# Patient Record
Sex: Female | Born: 1965
Health system: Southern US, Community
[De-identification: ages and names within clinical notes are randomized; demographics above are authoritative.]

## PROBLEM LIST (undated history)

## (undated) DIAGNOSIS — R0902 Hypoxemia: Secondary | ICD-10-CM

## (undated) DIAGNOSIS — F32A Depression, unspecified: Secondary | ICD-10-CM

## (undated) DIAGNOSIS — Z8489 Family history of other specified conditions: Secondary | ICD-10-CM

## (undated) DIAGNOSIS — E785 Hyperlipidemia, unspecified: Secondary | ICD-10-CM

## (undated) DIAGNOSIS — Z789 Other specified health status: Secondary | ICD-10-CM

## (undated) DIAGNOSIS — R002 Palpitations: Secondary | ICD-10-CM

## (undated) DIAGNOSIS — K7460 Unspecified cirrhosis of liver: Secondary | ICD-10-CM

## (undated) DIAGNOSIS — I10 Essential (primary) hypertension: Secondary | ICD-10-CM

## (undated) DIAGNOSIS — F419 Anxiety disorder, unspecified: Secondary | ICD-10-CM

## (undated) DIAGNOSIS — I499 Cardiac arrhythmia, unspecified: Secondary | ICD-10-CM

## (undated) DIAGNOSIS — S92902A Unspecified fracture of left foot, initial encounter for closed fracture: Secondary | ICD-10-CM

## (undated) DIAGNOSIS — R062 Wheezing: Secondary | ICD-10-CM

## (undated) DIAGNOSIS — H269 Unspecified cataract: Secondary | ICD-10-CM

## (undated) DIAGNOSIS — F329 Major depressive disorder, single episode, unspecified: Secondary | ICD-10-CM

## (undated) DIAGNOSIS — R51 Headache: Secondary | ICD-10-CM

## (undated) DIAGNOSIS — G4730 Sleep apnea, unspecified: Secondary | ICD-10-CM

## (undated) DIAGNOSIS — E119 Type 2 diabetes mellitus without complications: Secondary | ICD-10-CM

## (undated) DIAGNOSIS — R519 Headache, unspecified: Secondary | ICD-10-CM

## (undated) DIAGNOSIS — G709 Myoneural disorder, unspecified: Secondary | ICD-10-CM

## (undated) DIAGNOSIS — R609 Edema, unspecified: Secondary | ICD-10-CM

## (undated) HISTORY — DX: Unspecified fracture of left foot, initial encounter for closed fracture: S92.902A

## (undated) HISTORY — DX: Unspecified cataract: H26.9

## (undated) HISTORY — DX: Hypoxemia: R09.02

## (undated) HISTORY — DX: Anxiety disorder, unspecified: F41.9

## (undated) HISTORY — PX: OTHER SURGICAL HISTORY: SHX169

## (undated) HISTORY — PX: ABDOMINAL HYSTERECTOMY: SHX81

## (undated) HISTORY — DX: Hyperlipidemia, unspecified: E78.5

## (undated) HISTORY — PX: COLONOSCOPY: SHX174

---

## 2001-06-16 ENCOUNTER — Encounter: Payer: Self-pay | Admitting: Internal Medicine

## 2003-06-13 ENCOUNTER — Encounter: Payer: Self-pay | Admitting: Internal Medicine

## 2004-06-30 ENCOUNTER — Emergency Department: Payer: Self-pay | Admitting: Emergency Medicine

## 2004-06-30 ENCOUNTER — Other Ambulatory Visit: Payer: Self-pay

## 2004-07-01 ENCOUNTER — Ambulatory Visit: Payer: Self-pay | Admitting: Internal Medicine

## 2004-09-25 ENCOUNTER — Ambulatory Visit: Payer: Self-pay | Admitting: Internal Medicine

## 2004-09-28 ENCOUNTER — Emergency Department: Payer: Self-pay | Admitting: Emergency Medicine

## 2004-09-29 ENCOUNTER — Ambulatory Visit: Payer: Self-pay | Admitting: Emergency Medicine

## 2004-09-29 ENCOUNTER — Ambulatory Visit: Payer: Self-pay | Admitting: Internal Medicine

## 2004-09-30 ENCOUNTER — Ambulatory Visit: Payer: Self-pay | Admitting: Internal Medicine

## 2004-10-01 ENCOUNTER — Ambulatory Visit: Payer: Self-pay | Admitting: Internal Medicine

## 2004-10-02 ENCOUNTER — Ambulatory Visit: Payer: Self-pay | Admitting: Internal Medicine

## 2004-10-03 ENCOUNTER — Ambulatory Visit: Payer: Self-pay | Admitting: Gastroenterology

## 2004-10-06 ENCOUNTER — Ambulatory Visit: Payer: Self-pay | Admitting: Gastroenterology

## 2004-10-06 ENCOUNTER — Inpatient Hospital Stay (HOSPITAL_COMMUNITY): Admission: AD | Admit: 2004-10-06 | Discharge: 2004-10-09 | Payer: Self-pay | Admitting: Internal Medicine

## 2004-10-08 ENCOUNTER — Ambulatory Visit: Payer: Self-pay | Admitting: Internal Medicine

## 2004-10-14 ENCOUNTER — Ambulatory Visit: Payer: Self-pay | Admitting: Internal Medicine

## 2004-10-21 ENCOUNTER — Ambulatory Visit: Payer: Self-pay | Admitting: Gastroenterology

## 2004-10-21 ENCOUNTER — Ambulatory Visit: Payer: Self-pay | Admitting: Internal Medicine

## 2004-10-23 ENCOUNTER — Ambulatory Visit: Payer: Self-pay | Admitting: Gastroenterology

## 2004-10-23 ENCOUNTER — Encounter (INDEPENDENT_AMBULATORY_CARE_PROVIDER_SITE_OTHER): Payer: Self-pay

## 2004-10-23 ENCOUNTER — Encounter (INDEPENDENT_AMBULATORY_CARE_PROVIDER_SITE_OTHER): Payer: Self-pay | Admitting: *Deleted

## 2004-10-24 ENCOUNTER — Ambulatory Visit (HOSPITAL_COMMUNITY): Admission: RE | Admit: 2004-10-24 | Discharge: 2004-10-24 | Payer: Self-pay | Admitting: Gastroenterology

## 2004-10-28 ENCOUNTER — Ambulatory Visit: Payer: Self-pay | Admitting: Internal Medicine

## 2004-12-02 ENCOUNTER — Ambulatory Visit: Payer: Self-pay | Admitting: Gastroenterology

## 2005-01-05 ENCOUNTER — Ambulatory Visit: Payer: Self-pay | Admitting: Internal Medicine

## 2005-03-06 ENCOUNTER — Ambulatory Visit: Payer: Self-pay | Admitting: Internal Medicine

## 2005-05-19 ENCOUNTER — Ambulatory Visit: Payer: Self-pay | Admitting: Family Medicine

## 2005-07-21 ENCOUNTER — Ambulatory Visit: Payer: Self-pay | Admitting: Internal Medicine

## 2005-11-11 ENCOUNTER — Ambulatory Visit: Payer: Self-pay | Admitting: Internal Medicine

## 2005-11-19 ENCOUNTER — Ambulatory Visit: Payer: Self-pay | Admitting: Internal Medicine

## 2005-12-04 ENCOUNTER — Ambulatory Visit: Payer: Self-pay | Admitting: Internal Medicine

## 2006-01-06 ENCOUNTER — Ambulatory Visit: Payer: Self-pay | Admitting: Internal Medicine

## 2006-03-25 ENCOUNTER — Ambulatory Visit: Payer: Self-pay | Admitting: Internal Medicine

## 2006-07-26 ENCOUNTER — Ambulatory Visit: Payer: Self-pay | Admitting: Internal Medicine

## 2007-02-03 ENCOUNTER — Encounter (INDEPENDENT_AMBULATORY_CARE_PROVIDER_SITE_OTHER): Payer: Self-pay | Admitting: *Deleted

## 2007-02-04 ENCOUNTER — Ambulatory Visit: Payer: Self-pay | Admitting: Internal Medicine

## 2007-02-04 DIAGNOSIS — J019 Acute sinusitis, unspecified: Secondary | ICD-10-CM | POA: Insufficient documentation

## 2007-02-04 DIAGNOSIS — E119 Type 2 diabetes mellitus without complications: Secondary | ICD-10-CM | POA: Insufficient documentation

## 2007-02-04 DIAGNOSIS — E1169 Type 2 diabetes mellitus with other specified complication: Secondary | ICD-10-CM | POA: Insufficient documentation

## 2007-02-04 DIAGNOSIS — R1011 Right upper quadrant pain: Secondary | ICD-10-CM | POA: Insufficient documentation

## 2007-02-04 DIAGNOSIS — E785 Hyperlipidemia, unspecified: Secondary | ICD-10-CM | POA: Insufficient documentation

## 2007-02-07 ENCOUNTER — Encounter: Payer: Self-pay | Admitting: Internal Medicine

## 2007-02-08 ENCOUNTER — Emergency Department: Payer: Self-pay | Admitting: Emergency Medicine

## 2007-02-08 ENCOUNTER — Telehealth: Payer: Self-pay | Admitting: Internal Medicine

## 2007-02-14 ENCOUNTER — Ambulatory Visit: Payer: Self-pay | Admitting: Emergency Medicine

## 2007-05-02 ENCOUNTER — Encounter: Payer: Self-pay | Admitting: Internal Medicine

## 2007-05-02 DIAGNOSIS — Z862 Personal history of diseases of the blood and blood-forming organs and certain disorders involving the immune mechanism: Secondary | ICD-10-CM | POA: Insufficient documentation

## 2007-05-02 DIAGNOSIS — K5900 Constipation, unspecified: Secondary | ICD-10-CM

## 2007-05-02 DIAGNOSIS — L408 Other psoriasis: Secondary | ICD-10-CM | POA: Insufficient documentation

## 2007-05-02 DIAGNOSIS — Z8639 Personal history of other endocrine, nutritional and metabolic disease: Secondary | ICD-10-CM

## 2007-05-02 DIAGNOSIS — D259 Leiomyoma of uterus, unspecified: Secondary | ICD-10-CM | POA: Insufficient documentation

## 2007-05-02 DIAGNOSIS — Z85858 Personal history of malignant neoplasm of other endocrine glands: Secondary | ICD-10-CM | POA: Insufficient documentation

## 2007-05-02 DIAGNOSIS — F32A Depression, unspecified: Secondary | ICD-10-CM | POA: Insufficient documentation

## 2007-05-02 DIAGNOSIS — G43909 Migraine, unspecified, not intractable, without status migrainosus: Secondary | ICD-10-CM | POA: Insufficient documentation

## 2007-05-02 DIAGNOSIS — F329 Major depressive disorder, single episode, unspecified: Secondary | ICD-10-CM | POA: Insufficient documentation

## 2007-05-02 DIAGNOSIS — K589 Irritable bowel syndrome without diarrhea: Secondary | ICD-10-CM | POA: Insufficient documentation

## 2007-05-02 DIAGNOSIS — I10 Essential (primary) hypertension: Secondary | ICD-10-CM | POA: Insufficient documentation

## 2007-05-02 DIAGNOSIS — K5909 Other constipation: Secondary | ICD-10-CM | POA: Insufficient documentation

## 2007-05-02 DIAGNOSIS — K649 Unspecified hemorrhoids: Secondary | ICD-10-CM | POA: Insufficient documentation

## 2007-07-18 ENCOUNTER — Telehealth (INDEPENDENT_AMBULATORY_CARE_PROVIDER_SITE_OTHER): Payer: Self-pay | Admitting: *Deleted

## 2007-07-19 ENCOUNTER — Emergency Department: Payer: Self-pay | Admitting: Emergency Medicine

## 2007-07-19 ENCOUNTER — Ambulatory Visit: Payer: Self-pay | Admitting: Family Medicine

## 2007-07-19 DIAGNOSIS — E860 Dehydration: Secondary | ICD-10-CM | POA: Insufficient documentation

## 2007-07-19 DIAGNOSIS — K5289 Other specified noninfective gastroenteritis and colitis: Secondary | ICD-10-CM | POA: Insufficient documentation

## 2007-08-17 ENCOUNTER — Ambulatory Visit: Payer: Self-pay | Admitting: Internal Medicine

## 2007-08-19 LAB — CONVERTED CEMR LAB
AST: 88 units/L — ABNORMAL HIGH (ref 0–37)
BUN: 8 mg/dL (ref 6–23)
Basophils Absolute: 0 10*3/uL (ref 0.0–0.1)
Basophils Relative: 0.5 % (ref 0.0–1.0)
Bilirubin, Direct: 0.1 mg/dL (ref 0.0–0.3)
CO2: 28 meq/L (ref 19–32)
Cholesterol: 202 mg/dL (ref 0–200)
Creatinine, Ser: 0.5 mg/dL (ref 0.4–1.2)
Direct LDL: 128.1 mg/dL
Eosinophils Absolute: 0.2 10*3/uL (ref 0.0–0.6)
Eosinophils Relative: 3.8 % (ref 0.0–5.0)
GFR calc Af Amer: 174 mL/min
HDL: 27.4 mg/dL — ABNORMAL LOW (ref >39.0)
Hemoglobin: 14.6 g/dL (ref 12.0–15.0)
Lymphocytes Relative: 23.3 % (ref 12.0–46.0)
MCV: 89.2 fL (ref 78.0–100.0)
Monocytes Absolute: 0.5 10*3/uL (ref 0.2–0.7)
Monocytes Relative: 7.4 % (ref 3.0–11.0)
Neutro Abs: 4.2 10*3/uL (ref 1.4–7.7)
Phosphorus: 3.4 mg/dL (ref 2.3–4.6)
Potassium: 4.2 meq/L (ref 3.5–5.1)
Sodium: 137 meq/L (ref 135–145)
TSH: 0.87 microintl units/mL (ref 0.35–5.50)
Total CHOL/HDL Ratio: 7.4

## 2008-03-05 ENCOUNTER — Telehealth: Payer: Self-pay | Admitting: Family Medicine

## 2008-12-27 ENCOUNTER — Encounter: Payer: Self-pay | Admitting: Gastroenterology

## 2009-01-04 ENCOUNTER — Encounter: Payer: Self-pay | Admitting: Gastroenterology

## 2009-01-10 ENCOUNTER — Encounter: Payer: Self-pay | Admitting: Gastroenterology

## 2009-02-18 ENCOUNTER — Ambulatory Visit: Payer: Self-pay | Admitting: Gastroenterology

## 2009-02-18 DIAGNOSIS — R112 Nausea with vomiting, unspecified: Secondary | ICD-10-CM | POA: Insufficient documentation

## 2009-02-18 DIAGNOSIS — R1115 Cyclical vomiting syndrome unrelated to migraine: Secondary | ICD-10-CM | POA: Insufficient documentation

## 2009-02-18 DIAGNOSIS — K219 Gastro-esophageal reflux disease without esophagitis: Secondary | ICD-10-CM | POA: Insufficient documentation

## 2009-02-18 DIAGNOSIS — R079 Chest pain, unspecified: Secondary | ICD-10-CM | POA: Insufficient documentation

## 2009-03-01 ENCOUNTER — Ambulatory Visit: Payer: Self-pay | Admitting: Gastroenterology

## 2009-03-01 ENCOUNTER — Encounter: Payer: Self-pay | Admitting: Gastroenterology

## 2009-03-04 ENCOUNTER — Telehealth (INDEPENDENT_AMBULATORY_CARE_PROVIDER_SITE_OTHER): Payer: Self-pay

## 2009-03-04 DIAGNOSIS — R1310 Dysphagia, unspecified: Secondary | ICD-10-CM | POA: Insufficient documentation

## 2009-03-05 ENCOUNTER — Encounter: Payer: Self-pay | Admitting: Gastroenterology

## 2009-03-15 ENCOUNTER — Ambulatory Visit (HOSPITAL_COMMUNITY): Admission: RE | Admit: 2009-03-15 | Discharge: 2009-03-15 | Payer: Self-pay | Admitting: Gastroenterology

## 2009-03-25 ENCOUNTER — Encounter (INDEPENDENT_AMBULATORY_CARE_PROVIDER_SITE_OTHER): Payer: Self-pay

## 2009-03-25 ENCOUNTER — Telehealth (INDEPENDENT_AMBULATORY_CARE_PROVIDER_SITE_OTHER): Payer: Self-pay

## 2009-04-16 ENCOUNTER — Ambulatory Visit: Payer: Self-pay | Admitting: Gastroenterology

## 2009-12-31 ENCOUNTER — Encounter (INDEPENDENT_AMBULATORY_CARE_PROVIDER_SITE_OTHER): Payer: Self-pay | Admitting: *Deleted

## 2010-06-15 ENCOUNTER — Encounter: Payer: Self-pay | Admitting: Gastroenterology

## 2010-06-26 NOTE — Letter (Signed)
Summary: Anabel Halon letter  [place] [facility]  [address] [place], [place] [address]   Phone: [phone]  Fax: [phone]       12/31/2009 MRN: [id number]  [place],   [id number]  Dear Ms. Anola Gurney Juana Di­az, and [place] announce the retirement of Modesto Charon, M.D., from full-time practice at the [facility] office effective November 21, 2009 and his plans of returning part-time.  It is important to Dr. Council Mechanic and to our practice that you understand that [facility] has seven physicians in our office for your health care needs.  We will continue to offer the same exceptional care that you have today.    Dr. Council Mechanic has spoken to many of you about his plans for retirement and returning part-time in the fall.   We will continue to work with you through the transition to schedule appointments for you in the office and meet the high standards that [facility] is committed to.   Again, it is with great pleasure that we share the news that Dr. Council Mechanic will return to [facility] at [facility] in October of 2011 with a reduced schedule.    If you have any questions, or would like to request an appointment with one of our physicians, please call us at [phone] and press the option for Scheduling an appointment.  We take pleasure in providing you with excellent patient care and look forward to seeing you at your next office visit.  [facility] are:  Viviana Simpler, M.D. Teresa Pelton, M.D. Loura Pardon, M.D. Eliezer Lofts, M.D. Owens Loffler, M.D. Arnette Norris, M.D. We proudly welcomed Renford Dills, M.D. and Ria Bush, M.D. to the practice in July/August 2011.  Sincerely,  [facility]

## 2010-08-28 LAB — GLUCOSE, CAPILLARY: Glucose-Capillary: 247 mg/dL — ABNORMAL HIGH (ref 70–99)

## 2010-10-10 NOTE — H&P (Signed)
NAME:  Kim Hicks, Kim Hicks NO.:  [id number]   MEDICAL RECORD NO.:  [id number]          PATIENT TYPE:  INP   LOCATION:  5023                         FACILITY:  [facility]   PHYSICIAN:  Drema Pry, D.O. [facility]   DATE OF BIRTH:  08-13-1965   DATE OF ADMISSION:  10/06/2004  DATE OF DISCHARGE:                                HISTORY & PHYSICAL   PRIMARY CARE PHYSICIAN:  Dr. Viviana Simpler.   CHIEF COMPLAINT:  Persistent epigastric pain, nausea, and vomiting.   HISTORY OF PRESENT ILLNESS:  The patient is a 45 year old white female with  a past medical history of diabetes times nine years who has had recent  difficulty with epigastric and diarrhea. The patient was evaluated by Dr.  Fuller Plan and states that she has had acute onset of epigastric and also right  upper quadrant pain associated with diarrhea and some nausea since Saturday  night of one week ago. The patient states that she denies any unusual food  intake, but each time she takes anything p.o. she has crampy abdominal pain  and watery nonbloody diarrhea. The patient normally is known to be  chronically constipated. The patient denies any particular GU symptoms  including vaginal discharge, but states that she did have vaginal itching  and frequent urination one week prior to her symptoms. The patient is known  to be a diabetic and within the past three weeks she states that she has  significantly changed her diet, decreasing her carbohydrate intake and  increasing her intake of protein. She has lost approximately 10-15 pounds.  There has been no recent foreign travel. She was seen at [facility] due to her acute and fairly severe symptoms. At that time the  patient had a urine pregnancy test that was negative, a urinalysis that was  essentially unremarkable, and an abdominal ultrasound that only showed mild  enlargement of her spleen. A subsequent HIDA scan was performed which was  negative due to positive  Murphy sign during ultrasound. The patient had  persistent symptoms and then underwent CAT scan of her abdomen and pelvis  without intravenous contrast which was entirely unremarkable. During this  time period the patient states that she has had uncontrolled blood sugars  approximately in the 200s and prior to this episode blood sugars have been  fairly low in the 100s. However it should be mentioned that the patient has  had persistent problem and she may not have been able to take her diabetes  medications. In addition, during this similar time period, the patient has  had a rash underneath both her breasts, which she is currently being worked  up by Dr. Silvio Pate.   PAST MEDICAL HISTORY:  1.  Diabetes mellitus times nine years.  2.  Hypertension for six years.  3.  Chronic headaches/allergy symptoms.  4.  She is status post hysterectomy in 1999 for endometriosis.   CURRENT MEDICATIONS:  1.  Glipizide ER 10 mg once a day.  2.  Metformin 1000 mg b.i.d. Of note Metformin was DC'd during her CAT scan.  3.  Lisinopril 20 mg once a day.  4.  Phenergan p.r.n.   ALLERGIES:  TALWIN.   SOCIAL HISTORY:  She is married, has one child.  Currently lives with her  husband. The patient is a Optometrist for [employer].  Denies any tobacco or alcohol usage.   REVIEW OF SYSTEMS:  No fever or chills. No HEENT symptoms. Positive back  pain. No chest pain or shortness of breath. GI symptoms as described above.  The patient denies being sexually active in the recent past and all other  systems are negative   PHYSICAL EXAMINATION:  GENERAL: The patient is an obese, 45 year old white  female in no apparent distress.  VITAL SIGNS: On admission temperature 98.3, pulse 78, respirations 18, blood  pressure 130/76. Blood sugar was 223 and patient was 96% on room air.  HEENT: The patient is anicteric. Oropharynx is within normal limits. Mucous  membranes are moist. The patient had bull  neck. No adenopathy or  thyromegaly.  LUNGS: Clear to auscultation bilaterally. No rales, rhonchi, or wheezes. The  patient has normal respiratory effort.  CARDIAC: Regular rate and rhythm without murmurs, rubs, or gallops.  ABDOMEN: Soft. The patient had diffuse abdominal tenderness, but no rebound  or guarding. Unable to appreciate organomegaly, masses, or hernias. Normal  bowel sounds.  RECTAL EXAM: Deferred.  EXTREMITIES: No clubbing, cyanosis, or edema.  NEUROLOGIC: Cranial nerves II-XII grossly intact. No focal deficits.  SKIN: Macular erythematous patchy eruption below her breasts bilaterally.   LABORATORY DATA:  As of Oct 03, 2004, CBC showed WBC of 7.0, H&H 14.5/42.5,  platelet count 201,000. A comprehensive metabolic profile was essentially  normal with the exception of AST which is slightly elevated at 65, ALT 83.  Amylase and lipase within normal limits.   ASSESSMENT/PLAN:  1.  Abdominal pain with nausea and diarrhea.  2.  Dermatitis.  3.  Type 2 diabetes.  4.  Hypertension.   RECOMMENDATIONS:  I suspect that the patient's symptoms are most consistent  with gastroenteritis, hopefully it is resolving. The patient has had  multiple studies so far that has been negative. We will consider repeat CAT  scan of the abdomen and pelvis without contrast. We will also check stool  studies for Clostridium difficile as well as cultures and O&P. With elevated  LFTs, we will also rule out acute hepatitis and also order a CMV and Epstein-  Barr viral titers. With a rash there is a remote possibility that this may  be related to celiac sprue and the rash represents dermatitis piriformis,  but I doubt this. I will discuss this with GI whether celiac panel may be  warranted.   In terms of her rash, I believe that it is an intertriginous rash from  candida and the patient will be started on Nystatin cream t.i.d.  In respect to her diabetes, the patient's poor control may be due to her   poor intake and vomiting. The patient will be temporarily started on Lantus  10 units, put on a sliding scale q.i.d.   In terms of her hypertension, she is well controlled and will be maintained  lisinopril. Dr. Fuller Plan will also follow the patient during hospitalization  and evaluate with either a colonoscopy or other study may be helpful.      RY/MEDQ  D:  10/06/2004  T:  10/06/2004  Job:  [id number]   cc:   Viviana Simpler, M.D.

## 2010-10-10 NOTE — Discharge Summary (Signed)
NAME:  Kim Hicks, Kim Hicks NO.:  [id number]   MEDICAL RECORD NO.:  [id number]          PATIENT TYPE:  INP   LOCATION:  5023                         FACILITY:  [facility]   PHYSICIAN:  Drema Pry, D.O. [facility]   DATE OF BIRTH:  04/08/1966   DATE OF ADMISSION:  10/06/2004  DATE OF DISCHARGE:  10/09/2004                                 DISCHARGE SUMMARY   PRIMARY CARE PHYSICIAN:  Viviana Simpler, M.D.   DISCHARGE DIAGNOSES:  1.  Abdominal pain with nausea and vomiting, presumed viral gastroenteritis.  2.  Diabetes mellitus type 2.  3.  Hypertension.   DISCHARGE MEDICATIONS:  1.  Glipizide ER 5 mg once a day before a.m. meal.  2.  Lisinopril 20 mg once a day.  3.  Protonix 40 mg once a day.  4.  Reglan 5 mg #90 one tab before meals.  5.  Spectazole cream apply twice daily underneath breasts.  6.  Hold Glucophage until nausea/abdominal pain completely resolved.   DIET:  She is to follow a bland diet x3-5 days.   FOLLOWUP:  Followup instructions were given for the patient to follow up  with Dr. Silvio Pate in one week.  She is to call the office for an appointment.   PROCEDURES DURING HOSPITALIZATION:  The patient underwent EGD performed by  Dr. Henrene Pastor which showed mild esophageal inflammation, classification grade 0.  The patient was started on Protonix.   HOSPITAL COURSE:  The patient is a 45 year old white female with past  medical history of diabetes who has had recurrent episode of nausea and  vomiting for approximately 1-1/2 weeks.  Was evaluated at [facility]  with a negative HIDA scan, negative ultrasound and also a negative CAT scan  of her abdomen but due to recurrent nausea and vomiting  the patient was  admitted for further evaluation.  The patient had a repeat CAT scan at [facility] which did not show any acute abnormalities besides a 2 cm  right ovarian cyst which is likely physiologic.  It should also be noted  that she had a negative pregnancy  test at [facility].  In addition,  we reviewed her gynecologic history as she has not been sexually active in  some time and we felt that a gynecologic source of abdominal pain was  unlikely.  The patient started to improve on the day before where the  patient was starting to tolerate p.o.  The patient was started on Protonix  as well as Reglan 5 mg four times a day.   PROBLEM LIST:  1.  Abdominal pain, likely viral gastroenteritis, improved with Reglan.  The      patient is to followup with Dr. Silvio Pate in one week to follow      progression.  The patient was asked to hold her Glucophage until nausea      and abdominal pain were completely resolved.  2.  Hypertension.  Remained stable.  3.  Diabetes.  The patient was treated with Lantus 10 units during her      hospitalization.  The patient  was to use her Glipizide ER.  If the      patient has hypoglycemic episodes she is to call Dr. Alla German office.  4.  The patient had intertriginous rash underneath her breasts which was      felt likely to be Candida and was started on Spectazole cream to be      applied twice a day.   DISPOSITION:  The patient's prognosis is fair.      RY/MEDQ  D:  10/09/2004  T:  10/09/2004  Job:  [id number]   cc:   Viviana Simpler, M.D.

## 2010-10-16 ENCOUNTER — Ambulatory Visit: Payer: Self-pay

## 2010-10-28 ENCOUNTER — Ambulatory Visit: Payer: Self-pay | Admitting: Unknown Physician Specialty

## 2011-02-09 ENCOUNTER — Inpatient Hospital Stay: Payer: Self-pay | Admitting: Psychiatry

## 2011-03-25 DIAGNOSIS — F333 Major depressive disorder, recurrent, severe with psychotic symptoms: Secondary | ICD-10-CM | POA: Insufficient documentation

## 2012-11-08 ENCOUNTER — Ambulatory Visit: Payer: Self-pay | Admitting: Specialist

## 2012-12-14 ENCOUNTER — Ambulatory Visit: Payer: Self-pay | Admitting: Specialist

## 2012-12-14 LAB — BASIC METABOLIC PANEL
Anion Gap: 6 — ABNORMAL LOW (ref 7–16)
BUN: 17 mg/dL (ref 7–18)
Calcium, Total: 9.3 mg/dL (ref 8.5–10.1)
EGFR (African American): >60
EGFR (Non-African Amer.): >60
Glucose: 233 mg/dL — ABNORMAL HIGH (ref 65–99)
Osmolality: 283 (ref 275–301)

## 2012-12-14 LAB — HEMOGLOBIN: HGB: 14.7 g/dL (ref 12.0–16.0)

## 2013-01-17 ENCOUNTER — Ambulatory Visit: Payer: Self-pay | Admitting: Specialist

## 2014-02-19 ENCOUNTER — Encounter: Payer: Self-pay | Admitting: Gastroenterology

## 2014-06-22 DIAGNOSIS — F312 Bipolar disorder, current episode manic severe with psychotic features: Secondary | ICD-10-CM | POA: Diagnosis not present

## 2014-09-14 NOTE — Op Note (Signed)
PATIENT NAME:  Kim Hicks, Kim Hicks MR#:  [id number] DATE OF BIRTH:  Jul 17, 1965  DATE OF PROCEDURE:  01/17/2013  PREOPERATIVE DIAGNOSIS: 1.  Mild degenerative arthritis of acromioclavicular joint left shoulder.  2.  Severe impingement syndrome, left shoulder.   POSTOPERATIVE DIAGNOSES: 1.  Mild degenerative arthritis of acromioclavicular joint left shoulder.  2.  Severe impingement syndrome, left shoulder.   PROCEDURE:  1.  Arthroscopic partial claviculectomy left shoulder.  2.  Arthroscopic debridement and anterior acromioplasty, left shoulder.   SURGEON: Christophe Louis, M.D.   ANESTHESIA: General.   COMPLICATIONS: None.   ESTIMATED BLOOD LOSS: 50 mL.   DESCRIPTION OF PROCEDURE: Ancef 1 gram was given intravenously prior to the procedure. General anesthesia was induced. The patient is placed into the right lateral decubitus position in the usual manner for left shoulder surgery. The left shoulder is thoroughly prepped with alcohol and ChloraPrep and draped in standard sterile fashion. Traction is applied on the arm with 12 pounds longitudinal traction at about 15 degrees of abduction and 15 degrees of forward flexion. The arthroscope is inserted into the subacromial bursa and outflow is maintained anteriorly. There is seen to be moderate to severe hypertrophic synovitis present, and the full radial resector is used to remove the material, such that the rotator cuff and distal clavicle and anterior acromion can be visualized. The rotator cuff is seen to have fraying but there is no full thickness tear seen on thorough inspection. The full radial resector is used to remove the soft tissue from the undersurface of the clavicle and the undersurface of the large anterior acromial spur which is seen to be present. ArthroWand is used for further removal of the soft tissue. The acromionizer is then used to remove hypertrophic osteophyte from the inferior surface of the distal clavicle and then anterior  acromioplasty is performed using standard technique back to a normal anatomic position. Range of motion of the shoulder demonstrated excellent space for the rotator cuff.  The bursa is thoroughly irrigated multiple times. Skin edges are closed with 5-0 nylon. A soft bulky dressing is applied. The subacromial bursa had previously been injected with 25 mL of Marcaine with epinephrine and 4 mg of morphine. A sling is applied. The patient is returned to the recovery room in satisfactory condition having tolerated the procedure quite well.     ____________________________ Lucas Mallow, MD ces:dp D: 01/18/2013 07:35:58 ET T: 01/18/2013 08:53:38 ET JOB#: [id number]  cc: Lucas Mallow, MD, <Dictator> Lucas Mallow MD ELECTRONICALLY SIGNED 01/25/2013 6:48

## 2014-09-17 DIAGNOSIS — F312 Bipolar disorder, current episode manic severe with psychotic features: Secondary | ICD-10-CM | POA: Diagnosis not present

## 2014-09-30 DIAGNOSIS — J018 Other acute sinusitis: Secondary | ICD-10-CM | POA: Diagnosis not present

## 2014-09-30 DIAGNOSIS — J069 Acute upper respiratory infection, unspecified: Secondary | ICD-10-CM | POA: Diagnosis not present

## 2014-10-25 DIAGNOSIS — J0100 Acute maxillary sinusitis, unspecified: Secondary | ICD-10-CM | POA: Diagnosis not present

## 2014-11-21 DIAGNOSIS — E119 Type 2 diabetes mellitus without complications: Secondary | ICD-10-CM | POA: Diagnosis not present

## 2014-11-21 DIAGNOSIS — H7419 Adhesive middle ear disease, unspecified ear: Secondary | ICD-10-CM | POA: Diagnosis not present

## 2014-12-12 DIAGNOSIS — J3489 Other specified disorders of nose and nasal sinuses: Secondary | ICD-10-CM | POA: Diagnosis not present

## 2014-12-12 DIAGNOSIS — J329 Chronic sinusitis, unspecified: Secondary | ICD-10-CM | POA: Diagnosis not present

## 2014-12-12 DIAGNOSIS — J342 Deviated nasal septum: Secondary | ICD-10-CM | POA: Diagnosis not present

## 2014-12-12 DIAGNOSIS — H9191 Unspecified hearing loss, right ear: Secondary | ICD-10-CM | POA: Diagnosis not present

## 2014-12-12 DIAGNOSIS — J351 Hypertrophy of tonsils: Secondary | ICD-10-CM | POA: Diagnosis not present

## 2014-12-12 DIAGNOSIS — H6521 Chronic serous otitis media, right ear: Secondary | ICD-10-CM | POA: Diagnosis not present

## 2014-12-12 DIAGNOSIS — R0981 Nasal congestion: Secondary | ICD-10-CM | POA: Diagnosis not present

## 2014-12-24 DIAGNOSIS — F312 Bipolar disorder, current episode manic severe with psychotic features: Secondary | ICD-10-CM | POA: Diagnosis not present

## 2014-12-26 DIAGNOSIS — J0130 Acute sphenoidal sinusitis, unspecified: Secondary | ICD-10-CM | POA: Diagnosis not present

## 2014-12-26 DIAGNOSIS — J342 Deviated nasal septum: Secondary | ICD-10-CM | POA: Diagnosis not present

## 2014-12-26 DIAGNOSIS — J329 Chronic sinusitis, unspecified: Secondary | ICD-10-CM | POA: Diagnosis not present

## 2014-12-31 DIAGNOSIS — H6981 Other specified disorders of Eustachian tube, right ear: Secondary | ICD-10-CM | POA: Diagnosis not present

## 2014-12-31 DIAGNOSIS — R51 Headache: Secondary | ICD-10-CM | POA: Diagnosis not present

## 2014-12-31 DIAGNOSIS — H6521 Chronic serous otitis media, right ear: Secondary | ICD-10-CM | POA: Diagnosis not present

## 2015-01-16 DIAGNOSIS — E0590 Thyrotoxicosis, unspecified without thyrotoxic crisis or storm: Secondary | ICD-10-CM | POA: Diagnosis not present

## 2015-01-16 DIAGNOSIS — H908 Mixed conductive and sensorineural hearing loss, unspecified: Secondary | ICD-10-CM | POA: Diagnosis not present

## 2015-01-16 DIAGNOSIS — H6981 Other specified disorders of Eustachian tube, right ear: Secondary | ICD-10-CM | POA: Diagnosis not present

## 2015-01-16 DIAGNOSIS — Z794 Long term (current) use of insulin: Secondary | ICD-10-CM | POA: Diagnosis not present

## 2015-01-16 DIAGNOSIS — Z79899 Other long term (current) drug therapy: Secondary | ICD-10-CM | POA: Diagnosis not present

## 2015-01-16 DIAGNOSIS — E119 Type 2 diabetes mellitus without complications: Secondary | ICD-10-CM | POA: Diagnosis not present

## 2015-01-16 DIAGNOSIS — I10 Essential (primary) hypertension: Secondary | ICD-10-CM | POA: Diagnosis not present

## 2015-01-16 DIAGNOSIS — H6521 Chronic serous otitis media, right ear: Secondary | ICD-10-CM | POA: Diagnosis not present

## 2015-01-16 DIAGNOSIS — H6531 Chronic mucoid otitis media, right ear: Secondary | ICD-10-CM | POA: Diagnosis not present

## 2015-01-30 DIAGNOSIS — Z8709 Personal history of other diseases of the respiratory system: Secondary | ICD-10-CM | POA: Diagnosis not present

## 2015-01-30 DIAGNOSIS — H6691 Otitis media, unspecified, right ear: Secondary | ICD-10-CM | POA: Diagnosis not present

## 2015-01-30 DIAGNOSIS — J310 Chronic rhinitis: Secondary | ICD-10-CM | POA: Diagnosis not present

## 2015-01-30 DIAGNOSIS — J342 Deviated nasal septum: Secondary | ICD-10-CM | POA: Diagnosis not present

## 2015-01-31 DIAGNOSIS — E1165 Type 2 diabetes mellitus with hyperglycemia: Secondary | ICD-10-CM | POA: Diagnosis not present

## 2015-01-31 DIAGNOSIS — E109 Type 1 diabetes mellitus without complications: Secondary | ICD-10-CM | POA: Diagnosis not present

## 2015-01-31 DIAGNOSIS — E119 Type 2 diabetes mellitus without complications: Secondary | ICD-10-CM | POA: Diagnosis not present

## 2015-05-10 DIAGNOSIS — F333 Major depressive disorder, recurrent, severe with psychotic symptoms: Secondary | ICD-10-CM | POA: Diagnosis not present

## 2015-05-13 DIAGNOSIS — F312 Bipolar disorder, current episode manic severe with psychotic features: Secondary | ICD-10-CM | POA: Diagnosis not present

## 2015-07-05 DIAGNOSIS — F333 Major depressive disorder, recurrent, severe with psychotic symptoms: Secondary | ICD-10-CM | POA: Diagnosis not present

## 2015-07-24 DIAGNOSIS — N1330 Unspecified hydronephrosis: Secondary | ICD-10-CM | POA: Diagnosis not present

## 2015-07-24 DIAGNOSIS — N10 Acute tubulo-interstitial nephritis: Secondary | ICD-10-CM | POA: Diagnosis not present

## 2015-07-24 DIAGNOSIS — K5730 Diverticulosis of large intestine without perforation or abscess without bleeding: Secondary | ICD-10-CM | POA: Diagnosis not present

## 2015-07-24 DIAGNOSIS — Z9089 Acquired absence of other organs: Secondary | ICD-10-CM | POA: Diagnosis not present

## 2015-07-25 DIAGNOSIS — E1142 Type 2 diabetes mellitus with diabetic polyneuropathy: Secondary | ICD-10-CM | POA: Diagnosis not present

## 2015-07-25 DIAGNOSIS — E119 Type 2 diabetes mellitus without complications: Secondary | ICD-10-CM | POA: Diagnosis not present

## 2015-07-25 DIAGNOSIS — E1165 Type 2 diabetes mellitus with hyperglycemia: Secondary | ICD-10-CM | POA: Diagnosis not present

## 2015-07-25 DIAGNOSIS — E109 Type 1 diabetes mellitus without complications: Secondary | ICD-10-CM | POA: Diagnosis not present

## 2015-07-30 DIAGNOSIS — F333 Major depressive disorder, recurrent, severe with psychotic symptoms: Secondary | ICD-10-CM | POA: Diagnosis not present

## 2015-07-30 DIAGNOSIS — F332 Major depressive disorder, recurrent severe without psychotic features: Secondary | ICD-10-CM | POA: Diagnosis not present

## 2015-08-16 DIAGNOSIS — E1165 Type 2 diabetes mellitus with hyperglycemia: Secondary | ICD-10-CM | POA: Diagnosis not present

## 2015-08-16 DIAGNOSIS — E109 Type 1 diabetes mellitus without complications: Secondary | ICD-10-CM | POA: Diagnosis not present

## 2015-08-16 DIAGNOSIS — N3090 Cystitis, unspecified without hematuria: Secondary | ICD-10-CM | POA: Diagnosis not present

## 2015-10-15 DIAGNOSIS — F332 Major depressive disorder, recurrent severe without psychotic features: Secondary | ICD-10-CM | POA: Diagnosis not present

## 2016-01-15 DIAGNOSIS — F332 Major depressive disorder, recurrent severe without psychotic features: Secondary | ICD-10-CM | POA: Diagnosis not present

## 2016-02-05 DIAGNOSIS — H1031 Unspecified acute conjunctivitis, right eye: Secondary | ICD-10-CM | POA: Diagnosis not present

## 2016-02-05 DIAGNOSIS — F329 Major depressive disorder, single episode, unspecified: Secondary | ICD-10-CM | POA: Diagnosis not present

## 2016-02-05 DIAGNOSIS — E119 Type 2 diabetes mellitus without complications: Secondary | ICD-10-CM | POA: Diagnosis not present

## 2016-02-05 DIAGNOSIS — I10 Essential (primary) hypertension: Secondary | ICD-10-CM | POA: Diagnosis not present

## 2016-04-10 DIAGNOSIS — F332 Major depressive disorder, recurrent severe without psychotic features: Secondary | ICD-10-CM | POA: Diagnosis not present

## 2016-04-24 DIAGNOSIS — E109 Type 1 diabetes mellitus without complications: Secondary | ICD-10-CM | POA: Diagnosis not present

## 2016-04-24 DIAGNOSIS — I10 Essential (primary) hypertension: Secondary | ICD-10-CM | POA: Diagnosis not present

## 2016-04-24 DIAGNOSIS — E119 Type 2 diabetes mellitus without complications: Secondary | ICD-10-CM | POA: Diagnosis not present

## 2016-04-24 DIAGNOSIS — F418 Other specified anxiety disorders: Secondary | ICD-10-CM | POA: Diagnosis not present

## 2016-06-08 DIAGNOSIS — I10 Essential (primary) hypertension: Secondary | ICD-10-CM | POA: Diagnosis not present

## 2016-06-08 DIAGNOSIS — E034 Atrophy of thyroid (acquired): Secondary | ICD-10-CM | POA: Diagnosis not present

## 2016-06-08 DIAGNOSIS — R5381 Other malaise: Secondary | ICD-10-CM | POA: Diagnosis not present

## 2016-06-08 DIAGNOSIS — E784 Other hyperlipidemia: Secondary | ICD-10-CM | POA: Diagnosis not present

## 2016-06-08 DIAGNOSIS — E119 Type 2 diabetes mellitus without complications: Secondary | ICD-10-CM | POA: Diagnosis not present

## 2016-06-12 DIAGNOSIS — J218 Acute bronchiolitis due to other specified organisms: Secondary | ICD-10-CM | POA: Diagnosis not present

## 2016-06-12 DIAGNOSIS — B9789 Other viral agents as the cause of diseases classified elsewhere: Secondary | ICD-10-CM | POA: Diagnosis not present

## 2016-06-12 DIAGNOSIS — E109 Type 1 diabetes mellitus without complications: Secondary | ICD-10-CM | POA: Diagnosis not present

## 2016-06-12 DIAGNOSIS — I10 Essential (primary) hypertension: Secondary | ICD-10-CM | POA: Diagnosis not present

## 2016-07-10 DIAGNOSIS — F331 Major depressive disorder, recurrent, moderate: Secondary | ICD-10-CM | POA: Diagnosis not present

## 2016-09-15 DIAGNOSIS — B9789 Other viral agents as the cause of diseases classified elsewhere: Secondary | ICD-10-CM | POA: Diagnosis not present

## 2016-09-15 DIAGNOSIS — E1165 Type 2 diabetes mellitus with hyperglycemia: Secondary | ICD-10-CM | POA: Diagnosis not present

## 2016-09-15 DIAGNOSIS — J218 Acute bronchiolitis due to other specified organisms: Secondary | ICD-10-CM | POA: Diagnosis not present

## 2016-09-15 DIAGNOSIS — E109 Type 1 diabetes mellitus without complications: Secondary | ICD-10-CM | POA: Diagnosis not present

## 2016-09-28 DIAGNOSIS — F331 Major depressive disorder, recurrent, moderate: Secondary | ICD-10-CM | POA: Diagnosis not present

## 2016-10-07 DIAGNOSIS — F332 Major depressive disorder, recurrent severe without psychotic features: Secondary | ICD-10-CM | POA: Diagnosis not present

## 2016-10-23 DIAGNOSIS — F332 Major depressive disorder, recurrent severe without psychotic features: Secondary | ICD-10-CM | POA: Diagnosis not present

## 2016-11-20 DIAGNOSIS — F332 Major depressive disorder, recurrent severe without psychotic features: Secondary | ICD-10-CM | POA: Diagnosis not present

## 2016-12-11 DIAGNOSIS — F332 Major depressive disorder, recurrent severe without psychotic features: Secondary | ICD-10-CM | POA: Diagnosis not present

## 2016-12-28 DIAGNOSIS — F331 Major depressive disorder, recurrent, moderate: Secondary | ICD-10-CM | POA: Diagnosis not present

## 2017-03-31 DIAGNOSIS — S2020XA Contusion of thorax, unspecified, initial encounter: Secondary | ICD-10-CM | POA: Diagnosis not present

## 2017-04-19 DIAGNOSIS — F331 Major depressive disorder, recurrent, moderate: Secondary | ICD-10-CM | POA: Diagnosis not present

## 2017-05-12 DIAGNOSIS — E113393 Type 2 diabetes mellitus with moderate nonproliferative diabetic retinopathy without macular edema, bilateral: Secondary | ICD-10-CM | POA: Diagnosis not present

## 2017-06-01 ENCOUNTER — Other Ambulatory Visit: Payer: Self-pay | Admitting: Internal Medicine

## 2017-06-01 DIAGNOSIS — I10 Essential (primary) hypertension: Secondary | ICD-10-CM | POA: Diagnosis not present

## 2017-06-01 DIAGNOSIS — E109 Type 1 diabetes mellitus without complications: Secondary | ICD-10-CM | POA: Diagnosis not present

## 2017-06-01 DIAGNOSIS — R51 Headache: Principal | ICD-10-CM

## 2017-06-01 DIAGNOSIS — E119 Type 2 diabetes mellitus without complications: Secondary | ICD-10-CM | POA: Diagnosis not present

## 2017-06-01 DIAGNOSIS — R519 Headache, unspecified: Secondary | ICD-10-CM

## 2017-06-01 DIAGNOSIS — E1165 Type 2 diabetes mellitus with hyperglycemia: Secondary | ICD-10-CM | POA: Diagnosis not present

## 2017-06-05 DIAGNOSIS — J209 Acute bronchitis, unspecified: Secondary | ICD-10-CM | POA: Diagnosis not present

## 2017-06-10 ENCOUNTER — Ambulatory Visit
Admission: RE | Admit: 2017-06-10 | Discharge: 2017-06-10 | Disposition: A | Discharge status: Home / Self Care | Payer: Medicare Other | Source: Ambulatory Visit | Attending: Internal Medicine | Admitting: Internal Medicine

## 2017-06-10 DIAGNOSIS — R519 Headache, unspecified: Secondary | ICD-10-CM

## 2017-06-10 DIAGNOSIS — R51 Headache: Secondary | ICD-10-CM | POA: Diagnosis not present

## 2017-06-12 DIAGNOSIS — J0110 Acute frontal sinusitis, unspecified: Secondary | ICD-10-CM | POA: Diagnosis not present

## 2017-06-12 DIAGNOSIS — J209 Acute bronchitis, unspecified: Secondary | ICD-10-CM | POA: Diagnosis not present

## 2017-06-16 DIAGNOSIS — E119 Type 2 diabetes mellitus without complications: Secondary | ICD-10-CM | POA: Diagnosis not present

## 2017-06-16 DIAGNOSIS — R110 Nausea: Secondary | ICD-10-CM | POA: Diagnosis not present

## 2017-06-16 DIAGNOSIS — R51 Headache: Secondary | ICD-10-CM | POA: Diagnosis not present

## 2017-06-16 DIAGNOSIS — I10 Essential (primary) hypertension: Secondary | ICD-10-CM | POA: Diagnosis not present

## 2017-06-16 DIAGNOSIS — R42 Dizziness and giddiness: Secondary | ICD-10-CM | POA: Diagnosis not present

## 2017-06-16 DIAGNOSIS — R002 Palpitations: Secondary | ICD-10-CM | POA: Diagnosis not present

## 2017-07-15 DIAGNOSIS — R51 Headache: Secondary | ICD-10-CM | POA: Diagnosis not present

## 2017-07-15 DIAGNOSIS — Z79899 Other long term (current) drug therapy: Secondary | ICD-10-CM | POA: Diagnosis not present

## 2017-07-15 DIAGNOSIS — G43719 Chronic migraine without aura, intractable, without status migrainosus: Secondary | ICD-10-CM | POA: Diagnosis not present

## 2017-07-19 DIAGNOSIS — M791 Myalgia, unspecified site: Secondary | ICD-10-CM | POA: Diagnosis not present

## 2017-07-19 DIAGNOSIS — G518 Other disorders of facial nerve: Secondary | ICD-10-CM | POA: Diagnosis not present

## 2017-07-19 DIAGNOSIS — M542 Cervicalgia: Secondary | ICD-10-CM | POA: Diagnosis not present

## 2017-07-19 DIAGNOSIS — G43719 Chronic migraine without aura, intractable, without status migrainosus: Secondary | ICD-10-CM | POA: Diagnosis not present

## 2017-07-19 DIAGNOSIS — R51 Headache: Secondary | ICD-10-CM | POA: Diagnosis not present

## 2017-07-20 DIAGNOSIS — F332 Major depressive disorder, recurrent severe without psychotic features: Secondary | ICD-10-CM | POA: Diagnosis not present

## 2017-07-27 DIAGNOSIS — R748 Abnormal levels of other serum enzymes: Secondary | ICD-10-CM | POA: Diagnosis not present

## 2017-07-27 DIAGNOSIS — Z13220 Encounter for screening for lipoid disorders: Secondary | ICD-10-CM | POA: Diagnosis not present

## 2017-07-27 DIAGNOSIS — Z136 Encounter for screening for cardiovascular disorders: Secondary | ICD-10-CM | POA: Diagnosis not present

## 2017-07-27 DIAGNOSIS — Z139 Encounter for screening, unspecified: Secondary | ICD-10-CM | POA: Diagnosis not present

## 2017-07-27 DIAGNOSIS — E119 Type 2 diabetes mellitus without complications: Secondary | ICD-10-CM | POA: Diagnosis not present

## 2017-07-27 DIAGNOSIS — Z6831 Body mass index (BMI) 31.0-31.9, adult: Secondary | ICD-10-CM | POA: Diagnosis not present

## 2017-07-27 DIAGNOSIS — E782 Mixed hyperlipidemia: Secondary | ICD-10-CM | POA: Diagnosis not present

## 2017-07-27 DIAGNOSIS — I10 Essential (primary) hypertension: Secondary | ICD-10-CM | POA: Diagnosis not present

## 2017-07-27 DIAGNOSIS — R5383 Other fatigue: Secondary | ICD-10-CM | POA: Diagnosis not present

## 2017-07-27 DIAGNOSIS — E669 Obesity, unspecified: Secondary | ICD-10-CM | POA: Diagnosis not present

## 2017-07-27 DIAGNOSIS — E039 Hypothyroidism, unspecified: Secondary | ICD-10-CM | POA: Diagnosis not present

## 2017-07-27 DIAGNOSIS — Z8349 Family history of other endocrine, nutritional and metabolic diseases: Secondary | ICD-10-CM | POA: Diagnosis not present

## 2017-07-27 DIAGNOSIS — E538 Deficiency of other specified B group vitamins: Secondary | ICD-10-CM | POA: Diagnosis not present

## 2017-07-27 DIAGNOSIS — E7800 Pure hypercholesterolemia, unspecified: Secondary | ICD-10-CM | POA: Diagnosis not present

## 2017-07-27 DIAGNOSIS — D649 Anemia, unspecified: Secondary | ICD-10-CM | POA: Diagnosis not present

## 2017-08-03 DIAGNOSIS — G518 Other disorders of facial nerve: Secondary | ICD-10-CM | POA: Diagnosis not present

## 2017-08-03 DIAGNOSIS — G43719 Chronic migraine without aura, intractable, without status migrainosus: Secondary | ICD-10-CM | POA: Diagnosis not present

## 2017-08-03 DIAGNOSIS — M542 Cervicalgia: Secondary | ICD-10-CM | POA: Diagnosis not present

## 2017-08-03 DIAGNOSIS — R51 Headache: Secondary | ICD-10-CM | POA: Diagnosis not present

## 2017-08-03 DIAGNOSIS — M791 Myalgia, unspecified site: Secondary | ICD-10-CM | POA: Diagnosis not present

## 2017-08-12 DIAGNOSIS — E669 Obesity, unspecified: Secondary | ICD-10-CM | POA: Diagnosis not present

## 2017-08-12 DIAGNOSIS — Z6832 Body mass index (BMI) 32.0-32.9, adult: Secondary | ICD-10-CM | POA: Diagnosis not present

## 2017-08-12 DIAGNOSIS — E119 Type 2 diabetes mellitus without complications: Secondary | ICD-10-CM | POA: Diagnosis not present

## 2017-08-18 DIAGNOSIS — R51 Headache: Secondary | ICD-10-CM | POA: Diagnosis not present

## 2017-08-18 DIAGNOSIS — M791 Myalgia, unspecified site: Secondary | ICD-10-CM | POA: Diagnosis not present

## 2017-08-18 DIAGNOSIS — M542 Cervicalgia: Secondary | ICD-10-CM | POA: Diagnosis not present

## 2017-08-18 DIAGNOSIS — G518 Other disorders of facial nerve: Secondary | ICD-10-CM | POA: Diagnosis not present

## 2017-08-18 DIAGNOSIS — G43719 Chronic migraine without aura, intractable, without status migrainosus: Secondary | ICD-10-CM | POA: Diagnosis not present

## 2017-09-07 DIAGNOSIS — M542 Cervicalgia: Secondary | ICD-10-CM | POA: Diagnosis not present

## 2017-09-07 DIAGNOSIS — M791 Myalgia, unspecified site: Secondary | ICD-10-CM | POA: Diagnosis not present

## 2017-09-07 DIAGNOSIS — R51 Headache: Secondary | ICD-10-CM | POA: Diagnosis not present

## 2017-09-07 DIAGNOSIS — G43719 Chronic migraine without aura, intractable, without status migrainosus: Secondary | ICD-10-CM | POA: Diagnosis not present

## 2017-09-07 DIAGNOSIS — G518 Other disorders of facial nerve: Secondary | ICD-10-CM | POA: Diagnosis not present

## 2017-09-09 DIAGNOSIS — E669 Obesity, unspecified: Secondary | ICD-10-CM | POA: Diagnosis not present

## 2017-09-09 DIAGNOSIS — R5383 Other fatigue: Secondary | ICD-10-CM | POA: Diagnosis not present

## 2017-09-09 DIAGNOSIS — N390 Urinary tract infection, site not specified: Secondary | ICD-10-CM | POA: Diagnosis not present

## 2017-09-09 DIAGNOSIS — Z6832 Body mass index (BMI) 32.0-32.9, adult: Secondary | ICD-10-CM | POA: Diagnosis not present

## 2017-09-16 DIAGNOSIS — K5900 Constipation, unspecified: Secondary | ICD-10-CM | POA: Diagnosis not present

## 2017-09-20 DIAGNOSIS — J0110 Acute frontal sinusitis, unspecified: Secondary | ICD-10-CM | POA: Diagnosis not present

## 2017-09-29 DIAGNOSIS — R51 Headache: Secondary | ICD-10-CM | POA: Diagnosis not present

## 2017-09-29 DIAGNOSIS — G500 Trigeminal neuralgia: Secondary | ICD-10-CM | POA: Diagnosis not present

## 2017-09-29 DIAGNOSIS — G43719 Chronic migraine without aura, intractable, without status migrainosus: Secondary | ICD-10-CM | POA: Diagnosis not present

## 2017-09-29 DIAGNOSIS — G518 Other disorders of facial nerve: Secondary | ICD-10-CM | POA: Diagnosis not present

## 2017-09-29 DIAGNOSIS — M791 Myalgia, unspecified site: Secondary | ICD-10-CM | POA: Diagnosis not present

## 2017-09-29 DIAGNOSIS — M542 Cervicalgia: Secondary | ICD-10-CM | POA: Diagnosis not present

## 2017-09-29 DIAGNOSIS — G509 Disorder of trigeminal nerve, unspecified: Secondary | ICD-10-CM | POA: Diagnosis not present

## 2017-10-08 DIAGNOSIS — E669 Obesity, unspecified: Secondary | ICD-10-CM | POA: Diagnosis not present

## 2017-10-08 DIAGNOSIS — Z6833 Body mass index (BMI) 33.0-33.9, adult: Secondary | ICD-10-CM | POA: Diagnosis not present

## 2017-10-12 DIAGNOSIS — F332 Major depressive disorder, recurrent severe without psychotic features: Secondary | ICD-10-CM | POA: Diagnosis not present

## 2017-10-13 DIAGNOSIS — E669 Obesity, unspecified: Secondary | ICD-10-CM | POA: Diagnosis not present

## 2017-10-13 DIAGNOSIS — Z6833 Body mass index (BMI) 33.0-33.9, adult: Secondary | ICD-10-CM | POA: Diagnosis not present

## 2017-10-13 DIAGNOSIS — E118 Type 2 diabetes mellitus with unspecified complications: Secondary | ICD-10-CM | POA: Diagnosis not present

## 2017-10-20 DIAGNOSIS — M542 Cervicalgia: Secondary | ICD-10-CM | POA: Diagnosis not present

## 2017-10-20 DIAGNOSIS — R51 Headache: Secondary | ICD-10-CM | POA: Diagnosis not present

## 2017-10-20 DIAGNOSIS — G43719 Chronic migraine without aura, intractable, without status migrainosus: Secondary | ICD-10-CM | POA: Diagnosis not present

## 2017-10-20 DIAGNOSIS — M791 Myalgia, unspecified site: Secondary | ICD-10-CM | POA: Diagnosis not present

## 2017-10-20 DIAGNOSIS — G518 Other disorders of facial nerve: Secondary | ICD-10-CM | POA: Diagnosis not present

## 2017-11-04 DIAGNOSIS — R51 Headache: Secondary | ICD-10-CM | POA: Diagnosis not present

## 2017-11-04 DIAGNOSIS — M542 Cervicalgia: Secondary | ICD-10-CM | POA: Diagnosis not present

## 2017-11-04 DIAGNOSIS — G518 Other disorders of facial nerve: Secondary | ICD-10-CM | POA: Diagnosis not present

## 2017-11-04 DIAGNOSIS — M791 Myalgia, unspecified site: Secondary | ICD-10-CM | POA: Diagnosis not present

## 2017-11-04 DIAGNOSIS — G43719 Chronic migraine without aura, intractable, without status migrainosus: Secondary | ICD-10-CM | POA: Diagnosis not present

## 2017-11-08 DIAGNOSIS — N390 Urinary tract infection, site not specified: Secondary | ICD-10-CM | POA: Diagnosis not present

## 2017-11-08 DIAGNOSIS — Z6833 Body mass index (BMI) 33.0-33.9, adult: Secondary | ICD-10-CM | POA: Diagnosis not present

## 2017-11-08 DIAGNOSIS — E669 Obesity, unspecified: Secondary | ICD-10-CM | POA: Diagnosis not present

## 2017-11-08 DIAGNOSIS — R5383 Other fatigue: Secondary | ICD-10-CM | POA: Diagnosis not present

## 2017-11-18 DIAGNOSIS — R51 Headache: Secondary | ICD-10-CM | POA: Diagnosis not present

## 2017-11-18 DIAGNOSIS — G518 Other disorders of facial nerve: Secondary | ICD-10-CM | POA: Diagnosis not present

## 2017-11-18 DIAGNOSIS — M542 Cervicalgia: Secondary | ICD-10-CM | POA: Diagnosis not present

## 2017-11-18 DIAGNOSIS — G43719 Chronic migraine without aura, intractable, without status migrainosus: Secondary | ICD-10-CM | POA: Diagnosis not present

## 2017-11-18 DIAGNOSIS — M791 Myalgia, unspecified site: Secondary | ICD-10-CM | POA: Diagnosis not present

## 2017-12-07 DIAGNOSIS — E113213 Type 2 diabetes mellitus with mild nonproliferative diabetic retinopathy with macular edema, bilateral: Secondary | ICD-10-CM | POA: Diagnosis not present

## 2017-12-27 DIAGNOSIS — E113393 Type 2 diabetes mellitus with moderate nonproliferative diabetic retinopathy without macular edema, bilateral: Secondary | ICD-10-CM | POA: Diagnosis not present

## 2017-12-28 DIAGNOSIS — H25041 Posterior subcapsular polar age-related cataract, right eye: Secondary | ICD-10-CM | POA: Diagnosis not present

## 2018-01-03 ENCOUNTER — Encounter: Payer: Self-pay | Admitting: *Deleted

## 2018-01-11 ENCOUNTER — Other Ambulatory Visit: Payer: Self-pay

## 2018-01-11 ENCOUNTER — Ambulatory Visit: Payer: Medicare Other | Admitting: Anesthesiology

## 2018-01-11 ENCOUNTER — Encounter
Admission: RE | Disposition: A | Discharge status: Home / Self Care | Payer: Self-pay | Source: Ambulatory Visit | Attending: Ophthalmology

## 2018-01-11 ENCOUNTER — Encounter: Payer: Self-pay | Admitting: Ophthalmology

## 2018-01-11 ENCOUNTER — Ambulatory Visit
Admission: RE | Admit: 2018-01-11 | Discharge: 2018-01-11 | Disposition: A | Discharge status: Home / Self Care | Payer: Medicare Other | Source: Ambulatory Visit | Attending: Ophthalmology | Admitting: Ophthalmology

## 2018-01-11 DIAGNOSIS — Z90710 Acquired absence of both cervix and uterus: Secondary | ICD-10-CM | POA: Insufficient documentation

## 2018-01-11 DIAGNOSIS — M7989 Other specified soft tissue disorders: Secondary | ICD-10-CM | POA: Insufficient documentation

## 2018-01-11 DIAGNOSIS — E1136 Type 2 diabetes mellitus with diabetic cataract: Secondary | ICD-10-CM | POA: Diagnosis not present

## 2018-01-11 DIAGNOSIS — E119 Type 2 diabetes mellitus without complications: Secondary | ICD-10-CM | POA: Diagnosis not present

## 2018-01-11 DIAGNOSIS — K219 Gastro-esophageal reflux disease without esophagitis: Secondary | ICD-10-CM | POA: Insufficient documentation

## 2018-01-11 DIAGNOSIS — Z888 Allergy status to other drugs, medicaments and biological substances status: Secondary | ICD-10-CM | POA: Insufficient documentation

## 2018-01-11 DIAGNOSIS — E785 Hyperlipidemia, unspecified: Secondary | ICD-10-CM | POA: Diagnosis not present

## 2018-01-11 DIAGNOSIS — H25041 Posterior subcapsular polar age-related cataract, right eye: Secondary | ICD-10-CM | POA: Diagnosis not present

## 2018-01-11 DIAGNOSIS — G43909 Migraine, unspecified, not intractable, without status migrainosus: Secondary | ICD-10-CM | POA: Insufficient documentation

## 2018-01-11 DIAGNOSIS — H2511 Age-related nuclear cataract, right eye: Secondary | ICD-10-CM | POA: Diagnosis not present

## 2018-01-11 DIAGNOSIS — E7800 Pure hypercholesterolemia, unspecified: Secondary | ICD-10-CM | POA: Diagnosis not present

## 2018-01-11 DIAGNOSIS — I10 Essential (primary) hypertension: Secondary | ICD-10-CM | POA: Diagnosis not present

## 2018-01-11 DIAGNOSIS — E1140 Type 2 diabetes mellitus with diabetic neuropathy, unspecified: Secondary | ICD-10-CM | POA: Insufficient documentation

## 2018-01-11 DIAGNOSIS — I499 Cardiac arrhythmia, unspecified: Secondary | ICD-10-CM | POA: Insufficient documentation

## 2018-01-11 DIAGNOSIS — F329 Major depressive disorder, single episode, unspecified: Secondary | ICD-10-CM | POA: Diagnosis not present

## 2018-01-11 DIAGNOSIS — R002 Palpitations: Secondary | ICD-10-CM | POA: Diagnosis not present

## 2018-01-11 DIAGNOSIS — R062 Wheezing: Secondary | ICD-10-CM | POA: Diagnosis not present

## 2018-01-11 HISTORY — DX: Headache: R51

## 2018-01-11 HISTORY — DX: Edema, unspecified: R60.9

## 2018-01-11 HISTORY — PX: CATARACT EXTRACTION W/PHACO: SHX586

## 2018-01-11 HISTORY — DX: Major depressive disorder, single episode, unspecified: F32.9

## 2018-01-11 HISTORY — DX: Cardiac arrhythmia, unspecified: I49.9

## 2018-01-11 HISTORY — DX: Depression, unspecified: F32.A

## 2018-01-11 HISTORY — DX: Palpitations: R00.2

## 2018-01-11 HISTORY — DX: Essential (primary) hypertension: I10

## 2018-01-11 HISTORY — DX: Wheezing: R06.2

## 2018-01-11 HISTORY — DX: Headache, unspecified: R51.9

## 2018-01-11 HISTORY — DX: Myoneural disorder, unspecified: G70.9

## 2018-01-11 HISTORY — DX: Type 2 diabetes mellitus without complications: E11.9

## 2018-01-11 LAB — GLUCOSE, CAPILLARY
GLUCOSE-CAPILLARY: 279 mg/dL — AB (ref 70–99)
Glucose-Capillary: 334 mg/dL — ABNORMAL HIGH (ref 70–99)

## 2018-01-11 SURGERY — PHACOEMULSIFICATION, CATARACT, WITH IOL INSERTION
Anesthesia: Monitor Anesthesia Care | Site: Eye | Laterality: Right | Wound class: Clean

## 2018-01-11 MED ORDER — MOXIFLOXACIN HCL 0.5 % OP SOLN: 1.0000 [drp] | OPHTHALMIC | Status: DC | PRN

## 2018-01-11 MED ORDER — MOXIFLOXACIN HCL 0.5 % OP SOLN
OPHTHALMIC | Status: DC | PRN
  Administered 2018-01-11: 0.2 mL via OPHTHALMIC

## 2018-01-11 MED ORDER — CARBACHOL 0.01 % IO SOLN
INTRAOCULAR | Status: DC | PRN
  Administered 2018-01-11: 0.5 mL via INTRAOCULAR

## 2018-01-11 MED ORDER — EPINEPHRINE PF 1 MG/ML IJ SOLN
INTRAMUSCULAR | Status: AC
  Filled 2018-01-11: qty 2

## 2018-01-11 MED ORDER — MIDAZOLAM HCL 2 MG/2ML IJ SOLN
INTRAMUSCULAR | Status: DC | PRN
  Administered 2018-01-11: 2 mg via INTRAVENOUS

## 2018-01-11 MED ORDER — ARMC OPHTHALMIC DILATING DROPS
1.0000 "application " | OPHTHALMIC | Status: AC
  Administered 2018-01-11 (×3): 1 via OPHTHALMIC

## 2018-01-11 MED ORDER — ARMC OPHTHALMIC DILATING DROPS
OPHTHALMIC | Status: AC
  Administered 2018-01-11: 1 via OPHTHALMIC
  Filled 2018-01-11: qty 0.5

## 2018-01-11 MED ORDER — MIDAZOLAM HCL 2 MG/2ML IJ SOLN
INTRAMUSCULAR | Status: AC
  Filled 2018-01-11: qty 2

## 2018-01-11 MED ORDER — EPINEPHRINE PF 1 MG/ML IJ SOLN
INTRAOCULAR | Status: DC | PRN
  Administered 2018-01-11: 08:00:00 via OPHTHALMIC

## 2018-01-11 MED ORDER — SODIUM CHLORIDE 0.9 % IV SOLN
INTRAVENOUS | Status: DC
  Administered 2018-01-11 (×2): via INTRAVENOUS

## 2018-01-11 MED ORDER — MOXIFLOXACIN HCL 0.5 % OP SOLN
OPHTHALMIC | Status: AC
  Filled 2018-01-11: qty 3

## 2018-01-11 MED ORDER — LIDOCAINE HCL (PF) 4 % IJ SOLN
INTRAMUSCULAR | Status: AC
Start: 2018-01-11 — End: ?
  Filled 2018-01-11: qty 5

## 2018-01-11 MED ORDER — NA CHONDROIT SULF-NA HYALURON 40-17 MG/ML IO SOLN
INTRAOCULAR | Status: DC | PRN
  Administered 2018-01-11: 1 mL via INTRAOCULAR

## 2018-01-11 MED ORDER — POVIDONE-IODINE 5 % OP SOLN
OPHTHALMIC | Status: DC | PRN
  Administered 2018-01-11: 1 via OPHTHALMIC

## 2018-01-11 MED ORDER — POVIDONE-IODINE 5 % OP SOLN
OPHTHALMIC | Status: AC
Start: 2018-01-11 — End: ?
  Filled 2018-01-11: qty 30

## 2018-01-11 MED ORDER — LIDOCAINE HCL (PF) 4 % IJ SOLN
INTRAOCULAR | Status: DC | PRN
  Administered 2018-01-11: 4 mL via OPHTHALMIC

## 2018-01-11 MED ORDER — INSULIN ASPART 100 UNIT/ML ~~LOC~~ SOLN
7.0000 [IU] | Freq: Once | SUBCUTANEOUS | Status: AC
  Administered 2018-01-11: 7 [IU] via SUBCUTANEOUS

## 2018-01-11 MED ORDER — INSULIN ASPART 100 UNIT/ML ~~LOC~~ SOLN
SUBCUTANEOUS | Status: AC
  Filled 2018-01-11: qty 1

## 2018-01-11 MED ORDER — NA CHONDROIT SULF-NA HYALURON 40-17 MG/ML IO SOLN
INTRAOCULAR | Status: AC
Start: 2018-01-11 — End: ?
  Filled 2018-01-11: qty 1

## 2018-01-11 SURGICAL SUPPLY — 16 items
GLOVE BIO SURGEON STRL SZ8 (GLOVE) ×2 IMPLANT
GLOVE BIOGEL M 6.5 STRL (GLOVE) ×2 IMPLANT
GLOVE SURG LX 8.0 MICRO (GLOVE) ×1
GLOVE SURG LX STRL 8.0 MICRO (GLOVE) ×1 IMPLANT
GOWN STRL REUS W/ TWL LRG LVL3 (GOWN DISPOSABLE) ×2 IMPLANT
GOWN STRL REUS W/TWL LRG LVL3 (GOWN DISPOSABLE) ×4
LABEL CATARACT MEDS ST (LABEL) ×2 IMPLANT
LENS IOL TECNIS ITEC 23.0 (Intraocular Lens) ×1 IMPLANT
PACK CATARACT (MISCELLANEOUS) ×2 IMPLANT
PACK CATARACT BRASINGTON LX (MISCELLANEOUS) ×2 IMPLANT
PACK EYE AFTER SURG (MISCELLANEOUS) ×2 IMPLANT
SOL BSS BAG (MISCELLANEOUS) ×2
SOLUTION BSS BAG (MISCELLANEOUS) ×1 IMPLANT
SYR 5ML LL (SYRINGE) ×2 IMPLANT
WATER STERILE IRR 250ML POUR (IV SOLUTION) ×2 IMPLANT
WIPE NON LINTING 3.25X3.25 (MISCELLANEOUS) ×2 IMPLANT

## 2018-01-11 NOTE — Discharge Instructions (Signed)
Eye Surgery Discharge Instructions    Expect mild scratchy sensation or mild soreness. DO NOT RUB YOUR EYE!  The day of surgery:  Minimal physical activity, but bed rest is not required  No reading, computer work, or close hand work  No bending, lifting, or straining.  May watch TV  For 24 hours:  No driving, legal decisions, or alcoholic beverages  Safety precautions  Eat anything you prefer: It is better to start with liquids, then soup then solid foods.  _____ Eye patch should be worn until postoperative exam tomorrow.  ____ Solar shield eyeglasses should be worn for comfort in the sunlight/patch while sleeping  Resume all regular medications including aspirin or Coumadin if these were discontinued prior to surgery. You may shower, bathe, shave, or wash your hair. Tylenol may be taken for mild discomfort.  Call your doctor if you experience significant pain, nausea, or vomiting, fever > 101 or other signs of infection. [phone] or [phone] Specific instructions:  Follow-up Information    [name], MD Follow up.   Specialty:  Ophthalmology Why:  August 21 at 8:30am Contact information: [address] [place] [address] [phone]

## 2018-01-11 NOTE — H&P (Signed)
All labs reviewed. Abnormal studies sent to patients PCP when indicated.  Previous H&P reviewed, patient examined, there are NO CHANGES.  Kim Woodlief Porfilio8/20/20197:42 AM

## 2018-01-11 NOTE — Transfer of Care (Addendum)
Immediate Anesthesia Transfer of Care Note  Patient: Kim Hicks  Procedure(s) Performed: CATARACT EXTRACTION PHACO AND INTRAOCULAR LENS PLACEMENT (IOC) (Right Eye)  Patient Location: PACU  Anesthesia Type:MAC  Level of Consciousness: awake  Airway & Oxygen Therapy: Patient Spontanous Breathing  Post-op Assessment: Report given to RN  Post vital signs: stable  Last Vitals:  Vitals Value Taken Time  BP    Temp    Pulse    Resp    SpO2      Last Pain:  Vitals:   01/11/18 0635  TempSrc: Temporal  PainSc: 8          Complications: No apparent anesthesia complications

## 2018-01-11 NOTE — Anesthesia Preprocedure Evaluation (Addendum)
Anesthesia Evaluation  Patient identified by MRN, date of birth, ID band Patient awake    Reviewed: Allergy & Precautions, H&P , NPO status , Patient's Chart, lab work & pertinent test results  Airway Mallampati: II  TM Distance: >3 FB Neck ROM: full    Dental  (+) Teeth Intact   Pulmonary neg pulmonary ROS, neg COPD,    breath sounds clear to auscultation       Cardiovascular hypertension, negative cardio ROS  (-) dysrhythmias  Rhythm:regular Rate:Normal     Neuro/Psych  Headaches, negative neurological ROS  negative psych ROS   GI/Hepatic negative GI ROS, Neg liver ROS, GERD  ,  Endo/Other  negative endocrine ROSdiabetes  Renal/GU negative Renal ROS  negative genitourinary   Musculoskeletal   Abdominal   Peds  Hematology negative hematology ROS (+)   Anesthesia Other Findings Past Medical History: No date: Depression No date: Diabetes mellitus without complication (HCC) No date: Dysrhythmia No date: Edema No date: Headache     Comment:  MIGRAINES No date: Hypertension No date: Neuromuscular disorder (HCC)     Comment:  NEUROPATHY No date: Palpitations No date: Wheezing  Past Surgical History: No date: ABDOMINAL HYSTERECTOMY No date: COLONOSCOPY No date: RCR; Bilateral  BMI    Body Mass Index:  33.28 kg/m      Reproductive/Obstetrics negative OB ROS                            Anesthesia Physical Anesthesia Plan  ASA: III  Anesthesia Plan: MAC   Post-op Pain Management:    Induction:   PONV Risk Score and Plan:   Airway Management Planned:   Additional Equipment:   Intra-op Plan:   Post-operative Plan:   Informed Consent: I have reviewed the patients History and Physical, chart, labs and discussed the procedure including the risks, benefits and alternatives for the proposed anesthesia with the patient or authorized representative who has indicated his/her  understanding and acceptance.   Dental Advisory Given  Plan Discussed with: Anesthesiologist, CRNA and Surgeon  Anesthesia Plan Comments:        Anesthesia Quick Evaluation

## 2018-01-11 NOTE — Anesthesia Postprocedure Evaluation (Signed)
Anesthesia Post Note  Patient: Kim Hicks  Procedure(s) Performed: CATARACT EXTRACTION PHACO AND INTRAOCULAR LENS PLACEMENT (IOC) (Right Eye)  Patient location during evaluation: PACU Anesthesia Type: MAC Level of consciousness: awake and alert Pain management: pain level controlled Vital Signs Assessment: post-procedure vital signs reviewed and stable Respiratory status: spontaneous breathing, nonlabored ventilation and respiratory function stable Cardiovascular status: blood pressure returned to baseline and stable Postop Assessment: no apparent nausea or vomiting Anesthetic complications: no Comments: Pt's BG elevated to 230.  7 units SQ insulin given prior to discharge.  Pt advised to monitor BG closely today and call her PCP if BG remains elevated despite usual coverage at home.     Last Vitals:  Vitals:   01/11/18 0635 01/11/18 0816  BP: 115/65 (!) 110/47  Pulse: 95 60  Resp: 17 16  Temp: (!) 35.9 C 36.9 C  SpO2: 97% 97%    Last Pain:  Vitals:   01/11/18 0816  TempSrc: Oral  PainSc: 0-No pain                 Durenda Hurt

## 2018-01-11 NOTE — Op Note (Signed)
PREOPERATIVE DIAGNOSIS:  Nuclear sclerotic cataract of the right eye.   POSTOPERATIVE DIAGNOSIS:  NUCLEAR SCLEROTIC CATARACT RIGHT EYE   OPERATIVE PROCEDURE: Procedure(s): CATARACT EXTRACTION PHACO AND INTRAOCULAR LENS PLACEMENT (IOC)   SURGEON:  [name], MD.   ANESTHESIA:  Anesthesiologist: Durenda Hurt, MD CRNA: Marcy Siren, CRNA  1.      Managed anesthesia care. 2.      0.72m of Shugarcaine was instilled in the eye following the paracentesis.   COMPLICATIONS:  None.   TECHNIQUE:   Stop and chop   DESCRIPTION OF PROCEDURE:  The patient was examined and consented in the preoperative holding area where the aforementioned topical anesthesia was applied to the right eye and then brought back to the Operating Room where the right eye was prepped and draped in the usual sterile ophthalmic fashion and a lid speculum was placed. A paracentesis was created with the side port blade and the anterior chamber was filled with viscoelastic. A near clear corneal incision was performed with the steel keratome. A continuous curvilinear capsulorrhexis was performed with a cystotome followed by the capsulorrhexis forceps. Hydrodissection and hydrodelineation were carried out with BSS on a blunt cannula. The lens was removed in a stop and chop  technique and the remaining cortical material was removed with the irrigation-aspiration handpiece. The capsular bag was inflated with viscoelastic and the Technis ZCB00  lens was placed in the capsular bag without complication. The remaining viscoelastic was removed from the eye with the irrigation-aspiration handpiece. The wounds were hydrated. The anterior chamber was flushed with Miostat and the eye was inflated to physiologic pressure. 0.158mof Vigamox was placed in the anterior chamber. The wounds were found to be water tight. The eye was dressed with Vigamox. The patient was given protective glasses to wear throughout the day and a shield with  which to sleep tonight. The patient was also given drops with which to begin a drop regimen today and will follow-up with me in one day. Implant Name Type Inv. Item Serial No. Manufacturer Lot No. LRB No. Used  LENS IOL DIOP 23.0 - [id number] Intraocular Lens LENS IOL DIOP 23.0 [id number] AMO  Right 1   Procedure(s) with comments: CATARACT EXTRACTION PHACO AND INTRAOCULAR LENS PLACEMENT (IOC) (Right) - [place]0:57.5 AP% 15.0 CDE 8.61 Fluid Pack Lot # [id number]  Electronically signed: WiBirder Robson/20/2019 8:13 AM

## 2018-01-11 NOTE — Progress Notes (Signed)
Anesthesia aware of blood sugar. No new orders at this time.

## 2018-01-11 NOTE — Anesthesia Post-op Follow-up Note (Signed)
Anesthesia QCDR form completed.        

## 2018-02-07 DIAGNOSIS — H25042 Posterior subcapsular polar age-related cataract, left eye: Secondary | ICD-10-CM | POA: Diagnosis not present

## 2018-02-15 ENCOUNTER — Encounter: Payer: Self-pay | Admitting: Certified Registered Nurse Anesthetist

## 2018-02-15 ENCOUNTER — Ambulatory Visit
Admission: RE | Admit: 2018-02-15 | Discharge: 2018-02-15 | Disposition: A | Discharge status: Home / Self Care | Payer: Medicare Other | Source: Ambulatory Visit | Attending: Ophthalmology | Admitting: Ophthalmology

## 2018-02-15 ENCOUNTER — Encounter
Admission: RE | Disposition: A | Discharge status: Home / Self Care | Payer: Self-pay | Source: Ambulatory Visit | Attending: Ophthalmology

## 2018-02-15 ENCOUNTER — Other Ambulatory Visit: Payer: Self-pay

## 2018-02-15 ENCOUNTER — Ambulatory Visit: Payer: Medicare Other | Admitting: Certified Registered Nurse Anesthetist

## 2018-02-15 DIAGNOSIS — Z888 Allergy status to other drugs, medicaments and biological substances status: Secondary | ICD-10-CM | POA: Insufficient documentation

## 2018-02-15 DIAGNOSIS — E1140 Type 2 diabetes mellitus with diabetic neuropathy, unspecified: Secondary | ICD-10-CM | POA: Diagnosis not present

## 2018-02-15 DIAGNOSIS — Z885 Allergy status to narcotic agent status: Secondary | ICD-10-CM | POA: Diagnosis not present

## 2018-02-15 DIAGNOSIS — Z79899 Other long term (current) drug therapy: Secondary | ICD-10-CM | POA: Diagnosis not present

## 2018-02-15 DIAGNOSIS — F329 Major depressive disorder, single episode, unspecified: Secondary | ICD-10-CM | POA: Insufficient documentation

## 2018-02-15 DIAGNOSIS — Z7984 Long term (current) use of oral hypoglycemic drugs: Secondary | ICD-10-CM | POA: Diagnosis not present

## 2018-02-15 DIAGNOSIS — H2512 Age-related nuclear cataract, left eye: Secondary | ICD-10-CM | POA: Diagnosis not present

## 2018-02-15 DIAGNOSIS — E7800 Pure hypercholesterolemia, unspecified: Secondary | ICD-10-CM | POA: Insufficient documentation

## 2018-02-15 DIAGNOSIS — I10 Essential (primary) hypertension: Secondary | ICD-10-CM | POA: Insufficient documentation

## 2018-02-15 DIAGNOSIS — H25042 Posterior subcapsular polar age-related cataract, left eye: Secondary | ICD-10-CM | POA: Diagnosis not present

## 2018-02-15 DIAGNOSIS — E119 Type 2 diabetes mellitus without complications: Secondary | ICD-10-CM | POA: Diagnosis not present

## 2018-02-15 DIAGNOSIS — K219 Gastro-esophageal reflux disease without esophagitis: Secondary | ICD-10-CM | POA: Diagnosis not present

## 2018-02-15 DIAGNOSIS — E1136 Type 2 diabetes mellitus with diabetic cataract: Secondary | ICD-10-CM | POA: Insufficient documentation

## 2018-02-15 HISTORY — PX: CATARACT EXTRACTION W/PHACO: SHX586

## 2018-02-15 LAB — GLUCOSE, CAPILLARY
GLUCOSE-CAPILLARY: 313 mg/dL — AB (ref 70–99)
GLUCOSE-CAPILLARY: 326 mg/dL — AB (ref 70–99)
Glucose-Capillary: 359 mg/dL — ABNORMAL HIGH (ref 70–99)

## 2018-02-15 SURGERY — PHACOEMULSIFICATION, CATARACT, WITH IOL INSERTION
Anesthesia: Monitor Anesthesia Care | Site: Eye | Laterality: Left

## 2018-02-15 MED ORDER — MIDAZOLAM HCL 2 MG/2ML IJ SOLN
INTRAMUSCULAR | Status: AC
  Filled 2018-02-15: qty 2

## 2018-02-15 MED ORDER — INSULIN ASPART 100 UNIT/ML ~~LOC~~ SOLN
SUBCUTANEOUS | Status: AC
  Administered 2018-02-15: 4 [IU] via SUBCUTANEOUS
  Filled 2018-02-15: qty 1

## 2018-02-15 MED ORDER — LIDOCAINE HCL (PF) 4 % IJ SOLN
INTRAOCULAR | Status: DC | PRN
  Administered 2018-02-15: 2 mL via OPHTHALMIC

## 2018-02-15 MED ORDER — TETRACAINE HCL 0.5 % OP SOLN
1.0000 [drp] | OPHTHALMIC | Status: DC | PRN
  Administered 2018-02-15 (×2): 1 [drp] via OPHTHALMIC

## 2018-02-15 MED ORDER — ARMC OPHTHALMIC DILATING DROPS
1.0000 "application " | OPHTHALMIC | Status: AC
  Administered 2018-02-15 (×2): 1 via OPHTHALMIC

## 2018-02-15 MED ORDER — EPINEPHRINE PF 1 MG/ML IJ SOLN
INTRAOCULAR | Status: DC | PRN
  Administered 2018-02-15: 1 mL via OPHTHALMIC

## 2018-02-15 MED ORDER — SODIUM CHLORIDE 0.9 % IV SOLN
INTRAVENOUS | Status: DC
  Administered 2018-02-15: 11:00:00 via INTRAVENOUS

## 2018-02-15 MED ORDER — MOXIFLOXACIN HCL 0.5 % OP SOLN: 1.0000 [drp] | OPHTHALMIC | Status: DC | PRN

## 2018-02-15 MED ORDER — LIDOCAINE HCL (PF) 4 % IJ SOLN
INTRAMUSCULAR | Status: AC
  Filled 2018-02-15: qty 5

## 2018-02-15 MED ORDER — MIDAZOLAM HCL 2 MG/2ML IJ SOLN
INTRAMUSCULAR | Status: DC | PRN
  Administered 2018-02-15 (×2): 1 mg via INTRAVENOUS

## 2018-02-15 MED ORDER — NA CHONDROIT SULF-NA HYALURON 40-17 MG/ML IO SOLN
INTRAOCULAR | Status: DC | PRN
  Administered 2018-02-15: 1 mL via INTRAOCULAR

## 2018-02-15 MED ORDER — FENTANYL CITRATE (PF) 100 MCG/2ML IJ SOLN: 25.0000 ug | INTRAMUSCULAR | Status: DC | PRN

## 2018-02-15 MED ORDER — ONDANSETRON HCL 4 MG/2ML IJ SOLN: 4.0000 mg | Freq: Once | INTRAMUSCULAR | Status: DC | PRN

## 2018-02-15 MED ORDER — CARBACHOL 0.01 % IO SOLN
INTRAOCULAR | Status: DC | PRN
  Administered 2018-02-15: .5 mL via INTRAOCULAR

## 2018-02-15 MED ORDER — EPINEPHRINE PF 1 MG/ML IJ SOLN
INTRAMUSCULAR | Status: AC
  Filled 2018-02-15: qty 1

## 2018-02-15 MED ORDER — INSULIN ASPART 100 UNIT/ML ~~LOC~~ SOLN
SUBCUTANEOUS | Status: AC
  Filled 2018-02-15: qty 1

## 2018-02-15 MED ORDER — MOXIFLOXACIN HCL 0.5 % OP SOLN
OPHTHALMIC | Status: DC | PRN
  Administered 2018-02-15: .2 mL via OPHTHALMIC

## 2018-02-15 MED ORDER — INSULIN ASPART 100 UNIT/ML ~~LOC~~ SOLN
8.0000 [IU] | Freq: Once | SUBCUTANEOUS | Status: AC
  Administered 2018-02-15: 8 [IU] via SUBCUTANEOUS

## 2018-02-15 MED ORDER — POVIDONE-IODINE 5 % OP SOLN
OPHTHALMIC | Status: DC | PRN
  Administered 2018-02-15: 1 via OPHTHALMIC

## 2018-02-15 MED ORDER — INSULIN ASPART 100 UNIT/ML ~~LOC~~ SOLN
4.0000 [IU] | Freq: Once | SUBCUTANEOUS | Status: AC
  Administered 2018-02-15: 4 [IU] via SUBCUTANEOUS

## 2018-02-15 MED ORDER — POVIDONE-IODINE 5 % OP SOLN
OPHTHALMIC | Status: AC
  Filled 2018-02-15: qty 30

## 2018-02-15 MED ORDER — NA CHONDROIT SULF-NA HYALURON 40-17 MG/ML IO SOLN
INTRAOCULAR | Status: AC
  Filled 2018-02-15: qty 1

## 2018-02-15 SURGICAL SUPPLY — 16 items
GLOVE BIO SURGEON STRL SZ8 (GLOVE) ×2 IMPLANT
GLOVE BIOGEL M 6.5 STRL (GLOVE) ×2 IMPLANT
GLOVE SURG LX 8.0 MICRO (GLOVE) ×1
GLOVE SURG LX STRL 8.0 MICRO (GLOVE) ×1 IMPLANT
GOWN STRL REUS W/ TWL LRG LVL3 (GOWN DISPOSABLE) ×2 IMPLANT
GOWN STRL REUS W/TWL LRG LVL3 (GOWN DISPOSABLE) ×4
LABEL CATARACT MEDS ST (LABEL) ×2 IMPLANT
LENS IOL TECNIS ITEC 22.5 (Intraocular Lens) ×1 IMPLANT
PACK CATARACT (MISCELLANEOUS) ×2 IMPLANT
PACK CATARACT BRASINGTON LX (MISCELLANEOUS) ×2 IMPLANT
PACK EYE AFTER SURG (MISCELLANEOUS) ×2 IMPLANT
SOL BSS BAG (MISCELLANEOUS) ×2
SOLUTION BSS BAG (MISCELLANEOUS) ×1 IMPLANT
SYR 5ML LL (SYRINGE) ×2 IMPLANT
WATER STERILE IRR 250ML POUR (IV SOLUTION) ×2 IMPLANT
WIPE NON LINTING 3.25X3.25 (MISCELLANEOUS) ×2 IMPLANT

## 2018-02-15 NOTE — Anesthesia Postprocedure Evaluation (Signed)
Anesthesia Post Note  Patient: Kim Hicks  Procedure(s) Performed: CATARACT EXTRACTION PHACO AND INTRAOCULAR LENS PLACEMENT (IOC) (Left Eye)  Patient location during evaluation: Short Stay Anesthesia Type: MAC Level of consciousness: awake and alert, oriented and patient cooperative Pain management: satisfactory to patient Vital Signs Assessment: post-procedure vital signs reviewed and stable Respiratory status: spontaneous breathing, respiratory function stable and nonlabored ventilation Cardiovascular status: blood pressure returned to baseline and stable Postop Assessment: no headache and no apparent nausea or vomiting Anesthetic complications: no     Last Vitals:  Vitals:   02/15/18 1010 02/15/18 1105  BP: (!) 112/58 (!) 105/48  Pulse: 91 94  Resp: 18 16  Temp: (!) 36.1 C (!) 36.2 C  SpO2: 98% 97%    Last Pain:  Vitals:   02/15/18 1105  TempSrc: Temporal  PainSc: 0-No pain                 Eben Burow

## 2018-02-15 NOTE — Anesthesia Preprocedure Evaluation (Addendum)
Anesthesia Evaluation  Patient identified by MRN, date of birth, ID band Patient awake    Reviewed: Allergy & Precautions, H&P , NPO status , Patient's Chart, lab work & pertinent test results  Airway Mallampati: II  TM Distance: >3 FB Neck ROM: full    Dental  (+) Teeth Intact   Pulmonary neg pulmonary ROS, neg COPD,    breath sounds clear to auscultation       Cardiovascular hypertension, negative cardio ROS  (-) dysrhythmias  Rhythm:regular Rate:Normal     Neuro/Psych  Headaches, negative neurological ROS  negative psych ROS   GI/Hepatic negative GI ROS, Neg liver ROS, GERD  ,  Endo/Other  negative endocrine ROSdiabetes  Renal/GU negative Renal ROS  negative genitourinary   Musculoskeletal   Abdominal   Peds  Hematology negative hematology ROS (+)   Anesthesia Other Findings Past Medical History: No date: Depression No date: Diabetes mellitus without complication (HCC) No date: Dysrhythmia No date: Edema No date: Headache     Comment:  MIGRAINES No date: Hypertension No date: Neuromuscular disorder (HCC)     Comment:  NEUROPATHY No date: Palpitations No date: Wheezing  Past Surgical History: No date: ABDOMINAL HYSTERECTOMY No date: COLONOSCOPY No date: RCR; Bilateral  BMI    Body Mass Index:  33.28 kg/m      Reproductive/Obstetrics negative OB ROS                             Anesthesia Physical  Anesthesia Plan  ASA: III  Anesthesia Plan: MAC   Post-op Pain Management:    Induction: Intravenous  PONV Risk Score and Plan:   Airway Management Planned: Nasal Cannula  Additional Equipment:   Intra-op Plan:   Post-operative Plan:   Informed Consent: I have reviewed the patients History and Physical, chart, labs and discussed the procedure including the risks, benefits and alternatives for the proposed anesthesia with the patient or authorized representative  who has indicated his/her understanding and acceptance.   Dental Advisory Given  Plan Discussed with: Anesthesiologist, CRNA and Surgeon  Anesthesia Plan Comments:         Anesthesia Quick Evaluation

## 2018-02-15 NOTE — Transfer of Care (Signed)
Immediate Anesthesia Transfer of Care Note  Patient: Kim Hicks  Procedure(s) Performed: CATARACT EXTRACTION PHACO AND INTRAOCULAR LENS PLACEMENT (IOC) (Left Eye)  Patient Location: Short Stay  Anesthesia Type:MAC  Level of Consciousness: awake, alert , oriented and patient cooperative  Airway & Oxygen Therapy: Patient Spontanous Breathing  Post-op Assessment: Report given to RN and Post -op Vital signs reviewed and stable  Post vital signs: Reviewed and stable  Last Vitals:  Vitals Value Taken Time  BP    Temp    Pulse    Resp    SpO2      Last Pain:  Vitals:   02/15/18 1010  TempSrc: Temporal  PainSc: 0-No pain         Complications: No apparent anesthesia complications

## 2018-02-15 NOTE — Discharge Instructions (Signed)
Eye Surgery Discharge Instructions    Expect mild scratchy sensation or mild soreness. DO NOT RUB YOUR EYE!  The day of surgery:  Minimal physical activity, but bed rest is not required  No reading, computer work, or close hand work  No bending, lifting, or straining.  May watch TV  For 24 hours:  No driving, legal decisions, or alcoholic beverages  Safety precautions  Eat anything you prefer: It is better to start with liquids, then soup then solid foods.  _____ Eye patch should be worn until postoperative exam tomorrow.  ____ Solar shield eyeglasses should be worn for comfort in the sunlight/patch while sleeping  Resume all regular medications including aspirin or Coumadin if these were discontinued prior to surgery. You may shower, bathe, shave, or wash your hair. Tylenol may be taken for mild discomfort.  Call your doctor if you experience significant pain, nausea, or vomiting, fever > 101 or other signs of infection. [phone] or [phone] Specific instructions:  Follow-up Information    [name], MD Follow up on 02/16/2018.   Specialty:  Ophthalmology Why:  appointment time 10:10 AM Contact information: [address] [place] [address] [phone]

## 2018-02-15 NOTE — H&P (Signed)
All labs reviewed. Abnormal studies sent to patients PCP when indicated.  Previous H&P reviewed, patient examined, there are NO CHANGES.  Kim Brailsford Porfilio9/24/201910:34 AM

## 2018-02-15 NOTE — Anesthesia Post-op Follow-up Note (Signed)
Anesthesia QCDR form completed.        

## 2018-02-15 NOTE — Op Note (Signed)
PREOPERATIVE DIAGNOSIS:  Nuclear sclerotic cataract of the left eye.   POSTOPERATIVE DIAGNOSIS:  Nuclear sclerotic cataract of the left eye.   OPERATIVE PROCEDURE: Procedure(s): CATARACT EXTRACTION PHACO AND INTRAOCULAR LENS PLACEMENT (IOC)   SURGEON:  [name], MD.   ANESTHESIA:  Anesthesiologist: Alvin Critchley, MD CRNA: Eben Burow, CRNA  1.      Managed anesthesia care. 2.     0.64m of Shugarcaine was instilled following the paracentesis   COMPLICATIONS:  None.   TECHNIQUE:   Stop and chop   DESCRIPTION OF PROCEDURE:  The patient was examined and consented in the preoperative holding area where the aforementioned topical anesthesia was applied to the left eye and then brought back to the Operating Room where the left eye was prepped and draped in the usual sterile ophthalmic fashion and a lid speculum was placed. A paracentesis was created with the side port blade and the anterior chamber was filled with viscoelastic. A near clear corneal incision was performed with the steel keratome. A continuous curvilinear capsulorrhexis was performed with a cystotome followed by the capsulorrhexis forceps. Hydrodissection and hydrodelineation were carried out with BSS on a blunt cannula. The lens was removed in a stop and chop  technique and the remaining cortical material was removed with the irrigation-aspiration handpiece. The capsular bag was inflated with viscoelastic and the Technis ZCB00 lens was placed in the capsular bag without complication. The remaining viscoelastic was removed from the eye with the irrigation-aspiration handpiece. The wounds were hydrated. The anterior chamber was flushed with Miostat and the eye was inflated to physiologic pressure. 0.136mVigamox was placed in the anterior chamber. The wounds were found to be water tight. The eye was dressed with Vigamox. The patient was given protective glasses to wear throughout the day and a shield with which to sleep tonight.  The patient was also given drops with which to begin a drop regimen today and will follow-up with me in one day. Implant Name Type Inv. Item Serial No. Manufacturer Lot No. LRB No. Used  LENS IOL DIOP 22.5 - [id number] Intraocular Lens LENS IOL DIOP 22.5 [id number] AMO  Left 1    Procedure(s) with comments: CATARACT EXTRACTION PHACO AND INTRAOCULAR LENS PLACEMENT (IOC) (Left) - [place]:51 AP% 13.2 CDE 6.82 Fluid p ack lot # [id number]  Electronically signed: WiBirder Robson/24/2019 11:03 AM

## 2018-02-16 ENCOUNTER — Encounter: Payer: Self-pay | Admitting: Ophthalmology

## 2018-03-07 DIAGNOSIS — F332 Major depressive disorder, recurrent severe without psychotic features: Secondary | ICD-10-CM | POA: Diagnosis not present

## 2018-03-15 DIAGNOSIS — G43719 Chronic migraine without aura, intractable, without status migrainosus: Secondary | ICD-10-CM | POA: Diagnosis not present

## 2018-03-15 DIAGNOSIS — M542 Cervicalgia: Secondary | ICD-10-CM | POA: Diagnosis not present

## 2018-03-15 DIAGNOSIS — M791 Myalgia, unspecified site: Secondary | ICD-10-CM | POA: Diagnosis not present

## 2018-03-15 DIAGNOSIS — Z23 Encounter for immunization: Secondary | ICD-10-CM | POA: Diagnosis not present

## 2018-03-15 DIAGNOSIS — R51 Headache: Secondary | ICD-10-CM | POA: Diagnosis not present

## 2018-03-15 DIAGNOSIS — G518 Other disorders of facial nerve: Secondary | ICD-10-CM | POA: Diagnosis not present

## 2018-03-22 DIAGNOSIS — J3489 Other specified disorders of nose and nasal sinuses: Secondary | ICD-10-CM | POA: Diagnosis not present

## 2018-03-22 DIAGNOSIS — R52 Pain, unspecified: Secondary | ICD-10-CM | POA: Diagnosis not present

## 2018-03-24 DIAGNOSIS — Z139 Encounter for screening, unspecified: Secondary | ICD-10-CM | POA: Diagnosis not present

## 2018-03-24 DIAGNOSIS — E7800 Pure hypercholesterolemia, unspecified: Secondary | ICD-10-CM | POA: Diagnosis not present

## 2018-03-24 DIAGNOSIS — I10 Essential (primary) hypertension: Secondary | ICD-10-CM | POA: Diagnosis not present

## 2018-03-24 DIAGNOSIS — F209 Schizophrenia, unspecified: Secondary | ICD-10-CM | POA: Diagnosis not present

## 2018-03-24 DIAGNOSIS — E039 Hypothyroidism, unspecified: Secondary | ICD-10-CM | POA: Diagnosis not present

## 2018-03-24 DIAGNOSIS — J45909 Unspecified asthma, uncomplicated: Secondary | ICD-10-CM | POA: Diagnosis not present

## 2018-03-24 DIAGNOSIS — E119 Type 2 diabetes mellitus without complications: Secondary | ICD-10-CM | POA: Diagnosis not present

## 2018-04-26 DIAGNOSIS — M791 Myalgia, unspecified site: Secondary | ICD-10-CM | POA: Diagnosis not present

## 2018-04-26 DIAGNOSIS — G518 Other disorders of facial nerve: Secondary | ICD-10-CM | POA: Diagnosis not present

## 2018-04-26 DIAGNOSIS — G43719 Chronic migraine without aura, intractable, without status migrainosus: Secondary | ICD-10-CM | POA: Diagnosis not present

## 2018-04-26 DIAGNOSIS — R51 Headache: Secondary | ICD-10-CM | POA: Diagnosis not present

## 2018-04-26 DIAGNOSIS — M542 Cervicalgia: Secondary | ICD-10-CM | POA: Diagnosis not present

## 2018-05-24 DIAGNOSIS — J018 Other acute sinusitis: Secondary | ICD-10-CM | POA: Diagnosis not present

## 2018-05-24 DIAGNOSIS — J029 Acute pharyngitis, unspecified: Secondary | ICD-10-CM | POA: Diagnosis not present

## 2019-02-14 ENCOUNTER — Other Ambulatory Visit: Payer: Self-pay

## 2019-02-14 ENCOUNTER — Ambulatory Visit (INDEPENDENT_AMBULATORY_CARE_PROVIDER_SITE_OTHER): Payer: Medicare Other | Admitting: Cardiovascular Disease

## 2019-02-14 ENCOUNTER — Encounter: Payer: Self-pay | Admitting: Cardiovascular Disease

## 2019-02-14 VITALS — BP 115/77 | HR 103 | Ht 65.0 in | Wt 174.8 lb

## 2019-02-14 DIAGNOSIS — I959 Hypotension, unspecified: Secondary | ICD-10-CM

## 2019-02-14 MED ORDER — LISINOPRIL 20 MG PO TABS: 20.0000 mg | ORAL_TABLET | Freq: Every day | ORAL | 3 refills | Status: DC

## 2019-02-14 NOTE — Progress Notes (Signed)
02/14/2019 Kim Hicks   [birth year]  [id number]  Primary Physician [name], [place] Primary Cardiologist: [name] [name], [name]  HPI:  Kim Hicks is a 53 y.o. moderately overweight married Caucasian female mother of 1 child, grandmother of 2 grandchildren referred by [name], her headache doctor for evaluation of symptomatic hypotension.  She is currently on disability for severe depression.  She has a history of treated hypertension, diabetes and hyperlipidemia.  Her mother did have a myocardial infarction.  She is never had a heart attack or stroke.  Recently she is noticed symptomatic hypotension with dizziness and 2 episodes of syncope which she describes.   Current Meds  Medication Sig  . acetaminophen (TYLENOL) 500 MG tablet Take 1,500 mg by mouth every 6 (six) hours as needed for moderate pain or headache.  . Brexpiprazole (REXULTI) 2 MG TABS Take 2 mg by mouth at bedtime.  . diclofenac (VOLTAREN) 75 MG EC tablet Take 75 mg by mouth 2 (two) times daily as needed. for pain  . Empagliflozin-metFORMIN HCl ER (SYNJARDY XR) 25-1000 MG TB24 Take by mouth.  . gabapentin (NEURONTIN) 400 MG capsule Take 400 mg by mouth 3 (three) times daily.  [name] lisinopril (PRINIVIL,ZESTRIL) 40 MG tablet Take 40 mg by mouth daily.  . meclizine (ANTIVERT) 25 MG tablet TAKE 1 2 TABLETS BY MOUTH EVERY 6 HOURS AS NEEDED  . ondansetron (ZOFRAN) 4 MG tablet TAKE 1 TABLET BY MOUTH AS NEEDED FOR NAUSEA MAY REPEAT ONCE AFTER 4 HOURS  . phentermine (ADIPEX-P) 37.5 MG tablet Take 37.5 mg by mouth daily.  . pioglitazone (ACTOS) 15 MG tablet Take 15 mg by mouth daily.  . prazosin (MINIPRESS) 2 MG capsule Take 4 mg by mouth at bedtime.  [name] QUEtiapine (SEROQUEL) 400 MG tablet Take 800 mg by mouth at bedtime.  [name] QUEtiapine (SEROQUEL) 50 MG tablet Take 100 mg by mouth daily.  . simvastatin (ZOCOR) 20 MG tablet Take 20 mg by mouth daily.  [name] ER 120 MG 24 hr capsule Take 120  mg by mouth daily.  [name] tiZANidine (ZANAFLEX) 4 MG tablet Take 4 mg by mouth every 6 (six) hours as needed.  . topiramate (TOPAMAX) 100 MG tablet   . traMADol ([name]) 50 MG tablet Take 50 mg by mouth 2 (two) times daily.  . TRULICITY 1.5 JJ/8.8CZ SOPN INJECT0.5 ML(1 SYRINGE) UNDER THE SKIN EVERY WEEK  . vortioxetine HBr ([name]) 20 MG TABS tablet Take 20 mg by mouth at bedtime.  . ziprasidone ([name]) 20 MG capsule Take 20 mg by mouth at bedtime.  [name] zonisamide ([name]) 100 MG capsule Take 200 mg by mouth at bedtime.     Allergies  Allergen Reactions  . Hydrocodone-Acetaminophen Other (See Comments)    Unknown  . Pentazocine-Naloxone Other (See Comments)    Unknown  . Quinapril Hcl Other (See Comments)    Unknown  . Talwin [Pentazocine]     Social History   Socioeconomic History  . Marital status: Married    Spouse name: Not on file  . Number of children: Not on file  . Years of education: Not on file  . Highest education level: Not on file  Occupational History  . Not on file  Social Needs  . Financial resource strain: Not on file  . Food insecurity    Worry: Not on file    Inability: Not on file  . Transportation needs    Medical: Not on file  Non-medical: Not on file  Tobacco Use  . Smoking status: Never Smoker  . Smokeless tobacco: Never Used  Substance and Sexual Activity  . Alcohol use: Not Currently  . Drug use: Not on file  . Sexual activity: Not on file  Lifestyle  . Physical activity    Days per week: Not on file    Minutes per session: Not on file  . Stress: Not on file  Relationships  . Social Herbalist on phone: Not on file    Gets together: Not on file    Attends religious service: Not on file    Active member of club or organization: Not on file    Attends meetings of clubs or organizations: Not on file    Relationship status: Not on file  . Intimate partner violence    Fear of current or ex partner: Not on file     Emotionally abused: Not on file    Physically abused: Not on file    Forced sexual activity: Not on file  Other Topics Concern  . Not on file  Social History Narrative  . Not on file     Review of Systems: General: negative for chills, fever, night sweats or weight changes.  Cardiovascular: negative for chest pain, dyspnea on exertion, edema, orthopnea, palpitations, paroxysmal nocturnal dyspnea or shortness of breath Dermatological: negative for rash Respiratory: negative for cough or wheezing Urologic: negative for hematuria Abdominal: negative for nausea, vomiting, diarrhea, bright red blood per rectum, melena, or hematemesis Neurologic: negative for visual changes, syncope, or dizziness All other systems reviewed and are otherwise negative except as noted above.    Blood pressure 115/77, pulse (!) 103, height 5' 5"  (1.651 m), weight 174 lb 12.8 oz (79.3 kg), SpO2 100 %.  General appearance: alert and no distress Neck: no adenopathy, no carotid bruit, no JVD, supple, symmetrical, trachea midline and thyroid not enlarged, symmetric, no tenderness/mass/nodules Lungs: clear to auscultation bilaterally Heart: regular rate and rhythm, S1, S2 normal, no murmur, click, rub or gallop Extremities: extremities normal, atraumatic, no cyanosis or edema Pulses: 2+ and symmetric Skin: Skin color, texture, turgor normal. No rashes or lesions Neurologic: Alert and oriented X 3, normal strength and tone. Normal symmetric reflexes. Normal coordination and gait  EKG not performed today  ASSESSMENT AND PLAN:   HYPERLIPIDEMIA History of hyperlipidemia on statin therapy followed by her PCP  HYPERTENSION History of essential hypertension on lisinopril and diltiazem with blood pressure measured today at 115/77.  She is been having issues with hypotension which is episodic and from which she is symptomatic.  I am going to decrease her lisinopril from 40 to 20 mg a day and have her keep a 30-day  blood pressure log.  She will come in to see Cyril Mourning, our Pharm.D. in our hypertension clinic to review her findings and her medications.      [name] FACP,FACC,FAHA, [facility] 02/14/2019 4:43 PM

## 2019-02-14 NOTE — Assessment & Plan Note (Signed)
History of hyperlipidemia on statin therapy followed by her PCP. 

## 2019-02-14 NOTE — Patient Instructions (Addendum)
Medication Instructions:  Your physician has recommended you make the following change in your medication:   DECREASE YOUR LISINOPRIL TO 20 MG BY MOUTH DAILY  If you need a refill on your cardiac medications before your next appointment, please call your pharmacy.   Lab work: NONE If you have labs (blood work) drawn today and your tests are completely normal, you will receive your results only by: [name] MyChart Message (if you have MyChart) OR . A paper copy in the mail If you have any lab test that is abnormal or we need to change your treatment, we will call you to review the results.  Testing/Procedures: NONE  Follow-Up: At [facility], you and your health needs are our priority.  As part of our continuing mission to provide you with exceptional heart care, we have created designated Provider Care Teams.  These Care Teams include your primary Cardiologist (physician) and Advanced Practice Providers (APPs -  Physician Assistants and Nurse Practitioners) who all work together to provide you with the care you need, when you need it. . You will need a follow up appointment in 3 months with Dr. Quay Burow.  Please call our office 2 months in advance to schedule this/each appointment.     Any Other Special Instructions Will Be Listed Below (If Applicable). KEEP A BLOOD PRESSURE LOG FOR 30 DAYS THEN FOLLOW UP WITH A CLINICAL PHARMACIST IN THE HYPERTENSION CLINIC TO REVIEW YOUR BLOOD PRESSURE READINGS AND CURRENT MEDICATIONS. YOU WILL NEED AN APPOINTMENT.

## 2019-02-14 NOTE — Assessment & Plan Note (Signed)
History of essential hypertension on lisinopril and diltiazem with blood pressure measured today at 115/77.  She is been having issues with hypotension which is episodic and from which she is symptomatic.  I am going to decrease her lisinopril from 40 to 20 mg a day and have her keep a 30-day blood pressure log.  She will come in to see Cyril Mourning, our Pharm.D. in our hypertension clinic to review her findings and her medications.

## 2019-03-28 ENCOUNTER — Ambulatory Visit (INDEPENDENT_AMBULATORY_CARE_PROVIDER_SITE_OTHER): Payer: Medicare Other | Admitting: Pharmacist

## 2019-03-28 ENCOUNTER — Other Ambulatory Visit: Payer: Self-pay

## 2019-03-28 VITALS — BP 98/64 | HR 90 | Ht 65.0 in | Wt 175.0 lb

## 2019-03-28 DIAGNOSIS — K5900 Constipation, unspecified: Secondary | ICD-10-CM | POA: Diagnosis not present

## 2019-03-28 DIAGNOSIS — I10 Essential (primary) hypertension: Secondary | ICD-10-CM | POA: Diagnosis not present

## 2019-03-28 NOTE — Progress Notes (Signed)
Patient ID: Kim Hicks                 DOB: [birth year]                      MRN: [id number]     HPI:  Kim Hicks is a 53 y.o. female referred by Dr. Gwenlyn Found to HTN clinic. PMH includes hypertension, migraine headaches, GERD, IBS, DM-II, hyperlipidemia, depression. Patient reports weight loss of 53lb in 13 months.  Her lisinopril dose was decreased from 31m to 262mduring last OV d/t orthostatic hypotension.  She presents to clinic today and reports dizziness and low extremity edema. Currently taking furosemide 2019mvery other day to manage her edema. Patient had 2 falls in the last 4 week d/t dizziness.  Patient also reports occasional N/V in the afternoons and constipation with last BM 3 days ago. She had used  metamucil, dulcolax, enema , and epson salt in warm water, and magnesium citrate without resolution.  Current HTN meds:  Furosemide 31m45mily as needed for edema (taking every other day) Prazosin 2mg 63mly - taking for nightmares Lisinopril 31mg 25my Diltiazem 131mg d85m  Intolerance:  Quinapril - unknown reaction  BP goal: 130/80  Family History: MI in mother  Social History: denies alcohol use, tobacco use or illicit drugs  Diet: all meal at home. Low sodium and low carbohydrate  Exercise: some walking and activities of daily living  Home BP readings:  25 readings: average 104/64 (lowest reading 61/42); HR 87-115bpm  Wt Readings from Last 3 Encounters:  03/28/19 175 lb (79.4 kg)  02/14/19 174 lb 12.8 oz (79.3 kg)  02/14/18 200 lb (90.7 kg)   BP Readings from Last 3 Encounters:  03/28/19 98/64  02/14/19 115/77  02/15/18 (!) 105/48   Pulse Readings from Last 3 Encounters:  03/28/19 90  02/14/19 (!) 103  02/15/18 94    Past Medical History:  Diagnosis Date  . Depression   . Diabetes mellitus without complication (HCC)   [place]   . Edema   . Headache    MIGRAINES  . Hypertension   . Neuromuscular disorder (HCC)   [place]  . Palpitations    . Wheezing     Current Outpatient Medications on File Prior to Visit  Medication Sig Dispense Refill  . acetaminophen (TYLENOL) 500 MG tablet Take 1,500 mg by mouth every 6 (six) hours as needed for moderate pain or headache.    . Brexpiprazole (REXULTI) 2 MG TABS Take 2 mg by mouth at bedtime.    . Empagliflozin-metFORMIN HCl ER (SYNJARDY XR) 25-1000 MG TB24 Take by mouth.    . furosemide (LASIX) 20 MG tablet Take 20 mg by mouth daily as needed. Patient taking every other day    . gabapentin (NEURONTIN) 400 MG capsule Take 400 mg by mouth 3 (three) times daily.    . meclizine (ANTIVERT) 25 MG tablet TAKE 1 2 TABLETS BY MOUTH EVERY 6 HOURS AS NEEDED    . meloxicam (MOBIC) 15 MG tablet Take 15 mg by mouth daily.    . Omega 3 1000 MG CAPS Take 1 capsule by mouth 3 (three) times daily.    . ondansetron (ZOFRAN) 4 MG tablet TAKE 1 TABLET BY MOUTH AS NEEDED FOR NAUSEA MAY REPEAT ONCE AFTER 4 HOURS    . phentermine (ADIPEX-P) 37.5 MG tablet Take 37.5 mg by mouth daily.    . potassium chloride (KLOR-CON) 20 MEQ packet Take by mouth  2 (two) times daily.    . prazosin (MINIPRESS) 2 MG capsule Take 4 mg by mouth at bedtime.    [name] QUEtiapine (SEROQUEL) 400 MG tablet Take 800 mg by mouth at bedtime.    [name] QUEtiapine (SEROQUEL) 50 MG tablet Take 100 mg by mouth daily.    . simvastatin (ZOCOR) 20 MG tablet Take 20 mg by mouth daily.  6  . TIADYLT ER 120 MG 24 hr capsule Take 120 mg by mouth daily.    [name] tiZANidine (ZANAFLEX) 4 MG tablet Take 4 mg by mouth every 6 (six) hours as needed.    . topiramate (TOPAMAX) 100 MG tablet     . traMADol ([name]) 50 MG tablet Take 50 mg by mouth 2 (two) times daily.    . TRULICITY 1.5 LO/7.5IE SOPN 3 mg.    . vortioxetine HBr ([name]) 20 MG TABS tablet Take 20 mg by mouth at bedtime.    . ziprasidone ([name]) 20 MG capsule Take 20 mg by mouth at bedtime.    [name] zonisamide ([name]) 100 MG capsule Take 200 mg by mouth at bedtime.     No current facility-administered  medications on file prior to visit.     Allergies  Allergen Reactions  . Hydrocodone-Acetaminophen Other (See Comments)    Unknown  . Pentazocine-Naloxone Other (See Comments)    Unknown  . Quinapril Hcl Other (See Comments)    Unknown  . Talwin [Pentazocine]     Blood pressure 98/64, pulse 90, height 5' 5"  (1.651 m), weight 175 lb (79.4 kg), SpO2 93 %.  Constipation Patient reports last BM 3 days ago. Noted history if IBS, and persistent N/V. She saw her PCP today but forgot to discuss constipation issues. Patient tried  metamucil, dulcolax, enema , and epson salt in warm water, and magnesium citrate without relive.   We called her PCP [name]) @ [phone] and patient was instructed to contact them back as soon as she left our office for additional recommendations.   Essential hypertension Blood pressure remains low and patient reports 2 recent falls related to her dizziness. Noted she is also taking prazosin for nightmares. This can be a contributing factor for her low BP and dizziness, but we defer adjustment of this medication to his psychiatrist d/t complex phycological profile.   Will STOP lisinopril, HOLD furosemide for 3 days, and allow some sodium back on her diet. Furosemide 58m daily PRN may be resumed after 3 days and used ONLY as needed. She is to continua all other medication as previously prescribed, continue twice daily BP monitoring, and follow up with BAlain Honeyin 5 weeks.     Marshe Shrestha Rodriguez-Guzman PharmD, BCPS, [id number]/08/2018 7:48 AM

## 2019-03-28 NOTE — Patient Instructions (Addendum)
Return for a  follow up appointment in [place] in 5 weeks  Check your blood pressure at home daily (if able) and keep record of the readings.  Take your BP meds as follows: STOP taking lisinopril STAY hydrated and allow some salt in diet  *HOLD furosemide for 3 days, then resume as prescribed*  Bring all of your meds, your BP cuff and your record of home blood pressures to your next appointment.  Exercise as you're able, try to walk approximately 30 minutes per day.  Keep salt intake to a minimum, especially watch canned and prepared boxed foods.  Eat more fresh fruits and vegetables and fewer canned items.  Avoid eating in fast food restaurants.    HOW TO TAKE YOUR BLOOD PRESSURE: . Rest 5 minutes before taking your blood pressure. .  Don't smoke or drink caffeinated beverages for at least 30 minutes before. . Take your blood pressure before (not after) you eat. . Sit comfortably with your back supported and both feet on the floor (don't cross your legs). . Elevate your arm to heart level on a table or a desk. . Use the proper sized cuff. It should fit smoothly and snugly around your bare upper arm. There should be enough room to slip a fingertip under the cuff. The bottom edge of the cuff should be 1 inch above the crease of the elbow. . Ideally, take 3 measurements at one sitting and record the average.

## 2019-03-29 ENCOUNTER — Encounter: Payer: Self-pay | Admitting: Pharmacist

## 2019-03-29 NOTE — Assessment & Plan Note (Signed)
Patient reports last BM 3 days ago. Noted history if IBS, and persistent N/V. She saw her PCP today but forgot to discuss constipation issues. Patient tried  metamucil, dulcolax, enema , and epson salt in warm water, and magnesium citrate without relive.   We called her PCP [name]) @ [phone] and patient was instructed to contact them back as soon as she left our office for additional recommendations.

## 2019-03-29 NOTE — Assessment & Plan Note (Addendum)
Blood pressure remains low and patient reports 2 recent falls related to her dizziness. Noted she is also taking prazosin for nightmares. This can be a contributing factor for her low BP and dizziness, but we defer adjustment of this medication to his psychiatrist d/t complex phycological profile.   Will STOP lisinopril, HOLD furosemide for 3 days, and allow some sodium back on her diet. Furosemide 52m daily PRN may be resumed after 3 days and used ONLY as needed. She is to continua all other medication as previously prescribed, continue twice daily BP monitoring, and follow up with BAlain Honeyin 5 weeks.

## 2019-05-04 ENCOUNTER — Ambulatory Visit: Payer: Self-pay | Admitting: Surgery

## 2019-05-04 NOTE — H&P (Signed)
Surgical H&P  CC: abdominal pain  HPI: this is a very nice 53 year old woman who has a couple of abdominal complaints.  Her initial complaint is that she has severe constipation, and indeed had to spend the night in the hospital 3 weeks ago essentially didn't go lightly prep because she had not had a bowel movement in a month.  Since discharge, and has been 3 weeks and she still has not had a bowel movement and describes myriad of different treatments including mag citrate, MiraLAX twice daily, enemas etc.  She states her last colonoscopy was here in town but does not recall with whom, and it was at least 10 years ago. Her other complaint and reason that she is here today is right upper quadrant pain associated with nausea which is exacerbated by eating, especially fatty meals.this has been going on for about one month. She did have an ultrasound at [facility] on November 11. The report describes multiple stones, gallbladder wall measuring 3.4 mm, positive Murphy sign but no pericholecystic fluid, common bile duct 3 mm, coarsened liver echotexture with no focal mass, portal vein patent with normal direction of flow.  Allergies  Allergen Reactions  . Hydrocodone-Acetaminophen Other (See Comments)    Unknown  . Pentazocine-Naloxone Other (See Comments)    Unknown  . Quinapril Hcl Other (See Comments)    Unknown  . Talwin [Pentazocine]     Past Medical History:  Diagnosis Date  . Depression   . Diabetes mellitus without complication ([place])   . Dysrhythmia   . Edema   . Headache    MIGRAINES  . Hypertension   . Neuromuscular disorder ([place])    NEUROPATHY  . Palpitations   . Wheezing     Past Surgical History:  Procedure Laterality Date  . ABDOMINAL HYSTERECTOMY    . CATARACT EXTRACTION W/PHACO Right 01/11/2018   Procedure: CATARACT EXTRACTION PHACO AND INTRAOCULAR LENS PLACEMENT (IOC);  Surgeon: [name], MD;  Location: [facility] ORS;  Service: Ophthalmology;  Laterality:  Right;  [place] 00:57.5 AP% 15.0 CDE 8.61 Fluid Pack Lot # [id number]  . CATARACT EXTRACTION W/PHACO Left 02/15/2018   Procedure: CATARACT EXTRACTION PHACO AND INTRAOCULAR LENS PLACEMENT (IOC);  Surgeon: [name], MD;  Location: [facility] ORS;  Service: Ophthalmology;  Laterality: Left;  [place] 00:51 AP% 13.2 CDE 6.82 Fluid p ack lot # [id number]  . COLONOSCOPY    . RCR Bilateral     No family history on file.  Social History   Socioeconomic History  . Marital status: Married    Spouse name: Not on file  . Number of children: Not on file  . Years of education: Not on file  . Highest education level: Not on file  Occupational History  . Not on file  Tobacco Use  . Smoking status: Never Smoker  . Smokeless tobacco: Never Used  Substance and Sexual Activity  . Alcohol use: Not Currently  . Drug use: Not on file  . Sexual activity: Not on file  Other Topics Concern  . Not on file  Social History Narrative  . Not on file   Social Determinants of Health   Financial Resource Strain:   . Difficulty of Paying Living Expenses: Not on file  Food Insecurity:   . Worried About Charity fundraiser in the Last Year: Not on file  . Ran Out of Food in the Last Year: Not on file  Transportation Needs:   . Lack of Transportation (Medical): Not on file  .  Lack of Transportation (Non-Medical): Not on file  Physical Activity:   . Days of Exercise per Week: Not on file  . Minutes of Exercise per Session: Not on file  Stress:   . Feeling of Stress : Not on file  Social Connections:   . Frequency of Communication with Friends and Family: Not on file  . Frequency of Social Gatherings with Friends and Family: Not on file  . Attends Religious Services: Not on file  . Active Member of Clubs or Organizations: Not on file  . Attends Archivist Meetings: Not on file  . Marital Status: Not on file    Current Outpatient Medications on File Prior to Visit  Medication Sig Dispense Refill   . acetaminophen (TYLENOL) 500 MG tablet Take 1,500 mg by mouth every 6 (six) hours as needed for moderate pain or headache.    . Brexpiprazole (REXULTI) 2 MG TABS Take 2 mg by mouth at bedtime.    . Empagliflozin-metFORMIN HCl ER (SYNJARDY XR) 25-1000 MG TB24 Take by mouth.    . furosemide (LASIX) 20 MG tablet Take 20 mg by mouth daily as needed. Patient taking every other day    . gabapentin (NEURONTIN) 400 MG capsule Take 400 mg by mouth 3 (three) times daily.    . meclizine (ANTIVERT) 25 MG tablet TAKE 1 2 TABLETS BY MOUTH EVERY 6 HOURS AS NEEDED    . meloxicam (MOBIC) 15 MG tablet Take 15 mg by mouth daily.    . Omega 3 1000 MG CAPS Take 1 capsule by mouth 3 (three) times daily.    . ondansetron (ZOFRAN) 4 MG tablet TAKE 1 TABLET BY MOUTH AS NEEDED FOR NAUSEA MAY REPEAT ONCE AFTER 4 HOURS    . phentermine (ADIPEX-P) 37.5 MG tablet Take 37.5 mg by mouth daily.    . potassium chloride (KLOR-CON) 20 MEQ packet Take by mouth 2 (two) times daily.    . prazosin (MINIPRESS) 2 MG capsule Take 4 mg by mouth at bedtime.    [name] QUEtiapine (SEROQUEL) 400 MG tablet Take 800 mg by mouth at bedtime.    [name] QUEtiapine (SEROQUEL) 50 MG tablet Take 100 mg by mouth daily.    . simvastatin (ZOCOR) 20 MG tablet Take 20 mg by mouth daily.  6  . TIADYLT ER 120 MG 24 hr capsule Take 120 mg by mouth daily.    [name] tiZANidine (ZANAFLEX) 4 MG tablet Take 4 mg by mouth every 6 (six) hours as needed.    . topiramate (TOPAMAX) 100 MG tablet     . traMADol ([name]) 50 MG tablet Take 50 mg by mouth 2 (two) times daily.    . TRULICITY 1.5 QQ/5.9DG SOPN 3 mg.    . vortioxetine HBr ([name]) 20 MG TABS tablet Take 20 mg by mouth at bedtime.    . ziprasidone ([name]) 20 MG capsule Take 20 mg by mouth at bedtime.    [name] zonisamide ([name]) 100 MG capsule Take 200 mg by mouth at bedtime.     No current facility-administered medications on file prior to visit.    Review of Systems: a complete, 10pt review of systems was  completed with pertinent positives and negatives as documented in the HPI  Physical Exam: Vitals  Weight: 178 lb Height: 65in Body Surface Area: 1.88 m Body Mass Index: 29.62 kg/m  Temp.: 97.61F  Pulse: 108 (Regular)  P.OX: 97% (Room air) BP: 114/70 (Sitting, Left Arm, Standard)  Gen: alert and chronically ill appearing Eye: extraocular  motion intact, no scleral icterus Chest: unlabored respirations, symmetrical air entry CV: regular rate and rhythm, no pedal edema Abdomen: soft, obese, mildly tender in right upper quadrant and epigastrium, nondistended. No mass or organomegaly MSK: strength symmetrical throughout, no deformity Neuro: grossly intact, normal gait Psych: normal mood and affect, appropriate insight Skin: warm and dry, no rash or lesion on limited exam   CBC Latest Ref Rng & Units 12/14/2012 08/17/2007  WBC 4.5 - 10.5 10*3/microliter - 6.4  Hemoglobin 12.0 - 16.0 g/dL 14.7 14.6  Hematocrit 36.0 - 46.0 % - 43.5  Platelets 150 - 400 K/uL - 183    CMP Latest Ref Rng & Units 12/14/2012 08/17/2007  Glucose 65 - 99 mg/dL 233(H) 390(H)  BUN 7 - 18 mg/dL 17 8  Creatinine 0.60 - 1.30 mg/dL 0.46(L) 0.5  Sodium 136 - 145 mmol/L 137 137  Potassium 3.5 - 5.1 mmol/L 4.0 4.2  Chloride 98 - 107 mmol/L 104 101  CO2 21 - 32 mmol/L 27 28  Calcium 8.5 - 10.1 mg/dL 9.3 9.1  Total Protein 6.0 - 8.3 g/dL - 7.3  Total Bilirubin 0.3 - 1.2 mg/dL - 0.8  Alkaline Phos 39 - 117 units/L - 108  AST 0 - 37 units/L - 88(H)  ALT 0 - 35 units/L - 80(H)    No results found for: INR, PROTIME  Imaging: No results found.   A/P: BILIARY COLIC (F75.10) Story: I recommend proceeding with laparoscopic cholecystectomy with possible cholangiogram. Discussed risks of surgery including bleeding, pain, scarring, intraabdominal injury specifically to the common bile duct and sequelae, bile leak, conversion to open surgery, failure to resolve symptoms, blood clots/ pulmonary embolus, heart  attack, pneumonia, stroke, death. Patient was advised that surgery and premedication will only aggravate her significant constipation and I do recommend she meet with the gastroenterologist to work on this as well as to consider colonoscopy given that it over 10 years. We will place a referral today. Questions welcomed and answered to patient's satisfaction. She would like to go ahead and schedule her gallbladder surgery.  Patient Active Problem List   Diagnosis Date Noted  . DYSPHAGIA UNSPECIFIED 03/04/2009  . GERD 02/18/2009  . PERSISTENT VOMITING 02/18/2009  . CHEST PAIN 02/18/2009  . NAUSEA AND VOMITING 02/18/2009  . DEHYDRATION 07/19/2007  . GASTROENTERITIS 07/19/2007  . FIBROIDS, UTERUS 05/02/2007  . DEPRESSION 05/02/2007  . MIGRAINE HEADACHE 05/02/2007  . Essential hypertension 05/02/2007  . HEMORRHOIDS 05/02/2007  . Constipation 05/02/2007  . IBS 05/02/2007  . PSORIASIS 05/02/2007  . PITUITARY NEOPLASM, HX OF 05/02/2007  . LIVER FUNCTION TESTS, ABNORMAL, HX OF 05/02/2007  . DIABETES MELLITUS, TYPE II 02/04/2007  . HYPERLIPIDEMIA 02/04/2007  . SINUSITIS, ACUTE 02/04/2007  . ABDOMINAL PAIN, RIGHT UPPER QUADRANT 02/04/2007       Romana Juniper, MD [facility], [place]  See AMION to contact appropriate on-call provider

## 2019-05-12 ENCOUNTER — Other Ambulatory Visit: Payer: Self-pay

## 2019-05-12 ENCOUNTER — Ambulatory Visit (INDEPENDENT_AMBULATORY_CARE_PROVIDER_SITE_OTHER): Payer: Medicare Other | Admitting: Cardiovascular Disease

## 2019-05-12 ENCOUNTER — Encounter: Payer: Self-pay | Admitting: Cardiovascular Disease

## 2019-05-12 ENCOUNTER — Encounter (INDEPENDENT_AMBULATORY_CARE_PROVIDER_SITE_OTHER): Payer: Self-pay

## 2019-05-12 VITALS — BP 100/62 | HR 104 | Temp 97.1°F

## 2019-05-12 DIAGNOSIS — E785 Hyperlipidemia, unspecified: Secondary | ICD-10-CM

## 2019-05-12 DIAGNOSIS — R079 Chest pain, unspecified: Secondary | ICD-10-CM

## 2019-05-12 DIAGNOSIS — Z8249 Family history of ischemic heart disease and other diseases of the circulatory system: Secondary | ICD-10-CM | POA: Diagnosis not present

## 2019-05-12 DIAGNOSIS — I10 Essential (primary) hypertension: Secondary | ICD-10-CM | POA: Diagnosis not present

## 2019-05-12 DIAGNOSIS — R0789 Other chest pain: Secondary | ICD-10-CM

## 2019-05-12 NOTE — Assessment & Plan Note (Signed)
Recent onset of atypical sharp chest pain.  We will get a coronary calcium score to further evaluate

## 2019-05-12 NOTE — Assessment & Plan Note (Signed)
History of hyperlipidemia hyperlipidemia on simvastatin.  Followed by her PCP.

## 2019-05-12 NOTE — Assessment & Plan Note (Signed)
History of essential hypertension was symptomatic hypotension previously on lisinopril and diltiazem.  She was on prazosin at night for "nightmares".  Her lisinopril was discontinued.  Her blood pressure log shows her blood pressure is somewhat better without episodes of hypotension.

## 2019-05-12 NOTE — Progress Notes (Signed)
05/12/2019 Kim Hicks   07/03/1965  [id number]  Primary Physician Kim Hicks, [place] Primary Cardiologist: Kim Hicks Kim Hicks, Kim Hicks  HPI:  Kim Hicks is a 53 y.o.  moderately overweight married Caucasian female mother of 1 child, grandmother of 2 grandchildren referred by Dr. Orie Hicks, her headache doctor for evaluation of symptomatic hypotension.  I last saw her in the office 02/14/2019. She is currently on disability for severe depression.  She has a history of treated hypertension, diabetes and hyperlipidemia.  Her mother did have a myocardial infarction.  She is never had a heart attack or stroke.  Recently she is noticed symptomatic hypotension with dizziness and 2 episodes of syncope which she describes.  We had stopped her lisinopril and her blood pressures have slowly risen.  She said no dizzy or syncopal episodes since that time.  She does complain of some atypical sharp chest pain however beginning within the last month.   Current Meds  Medication Sig  . acetaminophen (TYLENOL) 500 MG tablet Take 1,500 mg by mouth every 6 (six) hours as needed for moderate pain or headache.  . Brexpiprazole (REXULTI) 2 MG TABS Take 2 mg by mouth at bedtime.  . Empagliflozin-metFORMIN HCl Hicks (SYNJARDY XR) 25-1000 MG TB24 Take by mouth.  . furosemide (LASIX) 20 MG tablet Take 20 mg by mouth daily as needed. Patient taking every other day  . gabapentin (NEURONTIN) 400 MG capsule Take 400 mg by mouth 3 (three) times daily.  . meclizine (ANTIVERT) 25 MG tablet TAKE 1 2 TABLETS BY MOUTH EVERY 6 HOURS AS NEEDED  . meloxicam (MOBIC) 15 MG tablet Take 15 mg by mouth daily.  . Omega 3 1000 MG CAPS Take 1 capsule by mouth 3 (three) times daily.  . ondansetron (ZOFRAN) 4 MG tablet TAKE 1 TABLET BY MOUTH AS NEEDED FOR NAUSEA MAY REPEAT ONCE AFTER 4 HOURS  . phentermine (ADIPEX-P) 37.5 MG tablet Take 37.5 mg by mouth daily.  . potassium chloride (KLOR-CON) 20 MEQ packet Take  by mouth 2 (two) times daily.  . prazosin (MINIPRESS) 2 MG capsule Take 4 mg by mouth at bedtime.  [name] QUEtiapine (SEROQUEL) 400 MG tablet Take 800 mg by mouth at bedtime.  [name] QUEtiapine (SEROQUEL) 50 MG tablet Take 100 mg by mouth daily.  . simvastatin (ZOCOR) 20 MG tablet Take 20 mg by mouth daily.  Kim Hicks 120 MG 24 hr capsule Take 120 mg by mouth daily.  [name] tiZANidine (ZANAFLEX) 4 MG tablet Take 4 mg by mouth every 6 (six) hours as needed.  . topiramate (TOPAMAX) 100 MG tablet   . traMADol ([name]) 50 MG tablet Take 50 mg by mouth 2 (two) times daily.  . TRULICITY 1.5 HE/1.7EY SOPN 3 mg.  . vortioxetine HBr ([name]) 20 MG TABS tablet Take 20 mg by mouth at bedtime.  . ziprasidone ([name]) 20 MG capsule Take 20 mg by mouth at bedtime.  [name] zonisamide ([name]) 100 MG capsule Take 200 mg by mouth at bedtime.     Allergies  Allergen Reactions  . Hydrocodone-Acetaminophen Other (See Comments)    Unknown  . Pentazocine-Naloxone Other (See Comments)    Unknown  . Quinapril Hcl Other (See Comments)    Unknown  . Talwin [Pentazocine]     Social History   Socioeconomic History  . Marital status: Married    Spouse name: Not on file  . Number of children: Not on file  . Years of  education: Not on file  . Highest education level: Not on file  Occupational History  . Not on file  Tobacco Use  . Smoking status: Never Smoker  . Smokeless tobacco: Never Used  Substance and Sexual Activity  . Alcohol use: Not Currently  . Drug use: Not on file  . Sexual activity: Not on file  Other Topics Concern  . Not on file  Social History Narrative  . Not on file   Social Determinants of Health   Financial Resource Strain:   . Difficulty of Paying Living Expenses: Not on file  Food Insecurity:   . Worried About Charity fundraiser in the Last Year: Not on file  . Ran Out of Food in the Last Year: Not on file  Transportation Needs:   . Lack of Transportation (Medical): Not on file    . Lack of Transportation (Non-Medical): Not on file  Physical Activity:   . Days of Exercise per Week: Not on file  . Minutes of Exercise per Session: Not on file  Stress:   . Feeling of Stress : Not on file  Social Connections:   . Frequency of Communication with Friends and Family: Not on file  . Frequency of Social Gatherings with Friends and Family: Not on file  . Attends Religious Services: Not on file  . Active Member of Clubs or Organizations: Not on file  . Attends Archivist Meetings: Not on file  . Marital Status: Not on file  Intimate Partner Violence:   . Fear of Current or Ex-Partner: Not on file  . Emotionally Abused: Not on file  . Physically Abused: Not on file  . Sexually Abused: Not on file     Review of Systems: General: negative for chills, fever, night sweats or weight changes.  Cardiovascular: negative for chest pain, dyspnea on exertion, edema, orthopnea, palpitations, paroxysmal nocturnal dyspnea or shortness of breath Dermatological: negative for rash Respiratory: negative for cough or wheezing Urologic: negative for hematuria Abdominal: negative for nausea, vomiting, diarrhea, bright red blood per rectum, melena, or hematemesis Neurologic: negative for visual changes, syncope, or dizziness All other systems reviewed and are otherwise negative except as noted above.    Blood pressure 100/62, pulse (!) 104, temperature (!) 97.1 F (36.2 C), SpO2 97 %.  General appearance: alert and no distress Neck: no adenopathy, no carotid bruit, no JVD, supple, symmetrical, trachea midline and thyroid not enlarged, symmetric, no tenderness/mass/nodules Lungs: clear to auscultation bilaterally Heart: regular rate and rhythm, S1, S2 normal, no murmur, click, rub or gallop Extremities: extremities normal, atraumatic, no cyanosis or edema Pulses: 2+ and symmetric Skin: Skin color, texture, turgor normal. No rashes or lesions Neurologic: Alert and oriented X  3, normal strength and tone. Normal symmetric reflexes. Normal coordination and gait  EKG not performed today  ASSESSMENT AND PLAN:   HYPERLIPIDEMIA History of hyperlipidemia hyperlipidemia on simvastatin.  Followed by her PCP.  Essential hypertension History of essential hypertension was symptomatic hypotension previously on lisinopril and diltiazem.  She was on prazosin at night for "nightmares".  Her lisinopril was discontinued.  Her blood pressure log shows her blood pressure is somewhat better without episodes of hypotension.  CHEST PAIN Recent onset of atypical sharp chest pain.  We will get a coronary calcium score to further evaluate      Kim Hicks [facility], [facility] 05/12/2019 2:20 PM

## 2019-05-12 NOTE — Patient Instructions (Signed)
Medication Instructions:  Your physician recommends that you continue on your current medications as directed. Please refer to the Current Medication list given to you today.  *If you need a refill on your cardiac medications before your next appointment, please call your pharmacy*  Lab Work: NONE ordered at this time of appointment   If you have labs (blood work) drawn today and your tests are completely normal, you will receive your results only by: [name] MyChart Message (if you have MyChart) OR . A paper copy in the mail If you have any lab test that is abnormal or we need to change your treatment, we will call you to review the results.  Testing/Procedures: Your physician has requested that you have calcium score test   PLEASE SCHEDULE FOR 2-3 WEEKS  Follow-Up: At [facility], you and your health needs are our priority.  As part of our continuing mission to provide you with exceptional heart care, we have created designated Provider Care Teams.  These Care Teams include your primary Cardiologist (physician) and Advanced Practice Providers (APPs -  Physician Assistants and Nurse Practitioners) who all work together to provide you with the care you need, when you need it.  Your next appointment:   6 week(s) 3 MONTHS  The format for your next appointment:   In [place]  Provider:   You may see    Kerin Ransom, PA-C  Sande Rives, PA-C  Coletta Memos, [place]  Quay Burow, MD  Other Instructions

## 2019-05-16 ENCOUNTER — Other Ambulatory Visit: Payer: Self-pay

## 2019-05-16 ENCOUNTER — Ambulatory Visit (INDEPENDENT_AMBULATORY_CARE_PROVIDER_SITE_OTHER): Payer: Medicare Other | Admitting: Neurology

## 2019-05-16 ENCOUNTER — Encounter: Payer: Self-pay | Admitting: Neurology

## 2019-05-16 VITALS — BP 124/77 | HR 105 | Temp 97.2°F | Ht 65.0 in | Wt 174.5 lb

## 2019-05-16 DIAGNOSIS — E538 Deficiency of other specified B group vitamins: Secondary | ICD-10-CM

## 2019-05-16 DIAGNOSIS — E1142 Type 2 diabetes mellitus with diabetic polyneuropathy: Secondary | ICD-10-CM | POA: Diagnosis not present

## 2019-05-16 DIAGNOSIS — R269 Unspecified abnormalities of gait and mobility: Secondary | ICD-10-CM

## 2019-05-16 NOTE — Progress Notes (Signed)
Reason for visit: Diabetic peripheral neuropathy  Referring physician: Dr. Beatrix Shipper is a 53 y.o. female  History of present illness:  Ms. Sanon is a 53 year old right-handed white female with a 64 or 16-year history of diabetes.  The patient has at least an 52-monthhistory of some sensation of numbness in the hands and feet with some weakness in the hands.  The patient has noted a change in balance, she will stumble or fall on occasion.  She did fall this morning before the office visit.  The patient is now using a cane for ambulation of the last 2 months to help with walking safety.  She reports some discomfort in the feet, she does not sleep well at night often times because of this.  She currently is on gabapentin taking 400 mg 3 times daily.  The patient reports some neck and shoulder discomfort but denies any radicular pain down the arms on either side.  She denies any low back pain or difficulty controlling the bowels or the bladder.  She is followed through Dr. FDomingo Cockingfor headaches, she apparently had nerve conduction studies done recently on arms and legs that showed evidence of a relatively severe sensorimotor axonal and demyelinating neuropathy.  The distal motor latencies for the peroneal and posterior tibial nerves were normal bilaterally.  The sural sensory responses were prolonged with reduced amplitudes and the saphenous sensory responses were absent.  In the arms, the patient has bilateral distal median and ulnar involvement.  EMG evaluation apparently on the arms were normal EMG on the lower extremities were also normal with exception of some neuropathic denervation of the tibialis anterior and extensor digitorum brevis muscles.  The patient is sent to this office for an evaluation.  The patient does have a resting tremor involving the left upper extremity, she is on [name] for severe depression, she is on disability for depression.  Past Medical History:  Diagnosis  Date  . Depression   . Diabetes mellitus without complication ([place]   . Dysrhythmia   . Edema   . Headache    MIGRAINES  . Hypertension   . Neuromuscular disorder ([place]    NEUROPATHY  . Palpitations   . Wheezing     Past Surgical History:  Procedure Laterality Date  . ABDOMINAL HYSTERECTOMY    . CATARACT EXTRACTION W/PHACO Right 01/11/2018   Procedure: CATARACT EXTRACTION PHACO AND INTRAOCULAR LENS PLACEMENT (IOC);  Surgeon: PBirder Robson MD;  Location: [facility] ORS;  Service: Ophthalmology;  Laterality: Right;  [place]:57.5 AP% 15.0 CDE 8.61 Fluid Pack Lot # [id number]  . CATARACT EXTRACTION W/PHACO Left 02/15/2018   Procedure: CATARACT EXTRACTION PHACO AND INTRAOCULAR LENS PLACEMENT (IOC);  Surgeon: PBirder Robson MD;  Location: [facility] ORS;  Service: Ophthalmology;  Laterality: Left;  [place]:51 AP% 13.2 CDE 6.82 Fluid p ack lot # [id number]  . COLONOSCOPY    . RCR Bilateral     History reviewed. No pertinent family history.  Social history:  reports that she has never smoked. She has never used smokeless tobacco. She reports previous alcohol use. She reports previous drug use.  Medications:  Prior to Admission medications   Medication Sig Start Date End Date Taking? Authorizing Provider  acetaminophen (TYLENOL) 500 MG tablet Take 1,500 mg by mouth every 6 (six) hours as needed for moderate pain or headache.   Yes [provider]  Brexpiprazole (REXULTI) 2 MG TABS Take 2 mg by mouth at bedtime.   Yes [provider]  diltiazem (DILTIAZEM CD) 120 MG 24 hr capsule Take 120 mg by mouth daily. Take 1 tablet by mouth daily   Yes [provider]  Empagliflozin-metFORMIN HCl ER (SYNJARDY XR) 25-1000 MG TB24 Take by mouth. 11/15/18  Yes [provider]  furosemide (LASIX) 20 MG tablet Take 20 mg by mouth daily as needed. Patient taking every other day   Yes [provider]  gabapentin (NEURONTIN) 400 MG capsule Take 400 mg by mouth 3 (three)  times daily. 02/08/19  Yes [provider]  meclizine (ANTIVERT) 25 MG tablet TAKE 1 2 TABLETS BY MOUTH EVERY 6 HOURS AS NEEDED 11/29/18  Yes [provider]  meloxicam (MOBIC) 15 MG tablet Take 15 mg by mouth daily.   Yes [provider]  Omega 3 1000 MG CAPS Take 1 capsule by mouth 3 (three) times daily.   Yes [provider]  ondansetron (ZOFRAN) 4 MG tablet TAKE 1 TABLET BY MOUTH AS NEEDED FOR NAUSEA MAY REPEAT ONCE AFTER 4 HOURS 12/02/18  Yes [provider]  potassium chloride (KLOR-CON) 20 MEQ packet Take by mouth 2 (two) times daily.   Yes [provider]  prazosin (MINIPRESS) 2 MG capsule Take 4 mg by mouth at bedtime.   Yes [provider]  QUEtiapine (SEROQUEL) 400 MG tablet Take 800 mg by mouth at bedtime.   Yes [provider]  QUEtiapine (SEROQUEL) 50 MG tablet Take 100 mg by mouth daily.   Yes [provider]  simvastatin (ZOCOR) 20 MG tablet Take 20 mg by mouth daily. 12/13/17  Yes [provider]  TIADYLT ER 120 MG 24 hr capsule Take 120 mg by mouth daily. 01/17/19  Yes [provider]  tiZANidine (ZANAFLEX) 4 MG tablet Take 4 mg by mouth every 6 (six) hours as needed. 01/27/19  Yes [provider]  topiramate (TOPAMAX) 100 MG tablet 3 (three) times daily.  02/06/19  Yes [provider]  traMADol ([name]) 50 MG tablet Take 50 mg by mouth 2 (two) times daily. 01/13/19  Yes [provider]  TRULICITY 1.5 PP/5.0DT SOPN 3 mg once a week.  12/20/18  Yes [provider]  vortioxetine HBr ([name]) 20 MG TABS tablet Take 20 mg by mouth at bedtime.   Yes [provider]  ziprasidone ([name]) 20 MG capsule Take 20 mg by mouth at bedtime.   Yes [provider]  zonisamide ([name]) 100 MG capsule Take 200 mg by mouth at bedtime.   Yes [provider]      Allergies  Allergen Reactions  . Talwin [Pentazocine]     ROS:  Out of a  complete 14 system review of symptoms, the patient complains only of the following symptoms, and all other reviewed systems are negative.  Walking difficulty Numbness Neck pain  Blood pressure 124/77, pulse (!) 105, temperature (!) 97.2 F (36.2 C), height 5' 5"  (1.651 m), weight 174 lb 8 oz (79.2 kg).  Physical Exam  General: The patient is alert and cooperative at the time of the examination.  The patient is moderately obese.  The patient has a flat affect.  Eyes: Pupils are equal, round, and reactive to light. Discs are flat bilaterally.  Neck: The neck is supple, no carotid bruits are noted.  Respiratory: The respiratory examination is clear.  Cardiovascular: The cardiovascular examination reveals a regular rate and rhythm, no obvious murmurs or rubs are noted.  Skin: Extremities are without significant edema.  Neurologic Exam  Mental status: The patient is alert and oriented x 3 at the time of the examination. The patient has apparent normal recent and remote memory, with an apparently normal attention span and concentration ability.  Cranial nerves: Facial symmetry is present. There is good sensation of the face to pinprick and soft touch bilaterally. The strength of the facial muscles and the muscles to head turning and shoulder shrug are normal bilaterally. Speech is well enunciated, no aphasia or dysarthria is noted. Extraocular movements are full. Visual fields are full. The tongue is midline, and the patient has symmetric elevation of the soft palate. No obvious hearing deficits are noted.  Motor: The motor testing reveals 5 over 5 strength of all 4 extremities, with exception of weakness of the intrinsic muscles on both hands, wasting of the first dorsal interosseous muscle is noted on the right. Good symmetric motor tone is noted throughout.  Sensory: Sensory testing is intact to pinprick, soft touch, vibration sensation, and position sense on the upper extremities.  With  the lower extremities there is a stocking pattern pinprick sensory deficit to the knees bilaterally, decreased position sensation is noted on the right foot relative to the left, vibration sensation in the feet is symmetric.  No evidence of extinction is noted.  Coordination: Cerebellar testing reveals good finger-nose-finger and heel-to-shin bilaterally.  A resting tremors noted on the left upper extremity.  Gait and station: Gait is slightly wide-based, the patient only uses a cane for ambulation.  She is able to walk independently.  Tandem gait is relatively unremarkable.  Romberg is negative.  No drift is seen.  Reflexes: Deep tendon reflexes are symmetric, but are absent bilaterally. Toes are downgoing bilaterally.   Assessment/Plan:  1.  Diabetic peripheral neuropathy  2.  Mild gait disturbance  The patient will be sent for blood work today looking for other etiologies of neuropathy.  The patient claims that she has had onset of symptoms over the last 8 months.  The nerve conduction evaluation is not fully consistent with CIDP with normal distal motor latencies in the lower extremities, but the patient is areflexic.  There is wasting of the first dorsal interosseous muscle on the right.  The patient is not getting full pain control from the gabapentin, we may consider switching over to Lyrica in the future.  She will be set up for physical therapy for gait training.  She will follow-up here in 4 months.  Jill Alexanders MD 05/16/2019 11:33 AM  [facility] [address] [place], [place] [address]  Phone [phone] Fax [phone]

## 2019-05-18 LAB — MULTIPLE MYELOMA PANEL, SERUM
Albumin SerPl Elph-Mcnc: 4 g/dL (ref 2.9–4.4)
Albumin/Glob SerPl: 1.4 (ref 0.7–1.7)
Alpha 1: 0.2 g/dL (ref 0.0–0.4)
Alpha2 Glob SerPl Elph-Mcnc: 0.8 g/dL (ref 0.4–1.0)
B-Globulin SerPl Elph-Mcnc: 1.4 g/dL — ABNORMAL HIGH (ref 0.7–1.3)
Gamma Glob SerPl Elph-Mcnc: 0.6 g/dL (ref 0.4–1.8)
Globulin, Total: 3 g/dL (ref 2.2–3.9)
IgA/Immunoglobulin A, Serum: 405 mg/dL — ABNORMAL HIGH (ref 87–352)
IgG (Immunoglobin G), Serum: 649 mg/dL (ref 586–1602)
IgM (Immunoglobulin M), Srm: 22 mg/dL — ABNORMAL LOW (ref 26–217)
Total Protein: 7 g/dL (ref 6.0–8.5)

## 2019-05-18 LAB — ANA W/REFLEX: Anti Nuclear Antibody (ANA): NEGATIVE

## 2019-05-18 LAB — ANGIOTENSIN CONVERTING ENZYME: Angio Convert Enzyme: 5 U/L — ABNORMAL LOW (ref 14–82)

## 2019-05-18 LAB — B. BURGDORFI ANTIBODIES: Lyme IgG/IgM Ab: <0.91 {ISR} (ref 0.00–0.90)

## 2019-05-18 LAB — SEDIMENTATION RATE: Sed Rate: 25 mm/hr (ref 0–40)

## 2019-05-18 LAB — VITAMIN B12: Vitamin B-12: >2000 pg/mL — ABNORMAL HIGH (ref 232–1245)

## 2019-05-18 LAB — RHEUMATOID FACTOR: Rheumatoid fact SerPl-aCnc: <10 IU/mL (ref 0.0–13.9)

## 2019-05-22 ENCOUNTER — Telehealth: Payer: Self-pay

## 2019-05-22 NOTE — Telephone Encounter (Signed)
I reached out to the pt and advised of results. She verbalized understanding.

## 2019-05-22 NOTE — Telephone Encounter (Signed)
-----   Message from Kathrynn Ducking, MD sent at 05/18/2019  3:02 PM EST -----  The blood work results are unremarkable. Please call the patient. ----- Message ----- From: Lavone Neri Lab Results In Sent: 05/17/2019   7:37 AM EST To: Kathrynn Ducking, MD

## 2019-05-31 ENCOUNTER — Other Ambulatory Visit: Payer: Medicare Other

## 2019-06-06 ENCOUNTER — Encounter (HOSPITAL_BASED_OUTPATIENT_CLINIC_OR_DEPARTMENT_OTHER): Payer: Self-pay | Admitting: Physician Assistant

## 2019-06-14 ENCOUNTER — Ambulatory Visit (INDEPENDENT_AMBULATORY_CARE_PROVIDER_SITE_OTHER): Payer: Medicare Other | Admitting: Gastroenterology

## 2019-06-14 ENCOUNTER — Encounter: Payer: Self-pay | Admitting: Gastroenterology

## 2019-06-14 VITALS — BP 100/60 | HR 80 | Temp 97.9°F | Ht 65.0 in | Wt 174.0 lb

## 2019-06-14 DIAGNOSIS — K5900 Constipation, unspecified: Secondary | ICD-10-CM | POA: Diagnosis not present

## 2019-06-14 DIAGNOSIS — Z01818 Encounter for other preprocedural examination: Secondary | ICD-10-CM | POA: Diagnosis not present

## 2019-06-14 MED ORDER — NA SULFATE-K SULFATE-MG SULF 17.5-3.13-1.6 GM/177ML PO SOLN: 1.0000 | Freq: Once | ORAL | 0 refills | Status: AC

## 2019-06-14 MED ORDER — LINACLOTIDE 290 MCG PO CAPS: 290.0000 ug | ORAL_CAPSULE | Freq: Every day | ORAL | 11 refills | Status: DC

## 2019-06-14 NOTE — Patient Instructions (Signed)
Dr Fuller Plan recommends that you complete a bowel purge (to clean out your bowels). Remain on clear liquid dinner and while you start bowel purge. Follow instructions on Plenvu box for the one day dosing. After you have finished the dose 1, wait 2 hours and do dose 2. You should expect results within 1 to 6 hours after completing the bowel purge.  Please call our office if your bowel purge does not help.   Start Linzess 290 mcg samples daily after bowel purge. A prescription has been sent to your pharmacy.   You have been scheduled for a colonoscopy. Please follow written instructions given to you at your visit today.  Please pick up your prep supplies at the pharmacy within the next 1-3 days. If you use inhalers (even only as needed), please bring them with you on the day of your procedure.  Thank you for choosing me and [facility].  Pricilla Riffle. Dagoberto Ligas., MD., Marval Regal

## 2019-06-14 NOTE — Progress Notes (Addendum)
History of Present Illness: This is a 54 year old female referred by Kim Riley, MD for the evaluation of constipation.  She has a history of IBS with alternating diarrhea and constipation and was previously evaluated in 2006 and 2010.  Colonoscopy in 2006 was normal.  EGD in 2010 showed gastritis.  Over the past number of years she has had only problems with constipation which has progressively worsened over time.  She can go more than 1 or 2 weeks without bowel movements on occasion.  She has tried MiraLAX, Senokot and magnesium supplements with limited success.  She notes abdominal distention, abdominal bloating and generalized abdominal discomfort that improves following bowel movements.  She relates hospitalization in [facility] for 2 days due to severe constipation and was treated with GoLYTELY bowel purge with eventual improvement of symptoms.  She has symptomatic cholelithiasis and a cholecystectomy is planned on January 29 by Dr. Windle Guard.  Abdominal ultrasound [facility] on November 11 showed multiple gallstones, gallbladder wall thickening at 3.4 mm without pericholecystic fluid, a common bile duct of 3 mm and a coarsened hepatic echotexture.  She relates occasional small amounts of bright red blood per rectum when straining with bowel movements. Denies weight loss, diarrhea, change in stool caliber, melena, hematochezia, nausea, vomiting, dysphagia, reflux symptoms, chest pain.     Allergies  Allergen Reactions  . Talwin [Pentazocine]    Outpatient Medications Prior to Visit  Medication Sig Dispense Refill  . acetaminophen (TYLENOL) 500 MG tablet Take 1,500 mg by mouth every 6 (six) hours as needed for moderate pain or headache.    . Brexpiprazole (REXULTI) 2 MG TABS Take 2 mg by mouth at bedtime.    [name] diltiazem (DILTIAZEM CD) 120 MG 24 hr capsule Take 120 mg by mouth daily. Take 1 tablet by mouth daily    . Empagliflozin-metFORMIN HCl ER (SYNJARDY XR) 25-1000 MG TB24  Take by mouth.    . furosemide (LASIX) 20 MG tablet Take 20 mg by mouth daily as needed. Patient taking every other day    . gabapentin (NEURONTIN) 400 MG capsule Take 400 mg by mouth 3 (three) times daily.    . meclizine (ANTIVERT) 25 MG tablet TAKE 1 2 TABLETS BY MOUTH EVERY 6 HOURS AS NEEDED    . meloxicam (MOBIC) 15 MG tablet Take 15 mg by mouth daily.    . Omega 3 1000 MG CAPS Take 1 capsule by mouth 3 (three) times daily.    . ondansetron (ZOFRAN) 4 MG tablet TAKE 1 TABLET BY MOUTH AS NEEDED FOR NAUSEA MAY REPEAT ONCE AFTER 4 HOURS    . potassium chloride (KLOR-CON) 20 MEQ packet Take by mouth 2 (two) times daily.    . prazosin (MINIPRESS) 2 MG capsule Take 4 mg by mouth at bedtime.    [name] QUEtiapine (SEROQUEL) 400 MG tablet Take 800 mg by mouth at bedtime.    [name] QUEtiapine (SEROQUEL) 50 MG tablet Take 100 mg by mouth daily.    . simvastatin (ZOCOR) 20 MG tablet Take 20 mg by mouth daily.  6  . TIADYLT ER 120 MG 24 hr capsule Take 120 mg by mouth daily.    [name] tiZANidine (ZANAFLEX) 4 MG tablet Take 4 mg by mouth every 6 (six) hours as needed.    . topiramate (TOPAMAX) 100 MG tablet 3 (three) times daily.     . traMADol ([name]) 50 MG tablet Take 50 mg by mouth 2 (two) times daily.    [name]  TRULICITY 1.5 MC/9.4BS SOPN 3 mg once a week.     . vortioxetine HBr ([name]) 20 MG TABS tablet Take 20 mg by mouth at bedtime.    . ziprasidone ([name]) 20 MG capsule Take 20 mg by mouth at bedtime.    [name] zonisamide ([name]) 100 MG capsule Take 200 mg by mouth at bedtime.     No facility-administered medications prior to visit.   Past Medical History:  Diagnosis Date  . Depression   . Diabetes mellitus without complication ([place])   . Dysrhythmia   . Edema   . Headache    MIGRAINES  . Hypertension   . Neuromuscular disorder ([place])    NEUROPATHY  . Palpitations   . Wheezing    Past Surgical History:  Procedure Laterality Date  . ABDOMINAL HYSTERECTOMY    . CATARACT EXTRACTION W/PHACO Right  01/11/2018   Procedure: CATARACT EXTRACTION PHACO AND INTRAOCULAR LENS PLACEMENT (IOC);  Surgeon: [name], MD;  Location: [facility] ORS;  Service: Ophthalmology;  Laterality: Right;  [place] 00:57.5 AP% 15.0 CDE 8.61 Fluid Pack Lot # [id number]  . CATARACT EXTRACTION W/PHACO Left 02/15/2018   Procedure: CATARACT EXTRACTION PHACO AND INTRAOCULAR LENS PLACEMENT (IOC);  Surgeon: [name], MD;  Location: [facility] ORS;  Service: Ophthalmology;  Laterality: Left;  [place] 00:51 AP% 13.2 CDE 6.82 Fluid p ack lot # [id number]  . COLONOSCOPY    . RCR Bilateral    Social History   Socioeconomic History  . Marital status: Married    Spouse name: Not on file  . Number of children: Not on file  . Years of education: Not on file  . Highest education level: Not on file  Occupational History  . Not on file  Tobacco Use  . Smoking status: Never Smoker  . Smokeless tobacco: Never Used  Substance and Sexual Activity  . Alcohol use: Not Currently  . Drug use: Not Currently  . Sexual activity: Not on file  Other Topics Concern  . Not on file  Social History Narrative  . Not on file   Social Determinants of Health   Financial Resource Strain:   . Difficulty of Paying Living Expenses: Not on file  Food Insecurity:   . Worried About Charity fundraiser in the Last Year: Not on file  . Ran Out of Food in the Last Year: Not on file  Transportation Needs:   . Lack of Transportation (Medical): Not on file  . Lack of Transportation (Non-Medical): Not on file  Physical Activity:   . Days of Exercise per Week: Not on file  . Minutes of Exercise per Session: Not on file  Stress:   . Feeling of Stress : Not on file  Social Connections:   . Frequency of Communication with Friends and Family: Not on file  . Frequency of Social Gatherings with Friends and Family: Not on file  . Attends Religious Services: Not on file  . Active Member of Clubs or Organizations: Not on file  . Attends Archivist  Meetings: Not on file  . Marital Status: Not on file   Family History  Problem Relation Age of Onset  . Diabetes Mother   . Heart failure Mother   . Depression Mother       Review of Systems: Pertinent positive and negative review of systems were noted in the above HPI section. All other review of systems were otherwise negative.   Physical Exam: General: Well developed, well nourished, no acute distress Head:  Normocephalic and atraumatic Eyes:  sclerae anicteric, EOMI Ears: Normal auditory acuity Mouth: No deformity or lesions Neck: Supple, no masses or thyromegaly Lungs: Clear throughout to auscultation Heart: Regular rate and rhythm; no murmurs, rubs or bruits Abdomen: Soft, non tender and mildly distended distended. No masses, hepatosplenomegaly or hernias noted. Normal Bowel sounds Rectal: Deferred to colonoscopy Musculoskeletal: Symmetrical with no gross deformities  Skin: No lesions on visible extremities Pulses:  Normal pulses noted Extremities: No clubbing, cyanosis, edema or deformities noted Neurological: Alert oriented x 4, grossly nonfocal Cervical Nodes:  No significant cervical adenopathy Inguinal Nodes: No significant inguinal adenopathy Psychological:  Alert and cooperative. Normal mood and affect   Assessment and Recommendations:  1.  Severe constipation progressively worsening over the years.  Several of her current medications could be exacerbating her constipation including diltiazem, gabapentin, Seroquel, prazosin, meclizine and adjustments or changes may be required pending response to Linzess.  Bowel purge with Suprep and clear liquid diet for 1 to 2 days until complete bowel movement.  If not completely effective repeat Suprep or use 2-4 bottles of magnesium citrate over several hours.  Following bowel purge begin Linzess 290 mcg po qd.  She is advised to call if the bowel purge is not effective or Linzess is not effective.  Schedule colonoscopy. The  risks (including bleeding, perforation, infection, missed lesions, medication reactions and possible hospitalization or surgery if complications occur), benefits, and alternatives to colonoscopy with possible biopsy and possible polypectomy were discussed with the patient and they consent to proceed.   2.  Symptomatic cholelithiasis.  Laparoscopic cholecystectomy planned by Dr. Romana Juniper on January 29.   cc: Kim Riley, MD [address] [place],  [place] [address]

## 2019-06-15 NOTE — Progress Notes (Addendum)
INSTRUCTIONS FOR HIBICLENS (CHG) SHOWER PRIOR TO SURGERY ON Friday 06-23-2019    [facility] - Preparing for Surgery Before surgery, you can play an important role.  Because skin is not sterile, your skin needs to be as free of germs as possible.  You can reduce the number of germs on your skin by washing with CHG (chlorahexidine gluconate) soap before surgery.  CHG is an antiseptic cleaner which kills germs and bonds with the skin to continue killing germs even after washing. Please DO NOT use if you have an allergy to CHG or antibacterial soaps.  If your skin becomes reddened/irritated stop using the CHG and inform your nurse when you arrive at Short Stay. Do not shave (including legs and underarms) for at least 48 hours prior to the first CHG shower.  You may shave your face/neck. Please follow these instructions carefully:  1.  Shower with CHG Soap the night before surgery and the  morning of Surgery.  2.  If you choose to wash your hair, wash your hair first as usual with your  normal  shampoo.  3.  After you shampoo, rinse your hair and body thoroughly to remove the  shampoo.                   4.  Use CHG as you would any other liquid soap.  You can apply chg directly  to the skin and wash Gently with a scrungie or clean washcloth.  5.  Apply the CHG Soap to your body ONLY FROM THE NECK DOWN.   Do not use on face/ open  Wound or open sores. Avoid contact with eyes, ears mouth and genitals (private parts).  Wash face,  Genitals (private parts) with your normal soap.             6.  Wash thoroughly, paying special attention to the area where your surgery  will be performed.  7.  Thoroughly rinse your body with warm water from the neck down.  8.  DO NOT shower/wash with your normal soap after using and rinsing off  the CHG Soap.             9.  Pat yourself dry with a clean towel.            10.  Wear clean pajamas.            11.  Place clean sheets on your bed the night of your first shower  and do not  sleep with pets. Day of Surgery : Do not apply any lotions/deodorants the morning of surgery.  Please wear clean clothes to the hospital/surgery center.

## 2019-06-15 NOTE — Progress Notes (Signed)
Pt has covid test appointment for Monday 06-19-2019 for colonoscopy scheduled for 06-21-2019. So, pt's covid test appointment that made from Dr Windle Guard office on Tuesday 06-20-2019 has been moved to Thursday 06-22-2019 for surgery on 06-23-2019 @[facility] .

## 2019-06-19 ENCOUNTER — Ambulatory Visit (INDEPENDENT_AMBULATORY_CARE_PROVIDER_SITE_OTHER): Payer: Medicare Other

## 2019-06-19 DIAGNOSIS — Z1159 Encounter for screening for other viral diseases: Secondary | ICD-10-CM

## 2019-06-20 ENCOUNTER — Other Ambulatory Visit: Payer: Self-pay

## 2019-06-20 ENCOUNTER — Other Ambulatory Visit (HOSPITAL_COMMUNITY): Payer: Medicare Other

## 2019-06-20 ENCOUNTER — Encounter (HOSPITAL_BASED_OUTPATIENT_CLINIC_OR_DEPARTMENT_OTHER): Payer: Self-pay | Admitting: Surgery

## 2019-06-20 LAB — SARS CORONAVIRUS 2 (TAT 6-24 HRS): SARS Coronavirus 2: NEGATIVE

## 2019-06-20 NOTE — Progress Notes (Addendum)
Spoke w/ via phone for pre-op interview---Kim Hicks needs dos---- n/a             Hicks results------CBC w/diff., CMP drawn on 06/22/2019  EKG-09/25/2018 chart COVID test ------06/22/2019 for surgery on 06/23/2019 Arrive at -------0700 am 06/23/2019 NPO after ------midnight Medications to take morning of surgery -----Tramadol ([name]), Topiramate (Topamax), Simvastatin (Zocor), Quetiapine (Seroquel), Tiadylt ER and NO Diabetic medications the morning of surgery Diabetic medication -----none the morning of surgery 06/23/2019 Patient Special Instructions -----instructions given to patient for Hibiclens wash Pre-Op special Istructions ----- Patient verbalized understanding of instructions that were given at this phone interview. Patient denies shortness of breath, chest pain, fever, cough a this phone interview.

## 2019-06-21 ENCOUNTER — Ambulatory Visit (AMBULATORY_SURGERY_CENTER): Payer: Medicare Other | Admitting: Gastroenterology

## 2019-06-21 ENCOUNTER — Encounter: Payer: Self-pay | Admitting: Gastroenterology

## 2019-06-21 ENCOUNTER — Ambulatory Visit: Payer: Medicare Other | Admitting: General Practice

## 2019-06-21 ENCOUNTER — Encounter: Payer: Medicare Other | Admitting: Gastroenterology

## 2019-06-21 VITALS — BP 164/87 | HR 97 | Temp 97.3°F | Resp 17 | Ht 65.0 in | Wt 174.0 lb

## 2019-06-21 DIAGNOSIS — K648 Other hemorrhoids: Secondary | ICD-10-CM

## 2019-06-21 DIAGNOSIS — K5900 Constipation, unspecified: Secondary | ICD-10-CM

## 2019-06-21 DIAGNOSIS — R194 Change in bowel habit: Secondary | ICD-10-CM | POA: Diagnosis not present

## 2019-06-21 MED ORDER — FLEET ENEMA 7-19 GM/118ML RE ENEM
1.0000 | ENEMA | Freq: Once | RECTAL | Status: AC
  Administered 2019-06-21: 1 via RECTAL

## 2019-06-21 MED ORDER — SODIUM CHLORIDE 0.9 % IV SOLN: 500.0000 mL | Freq: Once | INTRAVENOUS | Status: DC

## 2019-06-21 NOTE — Op Note (Signed)
[place] Patient Name: Kim Hicks Procedure Date: 06/21/2019 2:01 PM MRN: [id number] Endoscopist: [name] , MD Age: 54 Referring MD:  Date of Birth: 05-06-1966 Gender: Female Account #: [id number] Procedure:                Colonoscopy Indications:              Constipation, Change in bowel habits Medicines:                Monitored Anesthesia Care Procedure:                Pre-Anesthesia Assessment:                           - Prior to the procedure, a History and Physical                            was performed, and patient medications and                            allergies were reviewed. The patient's tolerance of                            previous anesthesia was also reviewed. The risks                            and benefits of the procedure and the sedation                            options and risks were discussed with the patient.                            All questions were answered, and informed consent                            was obtained. Prior Anticoagulants: The patient has                            taken no previous anticoagulant or antiplatelet                            agents. ASA Grade Assessment: II - A patient with                            mild systemic disease. After reviewing the risks                            and benefits, the patient was deemed in                            satisfactory condition to undergo the procedure.                           After obtaining informed consent, the colonoscope  was passed under direct vision. Throughout the                            procedure, the patient's blood pressure, pulse, and                            oxygen saturations were monitored continuously. The                            Colonoscope was introduced through the anus and                            advanced to the the cecum, identified by                            appendiceal orifice and ileocecal  valve. The                            ileocecal valve, appendiceal orifice, and rectum                            were photographed. The quality of the bowel                            preparation was inadequate. The patient tolerated                            the procedure well. The colonoscopy was somewhat                            difficult due to inadequate bowel prep. Successful                            completion of the procedure was aided by extensive                            lavage and suctioning. Scope In: 2:31:22 PM Scope Out: 3:00:50 PM Scope Withdrawal Time: 0 hours 17 minutes 59 seconds  Total Procedure Duration: 0 hours 29 minutes 28 seconds  Findings:                 The perianal and digital rectal examinations were                            normal.                           Extensive amounts of semi-liquid stool was found in                            the entire colon, interfering with visualization.                            Lavage of the area was performed using greater than  500 mL of tap water, resulting in incomplete                            clearance with fair visualization.                           Internal hemorrhoids were found during                            retroflexion. The hemorrhoids were small and Grade                            I (internal hemorrhoids that do not prolapse).                           The exam was otherwise without abnormality on                            direct and retroflexion views. Complications:            No immediate complications. Estimated blood loss:                            None. Estimated Blood Loss:     Estimated blood loss: none. Impression:               - Preparation of the colon was inadequate.                           - Stool in the entire examined colon.                           - Internal hemorrhoids.                           - The examination was otherwise normal on direct                             and retroflexion views.                           - No specimens collected. Recommendation:           - Repeat colonoscopy in 2 months with an extended                            bowel preparation because the bowel preparation was                            inadequate.                           - Patient has a contact number available for                            emergencies. The signs and symptoms of potential  delayed complications were discussed with the                            patient. Return to normal activities tomorrow.                            Written discharge instructions were provided to the                            patient.                           - Resume previous diet.                           - Continue present medications.                           - Start Linzess 290 mcg po qd tomorrow. If not                            having complete bowel movements every day or every                            other day please call. [name], MD 06/21/2019 3:08:52 PM This report has been signed electronically.

## 2019-06-21 NOTE — Progress Notes (Signed)
Results after enema: cloudy dark yellow no solid

## 2019-06-21 NOTE — Progress Notes (Signed)
PT taken to PACU. Monitors in place. VSS. Report given to RN. 

## 2019-06-21 NOTE — Patient Instructions (Signed)
Discharge instructions given. Handout on Hemorrhoids. Resume previous medications. Scheduled return colonoscopy and pre-visit. YOU HAD AN ENDOSCOPIC PROCEDURE TODAY AT [facility]:   Refer to the procedure report that was given to you for any specific questions about what was found during the examination.  If the procedure report does not answer your questions, please call your gastroenterologist to clarify.  If you requested that your care partner not be given the details of your procedure findings, then the procedure report has been included in a sealed envelope for you to review at your convenience later.  YOU SHOULD EXPECT: Some feelings of bloating in the abdomen. Passage of more gas than usual.  Walking can help get rid of the air that was put into your GI tract during the procedure and reduce the bloating. If you had a lower endoscopy (such as a colonoscopy or flexible sigmoidoscopy) you may notice spotting of blood in your stool or on the toilet paper. If you underwent a bowel prep for your procedure, you may not have a normal bowel movement for a few days.  Please Note:  You might notice some irritation and congestion in your nose or some drainage.  This is from the oxygen used during your procedure.  There is no need for concern and it should clear up in a day or so.  SYMPTOMS TO REPORT IMMEDIATELY:   Following lower endoscopy (colonoscopy or flexible sigmoidoscopy):  Excessive amounts of blood in the stool  Significant tenderness or worsening of abdominal pains  Swelling of the abdomen that is new, acute  Fever of 100F or higher  For urgent or emergent issues, a gastroenterologist can be reached at any hour by calling [phone].   DIET:  We do recommend a small meal at first, but then you may proceed to your regular diet.  Drink plenty of fluids but you should avoid alcoholic beverages for 24 hours.  ACTIVITY:  You should plan to take it easy for the rest of  today and you should NOT DRIVE or use heavy machinery until tomorrow (because of the sedation medicines used during the test).    FOLLOW UP: Our staff will call the number listed on your records 48-72 hours following your procedure to check on you and address any questions or concerns that you may have regarding the information given to you following your procedure. If we do not reach you, we will leave a message.  We will attempt to reach you two times.  During this call, we will ask if you have developed any symptoms of COVID 19. If you develop any symptoms (ie: fever, flu-like symptoms, shortness of breath, cough etc.) before then, please call [phone].  If you test positive for Covid 19 in the 2 weeks post procedure, please call and report this information to [place].    If any biopsies were taken you will be contacted by phone or by letter within the next 1-3 weeks.  Please call us at [phone] if you have not heard about the biopsies in 3 weeks.    SIGNATURES/CONFIDENTIALITY: You and/or your care partner have signed paperwork which will be entered into your electronic medical record.  These signatures attest to the fact that that the information above on your After Visit Summary has been reviewed and is understood.  Full responsibility of the confidentiality of this discharge information lies with you and/or your care-partner.

## 2019-06-22 ENCOUNTER — Other Ambulatory Visit (HOSPITAL_COMMUNITY)
Admission: RE | Admit: 2019-06-22 | Discharge: 2019-06-22 | Disposition: A | Discharge status: Home / Self Care | Payer: Medicare Other | Source: Ambulatory Visit | Attending: Surgery | Admitting: Surgery

## 2019-06-22 ENCOUNTER — Encounter (HOSPITAL_COMMUNITY)
Admission: RE | Admit: 2019-06-22 | Discharge: 2019-06-22 | Disposition: A | Discharge status: Home / Self Care | Payer: Medicare Other | Source: Ambulatory Visit | Attending: Surgery | Admitting: Surgery

## 2019-06-22 ENCOUNTER — Other Ambulatory Visit: Payer: Self-pay

## 2019-06-22 DIAGNOSIS — U071 COVID-19: Secondary | ICD-10-CM | POA: Diagnosis not present

## 2019-06-22 DIAGNOSIS — Z01812 Encounter for preprocedural laboratory examination: Secondary | ICD-10-CM | POA: Diagnosis present

## 2019-06-22 LAB — CBC WITH DIFFERENTIAL/PLATELET
Abs Immature Granulocytes: 0.02 10*3/uL (ref 0.00–0.07)
Basophils Absolute: 0 10*3/uL (ref 0.0–0.1)
Basophils Relative: 0 %
Eosinophils Absolute: 0.1 10*3/uL (ref 0.0–0.5)
Eosinophils Relative: 3 %
HCT: 35.4 % — ABNORMAL LOW (ref 36.0–46.0)
Hemoglobin: 10.8 g/dL — ABNORMAL LOW (ref 12.0–15.0)
Immature Granulocytes: 0 %
Lymphocytes Relative: 19 %
Lymphs Abs: 1 10*3/uL (ref 0.7–4.0)
MCH: 28.3 pg (ref 26.0–34.0)
MCHC: 30.5 g/dL (ref 30.0–36.0)
MCV: 92.7 fL (ref 80.0–100.0)
Monocytes Absolute: 0.3 10*3/uL (ref 0.1–1.0)
Monocytes Relative: 6 %
Neutro Abs: 3.7 10*3/uL (ref 1.7–7.7)
Neutrophils Relative %: 72 %
Platelets: 132 10*3/uL — ABNORMAL LOW (ref 150–400)
RBC: 3.82 MIL/uL — ABNORMAL LOW (ref 3.87–5.11)
RDW: 14.6 % (ref 11.5–15.5)
WBC: 5.1 10*3/uL (ref 4.0–10.5)
nRBC: 0 % (ref 0.0–0.2)

## 2019-06-22 LAB — COMPREHENSIVE METABOLIC PANEL
ALT: 42 U/L (ref 0–44)
AST: 65 U/L — ABNORMAL HIGH (ref 15–41)
Albumin: 3.8 g/dL (ref 3.5–5.0)
Alkaline Phosphatase: 173 U/L — ABNORMAL HIGH (ref 38–126)
Anion gap: 10 (ref 5–15)
BUN: 18 mg/dL (ref 6–20)
CO2: 21 mmol/L — ABNORMAL LOW (ref 22–32)
Calcium: 8.9 mg/dL (ref 8.9–10.3)
Chloride: 107 mmol/L (ref 98–111)
Creatinine, Ser: 0.74 mg/dL (ref 0.44–1.00)
GFR calc Af Amer: >60 mL/min (ref >60)
GFR calc non Af Amer: >60 mL/min (ref >60)
Glucose, Bld: 498 mg/dL — ABNORMAL HIGH (ref 70–99)
Potassium: 4.7 mmol/L (ref 3.5–5.1)
Sodium: 138 mmol/L (ref 135–145)
Total Bilirubin: 0.5 mg/dL (ref 0.3–1.2)
Total Protein: 6.9 g/dL (ref 6.5–8.1)

## 2019-06-22 LAB — SARS CORONAVIRUS 2 (TAT 6-24 HRS): SARS Coronavirus 2: POSITIVE — AB

## 2019-06-22 NOTE — Progress Notes (Signed)
Pt covid test result came back positive this evening.  Sent Dr Windle Guard inbox message in epic.  And called [facility] and spoke with answering service, gave message about pt covid postive and unable to do surgery @[facility]  , gave pt name, dob, surgeon, and surgery date 06-23-2019 @ 0830.  Call back number given [phone] front desk @[facility] .

## 2019-06-23 ENCOUNTER — Ambulatory Visit (HOSPITAL_BASED_OUTPATIENT_CLINIC_OR_DEPARTMENT_OTHER): Admission: RE | Admit: 2019-06-23 | Payer: Medicare Other | Source: Home / Self Care | Admitting: Surgery

## 2019-06-23 ENCOUNTER — Encounter: Payer: Self-pay | Admitting: Surgery

## 2019-06-23 ENCOUNTER — Telehealth: Payer: Self-pay

## 2019-06-23 ENCOUNTER — Telehealth: Payer: Self-pay | Admitting: Nurse Practitioner

## 2019-06-23 HISTORY — DX: Family history of other specified conditions: Z84.89

## 2019-06-23 SURGERY — CHOLECYSTECTOMY, ROBOT-ASSISTED, LAPAROSCOPIC
Anesthesia: General

## 2019-06-23 NOTE — Telephone Encounter (Signed)
First attempt follow up call to pt, lm on  vm

## 2019-06-23 NOTE — Progress Notes (Signed)
Patient notified of positive covid test and need to postpone surgery. She is asymptomatic. Advised to quarantine for at least 10 days, and to contact PCP if symptoms develop. Will plan to reschedule her surgery for a month from now.

## 2019-06-23 NOTE — Telephone Encounter (Signed)
Called to Discuss with patient about Covid symptoms and the use of bamlanivimab, a monoclonal antibody infusion for those with mild to moderate Covid symptoms and at a high risk of hospitalization.     Pt is qualified for this infusion at the [facility] infusion center due to co-morbid conditions and/or a member of an at-risk group.     Unable to reach pt

## 2019-06-23 NOTE — Telephone Encounter (Signed)
  Follow up Call-  Call back number 06/21/2019  Post procedure Call Back phone  # [phone]  Permission to leave phone message Yes  Some recent data might be hidden     Patient questions:  Do you have a fever, pain , or abdominal swelling? No. Pain Score  0 *  Have you tolerated food without any problems? Yes.    Have you been able to return to your normal activities? Yes.    Do you have any questions about your discharge instructions: Diet   No. Medications  No. Follow up visit  No.  Do you have questions or concerns about your Care? No.  Actions: * If pain score is 4 or above: No action needed, pain <4. 1. Have you developed a fever since your procedure? no  2.   Have you had an respiratory symptoms (SOB or cough) since your procedure? no  3.   Have you tested positive for COVID 19 since your procedure no  4.   Have you had any family members/close contacts diagnosed with the COVID 19 since your procedure?  no   If yes to any of these questions please route to Joylene John, RN and Alphonsa Gin, Therapist, sports.

## 2019-06-24 ENCOUNTER — Telehealth (HOSPITAL_COMMUNITY): Payer: Self-pay | Admitting: Physician Assistant

## 2019-06-24 NOTE — Telephone Encounter (Signed)
Called to discuss with Kim Hicks about Covid symptoms and the use of bamlanivimab, a monoclonal antibody infusion for those with mild to moderate Covid symptoms and at a high risk of hospitalization.     Pt is qualified for this infusion at the [facility] infusion center due to co-morbid conditions and/or a member of an at-risk group, however wants to think about it. Symptoms tier reviewed as well as criteria for ending isolation.  Symptoms reviewed that would warrant ED/Hospital evaluation. Preventative practices reviewed. Patient verbalized understanding. Patient advised to call back if he decides that he does want to get infusion. Callback number to the hotline given.     Leanor Kail, [place]

## 2019-06-28 ENCOUNTER — Ambulatory Visit: Payer: Medicare Other | Admitting: General Practice

## 2019-07-08 MED ORDER — DEXTROSE 5 % IV SOLN: 2.0000 g | INTRAVENOUS | Status: DC

## 2019-07-08 MED ORDER — CHLORHEXIDINE GLUCONATE 4 % EX LIQD: 60.0000 mL | Freq: Once | CUTANEOUS | Status: DC

## 2019-07-08 MED ORDER — BUPIVACAINE LIPOSOME 1.3 % IJ SUSP: 20.0000 mL | Freq: Once | INTRAMUSCULAR | Status: DC

## 2019-07-08 MED ORDER — INDOCYANINE GREEN 25 MG IV SOLR: 2.5000 mg | Freq: Once | INTRAVENOUS | Status: DC

## 2019-07-08 MED ORDER — GABAPENTIN 100 MG PO CAPS: 300.0000 mg | ORAL_CAPSULE | ORAL | Status: DC

## 2019-07-08 MED ORDER — ACETAMINOPHEN 500 MG PO TABS: 1000.0000 mg | ORAL_TABLET | ORAL | Status: DC

## 2019-07-28 NOTE — Patient Instructions (Addendum)
DUE TO COVID-19 ONLY ONE VISITOR IS ALLOWED TO COME WITH YOU AND STAY IN THE WAITING ROOM ONLY DURING PRE OP AND PROCEDURE DAY OF SURGERY. THE 1 VISITOR MAY VISIT WITH YOU AFTER SURGERY IN YOUR PRIVATE ROOM DURING VISITING HOURS ONLY!               ARYELLA BESECKER  07/28/2019   Your procedure is scheduled on: 08-04-19   Report to [facility] Main  Entrance    Report to Admitting at 7:00 AM     Call this number if you have problems the morning of surgery [phone]    Remember: Do not eat food or drink liquids :After Midnight.     Take these medicines the morning of surgery with A SIP OF WATER: Diltiazem (Tiadylt ER), Seroquel, Zofran, Meclizine, Zocor and  Topomax  BRUSH YOUR TEETH MORNING OF SURGERY AND RINSE YOUR MOUTH OUT, NO CHEWING GUM CANDY OR MINTS.  DO NOT TAKE ANY DIABETIC MEDICATIONS DAY OF YOUR SURGERY                               You may not have any metal on your body including hair pins and              piercings     Do not wear jewelry, make-up, lotions, powders or perfumes, deodorant              Do not wear nail polish on your fingernails.  Do not shave  48 hours prior to surgery.             Do not bring valuables to the hospital. [place].  Contacts, dentures or bridgework may not be worn into surgery.       Patients discharged the day of surgery will not be allowed to drive home. IF YOU ARE HAVING SURGERY AND GOING HOME THE SAME DAY, YOU MUST HAVE AN ADULT TO DRIVE YOU HOME AND BE WITH YOU FOR 24 HOURS. YOU MAY GO HOME BY TAXI OR UBER OR ORTHERWISE, BUT AN ADULT MUST ACCOMPANY YOU HOME AND STAY WITH YOU FOR 24 HOURS.  Name and phone number of your driver: Lanett Lasorsa [phone]  Special Instructions: N/A              Please read over the following fact sheets you were given: _____________________________________________________________________             How to Manage Your Diabetes Before and  After Surgery  Why is it important to control my blood sugar before and after surgery? . Improving blood sugar levels before and after surgery helps healing and can limit problems. . A way of improving blood sugar control is eating a healthy diet by: o  Eating less sugar and carbohydrates o  Increasing activity/exercise o  Talking with your doctor about reaching your blood sugar goals . High blood sugars (greater than 180 mg/dL) can raise your risk of infections and slow your recovery, so you will need to focus on controlling your diabetes during the weeks before surgery. . Make sure that the doctor who takes care of your diabetes knows about your planned surgery including the date and location.  How do I manage my blood sugar before surgery? . Check your blood sugar at least 4 times a day,  starting 2 days before surgery, to make sure that the level is not too high or low. o Check your blood sugar the morning of your surgery when you wake up and every 2 hours until you get to the Short Stay unit. . If your blood sugar is less than 70 mg/dL, you will need to treat for low blood sugar: o Do not take insulin. o Treat a low blood sugar (less than 70 mg/dL) with  cup of clear juice (cranberry or apple), 4 glucose tablets, OR glucose gel. o Recheck blood sugar in 15 minutes after treatment (to make sure it is greater than 70 mg/dL). If your blood sugar is not greater than 70 mg/dL on recheck, call [phone] for further instructions. . Report your blood sugar to the short stay nurse when you get to Short Stay.  . If you are admitted to the hospital after surgery: o Your blood sugar will be checked by the staff and you will probably be given insulin after surgery (instead of oral diabetes medicines) to make sure you have good blood sugar levels. o The goal for blood sugar control after surgery is 80-180 mg/dL.   WHAT DO I DO ABOUT MY DIABETES MEDICATION?  [name] Do not take oral diabetes medicines  (pills) the morning of surgery.  . THE DAY BEFORE SURGERY, Do not take your Dynjardy          . The day of surgery, do not take other diabetes injectables, including Byetta (exenatide), Bydureon (exenatide ER), Victoza (liraglutide), or Trulicity (dulaglutide).     [facility] - Preparing for Surgery Before surgery, you can play an important role.  Because skin is not sterile, your skin needs to be as free of germs as possible.  You can reduce the number of germs on your skin by washing with CHG (chlorahexidine gluconate) soap before surgery.  CHG is an antiseptic cleaner which kills germs and bonds with the skin to continue killing germs even after washing. Please DO NOT use if you have an allergy to CHG or antibacterial soaps.  If your skin becomes reddened/irritated stop using the CHG and inform your nurse when you arrive at Short Stay. Do not shave (including legs and underarms) for at least 48 hours prior to the first CHG shower.  You may shave your face/neck. Please follow these instructions carefully:  1.  Shower with CHG Soap the night before surgery and the  morning of Surgery.  2.  If you choose to wash your hair, wash your hair first as usual with your  normal  shampoo.  3.  After you shampoo, rinse your hair and body thoroughly to remove the  shampoo.                           4.  Use CHG as you would any other liquid soap.  You can apply chg directly  to the skin and wash                       Gently with a scrungie or clean washcloth.  5.  Apply the CHG Soap to your body ONLY FROM THE NECK DOWN.   Do not use on face/ open                           Wound or open sores. Avoid contact with eyes, ears mouth and genitals (private  parts).                       Wash face,  Genitals (private parts) with your normal soap.             6.  Wash thoroughly, paying special attention to the area where your surgery  will be performed.  7.  Thoroughly rinse your body with warm water from the  neck down.  8.  DO NOT shower/wash with your normal soap after using and rinsing off  the CHG Soap.                9.  Pat yourself dry with a clean towel.            10.  Wear clean pajamas.            11.  Place clean sheets on your bed the night of your first shower and do not  sleep with pets. Day of Surgery : Do not apply any lotions/deodorants the morning of surgery.  Please wear clean clothes to the hospital/surgery center.  FAILURE TO FOLLOW THESE INSTRUCTIONS MAY RESULT IN THE CANCELLATION OF YOUR SURGERY PATIENT SIGNATURE_________________________________  NURSE SIGNATURE__________________________________  ________________________________________________________________________

## 2019-07-28 NOTE — Progress Notes (Addendum)
PCP - [name], FNP  Cardiologist -   Chest x-ray -   EKG - 12-13-18  HGA1C 9.5  Stress Test -  ECHO -  Cardiac Cath -   Sleep Study -  CPAP -   Fasting Blood Sugar -  Checks Blood Sugar _____ times a day  Blood Thinner Instructions: Aspirin Instructions: Last Dose:  Anesthesia review:   Patient denies shortness of breath, fever, cough and chest pain at PAT appointment   Patient verbalized understanding of instructions that were given to them at the PAT appointment. Patient was also instructed that they will need to review over the PAT instructions again at home before surgery.

## 2019-08-01 ENCOUNTER — Inpatient Hospital Stay (HOSPITAL_COMMUNITY)
Admission: RE | Admit: 2019-08-01 | Discharge: 2019-08-01 | Disposition: A | Discharge status: Home / Self Care | Payer: Medicare Other | Source: Ambulatory Visit

## 2019-08-01 ENCOUNTER — Ambulatory Visit (HOSPITAL_COMMUNITY)
Admission: RE | Admit: 2019-08-01 | Discharge: 2019-08-01 | Disposition: A | Discharge status: Home / Self Care | Payer: Medicare Other | Source: Ambulatory Visit | Attending: Surgery | Admitting: Surgery

## 2019-08-01 ENCOUNTER — Encounter (HOSPITAL_COMMUNITY)
Admission: RE | Admit: 2019-08-01 | Discharge: 2019-08-01 | Disposition: A | Discharge status: Home / Self Care | Payer: Medicare Other | Source: Ambulatory Visit | Attending: Surgery | Admitting: Surgery

## 2019-08-01 ENCOUNTER — Other Ambulatory Visit (HOSPITAL_COMMUNITY): Payer: Medicare Other

## 2019-08-01 ENCOUNTER — Encounter (HOSPITAL_COMMUNITY): Payer: Self-pay

## 2019-08-01 ENCOUNTER — Other Ambulatory Visit: Payer: Self-pay

## 2019-08-01 DIAGNOSIS — Z01810 Encounter for preprocedural cardiovascular examination: Secondary | ICD-10-CM

## 2019-08-01 DIAGNOSIS — Z01818 Encounter for other preprocedural examination: Secondary | ICD-10-CM | POA: Insufficient documentation

## 2019-08-01 LAB — CBC WITH DIFFERENTIAL/PLATELET
Abs Immature Granulocytes: 0.02 10*3/uL (ref 0.00–0.07)
Basophils Absolute: 0 10*3/uL (ref 0.0–0.1)
Basophils Relative: 0 %
Eosinophils Absolute: 0.2 10*3/uL (ref 0.0–0.5)
Eosinophils Relative: 4 %
HCT: 39.9 % (ref 36.0–46.0)
Hemoglobin: 11.6 g/dL — ABNORMAL LOW (ref 12.0–15.0)
Immature Granulocytes: 0 %
Lymphocytes Relative: 19 %
Lymphs Abs: 1.1 10*3/uL (ref 0.7–4.0)
MCH: 26 pg (ref 26.0–34.0)
MCHC: 29.1 g/dL — ABNORMAL LOW (ref 30.0–36.0)
MCV: 89.5 fL (ref 80.0–100.0)
Monocytes Absolute: 0.3 10*3/uL (ref 0.1–1.0)
Monocytes Relative: 6 %
Neutro Abs: 3.9 10*3/uL (ref 1.7–7.7)
Neutrophils Relative %: 71 %
Platelets: 154 10*3/uL (ref 150–400)
RBC: 4.46 MIL/uL (ref 3.87–5.11)
RDW: 13.9 % (ref 11.5–15.5)
WBC: 5.5 10*3/uL (ref 4.0–10.5)
nRBC: 0 % (ref 0.0–0.2)

## 2019-08-01 LAB — COMPREHENSIVE METABOLIC PANEL
ALT: 48 U/L — ABNORMAL HIGH (ref 0–44)
AST: 78 U/L — ABNORMAL HIGH (ref 15–41)
Albumin: 3.9 g/dL (ref 3.5–5.0)
Alkaline Phosphatase: 143 U/L — ABNORMAL HIGH (ref 38–126)
Anion gap: 9 (ref 5–15)
BUN: 19 mg/dL (ref 6–20)
CO2: 23 mmol/L (ref 22–32)
Calcium: 9.1 mg/dL (ref 8.9–10.3)
Chloride: 107 mmol/L (ref 98–111)
Creatinine, Ser: 0.75 mg/dL (ref 0.44–1.00)
GFR calc Af Amer: >60 mL/min (ref >60)
GFR calc non Af Amer: >60 mL/min (ref >60)
Glucose, Bld: 300 mg/dL — ABNORMAL HIGH (ref 70–99)
Potassium: 4.3 mmol/L (ref 3.5–5.1)
Sodium: 139 mmol/L (ref 135–145)
Total Bilirubin: 0.6 mg/dL (ref 0.3–1.2)
Total Protein: 7.3 g/dL (ref 6.5–8.1)

## 2019-08-01 LAB — GLUCOSE, CAPILLARY: Glucose-Capillary: 267 mg/dL — ABNORMAL HIGH (ref 70–99)

## 2019-08-01 LAB — HEMOGLOBIN A1C
Hgb A1c MFr Bld: 9.5 % — ABNORMAL HIGH (ref 4.8–5.6)
Mean Plasma Glucose: 225.95 mg/dL

## 2019-08-01 NOTE — Progress Notes (Signed)
Pt not tested for covid today due to pt testing + for covid on 06/22/19. Based on the guidelines the pt is in the 90 day window to not retest. Therefore, the pt can still have the scheduled procedure. These are the guidelines as follows:  Guidance: Patient previously tested + COVID; now past 90 day window seeking elective surgery (asymptomatic)  Retest patient If negative, proceed with surgery If positive, postpone surgery for 10 days from positive test Patient to quarantine for the (10 days) Do not retest again prior to surgery (even if scheduled a couple of weeks out) Use standard precautions for surgery

## 2019-08-01 NOTE — Progress Notes (Signed)
HGA1C routed to Dr. Kae Heller for review, and forwarded to Konrad Felix, PA for review

## 2019-08-03 MED ORDER — BUPIVACAINE LIPOSOME 1.3 % IJ SUSP
20.0000 mL | Freq: Once | INTRAMUSCULAR | Status: DC
  Filled 2019-08-03: qty 20

## 2019-08-04 ENCOUNTER — Inpatient Hospital Stay (HOSPITAL_COMMUNITY)
Admission: RE | Admit: 2019-08-04 | Discharge: 2019-08-11 | DRG: 418 | Disposition: A | Discharge status: Home Health | Payer: Medicare Other | Attending: Surgery | Admitting: Surgery

## 2019-08-04 ENCOUNTER — Ambulatory Visit (HOSPITAL_COMMUNITY): Payer: Medicare Other | Admitting: Physician Assistant

## 2019-08-04 ENCOUNTER — Other Ambulatory Visit: Payer: Self-pay

## 2019-08-04 ENCOUNTER — Encounter (HOSPITAL_COMMUNITY): Payer: Self-pay | Admitting: Surgery

## 2019-08-04 ENCOUNTER — Ambulatory Visit (HOSPITAL_COMMUNITY): Payer: Medicare Other | Admitting: Certified Registered Nurse Anesthetist

## 2019-08-04 ENCOUNTER — Encounter (HOSPITAL_COMMUNITY)
Admission: RE | Disposition: A | Discharge status: Home Health | Payer: Self-pay | Source: Home / Self Care | Attending: Surgery

## 2019-08-04 DIAGNOSIS — Z79891 Long term (current) use of opiate analgesic: Secondary | ICD-10-CM

## 2019-08-04 DIAGNOSIS — F329 Major depressive disorder, single episode, unspecified: Secondary | ICD-10-CM | POA: Diagnosis present

## 2019-08-04 DIAGNOSIS — Z79899 Other long term (current) drug therapy: Secondary | ICD-10-CM

## 2019-08-04 DIAGNOSIS — Z888 Allergy status to other drugs, medicaments and biological substances status: Secondary | ICD-10-CM

## 2019-08-04 DIAGNOSIS — K581 Irritable bowel syndrome with constipation: Secondary | ICD-10-CM | POA: Diagnosis present

## 2019-08-04 DIAGNOSIS — E785 Hyperlipidemia, unspecified: Secondary | ICD-10-CM | POA: Diagnosis present

## 2019-08-04 DIAGNOSIS — K7460 Unspecified cirrhosis of liver: Secondary | ICD-10-CM | POA: Diagnosis present

## 2019-08-04 DIAGNOSIS — W19XXXA Unspecified fall, initial encounter: Secondary | ICD-10-CM

## 2019-08-04 DIAGNOSIS — K7469 Other cirrhosis of liver: Secondary | ICD-10-CM | POA: Diagnosis present

## 2019-08-04 DIAGNOSIS — K839 Disease of biliary tract, unspecified: Secondary | ICD-10-CM

## 2019-08-04 DIAGNOSIS — E669 Obesity, unspecified: Secondary | ICD-10-CM | POA: Diagnosis present

## 2019-08-04 DIAGNOSIS — K8010 Calculus of gallbladder with chronic cholecystitis without obstruction: Principal | ICD-10-CM | POA: Diagnosis present

## 2019-08-04 DIAGNOSIS — K8050 Calculus of bile duct without cholangitis or cholecystitis without obstruction: Secondary | ICD-10-CM | POA: Diagnosis present

## 2019-08-04 DIAGNOSIS — K766 Portal hypertension: Secondary | ICD-10-CM | POA: Diagnosis present

## 2019-08-04 DIAGNOSIS — Z8616 Personal history of COVID-19: Secondary | ICD-10-CM

## 2019-08-04 DIAGNOSIS — Z23 Encounter for immunization: Secondary | ICD-10-CM

## 2019-08-04 DIAGNOSIS — K9189 Other postprocedural complications and disorders of digestive system: Secondary | ICD-10-CM | POA: Diagnosis not present

## 2019-08-04 DIAGNOSIS — R932 Abnormal findings on diagnostic imaging of liver and biliary tract: Secondary | ICD-10-CM | POA: Diagnosis present

## 2019-08-04 DIAGNOSIS — I10 Essential (primary) hypertension: Secondary | ICD-10-CM | POA: Diagnosis present

## 2019-08-04 DIAGNOSIS — E1140 Type 2 diabetes mellitus with diabetic neuropathy, unspecified: Secondary | ICD-10-CM | POA: Diagnosis present

## 2019-08-04 DIAGNOSIS — Z90710 Acquired absence of both cervix and uterus: Secondary | ICD-10-CM

## 2019-08-04 DIAGNOSIS — Z885 Allergy status to narcotic agent status: Secondary | ICD-10-CM

## 2019-08-04 DIAGNOSIS — Z7984 Long term (current) use of oral hypoglycemic drugs: Secondary | ICD-10-CM

## 2019-08-04 DIAGNOSIS — Z6828 Body mass index (BMI) 28.0-28.9, adult: Secondary | ICD-10-CM

## 2019-08-04 HISTORY — DX: Unspecified cirrhosis of liver: K74.60

## 2019-08-04 HISTORY — PX: CHOLECYSTECTOMY: SHX55

## 2019-08-04 LAB — GLUCOSE, CAPILLARY
Glucose-Capillary: 174 mg/dL — ABNORMAL HIGH (ref 70–99)
Glucose-Capillary: 268 mg/dL — ABNORMAL HIGH (ref 70–99)
Glucose-Capillary: 236 mg/dL — ABNORMAL HIGH (ref 70–99)
Glucose-Capillary: 216 mg/dL — ABNORMAL HIGH (ref 70–99)
Glucose-Capillary: 129 mg/dL — ABNORMAL HIGH (ref 70–99)

## 2019-08-04 SURGERY — LAPAROSCOPIC CHOLECYSTECTOMY
Anesthesia: General

## 2019-08-04 MED ORDER — ONDANSETRON HCL 4 MG/2ML IJ SOLN
INTRAMUSCULAR | Status: DC | PRN
  Administered 2019-08-04: 4 mg via INTRAVENOUS

## 2019-08-04 MED ORDER — TIZANIDINE HCL 4 MG PO TABS
4.0000 mg | ORAL_TABLET | Freq: Three times a day (TID) | ORAL | Status: DC
  Administered 2019-08-04 – 2019-08-11 (×19): 4 mg via ORAL
  Filled 2019-08-04 (×21): qty 1

## 2019-08-04 MED ORDER — DILTIAZEM HCL ER COATED BEADS 120 MG PO CP24
120.0000 mg | ORAL_CAPSULE | Freq: Every day | ORAL | Status: DC
  Administered 2019-08-04 – 2019-08-11 (×7): 120 mg via ORAL
  Filled 2019-08-04 (×12): qty 1

## 2019-08-04 MED ORDER — INSULIN ASPART 100 UNIT/ML ~~LOC~~ SOLN
SUBCUTANEOUS | Status: AC
  Filled 2019-08-04: qty 1

## 2019-08-04 MED ORDER — OXYCODONE HCL 5 MG/5ML PO SOLN: 5.0000 mg | Freq: Once | ORAL | Status: DC | PRN

## 2019-08-04 MED ORDER — HYDROMORPHONE HCL 1 MG/ML IJ SOLN
0.5000 mg | INTRAMUSCULAR | Status: DC | PRN
  Administered 2019-08-04 (×2): 0.5 mg via INTRAVENOUS
  Filled 2019-08-04 (×2): qty 0.5

## 2019-08-04 MED ORDER — QUETIAPINE FUMARATE 300 MG PO TABS
800.0000 mg | ORAL_TABLET | Freq: Every day | ORAL | Status: DC
  Administered 2019-08-04 – 2019-08-10 (×7): 800 mg via ORAL
  Filled 2019-08-04 (×7): qty 1

## 2019-08-04 MED ORDER — QUETIAPINE FUMARATE 50 MG PO TABS
100.0000 mg | ORAL_TABLET | Freq: Every day | ORAL | Status: DC
  Administered 2019-08-05 – 2019-08-11 (×7): 100 mg via ORAL
  Filled 2019-08-04 (×8): qty 2

## 2019-08-04 MED ORDER — 0.9 % SODIUM CHLORIDE (POUR BTL) OPTIME
TOPICAL | Status: DC | PRN
  Administered 2019-08-04: 1000 mL

## 2019-08-04 MED ORDER — PROMETHAZINE HCL 25 MG/ML IJ SOLN
12.5000 mg | Freq: Four times a day (QID) | INTRAMUSCULAR | Status: DC | PRN
  Administered 2019-08-04: 12.5 mg via INTRAVENOUS
  Filled 2019-08-04: qty 1

## 2019-08-04 MED ORDER — SODIUM CHLORIDE 0.9 % IV SOLN
INTRAVENOUS | Status: DC | PRN
  Administered 2019-08-04: 250 mL via INTRAVENOUS
  Administered 2019-08-07: 500 mL via INTRAVENOUS

## 2019-08-04 MED ORDER — FENTANYL CITRATE (PF) 250 MCG/5ML IJ SOLN
INTRAMUSCULAR | Status: AC
  Filled 2019-08-04: qty 5

## 2019-08-04 MED ORDER — OXYCODONE HCL 5 MG PO TABS: 5.0000 mg | ORAL_TABLET | Freq: Three times a day (TID) | ORAL | 0 refills | Status: DC | PRN

## 2019-08-04 MED ORDER — ONDANSETRON HCL 4 MG/2ML IJ SOLN
INTRAMUSCULAR | Status: AC
  Filled 2019-08-04: qty 2

## 2019-08-04 MED ORDER — PROPOFOL 10 MG/ML IV BOLUS
INTRAVENOUS | Status: DC | PRN
  Administered 2019-08-04: 150 mg via INTRAVENOUS

## 2019-08-04 MED ORDER — ONDANSETRON 4 MG PO TBDP
4.0000 mg | ORAL_TABLET | Freq: Four times a day (QID) | ORAL | Status: DC | PRN
  Administered 2019-08-09: 4 mg via ORAL
  Filled 2019-08-04: qty 1

## 2019-08-04 MED ORDER — ZIPRASIDONE HCL 20 MG PO CAPS
20.0000 mg | ORAL_CAPSULE | Freq: Every day | ORAL | Status: DC
  Administered 2019-08-04 – 2019-08-10 (×7): 20 mg via ORAL
  Filled 2019-08-04 (×7): qty 1

## 2019-08-04 MED ORDER — INSULIN ASPART 100 UNIT/ML ~~LOC~~ SOLN
6.0000 [IU] | Freq: Once | SUBCUTANEOUS | Status: AC
  Administered 2019-08-04: 6 [IU] via SUBCUTANEOUS

## 2019-08-04 MED ORDER — VORTIOXETINE HBR 5 MG PO TABS
20.0000 mg | ORAL_TABLET | Freq: Every day | ORAL | Status: DC
  Administered 2019-08-04 – 2019-08-10 (×7): 20 mg via ORAL
  Filled 2019-08-04 (×7): qty 4

## 2019-08-04 MED ORDER — SUGAMMADEX SODIUM 200 MG/2ML IV SOLN
INTRAVENOUS | Status: DC | PRN
  Administered 2019-08-04: 150 mg via INTRAVENOUS

## 2019-08-04 MED ORDER — BUPIVACAINE LIPOSOME 1.3 % IJ SUSP
INTRAMUSCULAR | Status: DC | PRN
  Administered 2019-08-04: 18 mL

## 2019-08-04 MED ORDER — FUROSEMIDE 20 MG PO TABS: 20.0000 mg | ORAL_TABLET | Freq: Every day | ORAL | Status: DC | PRN

## 2019-08-04 MED ORDER — ROCURONIUM BROMIDE 10 MG/ML (PF) SYRINGE
PREFILLED_SYRINGE | INTRAVENOUS | Status: AC
  Filled 2019-08-04: qty 10

## 2019-08-04 MED ORDER — LACTATED RINGERS IR SOLN
Status: DC | PRN
  Administered 2019-08-04: 1000 mL

## 2019-08-04 MED ORDER — TOPIRAMATE 100 MG PO TABS: 100.0000 mg | ORAL_TABLET | Freq: Every day | ORAL | Status: DC | PRN

## 2019-08-04 MED ORDER — GABAPENTIN 400 MG PO CAPS
1200.0000 mg | ORAL_CAPSULE | Freq: Every day | ORAL | Status: DC
  Administered 2019-08-04 – 2019-08-10 (×7): 1200 mg via ORAL
  Filled 2019-08-04 (×7): qty 3

## 2019-08-04 MED ORDER — ONDANSETRON HCL 4 MG/2ML IJ SOLN
4.0000 mg | Freq: Four times a day (QID) | INTRAMUSCULAR | Status: DC | PRN
  Administered 2019-08-04 – 2019-08-09 (×2): 4 mg via INTRAVENOUS
  Filled 2019-08-04 (×2): qty 2

## 2019-08-04 MED ORDER — PROMETHAZINE HCL 25 MG/ML IJ SOLN: 6.2500 mg | INTRAMUSCULAR | Status: DC | PRN

## 2019-08-04 MED ORDER — OXYCODONE HCL 5 MG PO TABS: 5.0000 mg | ORAL_TABLET | Freq: Once | ORAL | Status: DC | PRN

## 2019-08-04 MED ORDER — PROPOFOL 10 MG/ML IV BOLUS
INTRAVENOUS | Status: AC
  Filled 2019-08-04: qty 20

## 2019-08-04 MED ORDER — BUPIVACAINE-EPINEPHRINE (PF) 0.25% -1:200000 IJ SOLN
INTRAMUSCULAR | Status: AC
  Filled 2019-08-04: qty 30

## 2019-08-04 MED ORDER — CEFAZOLIN SODIUM-DEXTROSE 2-4 GM/100ML-% IV SOLN
2.0000 g | INTRAVENOUS | Status: AC
  Administered 2019-08-04: 2 g via INTRAVENOUS
  Filled 2019-08-04: qty 100

## 2019-08-04 MED ORDER — MIDAZOLAM HCL 5 MG/5ML IJ SOLN
INTRAMUSCULAR | Status: DC | PRN
  Administered 2019-08-04 (×2): 1 mg via INTRAVENOUS

## 2019-08-04 MED ORDER — MECLIZINE HCL 25 MG PO TABS: 50.0000 mg | ORAL_TABLET | Freq: Four times a day (QID) | ORAL | Status: DC | PRN

## 2019-08-04 MED ORDER — TRAMADOL HCL 50 MG PO TABS: 50.0000 mg | ORAL_TABLET | Freq: Every day | ORAL | Status: DC | PRN

## 2019-08-04 MED ORDER — LACTATED RINGERS IV SOLN: INTRAVENOUS | Status: DC

## 2019-08-04 MED ORDER — CHLORHEXIDINE GLUCONATE 4 % EX LIQD: 60.0000 mL | Freq: Once | CUTANEOUS | Status: DC

## 2019-08-04 MED ORDER — DOCUSATE SODIUM 100 MG PO CAPS
100.0000 mg | ORAL_CAPSULE | Freq: Two times a day (BID) | ORAL | Status: DC
  Administered 2019-08-04 – 2019-08-07 (×6): 100 mg via ORAL
  Filled 2019-08-04 (×6): qty 1

## 2019-08-04 MED ORDER — OXYCODONE HCL 5 MG PO TABS
5.0000 mg | ORAL_TABLET | ORAL | Status: DC | PRN
  Administered 2019-08-04: 10 mg via ORAL
  Administered 2019-08-04: 5 mg via ORAL
  Administered 2019-08-05 (×2): 10 mg via ORAL
  Filled 2019-08-04: qty 1
  Filled 2019-08-04 (×3): qty 2

## 2019-08-04 MED ORDER — FENTANYL CITRATE (PF) 100 MCG/2ML IJ SOLN
INTRAMUSCULAR | Status: AC
  Filled 2019-08-04: qty 2

## 2019-08-04 MED ORDER — ROCURONIUM BROMIDE 50 MG/5ML IV SOSY
PREFILLED_SYRINGE | INTRAVENOUS | Status: DC | PRN
  Administered 2019-08-04 (×2): 10 mg via INTRAVENOUS
  Administered 2019-08-04: 50 mg via INTRAVENOUS

## 2019-08-04 MED ORDER — PRAZOSIN HCL 1 MG PO CAPS
4.0000 mg | ORAL_CAPSULE | Freq: Every day | ORAL | Status: DC
  Administered 2019-08-04 – 2019-08-10 (×7): 4 mg via ORAL
  Filled 2019-08-04 (×8): qty 4

## 2019-08-04 MED ORDER — DEXAMETHASONE SODIUM PHOSPHATE 10 MG/ML IJ SOLN
INTRAMUSCULAR | Status: DC | PRN
  Administered 2019-08-04: 4 mg via INTRAVENOUS

## 2019-08-04 MED ORDER — INSULIN ASPART 100 UNIT/ML ~~LOC~~ SOLN
0.0000 [IU] | Freq: Every day | SUBCUTANEOUS | Status: DC
  Administered 2019-08-05: 4 [IU] via SUBCUTANEOUS
  Administered 2019-08-06 – 2019-08-08 (×3): 2 [IU] via SUBCUTANEOUS
  Administered 2019-08-09: 3 [IU] via SUBCUTANEOUS
  Administered 2019-08-10: 5 [IU] via SUBCUTANEOUS

## 2019-08-04 MED ORDER — PNEUMOCOCCAL VAC POLYVALENT 25 MCG/0.5ML IJ INJ
0.5000 mL | INJECTION | INTRAMUSCULAR | Status: AC
  Administered 2019-08-05: 0.5 mL via INTRAMUSCULAR
  Filled 2019-08-04: qty 0.5

## 2019-08-04 MED ORDER — BUPIVACAINE-EPINEPHRINE 0.25% -1:200000 IJ SOLN
INTRAMUSCULAR | Status: DC | PRN
  Administered 2019-08-04: 27 mL

## 2019-08-04 MED ORDER — PANTOPRAZOLE SODIUM 40 MG IV SOLR
40.0000 mg | Freq: Every day | INTRAVENOUS | Status: DC
  Administered 2019-08-04 – 2019-08-09 (×6): 40 mg via INTRAVENOUS
  Filled 2019-08-04 (×6): qty 40

## 2019-08-04 MED ORDER — BISACODYL 10 MG RE SUPP: 10.0000 mg | Freq: Every day | RECTAL | Status: DC | PRN

## 2019-08-04 MED ORDER — DEXAMETHASONE SODIUM PHOSPHATE 10 MG/ML IJ SOLN
INTRAMUSCULAR | Status: AC
  Filled 2019-08-04: qty 1

## 2019-08-04 MED ORDER — MIDAZOLAM HCL 2 MG/2ML IJ SOLN
INTRAMUSCULAR | Status: AC
  Filled 2019-08-04: qty 2

## 2019-08-04 MED ORDER — METOPROLOL TARTRATE 5 MG/5ML IV SOLN: 5.0000 mg | Freq: Four times a day (QID) | INTRAVENOUS | Status: DC | PRN

## 2019-08-04 MED ORDER — ONDANSETRON HCL 4 MG PO TABS: 4.0000 mg | ORAL_TABLET | Freq: Every day | ORAL | Status: DC | PRN

## 2019-08-04 MED ORDER — BREXPIPRAZOLE 2 MG PO TABS
2.0000 mg | ORAL_TABLET | Freq: Every day | ORAL | Status: DC
  Administered 2019-08-04 – 2019-08-10 (×7): 2 mg via ORAL
  Filled 2019-08-04 (×7): qty 1

## 2019-08-04 MED ORDER — FENTANYL CITRATE (PF) 100 MCG/2ML IJ SOLN
INTRAMUSCULAR | Status: DC | PRN
  Administered 2019-08-04 (×4): 50 ug via INTRAVENOUS

## 2019-08-04 MED ORDER — FENTANYL CITRATE (PF) 100 MCG/2ML IJ SOLN
25.0000 ug | INTRAMUSCULAR | Status: DC | PRN
  Administered 2019-08-04: 50 ug via INTRAVENOUS

## 2019-08-04 MED ORDER — LIDOCAINE 2% (20 MG/ML) 5 ML SYRINGE
INTRAMUSCULAR | Status: AC
  Filled 2019-08-04: qty 5

## 2019-08-04 MED ORDER — HYDRALAZINE HCL 20 MG/ML IJ SOLN: 10.0000 mg | INTRAMUSCULAR | Status: DC | PRN

## 2019-08-04 MED ORDER — ACETAMINOPHEN 500 MG PO TABS
1000.0000 mg | ORAL_TABLET | ORAL | Status: AC
  Administered 2019-08-04: 1000 mg via ORAL
  Filled 2019-08-04: qty 2

## 2019-08-04 MED ORDER — LIDOCAINE 2% (20 MG/ML) 5 ML SYRINGE
INTRAMUSCULAR | Status: DC | PRN
  Administered 2019-08-04: 100 mg via INTRAVENOUS

## 2019-08-04 MED ORDER — INSULIN ASPART 100 UNIT/ML ~~LOC~~ SOLN
0.0000 [IU] | Freq: Three times a day (TID) | SUBCUTANEOUS | Status: DC
  Administered 2019-08-04 – 2019-08-05 (×3): 7 [IU] via SUBCUTANEOUS
  Administered 2019-08-05: 11 [IU] via SUBCUTANEOUS
  Administered 2019-08-05: 4 [IU] via SUBCUTANEOUS
  Administered 2019-08-06: 7 [IU] via SUBCUTANEOUS
  Administered 2019-08-06: 4 [IU] via SUBCUTANEOUS
  Administered 2019-08-07: 3 [IU] via SUBCUTANEOUS
  Administered 2019-08-07: 4 [IU] via SUBCUTANEOUS
  Administered 2019-08-08: 11 [IU] via SUBCUTANEOUS
  Administered 2019-08-08: 7 [IU] via SUBCUTANEOUS
  Administered 2019-08-08 – 2019-08-09 (×3): 4 [IU] via SUBCUTANEOUS
  Administered 2019-08-09: 7 [IU] via SUBCUTANEOUS
  Administered 2019-08-10: 3 [IU] via SUBCUTANEOUS
  Administered 2019-08-10: 7 [IU] via SUBCUTANEOUS
  Administered 2019-08-10: 11 [IU] via SUBCUTANEOUS
  Administered 2019-08-11: 4 [IU] via SUBCUTANEOUS

## 2019-08-04 SURGICAL SUPPLY — 43 items
ADH SKN CLS APL DERMABOND .7 (GAUZE/BANDAGES/DRESSINGS) ×1
APL PRP STRL LF DISP 70% ISPRP (MISCELLANEOUS) ×1
APPLIER CLIP ROT 10 11.4 M/L (STAPLE) ×2
APR CLP MED LRG 11.4X10 (STAPLE) ×1
BAG SPEC RTRVL LRG 6X4 10 (ENDOMECHANICALS) ×1
CABLE HIGH FREQUENCY MONO STRZ (ELECTRODE) ×2 IMPLANT
CHLORAPREP W/TINT 26 (MISCELLANEOUS) ×2 IMPLANT
CLIP APPLIE ROT 10 11.4 M/L (STAPLE) ×1 IMPLANT
COVER MAYO STAND STRL (DRAPES) IMPLANT
COVER SURGICAL LIGHT HANDLE (MISCELLANEOUS) ×2 IMPLANT
COVER WAND RF STERILE (DRAPES) IMPLANT
DECANTER SPIKE VIAL GLASS SM (MISCELLANEOUS) ×2 IMPLANT
DERMABOND ADVANCED (GAUZE/BANDAGES/DRESSINGS) ×1
DERMABOND ADVANCED .7 DNX12 (GAUZE/BANDAGES/DRESSINGS) ×1 IMPLANT
DRAIN CHANNEL 19F RND (DRAIN) ×1 IMPLANT
DRAPE C-ARM 42X120 X-RAY (DRAPES) IMPLANT
ELECT REM PT RETURN 15FT ADLT (MISCELLANEOUS) ×2 IMPLANT
EVACUATOR SILICONE 100CC (DRAIN) ×1 IMPLANT
GLOVE BIO SURGEON STRL SZ 6 (GLOVE) ×2 IMPLANT
GLOVE INDICATOR 6.5 STRL GRN (GLOVE) ×2 IMPLANT
GOWN STRL REUS W/TWL LRG LVL3 (GOWN DISPOSABLE) ×2 IMPLANT
GOWN STRL REUS W/TWL XL LVL3 (GOWN DISPOSABLE) ×4 IMPLANT
GRASPER SUT TROCAR 14GX15 (MISCELLANEOUS) ×2 IMPLANT
HEMOSTAT SNOW SURGICEL 2X4 (HEMOSTASIS) ×2 IMPLANT
KIT BASIN OR (CUSTOM PROCEDURE TRAY) ×2 IMPLANT
KIT TURNOVER KIT A (KITS) IMPLANT
NDL INSUFFLATION 14GA 120MM (NEEDLE) ×1 IMPLANT
NEEDLE INSUFFLATION 14GA 120MM (NEEDLE) ×2 IMPLANT
PENCIL SMOKE EVACUATOR (MISCELLANEOUS) IMPLANT
POUCH SPECIMEN RETRIEVAL 10MM (ENDOMECHANICALS) ×2 IMPLANT
SCISSORS LAP 5X35 DISP (ENDOMECHANICALS) ×2 IMPLANT
SET CHOLANGIOGRAPH MIX (MISCELLANEOUS) IMPLANT
SET IRRIG TUBING LAPAROSCOPIC (IRRIGATION / IRRIGATOR) ×2 IMPLANT
SET TUBE SMOKE EVAC HIGH FLOW (TUBING) ×2 IMPLANT
SLEEVE XCEL OPT CAN 5 100 (ENDOMECHANICALS) ×4 IMPLANT
SPONGE DRAIN TRACH 4X4 STRL 2S (GAUZE/BANDAGES/DRESSINGS) ×1 IMPLANT
SUT ETHILON 2 0 PS N (SUTURE) ×1 IMPLANT
SUT MNCRL AB 4-0 PS2 18 (SUTURE) ×2 IMPLANT
TOWEL OR 17X26 10 PK STRL BLUE (TOWEL DISPOSABLE) ×2 IMPLANT
TOWEL OR NON WOVEN STRL DISP B (DISPOSABLE) IMPLANT
TRAY LAPAROSCOPIC (CUSTOM PROCEDURE TRAY) ×2 IMPLANT
TROCAR BLADELESS OPT 5 100 (ENDOMECHANICALS) ×2 IMPLANT
TROCAR XCEL 12X100 BLDLESS (ENDOMECHANICALS) ×2 IMPLANT

## 2019-08-04 NOTE — Anesthesia Procedure Notes (Addendum)
Procedure Name: Intubation Date/Time: 08/04/2019 9:00 AM Performed by: West Pugh, CRNA Pre-anesthesia Checklist: Patient identified, Emergency Drugs available, Suction available, Patient being monitored and Timeout performed Patient Re-evaluated:Patient Re-evaluated prior to induction Oxygen Delivery Method: Circle system utilized Preoxygenation: Pre-oxygenation with 100% oxygen Induction Type: IV induction Ventilation: Mask ventilation without difficulty Laryngoscope Size: Mac and 3 Grade View: Grade I Tube type: Oral Tube size: 7.0 mm Number of attempts: 1 Airway Equipment and Method: Stylet Placement Confirmation: ETT inserted through vocal cords under direct vision,  positive ETCO2,  CO2 detector and breath sounds checked- equal and bilateral Secured at: 21 cm Tube secured with: Tape Dental Injury: Teeth and Oropharynx as per pre-operative assessment  Comments: Simona Huh, SRNA. DL x 1 attempt. AOI. CRNA and MD present for whole procedure.

## 2019-08-04 NOTE — Transfer of Care (Signed)
Immediate Anesthesia Transfer of Care Note  Patient: Kim Hicks  Procedure(s) Performed: LAPAROSCOPIC CHOLECYSTECTOMY (N/A )  Patient Location: PACU  Anesthesia Type:General  Level of Consciousness: awake, drowsy and patient cooperative  Airway & Oxygen Therapy: Patient Spontanous Breathing and Patient connected to face mask oxygen  Post-op Assessment: Post -op Vital signs reviewed and stable  Post vital signs: Reviewed and stable  Last Vitals:  Vitals Value Taken Time  BP 146/74 08/04/19 1038  Temp    Pulse 91 08/04/19 1040  Resp 16 08/04/19 1040  SpO2 100 % 08/04/19 1040  Vitals shown include unvalidated device data.  Last Pain:  Vitals:   08/04/19 0739  TempSrc:   PainSc: 8          Complications: No apparent anesthesia complications

## 2019-08-04 NOTE — Anesthesia Preprocedure Evaluation (Addendum)
Anesthesia Evaluation  Patient identified by MRN, date of birth, ID band Patient awake    Reviewed: Allergy & Precautions, NPO status , Patient's Chart, lab work & pertinent test results  History of Anesthesia Complications Negative for: history of anesthetic complications  Airway Mallampati: II  TM Distance: >3 FB Neck ROM: Full    Dental  (+) Missing,    Pulmonary  Covid + 06/22/19   Pulmonary exam normal        Cardiovascular hypertension, Pt. on medications Normal cardiovascular exam     Neuro/Psych  Headaches, Anxiety Depression    GI/Hepatic Neg liver ROS, GERD  Controlled,  Endo/Other  diabetes, Type 2, Insulin Dependent  Renal/GU negative Renal ROS  negative genitourinary   Musculoskeletal negative musculoskeletal ROS (+)   Abdominal   Peds  Hematology Hgb 11.6   Anesthesia Other Findings Day of surgery medications reviewed with patient.  Reproductive/Obstetrics negative OB ROS                            Anesthesia Physical Anesthesia Plan  ASA: II  Anesthesia Plan: General   Post-op Pain Management:    Induction: Intravenous  PONV Risk Score and Plan: 4 or greater and Dexamethasone, Ondansetron, Treatment may vary due to age or medical condition and Midazolam  Airway Management Planned: Oral ETT  Additional Equipment: None  Intra-op Plan:   Post-operative Plan: Extubation in OR  Informed Consent: I have reviewed the patients History and Physical, chart, labs and discussed the procedure including the risks, benefits and alternatives for the proposed anesthesia with the patient or authorized representative who has indicated his/her understanding and acceptance.     Dental advisory given  Plan Discussed with: CRNA  Anesthesia Plan Comments:        Anesthesia Quick Evaluation

## 2019-08-04 NOTE — Anesthesia Postprocedure Evaluation (Signed)
Anesthesia Post Note  Patient: Kim Hicks  Procedure(s) Performed: LAPAROSCOPIC CHOLECYSTECTOMY (N/A )     Patient location during evaluation: PACU Anesthesia Type: General Level of consciousness: awake and alert and oriented Pain management: pain level controlled Vital Signs Assessment: post-procedure vital signs reviewed and stable Respiratory status: spontaneous breathing, nonlabored ventilation and respiratory function stable Cardiovascular status: blood pressure returned to baseline Postop Assessment: no apparent nausea or vomiting Anesthetic complications: no    Last Vitals:  Vitals:   08/04/19 1100 08/04/19 1115  BP: (!) 159/68 (!) 165/80  Pulse: 90 93  Resp: (!) 9 20  Temp:  36.8 C  SpO2: 94% 95%    Last Pain:  Vitals:   08/04/19 1100  TempSrc:   PainSc: [place]

## 2019-08-04 NOTE — Op Note (Addendum)
Operative Note  LUZMARIA DEVAUX 54 y.o. female [id number]  08/04/2019  Surgeon: [name] MD FACS  Assistant: Nedra Hai MD FACS  Procedure performed: Laparoscopic fenestrated cholecystectomy  Preop diagnosis: chronic cholecystitis Post-op diagnosis/intraop findings: same, CIRRHOSIS OF THE LIVER, likely portal hypertension  Specimens: gallbladder  Retained items: 73f round blake drain  EBL: [id number] Complications: none  Description of procedure: After obtaining informed consent the patient was brought to the operating room. Antibiotics were administered. SCD's were applied. General endotracheal anesthesia was initiated and a formal time-out was performed. The abdomen was prepped and draped in the usual sterile fashion and the abdomen was entered using a periumbilical veress needle after instilling the site with local. Insufflation to 146mg was obtained, 68m11mrocar and camera inserted, and gross inspection revealed no evidence of injury from our entry. The liver was contracted and nodular, overtly cirrhotic which is a new diagnosis. Two 68mm33mocars were introduced in the right midclavicular and right anterior axillary lines under direct visualization and following infiltration with local. An 11mm24mcar was placed in the epigastrium.   The gallbladder was gently retracted cephalad and the infundibulum was retracted laterally.  Retraction was limited by the cirrhotic liver.  There was significant fibrosising inflammation on the infundibulum and cystic duct and dissection in this area was extremely difficult.  A combination of hook electrocautery and blunt dissection was utilized to clear the peritoneum from the neck and cystic duct, circumferentially isolating the cystic artery and cystic duct and lifting the gallbladder from the cystic plate. Really was not feasible to dissect distally along the cystic duct due to significant scarring in this area. The artery was clipped and divided.   Encountered a posterior branch vessel which was clipped as well.  There was additionally venous bleeding from the superficial veins along the gallbladder.  Ultimately we were around to come around the junction of the gallbladder neck and cystic duct and continue elevating the gallbladder from the liver bed, confirming no other structures entering the gallbladder. The cystic duct was ligated with three clips and divided. In proceeding with dissecting the gallbladder from the liver bed it was noted to be extremely intrahepatic and difficult to separate. Given the cirrhotic liver I felt the safest option was to leave the back wall of the gallbladder in situ. The remaining gallbladder was excised and removed within an endocatch bag through the epigastric trocar site. The liver bed was inspected and old blood aspirated. The clips on the cystic duct are well-apposed and there is no bile leak from the cystic duct or the liver bed/ posterior gallbladder wall. The field was hemostatic.  The right upper quadrant was irrigated copiously until the effluent was clear. Hemostasis was once again confirmed, and reinspection of the abdomen revealed no injuries. Two pieces of surgicel SNOW were placed in the gallbladder fossa and a 19fr 72fd blake drain was placed here as well, exiting the right lateral trocar site. This was secured at the skin with a 2-0 nylon.   The 11mm t47mr site in the epigastrium was closed with a 0 vicryl in the fascia under direct visualization using a PMI device. The abdomen was desufflated and all trocars removed. The skin incisions were closed with subcuticular monocryl and Dermabond. The patient was awakened, extubated and transported to the recovery room in stable condition.    All counts were correct at the completion of the case.

## 2019-08-04 NOTE — H&P (Addendum)
Surgical H&P  CC: abdominal pain  HPI: this is a very nice 54-year-old woman who has a couple of abdominal complaints. Her initial complaint is that she has severe constipation, and indeed had to spend the night in the hospital 3 weeks ago essentially did a golightly prep because she had not had a bowel movement 'in a month'. Since discharge, and has been 3 weeks and she still has not had a bowel movement and describes myriad of different treatments including mag citrate, MiraLAX twice daily, enemas etc. She states her last colonoscopy was here in town but does not recall with whom, and it was at least 10 years ago.  Her other complaint and reason that she is here today is right upper quadrant pain associated with nausea which is exacerbated by eating, especially fatty meals.this has been going on for about one month. She did have an ultrasound at [facility] on November 11. The report describes multiple stones, gallbladder wall measuring 3.4 mm, positive Murphy sign but no pericholecystic fluid, common bile duct 3 mm, coarsened liver echotexture with no focal mass, portal vein patent with normal direction of flow.        Allergies  Allergen Reactions  . Hydrocodone-Acetaminophen Other (See Comments)    Unknown  . Pentazocine-Naloxone Other (See Comments)    Unknown  . Quinapril Hcl Other (See Comments)    Unknown  . Talwin [Pentazocine]        Past Medical History:  Diagnosis Date  . Depression   . Diabetes mellitus without complication (HCC)   . Dysrhythmia   . Edema   . Headache    MIGRAINES  . Hypertension   . Neuromuscular disorder (HCC)    NEUROPATHY  . Palpitations   . Wheezing         Past Surgical History:  Procedure Laterality Date  . ABDOMINAL HYSTERECTOMY    . CATARACT EXTRACTION W/PHACO Right 01/11/2018   Procedure: CATARACT EXTRACTION PHACO AND INTRAOCULAR LENS PLACEMENT (IOC); Surgeon: Porfilio, William, MD; Location: [facility] ORS; Service: Ophthalmology;  Laterality: Right; US 00:57.5  AP% 15.0  CDE 8.61  Fluid Pack Lot # [id number]  . CATARACT EXTRACTION W/PHACO Left 02/15/2018   Procedure: CATARACT EXTRACTION PHACO AND INTRAOCULAR LENS PLACEMENT (IOC); Surgeon: Porfilio, William, MD; Location: [facility] ORS; Service: Ophthalmology; Laterality: Left; US 00:51  AP% 13.2  CDE 6.82  Fluid p ack lot # [id number]  . COLONOSCOPY    . RCR Bilateral    No family history on file.  Social History        Socioeconomic History  . Marital status: Married    Spouse name: Not on file  . Number of children: Not on file  . Years of education: Not on file  . Highest education level: Not on file  Occupational History  . Not on file  Tobacco Use  . Smoking status: Never Smoker  . Smokeless tobacco: Never Used  Substance and Sexual Activity  . Alcohol use: Not Currently  . Drug use: Not on file  . Sexual activity: Not on file  Other Topics Concern  . Not on file  Social History Narrative  . Not on file   Social Determinants of Health      Financial Resource Strain:   . Difficulty of Paying Living Expenses: Not on file  Food Insecurity:   . Worried About Running Out of Food in the Last Year: Not on file  . Ran Out of Food in the Last Year: Not on   file  Transportation Needs:   . Lack of Transportation (Medical): Not on file  . Lack of Transportation (Non-Medical): Not on file  Physical Activity:   . Days of Exercise per Week: Not on file  . Minutes of Exercise per Session: Not on file  Stress:   . Feeling of Stress : Not on file  Social Connections:   . Frequency of Communication with Friends and Family: Not on file  . Frequency of Social Gatherings with Friends and Family: Not on file  . Attends Religious Services: Not on file  . Active Member of Clubs or Organizations: Not on file  . Attends Club or Organization Meetings: Not on file  . Marital Status: Not on file         Current Outpatient Medications on File Prior to Visit   Medication Sig Dispense Refill  . acetaminophen (TYLENOL) 500 MG tablet Take 1,500 mg by mouth every 6 (six) hours as needed for moderate pain or headache.    . Brexpiprazole (REXULTI) 2 MG TABS Take 2 mg by mouth at bedtime.    . Empagliflozin-metFORMIN HCl ER (SYNJARDY XR) 25-1000 MG TB24 Take by mouth.    . furosemide (LASIX) 20 MG tablet Take 20 mg by mouth daily as needed. Patient taking every other day    . gabapentin (NEURONTIN) 400 MG capsule Take 400 mg by mouth 3 (three) times daily.    . meclizine (ANTIVERT) 25 MG tablet TAKE 1 2 TABLETS BY MOUTH EVERY 6 HOURS AS NEEDED    . meloxicam (MOBIC) 15 MG tablet Take 15 mg by mouth daily.    . Omega 3 1000 MG CAPS Take 1 capsule by mouth 3 (three) times daily.    . ondansetron (ZOFRAN) 4 MG tablet TAKE 1 TABLET BY MOUTH AS NEEDED FOR NAUSEA MAY REPEAT ONCE AFTER 4 HOURS    . phentermine (ADIPEX-P) 37.5 MG tablet Take 37.5 mg by mouth daily.    . potassium chloride (KLOR-CON) 20 MEQ packet Take by mouth 2 (two) times daily.    . prazosin (MINIPRESS) 2 MG capsule Take 4 mg by mouth at bedtime.    . QUEtiapine (SEROQUEL) 400 MG tablet Take 800 mg by mouth at bedtime.    . QUEtiapine (SEROQUEL) 50 MG tablet Take 100 mg by mouth daily.    . simvastatin (ZOCOR) 20 MG tablet Take 20 mg by mouth daily.  6  . TIADYLT ER 120 MG 24 hr capsule Take 120 mg by mouth daily.    . tiZANidine (ZANAFLEX) 4 MG tablet Take 4 mg by mouth every 6 (six) hours as needed.    . topiramate (TOPAMAX) 100 MG tablet     . traMADol ([name]) 50 MG tablet Take 50 mg by mouth 2 (two) times daily.    . TRULICITY 1.5 MG/0.5ML SOPN 3 mg.    . vortioxetine HBr ([name]) 20 MG TABS tablet Take 20 mg by mouth at bedtime.    . ziprasidone ([name]) 20 MG capsule Take 20 mg by mouth at bedtime.    . zonisamide ([name]) 100 MG capsule Take 200 mg by mouth at bedtime.     No current facility-administered medications on file prior to visit.   Review of Systems: a complete,  10pt review of systems was completed with pertinent positives and negatives as documented in the HPI  Physical Exam:  Vitals  Weight: 178 lb Height: 65 in  Body Surface Area: 1.88 m Body Mass Index: 29.62 kg/m  Temp.: 97.6 F   Pulse: 108 (Regular) P.OX: 97% (Room air)  BP: 114/70 (Sitting, Left Arm, Standard)  Gen: alert and chronically ill appearing  Eye: extraocular motion intact, no scleral icterus  Chest: unlabored respirations, symmetrical air entry  CV: regular rate and rhythm, no pedal edema  Abdomen: soft, obese, mildly tender in right upper quadrant and epigastrium, nondistended. No mass or organomegaly  MSK: strength symmetrical throughout, no deformity  Neuro: grossly intact, normal gait  Psych: normal mood and affect, appropriate insight  Skin: warm and dry, no rash or lesion on limited exam  CBC Latest Ref Rng & Units 12/14/2012 08/17/2007  WBC 4.5 - 10.5 10*3/microliter - 6.4  Hemoglobin 12.0 - 16.0 g/dL 14.7 14.6  Hematocrit 36.0 - 46.0 % - 43.5  Platelets 150 - 400 K/uL - 183   CMP Latest Ref Rng & Units 12/14/2012 08/17/2007  Glucose 65 - 99 mg/dL 233(H) 390(H)  BUN 7 - 18 mg/dL 17 8  Creatinine 0.60 - 1.30 mg/dL 0.46(L) 0.5  Sodium 136 - 145 mmol/L 137 137  Potassium 3.5 - 5.1 mmol/L 4.0 4.2  Chloride 98 - 107 mmol/L 104 101  CO2 21 - 32 mmol/L 27 28  Calcium 8.5 - 10.1 mg/dL 9.3 9.1  Total Protein 6.0 - 8.3 g/dL - 7.3  Total Bilirubin 0.3 - 1.2 mg/dL - 0.8  Alkaline Phos 39 - 117 units/L - 108  AST 0 - 37 units/L - 88(H)  ALT 0 - 35 units/L - 80(H)   Recent Labs    Imaging:  Imaging Results (Last 48 hours)    A/P: BILIARY COLIC (K80.50)  Story: I recommend proceeding with laparoscopic cholecystectomy with possible cholangiogram. Discussed risks of surgery including bleeding, pain, scarring, intraabdominal injury specifically to the common bile duct and sequelae, bile leak, conversion to open surgery, failure to resolve symptoms, blood clots/ pulmonary  embolus, heart attack, pneumonia, stroke, death. Patient was advised that surgery and pain medication will only aggravate her significant constipation and I do recommend she meet with the gastroenterologist to work on this as well as to consider colonoscopy given that it's been over 10 years. We will place a referral today. Questions welcomed and answered to patient's satisfaction. She would like to go ahead and schedule her gallbladder surgery.   This was scheduled for late January however her preop covid screening test was positive. She was asymptomatic at the time, but did develop severe headache and sore throat about a week later. She is back to baseline now. Continues to have postprandial RUQ pain nausea and vomiting. Constipation is improved now on linzess. She will be having a colonoscopy in the next 1-2 months.   Labs earlier this week notable for A1c 9.5, alk phos 143, AST 78, ALT 48, bilirubin 0.6- these seem to be her baseline as minimal change from last month. Note coarsened liver echotexture on US. Coags and Plts within normal limits (see care-everywhere).      Patient Active Problem List   Diagnosis Date Noted  . DYSPHAGIA UNSPECIFIED 03/04/2009  . GERD 02/18/2009  . PERSISTENT VOMITING 02/18/2009  . CHEST PAIN 02/18/2009  . NAUSEA AND VOMITING 02/18/2009  . DEHYDRATION 07/19/2007  . GASTROENTERITIS 07/19/2007  . FIBROIDS, UTERUS 05/02/2007  . DEPRESSION 05/02/2007  . MIGRAINE HEADACHE 05/02/2007  . Essential hypertension 05/02/2007  . HEMORRHOIDS 05/02/2007  . Constipation 05/02/2007  . IBS 05/02/2007  . PSORIASIS 05/02/2007  . PITUITARY NEOPLASM, HX OF 05/02/2007  . LIVER FUNCTION TESTS, ABNORMAL, HX OF 05/02/2007  . DIABETES MELLITUS,   TYPE II 02/04/2007  . HYPERLIPIDEMIA 02/04/2007  . SINUSITIS, ACUTE 02/04/2007  . ABDOMINAL PAIN, RIGHT UPPER QUADRANT 02/04/2007    , MD  [facility]  See AMION to contact appropriate on-call provider     

## 2019-08-05 ENCOUNTER — Ambulatory Visit (HOSPITAL_COMMUNITY): Payer: Medicare Other

## 2019-08-05 LAB — CBC
HCT: 35.1 % — ABNORMAL LOW (ref 36.0–46.0)
HCT: 33 % — ABNORMAL LOW (ref 36.0–46.0)
Hemoglobin: 10.5 g/dL — ABNORMAL LOW (ref 12.0–15.0)
Hemoglobin: 9.7 g/dL — ABNORMAL LOW (ref 12.0–15.0)
MCH: 26.1 pg (ref 26.0–34.0)
MCH: 26.1 pg (ref 26.0–34.0)
MCHC: 29.9 g/dL — ABNORMAL LOW (ref 30.0–36.0)
MCHC: 29.4 g/dL — ABNORMAL LOW (ref 30.0–36.0)
MCV: 87.3 fL (ref 80.0–100.0)
MCV: 88.9 fL (ref 80.0–100.0)
Platelets: 133 10*3/uL — ABNORMAL LOW (ref 150–400)
Platelets: 132 10*3/uL — ABNORMAL LOW (ref 150–400)
RBC: 4.02 MIL/uL (ref 3.87–5.11)
RBC: 3.71 MIL/uL — ABNORMAL LOW (ref 3.87–5.11)
RDW: 14.3 % (ref 11.5–15.5)
RDW: 14.4 % (ref 11.5–15.5)
WBC: 9.9 10*3/uL (ref 4.0–10.5)
WBC: 9.5 10*3/uL (ref 4.0–10.5)
nRBC: 0 % (ref 0.0–0.2)
nRBC: 0 % (ref 0.0–0.2)

## 2019-08-05 LAB — COMPREHENSIVE METABOLIC PANEL
ALT: 55 U/L — ABNORMAL HIGH (ref 0–44)
ALT: 48 U/L — ABNORMAL HIGH (ref 0–44)
AST: 85 U/L — ABNORMAL HIGH (ref 15–41)
AST: 67 U/L — ABNORMAL HIGH (ref 15–41)
Albumin: 3.6 g/dL (ref 3.5–5.0)
Albumin: 3.5 g/dL (ref 3.5–5.0)
Alkaline Phosphatase: 107 U/L (ref 38–126)
Alkaline Phosphatase: 106 U/L (ref 38–126)
Anion gap: 15 (ref 5–15)
Anion gap: 12 (ref 5–15)
BUN: 16 mg/dL (ref 6–20)
BUN: 19 mg/dL (ref 6–20)
CO2: 17 mmol/L — ABNORMAL LOW (ref 22–32)
CO2: 20 mmol/L — ABNORMAL LOW (ref 22–32)
Calcium: 8.3 mg/dL — ABNORMAL LOW (ref 8.9–10.3)
Calcium: 8.3 mg/dL — ABNORMAL LOW (ref 8.9–10.3)
Chloride: 104 mmol/L (ref 98–111)
Chloride: 101 mmol/L (ref 98–111)
Creatinine, Ser: 0.61 mg/dL (ref 0.44–1.00)
Creatinine, Ser: 0.65 mg/dL (ref 0.44–1.00)
GFR calc Af Amer: >60 mL/min (ref >60)
GFR calc Af Amer: >60 mL/min (ref >60)
GFR calc non Af Amer: >60 mL/min (ref >60)
GFR calc non Af Amer: >60 mL/min (ref >60)
Glucose, Bld: 198 mg/dL — ABNORMAL HIGH (ref 70–99)
Glucose, Bld: 251 mg/dL — ABNORMAL HIGH (ref 70–99)
Potassium: 4 mmol/L (ref 3.5–5.1)
Potassium: 4 mmol/L (ref 3.5–5.1)
Sodium: 136 mmol/L (ref 135–145)
Sodium: 133 mmol/L — ABNORMAL LOW (ref 135–145)
Total Bilirubin: 1.2 mg/dL (ref 0.3–1.2)
Total Bilirubin: 1.3 mg/dL — ABNORMAL HIGH (ref 0.3–1.2)
Total Protein: 6.4 g/dL — ABNORMAL LOW (ref 6.5–8.1)
Total Protein: 6.5 g/dL (ref 6.5–8.1)

## 2019-08-05 LAB — PROTIME-INR
INR: 1.2 (ref 0.8–1.2)
Prothrombin Time: 14.9 seconds (ref 11.4–15.2)

## 2019-08-05 LAB — GLUCOSE, CAPILLARY
Glucose-Capillary: 214 mg/dL — ABNORMAL HIGH (ref 70–99)
Glucose-Capillary: 194 mg/dL — ABNORMAL HIGH (ref 70–99)
Glucose-Capillary: 271 mg/dL — ABNORMAL HIGH (ref 70–99)
Glucose-Capillary: 265 mg/dL — ABNORMAL HIGH (ref 70–99)
Glucose-Capillary: 277 mg/dL — ABNORMAL HIGH (ref 70–99)
Glucose-Capillary: 305 mg/dL — ABNORMAL HIGH (ref 70–99)

## 2019-08-05 LAB — TROPONIN I (HIGH SENSITIVITY): Troponin I (High Sensitivity): 2 ng/L (ref <18)

## 2019-08-05 LAB — MAGNESIUM: Magnesium: 2 mg/dL (ref 1.7–2.4)

## 2019-08-05 LAB — APTT: aPTT: 30 seconds (ref 24–36)

## 2019-08-05 NOTE — Progress Notes (Signed)
   Vital Signs MEWS/VS Documentation      08/05/2019 0558 08/05/2019 0750 08/05/2019 1007 08/05/2019 1053   MEWS Score:  1  1  2  1    MEWS Score Color:  Green  Green  Yellow  Green   Resp:  20  -  18  -   Pulse:  (!) 105  -  (!) 105  (!) 109   BP:  (!) 108/52  -  (!) 100/49  107/64   Temp:  98.7 F (37.1 C)  -  (!) 97.5 F (36.4 C)  -   O2 Device:  -  -  Room Air  -   Level of Consciousness:  -  Alert  -  -       Rechecked BP and P.  It came up a small amount, but still remains within the baseline.  No complaints from patient.  Sitting in bed talking with her husband.    Delford Field 08/05/2019,11:11 AM

## 2019-08-05 NOTE — Progress Notes (Signed)
1 Day Post-Op   Subjective/Chief Complaint: Complains of RUQ pain   Objective: Vital signs in last 24 hours: Temp:  [97.6 F (36.4 C)-99.3 F (37.4 C)] 98.7 F (37.1 C) (03/13 0558) Pulse Rate:  [90-105] 105 (03/13 0558) Resp:  [9-20] 20 (03/13 0558) BP: (103-165)/(52-86) 108/52 (03/13 0558) SpO2:  [93 %-100 %] 95 % (03/13 0558) Last BM Date: 08/03/19  Intake/Output from previous day: 03/12 0701 - 03/13 0700 In: 1080 [P.O.:180; I.V.:800; IV Piggyback:100] Out: 1610 [Urine:2390; Drains:510; Blood:50] Intake/Output this shift: No intake/output data recorded.  Exam: Awake and alert, mild to moderately uncomfortable Abdomen soft, appropriately tender Drain with bile tinge  Lab Results:  Recent Labs    08/05/19 0530  WBC 9.9  HGB 10.5*  HCT 35.1*  PLT 133*   BMET Recent Labs    08/05/19 0530  NA 136  K 4.0  CL 104  CO2 17*  GLUCOSE 198*  BUN 16  CREATININE 0.61  CALCIUM 8.3*   PT/INR Recent Labs    08/05/19 0530  LABPROT 14.9  INR 1.2   ABG No results for input(s): PHART, HCO3 in the last 72 hours.  Invalid input(s): PCO2, PO2  Studies/Results: No results found.  Anti-infectives: Anti-infectives (From admission, onward)   Start     Dose/Rate Route Frequency Ordered Stop   08/04/19 0730  ceFAZolin (ANCEF) IVPB 2g/100 mL premix     2 g 200 mL/hr over 30 Minutes Intravenous On call to O.R. 08/04/19 [id number] 08/04/19 0911   07/08/19 0930  ceFAZolin (ANCEF) 2 g in dextrose 5 % 100 mL IVPB  Status:  Discontinued     2 g 240 mL/hr over 30 Minutes Intravenous On call to O.R. 07/08/19 [id number] 07/08/19 0918      Assessment/Plan: s/p Procedure(s): LAPAROSCOPIC CHOLECYSTECTOMY (N/A)  Will continue to monitor Drain with bile tinge but only put out 55 cc.  LFT's about normal Will repeat labs in the morning  LOS: 0 days    Coralie Keens 08/05/2019

## 2019-08-05 NOTE — ED Provider Notes (Signed)
I was called upstairs for CODE BLUE regarding this patient.  She apparently was using the toilet and became lightheaded and fell over, pulling the help cord.  A technician found her lying on the ground.  There is no report of pulselessness.  Patient was moaning and awake.  By the time I came to the room she was being assisted back up into her bed.  She reports that she is felt woozy and lightheaded most of the day.  She felt unsteady on her feet.  The nurse been taking her at home and she had been issues with blood pressure being on the low side.  Her blood pressure in the room was 872 systolic when rechecked.  Her heart rate was in the normal limits.  She was not hypoxic.  She was mentating well and able to answer all my questions.  Her blood sugar was within normal limits.  I felt she was medically stable from an emergent standpoint, and may have had a vasovagal episode, as she is recently post-op and has significant pain.  No evidence of large PE on my assessment. I advised her staff to reach out to her primary treatment team regarding further workup.   Wyvonnia Dusky, MD 08/05/19 [phone]

## 2019-08-05 NOTE — Plan of Care (Signed)
Progressing nicely.  Ambulating in hall with husband.  Encourage walker use to avoid losing balance.

## 2019-08-05 NOTE — Significant Event (Signed)
Rapid Response Event Note  Overview:  Medical Alert was called overhead. Upon arrival, patient was in bathroom floor. Per NTs, patient was on toilet in bathroom and pulled the call assistance cord Patient appeared unresponsive and breathing minimally, and NTs lowered patient to floor. Per NTs (Belinda and Quincess), patient did not fall. Medical Alert was called due to patient appearing to deteriorate rapidly. Once Rapid Response arrived, patient was breathing and moving upper extremities, but eyes were closed. Patient did answer questions upon asking. CBG was checked, sheet was placed under patient, patient was slid to side of bed with sheet and lifted with Rapid Response Team. Patient's O2 Sats were 95%, patient placed on 6Liters. EKG Rhythm per zoll, appeared to be NSR-ST, HR 90s-101. BP was WNL, RR 12-16.     Initial Focused Assessment: Neuro: Patient slightly drowsy but able to follow all commands, patient able to move upper and lower extremities with ease. Pupils were 2, responsive to light, brisk, round and equal bilaterally. Patient was oriented x4. Patient could not explain why she had gotten up without assistance. Patient did state "I have gotten light headed before and fallen before."  Cardiac:  pulses 1-2+ in radial and pedal locations bilaterally, NSR-ST, HR 90s-100. See vital signs  Pulmonary: Patient not in any respiratory distress, placed on 6L [place], weaned down to [facility] when and patient's O2 Sats remained 100%.   Interventions: Bedside RN Notified MD [place]mon  -STAT CBC, CMP placed, EKG  -No transfer at this time   Plan of Care (if not transferred): Patient does not need to get up without assistance, bed alarm and or chair alarm needs to be in place and activated, floor mats need to be in use at bedside. Low bed and Telesitter are needed as well since patient is a high fall risk. If patient's clinical status changes or MEWS increases, please call Rapid Response.          Kim Hicks,Kim Hicks

## 2019-08-05 NOTE — Progress Notes (Signed)
Patient with Yellow Mews.  BP 100/49.  This has been baseline since last night.  Held BP medicine and will reevaluate pulse and BP.  No change in mentation.  Skin warm and dry.  No complaint of chest pain.

## 2019-08-05 NOTE — Progress Notes (Signed)
Emergency bathroom alarm was triggered.  Upon arrival to the room, the NTs had assisted patient from the toilet to the floor.   She never loss consciousness or ceased breathing, but was a little slow to respond at first.   Code Blue was called.   Dr. Coralie Keens was called for CCS.  He ordered CSR, CBC, CMP and EKG.  EKG came back with "cannot rule out an anterior infarct, age unknown." so he added Cardiac Enzymes.  I called the patient's husband and let him talk with her.    Patient had asked to sit in the rocking chair in the room and did not have a chair alarm placed.  All staff reminded to be sure she kept a bed alarm and a chair alarm on at all times.  Mats and low bed were added as well.  She is alert and oriented, skin warm and dry and VS WNL.

## 2019-08-06 ENCOUNTER — Inpatient Hospital Stay (HOSPITAL_COMMUNITY): Payer: Medicare Other

## 2019-08-06 DIAGNOSIS — I10 Essential (primary) hypertension: Secondary | ICD-10-CM | POA: Diagnosis present

## 2019-08-06 DIAGNOSIS — E785 Hyperlipidemia, unspecified: Secondary | ICD-10-CM | POA: Diagnosis present

## 2019-08-06 DIAGNOSIS — K8010 Calculus of gallbladder with chronic cholecystitis without obstruction: Secondary | ICD-10-CM | POA: Diagnosis present

## 2019-08-06 DIAGNOSIS — K5909 Other constipation: Secondary | ICD-10-CM | POA: Diagnosis not present

## 2019-08-06 DIAGNOSIS — Z6828 Body mass index (BMI) 28.0-28.9, adult: Secondary | ICD-10-CM | POA: Diagnosis not present

## 2019-08-06 DIAGNOSIS — K839 Disease of biliary tract, unspecified: Secondary | ICD-10-CM | POA: Diagnosis not present

## 2019-08-06 DIAGNOSIS — Z8616 Personal history of COVID-19: Secondary | ICD-10-CM | POA: Diagnosis not present

## 2019-08-06 DIAGNOSIS — Z7984 Long term (current) use of oral hypoglycemic drugs: Secondary | ICD-10-CM | POA: Diagnosis not present

## 2019-08-06 DIAGNOSIS — Z79899 Other long term (current) drug therapy: Secondary | ICD-10-CM | POA: Diagnosis not present

## 2019-08-06 DIAGNOSIS — K7460 Unspecified cirrhosis of liver: Secondary | ICD-10-CM | POA: Diagnosis present

## 2019-08-06 DIAGNOSIS — E1140 Type 2 diabetes mellitus with diabetic neuropathy, unspecified: Secondary | ICD-10-CM | POA: Diagnosis present

## 2019-08-06 DIAGNOSIS — Z79891 Long term (current) use of opiate analgesic: Secondary | ICD-10-CM | POA: Diagnosis not present

## 2019-08-06 DIAGNOSIS — K581 Irritable bowel syndrome with constipation: Secondary | ICD-10-CM | POA: Diagnosis present

## 2019-08-06 DIAGNOSIS — Z888 Allergy status to other drugs, medicaments and biological substances status: Secondary | ICD-10-CM | POA: Diagnosis not present

## 2019-08-06 DIAGNOSIS — K766 Portal hypertension: Secondary | ICD-10-CM | POA: Diagnosis present

## 2019-08-06 DIAGNOSIS — K9189 Other postprocedural complications and disorders of digestive system: Secondary | ICD-10-CM | POA: Diagnosis not present

## 2019-08-06 DIAGNOSIS — E669 Obesity, unspecified: Secondary | ICD-10-CM | POA: Diagnosis present

## 2019-08-06 DIAGNOSIS — Z90710 Acquired absence of both cervix and uterus: Secondary | ICD-10-CM | POA: Diagnosis not present

## 2019-08-06 DIAGNOSIS — Z885 Allergy status to narcotic agent status: Secondary | ICD-10-CM | POA: Diagnosis not present

## 2019-08-06 DIAGNOSIS — Z23 Encounter for immunization: Secondary | ICD-10-CM | POA: Diagnosis present

## 2019-08-06 DIAGNOSIS — K838 Other specified diseases of biliary tract: Secondary | ICD-10-CM | POA: Diagnosis not present

## 2019-08-06 DIAGNOSIS — F329 Major depressive disorder, single episode, unspecified: Secondary | ICD-10-CM | POA: Diagnosis present

## 2019-08-06 DIAGNOSIS — K8050 Calculus of bile duct without cholangitis or cholecystitis without obstruction: Secondary | ICD-10-CM | POA: Diagnosis present

## 2019-08-06 LAB — CBC
HCT: 32.3 % — ABNORMAL LOW (ref 36.0–46.0)
Hemoglobin: 9.5 g/dL — ABNORMAL LOW (ref 12.0–15.0)
MCH: 26.2 pg (ref 26.0–34.0)
MCHC: 29.4 g/dL — ABNORMAL LOW (ref 30.0–36.0)
MCV: 89 fL (ref 80.0–100.0)
Platelets: 135 10*3/uL — ABNORMAL LOW (ref 150–400)
RBC: 3.63 MIL/uL — ABNORMAL LOW (ref 3.87–5.11)
RDW: 14.4 % (ref 11.5–15.5)
WBC: 8.3 10*3/uL (ref 4.0–10.5)
nRBC: 0 % (ref 0.0–0.2)

## 2019-08-06 LAB — COMPREHENSIVE METABOLIC PANEL
ALT: 42 U/L (ref 0–44)
AST: 56 U/L — ABNORMAL HIGH (ref 15–41)
Albumin: 3.5 g/dL (ref 3.5–5.0)
Alkaline Phosphatase: 94 U/L (ref 38–126)
Anion gap: 13 (ref 5–15)
BUN: 19 mg/dL (ref 6–20)
CO2: 20 mmol/L — ABNORMAL LOW (ref 22–32)
Calcium: 8.7 mg/dL — ABNORMAL LOW (ref 8.9–10.3)
Chloride: 103 mmol/L (ref 98–111)
Creatinine, Ser: 0.62 mg/dL (ref 0.44–1.00)
GFR calc Af Amer: >60 mL/min (ref >60)
GFR calc non Af Amer: >60 mL/min (ref >60)
Glucose, Bld: 223 mg/dL — ABNORMAL HIGH (ref 70–99)
Potassium: 3.8 mmol/L (ref 3.5–5.1)
Sodium: 136 mmol/L (ref 135–145)
Total Bilirubin: 1.3 mg/dL — ABNORMAL HIGH (ref 0.3–1.2)
Total Protein: 6.6 g/dL (ref 6.5–8.1)

## 2019-08-06 LAB — GLUCOSE, CAPILLARY
Glucose-Capillary: 208 mg/dL — ABNORMAL HIGH (ref 70–99)
Glucose-Capillary: 154 mg/dL — ABNORMAL HIGH (ref 70–99)
Glucose-Capillary: 122 mg/dL — ABNORMAL HIGH (ref 70–99)
Glucose-Capillary: 152 mg/dL — ABNORMAL HIGH (ref 70–99)
Glucose-Capillary: 209 mg/dL — ABNORMAL HIGH (ref 70–99)

## 2019-08-06 MED ORDER — OXYCODONE HCL 5 MG PO TABS
5.0000 mg | ORAL_TABLET | ORAL | Status: DC | PRN
  Administered 2019-08-06 – 2019-08-07 (×4): 5 mg via ORAL
  Filled 2019-08-06 (×4): qty 1

## 2019-08-06 MED ORDER — TECHNETIUM TC 99M MEBROFENIN IV KIT
5.4000 | PACK | Freq: Once | INTRAVENOUS | Status: AC
  Administered 2019-08-06: 5.4 via INTRAVENOUS

## 2019-08-06 MED ORDER — LIP MEDEX EX OINT
TOPICAL_OINTMENT | CUTANEOUS | Status: AC
  Filled 2019-08-06: qty 7

## 2019-08-06 MED ORDER — ACETAMINOPHEN 500 MG PO TABS
500.0000 mg | ORAL_TABLET | Freq: Once | ORAL | Status: AC
  Administered 2019-08-06: 500 mg via ORAL
  Filled 2019-08-06: qty 1

## 2019-08-06 NOTE — Progress Notes (Signed)
2 Days Post-Op   Subjective/Chief Complaint: Got light headed on the toilet last evening and almost passed out.  Rapid response called.  CXR, EKG, troponins, hgb all checked and were ok. She feels ok this morning.  Still with moderate abdominal pain   Objective: Vital signs in last 24 hours: Temp:  [97.5 F (36.4 C)-100.8 F (38.2 C)] 99.7 F (37.6 C) (03/14 0722) Pulse Rate:  [84-109] 98 (03/14 0554) Resp:  [16-18] 18 (03/14 0545) BP: (100-137)/(49-67) 104/60 (03/14 0545) SpO2:  [92 %-100 %] 92 % (03/14 0545) Weight:  [78 kg] 78 kg (03/13 1500) Last BM Date: 08/03/19  Intake/Output from previous day: 03/13 0701 - 03/14 0700 In: 504.2 [P.O.:360; I.V.:144.2] Out: 2120 [Urine:1700; Drains:420] Intake/Output this shift: No intake/output data recorded.  Exam: Awake and alert Abdomen obese, soft, mildly tender with no peritonitis Drain still bile tinged  Lab Results:  Recent Labs    08/05/19 1853 08/06/19 0503  WBC 9.5 8.3  HGB 9.7* 9.5*  HCT 33.0* 32.3*  PLT 132* 135*   BMET Recent Labs    08/05/19 1853 08/06/19 0503  NA 133* 136  K 4.0 3.8  CL 101 103  CO2 20* 20*  GLUCOSE 251* 223*  BUN 19 19  CREATININE 0.65 0.62  CALCIUM 8.3* 8.7*   PT/INR Recent Labs    08/05/19 0530  LABPROT 14.9  INR 1.2   ABG No results for input(s): PHART, HCO3 in the last 72 hours.  Invalid input(s): PCO2, PO2  Studies/Results: DG CHEST PORT 1 VIEW  Result Date: 08/05/2019 CLINICAL DATA:  Syncope EXAM: PORTABLE CHEST 1 VIEW COMPARISON:  08/01/2019 FINDINGS: Cardiomegaly. Subtle heterogeneous airspace opacity of the left lung base. The visualized skeletal structures are unremarkable. IMPRESSION: 1.  Cardiomegaly. 2. Subtle heterogeneous airspace opacity of the left lung base, which may reflect atelectasis or infection. Electronically Signed   By: Eddie Candle M.D.   On: 08/05/2019 18:10    Anti-infectives: Anti-infectives (From admission, onward)   Start     Dose/Rate  Route Frequency Ordered Stop   08/04/19 0730  ceFAZolin (ANCEF) IVPB 2g/100 mL premix     2 g 200 mL/hr over 30 Minutes Intravenous On call to O.R. 08/04/19 [id number] 08/04/19 0911   07/08/19 0930  ceFAZolin (ANCEF) 2 g in dextrose 5 % 100 mL IVPB  Status:  Discontinued     2 g 240 mL/hr over 30 Minutes Intravenous On call to O.R. 07/08/19 [id number] 07/08/19 0918      Assessment/Plan: s/p Procedure(s): LAPAROSCOPIC CHOLECYSTECTOMY (N/A)  Will check a HIDA today to rule out a bile leak. LFT"s still stable. NPO until after scan Hgb stable   LOS: 0 days    Coralie Keens 08/06/2019

## 2019-08-06 NOTE — Progress Notes (Signed)
HIDA results noted; consistent with contained bile leak. I have spoken with Dr. Ardis Hughs and GI will see her tomorrow to plan ERCP/stent. Keep NPO after midnight.

## 2019-08-06 NOTE — Progress Notes (Signed)
MD paged about patient having a temp of 100.8 F this morning, patient is calm with no complaints. New orders for one time dose of Tylenol.  Will follow through with orders and monitor patient. Norlene Duel RN, BSN

## 2019-08-06 NOTE — Plan of Care (Signed)
  Problem: Education: Goal: Required Educational Video(s) Outcome: Progressing   Problem: Clinical Measurements: Goal: Postoperative complications will be avoided or minimized Outcome: Progressing   Problem: Skin Integrity: Goal: Demonstration of wound healing without infection will improve Outcome: Progressing

## 2019-08-06 NOTE — Progress Notes (Signed)
Paged Dr. Marlou Starks regarding patients hida scan results. No new orders at this time

## 2019-08-06 NOTE — Assessment & Plan Note (Signed)
MELD 9/ Ardine Eng A as of 08/04/19

## 2019-08-07 ENCOUNTER — Inpatient Hospital Stay (HOSPITAL_COMMUNITY): Payer: Medicare Other

## 2019-08-07 ENCOUNTER — Inpatient Hospital Stay (HOSPITAL_COMMUNITY): Payer: Medicare Other | Admitting: Certified Registered Nurse Anesthetist

## 2019-08-07 ENCOUNTER — Encounter (HOSPITAL_COMMUNITY): Payer: Self-pay | Admitting: Surgery

## 2019-08-07 ENCOUNTER — Encounter (HOSPITAL_COMMUNITY)
Admission: RE | Disposition: A | Discharge status: Home Health | Payer: Self-pay | Source: Home / Self Care | Attending: Surgery

## 2019-08-07 DIAGNOSIS — K7460 Unspecified cirrhosis of liver: Secondary | ICD-10-CM

## 2019-08-07 DIAGNOSIS — K838 Other specified diseases of biliary tract: Secondary | ICD-10-CM

## 2019-08-07 DIAGNOSIS — K839 Disease of biliary tract, unspecified: Secondary | ICD-10-CM | POA: Diagnosis not present

## 2019-08-07 DIAGNOSIS — R932 Abnormal findings on diagnostic imaging of liver and biliary tract: Secondary | ICD-10-CM | POA: Diagnosis present

## 2019-08-07 HISTORY — PX: SPHINCTEROTOMY: SHX5544

## 2019-08-07 HISTORY — PX: REMOVAL OF STONES: SHX5545

## 2019-08-07 HISTORY — PX: BILIARY STENT PLACEMENT: SHX5538

## 2019-08-07 HISTORY — PX: ERCP: SHX5425

## 2019-08-07 LAB — GLUCOSE, CAPILLARY
Glucose-Capillary: 137 mg/dL — ABNORMAL HIGH (ref 70–99)
Glucose-Capillary: 118 mg/dL — ABNORMAL HIGH (ref 70–99)
Glucose-Capillary: 165 mg/dL — ABNORMAL HIGH (ref 70–99)
Glucose-Capillary: 212 mg/dL — ABNORMAL HIGH (ref 70–99)

## 2019-08-07 LAB — SURGICAL PATHOLOGY

## 2019-08-07 LAB — MRSA PCR SCREENING: MRSA by PCR: NEGATIVE

## 2019-08-07 SURGERY — ERCP, WITH INTERVENTION IF INDICATED
Anesthesia: General

## 2019-08-07 MED ORDER — INDOMETHACIN 50 MG RE SUPP
RECTAL | Status: DC | PRN
  Administered 2019-08-07: 100 mg via RECTAL

## 2019-08-07 MED ORDER — LIDOCAINE 2% (20 MG/ML) 5 ML SYRINGE
INTRAMUSCULAR | Status: DC | PRN
  Administered 2019-08-07: 40 mg via INTRAVENOUS
  Administered 2019-08-07: 60 mg via INTRAVENOUS

## 2019-08-07 MED ORDER — PROPOFOL 10 MG/ML IV BOLUS
INTRAVENOUS | Status: DC | PRN
  Administered 2019-08-07: 150 mg via INTRAVENOUS

## 2019-08-07 MED ORDER — LACTATED RINGERS IV SOLN: INTRAVENOUS | Status: DC

## 2019-08-07 MED ORDER — GLUCAGON HCL RDNA (DIAGNOSTIC) 1 MG IJ SOLR
INTRAMUSCULAR | Status: DC | PRN
  Administered 2019-08-07 (×2): .25 mg via INTRAVENOUS

## 2019-08-07 MED ORDER — SODIUM CHLORIDE 0.9 % IV SOLN
1.5000 g | Freq: Four times a day (QID) | INTRAVENOUS | Status: DC
  Administered 2019-08-07 – 2019-08-10 (×13): 1.5 g via INTRAVENOUS
  Filled 2019-08-07 (×13): qty 1.5

## 2019-08-07 MED ORDER — MIDAZOLAM HCL 2 MG/2ML IJ SOLN
INTRAMUSCULAR | Status: AC
  Filled 2019-08-07: qty 2

## 2019-08-07 MED ORDER — INDOMETHACIN 50 MG RE SUPP: 100.0000 mg | Freq: Once | RECTAL | Status: DC

## 2019-08-07 MED ORDER — PHENYLEPHRINE 40 MCG/ML (10ML) SYRINGE FOR IV PUSH (FOR BLOOD PRESSURE SUPPORT)
PREFILLED_SYRINGE | INTRAVENOUS | Status: DC | PRN
  Administered 2019-08-07: 80 ug via INTRAVENOUS

## 2019-08-07 MED ORDER — SUCCINYLCHOLINE CHLORIDE 200 MG/10ML IV SOSY
PREFILLED_SYRINGE | INTRAVENOUS | Status: DC | PRN
  Administered 2019-08-07: 140 mg via INTRAVENOUS

## 2019-08-07 MED ORDER — FENTANYL CITRATE (PF) 100 MCG/2ML IJ SOLN
INTRAMUSCULAR | Status: AC
  Filled 2019-08-07: qty 2

## 2019-08-07 MED ORDER — FENTANYL CITRATE (PF) 100 MCG/2ML IJ SOLN
INTRAMUSCULAR | Status: DC | PRN
  Administered 2019-08-07 (×2): 25 ug via INTRAVENOUS
  Administered 2019-08-07: 50 ug via INTRAVENOUS

## 2019-08-07 MED ORDER — DEXAMETHASONE SODIUM PHOSPHATE 10 MG/ML IJ SOLN
INTRAMUSCULAR | Status: DC | PRN
  Administered 2019-08-07: 4 mg via INTRAVENOUS

## 2019-08-07 MED ORDER — MIDAZOLAM HCL 5 MG/5ML IJ SOLN
INTRAMUSCULAR | Status: DC | PRN
  Administered 2019-08-07 (×2): 1 mg via INTRAVENOUS

## 2019-08-07 MED ORDER — GLUCAGON HCL RDNA (DIAGNOSTIC) 1 MG IJ SOLR
INTRAMUSCULAR | Status: AC
  Filled 2019-08-07: qty 1

## 2019-08-07 MED ORDER — SODIUM CHLORIDE 0.9 % IV SOLN
INTRAVENOUS | Status: DC | PRN
  Administered 2019-08-07: 20 mL

## 2019-08-07 MED ORDER — ONDANSETRON HCL 4 MG/2ML IJ SOLN
INTRAMUSCULAR | Status: DC | PRN
  Administered 2019-08-07: 4 mg via INTRAVENOUS

## 2019-08-07 MED ORDER — PROPOFOL 10 MG/ML IV BOLUS
INTRAVENOUS | Status: AC
  Filled 2019-08-07: qty 20

## 2019-08-07 MED ORDER — INDOMETHACIN 50 MG RE SUPP
RECTAL | Status: AC
  Filled 2019-08-07: qty 2

## 2019-08-07 NOTE — Progress Notes (Addendum)
S: Over all feeling well; reports abdominal pain. Had a fall Saturday night so the bed is on the ground.   Vitals, labs, intake/output, and orders reviewed at this time.  Gen: A&Ox3, no distress  H&N: EOMI, atraumatic, neck supple Chest: unlabored respirations, RRR. Only able to get to about 500 on IS Abd: obese, soft, appropriately mildly tender, nondistended, incisions c/d/i with dermabond, no cellulitis or hematoma. JP output bilious, 225cc last 24h.  Ext: warm, no edema Neuro: grossly normal  Lines/tubes/drains: PIV. Surgical drain.   A/P:  POD 3 s/p laparoscopic cholecystectomy Cirrhosis: Ardine Eng A/ MELD 8-9, patient advised of diagnosis and implications Bile leak: GI to see today for ERCP/stent. If procedure is not scheduled for today ok to advance to carb modified diet and make NPO at midnight  Continue supportive care and close surgical observation.   Romana Juniper, MD [facility], [place] Pager [phone]

## 2019-08-07 NOTE — Progress Notes (Signed)
Consent form signed by patient, husband present.

## 2019-08-07 NOTE — Interval H&P Note (Signed)
History and Physical Interval Note:  08/07/2019 1:11 PM  Kim Hicks  has presented today for surgery, with the diagnosis of bile leak.  The various methods of treatment have been discussed with the patient and family. After consideration of risks, benefits and other options for treatment, the patient has consented to  Procedure(s): ENDOSCOPIC RETROGRADE CHOLANGIOPANCREATOGRAPHY (ERCP) (N/A) as a surgical intervention.  The patient's history has been reviewed, patient examined, no change in status, stable for surgery.  I have reviewed the patient's chart and labs.  Questions were answered to the patient's satisfaction.     Pricilla Riffle. Fuller Plan

## 2019-08-07 NOTE — Anesthesia Procedure Notes (Signed)
Procedure Name: Intubation Date/Time: 08/07/2019 1:22 PM Performed by: West Pugh, CRNA Pre-anesthesia Checklist: Patient identified, Emergency Drugs available, Suction available, Patient being monitored and Timeout performed Patient Re-evaluated:Patient Re-evaluated prior to induction Oxygen Delivery Method: Circle system utilized Preoxygenation: Pre-oxygenation with 100% oxygen Induction Type: IV induction and Rapid sequence Laryngoscope Size: Mac and 3 Grade View: Grade I Tube type: Oral Tube size: 7.0 mm Number of attempts: 1 Airway Equipment and Method: Stylet Placement Confirmation: ETT inserted through vocal cords under direct vision,  positive ETCO2,  CO2 detector and breath sounds checked- equal and bilateral Secured at: 22 cm Tube secured with: Tape Dental Injury: Teeth and Oropharynx as per pre-operative assessment

## 2019-08-07 NOTE — Anesthesia Postprocedure Evaluation (Signed)
Anesthesia Post Note  Patient: Kim Hicks  Procedure(s) Performed: ENDOSCOPIC RETROGRADE CHOLANGIOPANCREATOGRAPHY (ERCP) (N/A ) BILIARY STENT PLACEMENT (N/A ) SPHINCTEROTOMY REMOVAL OF STONES     Patient location during evaluation: PACU Anesthesia Type: General Level of consciousness: awake Pain management: pain level controlled Vital Signs Assessment: post-procedure vital signs reviewed and stable Cardiovascular status: stable Postop Assessment: no apparent nausea or vomiting Anesthetic complications: no    Last Vitals:  Vitals:   08/07/19 1450 08/07/19 1500  BP: (!) 147/60 (!) 145/52  Pulse: (!) 103 (!) 103  Resp: 16 20  Temp:    SpO2: 93% 96%    Last Pain:  Vitals:   08/07/19 1500  TempSrc:   PainSc: 8                  Malani Lees

## 2019-08-07 NOTE — Op Note (Addendum)
[facility] Patient Name: Kim Hicks Procedure Date: 08/07/2019 MRN: [id number] Attending MD: [name] , MD Date of Birth: January 17, 1966 CSN: [id number] Age: 54 Admit Type: Inpatient Procedure:                ERCP Indications:              Bile leak Providers:                Pricilla Riffle. Fuller Plan, MD, Glori Bickers, RN, Lazaro Arms, Technician Referring MD:             Victorino Sparrow. Kae Heller, MD Medicines:                General Anesthesia Complications:            No immediate complications. Estimated Blood Loss:     Estimated blood loss was minimal. Procedure:                Pre-Anesthesia Assessment:                           - Prior to the procedure, a History and Physical                            was performed, and patient medications and                            allergies were reviewed. The patient's tolerance of                            previous anesthesia was also reviewed. The risks                            and benefits of the procedure and the sedation                            options and risks were discussed with the patient.                            All questions were answered, and informed consent                            was obtained. Prior Anticoagulants: The patient has                            taken no previous anticoagulant or antiplatelet                            agents. ASA Grade Assessment: III - A patient with                            severe systemic disease. After reviewing the risks  and benefits, the patient was deemed in                            satisfactory condition to undergo the procedure.                           After obtaining informed consent, the scope was                            passed under direct vision. Throughout the                            procedure, the patient's blood pressure, pulse, and                            oxygen saturations were monitored  continuously. The                            Duodenoscope was introduced through the mouth, and                            used to inject contrast into and used to inject                            contrast into the bile duct. The ERCP was somewhat                            difficult due to challenging cannulation because of                            inadequate scope positioning. Scope in semi-long                            position to visualize ampulla and adequate                            positioning for cannulation was challenging. The                            patient tolerated the procedure well. Scope In: Scope Out: Findings:      A scout film of the abdomen was obtained. Surgical clips, consistent       with a previous cholecystectomy, and a drain were seen in the area of       the right upper quadrant of the abdomen. The esophagus was successfully       intubated under direct vision. The scope was advanced to a normal major       papilla in the descending duodenum without detailed examination of the       pharynx, larynx and associated structures, and upper GI tract. The upper       GI tract was grossly normal. A straight Roadrunner wire was passed into       the biliary tree. The short-nosed traction sphincterotome was passed       over the guidewire and the bile duct was  then deeply cannulated.       Contrast was injected. I personally interpreted the bile duct images.       There was appropriate flow of contrast through the ducts. The common       bile duct contained filling defect(s) thought to be air bubbles.       Extravasation of contrast originating from the area of the cystic duct,       gallbladder bed was observed. A cholecystectomy had been performed. An 8       mm biliary sphincterotomy was made with a traction (standard)       sphincterotome using ERBE electrocautery. The sphincterotomy oozed blood       which stopped spontaneously. The biliary tree was swept  with a 9 mm       balloon starting at the bifurcation. Nothing was found. Occlusion       cholangiogram showed an otherwise normal appearing, nondilated biliary       tree. One 8.5 Fr by 7 cm plastic stent with a single external flap and a       single internal flap was placed 6 cm into the common bile duct. Bile       flowed through the stent. The stent was in good position. The PD was not       cannulated or injected by intention. Impression:               - Bubbles were found in the biliary tract.                           - A bile leak was found in the area of the cystic                            duct, gallbladder bed.                           - Prior cholecystectomy.                           - A biliary sphincterotomy was performed.                           - The biliary tree was swept and nothing was found.                           - One plastic stent was placed into the common bile                            duct. Moderate Sedation:      Not Applicable - Patient had care per Anesthesia. Recommendation:           - Avoid aspirin and nonsteroidal anti-inflammatory                            medicines for 1 week following Indocin today.                           - Avoid anticoagulants for at least 2 days with  mild, self-limited sphincterotomy oozing.                           - Clear liquid diet this afternoon.                           - Return patient to hospital ward for ongoing care.                           - Observe patient's clinical course following                            today's ERCP with therapeutic intervention.                           - Repeat ERCP with stent removal in about 8 weeks. Procedure Code(s):        --- Professional ---                           [phone], Endoscopic retrograde                            cholangiopancreatography (ERCP); with placement of                            endoscopic stent into biliary or pancreatic  duct,                            including pre- and post-dilation and guide wire                            passage, when performed, including sphincterotomy,                            when performed, each stent Diagnosis Code(s):        --- Professional ---                           K83.9, Disease of biliary tract, unspecified                           Z90.49, Acquired absence of other specified parts                            of digestive tract                           K83.8, Other specified diseases of biliary tract                           R93.2, Abnormal findings on diagnostic imaging of                            liver and biliary tract CPT copyright 2019 American Medical Association. All rights reserved. The codes documented in this report are preliminary and upon coder  review may  be revised to meet current compliance requirements. [name], MD 08/07/2019 2:32:46 PM This report has been signed electronically. Number of Addenda: 0

## 2019-08-07 NOTE — Consult Note (Signed)
Consultation  Referring Provider: CCS ConnorPrimary Care Physician:  [name], FNP Primary Gastroenterologist:  Dr. Fuller Plan  Reason for Consultation:   Bile leak   HPI: Kim Hicks is a 54 y.o. female ,, who was admitted on 08/04/2019 with complaints of right upper quadrant pain and nausea x1 month.  She had had outpatient ultrasound done through [facility] in November 2020 which showed multiple gallstones gallbladder wall of 3.4 mm and a CBD of 3 mm, also noted coarsened hepatic  echotexture Labs on 08/01/2019 with normal CBC, LFTs with AST 78 ALT 48 and alk phos 140 3T bili 0.6. Her symptoms were felt consistent with biliary colic.  In the background of that she had also been having significant constipation over the past couple of months. She underwent laparoscopic cholecystectomy on 08/04/2019.  She was noted to have a cirrhotic appearing liver, liver biopsy was not done.  There was noted to be significant fibrosing inflammation on the infundibulum and cystic duct and dissection in that area was difficult. JP was placed which is draining bilious material, about 250 cc last 24 hours  Postoperatively she continued to complain of right upper quadrant pain which has persisted and patient describes as severe. She had HIDA scan done yesterday consistent with bile leak  Today's labs are pending, yesterday WBC 8.3, hemoglobin 9.5, platelets 135 T bili 1.3/alk phos 94/AST 56/ALT 42 INR 1.2 on admit.    Past Medical History:  Diagnosis Date  . Anxiety   . Cataract   . Cirrhosis of liver ([place])   . Depression   . Diabetes mellitus without complication ([place])   . Dysrhythmia   . Edema   . Family history of adverse reaction to anesthesia    mother had trouble waking up after surgery  . Headache    MIGRAINES  . Hyperlipidemia   . Hypertension   . Neuromuscular disorder ([place])    NEUROPATHY-both hands and feet  . Palpitations   . Wheezing     Past Surgical History:  Procedure  Laterality Date  . ABDOMINAL HYSTERECTOMY     one ovary left  . CATARACT EXTRACTION W/PHACO Right 01/11/2018   Procedure: CATARACT EXTRACTION PHACO AND INTRAOCULAR LENS PLACEMENT (IOC);  Surgeon: [name], MD;  Location: [facility] ORS;  Service: Ophthalmology;  Laterality: Right;  [place] 00:57.5 AP% 15.0 CDE 8.61 Fluid Pack Lot # [id number]  . CATARACT EXTRACTION W/PHACO Left 02/15/2018   Procedure: CATARACT EXTRACTION PHACO AND INTRAOCULAR LENS PLACEMENT (IOC);  Surgeon: [name], MD;  Location: [facility] ORS;  Service: Ophthalmology;  Laterality: Left;  [place] 00:51 AP% 13.2 CDE 6.82 Fluid p ack lot # [id number]  . CHOLECYSTECTOMY N/A 08/04/2019   Procedure: LAPAROSCOPIC CHOLECYSTECTOMY;  Surgeon: [name], MD;  Location: [facility] ORS;  Service: General;  Laterality: N/A;  . COLONOSCOPY    . RCR Bilateral     Prior to Admission medications   Medication Sig Start Date End Date Taking? Authorizing Provider  brexpiprazole (REXULTI) 2 MG TABS tablet Take 2 mg by mouth at bedtime.    Yes [provider]  cyclobenzaprine (FLEXERIL) 10 MG tablet Take 10 mg by mouth daily as needed.   Yes [provider]  Empagliflozin-metFORMIN HCl ER (SYNJARDY XR) 25-1000 MG TB24 Take 1 tablet by mouth daily before breakfast.  11/15/18  Yes [provider]  furosemide (LASIX) 20 MG tablet Take 20 mg by mouth daily as needed for fluid. Patient taking every other day   Yes [provider]  gabapentin (NEURONTIN) 400 MG capsule Take 1,200 mg by mouth at bedtime.  02/08/19  Yes [provider]  meclizine (ANTIVERT) 25 MG tablet Take 50 mg by mouth every 6 (six) hours as needed for dizziness.  11/29/18  Yes [provider]  Omega 3 1000 MG CAPS Take 500 mg by mouth in the morning and at bedtime.    Yes [provider]  ondansetron (ZOFRAN) 4 MG tablet Take 4 mg by mouth daily as needed for nausea.  12/02/18  Yes [provider]  prazosin (MINIPRESS) 2  MG capsule Take 4 mg by mouth at bedtime.   Yes [provider]  QUEtiapine (SEROQUEL) 400 MG tablet Take 800 mg by mouth at bedtime.   Yes [provider]  QUEtiapine (SEROQUEL) 50 MG tablet Take 100 mg by mouth daily before breakfast.    Yes [provider]  simvastatin (ZOCOR) 20 MG tablet Take 20 mg by mouth daily before breakfast.  12/13/17  Yes [provider]  TIADYLT ER 120 MG 24 hr capsule Take 120 mg by mouth daily before breakfast.  01/17/19  Yes [provider]  tiZANidine (ZANAFLEX) 4 MG tablet Take 4 mg by mouth 3 (three) times daily.  01/27/19  Yes [provider]  topiramate (TOPAMAX) 100 MG tablet Take 100 mg by mouth daily as needed (Migraines).  02/06/19  Yes [provider]  traMADol ([name]) 50 MG tablet Take 50 mg by mouth daily as needed for moderate pain.  01/13/19  Yes [provider]  TRULICITY 3 ZO/1.0RU SOPN Inject 1.75 mg into the skin once a week. Monday 12/20/18  Yes [provider]  vortioxetine HBr ([name]) 20 MG TABS tablet Take 20 mg by mouth at bedtime.   Yes [provider]  ziprasidone ([name]) 20 MG capsule Take 20 mg by mouth at bedtime.   Yes [provider]  oxyCODONE (ROXICODONE) 5 MG immediate release tablet Take 1 tablet (5 mg total) by mouth every 8 (eight) hours as needed (breakthrough pain only). 08/04/19 08/03/20  [name], MD    Current Facility-Administered Medications  Medication Dose Route Frequency Provider Last Rate Last Admin  . 0.9 %  sodium chloride infusion   Intravenous PRN [name], MD   Stopped at 08/05/19 [phone]  . bisacodyl (DULCOLAX) suppository 10 mg  10 mg Rectal Daily PRN Romana Juniper A, MD      . brexpiprazole (REXULTI) tablet 2 mg  2 mg Oral QHS Romana Juniper A, MD   2 mg at 08/06/19 2146  . diltiazem (CARDIZEM CD) 24 hr capsule 120 mg  120 mg Oral QAC breakfast Romana Juniper A, MD   120 mg at 08/07/19 0981  .  docusate sodium (COLACE) capsule 100 mg  100 mg Oral BID Romana Juniper A, MD   100 mg at 08/07/19 1914  . furosemide (LASIX) tablet 20 mg  20 mg Oral Daily PRN Romana Juniper A, MD      . gabapentin (NEURONTIN) capsule 1,200 mg  1,200 mg Oral QHS Romana Juniper A, MD   1,200 mg at 08/06/19 2147  . hydrALAZINE (APRESOLINE) injection 10 mg  10 mg Intravenous Q2H PRN Romana Juniper A, MD      . HYDROmorphone (DILAUDID) injection 0.5 mg  0.5 mg Intravenous Q4H PRN Romana Juniper A, MD   0.5 mg at 08/04/19 1814  . insulin aspart (novoLOG) injection 0-20 Units  0-20 Units Subcutaneous TID WC [name], MD  3 Units at 08/07/19 0830  . insulin aspart (novoLOG) injection 0-5 Units  0-5 Units Subcutaneous QHS [name], MD   2 Units at 08/06/19 2206  . meclizine (ANTIVERT) tablet 50 mg  50 mg Oral Q6H PRN Romana Juniper A, MD      . metoprolol tartrate (LOPRESSOR) injection 5 mg  5 mg Intravenous Q6H PRN Romana Juniper A, MD      . ondansetron (ZOFRAN-ODT) disintegrating tablet 4 mg  4 mg Oral Q6H PRN [name], MD       Or  . ondansetron [facility]) injection 4 mg  4 mg Intravenous Q6H PRN Romana Juniper A, MD   4 mg at 08/04/19 1312  . oxyCODONE (Oxy IR/ROXICODONE) immediate release tablet 5 mg  5 mg Oral Q4H PRN [name], MD   5 mg at 08/06/19 2155  . pantoprazole (PROTONIX) injection 40 mg  40 mg Intravenous QHS Romana Juniper A, MD   40 mg at 08/06/19 2148  . prazosin (MINIPRESS) capsule 4 mg  4 mg Oral QHS Romana Juniper A, MD   4 mg at 08/06/19 2147  . QUEtiapine (SEROQUEL) tablet 100 mg  100 mg Oral QAC breakfast Romana Juniper A, MD   100 mg at 08/07/19 0831  . QUEtiapine (SEROQUEL) tablet 800 mg  800 mg Oral QHS Romana Juniper A, MD   800 mg at 08/06/19 2148  . tiZANidine (ZANAFLEX) tablet 4 mg  4 mg Oral TID [name], MD   4 mg at 08/07/19 0924  . topiramate (TOPAMAX) tablet 100 mg  100 mg Oral Daily PRN Romana Juniper A, MD      .  vortioxetine HBr ([name]) tablet 20 mg  20 mg Oral QHS Romana Juniper A, MD   20 mg at 08/06/19 2148  . ziprasidone ([name]) capsule 20 mg  20 mg Oral QHS Romana Juniper A, MD   20 mg at 08/06/19 2154    Allergies as of 06/27/2019 - Review Complete 06/21/2019  Allergen Reaction Noted  . Talwin [pentazocine]  02/14/2018    Family History  Problem Relation Age of Onset  . Diabetes Mother   . Heart failure Mother   . Depression Mother     Social History   Socioeconomic History  . Marital status: Married    Spouse name: Not on file  . Number of children: Not on file  . Years of education: Not on file  . Highest education level: Not on file  Occupational History  . Not on file  Tobacco Use  . Smoking status: Never Smoker  . Smokeless tobacco: Never Used  Substance and Sexual Activity  . Alcohol use: Never  . Drug use: Never  . Sexual activity: Not on file  Other Topics Concern  . Not on file  Social History Narrative  . Not on file   Social Determinants of Health   Financial Resource Strain:   . Difficulty of Paying Living Expenses:   Food Insecurity:   . Worried About Charity fundraiser in the Last Year:   . Arboriculturist in the Last Year:   Transportation Needs:   . Film/video editor (Medical):   [name] Lack of Transportation (Non-Medical):   Physical Activity:   . Days of Exercise per Week:   . Minutes of Exercise per Session:   Stress:   . Feeling of Stress :   Social Connections:   . Frequency of Communication with Friends and Family:   .  Frequency of Social Gatherings with Friends and Family:   . Attends Religious Services:   . Active Member of Clubs or Organizations:   . Attends Archivist Meetings:   [name] Marital Status:   Intimate Partner Violence:   . Fear of Current or Ex-Partner:   . Emotionally Abused:   [name] Physically Abused:   . Sexually Abused:     Review of Systems: Gen: Denies any fever, chills, sweats, anorexia, fatigue,  weakness, malaise, weight loss, and sleep disorder CV: Denies chest pain, angina, palpitations, syncope, orthopnea, PND, peripheral edema, and claudication. Resp: Denies dyspnea at rest, dyspnea with exercise, cough, sputum, wheezing, coughing up blood, and pleurisy. GI: Denies vomiting blood, jaundice, and fecal incontinence.   Denies dysphagia or odynophagia. GU : Denies urinary burning, blood in urine, urinary frequency, urinary hesitancy, nocturnal urination, and urinary incontinence. MS: Denies joint pain, limitation of movement, and swelling, stiffness, low back pain, extremity pain. Denies muscle weakness, cramps, atrophy.  Derm: Denies rash, itching, dry skin, hives, moles, warts, or unhealing ulcers.  Psych: Denies depression, anxiety, memory loss, suicidal ideation, hallucinations, paranoia, and confusion. Heme: Denies bruising, bleeding, and enlarged lymph nodes. Neuro:  Denies any headaches, dizziness, paresthesias. Endo:  Denies any problems with DM, thyroid, adrenal function.  Physical Exam: Vital signs in last 24 hours: Temp:  [98.1 F (36.7 C)-99.3 F (37.4 C)] 98.1 F (36.7 C) (03/15 0845) Pulse Rate:  [82-100] 97 (03/15 0845) Resp:  [14-18] 15 (03/15 0526) BP: (109-143)/(62-73) 130/73 (03/15 0845) SpO2:  [94 %-100 %] 100 % (03/15 0845) Last BM Date: 08/03/19 General:   Alert,  Well-developed, well-nourished, white female, pleasant and cooperative in NAD.  Complaining of pain after just ambulating in the hall Head:  Normocephalic and atraumatic. Eyes:  Sclera clear, no icterus.   Conjunctiva pink. Ears:  Normal auditory acuity. Nose:  No deformity, discharge,  or lesions. Mouth:  No deformity or lesions.   Neck:  Supple; no masses or thyromegaly. Lungs:  Clear throughout to auscultation.   No wheezes, crackles, or rhonchi. Heart:  Regular rate and rhythm; no murmurs, clicks, rubs,  or gallops. Abdomen:  Soft, tender across the upper abdomen and right upper quadrant, JP  in place with bilious drainage, BS active,nonpalp mass or hsm.   Rectal:  Deferred  Msk:  Symmetrical without gross deformities. . Pulses:  Normal pulses noted. Extremities:  Without clubbing or edema. Neurologic:  Alert and  oriented x4;  grossly normal neurologically. Skin:  Intact without significant lesions or rashes.. Psych:  Alert and cooperative. Normal mood and affect.  Intake/Output from previous day: 03/14 0701 - 03/15 0700 In: 840 [P.O.:840] Out: 425 [Urine:200; Drains:225] Intake/Output this shift: Total I/O In: 0  Out: 90 [Drains:90]  Lab Results: Recent Labs    08/05/19 0530 08/05/19 1853 08/06/19 0503  WBC 9.9 9.5 8.3  HGB 10.5* 9.7* 9.5*  HCT 35.1* 33.0* 32.3*  PLT 133* 132* 135*   BMET Recent Labs    08/05/19 0530 08/05/19 1853 08/06/19 0503  NA 136 133* 136  K 4.0 4.0 3.8  CL 104 101 103  CO2 17* 20* 20*  GLUCOSE 198* 251* 223*  BUN 16 19 19   CREATININE 0.61 0.65 0.62  CALCIUM 8.3* 8.3* 8.7*   LFT Recent Labs    08/06/19 0503  PROT 6.6  ALBUMIN 3.5  AST 56*  ALT 42  ALKPHOS 94  BILITOT 1.3*   PT/INR Recent Labs    08/05/19 0530  LABPROT 14.9  INR 1.2   Hepatitis Panel No results for input(s): HEPBSAG, HCVAB, HEPAIGM, HEPBIGM in the last 72 hours.      IMPRESSION:  #71 54 year old white female 3 days status post laparoscopic cholecystectomy with bile leak.  #2 cirrhosis noted on prior ultrasound and at surgery-M ELD 8-9 #3 recent severe constipation-recently started on Linzess-recent colonoscopy January 2021 poor prep, repeat colonoscopy in 3 months was planned.  PLAN: #1 patient is scheduled for ERCP with stent placement this afternoon with Dr. Fuller Plan.  Procedure was discussed in detail with the patient including indications risks and benefits and she is agreeable to proceed. IV Unasyn has been started.   Missey Hasley PA-C 08/07/2019, 9:36 AM

## 2019-08-07 NOTE — H&P (View-Only) (Signed)
Consultation  Referring Provider: CCS ConnorPrimary Care Physician:  [name], FNP Primary Gastroenterologist:  Dr. Fuller Plan  Reason for Consultation:   Bile leak   HPI: Kim Hicks is a 54 y.o. female ,, who was admitted on 08/04/2019 with complaints of right upper quadrant pain and nausea x1 month.  She had had outpatient ultrasound done through [facility] in November 2020 which showed multiple gallstones gallbladder wall of 3.4 mm and a CBD of 3 mm, also noted coarsened hepatic  echotexture Labs on 08/01/2019 with normal CBC, LFTs with AST 78 ALT 48 and alk phos 140 3T bili 0.6. Her symptoms were felt consistent with biliary colic.  In the background of that she had also been having significant constipation over the past couple of months. She underwent laparoscopic cholecystectomy on 08/04/2019.  She was noted to have a cirrhotic appearing liver, liver biopsy was not done.  There was noted to be significant fibrosing inflammation on the infundibulum and cystic duct and dissection in that area was difficult. JP was placed which is draining bilious material, about 250 cc last 24 hours  Postoperatively she continued to complain of right upper quadrant pain which has persisted and patient describes as severe. She had HIDA scan done yesterday consistent with bile leak  Today's labs are pending, yesterday WBC 8.3, hemoglobin 9.5, platelets 135 T bili 1.3/alk phos 94/AST 56/ALT 42 INR 1.2 on admit.    Past Medical History:  Diagnosis Date  . Anxiety   . Cataract   . Cirrhosis of liver ([place])   . Depression   . Diabetes mellitus without complication ([place])   . Dysrhythmia   . Edema   . Family history of adverse reaction to anesthesia    mother had trouble waking up after surgery  . Headache    MIGRAINES  . Hyperlipidemia   . Hypertension   . Neuromuscular disorder ([place])    NEUROPATHY-both hands and feet  . Palpitations   . Wheezing     Past Surgical History:  Procedure  Laterality Date  . ABDOMINAL HYSTERECTOMY     one ovary left  . CATARACT EXTRACTION W/PHACO Right 01/11/2018   Procedure: CATARACT EXTRACTION PHACO AND INTRAOCULAR LENS PLACEMENT (IOC);  Surgeon: [name], MD;  Location: [facility] ORS;  Service: Ophthalmology;  Laterality: Right;  [place] 00:57.5 AP% 15.0 CDE 8.61 Fluid Pack Lot # [id number]  . CATARACT EXTRACTION W/PHACO Left 02/15/2018   Procedure: CATARACT EXTRACTION PHACO AND INTRAOCULAR LENS PLACEMENT (IOC);  Surgeon: [name], MD;  Location: [facility] ORS;  Service: Ophthalmology;  Laterality: Left;  [place] 00:51 AP% 13.2 CDE 6.82 Fluid p ack lot # [id number]  . CHOLECYSTECTOMY N/A 08/04/2019   Procedure: LAPAROSCOPIC CHOLECYSTECTOMY;  Surgeon: [name], MD;  Location: [facility] ORS;  Service: General;  Laterality: N/A;  . COLONOSCOPY    . RCR Bilateral     Prior to Admission medications   Medication Sig Start Date End Date Taking? Authorizing Provider  brexpiprazole (REXULTI) 2 MG TABS tablet Take 2 mg by mouth at bedtime.    Yes [provider]  cyclobenzaprine (FLEXERIL) 10 MG tablet Take 10 mg by mouth daily as needed.   Yes [provider]  Empagliflozin-metFORMIN HCl ER (SYNJARDY XR) 25-1000 MG TB24 Take 1 tablet by mouth daily before breakfast.  11/15/18  Yes [provider]  furosemide (LASIX) 20 MG tablet Take 20 mg by mouth daily as needed for fluid. Patient taking every other day   Yes [provider]  gabapentin (NEURONTIN) 400 MG capsule Take 1,200 mg by mouth at bedtime.  02/08/19  Yes [provider]  meclizine (ANTIVERT) 25 MG tablet Take 50 mg by mouth every 6 (six) hours as needed for dizziness.  11/29/18  Yes [provider]  Omega 3 1000 MG CAPS Take 500 mg by mouth in the morning and at bedtime.    Yes [provider]  ondansetron (ZOFRAN) 4 MG tablet Take 4 mg by mouth daily as needed for nausea.  12/02/18  Yes [provider]  prazosin (MINIPRESS) 2  MG capsule Take 4 mg by mouth at bedtime.   Yes [provider]  QUEtiapine (SEROQUEL) 400 MG tablet Take 800 mg by mouth at bedtime.   Yes [provider]  QUEtiapine (SEROQUEL) 50 MG tablet Take 100 mg by mouth daily before breakfast.    Yes [provider]  simvastatin (ZOCOR) 20 MG tablet Take 20 mg by mouth daily before breakfast.  12/13/17  Yes [provider]  TIADYLT ER 120 MG 24 hr capsule Take 120 mg by mouth daily before breakfast.  01/17/19  Yes [provider]  tiZANidine (ZANAFLEX) 4 MG tablet Take 4 mg by mouth 3 (three) times daily.  01/27/19  Yes [provider]  topiramate (TOPAMAX) 100 MG tablet Take 100 mg by mouth daily as needed (Migraines).  02/06/19  Yes [provider]  traMADol ([name]) 50 MG tablet Take 50 mg by mouth daily as needed for moderate pain.  01/13/19  Yes [provider]  TRULICITY 3 XL/2.4MW SOPN Inject 1.75 mg into the skin once a week. Monday 12/20/18  Yes [provider]  vortioxetine HBr ([name]) 20 MG TABS tablet Take 20 mg by mouth at bedtime.   Yes [provider]  ziprasidone ([name]) 20 MG capsule Take 20 mg by mouth at bedtime.   Yes [provider]  oxyCODONE (ROXICODONE) 5 MG immediate release tablet Take 1 tablet (5 mg total) by mouth every 8 (eight) hours as needed (breakthrough pain only). 08/04/19 08/03/20  [name], MD    Current Facility-Administered Medications  Medication Dose Route Frequency Provider Last Rate Last Admin  . 0.9 %  sodium chloride infusion   Intravenous PRN [name], MD   Stopped at 08/05/19 [phone]  . bisacodyl (DULCOLAX) suppository 10 mg  10 mg Rectal Daily PRN Romana Juniper A, MD      . brexpiprazole (REXULTI) tablet 2 mg  2 mg Oral QHS Romana Juniper A, MD   2 mg at 08/06/19 2146  . diltiazem (CARDIZEM CD) 24 hr capsule 120 mg  120 mg Oral QAC breakfast Romana Juniper A, MD   120 mg at 08/07/19 2536  .  docusate sodium (COLACE) capsule 100 mg  100 mg Oral BID Romana Juniper A, MD   100 mg at 08/07/19 6440  . furosemide (LASIX) tablet 20 mg  20 mg Oral Daily PRN Romana Juniper A, MD      . gabapentin (NEURONTIN) capsule 1,200 mg  1,200 mg Oral QHS Romana Juniper A, MD   1,200 mg at 08/06/19 2147  . hydrALAZINE (APRESOLINE) injection 10 mg  10 mg Intravenous Q2H PRN Romana Juniper A, MD      . HYDROmorphone (DILAUDID) injection 0.5 mg  0.5 mg Intravenous Q4H PRN Romana Juniper A, MD   0.5 mg at 08/04/19 1814  . insulin aspart (novoLOG) injection 0-20 Units  0-20 Units Subcutaneous TID WC [name], MD  3 Units at 08/07/19 0830  . insulin aspart (novoLOG) injection 0-5 Units  0-5 Units Subcutaneous QHS [name], MD   2 Units at 08/06/19 2206  . meclizine (ANTIVERT) tablet 50 mg  50 mg Oral Q6H PRN Romana Juniper A, MD      . metoprolol tartrate (LOPRESSOR) injection 5 mg  5 mg Intravenous Q6H PRN Romana Juniper A, MD      . ondansetron (ZOFRAN-ODT) disintegrating tablet 4 mg  4 mg Oral Q6H PRN [name], MD       Or  . ondansetron [facility]) injection 4 mg  4 mg Intravenous Q6H PRN Romana Juniper A, MD   4 mg at 08/04/19 1312  . oxyCODONE (Oxy IR/ROXICODONE) immediate release tablet 5 mg  5 mg Oral Q4H PRN [name], MD   5 mg at 08/06/19 2155  . pantoprazole (PROTONIX) injection 40 mg  40 mg Intravenous QHS Romana Juniper A, MD   40 mg at 08/06/19 2148  . prazosin (MINIPRESS) capsule 4 mg  4 mg Oral QHS Romana Juniper A, MD   4 mg at 08/06/19 2147  . QUEtiapine (SEROQUEL) tablet 100 mg  100 mg Oral QAC breakfast Romana Juniper A, MD   100 mg at 08/07/19 0831  . QUEtiapine (SEROQUEL) tablet 800 mg  800 mg Oral QHS Romana Juniper A, MD   800 mg at 08/06/19 2148  . tiZANidine (ZANAFLEX) tablet 4 mg  4 mg Oral TID [name], MD   4 mg at 08/07/19 0924  . topiramate (TOPAMAX) tablet 100 mg  100 mg Oral Daily PRN Romana Juniper A, MD      .  vortioxetine HBr ([name]) tablet 20 mg  20 mg Oral QHS Romana Juniper A, MD   20 mg at 08/06/19 2148  . ziprasidone ([name]) capsule 20 mg  20 mg Oral QHS Romana Juniper A, MD   20 mg at 08/06/19 2154    Allergies as of 06/27/2019 - Review Complete 06/21/2019  Allergen Reaction Noted  . Talwin [pentazocine]  02/14/2018    Family History  Problem Relation Age of Onset  . Diabetes Mother   . Heart failure Mother   . Depression Mother     Social History   Socioeconomic History  . Marital status: Married    Spouse name: Not on file  . Number of children: Not on file  . Years of education: Not on file  . Highest education level: Not on file  Occupational History  . Not on file  Tobacco Use  . Smoking status: Never Smoker  . Smokeless tobacco: Never Used  Substance and Sexual Activity  . Alcohol use: Never  . Drug use: Never  . Sexual activity: Not on file  Other Topics Concern  . Not on file  Social History Narrative  . Not on file   Social Determinants of Health   Financial Resource Strain:   . Difficulty of Paying Living Expenses:   Food Insecurity:   . Worried About Charity fundraiser in the Last Year:   . Arboriculturist in the Last Year:   Transportation Needs:   . Film/video editor (Medical):   [name] Lack of Transportation (Non-Medical):   Physical Activity:   . Days of Exercise per Week:   . Minutes of Exercise per Session:   Stress:   . Feeling of Stress :   Social Connections:   . Frequency of Communication with Friends and Family:   .  Frequency of Social Gatherings with Friends and Family:   . Attends Religious Services:   . Active Member of Clubs or Organizations:   . Attends Archivist Meetings:   [name] Marital Status:   Intimate Partner Violence:   . Fear of Current or Ex-Partner:   . Emotionally Abused:   [name] Physically Abused:   . Sexually Abused:     Review of Systems: Gen: Denies any fever, chills, sweats, anorexia, fatigue,  weakness, malaise, weight loss, and sleep disorder CV: Denies chest pain, angina, palpitations, syncope, orthopnea, PND, peripheral edema, and claudication. Resp: Denies dyspnea at rest, dyspnea with exercise, cough, sputum, wheezing, coughing up blood, and pleurisy. GI: Denies vomiting blood, jaundice, and fecal incontinence.   Denies dysphagia or odynophagia. GU : Denies urinary burning, blood in urine, urinary frequency, urinary hesitancy, nocturnal urination, and urinary incontinence. MS: Denies joint pain, limitation of movement, and swelling, stiffness, low back pain, extremity pain. Denies muscle weakness, cramps, atrophy.  Derm: Denies rash, itching, dry skin, hives, moles, warts, or unhealing ulcers.  Psych: Denies depression, anxiety, memory loss, suicidal ideation, hallucinations, paranoia, and confusion. Heme: Denies bruising, bleeding, and enlarged lymph nodes. Neuro:  Denies any headaches, dizziness, paresthesias. Endo:  Denies any problems with DM, thyroid, adrenal function.  Physical Exam: Vital signs in last 24 hours: Temp:  [98.1 F (36.7 C)-99.3 F (37.4 C)] 98.1 F (36.7 C) (03/15 0845) Pulse Rate:  [82-100] 97 (03/15 0845) Resp:  [14-18] 15 (03/15 0526) BP: (109-143)/(62-73) 130/73 (03/15 0845) SpO2:  [94 %-100 %] 100 % (03/15 0845) Last BM Date: 08/03/19 General:   Alert,  Well-developed, well-nourished, white female, pleasant and cooperative in NAD.  Complaining of pain after just ambulating in the hall Head:  Normocephalic and atraumatic. Eyes:  Sclera clear, no icterus.   Conjunctiva pink. Ears:  Normal auditory acuity. Nose:  No deformity, discharge,  or lesions. Mouth:  No deformity or lesions.   Neck:  Supple; no masses or thyromegaly. Lungs:  Clear throughout to auscultation.   No wheezes, crackles, or rhonchi. Heart:  Regular rate and rhythm; no murmurs, clicks, rubs,  or gallops. Abdomen:  Soft, tender across the upper abdomen and right upper quadrant, JP  in place with bilious drainage, BS active,nonpalp mass or hsm.   Rectal:  Deferred  Msk:  Symmetrical without gross deformities. . Pulses:  Normal pulses noted. Extremities:  Without clubbing or edema. Neurologic:  Alert and  oriented x4;  grossly normal neurologically. Skin:  Intact without significant lesions or rashes.. Psych:  Alert and cooperative. Normal mood and affect.  Intake/Output from previous day: 03/14 0701 - 03/15 0700 In: 840 [P.O.:840] Out: 425 [Urine:200; Drains:225] Intake/Output this shift: Total I/O In: 0  Out: 90 [Drains:90]  Lab Results: Recent Labs    08/05/19 0530 08/05/19 1853 08/06/19 0503  WBC 9.9 9.5 8.3  HGB 10.5* 9.7* 9.5*  HCT 35.1* 33.0* 32.3*  PLT 133* 132* 135*   BMET Recent Labs    08/05/19 0530 08/05/19 1853 08/06/19 0503  NA 136 133* 136  K 4.0 4.0 3.8  CL 104 101 103  CO2 17* 20* 20*  GLUCOSE 198* 251* 223*  BUN 16 19 19   CREATININE 0.61 0.65 0.62  CALCIUM 8.3* 8.3* 8.7*   LFT Recent Labs    08/06/19 0503  PROT 6.6  ALBUMIN 3.5  AST 56*  ALT 42  ALKPHOS 94  BILITOT 1.3*   PT/INR Recent Labs    08/05/19 0530  LABPROT 14.9  INR 1.2   Hepatitis Panel No results for input(s): HEPBSAG, HCVAB, HEPAIGM, HEPBIGM in the last 72 hours.      IMPRESSION:  #38 54 year old white female 3 days status post laparoscopic cholecystectomy with bile leak.  #2 cirrhosis noted on prior ultrasound and at surgery-M ELD 8-9 #3 recent severe constipation-recently started on Linzess-recent colonoscopy January 2021 poor prep, repeat colonoscopy in 3 months was planned.  PLAN: #1 patient is scheduled for ERCP with stent placement this afternoon with Dr. Fuller Plan.  Procedure was discussed in detail with the patient including indications risks and benefits and she is agreeable to proceed. IV Unasyn has been started.   Lorenzo Pereyra PA-C 08/07/2019, 9:36 AM

## 2019-08-07 NOTE — Progress Notes (Signed)
Patient is NPO, scheduled for surgery in the Am. Patient waiting on MD to explain and obtain consent.

## 2019-08-07 NOTE — Evaluation (Signed)
Physical Therapy Evaluation Patient Details Name: Kim Hicks MRN: [id number] DOB: January 29, 1966 Today's Date: 08/07/2019   History of Present Illness  54 yo female admitted with cirrhosis, abd pain. S/P lap cholecystectomy 3/12. Hx of DM, NM d/o, neuropathy  Clinical Impression  On eval, pt was Min guard assist for mobility. She walked ~225 feet with use of a RW. Pt reported 8/10 abd pain during session. She denied lightheadedness/dizziness. Pt reported she has had issues with dizziness/falls prior to admission. She had a syncopal episode while in the bathroom this admission. Will continue to follow and progress activity as tolerated.     Follow Up Recommendations Supervision for mobility/OOB;Home health PT    Equipment Recommendations  None recommended by PT    Recommendations for Other Services       Precautions / Restrictions Precautions Precautions: Fall Precaution Comments: R jp drain Restrictions Weight Bearing Restrictions: No; post op shoe for walking (L foot)      Mobility  Bed Mobility Overal bed mobility: Needs Assistance Bed Mobility: Supine to Sit;Sit to Supine     Supine to sit: Min guard;HOB elevated Sit to supine: Min guard;HOB elevated   General bed mobility comments: Increased time.  Transfers Overall transfer level: Needs assistance Equipment used: Rolling walker (2 wheeled) Transfers: Sit to/from Stand Sit to Stand: Min guard         General transfer comment: Close guard for safety. Vcs hand placement.  Ambulation/Gait Ambulation/Gait assistance: Min guard Gait Distance (Feet): 225 Feet Assistive device: Rolling walker (2 wheeled) Gait Pattern/deviations: Step-through pattern     General Gait Details: slow but steady gait speed. no lob with RW [place]. pt denied lightheadedness/dizziness  Stairs            Wheelchair Mobility    Modified Rankin (Stroke Patients Only)       Balance Overall balance assessment: History of Falls;Needs  assistance         Standing balance support: Bilateral upper extremity supported Standing balance-Leahy Scale: Fair                               Pertinent Vitals/Pain Pain Assessment: 0-10 Pain Score: 8  Pain Location: abdomen Pain Descriptors / Indicators: Sore;Discomfort;Sharp;Aching Pain Intervention(s): Monitored during session    Home Living Family/patient expects to be discharged to:: Private residence Living Arrangements: Spouse/significant other;Other relatives   Type of Home: House Home Access: Stairs to enter Entrance Stairs-Rails: Right Entrance Stairs-Number of Steps: 8 Home Layout: One level Home Equipment: [place] - 2 wheels;Cane - single point      Prior Function Level of Independence: Independent with assistive device(s)               Hand Dominance        Extremity/Trunk Assessment   Upper Extremity Assessment Upper Extremity Assessment: Overall WFL for tasks assessed    Lower Extremity Assessment Lower Extremity Assessment: Generalized weakness    Cervical / Trunk Assessment Cervical / Trunk Assessment: Normal  Communication   Communication: No difficulties  Cognition Arousal/Alertness: Awake/alert Behavior During Therapy: WFL for tasks assessed/performed Overall Cognitive Status: Within Functional Limits for tasks assessed                                        General Comments      Exercises  Assessment/Plan    PT Assessment Patient needs continued PT services  PT Problem List Decreased strength;Decreased mobility;Decreased activity tolerance;Decreased balance;Pain       PT Treatment Interventions DME instruction;Gait training;Therapeutic activities;Therapeutic exercise;Patient/family education;Balance training;Functional mobility training    PT Goals (Current goals can be found in the Care Plan section)  Acute Rehab PT Goals Patient Stated Goal: less pain PT Goal Formulation: With  patient Time For Goal Achievement: 08/21/19 Potential to Achieve Goals: Good    Frequency Min 3X/week   Barriers to discharge        Co-evaluation               AM-PAC PT "6 Clicks" Mobility  Outcome Measure Help needed turning from your back to your side while in a flat bed without using bedrails?: A Little Help needed moving from lying on your back to sitting on the side of a flat bed without using bedrails?: A Little Help needed moving to and from a bed to a chair (including a wheelchair)?: A Little Help needed standing up from a chair using your arms (e.g., wheelchair or bedside chair)?: A Little Help needed to walk in hospital room?: A Little Help needed climbing 3-5 steps with a railing? : A Little 6 Click Score: 18    End of Session Equipment Utilized During Treatment: Gait belt Activity Tolerance: Patient tolerated treatment well Patient left: in bed;with call bell/phone within reach;with bed alarm set   PT Visit Diagnosis: Unsteadiness on feet (R26.81);Pain;Muscle weakness (generalized) (M62.81) Pain - part of body: (abdomen)    Time: 8841-6606 PT Time Calculation (min) (ACUTE ONLY): 15 min   Charges:   PT Evaluation $PT Eval Low Complexity: 1 Low          Perlita Forbush P, PT Acute Rehabilitation

## 2019-08-07 NOTE — Anesthesia Preprocedure Evaluation (Signed)
Anesthesia Evaluation  Patient identified by MRN, date of birth, ID band  Reviewed: Allergy & Precautions, NPO status , Patient's Chart, lab work & pertinent test results  Airway Mallampati: II  TM Distance: >3 FB     Dental   Pulmonary    breath sounds clear to auscultation       Cardiovascular hypertension, + dysrhythmias  Rhythm:Regular Rate:Normal     Neuro/Psych  Headaches, Anxiety Depression  Neuromuscular disease    GI/Hepatic Neg liver ROS, GERD  ,  Endo/Other  diabetes  Renal/GU negative Renal ROS     Musculoskeletal   Abdominal   Peds  Hematology   Anesthesia Other Findings   Reproductive/Obstetrics                             Anesthesia Physical Anesthesia Plan  ASA: III  Anesthesia Plan: General   Post-op Pain Management:    Induction: Intravenous  PONV Risk Score and Plan: 3 and Ondansetron, Dexamethasone and Midazolam  Airway Management Planned: Oral ETT  Additional Equipment:   Intra-op Plan:   Post-operative Plan: Extubation in OR  Informed Consent: I have reviewed the patients History and Physical, chart, labs and discussed the procedure including the risks, benefits and alternatives for the proposed anesthesia with the patient or authorized representative who has indicated his/her understanding and acceptance.     Dental advisory given  Plan Discussed with: CRNA and Anesthesiologist  Anesthesia Plan Comments:         Anesthesia Quick Evaluation

## 2019-08-07 NOTE — Transfer of Care (Signed)
Immediate Anesthesia Transfer of Care Note  Patient: Kim Hicks  Procedure(s) Performed: ENDOSCOPIC RETROGRADE CHOLANGIOPANCREATOGRAPHY (ERCP) (N/A ) BILIARY STENT PLACEMENT (N/A )  Patient Location: PACU and Endoscopy Unit  Anesthesia Type:General  Level of Consciousness: awake, drowsy and patient cooperative  Airway & Oxygen Therapy: Patient Spontanous Breathing and Patient connected to face mask oxygen  Post-op Assessment: Report given to RN and Post -op Vital signs reviewed and stable  Post vital signs: Reviewed and stable  Last Vitals:  Vitals Value Taken Time  BP 145/63 08/07/19 1433  Temp 36.4 C 08/07/19 1433  Pulse 104 08/07/19 1435  Resp 14 08/07/19 1435  SpO2 100 % 08/07/19 1435  Vitals shown include unvalidated device data.  Last Pain:  Vitals:   08/07/19 1433  TempSrc: Temporal  PainSc: 0-No pain      Patients Stated Pain Goal: 2 (82/70/78 6754)  Complications: No apparent anesthesia complications

## 2019-08-07 NOTE — Addendum Note (Signed)
Addendum  created 08/07/19 1537 by [place] Pugh, CRNA   Flowsheet accepted, Intraprocedure Flowsheets edited

## 2019-08-08 ENCOUNTER — Encounter: Payer: Self-pay | Admitting: *Deleted

## 2019-08-08 DIAGNOSIS — K5909 Other constipation: Secondary | ICD-10-CM

## 2019-08-08 LAB — COMPREHENSIVE METABOLIC PANEL
ALT: 29 U/L (ref 0–44)
AST: 34 U/L (ref 15–41)
Albumin: 3 g/dL — ABNORMAL LOW (ref 3.5–5.0)
Alkaline Phosphatase: 101 U/L (ref 38–126)
Anion gap: 16 — ABNORMAL HIGH (ref 5–15)
BUN: 19 mg/dL (ref 6–20)
CO2: 16 mmol/L — ABNORMAL LOW (ref 22–32)
Calcium: 8.3 mg/dL — ABNORMAL LOW (ref 8.9–10.3)
Chloride: 106 mmol/L (ref 98–111)
Creatinine, Ser: 0.64 mg/dL (ref 0.44–1.00)
GFR calc Af Amer: >60 mL/min (ref >60)
GFR calc non Af Amer: >60 mL/min (ref >60)
Glucose, Bld: 155 mg/dL — ABNORMAL HIGH (ref 70–99)
Potassium: 4 mmol/L (ref 3.5–5.1)
Sodium: 138 mmol/L (ref 135–145)
Total Bilirubin: 1.4 mg/dL — ABNORMAL HIGH (ref 0.3–1.2)
Total Protein: 6.4 g/dL — ABNORMAL LOW (ref 6.5–8.1)

## 2019-08-08 LAB — CBC
HCT: 33 % — ABNORMAL LOW (ref 36.0–46.0)
Hemoglobin: 9.8 g/dL — ABNORMAL LOW (ref 12.0–15.0)
MCH: 26.2 pg (ref 26.0–34.0)
MCHC: 29.7 g/dL — ABNORMAL LOW (ref 30.0–36.0)
MCV: 88.2 fL (ref 80.0–100.0)
Platelets: 162 10*3/uL (ref 150–400)
RBC: 3.74 MIL/uL — ABNORMAL LOW (ref 3.87–5.11)
RDW: 13.8 % (ref 11.5–15.5)
WBC: 6.3 10*3/uL (ref 4.0–10.5)
nRBC: 0 % (ref 0.0–0.2)

## 2019-08-08 LAB — HEPATITIS PANEL, ACUTE
HCV Ab: NONREACTIVE
Hep A IgM: NONREACTIVE
Hep B C IgM: NONREACTIVE
Hepatitis B Surface Ag: NONREACTIVE

## 2019-08-08 LAB — GLUCOSE, CAPILLARY
Glucose-Capillary: 170 mg/dL — ABNORMAL HIGH (ref 70–99)
Glucose-Capillary: 210 mg/dL — ABNORMAL HIGH (ref 70–99)
Glucose-Capillary: 260 mg/dL — ABNORMAL HIGH (ref 70–99)
Glucose-Capillary: 214 mg/dL — ABNORMAL HIGH (ref 70–99)

## 2019-08-08 LAB — MAGNESIUM: Magnesium: 2 mg/dL (ref 1.7–2.4)

## 2019-08-08 MED ORDER — SORBITOL 70 % SOLN
960.0000 mL | TOPICAL_OIL | Freq: Once | ORAL | Status: AC
  Administered 2019-08-08: 960 mL via RECTAL
  Filled 2019-08-08: qty 473

## 2019-08-08 MED ORDER — LINACLOTIDE 145 MCG PO CAPS
145.0000 ug | ORAL_CAPSULE | Freq: Every day | ORAL | Status: DC
  Filled 2019-08-08: qty 1

## 2019-08-08 MED ORDER — OXYCODONE HCL 5 MG PO TABS
5.0000 mg | ORAL_TABLET | Freq: Four times a day (QID) | ORAL | Status: DC | PRN
  Administered 2019-08-08 – 2019-08-10 (×6): 5 mg via ORAL
  Filled 2019-08-08 (×6): qty 1

## 2019-08-08 MED ORDER — POLYETHYLENE GLYCOL 3350 17 G PO PACK
17.0000 g | PACK | Freq: Every day | ORAL | Status: DC
  Administered 2019-08-08 – 2019-08-11 (×4): 17 g via ORAL
  Filled 2019-08-08 (×4): qty 1

## 2019-08-08 MED ORDER — LINACLOTIDE 145 MCG PO CAPS
290.0000 ug | ORAL_CAPSULE | Freq: Every day | ORAL | Status: DC
  Administered 2019-08-08: 145 ug via ORAL
  Administered 2019-08-09 – 2019-08-11 (×3): 290 ug via ORAL
  Filled 2019-08-08 (×4): qty 2

## 2019-08-08 NOTE — Progress Notes (Signed)
S: Continues to have abdominal pain but improving. No bowel movement since 3/11. ERCP yesterday with successful stent placement/ biliary sphincterotomy   Vitals, labs, intake/output, and orders reviewed at this time.  Gen: A&Ox3, no distress  H&N: EOMI, atraumatic, neck supple Chest: unlabored respirations, RRR. Gets to 1250 on IS Abd: obese, soft, appropriately mildly tender, nondistended, incisions c/d/i with dermabond, no cellulitis or hematoma. JP output remains bilious, 330cc last 24h.  Ext: warm, no edema Neuro: grossly normal  Lines/tubes/drains: PIV. Surgical drain.   A/P:  POD 4 s/p laparoscopic cholecystectomy, POD 1 ERCP  -Cirrhosis: [place] Eng A/ MELD 8-9, patient advised of diagnosis and implications -Bile leak: identified at cystic duct/ gallbladder bed; 8.5 Fr by 7 cm plastic stent placed in CBD & sphincterotomy performed. Continue to monitor JP output- hopefully will clear up/ decrease volume over the course of today. Assistance from GI team greatly appreciated.  -Diabetes- continue SSI -Chronic constipation- enema today, resume linzess -Chronic weakness/ neuropathy- walked with PT yesterday. Continue mobilizing to stave off further deconditioning -Advance to carb modified diet today   Continue supportive care and close surgical observation.   Romana Juniper, MD [facility], [place] Pager [phone]

## 2019-08-08 NOTE — Progress Notes (Signed)
Patient ID: BARBY COLVARD, female   DOB: [birth year], 54 y.o.   MRN: [id number]    Progress Note   Subjective   Day # 4  CC; Bile leak  Status post ERCP with stent placement yesterday   WBC 6.3, hemoglobin 9.8 T bili 1.4/AST 34/ALT 29/alk phos 101  JP output remains bilious-330 cc last 24 hours  Patient says she is feeling better today, less abdominal pain.  Still not able to eat much but trying small amounts of solid foods    Objective   Vital signs in last 24 hours: Temp:  [97.5 F (36.4 C)-98.7 F (37.1 C)] 97.7 F (36.5 C) (03/16 0545) Pulse Rate:  [84-105] 96 (03/16 0545) Resp:  [14-22] 18 (03/16 0545) BP: (116-153)/(52-77) 130/72 (03/16 0545) SpO2:  [93 %-100 %] 100 % (03/16 0545) Last BM Date: 08/03/19 General:    white female in NAD, husband at bedside Heart:  Regular rate and rhythm; no murmurs Lungs: Respirations even and unlabored, lungs CTA bilaterally Abdomen:  Soft,  nondistended,, remains tender in the right upper quadrant, no rebound, normal bowel sounds.  JP with a small amount of golden bile Extremities:  Without edema. Neurologic:  Alert and oriented,  grossly normal neurologically. Psych:  Cooperative. Normal mood and affect.  Intake/Output from previous day: 03/15 0701 - 03/16 0700 In: 1914.6 [P.O.:840; I.V.:594.6; IV Piggyback:400.1] Out: 3130 [Urine:2800; Drains:330] Intake/Output this shift: Total I/O In: -  Out: 510 [Urine:500; Drains:10]  Lab Results: Recent Labs    08/05/19 1853 08/06/19 0503 08/08/19 0531  WBC 9.5 8.3 6.3  HGB 9.7* 9.5* 9.8*  HCT 33.0* 32.3* 33.0*  PLT 132* 135* 162   BMET Recent Labs    08/05/19 1853 08/06/19 0503 08/08/19 0531  NA 133* 136 138  K 4.0 3.8 4.0  CL 101 103 106  CO2 20* 20* 16*  GLUCOSE 251* 223* 155*  BUN 19 19 19   CREATININE 0.65 0.62 0.64  CALCIUM 8.3* 8.7* 8.3*   LFT Recent Labs    08/08/19 0531  PROT 6.4*  ALBUMIN 3.0*  AST 34  ALT 29  ALKPHOS 101  BILITOT 1.4*   PT/INR  No results for input(s): LABPROT, INR in the last 72 hours.  Studies/Results: DG ERCP  Result Date: 08/07/2019 CLINICAL DATA:  History of bile leak.  Cholecystectomy. EXAM: ERCP TECHNIQUE: Multiple spot images obtained with the fluoroscopic device and submitted for interpretation post-procedure. FLUOROSCOPY TIME:  Fluoroscopy Time:  4 minutes and 10 seconds Radiation Exposure Index (if provided by the fluoroscopic device): 86.4 mGy COMPARISON:  Nuclear medicine hepatobiliary exam 08/06/2019 FINDINGS: Biliary system was cannulated and a retrograde cholangiogram was performed. Contrast extravasation near the cystic duct clips. Findings are compatible with known bile leak. Minor reflux into the intrahepatic biliary system. No significant biliary dilatation. Placement of a nonmetallic biliary stent in the common bile duct that appears to extend into the intrahepatic biliary system. IMPRESSION: 1. Positive for a bile leak. Contrast extravasation near the cystic duct surgical clips. 2. Placement of biliary stent. These images were submitted for radiologic interpretation only. Please see the procedural report for the amount of contrast and the fluoroscopy time utilized. Electronically Signed   By: Markus Daft M.D.   On: 08/07/2019 15:15   NM HEPATO BILIARY LEAK  Result Date: 08/06/2019 CLINICAL DATA:  54 year old female with RIGHT UPPER quadrant pain following laparoscopic cholecystectomy 2 days ago EXAM: NUCLEAR MEDICINE HEPATOBILIARY IMAGING TECHNIQUE: Sequential images of the abdomen were obtained out to 60 minutes  following intravenous administration of radiopharmaceutical. RADIOPHARMACEUTICALS:  5.4 mCi Tc-34m Choletec IV COMPARISON:  None. FINDINGS: Prompt uptake and biliary excretion of activity by the liver is seen. Biliary activity passes into small bowel, consistent with patent common bile duct. A focal area of persistent increased activity is noted in the expected location of the cholecystectomy bed  compatible with a contained bile leak. No other significant abnormalities noted. IMPRESSION: Focal persistent increased activity at the expected location of the cholecystectomy bed, compatible with a contained bile leak. Electronically Signed   By: JMargarette CanadaM.D.   On: 08/06/2019 17:10       Assessment / Plan:    [id number] old female admitted with biliary colic and underwent laparoscopic cholecystectomy 4 days ago.  Patient continued to have ongoing abdominal pain postoperatively and JP with persistent bilious output  HIDA scan was positive for bile leak and she underwent ERCP with stent placement yesterday.  Pain has improved, JP still putting out bilious fluid-suspect fluid collection/biloma still draining.  Continue to monitor JP output  #2 new finding of cirrhosis-noted at the time of surgery-.  Cirrhosis appears compensated, with no coagulopathy, thrombocytopenia .Patient had ultrasound in 2012 which suggested hepatic steatosis versus possible early cirrhosis.  Suspect Nash. Will send off initial work-up with hepatitis serologies, autoimmune and genetic hepatic markers, and AFP. Eventual EGD for variceal surveillance, this may be able to be scheduled at the same time she has repeat colonoscopy in the near future.  #3 severe constipation-had recently been seen as an outpatient per Dr. SFuller Planwith initiation of Linzess.  Outpatient colonoscopy in January 2021 with poor prep, will need repeat colonoscopy once she recovers from her current illness  Patient to have an enema today for obstipation, will resume Linzess 145 daily  Patient will need to be scheduled for GI office follow-up prior to discharge, with Dr. SFuller Plan  Will need further endoscopic evaluation as above and will also need to be scheduled for stent removal in 4 to 6 weeks.         Active Problems:   Cirrhosis ([place]   Biliary colic   Bile leak   Common bile duct filling defect, non-specific     LOS: 2 days    Amy Esterwood PA-C 08/08/2019, 10:38 AM

## 2019-08-09 ENCOUNTER — Telehealth: Payer: Self-pay

## 2019-08-09 LAB — HEPATITIS B SURFACE ANTIBODY, QUANTITATIVE: Hep B S AB Quant (Post): <3.1 m[IU]/mL — ABNORMAL LOW (ref >9.9)

## 2019-08-09 LAB — COMPREHENSIVE METABOLIC PANEL
ALT: 26 U/L (ref 0–44)
AST: 27 U/L (ref 15–41)
Albumin: 2.9 g/dL — ABNORMAL LOW (ref 3.5–5.0)
Alkaline Phosphatase: 114 U/L (ref 38–126)
Anion gap: 11 (ref 5–15)
BUN: 15 mg/dL (ref 6–20)
CO2: 20 mmol/L — ABNORMAL LOW (ref 22–32)
Calcium: 8.1 mg/dL — ABNORMAL LOW (ref 8.9–10.3)
Chloride: 107 mmol/L (ref 98–111)
Creatinine, Ser: 0.51 mg/dL (ref 0.44–1.00)
GFR calc Af Amer: >60 mL/min (ref >60)
GFR calc non Af Amer: >60 mL/min (ref >60)
Glucose, Bld: 181 mg/dL — ABNORMAL HIGH (ref 70–99)
Potassium: 3.6 mmol/L (ref 3.5–5.1)
Sodium: 138 mmol/L (ref 135–145)
Total Bilirubin: 1.1 mg/dL (ref 0.3–1.2)
Total Protein: 6.1 g/dL — ABNORMAL LOW (ref 6.5–8.1)

## 2019-08-09 LAB — MITOCHONDRIAL ANTIBODIES: Mitochondrial M2 Ab, IgG: <20 Units (ref 0.0–20.0)

## 2019-08-09 LAB — GLUCOSE, CAPILLARY
Glucose-Capillary: 160 mg/dL — ABNORMAL HIGH (ref 70–99)
Glucose-Capillary: 190 mg/dL — ABNORMAL HIGH (ref 70–99)
Glucose-Capillary: 225 mg/dL — ABNORMAL HIGH (ref 70–99)
Glucose-Capillary: 277 mg/dL — ABNORMAL HIGH (ref 70–99)

## 2019-08-09 LAB — ANTI-SMOOTH MUSCLE ANTIBODY, IGG: F-Actin IgG: 1 Units (ref 0–19)

## 2019-08-09 LAB — FERRITIN: Ferritin: 39 ng/mL (ref 11–307)

## 2019-08-09 LAB — CERULOPLASMIN: Ceruloplasmin: 36 mg/dL (ref 19.0–39.0)

## 2019-08-09 LAB — ANTINUCLEAR ANTIBODIES, IFA: ANA Ab, IFA: NEGATIVE

## 2019-08-09 LAB — ALPHA-1-ANTITRYPSIN: A-1 Antitrypsin, Ser: 231 mg/dL — ABNORMAL HIGH (ref 101–187)

## 2019-08-09 MED ORDER — DULAGLUTIDE 3 MG/0.5ML ~~LOC~~ SOAJ: 1.7500 mg | SUBCUTANEOUS | Status: DC

## 2019-08-09 MED ORDER — POTASSIUM CHLORIDE 20 MEQ PO PACK
40.0000 meq | PACK | Freq: Once | ORAL | Status: AC
  Administered 2019-08-09: 40 meq via ORAL
  Filled 2019-08-09: qty 2

## 2019-08-09 NOTE — Progress Notes (Signed)
2 Days Post-Op   Subjective/Chief Complaint: Feeling OK. Ate well yesterday, had a bowel movement.    Objective: Vital signs in last 24 hours: Temp:  [98.1 F (36.7 C)-98.6 F (37 C)] 98.6 F (37 C) (03/17 0601) Pulse Rate:  [86-94] 94 (03/17 0601) Resp:  [16-18] 16 (03/17 0601) BP: (109-122)/(61-70) 109/62 (03/17 0601) SpO2:  [96 %-100 %] 96 % (03/17 0601) Last BM Date: 08/08/19  Intake/Output from previous day: 03/16 0701 - 03/17 0700 In: 1740 [P.O.:1440; IV Piggyback:300] Out: 1895 [Urine:1800; Drains:95] Intake/Output this shift: No intake/output data recorded.  General appearance: alert Cardio: regular rate and rhythm GI: soft, obese, incisions c/d/i without cellulitis or hematoma, minimally tender. JP output remains bilious, but output down to 95cc last 24h  Lab Results:  Recent Labs    08/08/19 0531  WBC 6.3  HGB 9.8*  HCT 33.0*  PLT 162   BMET Recent Labs    08/08/19 0531 08/09/19 0431  NA 138 138  K 4.0 3.6  CL 106 107  CO2 16* 20*  GLUCOSE 155* 181*  BUN 19 15  CREATININE 0.64 0.51  CALCIUM 8.3* 8.1*   PT/INR No results for input(s): LABPROT, INR in the last 72 hours. ABG No results for input(s): PHART, HCO3 in the last 72 hours.  Invalid input(s): PCO2, PO2  Studies/Results: DG ERCP  Result Date: 08/07/2019 CLINICAL DATA:  History of bile leak.  Cholecystectomy. EXAM: ERCP TECHNIQUE: Multiple spot images obtained with the fluoroscopic device and submitted for interpretation post-procedure. FLUOROSCOPY TIME:  Fluoroscopy Time:  4 minutes and 10 seconds Radiation Exposure Index (if provided by the fluoroscopic device): 86.4 mGy COMPARISON:  Nuclear medicine hepatobiliary exam 08/06/2019 FINDINGS: Biliary system was cannulated and a retrograde cholangiogram was performed. Contrast extravasation near the cystic duct clips. Findings are compatible with known bile leak. Minor reflux into the intrahepatic biliary system. No significant biliary  dilatation. Placement of a nonmetallic biliary stent in the common bile duct that appears to extend into the intrahepatic biliary system. IMPRESSION: 1. Positive for a bile leak. Contrast extravasation near the cystic duct surgical clips. 2. Placement of biliary stent. These images were submitted for radiologic interpretation only. Please see the procedural report for the amount of contrast and the fluoroscopy time utilized. Electronically Signed   By: Markus Daft M.D.   On: 08/07/2019 15:15    Anti-infectives: Anti-infectives (From admission, onward)   Start     Dose/Rate Route Frequency Ordered Stop   08/07/19 1000  ampicillin-sulbactam (UNASYN) 1.5 g in sodium chloride 0.9 % 100 mL IVPB     1.5 g 200 mL/hr over 30 Minutes Intravenous Every 6 hours 08/07/19 0939     08/04/19 0730  ceFAZolin (ANCEF) IVPB 2g/100 mL premix     2 g 200 mL/hr over 30 Minutes Intravenous On call to O.R. 08/04/19 [id number] 08/04/19 0911   07/08/19 0930  ceFAZolin (ANCEF) 2 g in dextrose 5 % 100 mL IVPB  Status:  Discontinued     2 g 240 mL/hr over 30 Minutes Intravenous On call to O.R. 07/08/19 [id number] 07/08/19 0918      Assessment/Plan:  A/P:  POD 5 s/p laparoscopic cholecystectomy, POD 2 ERCP  -Cirrhosis: Ardine Eng A/ MELD 8-9, patient advised of diagnosis and implications. Likely NASH, workup for other possible causes pending per GI -Bile leak: identified at cystic duct/ gallbladder bed; 8.5 Fr by 7 cm plastic stent placed in CBD & sphincterotomy performed. Continue to monitor JP output- volume decreased but still bilious.  Assistance from GI team greatly appreciated.  -Diabetes- continue SSI, resume trulicity as blood glucoses have been up -Chronic constipation- continue linzess, reports bowel movement yesterday -Chronic weakness/ neuropathy- Continue PT/  mobilizing to stave off further deconditioning  Continue observation today. If output continues to trend down, potential dc tomorrow. If still bilious but low volume-  defer to GI about utility of additional stent. I think this is still biloma draining and hopefully will seal.     LOS: 3 days    Kim Hicks 08/09/2019

## 2019-08-09 NOTE — Telephone Encounter (Signed)
Called again, still no answer and mail box full

## 2019-08-09 NOTE — Telephone Encounter (Signed)
-----   Message from Levin Erp, [place] sent at 08/09/2019  9:32 AM EDT ----- Regarding: Can you get this pt appt? This patient needs follow up with Amy in the next 3-6 weeks- dx Cirrhosis-  Thank you,JLL

## 2019-08-09 NOTE — Progress Notes (Signed)
Physical Therapy Treatment Patient Details Name: Kim Hicks MRN: [id number] DOB: Sep 30, 1965 Today's Date: 08/09/2019    History of Present Illness 54 yo female admitted with cirrhosis, abd pain. S/P lap cholecystectomy 3/12. Hx of DM, NM d/o, neuropathy    PT Comments    Patient was ambulating throughout room when PT arrived. Admittedly she is supposed to be wearing an ORTHO boot secondary to 2 fractures in her foot- she was not wearing it while walking. She agreed to ex format. Spouse visiting at bedside. Able to perform all LE/UE ex as instructed with good return demo. She relates that she has neuropathies in her feet and hands and when she fractured her foot, she had no idea when she had done this. She remains at risk for falls. Very receptive to PT. Did report some discomfort in abdomen, but that when physician examined her and pressed on her stomach-- it "left it very sore". Should continue to benefit from PT while hospitalized. Could definitely benefit from [facility]. Issued printed ex sheet after good return demo. (pictures and commentary).  Follow Up Recommendations  Supervision for mobility/OOB;Home health PT     Equipment Recommendations  None recommended by PT    Recommendations for Other Services       Precautions / Restrictions Precautions Precautions: Fall Precaution Comments: R jp drain Restrictions Weight Bearing Restrictions: No    Mobility  Bed Mobility Overal bed mobility: Needs Assistance Bed Mobility: Supine to Sit;Sit to Supine     Supine to sit: Min guard;HOB elevated Sit to supine: Min guard;HOB elevated   General bed mobility comments: Increased time.  Transfers Overall transfer level: Needs assistance Equipment used: None(was not using AD when PT in room) Transfers: Sit to/from Stand Sit to Stand: Min guard            Ambulation/Gait Ambulation/Gait assistance: Min Gaffer (Feet): 39 Feet Assistive device: None Gait  Pattern/deviations: Step-through pattern   Gait velocity interpretation: 1.31 - 2.62 ft/sec, indicative of limited community ambulator General Gait Details: slow but steady gait speed. no lob with RW [place]. pt denied lightheadedness/dizziness   Stairs             Wheelchair Mobility    Modified Rankin (Stroke Patients Only)       Balance Overall balance assessment: History of Falls;Needs assistance   Sitting balance-Leahy Scale: Good     Standing balance support: Bilateral upper extremity supported Standing balance-Leahy Scale: Good(F+,G-)                              Cognition Arousal/Alertness: Awake/alert Behavior During Therapy: WFL for tasks assessed/performed Overall Cognitive Status: Within Functional Limits for tasks assessed                                 General Comments: Spouse visiting at bedside. Patient ambulating throughout room with indirect supervision. States that she is prone to falls at home due to neuropathies.      Exercises General Exercises - Lower Extremity Ankle Circles/Pumps: AROM;Seated;10 reps;Both Quad Sets: AROM;Seated;10 reps;Both Gluteal Sets: AROM;Seated;10 reps Short Arc Quad: AROM;Seated;10 reps;Both Heel Slides: AROM;Seated;Both;10 reps Hip ABduction/ADduction: AROM;Seated;10 reps;Both Other Exercises Other Exercises: Printed ex format issued, and included UE shoulder flexion and abduction- Good return demo    General Comments General comments (skin integrity, edema, etc.): Monitor skin and joint integrity - she does  have neuropathies in UE s and feet      Pertinent Vitals/Pain Pain Score: 4  Pain Location: abdomen-- states that when physician examined her and pressed on her abdomen it remained very sore Pain Descriptors / Indicators: Sore;Discomfort;Sharp;Aching    Home Living                      Prior Function            PT Goals (current goals can now be found in the care plan  section) Acute Rehab PT Goals Patient Stated Goal: less pain PT Goal Formulation: With patient Time For Goal Achievement: 08/21/19 Potential to Achieve Goals: Good Progress towards PT goals: Progressing toward goals    Frequency    Min 3X/week      PT Plan      Co-evaluation              AM-PAC PT "6 Clicks" Mobility   Outcome Measure  Help needed turning from your back to your side while in a flat bed without using bedrails?: A Little Help needed moving from lying on your back to sitting on the side of a flat bed without using bedrails?: A Little Help needed moving to and from a bed to a chair (including a wheelchair)?: A Little Help needed standing up from a chair using your arms (e.g., wheelchair or bedside chair)?: A Little Help needed to walk in hospital room?: A Little Help needed climbing 3-5 steps with a railing? : A Little 6 Click Score: 18    End of Session   Activity Tolerance: Patient tolerated treatment well Patient left: Other (comment)(seated on edge of bed continuiing to visit with spouse with PT left room)   PT Visit Diagnosis: Unsteadiness on feet (R26.81);Pain;Muscle weakness (generalized) (M62.81) Pain - part of body: Ankle and joints of foot;Hand(neuropathies)     Time: 1525-1600 PT Time Calculation (min) (ACUTE ONLY): 35 min  Charges:  $Therapeutic Exercise: 23-37 mins                     Rollen Sox, PT # [phone] CGV cell  Casandra Doffing 08/09/2019, 5:04 PM

## 2019-08-09 NOTE — Telephone Encounter (Signed)
Called patient, her only phone # - mail box is full. Will try later

## 2019-08-09 NOTE — Progress Notes (Signed)
Progress Note   Subjective  Day #5 Chief Complaint: Bile leak status post ERCP with stent placement 08/07/2019  Patient tells me that she is feeling fairly well today though the surgeons just "pushed on my gallbladder" and she is now in some pain, denies any nausea or vomiting, able to tolerate her diet.  Tells me that the drainage has slowed, but there was some "cottage cheese" looking material that they had to "milk" down the tube yesterday evening.  JP drain output remains bilious, but output down to 95 cc within the last 24 hours (from 300 cc previously)    Objective   Vital signs in last 24 hours: Temp:  [98.1 F (36.7 C)-98.6 F (37 C)] 98.6 F (37 C) (03/17 0601) Pulse Rate:  [86-94] 94 (03/17 0601) Resp:  [16-18] 16 (03/17 0601) BP: (109-122)/(61-70) 109/62 (03/17 0601) SpO2:  [96 %-100 %] 96 % (03/17 0601) Last BM Date: 08/08/19 General:    white female in NAD Heart:  Regular rate and rhythm; no murmurs Lungs: Respirations even and unlabored, lungs CTA bilaterally Abdomen:  Soft, tender in the right upper quadrant around drain and nondistended. Normal bowel sounds.  JP drain with a small amount of golden bile Extremities:  Without edema. Neurologic:  Alert and oriented,  grossly normal neurologically. Psych:  Cooperative. Normal mood and affect.  Intake/Output from previous day: 03/16 0701 - 03/17 0700 In: 1740 [P.O.:1440; IV Piggyback:300] Out: 1895 [Urine:1800; Drains:95]  Lab Results: Recent Labs    08/08/19 0531  WBC 6.3  HGB 9.8*  HCT 33.0*  PLT 162   BMET Recent Labs    08/08/19 0531 08/09/19 0431  NA 138 138  K 4.0 3.6  CL 106 107  CO2 16* 20*  GLUCOSE 155* 181*  BUN 19 15  CREATININE 0.64 0.51  CALCIUM 8.3* 8.1*   LFT Recent Labs    08/09/19 0431  PROT 6.1*  ALBUMIN 2.9*  AST 27  ALT 26  ALKPHOS 114  BILITOT 1.1   Studies/Results: DG ERCP  Result Date: 08/07/2019 CLINICAL DATA:  History of bile leak.  Cholecystectomy. EXAM:  ERCP TECHNIQUE: Multiple spot images obtained with the fluoroscopic device and submitted for interpretation post-procedure. FLUOROSCOPY TIME:  Fluoroscopy Time:  4 minutes and 10 seconds Radiation Exposure Index (if provided by the fluoroscopic device): 86.4 mGy COMPARISON:  Nuclear medicine hepatobiliary exam 08/06/2019 FINDINGS: Biliary system was cannulated and a retrograde cholangiogram was performed. Contrast extravasation near the cystic duct clips. Findings are compatible with known bile leak. Minor reflux into the intrahepatic biliary system. No significant biliary dilatation. Placement of a nonmetallic biliary stent in the common bile duct that appears to extend into the intrahepatic biliary system. IMPRESSION: 1. Positive for a bile leak. Contrast extravasation near the cystic duct surgical clips. 2. Placement of biliary stent. These images were submitted for radiologic interpretation only. Please see the procedural report for the amount of contrast and the fluoroscopy time utilized. Electronically Signed   By: Markus Daft M.D.   On: 08/07/2019 15:15    Assessment / Plan:   Assessment: 1.  Bile leak: Status post lap cholecystectomy 5 days ago, HIDA scan was positive for bile leak and underwent ERCP 08/07/2019, pain improved, JP drain amount decreasing over the past 24 hours 2.  New finding of cirrhosis: Appears compensated with no coagulopathy or thrombocytopenia, ultrasound 2012 with suggested hepatic steatosis versus possible early cirrhosis, initial work-up has been sent off with hepatitis serologies, autoimmune genetic hepatic markers  and AFP; thought likely NASH 3.  Severe constipation:  Plan: 1.  Patient is improving slowly, surgery is observing. 2.  Work-up for her new cirrhosis has been started in the hospital but will be completed by our service as an outpatient. 3.  I will get her an appointment in our clinic in 3 to 4 weeks to finish work-up for her cirrhosis.  I did discuss this with  her today. 4.  Again patient will need EGD for variceal surveillance in the future, thought to possibly be scheduled the same time she has repeat colonoscopy in the near future.  This will be scheduled outpatient. 5.  Please await final recommendations from Dr. Hilarie Fredrickson later today.  Thank you for kind consultation.  We will sign off.   LOS: 3 days   Levin Erp  08/09/2019, 9:24 AM

## 2019-08-09 NOTE — Care Management Important Message (Signed)
Important Message  Patient Details IM Letter given to [place] Case Manager to present to the Patient Name: Kim Hicks MRN: [id number] Date of Birth: Dec 06, 1965   Medicare Important Message Given:  Yes     Kerin Salen 08/09/2019, 10:36 AM

## 2019-08-10 ENCOUNTER — Telehealth: Payer: Self-pay

## 2019-08-10 LAB — GLUCOSE, CAPILLARY
Glucose-Capillary: 212 mg/dL — ABNORMAL HIGH (ref 70–99)
Glucose-Capillary: 274 mg/dL — ABNORMAL HIGH (ref 70–99)
Glucose-Capillary: 148 mg/dL — ABNORMAL HIGH (ref 70–99)
Glucose-Capillary: 354 mg/dL — ABNORMAL HIGH (ref 70–99)

## 2019-08-10 LAB — AFP TUMOR MARKER: AFP, Serum, Tumor Marker: 1.4 ng/mL (ref 0.0–8.3)

## 2019-08-10 MED ORDER — METRONIDAZOLE 500 MG PO TABS: 500.0000 mg | ORAL_TABLET | Freq: Three times a day (TID) | ORAL | Status: DC

## 2019-08-10 MED ORDER — OXYCODONE HCL 5 MG PO TABS
5.0000 mg | ORAL_TABLET | Freq: Four times a day (QID) | ORAL | Status: DC | PRN
  Administered 2019-08-10 – 2019-08-11 (×3): 10 mg via ORAL
  Filled 2019-08-10 (×3): qty 2

## 2019-08-10 MED ORDER — CIPROFLOXACIN HCL 500 MG PO TABS: 500.0000 mg | ORAL_TABLET | Freq: Two times a day (BID) | ORAL | Status: DC

## 2019-08-10 MED ORDER — LIDOCAINE 5 % EX PTCH
1.0000 | MEDICATED_PATCH | CUTANEOUS | Status: DC
  Administered 2019-08-11: 1 via TRANSDERMAL
  Filled 2019-08-10 (×2): qty 1

## 2019-08-10 MED ORDER — PANTOPRAZOLE SODIUM 40 MG PO TBEC
40.0000 mg | DELAYED_RELEASE_TABLET | Freq: Every day | ORAL | Status: DC
  Administered 2019-08-10: 40 mg via ORAL
  Filled 2019-08-10: qty 1

## 2019-08-10 MED ORDER — AMOXICILLIN-POT CLAVULANATE 875-125 MG PO TABS
1.0000 | ORAL_TABLET | Freq: Two times a day (BID) | ORAL | Status: DC
  Administered 2019-08-10 – 2019-08-11 (×2): 1 via ORAL
  Filled 2019-08-10 (×3): qty 1

## 2019-08-10 NOTE — Progress Notes (Signed)
Physical Therapy Treatment Patient Details Name: Kim Hicks MRN: [id number] DOB: 08-27-1965 Today's Date: 08/10/2019    History of Present Illness 54 yo female admitted with cirrhosis, abd pain. S/P lap cholecystectomy 3/12. Hx of DM, NM d/o, neuropathy    PT Comments    Patient resting in bed when PT arrived. States that her abdomen is still sore, "but maybe a little better than yesterday". Indicates that she had been up to bathroom by herself a few times today. No longer has Purewick. Agreed to ex format. States that her spouse was planning to come visit , having already left their home to come here. He presents as very supportive. She was able to perform log rolling with Min Guard, and use of handrail for supine to sit. Once edge of bed, performed LE ex as outlined- Practiced IS and able to achieve 1250 with good controlled breaths. Should continue to benefit from skilled PT to address optimal functional outcomes.  Follow Up Recommendations  Supervision for mobility/OOB;Home health PT     Equipment Recommendations  None recommended by PT    Recommendations for Other Services       Precautions / Restrictions Precautions Precautions: Fall Precaution Comments: R jp drain Required Braces or Orthoses: Other Brace(ORTHO boot for foot fractures) Restrictions Weight Bearing Restrictions: No    Mobility  Bed Mobility Overal bed mobility: Modified Independent       Supine to sit: Modified independent (Device/Increase time) Sit to supine: Modified independent (Device/Increase time)   General bed mobility comments: Increased time.  Transfers Overall transfer level: Needs assistance Equipment used: None Transfers: Sit to/from Stand Sit to Stand: Min guard;Supervision(Close supervision)            Ambulation/Gait Ambulation/Gait assistance: Min Gaffer (Feet): 22 Feet Assistive device: None Gait Pattern/deviations: Step-through pattern      General Gait Details: slow but steady gait speed. no lob with RW [place]. pt denied lightheadedness/dizziness   Stairs             Wheelchair Mobility    Modified Rankin (Stroke Patients Only)       Balance Overall balance assessment: History of Falls;Needs assistance   Sitting balance-Leahy Scale: Good     Standing balance support: Bilateral upper extremity supported;Single extremity supported Standing balance-Leahy Scale: Good                              Cognition Arousal/Alertness: Awake/alert Behavior During Therapy: WFL for tasks assessed/performed Overall Cognitive Status: Within Functional Limits for tasks assessed                                 General Comments: Spouse in route to visit. She is essentially independent in ambulation- but due to her falls history- needs to be cautious- furniture walker etc- reminded to wear ORTHO boot      Exercises General Exercises - Lower Extremity Ankle Circles/Pumps: AROM;Seated;10 reps;Both Quad Sets: AROM;Seated;10 reps;Both Short Arc Quad: AROM;Seated;10 reps;Both Long Arc Quad: AROM;Seated;10 reps Heel Slides: AROM;Seated;10 reps Hip ABduction/ADduction: AROM;Seated;10 reps Hip Flexion/Marching: AROM;Seated;10 reps Other Exercises Other Exercises: Printed ex format issued, and included UE shoulder flexion and abduction- Good return demo Other Exercises: Practiced IS with good return demo- reminded to use hourly 8-10 breaths    General Comments General comments (skin integrity, edema, etc.): Monitor skin and joint integrity- she does have neuropathies  in UEs and feet      Pertinent Vitals/Pain Pain Score: 3  Pain Location: Abdomen Pain Descriptors / Indicators: Sore;Discomfort;Sharp;[name] to be discharged to:: Private residence                    Prior Function            PT Goals (current goals can now be found in the care plan  section) Acute Rehab PT Goals Patient Stated Goal: less pain PT Goal Formulation: With patient Time For Goal Achievement: 08/21/19 Potential to Achieve Goals: Good Progress towards PT goals: Progressing toward goals    Frequency           PT Plan      Co-evaluation              AM-PAC PT "6 Clicks" Mobility   Outcome Measure  Help needed turning from your back to your side while in a flat bed without using bedrails?: A Little Help needed moving from lying on your back to sitting on the side of a flat bed without using bedrails?: A Little Help needed moving to and from a bed to a chair (including a wheelchair)?: A Little Help needed standing up from a chair using your arms (e.g., wheelchair or bedside chair)?: None Help needed to walk in hospital room?: A Little Help needed climbing 3-5 steps with a railing? : A Little 6 Click Score: 19    End of Session   Activity Tolerance: Patient tolerated treatment well Patient left: Other (comment)(preferred to sit on edge of bed, BS table in reach - awaiting arrival of spouse for visit)   PT Visit Diagnosis: Unsteadiness on feet (R26.81);Pain;Muscle weakness (generalized) (M62.81) Pain - part of body: Ankle and joints of foot;Hand     Time: 0910-0943 PT Time Calculation (min) (ACUTE ONLY): 33 min  Charges:  $Therapeutic Exercise: 23-37 mins                    Rollen Sox, PT # [phone] CGV cell   Casandra Doffing 08/10/2019, 10:51 AM

## 2019-08-10 NOTE — Telephone Encounter (Signed)
-----   Message from [name], MD sent at 08/09/2019 12:45 PM EDT ----- This hospital patient needs REV with me or APP in 2-3 weeks and a repeat ERCP with stent removal in about 8 weeks. Thx.

## 2019-08-10 NOTE — Telephone Encounter (Signed)
Follow up arranged for 08/24/19.  Patient aware of appt date and time.

## 2019-08-10 NOTE — TOC Initial Note (Signed)
Transition of Care [facility]) - Initial/Assessment Note    Patient Details  Name: Kim Hicks MRN: [id number] Date of Birth: [birth year]  Transition of Care [facility]) CM/SW Contact:    Lia Hopping, [place] Phone Number: 08/10/2019, 11:38 AM  Clinical Narrative:                 CSW met the patient bedside to discuss home health options. Patient agreeable to HHPT. She reports this will be her first time having therapy at home. Patient reports she is independent with ADL's, walks with a cane and uses a Rolator.  CSW explain Home Health and provided list of Medicare options and provided choice. Patient chose [place]. CSW reached out [facility] and confirmed staff availability.   Physician please put in [place] orders prior to the patient discharging.    Expected Discharge Plan: [place] Barriers to Discharge: Continued Medical Work up   Patient Goals and CMS Choice   CMS Medicare.gov Compare Post Acute Care list provided to:: Patient Choice offered to / list presented to : Patient  Expected Discharge Plan and Services Expected Discharge Plan: [place] In-house Referral: Clinical Social Work Discharge Planning Services: CM Consult Post Acute Care Choice: [place] arrangements for the past 2 months: Single Family Home                 DME Arranged: N/A DME Agency: NA       HH Arranged: PT [place] Agency: [place] (Reliance) Date [place]: 08/10/19 Time [place]: [id number] Representative spoke with at [place]: Santiago Glad  Prior Living Arrangements/Services Living arrangements for the past 2 months: [place] Lives with:: Spouse Patient language and need for interpreter reviewed:: No Do you feel safe going back to the place where you live?: Yes      Need for Family Participation in Patient Care: Yes (Comment) Care giver support system in place?: Yes (comment) Current home services: DME Criminal  Activity/Legal Involvement Pertinent to Current Situation/Hospitalization: No - Comment as needed  Activities of Daily Living Home Assistive Devices/Equipment: Walker (specify type), Cane (specify quad or straight), Grab bars around toilet, Blood pressure cuff, Eyeglasses, Hand-held shower hose ADL Screening (condition at time of admission) Patient's cognitive ability adequate to safely complete daily activities?: Yes Is the patient deaf or have difficulty hearing?: No Does the patient have difficulty seeing, even when wearing glasses/contacts?: No Does the patient have difficulty concentrating, remembering, or making decisions?: No Patient able to express need for assistance with ADLs?: Yes Does the patient have difficulty dressing or bathing?: No Independently performs ADLs?: Yes (appropriate for developmental age) Does the patient have difficulty walking or climbing stairs?: Yes Weakness of Legs: Right(FEET) Weakness of Arms/Hands: Both  Permission Sought/Granted Permission sought to share information with : Family Supports Permission granted to share information with : Yes, Verbal Permission Granted     Permission granted to share info w AGENCY: [place]        Emotional Assessment Appearance:: Appears stated age   Affect (typically observed): Accepting, Pleasant Orientation: : Oriented to Self, Oriented to Place, Oriented to  Time, Oriented to Situation Alcohol / Substance Use: Not Applicable Psych Involvement: No (comment)  Admission diagnosis:  Cirrhosis ([place]) [L57.26] Biliary colic [O03.55] Patient Active Problem List   Diagnosis Date Noted  . Bile leak   . Common bile duct filling defect, non-specific   . Biliary colic 97/41/6384  .  Cirrhosis ([place]) 08/04/2019  . DYSPHAGIA UNSPECIFIED 03/04/2009  . GERD 02/18/2009  . PERSISTENT VOMITING 02/18/2009  . CHEST PAIN 02/18/2009  . NAUSEA AND VOMITING 02/18/2009  . DEHYDRATION 07/19/2007  . GASTROENTERITIS  07/19/2007  . FIBROIDS, UTERUS 05/02/2007  . DEPRESSION 05/02/2007  . MIGRAINE HEADACHE 05/02/2007  . Essential hypertension 05/02/2007  . HEMORRHOIDS 05/02/2007  . Constipation 05/02/2007  . IBS 05/02/2007  . PSORIASIS 05/02/2007  . PITUITARY NEOPLASM, HX OF 05/02/2007  . LIVER FUNCTION TESTS, ABNORMAL, HX OF 05/02/2007  . DIABETES MELLITUS, TYPE II 02/04/2007  . HYPERLIPIDEMIA 02/04/2007  . SINUSITIS, ACUTE 02/04/2007  . ABDOMINAL PAIN, RIGHT UPPER QUADRANT 02/04/2007   PCP:  [name], FNP Pharmacy:   [id number] [place] [id number]: [phone]: [phone]    Social Determinants of Health (SDOH) Interventions    Readmission Risk Interventions No flowsheet data found.

## 2019-08-10 NOTE — Telephone Encounter (Signed)
Patient called back. She is still in the hospital. Scheduled office visit with Gale Journey PA on 09/07/19

## 2019-08-10 NOTE — Telephone Encounter (Signed)
Called patient, no answer and mail box still full. No MyChart. I will send a letter requesting patient call and schedule an office visit with Nicoletta Ba PA in the next 3-6 weeks

## 2019-08-10 NOTE — Progress Notes (Addendum)
3 Days Post-Op   Subjective/Chief Complaint: Reports stabbing pain in the right flank near the drain site which radiates to her back.  Had nausea and a small amount of emesis yesterday.  I did decrease her oxycodone dose prior to this.   Objective: Vital signs in last 24 hours: Temp:  [97.8 F (36.6 C)-98.6 F (37 C)] 98.2 F (36.8 C) (03/18 0601) Pulse Rate:  [94] 94 (03/18 0601) Resp:  [16-18] 16 (03/18 0601) BP: (118-141)/(73-76) 118/76 (03/18 0601) SpO2:  [96 %-99 %] 96 % (03/18 0601) Last BM Date: 08/08/19  Intake/Output from previous day: 03/17 0701 - 03/18 0700 In: 1086.5 [P.O.:720; IV Piggyback:366.5] Out: 345 [Urine:300; Drains:45] Intake/Output this shift: Total I/O In: 340 [P.O.:240; IV Piggyback:100] Out: 5 [Drains:5]  General appearance: alert Cardio: regular rate and rhythm GI: soft, obese, incisions c/d/i without cellulitis or hematoma, minimally tender. JP output remains light bilious, but output down to 45cc last 24h. Fluid and tubing is thin, does not appear to be clogged  Lab Results:  Recent Labs    08/08/19 0531  WBC 6.3  HGB 9.8*  HCT 33.0*  PLT 162   BMET Recent Labs    08/08/19 0531 08/09/19 0431  NA 138 138  K 4.0 3.6  CL 106 107  CO2 16* 20*  GLUCOSE 155* 181*  BUN 19 15  CREATININE 0.64 0.51  CALCIUM 8.3* 8.1*   PT/INR No results for input(s): LABPROT, INR in the last 72 hours. ABG No results for input(s): PHART, HCO3 in the last 72 hours.  Invalid input(s): PCO2, PO2  Studies/Results: No results found.  Anti-infectives: Anti-infectives (From admission, onward)   Start     Dose/Rate Route Frequency Ordered Stop   08/07/19 1000  ampicillin-sulbactam (UNASYN) 1.5 g in sodium chloride 0.9 % 100 mL IVPB     1.5 g 200 mL/hr over 30 Minutes Intravenous Every 6 hours 08/07/19 0939     08/04/19 0730  ceFAZolin (ANCEF) IVPB 2g/100 mL premix     2 g 200 mL/hr over 30 Minutes Intravenous On call to O.R. 08/04/19 4536 08/04/19  0911   07/08/19 0930  ceFAZolin (ANCEF) 2 g in dextrose 5 % 100 mL IVPB  Status:  Discontinued     2 g 240 mL/hr over 30 Minutes Intravenous On call to O.R. 07/08/19 4680 07/08/19 0918      Assessment/Plan:  A/P:  POD 6 s/p laparoscopic cholecystectomy, POD 3 ERCP  -Cirrhosis: Ardine Eng A/ MELD 8-9, patient advised of diagnosis and implications. Likely NASH, workup for other possible causes pending per GI -Bile leak: identified at cystic duct/ gallbladder bed; 8.5 Fr by 7 cm plastic stent placed in CBD & sphincterotomy performed. Continue to monitor JP output- still slightly bile tinged but volume has decreased significantly. Assistance from GI team greatly appreciated. Change to PO abx today -Diabetes- continue SSI, resumed trulicity 3/21 as blood glucoses have been up -Chronic constipation- continue linzess, reports bowel movement yesterday -Chronic weakness/ neuropathy- Continue PT/  mobilizing to stave off further deconditioning, consultation physicians of care team for home health PT  Objectively things seem to be improving however she notes worsened pain.  The drain appears to be patent and functioning well. Check labs again tomorrow.  If she remains afebrile, labs remained normal and drain output remains consistent she can be discharged tomorrow.    LOS: 4 days    [name] 08/10/2019

## 2019-08-10 NOTE — Progress Notes (Signed)
PHARMACIST - PHYSICIAN COMMUNICATION  DR:   Kae Heller  CONCERNING: IV to Oral Route Change Policy  RECOMMENDATION: This patient is receiving Protonix by the intravenous route.  Based on criteria approved by the Pharmacy and Therapeutics Committee, the intravenous medication(s) is/are being converted to the equivalent oral dose form(s).   DESCRIPTION: These criteria include:  The patient is eating (either orally or via tube) and/or has been taking other orally administered medications for a least 24 hours  The patient has no evidence of active gastrointestinal bleeding or impaired GI absorption (gastrectomy, short bowel, patient on TNA or NPO).  If you have questions about this conversion, please contact the Pharmacy Department  []   [phone] )  [facility] []   [phone] )  [facility] []   [phone] )  [facility] []   [phone] )  [facility] [x]   [phone] )  [place], PharmD, BCPS 08/10/2019 10:54 AM

## 2019-08-10 NOTE — Telephone Encounter (Signed)
-----   Message from Levin Erp, [place] sent at 08/09/2019  9:32 AM EDT ----- Regarding: Can you get this pt appt? This patient needs follow up with Amy in the next 3-6 weeks- dx Cirrhosis-  Thank you,JLL

## 2019-08-11 LAB — COMPREHENSIVE METABOLIC PANEL
ALT: 26 U/L (ref 0–44)
AST: 34 U/L (ref 15–41)
Albumin: 2.9 g/dL — ABNORMAL LOW (ref 3.5–5.0)
Alkaline Phosphatase: 135 U/L — ABNORMAL HIGH (ref 38–126)
Anion gap: 9 (ref 5–15)
BUN: 8 mg/dL (ref 6–20)
CO2: 29 mmol/L (ref 22–32)
Calcium: 8.2 mg/dL — ABNORMAL LOW (ref 8.9–10.3)
Chloride: 104 mmol/L (ref 98–111)
Creatinine, Ser: 0.4 mg/dL — ABNORMAL LOW (ref 0.44–1.00)
GFR calc Af Amer: >60 mL/min (ref >60)
GFR calc non Af Amer: >60 mL/min (ref >60)
Glucose, Bld: 221 mg/dL — ABNORMAL HIGH (ref 70–99)
Potassium: 4 mmol/L (ref 3.5–5.1)
Sodium: 142 mmol/L (ref 135–145)
Total Bilirubin: 0.3 mg/dL (ref 0.3–1.2)
Total Protein: 6.1 g/dL — ABNORMAL LOW (ref 6.5–8.1)

## 2019-08-11 LAB — CBC
HCT: 32.1 % — ABNORMAL LOW (ref 36.0–46.0)
Hemoglobin: 9.8 g/dL — ABNORMAL LOW (ref 12.0–15.0)
MCH: 26.6 pg (ref 26.0–34.0)
MCHC: 30.5 g/dL (ref 30.0–36.0)
MCV: 87.2 fL (ref 80.0–100.0)
Platelets: 168 10*3/uL (ref 150–400)
RBC: 3.68 MIL/uL — ABNORMAL LOW (ref 3.87–5.11)
RDW: 14.1 % (ref 11.5–15.5)
WBC: 5 10*3/uL (ref 4.0–10.5)
nRBC: 0 % (ref 0.0–0.2)

## 2019-08-11 LAB — GLUCOSE, CAPILLARY: Glucose-Capillary: 178 mg/dL — ABNORMAL HIGH (ref 70–99)

## 2019-08-11 LAB — MAGNESIUM: Magnesium: 2.2 mg/dL (ref 1.7–2.4)

## 2019-08-11 MED ORDER — LIDOCAINE 5 % EX PTCH: 1.0000 | MEDICATED_PATCH | CUTANEOUS | 0 refills | Status: DC

## 2019-08-11 MED ORDER — OXYCODONE HCL 5 MG PO TABS: 5.0000 mg | ORAL_TABLET | Freq: Three times a day (TID) | ORAL | 0 refills | Status: DC | PRN

## 2019-08-11 MED ORDER — ONDANSETRON 4 MG PO TBDP: 4.0000 mg | ORAL_TABLET | Freq: Three times a day (TID) | ORAL | 0 refills | Status: DC | PRN

## 2019-08-11 MED ORDER — AMOXICILLIN-POT CLAVULANATE 875-125 MG PO TABS: 1.0000 | ORAL_TABLET | Freq: Two times a day (BID) | ORAL | 0 refills | Status: AC

## 2019-08-11 NOTE — Progress Notes (Signed)
D/C instructions given to patient and husband. Patient or husband had no questions. NT or writer will wheel patient out once family comes to pick her up

## 2019-08-11 NOTE — Progress Notes (Signed)
4 Days Post-Op   Subjective/Chief Complaint: Continued pain at the drain site; no further nausea yesterday. She never got the lidocaine patch   Objective: Vital signs in last 24 hours: Temp:  [97.8 F (36.6 C)-98.6 F (37 C)] 97.8 F (36.6 C) (03/19 0501) Pulse Rate:  [91-98] 92 (03/19 0501) Resp:  [17-18] 18 (03/19 0501) BP: (122-127)/(75-88) 122/78 (03/19 0501) SpO2:  [91 %-100 %] 91 % (03/19 0501) Last BM Date: 08/09/19  Intake/Output from previous day: 03/18 0701 - 03/19 0700 In: 1860 [P.O.:1200; I.V.:560; IV Piggyback:100] Out: 15 [Drains:15] Intake/Output this shift: Total I/O In: 240 [P.O.:240] Out: 0   General appearance: alert Cardio: regular rate and rhythm GI: soft, obese, incisions c/d/i without cellulitis or hematoma, minimally tender. JP output down to 15cc last 24h. Output is serous, only slightly green-tinged; improved. Fluid in tubing is thin, does not appear to be clogged  Lab Results:  Recent Labs    08/11/19 0445  WBC 5.0  HGB 9.8*  HCT 32.1*  PLT 168   BMET Recent Labs    08/09/19 0431 08/11/19 0445  NA 138 142  K 3.6 4.0  CL 107 104  CO2 20* 29  GLUCOSE 181* 221*  BUN 15 8  CREATININE 0.51 0.40*  CALCIUM 8.1* 8.2*   PT/INR No results for input(s): LABPROT, INR in the last 72 hours. ABG No results for input(s): PHART, HCO3 in the last 72 hours.  Invalid input(s): PCO2, PO2  Studies/Results: No results found.  Anti-infectives: Anti-infectives (From admission, onward)   Start     Dose/Rate Route Frequency Ordered Stop   08/10/19 1600  amoxicillin-clavulanate (AUGMENTIN) 875-125 MG per tablet 1 tablet     1 tablet Oral Every 12 hours 08/10/19 1014     08/10/19 1400  metroNIDAZOLE (FLAGYL) tablet 500 mg  Status:  Discontinued     500 mg Oral Every 8 hours 08/10/19 1002 08/10/19 1013   08/10/19 1015  ciprofloxacin (CIPRO) tablet 500 mg  Status:  Discontinued    Note to Pharmacy: Pharmacy may adjust dose as indicated   500 mg  Oral 2 times daily 08/10/19 1002 08/10/19 1013   08/07/19 1000  ampicillin-sulbactam (UNASYN) 1.5 g in sodium chloride 0.9 % 100 mL IVPB  Status:  Discontinued     1.5 g 200 mL/hr over 30 Minutes Intravenous Every 6 hours 08/07/19 0939 08/10/19 1002   08/04/19 0730  ceFAZolin (ANCEF) IVPB 2g/100 mL premix     2 g 200 mL/hr over 30 Minutes Intravenous On call to O.R. 08/04/19 1696 08/04/19 0911   07/08/19 0930  ceFAZolin (ANCEF) 2 g in dextrose 5 % 100 mL IVPB  Status:  Discontinued     2 g 240 mL/hr over 30 Minutes Intravenous On call to O.R. 07/08/19 7893 07/08/19 0918      Assessment/Plan:  A/P:  POD 7 s/p laparoscopic cholecystectomy, POD 4 ERCP  -Cirrhosis: Ardine Eng A/ MELD 8-9, patient advised of diagnosis and implications. Likely NASH, workup for other possible causes pending per GI -Bile leak: identified at cystic duct/ gallbladder bed; 8.5 Fr by 7 cm plastic stent placed in CBD & sphincterotomy performed. Continue to monitor JP output-fluid is clearing up and volume has decreased significantly. Assistance from GI team greatly appreciated. Continue PO abx -Diabetes- continue SSI, resumed trulicity 8/10 as blood glucoses have been up -Chronic constipation- continue linzess, reports bowel movement yesterday -Chronic weakness/ neuropathy- Continue PT/  mobilizing to stave off further deconditioning, consult transition of care team for home  health PT  Continues to do well. Drain output clearing and decreasing, continued pain is secondary to drain site. Labs and vitals remain normal. Will discharge home today with drain, home health PT, and another few days of abx. Follow up with me in 1-2 weeks for drain check. We went over important things to monitor/ call about.  Follow up with GI has been arranged per their office.     LOS: 5 days    [name] 08/11/2019

## 2019-08-11 NOTE — Discharge Summary (Signed)
Physician Discharge Summary  Patient ID: Kim Hicks MRN: [id number] DOB/AGE: 55/06/1965 55 y.o.  Admit date: 08/04/2019 Discharge date: 08/11/2019  Admission Diagnoses:biliary colic  Discharge Diagnoses:  Active Problems:   Cirrhosis ([place])   Bile leak   Discharged Condition: good  Hospital Course: She underwent laparoscopic cholecystectomy on 08/04/19 with intraoperative finding of extremely cirrhotic liver. The case was difficult and ultimately the back wall of the gallbladder was left in place. Post-operatively her surgical drain output was noted to be bile tinged, and HIDA confirmed a contained bile leak. She underwent ERCP with sphincterotomy/stent placement by Dr. Fuller Plan on 08/07/19 and over the ensuing 4 days her drain output became serous and the volume decreased. Her vitals and labs remained within normal limits. Her pain was controlled with oral medications, though some difficulty with drain site. She was deemed stable for discharge home.   Consults: GI  Significant Diagnostic Studies: HIDA, ERCP, labs- see EPIC  Treatments: laparoscopic fenestrated cholecystectomy, ERCP/stent   Discharge Exam: Blood pressure 122/78, pulse 92, temperature 97.8 F (36.6 C), temperature source Oral, resp. rate 18, height 5' 5"  (1.651 m), weight 78 kg, SpO2 91 %. See rounding note  Disposition: Discharge disposition: 01-Home or Self Care       Discharge Instructions     Increase activity slowly   Complete by: As directed       Allergies as of 08/11/2019       Reactions   Talwin [pentazocine]    Turned blue around lips and face, rash all over body        Medication List     TAKE these medications    amoxicillin-clavulanate 875-125 MG tablet Commonly known as: AUGMENTIN Take 1 tablet by mouth every 12 (twelve) hours for 5 days.   cyclobenzaprine 10 MG tablet Commonly known as: FLEXERIL Take 10 mg by mouth daily as needed.   furosemide 20 MG tablet Commonly known as:  LASIX Take 20 mg by mouth daily as needed for fluid. Patient taking every other day   gabapentin 400 MG capsule Commonly known as: NEURONTIN Take 1,200 mg by mouth at bedtime.   lidocaine 5 % Commonly known as: LIDODERM Place 1 patch onto the skin daily. Remove & Discard patch within 12 hours or as directed by MD   meclizine 25 MG tablet Commonly known as: ANTIVERT Take 50 mg by mouth every 6 (six) hours as needed for dizziness.   Omega 3 1000 MG Caps Take 500 mg by mouth in the morning and at bedtime.   ondansetron 4 MG disintegrating tablet Commonly known as: Zofran ODT Take 1 tablet (4 mg total) by mouth every 8 (eight) hours as needed for nausea or vomiting.   ondansetron 4 MG tablet Commonly known as: ZOFRAN Take 4 mg by mouth daily as needed for nausea.   oxyCODONE 5 MG immediate release tablet Commonly known as: Roxicodone Take 1-2 tablets (5-10 mg total) by mouth every 8 (eight) hours as needed (breakthrough pain only).   prazosin 2 MG capsule Commonly known as: MINIPRESS Take 4 mg by mouth at bedtime.   QUEtiapine 400 MG tablet Commonly known as: SEROQUEL Take 800 mg by mouth at bedtime.   QUEtiapine 50 MG tablet Commonly known as: SEROQUEL Take 100 mg by mouth daily before breakfast.   Rexulti 2 MG Tabs tablet Generic drug: brexpiprazole Take 2 mg by mouth at bedtime.   simvastatin 20 MG tablet Commonly known as: ZOCOR Take 20 mg by mouth daily before breakfast.  Synjardy XR 25-1000 MG Tb24 Generic drug: Empagliflozin-metFORMIN HCl ER Take 1 tablet by mouth daily before breakfast.   Tiadylt ER 120 MG 24 hr capsule Generic drug: diltiazem Take 120 mg by mouth daily before breakfast.   tiZANidine 4 MG tablet Commonly known as: ZANAFLEX Take 4 mg by mouth 3 (three) times daily.   topiramate 100 MG tablet Commonly known as: TOPAMAX Take 100 mg by mouth daily as needed (Migraines).   traMADol 50 MG tablet Commonly known as: [name] Take 50 mg  by mouth daily as needed for moderate pain.   [name] 20 MG Tabs tablet Generic drug: vortioxetine HBr Take 20 mg by mouth at bedtime.   Trulicity 3 QG/9.2EF Sopn Generic drug: Dulaglutide Inject 1.75 mg into the skin once a week. Monday   ziprasidone 20 MG capsule Commonly known as: [name] Take 20 mg by mouth at bedtime.       Follow-up Information     [name], MD Follow up in 2 week(s).   Specialty: General Surgery Contact information: [place] [address] [phone]           FOLLOW UP WITH  GI ARRANGED WITH PATIENT PER THEIR OFFICE    Signed: [name] 08/11/2019, 9:30 AM

## 2019-08-11 NOTE — Discharge Instructions (Signed)
LAPAROSCOPIC SURGERY: POST OP INSTRUCTIONS  ######################################################################  EAT Gradually transition to a high fiber diet with a fiber supplement over the next few weeks after discharge.  Start with a pureed / full liquid diet (see below)  WALK Walk an hour a day.  Control your pain to do that.    CONTROL PAIN Control pain so that you can walk, sleep, tolerate sneezing/coughing, go up/down stairs.  HAVE A BOWEL MOVEMENT DAILY Keep your bowels regular to avoid problems.  OK to try a laxative to override constipation.  OK to use an antidairrheal to slow down diarrhea.  Call if not better after 2 tries  CALL IF YOU HAVE PROBLEMS/CONCERNS Call if you are still struggling despite following these instructions. Call if you have concerns not answered by these instructions  ######################################################################    1. DIET: Follow a light bland diet & liquids the first 24 hours after arrival home, such as soup, liquids, starches, etc.  Be sure to drink plenty of fluids.  Quickly advance to a usual solid diet within a few days.  Avoid fast food or heavy meals as your are more likely to get nauseated or have irregular bowels.  A low-sugar, high-fiber diet for the rest of your life is ideal.  2. Take your usually prescribed home medications unless otherwise directed.  3. PAIN CONTROL: a. Pain is best controlled by a usual combination of three different methods TOGETHER: i. Ice/Heat ii. Over the counter pain medication iii. Prescription pain medication b. Most patients will experience some swelling and bruising around the incisions.  Ice packs or heating pads (30-60 minutes up to 6 times a day) will help. Use ice for the first few days to help decrease swelling and bruising, then switch to heat to help relax tight/sore spots and speed recovery.  Some people prefer to use ice alone, heat alone, alternating between ice & heat.   Experiment to what works for you.  Swelling and bruising can take several weeks to resolve.   i. It is helpful to take an over-the-counter pain medication regularly for the first few days (ibuprofen or aleve, and tylenol) c. A  prescription for pain medication (such as oxycodone, hydrocodone, tramadol, gabapentin, methocarbamol, etc) should be given to you upon discharge.  Take your pain medication as prescribed.  i. If you are having problems/concerns with the prescription medicine (does not control pain, nausea, vomiting, rash, itching, etc), please call us [phone] to see if we need to switch you to a different pain medicine that will work better for you and/or control your side effect better. ii. If you need a refill on your pain medication, please give [place] 48 hour notice.  contact your pharmacy.  They will contact our office to request authorization. Prescriptions will not be filled after 5 pm or on week-ends  4. Avoid getting constipated.   a. Between the surgery and the pain medications, it is common to experience some constipation.   b. Increasing fluid intake and taking a fiber supplement (such as Metamucil, Citrucel, FiberCon, MiraLax, etc) 1-2 times a day regularly will usually help prevent this problem from occurring.   c. A mild laxative (prune juice, Milk of Magnesia, MiraLax, etc) should be taken according to package directions if there are no bowel movements after 48 hours.   5. Watch out for diarrhea.   a. If you have many loose bowel movements, simplify your diet to bland foods & liquids for a few days.   b. Stop any  stool softeners and decrease your fiber supplement.   c. Switching to mild anti-diarrheal medications (Kayopectate, Pepto Bismol) can help.   d. If this worsens or does not improve, please call us.  6. Wash / shower every day.  You may shower over the skin glue which is waterproof.  Continue to shower over incision(s) after the dressing is off.  7. Glue will  flake off after about 2 weeks.  You may leave the incision open to air.  You may replace a dressing/Band-Aid to cover the incision for comfort if you wish.   8. ACTIVITIES as tolerated:   a. You may resume regular (light) daily activities beginning the next day--such as daily self-care, walking, climbing stairs--gradually increasing activities as tolerated.  If you can walk 30 minutes without difficulty, it is safe to try more intense activity such as jogging, treadmill, bicycling, low-impact aerobics, swimming, etc. b. Save the most intensive and strenuous activity for last such as sit-ups, heavy lifting, contact sports, etc  Refrain from any heavy lifting or straining until you are off narcotics for pain control.   c. DO NOT PUSH THROUGH PAIN.  Let pain be your guide: If it hurts to do something, don't do it.  Pain is your body warning you to avoid that activity for another week until the pain goes down. d. You may drive when you are no longer taking prescription pain medication, you can comfortably wear a seatbelt, and you can safely maneuver your car and apply brakes. e. Dennis Bast may have sexual intercourse when it is comfortable.  9. FOLLOW UP in our office a. Please call [facility] at [phone] to set up an appointment to see your surgeon in the office for a follow-up appointment approximately 2-3 weeks after your surgery. b. Make sure that you call for this appointment the day you arrive home to insure a convenient appointment time.  10. IF YOU HAVE DISABILITY OR FAMILY LEAVE FORMS, BRING THEM TO THE OFFICE FOR PROCESSING.  DO NOT GIVE THEM TO YOUR DOCTOR.   WHEN TO CALL us [phone]: 1. Poor pain control 2. Reactions / problems with new medications (rash/itching, nausea, etc)  3. Fever over 101.5 F (38.5 C) 4. Inability to urinate 5. Nausea and/or vomiting 6. Worsening swelling or bruising 7. Continued bleeding from incision. 8. Increased pain, redness, or drainage from the  incision 9. Jaundice 10. Significant change in drain output   The clinic staff is available to answer your questions during regular business hours (8:30am-5pm).  Please don't hesitate to call and ask to speak to one of our nurses for clinical concerns.   If you have a medical emergency, go to the nearest emergency room or call 911.  A surgeon from [facility] is always on call at the [facility], [place], [place], [employer], [place]  [id number] ? MAIN: [phone] ? TOLL FREE: [phone] ?  FAX [phone] [email or web address]   [facility] Care Surgical drains are used to remove extra fluid that normally builds up in a surgical wound after surgery. A surgical drain helps to heal a surgical wound. Different kinds of surgical drains include:  Active drains. These drains use suction to pull drainage away from the surgical wound. Drainage flows through a tube to a container outside of the body. With these drains, you need to keep the bulb or the drainage container flat (compressed) at all times, except while you empty it. Flattening  the bulb or container creates suction.  Passive drains. These drains allow fluid to drain naturally, by gravity. Drainage flows through a tube to a bandage (dressing) or a container outside of the body. Passive drains do not need to be emptied. A drain is placed during surgery. Right after surgery, drainage is usually bright red and a little thicker than water. The drainage may gradually turn yellow or pink and become thinner. It is likely that your health care provider will remove the drain when the drainage stops or when the amount decreases to 1-2 Tbsp (15-30 mL) during a 24-hour period. Supplies needed:  Tape.  Germ-free cleaning solution (sterile saline).  Cotton swabs.  Split gauze drain sponge: 4 x 4 inches (10 x 10 cm).  Gauze square: 4 x 4 inches (10 x 10 cm). How to  care for your surgical drain Care for your drain as told by your health care provider. This is important to help prevent infection. If your drain is placed at your back, or any other hard-to-reach area, ask another person to assist you in performing the following tasks: General care  Keep the skin around the drain dry and covered with a dressing at all times.  Check your drain area every day for signs of infection. Check for: ? Redness, swelling, or pain. ? Pus or a bad smell. ? Cloudy drainage. ? Tenderness or pressure at the drain exit site. Changing the dressing Follow instructions from your health care provider about how to change your dressing. Change your dressing at least once a day. Change it more often if needed to keep the dressing dry. Make sure you: 1. Gather your supplies. 2. Wash your hands with soap and water before you change your dressing. If soap and water are not available, use hand sanitizer. 3. Remove the old dressing. Avoid using scissors to do that. 4. Wash your hands with soap and water again after removing the old dressing. 5. Use sterile saline to clean your skin around the drain. You may need to use a cotton swab to clean the skin. 6. Place the tube through the slit in a drain sponge. Place the drain sponge so that it covers your wound. 7. Place the gauze square or another drain sponge on top of the drain sponge that is on the wound. Make sure the tube is between those layers. 8. Tape the dressing to your skin. 9. Tape the drainage tube to your skin 1-2 inches (2.5-5 cm) below the place where the tube enters your body. Taping keeps the tube from pulling on any stitches (sutures) that you have. 10. Wash your hands with soap and water. 11. Write down the color of your drainage and how often you change your dressing. How to empty your active drain  1. Make sure that you have a measuring cup that you can empty your drainage into. 2. Wash your hands with soap and  water. If soap and water are not available, use hand sanitizer. 3. Loosen any pins or clips that hold the tube in place. 4. If your health care provider tells you to strip the tube to prevent clots and tube blockages: ? Hold the tube at the skin with one hand. Use your other hand to pinch the tubing with your thumb and first finger. ? Gently move your fingers down the tube while squeezing very lightly. This clears any drainage, clots, or tissue from the tube. ? You may need to do this several times each day to  keep the tube clear. Do not pull on the tube. 5. Open the bulb cap or the drain plug. Do not touch the inside of the cap or the bottom of the plug. 6. Turn the device upside down and gently squeeze. 7. Empty all of the drainage into the measuring cup. 8. Compress the bulb or the container and replace the cap or the plug. To compress the bulb or the container, squeeze it firmly in the middle while you close the cap or plug the container. 9. Write down the amount of drainage that you have in each 24-hour period. If you have less than 2 Tbsp (30 mL) of drainage during 24 hours, contact your health care provider. 10. Flush the drainage down the toilet. 11. Wash your hands with soap and water. Contact a health care provider if:  You have redness, swelling, or pain around your drain area.  You have pus or a bad smell coming from your drain area.  You have a fever or chills.  The skin around your drain is warm to the touch.  The amount of drainage that you have is increasing instead of decreasing.  You have drainage that is cloudy.  There is a sudden stop or a sudden decrease in the amount of drainage that you have.  Your drain tube falls out.  Your active drain does not stay compressed after you empty it. Summary  Surgical drains are used to remove extra fluid that normally builds up in a surgical wound after surgery.  Different kinds of surgical drains include active drains and  passive drains. Active drains use suction to pull drainage away from the surgical wound, and passive drains allow fluid to drain naturally.  It is important to care for your drain to prevent infection. If your drain is placed at your back, or any other hard-to-reach area, ask another person to assist you.  Contact your health care provider if you have redness, swelling, or pain around your drain area. This information is not intended to replace advice given to you by your health care provider. Make sure you discuss any questions you have with your health care provider. Document Revised: 06/15/2018 Document Reviewed: 06/15/2018 Elsevier Patient Education  2020 [employer].

## 2019-08-16 ENCOUNTER — Ambulatory Visit: Payer: Medicare Other | Admitting: Cardiovascular Disease

## 2019-08-21 ENCOUNTER — Ambulatory Visit (INDEPENDENT_AMBULATORY_CARE_PROVIDER_SITE_OTHER): Payer: Medicare Other | Admitting: Gastroenterology

## 2019-08-21 ENCOUNTER — Other Ambulatory Visit (INDEPENDENT_AMBULATORY_CARE_PROVIDER_SITE_OTHER): Payer: Medicare Other

## 2019-08-21 ENCOUNTER — Encounter: Payer: Self-pay | Admitting: Gastroenterology

## 2019-08-21 VITALS — BP 96/62 | HR 94 | Temp 97.6°F | Ht 65.0 in | Wt 162.0 lb

## 2019-08-21 DIAGNOSIS — K7469 Other cirrhosis of liver: Secondary | ICD-10-CM

## 2019-08-21 DIAGNOSIS — K839 Disease of biliary tract, unspecified: Secondary | ICD-10-CM

## 2019-08-21 DIAGNOSIS — K5909 Other constipation: Secondary | ICD-10-CM

## 2019-08-21 DIAGNOSIS — K7581 Nonalcoholic steatohepatitis (NASH): Secondary | ICD-10-CM

## 2019-08-21 DIAGNOSIS — Z23 Encounter for immunization: Secondary | ICD-10-CM

## 2019-08-21 LAB — HEPATIC FUNCTION PANEL
ALT: 62 U/L — ABNORMAL HIGH (ref 0–35)
AST: 123 U/L — ABNORMAL HIGH (ref 0–37)
Albumin: 3.9 g/dL (ref 3.5–5.2)
Alkaline Phosphatase: 177 U/L — ABNORMAL HIGH (ref 39–117)
Bilirubin, Direct: 0.1 mg/dL (ref 0.0–0.3)
Total Bilirubin: 0.4 mg/dL (ref 0.2–1.2)
Total Protein: 7.2 g/dL (ref 6.0–8.3)

## 2019-08-21 LAB — IBC PANEL
Iron: 35 ug/dL — ABNORMAL LOW (ref 42–145)
Saturation Ratios: 8.3 % — ABNORMAL LOW (ref 20.0–50.0)
Transferrin: 302 mg/dL (ref 212.0–360.0)

## 2019-08-21 LAB — IGA: IgA: 524 mg/dL — ABNORMAL HIGH (ref 68–378)

## 2019-08-21 NOTE — Progress Notes (Signed)
08/21/2019 Kim Hicks [id number] 15-Dec-1965   HISTORY OF PRESENT ILLNESS: This is a pleasant 54 year old female who is a patient of Dr. Lynne Leader.  She underwent laparoscopic cholecystectomy on March 12 as she had an outpatient ultrasound performed at [facility] on November 2020, which showed multiple gallstones, gallbladder wall 3.4 mm, and CBD of 3 mm with also noted coarsened hepatic echotexture.  At the time of her cholecystectomy she was noted to have a cirrhotic appearing liver but liver biopsy was not performed, however.  Subsequently she was found to have a bile leak.  Underwent ERCP with stent placement on March 15 by Dr. Fuller Plan.  Extensive serologic liver evaluation was performed in the hospital and thus far has been negative, suspect NASH as a cause of her liver disease.  She does not drink alcohol.  Colonoscopy January 2020 with poor prep and repeat recommended in 3 months.  She tells me that she has been taking her Linzess 290 mcg daily and it seems to be working well as she has been having bowel movements just about every day.  She complains of pain at her JP site.  She sees surgery later this week.  She tells me that she has only had about a teaspoon of light yellow-colored drainage recently.  Uses lidocaine patch, which does help her pain.  Past Medical History:  Diagnosis Date  . Anxiety   . Cataract   . Cirrhosis of liver ([place])   . Depression   . Diabetes mellitus without complication ([place])   . Dysrhythmia   . Edema   . Family history of adverse reaction to anesthesia    mother had trouble waking up after surgery  . Headache    MIGRAINES  . Hyperlipidemia   . Hypertension   . Neuromuscular disorder ([place])    NEUROPATHY-both hands and feet  . Palpitations   . Wheezing    Past Surgical History:  Procedure Laterality Date  . ABDOMINAL HYSTERECTOMY     one ovary left  . BILIARY STENT PLACEMENT N/A 08/07/2019   Procedure: BILIARY STENT PLACEMENT;  Surgeon: [name], MD;  Location: [facility];  Service: Endoscopy;  Laterality: N/A;  . CATARACT EXTRACTION W/PHACO Right 01/11/2018   Procedure: CATARACT EXTRACTION PHACO AND INTRAOCULAR LENS PLACEMENT (IOC);  Surgeon: [name], MD;  Location: [facility] ORS;  Service: Ophthalmology;  Laterality: Right;  [place] 00:57.5 AP% 15.0 CDE 8.61 Fluid Pack Lot # [id number]  . CATARACT EXTRACTION W/PHACO Left 02/15/2018   Procedure: CATARACT EXTRACTION PHACO AND INTRAOCULAR LENS PLACEMENT (IOC);  Surgeon: [name], MD;  Location: [facility] ORS;  Service: Ophthalmology;  Laterality: Left;  [place] 00:51 AP% 13.2 CDE 6.82 Fluid p ack lot # [id number]  . CHOLECYSTECTOMY N/A 08/04/2019   Procedure: LAPAROSCOPIC CHOLECYSTECTOMY;  Surgeon: [name], MD;  Location: [facility];  Service: General;  Laterality: N/A;  . COLONOSCOPY    . ERCP N/A 08/07/2019   Procedure: ENDOSCOPIC RETROGRADE CHOLANGIOPANCREATOGRAPHY (ERCP);  Surgeon: [name], MD;  Location: [facility];  Service: Endoscopy;  Laterality: N/A;  . RCR Bilateral   . REMOVAL OF STONES  08/07/2019   Procedure: REMOVAL OF STONES;  Surgeon: [name], MD;  Location: [facility];  Service: Endoscopy;;  balloon sweep, no stones  . SPHINCTEROTOMY  08/07/2019   Procedure: SPHINCTEROTOMY;  Surgeon: [name], MD;  Location: [facility];  Service: Endoscopy;;    reports that she has never smoked. She has never used smokeless tobacco.  She reports that she does not drink alcohol or use drugs. family history includes Depression in her mother; Diabetes in her mother; Heart failure in her mother. Allergies  Allergen Reactions  . Talwin [Pentazocine]     Turned blue around lips and face, rash all over body      Outpatient Encounter Medications as of 08/21/2019  Medication Sig  . brexpiprazole (REXULTI) 2 MG TABS tablet Take 2 mg by mouth at bedtime.   . cyclobenzaprine (FLEXERIL) 10 MG tablet Take 10 mg by mouth daily as needed.  .  Empagliflozin-metFORMIN HCl ER (SYNJARDY XR) 25-1000 MG TB24 Take 1 tablet by mouth daily before breakfast.   . furosemide (LASIX) 20 MG tablet Take 20 mg by mouth daily as needed for fluid. Patient taking every other day  . gabapentin (NEURONTIN) 400 MG capsule Take 1,200 mg by mouth at bedtime.   . lidocaine (LIDODERM) 5 % Place 1 patch onto the skin daily. Remove & Discard patch within 12 hours or as directed by MD  . meclizine (ANTIVERT) 25 MG tablet Take 50 mg by mouth every 6 (six) hours as needed for dizziness.   . Omega 3 1000 MG CAPS Take 500 mg by mouth in the morning and at bedtime.   . ondansetron (ZOFRAN ODT) 4 MG disintegrating tablet Take 1 tablet (4 mg total) by mouth every 8 (eight) hours as needed for nausea or vomiting.  . ondansetron (ZOFRAN) 4 MG tablet Take 4 mg by mouth daily as needed for nausea.   [name] oxyCODONE (ROXICODONE) 5 MG immediate release tablet Take 1-2 tablets (5-10 mg total) by mouth every 8 (eight) hours as needed (breakthrough pain only).  . prazosin (MINIPRESS) 2 MG capsule Take 4 mg by mouth at bedtime.  [name] QUEtiapine (SEROQUEL) 400 MG tablet Take 800 mg by mouth at bedtime.  [name] QUEtiapine (SEROQUEL) 50 MG tablet Take 100 mg by mouth daily before breakfast.   . simvastatin (ZOCOR) 20 MG tablet Take 20 mg by mouth daily before breakfast.   . TIADYLT ER 120 MG 24 hr capsule Take 120 mg by mouth daily before breakfast.   . tiZANidine (ZANAFLEX) 4 MG tablet Take 4 mg by mouth 3 (three) times daily.   [name] topiramate (TOPAMAX) 100 MG tablet Take 100 mg by mouth daily as needed (Migraines).   . traMADol ([name]) 50 MG tablet Take 50 mg by mouth daily as needed for moderate pain.   . TRULICITY 3 IF/0.2DX SOPN Inject 1.75 mg into the skin once a week. Monday  . vortioxetine HBr ([name]) 20 MG TABS tablet Take 20 mg by mouth at bedtime.  . ziprasidone ([name]) 20 MG capsule Take 20 mg by mouth at bedtime.   No facility-administered encounter medications on file as of  08/21/2019.     REVIEW OF SYSTEMS  : All other systems reviewed and negative except where noted in the History of Present Illness.   PHYSICAL EXAM: BP 96/62   Pulse 94   Temp 97.6 F (36.4 C)   Ht 5' 5"  (1.651 m)   Wt 162 lb (73.5 kg)   BMI 26.96 kg/m  General: Well developed white female in no acute distress Head: Normocephalic and atraumatic Eyes:  Sclerae anicteric, conjunctiva pink. Ears: Normal auditory acuity Lungs: Clear throughout to auscultation; no increased WOB. Heart: Regular rate and rhythm; no M/R/G. Abdomen: Soft, non-distended.  BS present.  Non-tender except near her JP drain.  Site looks slightly red and irritated.  Minute amount of light  yellow fluid in bulb. Musculoskeletal: Symmetrical with no gross deformities  Skin: No lesions on visible extremities Extremities: No edema  Neurological: Alert oriented x 4, grossly non-focal Psychological:  Alert and cooperative. Normal mood and affect  ASSESSMENT AND PLAN: 1.  Bile leak:  Status post lap cholecystectomy on 3/12, HIDA scan was positive for bile leak and underwent ERCP with stent on 08/07/2019.  JP drain is still in place. 2.  New finding of cirrhosis: Appears compensated with no coagulopathy or thrombocytopenia, ultrasound 2012 with suggested hepatic steatosis versus possible early cirrhosis then confirmed on more recent ultrasound at [facility] and at the time of surgery.  Extensive evaluation negative thus far, suspect NASH.   3.  Severe constipation:  Moving her bowel regularly on Linzess 290 mcg daily.  Will continue.  *Needs repeat ERCP with stent removal 8 to 12 weeks out from initial procedure.  We will schedule that with Dr. Fuller Plan.  The risks, benefits, and alternatives to ERCP were discussed with the patient and shes consents to proceed.  *Will begin hepatitis A and B vaccinations as patient does not have positive antibodies. *We will need repeat imaging in the form of ultrasound for [facility] surveillance in  May. *We will repeat LFTs today along with celiac labs and iron studies. *We will need repeat colonoscopy later this year, but will let her recover from this biliary situation for now.   CC:  [name], FNP

## 2019-08-21 NOTE — Patient Instructions (Addendum)
If you are age 55 or older, your body mass index should be between 23-30. Your Body mass index is 26.96 kg/m. If this is out of the aforementioned range listed, please consider follow up with your Primary Care Provider.  If you are age 56 or younger, your body mass index should be between 19-25. Your Body mass index is 26.96 kg/m. If this is out of the aformentioned range listed, please consider follow up with your Primary Care Provider.    Your provider has requested that you go to the basement level for lab work before leaving today. Press "B" on the elevator. The lab is located at the first door on the left as you exit the elevator.  You have been scheduled for an abdominal ultrasound at [facility] 10/02/2019 at 10:00 . Please arrive 15 minutes prior to your appointment for registration. Make certain not to have anything to eat or drink 6 hours prior to your appointment. Should you need to reschedule your appointment, please contact radiology at [phone] This test typically takes about 30 minutes to perform.     Due to recent changes in healthcare laws, you may see the results of your imaging and laboratory studies on MyChart before your provider has had a chance to review them.  We understand that in some cases there may be results that are confusing or concerning to you. Not all laboratory results come back in the same time frame and the provider may be waiting for multiple results in order to interpret others.  Please give [place] 48 hours in order for your provider to thoroughly review all the results before contacting the office for clarification of your results.

## 2019-08-21 NOTE — Progress Notes (Signed)
Reviewed and agree with management plan.  Patrick Salemi T. Mylez Venable, MD FACG [facility]  

## 2019-08-22 ENCOUNTER — Telehealth: Payer: Self-pay | Admitting: General Surgery

## 2019-08-22 LAB — TISSUE TRANSGLUTAMINASE, IGA: (tTG) Ab, IgA: 1 U/mL

## 2019-08-22 NOTE — Telephone Encounter (Signed)
-----   Message from Loralie Champagne, PA-C sent at 08/21/2019  4:56 PM EDT ----- Per Dr. Fuller Plan... ----- Message ----- From: [name], MD Sent: 08/21/2019   3:03 PM EDT To: Loralie Champagne, PA-C  Hi, [name] schedule in June. She needs follow up with [facility] and they will direct her drain removal prior to ERCP. Thx. MS ----- Message ----- From: Loralie Champagne, PA-C Sent: 08/21/2019   2:38 PM EDT To: [name], MD  Hello.  I just wanted to check on timing of her repeat ERCP.  8 weeks would be the week of May 10, but you do not have anything available except for May 4, which is only at the 7-week mark.  Did not know if that was okay to proceed or if we should postpone till June?  Thank you,  [name]

## 2019-08-22 NOTE — Telephone Encounter (Signed)
Spoke with Delilah Shan at [facility]- procedure cancelled

## 2019-08-22 NOTE — Telephone Encounter (Signed)
Notified the patient that we are cancelling her ERCP until June. She will need to follow up with [facility] for her drain.

## 2019-08-23 ENCOUNTER — Encounter: Payer: Medicare Other | Admitting: Gastroenterology

## 2019-08-24 ENCOUNTER — Ambulatory Visit: Payer: Medicare Other | Admitting: Gastroenterology

## 2019-08-28 ENCOUNTER — Ambulatory Visit (INDEPENDENT_AMBULATORY_CARE_PROVIDER_SITE_OTHER): Payer: Medicare Other | Admitting: Gastroenterology

## 2019-08-28 DIAGNOSIS — Z23 Encounter for immunization: Secondary | ICD-10-CM | POA: Diagnosis not present

## 2019-08-31 ENCOUNTER — Telehealth: Payer: Self-pay

## 2019-08-31 NOTE — Telephone Encounter (Signed)
-----   Message from Loralie Champagne, PA-C sent at 08/31/2019  8:34 AM EDT ----- She has not tried any oral iron.  If it will not be approved then we can just have her try ferrous sulfate 325 mg once daily to start.  Please advise her that it may constipate her so to be sure that she is taking a stool softener if needed and that it can also make the stools darkish green in color.  Thank you,  [name] ----- Message ----- From: Timothy Lasso, RN Sent: 08/28/2019   4:19 PM EDT To: Loralie Champagne, PA-C  [facility] needs to know if the pt has tried any oral iron prior to setting up IV iron.  Please advise.  I do not see anything in the note.

## 2019-08-31 NOTE — Telephone Encounter (Signed)
Sounds good .  Thank you.  I did not look into all of that other stuff again about the constipation.  Sorry.

## 2019-08-31 NOTE — Telephone Encounter (Signed)
Janett Billow I spoke with the pt and she tells me that [facility] is trying to get IV iron approved.  She states she is already constipated on 290 mcg of Linzess and does not want to try oral iron.  She has advised [facility] of this and they are using that to try and get the IV iron approved.  She will call once she gets an answer from [place].

## 2019-09-04 ENCOUNTER — Telehealth: Payer: Self-pay | Admitting: Gastroenterology

## 2019-09-04 NOTE — Telephone Encounter (Signed)
Patient wanted to know if she can start on medication to prevent the progression of cirrhosis.  We discussed that there is no medication to specifically tx the cirrhosis.  All of her questions were answered. She will call back for additional questions or concerns.

## 2019-09-06 ENCOUNTER — Telehealth: Payer: Self-pay | Admitting: Gastroenterology

## 2019-09-06 NOTE — Telephone Encounter (Signed)
Letter faxed per request

## 2019-09-07 ENCOUNTER — Telehealth: Payer: Self-pay

## 2019-09-07 ENCOUNTER — Ambulatory Visit: Payer: Medicare Other | Admitting: Physician Assistant

## 2019-09-07 NOTE — Telephone Encounter (Signed)
-----   Message from Loralie Champagne, PA-C sent at 09/07/2019  1:13 PM EDT ----- See message from Dr. Fuller Plan.  Needs ERCP with stent removal. ----- Message ----- From: [name], MD Sent: 09/07/2019  12:36 PM EDT To: Loralie Champagne, PA-C  [name], Please schedule this ERCP on my [facility] outpatient day on June 15.  Thanks, MS ----- Message ----- From: [name], MD Sent: 08/21/2019   3:03 PM EDT To: Loralie Champagne, PA-C  Hi, [name] schedule in June. She needs follow up with [facility] and they will direct her drain removal prior to ERCP. Thx. MS ----- Message ----- From: Loralie Champagne, PA-C Sent: 08/21/2019   2:38 PM EDT To: [name], MD  Hello.  I just wanted to check on timing of her repeat ERCP.  8 weeks would be the week of May 10, but you do not have anything available except for May 4, which is only at the 7-week mark.  Did not know if that was okay to proceed or if we should postpone till June?  Thank you,  [name]

## 2019-09-08 ENCOUNTER — Other Ambulatory Visit: Payer: Self-pay

## 2019-09-08 DIAGNOSIS — Z4689 Encounter for fitting and adjustment of other specified devices: Secondary | ICD-10-CM

## 2019-09-08 NOTE — Telephone Encounter (Signed)
The pt has been scheduled for ERCP on 6/15 per order.  COVID testing on 6/11.  Pt has been advised and all information mailed to the pt home

## 2019-09-12 ENCOUNTER — Other Ambulatory Visit: Payer: Self-pay | Admitting: General Surgery

## 2019-09-14 ENCOUNTER — Telehealth: Payer: Self-pay | Admitting: Neurology

## 2019-09-14 ENCOUNTER — Ambulatory Visit (INDEPENDENT_AMBULATORY_CARE_PROVIDER_SITE_OTHER): Payer: Medicare Other | Admitting: Neurology

## 2019-09-14 ENCOUNTER — Other Ambulatory Visit: Payer: Self-pay

## 2019-09-14 ENCOUNTER — Encounter: Payer: Self-pay | Admitting: Neurology

## 2019-09-14 DIAGNOSIS — R269 Unspecified abnormalities of gait and mobility: Secondary | ICD-10-CM

## 2019-09-14 DIAGNOSIS — E1142 Type 2 diabetes mellitus with diabetic polyneuropathy: Secondary | ICD-10-CM

## 2019-09-14 MED ORDER — PREGABALIN 100 MG PO CAPS: 100.0000 mg | ORAL_CAPSULE | Freq: Three times a day (TID) | ORAL | 3 refills | Status: DC

## 2019-09-14 NOTE — Telephone Encounter (Addendum)
Called [facility], spoke with Joe and asked if they can run Rx for pregabalin he stated they did, and it requires PA. Attempted PA on CMM, eligibility not found with insurance information on her EMR, [employer]. Called patient and VM requesting she call back Mon to give correct insurance information, advised office is closed on Fridays.

## 2019-09-14 NOTE — Patient Instructions (Signed)
Stop gabapentin  Start Lyrica 100 mg 3 times daily to see if you get better pain control  See you back in 4 months

## 2019-09-14 NOTE — Telephone Encounter (Signed)
Pt states she has been told by her Pharmacy that a prior Auth is needed for her pregabalin (LYRICA) 100 MG capsule please call

## 2019-09-14 NOTE — Progress Notes (Addendum)
PATIENT: Kim Hicks DOB: August 19, 1965  REASON FOR VISIT: follow up HISTORY FROM: patient  HISTORY OF PRESENT ILLNESS: Today 09/14/19  Kim Hicks is a 54 year old female with history of diabetic peripheral neuropathy and gait disturbance.  She had a laparoscopic cholecystectomy in March 2020, the liver was found to be extremely cirrhotic, she says only half the gallbladder was removed because it was connected to the liver. She is having ERCP in June.  Extensive laboratory evaluation by Dr. Jannifer Franklin in December (B12, sed rate, angiotensin-converting enzyme, rheumatoid factor, ANA, B burgdorferi, IFE and PE) were normal, no other cause of her neuropathy.  For her cirrhosis, she is getting iron infusions, taking hepatitis shots.  For neuropathy, complains of constant throbbing in her hands and feet, is worse at night.  She has gait instability, balance issues.  She fell, has a fracture to her left foot, wearing a boot.  She has weakness in her hands, trouble using utensils, dropping things, feels numbness in her fingertips.  Sometimes, the throbbing pain is so bad, it brings her to tears.  The gabapentin is not working.  She has tramadol she takes rarely for shoulder pain.  She is taking gabapentin 400 mg 3 times a day.  She is using a cane.  She tried physical therapy, but could not do it because her neuropathy pain was so intense.  She presents today for evaluation unaccompanied.  HISTORY 05/16/2019 Dr. Jannifer Franklin: Kim Hicks is a 54 year old right-handed white female with a 62 or 16-year history of diabetes.  The patient has at least an 79-monthhistory of some sensation of numbness in the hands and feet with some weakness in the hands.  The patient has noted a change in balance, she will stumble or fall on occasion.  She did fall this morning before the office visit.  The patient is now using a cane for ambulation of the last 2 months to help with walking safety.  She reports some discomfort in the feet, she  does not sleep well at night often times because of this.  She currently is on gabapentin taking 400 mg 3 times daily.  The patient reports some neck and shoulder discomfort but denies any radicular pain down the arms on either side.  She denies any low back pain or difficulty controlling the bowels or the bladder.  She is followed through Dr. FDomingo Cockingfor headaches, she apparently had nerve conduction studies done recently on arms and legs that showed evidence of a relatively severe sensorimotor axonal and demyelinating neuropathy.  The distal motor latencies for the peroneal and posterior tibial nerves were normal bilaterally.  The sural sensory responses were prolonged with reduced amplitudes and the saphenous sensory responses were absent.  In the arms, the patient has bilateral distal median and ulnar involvement.  EMG evaluation apparently on the arms were normal EMG on the lower extremities were also normal with exception of some neuropathic denervation of the tibialis anterior and extensor digitorum brevis muscles.  The patient is sent to this office for an evaluation.  The patient does have a resting tremor involving the left upper extremity, she is on [name] for severe depression, she is on disability for depression.   REVIEW OF SYSTEMS: Out of a complete 14 system review of symptoms, the patient complains only of the following symptoms, and all other reviewed systems are negative.  Numbness, falls   ALLERGIES: Allergies  Allergen Reactions  . Talwin [Pentazocine]     Turned blue around  lips and face, rash all over body    HOME MEDICATIONS: Outpatient Medications Prior to Visit  Medication Sig Dispense Refill  . brexpiprazole (REXULTI) 2 MG TABS tablet Take 2 mg by mouth at bedtime.     . cyclobenzaprine (FLEXERIL) 10 MG tablet Take 10 mg by mouth daily as needed.    . Empagliflozin-metFORMIN HCl ER (SYNJARDY XR) 25-1000 MG TB24 Take 1 tablet by mouth daily before breakfast.     .  furosemide (LASIX) 20 MG tablet Take 20 mg by mouth daily as needed for fluid. Patient taking every other day    . lidocaine (LIDODERM) 5 % Place 1 patch onto the skin daily. Remove & Discard patch within 12 hours or as directed by MD 10 patch 0  . LINZESS 290 MCG CAPS capsule Take 290 mcg by mouth every morning.    . meclizine (ANTIVERT) 25 MG tablet Take 50 mg by mouth every 6 (six) hours as needed for dizziness.     . Omega 3 1000 MG CAPS Take 500 mg by mouth in the morning and at bedtime.     . ondansetron (ZOFRAN ODT) 4 MG disintegrating tablet Take 1 tablet (4 mg total) by mouth every 8 (eight) hours as needed for nausea or vomiting. 20 tablet 0  . ondansetron (ZOFRAN) 4 MG tablet Take 4 mg by mouth daily as needed for nausea.     [name] oxyCODONE (ROXICODONE) 5 MG immediate release tablet Take 1-2 tablets (5-10 mg total) by mouth every 8 (eight) hours as needed (breakthrough pain only). 20 tablet 0  . prazosin (MINIPRESS) 2 MG capsule Take 4 mg by mouth at bedtime.    [name] QUEtiapine (SEROQUEL) 400 MG tablet Take 800 mg by mouth at bedtime.    [name] QUEtiapine (SEROQUEL) 50 MG tablet Take 100 mg by mouth daily before breakfast.     . simvastatin (ZOCOR) 20 MG tablet Take 20 mg by mouth daily before breakfast.   6  . TIADYLT ER 120 MG 24 hr capsule Take 120 mg by mouth daily before breakfast.     . tiZANidine (ZANAFLEX) 4 MG tablet Take 4 mg by mouth 3 (three) times daily.     [name] topiramate (TOPAMAX) 100 MG tablet Take 100 mg by mouth daily as needed (Migraines).     . traMADol ([name]) 50 MG tablet Take 50 mg by mouth daily as needed for moderate pain.     . TRULICITY 3 KW/4.0XB SOPN Inject 1.75 mg into the skin once a week. Monday    . vortioxetine HBr ([name]) 20 MG TABS tablet Take 20 mg by mouth at bedtime.    . ziprasidone ([name]) 20 MG capsule Take 20 mg by mouth at bedtime.    . gabapentin (NEURONTIN) 400 MG capsule Take 1,200 mg by mouth at bedtime.      No facility-administered  medications prior to visit.    PAST MEDICAL HISTORY: Past Medical History:  Diagnosis Date  . Anxiety   . Cataract   . Cirrhosis of liver ([place])   . Depression   . Diabetes mellitus without complication ([place])   . Dysrhythmia   . Edema   . Family history of adverse reaction to anesthesia    mother had trouble waking up after surgery  . Foot fracture, left   . Headache    MIGRAINES  . Hyperlipidemia   . Hypertension   . Neuromuscular disorder ([place])    NEUROPATHY-both hands and feet  . Palpitations   .  Wheezing     PAST SURGICAL HISTORY: Past Surgical History:  Procedure Laterality Date  . ABDOMINAL HYSTERECTOMY     one ovary left  . BILIARY STENT PLACEMENT N/A 08/07/2019   Procedure: BILIARY STENT PLACEMENT;  Surgeon: [name], MD;  Location: [facility];  Service: Endoscopy;  Laterality: N/A;  . CATARACT EXTRACTION W/PHACO Right 01/11/2018   Procedure: CATARACT EXTRACTION PHACO AND INTRAOCULAR LENS PLACEMENT (IOC);  Surgeon: [name], MD;  Location: [facility] ORS;  Service: Ophthalmology;  Laterality: Right;  [place] 00:57.5 AP% 15.0 CDE 8.61 Fluid Pack Lot # [id number]  . CATARACT EXTRACTION W/PHACO Left 02/15/2018   Procedure: CATARACT EXTRACTION PHACO AND INTRAOCULAR LENS PLACEMENT (IOC);  Surgeon: [name], MD;  Location: [facility] ORS;  Service: Ophthalmology;  Laterality: Left;  [place] 00:51 AP% 13.2 CDE 6.82 Fluid p ack lot # [id number]  . CHOLECYSTECTOMY N/A 08/04/2019   Procedure: LAPAROSCOPIC CHOLECYSTECTOMY;  Surgeon: [name], MD;  Location: [facility];  Service: General;  Laterality: N/A;  . COLONOSCOPY    . ERCP N/A 08/07/2019   Procedure: ENDOSCOPIC RETROGRADE CHOLANGIOPANCREATOGRAPHY (ERCP);  Surgeon: [name], MD;  Location: [facility];  Service: Endoscopy;  Laterality: N/A;  . RCR Bilateral   . REMOVAL OF STONES  08/07/2019   Procedure: REMOVAL OF STONES;  Surgeon: [name], MD;  Location[facility];  Service: Endoscopy;;   balloon sweep, no stones  . SPHINCTEROTOMY  08/07/2019   Procedure: SPHINCTEROTOMY;  Surgeon: [name], MD;  Location: [facility];  Service: Endoscopy;;    FAMILY HISTORY: Family History  Problem Relation Age of Onset  . Diabetes Mother   . Heart failure Mother   . Depression Mother   . Colon cancer Neg Hx   . Stomach cancer Neg Hx   . Esophageal cancer Neg Hx   . Pancreatic cancer Neg Hx     SOCIAL HISTORY: Social History   Socioeconomic History  . Marital status: Married    Spouse name: Not on file  . Number of children: Not on file  . Years of education: Not on file  . Highest education level: Not on file  Occupational History  . Not on file  Tobacco Use  . Smoking status: Never Smoker  . Smokeless tobacco: Never Used  Substance and Sexual Activity  . Alcohol use: Never  . Drug use: Never  . Sexual activity: Not on file  Other Topics Concern  . Not on file  Social History Narrative  . Not on file   Social Determinants of Health   Financial Resource Strain:   . Difficulty of Paying Living Expenses:   Food Insecurity:   . Worried About Charity fundraiser in the Last Year:   . Arboriculturist in the Last Year:   Transportation Needs:   . Film/video editor (Medical):   [name] Lack of Transportation (Non-Medical):   Physical Activity:   . Days of Exercise per Week:   . Minutes of Exercise per Session:   Stress:   . Feeling of Stress :   Social Connections:   . Frequency of Communication with Friends and Family:   . Frequency of Social Gatherings with Friends and Family:   . Attends Religious Services:   . Active Member of Clubs or Organizations:   . Attends Archivist Meetings:   [name] Marital Status:   Intimate Partner Violence:   . Fear of Current or Ex-Partner:   . Emotionally Abused:   .  Physically Abused:   . Sexually Abused:    PHYSICAL EXAM  Vitals:   09/14/19 1221  BP: 91/62  Pulse: (!) 108  Temp: (!) 97.1 F (36.2 C)    Weight: 166 lb 9.6 oz (75.6 kg)  Height: 5' 5"  (1.651 m)   Body mass index is 27.72 kg/m.  Generalized: Well developed, in no acute distress   Neurological examination  Mentation: Alert oriented to time, place, history taking. Follows all commands speech and language fluent Cranial nerve II-XII: Pupils were equal round reactive to light. Extraocular movements were full, visual field were full on confrontational test. Facial sensation and strength were normal. Head turning and shoulder shrug  were normal and symmetric. Motor: Bilateral shoulder extension weakness, weak grip strength bilaterally, bilateral knee flexion, wasting of the first dorsal interosseous muscles on the right is noted, wearing left post op boot Sensory: Stocking pattern sensory deficit to the knees bilaterally with soft touch and pinprick, decreased pinprick sensation bilaterally to hands up to wrists Coordination: Cerebellar testing reveals good finger-nose-finger and heel-to-shin bilaterally.  Gait and station: Gait is slightly wide-based, uses a cane for ambulation.  She can walk independently in the room.  Tandem gait is mildly unsteady.  Romberg is negative. Reflexes: Deep tendon reflexes are absent bilaterally  DIAGNOSTIC DATA (LABS, IMAGING, TESTING) - I reviewed patient records, labs, notes, testing and imaging myself where available.  Lab Results  Component Value Date   WBC 5.0 08/11/2019   HGB 9.8 (L) 08/11/2019   HCT 32.1 (L) 08/11/2019   MCV 87.2 08/11/2019   PLT 168 08/11/2019      Component Value Date/Time   NA 142 08/11/2019 0445   NA 137 12/14/2012 1255   K 4.0 08/11/2019 0445   K 4.0 12/14/2012 1255   CL 104 08/11/2019 0445   CL 104 12/14/2012 1255   CO2 29 08/11/2019 0445   CO2 27 12/14/2012 1255   GLUCOSE 221 (H) 08/11/2019 0445   GLUCOSE 233 (H) 12/14/2012 1255   BUN 8 08/11/2019 0445   BUN 17 12/14/2012 1255   CREATININE 0.40 (L) 08/11/2019 0445   CREATININE 0.46 (L) 12/14/2012  1255   CALCIUM 8.2 (L) 08/11/2019 0445   CALCIUM 9.3 12/14/2012 1255   PROT 7.2 08/21/2019 1254   PROT 7.0 05/16/2019 1154   ALBUMIN 3.9 08/21/2019 1254   AST 123 (H) 08/21/2019 1254   ALT 62 (H) 08/21/2019 1254   ALKPHOS 177 (H) 08/21/2019 1254   BILITOT 0.4 08/21/2019 1254   GFRNONAA >60 08/11/2019 0445   GFRNONAA >60 12/14/2012 1255   GFRAA >60 08/11/2019 0445   GFRAA >60 12/14/2012 1255   Lab Results  Component Value Date   CHOL 202 (HH) 08/17/2007   HDL 27.4 (L) 08/17/2007   LDLDIRECT 128.1 08/17/2007   TRIG 284 (HH) 08/17/2007   CHOLHDL 7.4 CALC 08/17/2007   Lab Results  Component Value Date   HGBA1C 9.5 (H) 08/01/2019   Lab Results  Component Value Date   VITAMINB12 >2000 (H) 05/16/2019   Lab Results  Component Value Date   TSH 0.87 08/17/2007      ASSESSMENT AND PLAN 54 y.o. year old female  has a past medical history of Anxiety, Cataract, Cirrhosis of liver ([place]), Depression, Diabetes mellitus without complication ([place]), Dysrhythmia, Edema, Family history of adverse reaction to anesthesia, Foot fracture, left, Headache, Hyperlipidemia, Hypertension, Neuromuscular disorder ([place]), Palpitations, and Wheezing. here with:  1.  Diabetic peripheral neuropathy 2.  Gait disturbance  On exam, patient remains  areflexic, has weakness noted on exam with shoulder extension, and wasting of first dorsal interosseous muscle on the right.  She is not getting adequate pain control for her neuropathy with gabapentin.  I will discontinue gabapentin, she will start Lyrica 100 mg 3 times a day.  Discussed the need to watch out for further balance issues as side effect. She has been unable to participate in physical therapy due to the neuropathy pain.  Since last seen, she had a complicated attempted cholecystectomy, was found to have cirrhosis.  Will be having ERCP in June.  I will discuss her case with Dr. Jannifer Franklin, determine if any further work-up is necessary given her exam, history,  and subjective symptoms, will update the patient.  She will follow-up in 4 months or sooner if needed.  Addendum 09/19/2019 SS: Discussed with Dr. Jannifer Franklin, he recommended evaluation for hepatitis C, with recent report of cirrhosis.  Reviewed labs, appears hepatitis panel was done 08/08/2019, the hepatitis panel was nonreactive, including HCV Ab. Keep follow-up appointment in September (I called the patient and left a message).   Addendum 09/20/2019 SS: I called the patient. Relayed previous message. She said she received hepatitis injections, her cirrhosis was felt to be related to her diabetes, best of her knowledge.   I spent 30 minutes of face-to-face and non-face-to-face time with patient.  This included previsit chart review, lab review, study review, order entry, electronic health record documentation, patient education.    Butler Denmark, AGNP-C, DNP 09/14/2019, 12:57 PM [facility] [address] [place], [place] [address] [phone]

## 2019-09-15 NOTE — Progress Notes (Signed)
I have read the note, and I agree with the clinical assessment and plan.  Gaby Harney K Amairany Schumpert   

## 2019-09-18 NOTE — Telephone Encounter (Signed)
Pregabalin approved by [place], Medicare, 06/20/19 - 09/17/2020.  Approval letter faxed to pharmacy.

## 2019-09-18 NOTE — Telephone Encounter (Signed)
PA SENT through [facility] [id number] Pregabalin 100MG capsules

## 2019-09-19 ENCOUNTER — Ambulatory Visit (INDEPENDENT_AMBULATORY_CARE_PROVIDER_SITE_OTHER): Payer: Medicare Other | Admitting: Gastroenterology

## 2019-09-19 DIAGNOSIS — Z23 Encounter for immunization: Secondary | ICD-10-CM

## 2019-09-26 ENCOUNTER — Ambulatory Visit (HOSPITAL_COMMUNITY): Admit: 2019-09-26 | Payer: Medicare Other | Admitting: Gastroenterology

## 2019-09-26 ENCOUNTER — Encounter (HOSPITAL_COMMUNITY): Payer: Self-pay

## 2019-09-26 SURGERY — ENDOSCOPIC RETROGRADE CHOLANGIOPANCREATOGRAPHY (ERCP) WITH PROPOFOL
Anesthesia: Monitor Anesthesia Care

## 2019-09-27 ENCOUNTER — Ambulatory Visit: Payer: Medicare Other | Attending: Internal Medicine

## 2019-09-27 DIAGNOSIS — Z23 Encounter for immunization: Secondary | ICD-10-CM

## 2019-09-27 NOTE — Progress Notes (Signed)
   Covid-19 Vaccination Clinic  Name:  Kim Hicks    MRN: [id number] DOB: 1965/07/07  09/27/2019  Kim Hicks was observed post Covid-19 immunization for 15 minutes without incident. She was provided with Vaccine Information Sheet and instruction to access the V-Safe system.   Kim Hicks was instructed to call 911 with any severe reactions post vaccine: [name] Difficulty breathing  . Swelling of face and throat  . A fast heartbeat  . A bad rash all over body  . Dizziness and weakness   Immunizations Administered    Name Date Dose VIS Date Route   Pfizer COVID-19 Vaccine 09/27/2019  9:29 AM 0.3 mL 07/19/2018 Intramuscular   Manufacturer: [place]   Lot: [id number]   [place]: [address]-9

## 2019-10-02 ENCOUNTER — Ambulatory Visit (HOSPITAL_COMMUNITY): Payer: Medicare Other

## 2019-11-03 ENCOUNTER — Other Ambulatory Visit (HOSPITAL_COMMUNITY)
Admission: RE | Admit: 2019-11-03 | Discharge: 2019-11-03 | Disposition: A | Discharge status: Home / Self Care | Payer: Medicare Other | Source: Ambulatory Visit | Attending: Gastroenterology | Admitting: Gastroenterology

## 2019-11-03 DIAGNOSIS — Z20822 Contact with and (suspected) exposure to covid-19: Secondary | ICD-10-CM | POA: Insufficient documentation

## 2019-11-03 DIAGNOSIS — Z01812 Encounter for preprocedural laboratory examination: Secondary | ICD-10-CM | POA: Insufficient documentation

## 2019-11-03 LAB — SARS CORONAVIRUS 2 (TAT 6-24 HRS): SARS Coronavirus 2: NEGATIVE

## 2019-11-06 NOTE — Anesthesia Preprocedure Evaluation (Addendum)
Anesthesia Evaluation  Patient identified by MRN, date of birth, ID band Patient awake    Reviewed: Allergy & Precautions, NPO status , Patient's Chart, lab work & pertinent test results  History of Anesthesia Complications (+) Family history of anesthesia reaction  Airway Mallampati: II  TM Distance: >3 FB Neck ROM: Full    Dental no notable dental hx.    Pulmonary neg pulmonary ROS,    Pulmonary exam normal breath sounds clear to auscultation       Cardiovascular hypertension, Pt. on medications negative cardio ROS Normal cardiovascular exam+ dysrhythmias  Rhythm:Regular Rate:Normal     Neuro/Psych  Headaches, PSYCHIATRIC DISORDERS Anxiety Depression  Neuromuscular disease negative neurological ROS  negative psych ROS   GI/Hepatic negative GI ROS, Neg liver ROS, GERD  ,(+) Hepatitis -  Endo/Other  negative endocrine ROSdiabetes, Type 2  Renal/GU negative Renal ROS  negative genitourinary   Musculoskeletal negative musculoskeletal ROS (+)   Abdominal   Peds negative pediatric ROS (+)  Hematology negative hematology ROS (+)   Anesthesia Other Findings   Reproductive/Obstetrics negative OB ROS                            Anesthesia Physical  Anesthesia Plan  ASA: III  Anesthesia Plan: General   Post-op Pain Management:    Induction: Intravenous  PONV Risk Score and Plan: 3 and Ondansetron, Dexamethasone and Midazolam  Airway Management Planned: Oral ETT  Additional Equipment:   Intra-op Plan:   Post-operative Plan: Extubation in OR  Informed Consent: I have reviewed the patients History and Physical, chart, labs and discussed the procedure including the risks, benefits and alternatives for the proposed anesthesia with the patient or authorized representative who has indicated his/her understanding and acceptance.     Dental advisory given  Plan Discussed with: CRNA and  Anesthesiologist  Anesthesia Plan Comments:         Anesthesia Quick Evaluation

## 2019-11-07 ENCOUNTER — Ambulatory Visit (HOSPITAL_COMMUNITY): Payer: Medicare Other

## 2019-11-07 ENCOUNTER — Emergency Department (HOSPITAL_COMMUNITY): Payer: Medicare Other

## 2019-11-07 ENCOUNTER — Other Ambulatory Visit: Payer: Self-pay

## 2019-11-07 ENCOUNTER — Ambulatory Visit (HOSPITAL_COMMUNITY): Payer: Medicare Other | Admitting: Anesthesiology

## 2019-11-07 ENCOUNTER — Encounter (HOSPITAL_COMMUNITY)
Admission: RE | Disposition: A | Discharge status: Home / Self Care | Payer: Self-pay | Source: Home / Self Care | Attending: Gastroenterology

## 2019-11-07 ENCOUNTER — Emergency Department (HOSPITAL_COMMUNITY)
Admission: EM | Admit: 2019-11-07 | Discharge: 2019-11-07 | Disposition: A | Discharge status: Home / Self Care | Payer: Medicare Other | Source: Home / Self Care | Attending: Emergency Medicine | Admitting: Emergency Medicine

## 2019-11-07 ENCOUNTER — Ambulatory Visit (HOSPITAL_COMMUNITY)
Admission: RE | Admit: 2019-11-07 | Discharge: 2019-11-07 | Disposition: A | Discharge status: Home / Self Care | Payer: Medicare Other | Attending: Gastroenterology | Admitting: Gastroenterology

## 2019-11-07 ENCOUNTER — Encounter (HOSPITAL_COMMUNITY): Payer: Self-pay | Admitting: Emergency Medicine

## 2019-11-07 ENCOUNTER — Encounter (HOSPITAL_COMMUNITY): Payer: Self-pay | Admitting: Gastroenterology

## 2019-11-07 DIAGNOSIS — Z4659 Encounter for fitting and adjustment of other gastrointestinal appliance and device: Secondary | ICD-10-CM | POA: Diagnosis not present

## 2019-11-07 DIAGNOSIS — Z9049 Acquired absence of other specified parts of digestive tract: Secondary | ICD-10-CM | POA: Insufficient documentation

## 2019-11-07 DIAGNOSIS — K838 Other specified diseases of biliary tract: Secondary | ICD-10-CM | POA: Diagnosis not present

## 2019-11-07 DIAGNOSIS — Z79899 Other long term (current) drug therapy: Secondary | ICD-10-CM | POA: Insufficient documentation

## 2019-11-07 DIAGNOSIS — K5900 Constipation, unspecified: Secondary | ICD-10-CM | POA: Insufficient documentation

## 2019-11-07 DIAGNOSIS — E785 Hyperlipidemia, unspecified: Secondary | ICD-10-CM | POA: Diagnosis not present

## 2019-11-07 DIAGNOSIS — F329 Major depressive disorder, single episode, unspecified: Secondary | ICD-10-CM | POA: Insufficient documentation

## 2019-11-07 DIAGNOSIS — I10 Essential (primary) hypertension: Secondary | ICD-10-CM | POA: Insufficient documentation

## 2019-11-07 DIAGNOSIS — K7460 Unspecified cirrhosis of liver: Secondary | ICD-10-CM | POA: Insufficient documentation

## 2019-11-07 DIAGNOSIS — Z7984 Long term (current) use of oral hypoglycemic drugs: Secondary | ICD-10-CM | POA: Insufficient documentation

## 2019-11-07 DIAGNOSIS — K759 Inflammatory liver disease, unspecified: Secondary | ICD-10-CM | POA: Diagnosis not present

## 2019-11-07 DIAGNOSIS — E1142 Type 2 diabetes mellitus with diabetic polyneuropathy: Secondary | ICD-10-CM | POA: Diagnosis not present

## 2019-11-07 DIAGNOSIS — Z4689 Encounter for fitting and adjustment of other specified devices: Secondary | ICD-10-CM

## 2019-11-07 DIAGNOSIS — K9189 Other postprocedural complications and disorders of digestive system: Secondary | ICD-10-CM

## 2019-11-07 DIAGNOSIS — S2241XA Multiple fractures of ribs, right side, initial encounter for closed fracture: Secondary | ICD-10-CM

## 2019-11-07 HISTORY — PX: ENDOSCOPIC RETROGRADE CHOLANGIOPANCREATOGRAPHY (ERCP) WITH PROPOFOL: SHX5810

## 2019-11-07 HISTORY — PX: STENT REMOVAL: SHX6421

## 2019-11-07 HISTORY — PX: BILIARY STENT PLACEMENT: SHX5538

## 2019-11-07 LAB — CBC WITH DIFFERENTIAL/PLATELET
Abs Immature Granulocytes: 0.07 10*3/uL (ref 0.00–0.07)
Basophils Absolute: 0 10*3/uL (ref 0.0–0.1)
Basophils Relative: 0 %
Eosinophils Absolute: 0.1 10*3/uL (ref 0.0–0.5)
Eosinophils Relative: 1 %
HCT: 32.8 % — ABNORMAL LOW (ref 36.0–46.0)
Hemoglobin: 10.1 g/dL — ABNORMAL LOW (ref 12.0–15.0)
Immature Granulocytes: 1 %
Lymphocytes Relative: 11 %
Lymphs Abs: 0.8 10*3/uL (ref 0.7–4.0)
MCH: 29.3 pg (ref 26.0–34.0)
MCHC: 30.8 g/dL (ref 30.0–36.0)
MCV: 95.1 fL (ref 80.0–100.0)
Monocytes Absolute: 0.1 10*3/uL (ref 0.1–1.0)
Monocytes Relative: 2 %
Neutro Abs: 5.7 10*3/uL (ref 1.7–7.7)
Neutrophils Relative %: 85 %
Platelets: 98 10*3/uL — ABNORMAL LOW (ref 150–400)
RBC: 3.45 MIL/uL — ABNORMAL LOW (ref 3.87–5.11)
RDW: 16.5 % — ABNORMAL HIGH (ref 11.5–15.5)
WBC: 6.8 10*3/uL (ref 4.0–10.5)
nRBC: 0 % (ref 0.0–0.2)

## 2019-11-07 LAB — COMPREHENSIVE METABOLIC PANEL
ALT: 48 U/L — ABNORMAL HIGH (ref 0–44)
AST: 54 U/L — ABNORMAL HIGH (ref 15–41)
Albumin: 3.8 g/dL (ref 3.5–5.0)
Alkaline Phosphatase: 203 U/L — ABNORMAL HIGH (ref 38–126)
Anion gap: 12 (ref 5–15)
BUN: 31 mg/dL — ABNORMAL HIGH (ref 6–20)
CO2: 20 mmol/L — ABNORMAL LOW (ref 22–32)
Calcium: 8.4 mg/dL — ABNORMAL LOW (ref 8.9–10.3)
Chloride: 108 mmol/L (ref 98–111)
Creatinine, Ser: 0.57 mg/dL (ref 0.44–1.00)
GFR calc Af Amer: >60 mL/min (ref >60)
GFR calc non Af Amer: >60 mL/min (ref >60)
Glucose, Bld: 321 mg/dL — ABNORMAL HIGH (ref 70–99)
Potassium: 4.9 mmol/L (ref 3.5–5.1)
Sodium: 140 mmol/L (ref 135–145)
Total Bilirubin: 0.8 mg/dL (ref 0.3–1.2)
Total Protein: 6.7 g/dL (ref 6.5–8.1)

## 2019-11-07 LAB — TROPONIN I (HIGH SENSITIVITY)
Troponin I (High Sensitivity): <2 ng/L (ref <18)
Troponin I (High Sensitivity): <2 ng/L (ref <18)

## 2019-11-07 LAB — LIPASE, BLOOD: Lipase: 58 U/L — ABNORMAL HIGH (ref 11–51)

## 2019-11-07 LAB — GLUCOSE, CAPILLARY: Glucose-Capillary: 259 mg/dL — ABNORMAL HIGH (ref 70–99)

## 2019-11-07 SURGERY — ENDOSCOPIC RETROGRADE CHOLANGIOPANCREATOGRAPHY (ERCP) WITH PROPOFOL
Anesthesia: General

## 2019-11-07 MED ORDER — ROCURONIUM BROMIDE 10 MG/ML (PF) SYRINGE
PREFILLED_SYRINGE | INTRAVENOUS | Status: DC | PRN
  Administered 2019-11-07: 55 mg via INTRAVENOUS

## 2019-11-07 MED ORDER — MIDAZOLAM HCL 2 MG/2ML IJ SOLN
INTRAMUSCULAR | Status: AC
  Filled 2019-11-07: qty 2

## 2019-11-07 MED ORDER — ONDANSETRON HCL 4 MG/2ML IJ SOLN
4.0000 mg | Freq: Once | INTRAMUSCULAR | Status: AC
  Administered 2019-11-07: 4 mg via INTRAVENOUS
  Filled 2019-11-07: qty 2

## 2019-11-07 MED ORDER — OXYCODONE-ACETAMINOPHEN 5-325 MG PO TABS: 1.0000 | ORAL_TABLET | ORAL | 0 refills | Status: DC | PRN

## 2019-11-07 MED ORDER — FENTANYL CITRATE (PF) 100 MCG/2ML IJ SOLN
INTRAMUSCULAR | Status: AC
  Filled 2019-11-07: qty 2

## 2019-11-07 MED ORDER — EPHEDRINE SULFATE-NACL 50-0.9 MG/10ML-% IV SOSY
PREFILLED_SYRINGE | INTRAVENOUS | Status: DC | PRN
  Administered 2019-11-07: 5 mg via INTRAVENOUS
  Administered 2019-11-07: 10 mg via INTRAVENOUS
  Administered 2019-11-07: 5 mg via INTRAVENOUS

## 2019-11-07 MED ORDER — INDOMETHACIN 50 MG RE SUPP
RECTAL | Status: AC
  Filled 2019-11-07: qty 2

## 2019-11-07 MED ORDER — MORPHINE SULFATE (PF) 4 MG/ML IV SOLN
4.0000 mg | Freq: Once | INTRAVENOUS | Status: AC
  Administered 2019-11-07: 4 mg via INTRAVENOUS
  Filled 2019-11-07: qty 1

## 2019-11-07 MED ORDER — SODIUM CHLORIDE 0.9 % IV SOLN
INTRAVENOUS | Status: DC | PRN
  Administered 2019-11-07: 10 mL

## 2019-11-07 MED ORDER — PHENYLEPHRINE 40 MCG/ML (10ML) SYRINGE FOR IV PUSH (FOR BLOOD PRESSURE SUPPORT)
PREFILLED_SYRINGE | INTRAVENOUS | Status: DC | PRN
  Administered 2019-11-07 (×10): 80 ug via INTRAVENOUS

## 2019-11-07 MED ORDER — CIPROFLOXACIN IN D5W 400 MG/200ML IV SOLN
INTRAVENOUS | Status: DC | PRN
Start: 2019-11-07 — End: 2019-11-07
  Administered 2019-11-07: 400 mg via INTRAVENOUS

## 2019-11-07 MED ORDER — LACTATED RINGERS IV SOLN: INTRAVENOUS | Status: DC

## 2019-11-07 MED ORDER — MIDAZOLAM HCL 5 MG/5ML IJ SOLN
INTRAMUSCULAR | Status: DC | PRN
  Administered 2019-11-07 (×2): 1 mg via INTRAVENOUS

## 2019-11-07 MED ORDER — PROPOFOL 10 MG/ML IV BOLUS
INTRAVENOUS | Status: DC | PRN
  Administered 2019-11-07: 120 mg via INTRAVENOUS

## 2019-11-07 MED ORDER — IOHEXOL 350 MG/ML SOLN
100.0000 mL | Freq: Once | INTRAVENOUS | Status: AC | PRN
  Administered 2019-11-07: 100 mL via INTRAVENOUS

## 2019-11-07 MED ORDER — ONDANSETRON HCL 4 MG/2ML IJ SOLN
INTRAMUSCULAR | Status: DC | PRN
  Administered 2019-11-07: 4 mg via INTRAVENOUS

## 2019-11-07 MED ORDER — HYDROMORPHONE HCL 1 MG/ML IJ SOLN
0.5000 mg | Freq: Once | INTRAMUSCULAR | Status: AC
  Administered 2019-11-07: 0.5 mg via INTRAVENOUS
  Filled 2019-11-07: qty 1

## 2019-11-07 MED ORDER — GLUCAGON HCL RDNA (DIAGNOSTIC) 1 MG IJ SOLR
INTRAMUSCULAR | Status: AC
  Filled 2019-11-07: qty 1

## 2019-11-07 MED ORDER — GLUCAGON HCL RDNA (DIAGNOSTIC) 1 MG IJ SOLR
INTRAMUSCULAR | Status: DC | PRN
  Administered 2019-11-07 (×2): .25 mg via INTRAVENOUS

## 2019-11-07 MED ORDER — SODIUM CHLORIDE 0.9 % IV BOLUS
1000.0000 mL | Freq: Once | INTRAVENOUS | Status: AC
  Administered 2019-11-07: 1000 mL via INTRAVENOUS

## 2019-11-07 MED ORDER — FENTANYL CITRATE (PF) 100 MCG/2ML IJ SOLN
INTRAMUSCULAR | Status: DC | PRN
  Administered 2019-11-07: 50 ug via INTRAVENOUS

## 2019-11-07 MED ORDER — SODIUM CHLORIDE 0.9 % IV SOLN: INTRAVENOUS | Status: DC

## 2019-11-07 MED ORDER — INDOMETHACIN 50 MG RE SUPP
RECTAL | Status: DC | PRN
  Administered 2019-11-07: 100 mg via RECTAL

## 2019-11-07 MED ORDER — LIDOCAINE 2% (20 MG/ML) 5 ML SYRINGE
INTRAMUSCULAR | Status: DC | PRN
  Administered 2019-11-07: 80 mg via INTRAVENOUS

## 2019-11-07 MED ORDER — DEXAMETHASONE SODIUM PHOSPHATE 10 MG/ML IJ SOLN
INTRAMUSCULAR | Status: DC | PRN
  Administered 2019-11-07: 4 mg via INTRAVENOUS

## 2019-11-07 MED ORDER — CIPROFLOXACIN IN D5W 400 MG/200ML IV SOLN
INTRAVENOUS | Status: AC
  Filled 2019-11-07: qty 200

## 2019-11-07 MED ORDER — FENTANYL CITRATE (PF) 100 MCG/2ML IJ SOLN
25.0000 ug | Freq: Once | INTRAMUSCULAR | Status: AC
  Administered 2019-11-07: 25 ug via INTRAVENOUS

## 2019-11-07 MED ORDER — SUGAMMADEX SODIUM 200 MG/2ML IV SOLN
INTRAVENOUS | Status: DC | PRN
  Administered 2019-11-07: 200 mg via INTRAVENOUS

## 2019-11-07 MED ORDER — SODIUM CHLORIDE (PF) 0.9 % IJ SOLN
INTRAMUSCULAR | Status: AC
  Filled 2019-11-07: qty 50

## 2019-11-07 NOTE — Op Note (Signed)
[facility] Patient Name: Kim Hicks Procedure Date: 11/07/2019 MRN: [id number] Attending MD: [name] , MD Date of Birth: [birth year] CSN: [id number] Age: 54 Admit Type: Outpatient Procedure:                ERCP Indications:              Bile leak, Biliary stent removal Providers:                Pricilla Riffle. Fuller Plan, MD, Benetta Spar RN, RN,                            William Dalton, Technician Referring MD:             Victorino Sparrow. Kae Heller, MD Medicines:                General Anesthesia Complications:            No immediate complications. Estimated Blood Loss:     Estimated blood loss: none. Procedure:                Pre-Anesthesia Assessment:                           - Prior to the procedure, a History and Physical                            was performed, and patient medications and                            allergies were reviewed. The patient's tolerance of                            previous anesthesia was also reviewed. The risks                            and benefits of the procedure and the sedation                            options and risks were discussed with the patient.                            All questions were answered, and informed consent                            was obtained. Prior Anticoagulants: The patient has                            taken no previous anticoagulant or antiplatelet                            agents. ASA Grade Assessment: III - A patient with                            severe systemic disease. After reviewing the risks  and benefits, the patient was deemed in                            satisfactory condition to undergo the procedure.                           After obtaining informed consent, the scope was                            passed under direct vision. Throughout the                            procedure, the patient's blood pressure, pulse, and                            oxygen  saturations were monitored continuously. The                            TJF-Q180V ([id number] Olympus Duodenoscope was                            introduced through the mouth, and used to inject                            contrast into and used to inject contrast into the                            bile duct. The ERCP was accomplished without                            difficulty. The patient tolerated the procedure                            well. Scope In: Scope Out: Findings:      A scout film of the abdomen was obtained. Surgical clips, consistent       with a previous cholecystectomy and a biliary stent, were seen in the       area of the right upper quadrant of the abdomen. The scope was advanced       to the major papilla in the descending duodenum. Limited examination of       the pharynx, larynx and associated structures, and upper GI tract was       normal. Long position initially and was able to reduce to short position       however somewhat difficult to maintain position in short position. Prior       sphinctertomy and a biliary stent noted at the ampulla. A straight       Roadrunner wire was passed into the biliary tree. The short-nosed       traction sphincterotome was passed over the guidewire and the bile duct       was then deeply cannulated. Contrast was injected. I personally       interpreted the bile duct images. There was appropriate flow of contrast       through the ducts. Extravasation of contrast originating from the cystic  duct was observed. A cholecystectomy had been performed. The biliary       tree was otherwise normal. The biliary tree contained one plastic stent.       This was found to be totally occluded. The stent was removed using a       snare. A 10 Fr by 5 cm plastic stent with a single external flap and a       single internal flap was placed 4 cm into the common bile duct. Bile       flowed through the stent. The stent was in good position. The  PD was not       entered by intention. Impression:               - A bile leak was found at the cystic duct.                           - Prior cholecystectomy.                           - Prior sphincterotomy                           - One stent was exchanged in the common bile duct.                            New stent was upsized to 10 fr. Moderate Sedation:      Not Applicable - Patient had care per Anesthesia. Recommendation:           - Patient has a contact number available for                            emergencies. The signs and symptoms of potential                            delayed complications were discussed with the                            patient. Return to normal activities tomorrow.                            Written discharge instructions were provided to the                            patient.                           - Observe patient's clinical course following                            today's ERCP with therapeutic intervention.                           - Repeat ERCP in 2-3 months to reassess bile leak,                            remove or exchange stent.                           -  Clear liquid diet for 2 hours, then advance as                            tolerated to soft, low fat diet today.                           - Resume prior diet tomorrow.                           - Return to GI office in 1 month. Procedure Code(s):        --- Professional ---                           [phone], Endoscopic retrograde                            cholangiopancreatography (ERCP); with removal and                            exchange of stent(s), biliary or pancreatic duct,                            including pre- and post-dilation and guide wire                            passage, when performed, including sphincterotomy,                            when performed, each stent exchanged Diagnosis Code(s):        --- Professional ---                           K83.9, Disease  of biliary tract, unspecified                           Z90.49, Acquired absence of other specified parts                            of digestive tract                           Z46.59, Encounter for fitting and adjustment of                            other gastrointestinal appliance and device                           K83.8, Other specified diseases of biliary tract CPT copyright 2019 [employer]. All rights reserved. The codes documented in this report are preliminary and upon coder review may  be revised to meet current compliance requirements. [name], MD 11/07/2019 10:02:25 AM This report has been signed electronically. Number of Addenda: 0

## 2019-11-07 NOTE — ED Provider Notes (Signed)
[facility] DEPT Provider Note   CSN: [id number] Arrival date & time: 11/07/19  1129     History Chief Complaint  Patient presents with  . Post-op Problem  . Chest Pain    Kim Hicks is a 54 y.o. female medical history of cirrhosis of liver, diabetes, hyperlipidemia, hypertension who presents for evaluation of right-sided chest pain that began shortly after having an endoscopy.  She reports that she went for the endoscopy and when she woke up this, several minutes after she started having right-sided chest pain.  She states it is just right on her right rib cage underneath her right breast.  She states that the pain hurts when she moves, when she presses on the area and feels like she cannot take a deep breath because it makes the pain worse.  She was given fentanyl which slightly improved the pain but it did not take it away completely.  As she sat in PACU, the pain continued to get worse, prompting ED visit.  She states she was fine and in her normal state of health prior to the procedure.  She has not had any recent fevers, chills, difficulty breathing.  She states that the pain somewhat goes into her abdomen but she feels like it is mostly under her right breast.  She reports that she had the endoscopy to remove a stent in the biliary duct and to replace a stent.  She reports he has a history of hypertension and diabetes.  No personal cardiac history.  Her mom had a heart attack in her late 50s.  She does not smoke.  She denies any alcohol use.  The history is provided by the patient.       Past Medical History:  Diagnosis Date  . Anxiety   . Cataract   . Cirrhosis of liver ([place])   . Depression   . Diabetes mellitus without complication ([place])   . Dysrhythmia   . Edema   . Family history of adverse reaction to anesthesia    mother had trouble waking up after surgery  . Foot fracture, left   . Headache    MIGRAINES  . Hyperlipidemia   .  Hypertension   . Neuromuscular disorder ([place])    NEUROPATHY-both hands and feet  . Palpitations   . Wheezing     Patient Active Problem List   Diagnosis Date Noted  . Encounter for removal of biliary stent   . Diabetic peripheral neuropathy ([place]) 09/14/2019  . Gait disturbance 09/14/2019  . NASH (nonalcoholic steatohepatitis) 08/21/2019  . Bile leak   . Other cirrhosis of liver ([place]) 08/04/2019  . DYSPHAGIA UNSPECIFIED 03/04/2009  . GERD 02/18/2009  . PERSISTENT VOMITING 02/18/2009  . CHEST PAIN 02/18/2009  . NAUSEA AND VOMITING 02/18/2009  . DEHYDRATION 07/19/2007  . GASTROENTERITIS 07/19/2007  . FIBROIDS, UTERUS 05/02/2007  . DEPRESSION 05/02/2007  . MIGRAINE HEADACHE 05/02/2007  . Essential hypertension 05/02/2007  . HEMORRHOIDS 05/02/2007  . Chronic constipation 05/02/2007  . IBS 05/02/2007  . PSORIASIS 05/02/2007  . PITUITARY NEOPLASM, HX OF 05/02/2007  . LIVER FUNCTION TESTS, ABNORMAL, HX OF 05/02/2007  . DIABETES MELLITUS, TYPE II 02/04/2007  . HYPERLIPIDEMIA 02/04/2007  . SINUSITIS, ACUTE 02/04/2007  . ABDOMINAL PAIN, RIGHT UPPER QUADRANT 02/04/2007    Past Surgical History:  Procedure Laterality Date  . ABDOMINAL HYSTERECTOMY     one ovary left  . BILIARY STENT PLACEMENT N/A 08/07/2019   Procedure: BILIARY STENT PLACEMENT;  Surgeon: Lucio Edward  T, MD;  Location: [facility];  Service: Endoscopy;  Laterality: N/A;  . CATARACT EXTRACTION W/PHACO Right 01/11/2018   Procedure: CATARACT EXTRACTION PHACO AND INTRAOCULAR LENS PLACEMENT (IOC);  Surgeon: [name], MD;  Location: [facility] ORS;  Service: Ophthalmology;  Laterality: Right;  [place] 00:57.5 AP% 15.0 CDE 8.61 Fluid Pack Lot # [id number]  . CATARACT EXTRACTION W/PHACO Left 02/15/2018   Procedure: CATARACT EXTRACTION PHACO AND INTRAOCULAR LENS PLACEMENT (IOC);  Surgeon: [name], MD;  Location: [facility] ORS;  Service: Ophthalmology;  Laterality: Left;  [place] 00:51 AP% 13.2 CDE 6.82 Fluid p ack lot #  [id number]  . CHOLECYSTECTOMY N/A 08/04/2019   Procedure: LAPAROSCOPIC CHOLECYSTECTOMY;  Surgeon: [name], MD;  Location: [facility];  Service: General;  Laterality: N/A;  . COLONOSCOPY    . ERCP N/A 08/07/2019   Procedure: ENDOSCOPIC RETROGRADE CHOLANGIOPANCREATOGRAPHY (ERCP);  Surgeon: [name], MD;  Location: [facility];  Service: Endoscopy;  Laterality: N/A;  . RCR Bilateral   . REMOVAL OF STONES  08/07/2019   Procedure: REMOVAL OF STONES;  Surgeon: [name], MD;  Location: [facility];  Service: Endoscopy;;  balloon sweep, no stones  . SPHINCTEROTOMY  08/07/2019   Procedure: SPHINCTEROTOMY;  Surgeon: [name], MD;  Location: [facility];  Service: Endoscopy;;     OB History   No obstetric history on file.     Family History  Problem Relation Age of Onset  . Diabetes Mother   . Heart failure Mother   . Depression Mother   . Colon cancer Neg Hx   . Stomach cancer Neg Hx   . Esophageal cancer Neg Hx   . Pancreatic cancer Neg Hx     Social History   Tobacco Use  . Smoking status: Never Smoker  . Smokeless tobacco: Never Used  Vaping Use  . Vaping Use: Never used  Substance Use Topics  . Alcohol use: Never  . Drug use: Never    Home Medications Prior to Admission medications   Medication Sig Start Date End Date Taking? Authorizing Provider  calcium carbonate (OSCAL) 1500 (600 Ca) MG TABS tablet Take 600 mg of elemental calcium by mouth daily with breakfast.   Yes [provider]  Empagliflozin-metFORMIN HCl ER (SYNJARDY XR) 25-1000 MG TB24 Take 1 tablet by mouth daily before breakfast.  11/15/18  Yes [provider]  lidocaine (LMX) 4 % cream Apply 1 application topically as needed (apply to knee for pain prn).   Yes [provider]  LINZESS 290 MCG CAPS capsule Take 290 mcg by mouth every morning. 08/16/19  Yes [provider]  lisinopril (ZESTRIL) 20 MG tablet Take 20 mg by mouth daily.   Yes [provider]  meloxicam (MOBIC) 15 MG tablet Take 15 mg by mouth daily as needed for pain.   Yes [provider]  Omega-3 Fatty Acids (OMEGA 3 PO) Take 500 mg by mouth daily.    Yes [provider]  prazosin (MINIPRESS) 2 MG capsule Take 4 mg by mouth at bedtime.   Yes [provider]  pregabalin (LYRICA) 100 MG capsule Take 1 capsule (100 mg total) by mouth 3 (three) times daily. 09/14/19  Yes [name], NP  QUEtiapine (SEROQUEL) 400 MG tablet Take 800 mg by mouth at bedtime.   Yes [provider]  QUEtiapine (SEROQUEL) 50 MG tablet Take 100 mg by mouth daily before breakfast.    Yes [provider]  simvastatin (ZOCOR) 10 MG tablet Take  10 mg by mouth daily before breakfast.  12/13/17  Yes [provider]  traMADol ([name]) 50 MG tablet Take 50 mg by mouth daily as needed for moderate pain.  01/13/19  Yes [provider]  TRULICITY 3 DH/7.4BU SOPN Inject 1.75 mg into the skin once a week. Monday 12/20/18  Yes [provider]  vortioxetine HBr ([name]) 20 MG TABS tablet Take 20 mg by mouth at bedtime.   Yes [provider]  ziprasidone ([name]) 20 MG capsule Take 20 mg by mouth at bedtime.   Yes [provider]  oxyCODONE-acetaminophen (PERCOCET/ROXICET) 5-325 MG tablet Take 1-2 tablets by mouth every 4 (four) hours as needed for severe pain. 11/07/19   Volanda Napoleon, PA-C    Allergies    Talwin [pentazocine]  Review of Systems   Review of Systems  Constitutional: Negative for fever.  Respiratory: Negative for cough and shortness of breath.   Cardiovascular: Positive for chest pain.  Gastrointestinal: Negative for abdominal pain, nausea and vomiting.  Genitourinary: Negative for dysuria and hematuria.  Neurological: Negative for headaches.  All other systems reviewed and are negative.   Physical Exam Updated Vital Signs BP 136/82   Pulse 98   Temp 98.9 F (37.2 C) (Oral)   Resp 20    SpO2 95%   Physical Exam Vitals and nursing note reviewed.  Constitutional:      Appearance: Normal appearance. She is well-developed.  HENT:     Head: Normocephalic and atraumatic.  Eyes:     General: Lids are normal.     Conjunctiva/sclera: Conjunctivae normal.     Pupils: Pupils are equal, round, and reactive to light.  Cardiovascular:     Rate and Rhythm: Normal rate and regular rhythm.     Pulses: Normal pulses.     Heart sounds: Normal heart sounds. No murmur heard.  No friction rub. No gallop.   Pulmonary:     Effort: Pulmonary effort is normal.     Breath sounds: Normal breath sounds.     Comments: Lungs clear to auscultation bilaterally.  Symmetric chest rise.  No wheezing, rales, rhonchi. Chest:       Comments: Pain reproduced with palpation of the right anterior chest wall. Abdominal:     Palpations: Abdomen is soft. Abdomen is not rigid.     Tenderness: There is abdominal tenderness in the right upper quadrant. There is no guarding.       Comments: Abdomen soft, nondistended.  Mild tenderness of the right upper abdominal area.  No rigidity, guarding.  No CVA tenderness noted bilaterally.  Musculoskeletal:        General: Normal range of motion.     Cervical back: Full passive range of motion without pain.  Skin:    General: Skin is warm and dry.     Capillary Refill: Capillary refill takes less than 2 seconds.  Neurological:     Mental Status: She is alert and oriented to person, place, and time.  Psychiatric:        Speech: Speech normal.     ED Results / Procedures / Treatments   Labs (all labs ordered are listed, but only abnormal results are displayed) Labs Reviewed  COMPREHENSIVE METABOLIC PANEL - Abnormal; Notable for the following components:      Result Value   CO2 20 (*)    Glucose, Bld 321 (*)    BUN 31 (*)    Calcium 8.4 (*)    AST 54 (*)  ALT 48 (*)    Alkaline Phosphatase 203 (*)    All other components within normal limits  CBC  WITH DIFFERENTIAL/PLATELET - Abnormal; Notable for the following components:   RBC 3.45 (*)    Hemoglobin 10.1 (*)    HCT 32.8 (*)    RDW 16.5 (*)    Platelets 98 (*)    All other components within normal limits  LIPASE, BLOOD - Abnormal; Notable for the following components:   Lipase 58 (*)    All other components within normal limits  TROPONIN I (HIGH SENSITIVITY)  TROPONIN I (HIGH SENSITIVITY)    EKG EKG Interpretation  Date/Time:  Tuesday November 07 2019 11:48:39 EDT Ventricular Rate:  93 PR Interval:    QRS Duration: 108 QT Interval:  415 QTC Calculation: 517 R Axis:   -5 Text Interpretation: Sinus rhythm Borderline prolonged PR interval Prolonged QT interval Confirmed by Virgel Manifold [phone]) on 11/07/2019 4:47:00 PM   Radiology DG Chest 2 View  Result Date: 11/07/2019 CLINICAL DATA:  Chest pain EXAM: CHEST - 2 VIEW COMPARISON:  August 05, 2019 FINDINGS: There is mild atelectasis in the lingula. Lungs elsewhere are clear. Heart size and pulmonary vascularity are normal. No adenopathy. No pneumothorax. No bone lesions. IMPRESSION: Mild lingular atelectasis. No edema or airspace opacity. Cardiac silhouette normal. No adenopathy. Electronically Signed   By: Lowella Grip III M.D.   On: 11/07/2019 12:38   CT Angio Chest PE W and/or Wo Contrast  Result Date: 11/07/2019 CLINICAL DATA:  54 year old female with chest and upper abdominal pain status post biliary stent exchange today. History of bile leak, cholecystectomy. EXAM: CT ANGIOGRAPHY CHEST CT ABDOMEN AND PELVIS WITH CONTRAST TECHNIQUE: Multidetector CT imaging of the chest was performed using the standard protocol during bolus administration of intravenous contrast. Multiplanar CT image reconstructions and MIPs were obtained to evaluate the vascular anatomy. Multidetector CT imaging of the abdomen and pelvis was performed using the standard protocol during bolus administration of intravenous contrast. CONTRAST:  199m  OMNIPAQUE IOHEXOL 350 MG/ML SOLN [facility]:  [facility]no images available). CT Abdomen and Pelvis 01/04/2009 FINDINGS: CTA CHEST FINDINGS Cardiovascular: Adequate contrast bolus timing in the pulmonary arterial tree. No focal filling defect identified in the pulmonary arteries to suggest acute pulmonary embolism. Mild cardiomegaly. No pericardial effusion. Negative thoracic aorta. No calcified coronary artery atherosclerosis is evident. Mediastinum/Nodes: Mildly gas distended esophagus, otherwise negative. No lymphadenopathy. Lungs/Pleura: Major airways are patent. Mild dependent and subsegmental atelectasis, most pronounced in the left lower and right middle lobes. No pleural effusion. Musculoskeletal: Osteopenia. In the lower lateral ribs there is a minimally displaced acute appearing 8th rib fracture on series 8, image 100. But there are also subacute to chronic appearing fractures of the right lateral 3rd through 6th ribs, as well as the anterior right 9th and posterior 12th ribs. Thoracic vertebrae appear intact. Review of the MIP images confirms the above findings. CT ABDOMEN and PELVIS FINDINGS Hepatobiliary: Surgical clips in the gallbladder fossa with small volume mildly complex density fluid there (series 4, image 22). Trace gas as well. Superimposed CBD stent which terminates in the duodenum, no adverse features identified. No intrahepatic biliary ductal dilatation or definite periportal edema. Liver enhancement remains within normal limits. Pancreas: Negative. Spleen: Stable borderline to mild splenomegaly since 2010. Adrenals/Urinary Tract: Negative adrenal glands. Bilateral renal enhancement and contrast excretion is symmetric and normal. Decompressed ureters. Mildly distended urinary bladder with estimated bladder volume 300 mL. Stomach/Bowel: Redundant large  bowel with retained stool throughout. No large bowel inflammation identified. Normal appendix visible on  series 4, image 60. There is trace fluid in the left pericolic gutter on series 4, image 51. The cecum is on a lax mesentery. Negative terminal ileum. No dilated small bowel. Decompressed stomach. No free air. Biliary stent terminates in the 2nd portion of the duodenum on series 4, image 34. No a duodenal inflammation is evident. The duodenum extends to the midline but the proximal small bowel loops are then in the right upper quadrant, which is a change from 2010. But there is no significant swirling of the mesentery. No inflamed or dilated small bowel. No other free fluid. Vascular/Lymphatic: Major arterial structures are patent with no atherosclerosis identified. Portal venous system is patent. No lymphadenopathy. Reproductive: Surgically absent uterus. Diminutive or absent ovaries. Other: No pelvic free fluid. Musculoskeletal: Right lower rib findings detailed above. Negative lumbar spine and pelvis. Review of the MIP images confirms the above findings. IMPRESSION: 1. Negative for acute pulmonary embolus. Mild pulmonary atelectasis. 2. A minimally displaced fracture of the right lateral 8th rib seems acute, although there are otherwise non-acute fractures of the right 3rd through 9th ribs. 3. Postoperative changes to the gallbladder fossa with small volume mildly complex fluid and trace gas. Nearby biliary stent with no adverse features. 4. Trace fluid in the left pericolic gutter. No bowel inflammation identified, although there is seemingly midgut malrotation (proximal small bowel loops in the right upper quadrant) which is new from a 2010 CT. Redundant large bowel with retained stool. 5. Mildly distended urinary bladder, 300 mL. Electronically Signed   By: Genevie Ann M.D.   On: 11/07/2019 15:14   CT ABDOMEN PELVIS W CONTRAST  Result Date: 11/07/2019 CLINICAL DATA:  54 year old female with chest and upper abdominal pain status post biliary stent exchange today. History of bile leak, cholecystectomy. EXAM: CT  ANGIOGRAPHY CHEST CT ABDOMEN AND PELVIS WITH CONTRAST TECHNIQUE: Multidetector CT imaging of the chest was performed using the standard protocol during bolus administration of intravenous contrast. Multiplanar CT image reconstructions and MIPs were obtained to evaluate the vascular anatomy. Multidetector CT imaging of the abdomen and pelvis was performed using the standard protocol during bolus administration of intravenous contrast. CONTRAST:  140m OMNIPAQUE IOHEXOL 350 MG/ML SOLN [facility]PARISON:  [facility] (no images available). CT Abdomen and Pelvis 01/04/2009 FINDINGS: CTA CHEST FINDINGS Cardiovascular: Adequate contrast bolus timing in the pulmonary arterial tree. No focal filling defect identified in the pulmonary arteries to suggest acute pulmonary embolism. Mild cardiomegaly. No pericardial effusion. Negative thoracic aorta. No calcified coronary artery atherosclerosis is evident. Mediastinum/Nodes: Mildly gas distended esophagus, otherwise negative. No lymphadenopathy. Lungs/Pleura: Major airways are patent. Mild dependent and subsegmental atelectasis, most pronounced in the left lower and right middle lobes. No pleural effusion. Musculoskeletal: Osteopenia. In the lower lateral ribs there is a minimally displaced acute appearing 8th rib fracture on series 8, image 100. But there are also subacute to chronic appearing fractures of the right lateral 3rd through 6th ribs, as well as the anterior right 9th and posterior 12th ribs. Thoracic vertebrae appear intact. Review of the MIP images confirms the above findings. CT ABDOMEN and PELVIS FINDINGS Hepatobiliary: Surgical clips in the gallbladder fossa with small volume mildly complex density fluid there (series 4, image 22). Trace gas as well. Superimposed CBD stent which terminates in the duodenum, no adverse features identified. No intrahepatic biliary ductal dilatation or definite periportal edema. Liver enhancement remains  within normal limits. Pancreas: Negative. Spleen: Stable borderline to mild splenomegaly since 2010. Adrenals/Urinary Tract: Negative adrenal glands. Bilateral renal enhancement and contrast excretion is symmetric and normal. Decompressed ureters. Mildly distended urinary bladder with estimated bladder volume 300 mL. Stomach/Bowel: Redundant large bowel with retained stool throughout. No large bowel inflammation identified. Normal appendix visible on series 4, image 60. There is trace fluid in the left pericolic gutter on series 4, image 51. The cecum is on a lax mesentery. Negative terminal ileum. No dilated small bowel. Decompressed stomach. No free air. Biliary stent terminates in the 2nd portion of the duodenum on series 4, image 34. No a duodenal inflammation is evident. The duodenum extends to the midline but the proximal small bowel loops are then in the right upper quadrant, which is a change from 2010. But there is no significant swirling of the mesentery. No inflamed or dilated small bowel. No other free fluid. Vascular/Lymphatic: Major arterial structures are patent with no atherosclerosis identified. Portal venous system is patent. No lymphadenopathy. Reproductive: Surgically absent uterus. Diminutive or absent ovaries. Other: No pelvic free fluid. Musculoskeletal: Right lower rib findings detailed above. Negative lumbar spine and pelvis. Review of the MIP images confirms the above findings. IMPRESSION: 1. Negative for acute pulmonary embolus. Mild pulmonary atelectasis. 2. A minimally displaced fracture of the right lateral 8th rib seems acute, although there are otherwise non-acute fractures of the right 3rd through 9th ribs. 3. Postoperative changes to the gallbladder fossa with small volume mildly complex fluid and trace gas. Nearby biliary stent with no adverse features. 4. Trace fluid in the left pericolic gutter. No bowel inflammation identified, although there is seemingly midgut malrotation  (proximal small bowel loops in the right upper quadrant) which is new from a 2010 CT. Redundant large bowel with retained stool. 5. Mildly distended urinary bladder, 300 mL. Electronically Signed   By: Genevie Ann M.D.   On: 11/07/2019 15:14   DG ERCP  Result Date: 11/07/2019 CLINICAL DATA:  ERCP with biliary stent exchange. History of cystic duct remnant leak. EXAM: ERCP TECHNIQUE: Multiple spot images obtained with the fluoroscopic device and submitted for interpretation post-procedure. FLUOROSCOPY TIME:  1 minute, 12 seconds COMPARISON:  ERCP-08/07/2019; nuclear medicine HIDA scan-08/06/2019 FINDINGS: 11 spot intraoperative fluoroscopic images during ERCP are provided for review Initial image demonstrates an ERCP probe overlying the right upper abdominal quadrant. A biliary stent overlies expected location of the CBD. Cholecystectomy clips overlie the gallbladder fossa. Subsequent images demonstrate removal of the internal biliary stent. Subsequent images demonstrate selective cannulation and opacification of the CBD. There is persistent extravasation of injected contrast from the level of the cystic duct remnant (best seen on image 9). Subsequent images demonstrate placement of a new internal biliary stent. IMPRESSION: ERCP with findings suggestive of persistent biliary leak from the cystic duct with subsequent biliary stent exchange. These images were submitted for radiologic interpretation only. Please see the procedural report for the amount of contrast and the fluoroscopy time utilized. Electronically Signed   By: Sandi Mariscal M.D.   On: 11/07/2019 10:21    Procedures Procedures (including critical care time)  Medications Ordered in ED Medications  sodium chloride (PF) 0.9 % injection (has no administration in time range)  sodium chloride 0.9 % bolus 1,000 mL (0 mLs Intravenous Stopped 11/07/19 1421)  ondansetron (ZOFRAN) injection 4 mg (4 mg Intravenous Given 11/07/19 1241)  morphine 4 MG/ML  injection 4 mg (4 mg Intravenous Given 11/07/19 1241)  HYDROmorphone (DILAUDID) injection 0.5 mg (  0.5 mg Intravenous Given 11/07/19 1348)  iohexol (OMNIPAQUE) 350 MG/ML injection 100 mL (100 mLs Intravenous Contrast Given 11/07/19 1410)    ED Course  I have reviewed the triage vital signs and the nursing notes.  Pertinent labs & imaging results that were available during my care of the patient were reviewed by me and considered in my medical decision making (see chart for details).    MDM Rules/Calculators/A&P                          54 year old female who presents for evaluation of right-sided chest pain that began after an endoscopy earlier this morning.  Reports she was in her normal state of health and then a few minutes after waking up from the endoscopy, she started having sharp pain in the right side of her chest, underneath her right breast.  Pain is worse with movement and with deep inspiration.  Initially arrival, nontoxic but uncomfortable.  She is afebrile.  No acute distress.  Vital signs are stable.  On exam, she has good breath sounds noted bilaterally.  Pain is reproduced with palpation as well as movement.  Consider postop pain/complication.  Also consider ACS etiology though story sounds atypical.  Plan to check labs, chest x-ray.  Review of records show that she sees Dr. Fuller Plan (GI).  She had a laparoscopic cholecystectomy on 08/04/2019.  At the time of her cholecystectomy, she was noted to have cirrhotic appearing liver.  She was also found to have a bile leak. She underwent ERCP with stent placement on 08/07/2019.  CBC shows hemoglobin of 10.1.  This is baseline for her.  Lipase is 58.  Troponin negative.  CMP shows bicarb of 20, glucose of 321.  AST is 54, ALT is 48, alk phos is 203.  She has had elevations in her LFTs prior. They are pretty consistent from what they were 2 months ago.   CXR shows mild atelectasis in the lingular. Otherwise no abnormalities.   Patient with  continued pain.  She had an episode where she was requiring oxygen because she felt like she could not take a deep breath and because of the pain.  She has no oxygen requirement at home.  We will plan for imaging of chest/abdomen pelvis to see if there is any postop abnormality.  Imaging negative for PE.  There is a minimally displaced fracture of the right lateral eighth rib that seems acute.  There is also mention of subacute to chronic appearing fractures of the right third through sixth ribs.  She also has subacute to chronic rib fractures of the ninth and 12th ribs on the right side.  Biliary stent seen with no adverse features.  There is postoperative changes to the gallbladder fossa with volume mildly complex fluid and trace gas.  I discussed results with patient.  She reports feeling better.  She does not recall any recent falls though she said she fell about 10 weeks ago.  I discussed with her that there is some osteopenia seen on the CT scan.  I discussed options with her.  She did have an episode where her O2 sat dropped down to 89% after getting Dilaudid.  This has recovered and she has been mainly without hypoxia here in the ED.  After discussion regarding discharge home with pain control versus admission, patient wishes to be discharged home with pain medication. Discussed patient with Dr. Wilson Singer who is agreeable plan. Patient had ample opportunity  for questions and discussion. All patient's questions were answered with full understanding. Strict return precautions discussed. Patient expresses understanding and agreement to plan.   Portions of this note were generated with Lobby[employer]. Dictation errors may occur despite best attempts at proofreading.  Final Clinical Impression(s) / ED Diagnoses Final diagnoses:  Closed fracture of multiple ribs of right side, initial encounter    Rx / DC Orders ED Discharge Orders         Ordered    oxyCODONE-acetaminophen  (PERCOCET/ROXICET) 5-325 MG tablet  Every 4 hours PRN     Discontinue  Reprint     11/07/19 1654           Volanda Napoleon, PA-C 11/07/19 1750    Virgel Manifold, MD 11/10/19 1202

## 2019-11-07 NOTE — ED Triage Notes (Signed)
Pt was in endoscopy having a biliary stent placed and began having R lower chest pain under her L breast. Sent in for eval. Pain unchanged by pain meds given PTA. 20g lac. Alert and oriented.

## 2019-11-07 NOTE — ED Notes (Signed)
Patient transported to CT 

## 2019-11-07 NOTE — Discharge Instructions (Signed)
As we discussed, your CT scan showed normal postoperative changes.  Additionally, he mentioned a right eighth rib fracture.  This seems to be acute.  He also have some rib fractures on the third through 6 and the ninth through 12th ribs that seem to have been there for a long time.  This is most likely contributing to your pain.  Use the incentive spirometer as directed.  You can take Tylenol or Ibuprofen as directed for pain. You can alternate Tylenol and Ibuprofen every 4 hours. If you take Tylenol at 1pm, then you can take Ibuprofen at 5pm. Then you can take Tylenol again at 9pm.   Take pain medications as directed for break through pain. Do not drive or operate machinery while taking this medication.   Follow-up with your primary care doctor in 2 to 4 days.  Return the emergency department for any worsening pain, difficulty breathing, fevers, cough or any other worsening or concerning symptoms.

## 2019-11-07 NOTE — Progress Notes (Signed)
Pt complaining of post procedure pain.  MD Fuller Plan at bedside.  New orders for IV Fentanyl placed.  Pain improved after medication administered.  Family notified of post procedure pain.  Will continue to monitor pt.

## 2019-11-07 NOTE — Progress Notes (Signed)
Pt still complaining of sharp pain below right ribcage.  Pain described as sharp.  Pt not taking deep breaths due to pain with inhalation.  MD Fuller Plan at bedside and states pain is not related to procedure and pt will need to be admitted to the ED for observation and tests.  Pt verbalized understanding.  Family notified.  Pt to transfer directly to ED from Endo.

## 2019-11-07 NOTE — Discharge Instructions (Signed)
YOU HAD AN ENDOSCOPIC PROCEDURE TODAY: Refer to the procedure report and other information in the discharge instructions given to you for any specific questions about what was found during the examination. If this information does not answer your questions, please call [facility] office at [phone] to clarify.   YOU SHOULD EXPECT: Some feelings of bloating in the abdomen. Passage of more gas than usual. Walking can help get rid of the air that was put into your GI tract during the procedure and reduce the bloating. If you had a lower endoscopy (such as a colonoscopy or flexible sigmoidoscopy) you may notice spotting of blood in your stool or on the toilet paper. Some abdominal soreness may be present for a day or two, also.  DIET: Your first meal following the procedure should be a light meal and then it is ok to progress to your normal diet. A half-sandwich or bowl of soup is an example of a good first meal. Heavy or fried foods are harder to digest and may make you feel nauseous or bloated. Drink plenty of fluids but you should avoid alcoholic beverages for 24 hours. If you had a esophageal dilation, please see attached instructions for diet.    ACTIVITY: Your care partner should take you home directly after the procedure. You should plan to take it easy, moving slowly for the rest of the day. You can resume normal activity the day after the procedure however YOU SHOULD NOT DRIVE, use power tools, machinery or perform tasks that involve climbing or major physical exertion for 24 hours (because of the sedation medicines used during the test).   SYMPTOMS TO REPORT IMMEDIATELY: A gastroenterologist can be reached at any hour. Please call [phone]  for any of the following symptoms:   Following upper endoscopy (EGD, EUS, ERCP, esophageal dilation) Vomiting of blood or coffee ground material  New, significant abdominal pain  New, significant chest pain or pain under the shoulder blades  Painful or  persistently difficult swallowing  New shortness of breath  Black, tarry-looking or red, bloody stools  FOLLOW UP:  If any biopsies were taken you will be contacted by phone or by letter within the next 1-3 weeks. Call [phone]  if you have not heard about the biopsies in 3 weeks.  Please also call with any specific questions about appointments or follow up tests.  

## 2019-11-07 NOTE — H&P (Signed)
HISTORY OF PRESENT ILLNESS: This is a pleasant 54 year old female who is a patient of Dr. Lynne Leader.  She underwent laparoscopic cholecystectomy on March 12 as she had an outpatient ultrasound performed at [facility] on November 2020, which showed multiple gallstones, gallbladder wall 3.4 mm, and CBD of 3 mm with also noted coarsened hepatic echotexture.  At the time of her cholecystectomy she was noted to have a cirrhotic appearing liver but liver biopsy was not performed, however.  Subsequently she was found to have a bile leak.  Underwent ERCP with stent placement on March 15 by Dr. Fuller Plan.  Extensive serologic liver evaluation was performed in the hospital and thus far has been negative, suspect NASH as a cause of her liver disease.  She does not drink alcohol.  Colonoscopy January 2020 with poor prep and repeat recommended in 3 months.  She tells me that she has been taking her Linzess 290 mcg daily and it seems to be working well as she has been having bowel movements just about every day.  Mild upper abdominal pain which has persisted. Constipation improved however still having hard, small stools.       Past Medical History:  Diagnosis Date  . Anxiety   . Cataract   . Cirrhosis of liver ([place])   . Depression   . Diabetes mellitus without complication ([place])   . Dysrhythmia   . Edema   . Family history of adverse reaction to anesthesia    mother had trouble waking up after surgery  . Headache    MIGRAINES  . Hyperlipidemia   . Hypertension   . Neuromuscular disorder ([place])    NEUROPATHY-both hands and feet  . Palpitations   . Wheezing         Past Surgical History:  Procedure Laterality Date  . ABDOMINAL HYSTERECTOMY     one ovary left  . BILIARY STENT PLACEMENT N/A 08/07/2019   Procedure: BILIARY STENT PLACEMENT;  Surgeon: [name], MD;  Location: [facility];  Service: Endoscopy;  Laterality: N/A;  . CATARACT EXTRACTION W/PHACO Right 01/11/2018    Procedure: CATARACT EXTRACTION PHACO AND INTRAOCULAR LENS PLACEMENT (IOC);  Surgeon: [name], MD;  Location: [facility] ORS;  Service: Ophthalmology;  Laterality: Right;  [place] 00:57.5 AP% 15.0 CDE 8.61 Fluid Pack Lot # [id number]  . CATARACT EXTRACTION W/PHACO Left 02/15/2018   Procedure: CATARACT EXTRACTION PHACO AND INTRAOCULAR LENS PLACEMENT (IOC);  Surgeon: [name], MD;  Location: [facility] ORS;  Service: Ophthalmology;  Laterality: Left;  [place] 00:51 AP% 13.2 CDE 6.82 Fluid p ack lot # [id number]  . CHOLECYSTECTOMY N/A 08/04/2019   Procedure: LAPAROSCOPIC CHOLECYSTECTOMY;  Surgeon: [name], MD;  Location: [facility];  Service: General;  Laterality: N/A;  . COLONOSCOPY    . ERCP N/A 08/07/2019   Procedure: ENDOSCOPIC RETROGRADE CHOLANGIOPANCREATOGRAPHY (ERCP);  Surgeon: [name], MD;  Location: [facility];  Service: Endoscopy;  Laterality: N/A;  . RCR Bilateral   . REMOVAL OF STONES  08/07/2019   Procedure: REMOVAL OF STONES;  Surgeon: [name], MD;  Location: [facility];  Service: Endoscopy;;  balloon sweep, no stones  . SPHINCTEROTOMY  08/07/2019   Procedure: SPHINCTEROTOMY;  Surgeon: [name], MD;  Location: [facility];  Service: Endoscopy;;    reports that she has never smoked. She has never used smokeless tobacco. She reports that she does not drink alcohol or use drugs. family history includes Depression in her mother; Diabetes in her mother; Heart failure in her mother.  Allergies  Allergen Reactions  . Talwin [Pentazocine]     Turned blue around lips and face, rash all over body          Outpatient Encounter Medications as of 08/21/2019  Medication Sig  . brexpiprazole (REXULTI) 2 MG TABS tablet Take 2 mg by mouth at bedtime.   . cyclobenzaprine (FLEXERIL) 10 MG tablet Take 10 mg by mouth daily as needed.  . Empagliflozin-metFORMIN HCl ER (SYNJARDY XR) 25-1000 MG TB24 Take 1 tablet by mouth daily before breakfast.   .  furosemide (LASIX) 20 MG tablet Take 20 mg by mouth daily as needed for fluid. Patient taking every other day  . gabapentin (NEURONTIN) 400 MG capsule Take 1,200 mg by mouth at bedtime.   . lidocaine (LIDODERM) 5 % Place 1 patch onto the skin daily. Remove & Discard patch within 12 hours or as directed by MD  . meclizine (ANTIVERT) 25 MG tablet Take 50 mg by mouth every 6 (six) hours as needed for dizziness.   . Omega 3 1000 MG CAPS Take 500 mg by mouth in the morning and at bedtime.   . ondansetron (ZOFRAN ODT) 4 MG disintegrating tablet Take 1 tablet (4 mg total) by mouth every 8 (eight) hours as needed for nausea or vomiting.  . ondansetron (ZOFRAN) 4 MG tablet Take 4 mg by mouth daily as needed for nausea.   [name] oxyCODONE (ROXICODONE) 5 MG immediate release tablet Take 1-2 tablets (5-10 mg total) by mouth every 8 (eight) hours as needed (breakthrough pain only).  . prazosin (MINIPRESS) 2 MG capsule Take 4 mg by mouth at bedtime.  [name] QUEtiapine (SEROQUEL) 400 MG tablet Take 800 mg by mouth at bedtime.  [name] QUEtiapine (SEROQUEL) 50 MG tablet Take 100 mg by mouth daily before breakfast.   . simvastatin (ZOCOR) 20 MG tablet Take 20 mg by mouth daily before breakfast.   . TIADYLT ER 120 MG 24 hr capsule Take 120 mg by mouth daily before breakfast.   . tiZANidine (ZANAFLEX) 4 MG tablet Take 4 mg by mouth 3 (three) times daily.   [name] topiramate (TOPAMAX) 100 MG tablet Take 100 mg by mouth daily as needed (Migraines).   . traMADol ([name]) 50 MG tablet Take 50 mg by mouth daily as needed for moderate pain.   . TRULICITY 3 TZ/0.0FV SOPN Inject 1.75 mg into the skin once a week. Monday  . vortioxetine HBr ([name]) 20 MG TABS tablet Take 20 mg by mouth at bedtime.  . ziprasidone ([name]) 20 MG capsule Take 20 mg by mouth at bedtime.   No facility-administered encounter medications on file as of 08/21/2019.     REVIEW OF SYSTEMS  : All other systems reviewed and negative except where noted in the  History of Present Illness.   PHYSICAL EXAM: BP 96/62   Pulse 94   Temp 97.6 F (36.4 C)   Ht 5' 5"  (1.651 m)   Wt 162 lb (73.5 kg)   BMI 26.96 kg/m  General: Well developed white female in no acute distress Head: Normocephalic and atraumatic Eyes:  Sclerae anicteric, conjunctiva pink. Ears: Normal auditory acuity Lungs: Clear throughout to auscultation; no increased WOB. Heart: Regular rate and rhythm; no M/R/G. Abdomen: Soft, non-distended.  BS present.  Non-tender except near her JP drain.  Site looks slightly red and irritated.  Minute amount of light yellow fluid in bulb. Musculoskeletal: Symmetrical with no gross deformities  Skin: No lesions on visible extremities Extremities: No edema  Neurological: Alert  oriented x 4, grossly non-focal Psychological:  Alert and cooperative. Normal mood and affect  ASSESSMENT AND PLAN: 1. Bile leak:  Status post lap cholecystectomy on 3/12, HIDA scan was positive for bile leak and underwent ERCP with stent on 08/07/2019.  JP drain removed about 1 month ago. 2. New finding of cirrhosis:Appears compensated with no coagulopathy or thrombocytopenia, ultrasound 2012 with suggested hepatic steatosis versus possible early cirrhosis then confirmed on more recent ultrasound at [facility] and at the time of surgery.  Extensive evaluation negative thus far, suspect NASH.   3. Severe constipation:  Moving her bowel regularly on Linzess 290 mcg daily.  Add Miralax qd to bid.  ERCP with stent removal.  We will schedule that with Dr. Fuller Plan.  The risks, benefits, and alternatives to ERCP were discussed with the patient and shes consents to proceed.  *Will begin hepatitis A and B vaccinations as patient does not have positive antibodies. *We will need repeat imaging in the form of ultrasound for [facility] surveillance in May. *We will repeat LFTs today along with celiac labs and iron studies. *We will need repeat colonoscopy later this year, but will let her  recover from this biliary situation for now.

## 2019-11-07 NOTE — Transfer of Care (Signed)
Immediate Anesthesia Transfer of Care Note  Patient: Kim Hicks  Procedure(s) Performed: Procedure(s): ENDOSCOPIC RETROGRADE CHOLANGIOPANCREATOGRAPHY (ERCP) WITH PROPOFOL (N/A)  Patient Location: PACU  Anesthesia Type:General  Level of Consciousness:  sedated, patient cooperative and responds to stimulation  Airway & Oxygen Therapy:Patient Spontanous Breathing and Patient connected to face mask oxgen  Post-op Assessment:  Report given to PACU RN and Post -op Vital signs reviewed and stable  Post vital signs:  Reviewed and stable  Last Vitals:  Vitals:   11/07/19 0825 11/07/19 1002  BP: (!) 98/49   Pulse: 94 95  Resp: 18 15  Temp: 36.5 C   SpO2: 300% 923%    Complications: No apparent anesthesia complications

## 2019-11-07 NOTE — Anesthesia Postprocedure Evaluation (Signed)
Anesthesia Post Note  Patient: Kim Hicks  Procedure(s) Performed: ENDOSCOPIC RETROGRADE CHOLANGIOPANCREATOGRAPHY (ERCP) WITH PROPOFOL (N/A ) STENT REMOVAL BILIARY STENT PLACEMENT (N/A )     Patient location during evaluation: PACU Anesthesia Type: General Level of consciousness: awake and alert Pain management: pain level controlled Vital Signs Assessment: post-procedure vital signs reviewed and stable Respiratory status: spontaneous breathing, nonlabored ventilation, respiratory function stable and patient connected to nasal cannula oxygen Cardiovascular status: blood pressure returned to baseline and stable Postop Assessment: no apparent nausea or vomiting Anesthetic complications: no   No complications documented.  Last Vitals:  Vitals:   11/07/19 1100 11/07/19 1110  BP: 129/65 140/66  Pulse: 92 92  Resp: 19 16  Temp:    SpO2: 92% 95%    Last Pain:  Vitals:   11/07/19 1110  TempSrc:   PainSc: 5                  Genevieve Arbaugh

## 2019-11-07 NOTE — ED Notes (Signed)
Pt educated on how to use an incentive spirometer.

## 2019-11-07 NOTE — Anesthesia Procedure Notes (Signed)
Procedure Name: Intubation Date/Time: 11/07/2019 9:38 AM Performed by: Lavina Hamman, CRNA Pre-anesthesia Checklist: Patient identified, Emergency Drugs available, Suction available, Patient being monitored and Timeout performed Patient Re-evaluated:Patient Re-evaluated prior to induction Oxygen Delivery Method: Circle system utilized Preoxygenation: Pre-oxygenation with 100% oxygen Induction Type: IV induction Ventilation: Mask ventilation without difficulty Laryngoscope Size: Mac and 3 Grade View: Grade I Tube type: Oral Tube size: 7.0 mm Number of attempts: 1 Airway Equipment and Method: Stylet Placement Confirmation: ETT inserted through vocal cords under direct vision,  positive ETCO2,  CO2 detector and breath sounds checked- equal and bilateral Secured at: 22 cm Tube secured with: Tape Dental Injury: Teeth and Oropharynx as per pre-operative assessment

## 2019-11-09 ENCOUNTER — Encounter (HOSPITAL_COMMUNITY): Payer: Self-pay | Admitting: Gastroenterology

## 2020-01-15 ENCOUNTER — Other Ambulatory Visit: Payer: Self-pay | Admitting: Neurology

## 2020-02-08 ENCOUNTER — Other Ambulatory Visit: Payer: Self-pay | Admitting: Cardiovascular Disease

## 2020-02-11 ENCOUNTER — Other Ambulatory Visit: Payer: Self-pay | Admitting: Neurology

## 2020-02-16 ENCOUNTER — Emergency Department
Admission: EM | Admit: 2020-02-16 | Discharge: 2020-02-16 | Disposition: A | Discharge status: Home / Self Care | Payer: Medicare Other | Attending: Emergency Medicine | Admitting: Emergency Medicine

## 2020-02-16 ENCOUNTER — Other Ambulatory Visit: Payer: Self-pay

## 2020-02-16 ENCOUNTER — Encounter: Payer: Self-pay | Admitting: Emergency Medicine

## 2020-02-16 ENCOUNTER — Emergency Department: Payer: Medicare Other

## 2020-02-16 DIAGNOSIS — Z79899 Other long term (current) drug therapy: Secondary | ICD-10-CM | POA: Insufficient documentation

## 2020-02-16 DIAGNOSIS — R531 Weakness: Secondary | ICD-10-CM | POA: Diagnosis present

## 2020-02-16 DIAGNOSIS — N390 Urinary tract infection, site not specified: Secondary | ICD-10-CM

## 2020-02-16 DIAGNOSIS — N3090 Cystitis, unspecified without hematuria: Secondary | ICD-10-CM | POA: Insufficient documentation

## 2020-02-16 DIAGNOSIS — G43901 Migraine, unspecified, not intractable, with status migrainosus: Secondary | ICD-10-CM

## 2020-02-16 DIAGNOSIS — E119 Type 2 diabetes mellitus without complications: Secondary | ICD-10-CM | POA: Insufficient documentation

## 2020-02-16 DIAGNOSIS — G43909 Migraine, unspecified, not intractable, without status migrainosus: Secondary | ICD-10-CM | POA: Insufficient documentation

## 2020-02-16 DIAGNOSIS — Z7984 Long term (current) use of oral hypoglycemic drugs: Secondary | ICD-10-CM | POA: Diagnosis not present

## 2020-02-16 DIAGNOSIS — I10 Essential (primary) hypertension: Secondary | ICD-10-CM | POA: Diagnosis not present

## 2020-02-16 LAB — CBC WITH DIFFERENTIAL/PLATELET
Abs Immature Granulocytes: 0.12 10*3/uL — ABNORMAL HIGH (ref 0.00–0.07)
Basophils Absolute: 0 10*3/uL (ref 0.0–0.1)
Basophils Relative: 0 %
Eosinophils Absolute: 0 10*3/uL (ref 0.0–0.5)
Eosinophils Relative: 0 %
HCT: 33.1 % — ABNORMAL LOW (ref 36.0–46.0)
Hemoglobin: 10.8 g/dL — ABNORMAL LOW (ref 12.0–15.0)
Immature Granulocytes: 1 %
Lymphocytes Relative: 6 %
Lymphs Abs: 0.6 10*3/uL — ABNORMAL LOW (ref 0.7–4.0)
MCH: 30.1 pg (ref 26.0–34.0)
MCHC: 32.6 g/dL (ref 30.0–36.0)
MCV: 92.2 fL (ref 80.0–100.0)
Monocytes Absolute: 0.7 10*3/uL (ref 0.1–1.0)
Monocytes Relative: 8 %
Neutro Abs: 8 10*3/uL — ABNORMAL HIGH (ref 1.7–7.7)
Neutrophils Relative %: 85 %
Platelets: 139 10*3/uL — ABNORMAL LOW (ref 150–400)
RBC: 3.59 MIL/uL — ABNORMAL LOW (ref 3.87–5.11)
RDW: 13.4 % (ref 11.5–15.5)
WBC: 9.5 10*3/uL (ref 4.0–10.5)
nRBC: 0 % (ref 0.0–0.2)

## 2020-02-16 LAB — COMPREHENSIVE METABOLIC PANEL
ALT: 77 U/L — ABNORMAL HIGH (ref 0–44)
AST: 95 U/L — ABNORMAL HIGH (ref 15–41)
Albumin: 3.2 g/dL — ABNORMAL LOW (ref 3.5–5.0)
Alkaline Phosphatase: 181 U/L — ABNORMAL HIGH (ref 38–126)
Anion gap: 14 (ref 5–15)
BUN: 39 mg/dL — ABNORMAL HIGH (ref 6–20)
CO2: 17 mmol/L — ABNORMAL LOW (ref 22–32)
Calcium: 8.1 mg/dL — ABNORMAL LOW (ref 8.9–10.3)
Chloride: 107 mmol/L (ref 98–111)
Creatinine, Ser: 1.08 mg/dL — ABNORMAL HIGH (ref 0.44–1.00)
GFR calc Af Amer: >60 mL/min (ref >60)
GFR calc non Af Amer: 58 mL/min — ABNORMAL LOW (ref >60)
Glucose, Bld: 339 mg/dL — ABNORMAL HIGH (ref 70–99)
Potassium: 3.9 mmol/L (ref 3.5–5.1)
Sodium: 138 mmol/L (ref 135–145)
Total Bilirubin: 1.1 mg/dL (ref 0.3–1.2)
Total Protein: 6.8 g/dL (ref 6.5–8.1)

## 2020-02-16 LAB — URINALYSIS, COMPLETE (UACMP) WITH MICROSCOPIC
Bilirubin Urine: NEGATIVE
Glucose, UA: >=500 mg/dL — AB
Ketones, ur: 5 mg/dL — AB
Nitrite: NEGATIVE
Protein, ur: NEGATIVE mg/dL
Specific Gravity, Urine: 1.023 (ref 1.005–1.030)
WBC, UA: >50 WBC/hpf — ABNORMAL HIGH (ref 0–5)
pH: 5 (ref 5.0–8.0)

## 2020-02-16 LAB — LACTIC ACID, PLASMA: Lactic Acid, Venous: 1 mmol/L (ref 0.5–1.9)

## 2020-02-16 LAB — TROPONIN I (HIGH SENSITIVITY): Troponin I (High Sensitivity): 10 ng/L (ref <18)

## 2020-02-16 MED ORDER — KETOROLAC TROMETHAMINE 30 MG/ML IJ SOLN
30.0000 mg | Freq: Once | INTRAMUSCULAR | Status: AC
  Administered 2020-02-16: 30 mg via INTRAVENOUS
  Filled 2020-02-16: qty 1

## 2020-02-16 MED ORDER — CEFDINIR 300 MG PO CAPS: 300.0000 mg | ORAL_CAPSULE | Freq: Two times a day (BID) | ORAL | 0 refills | Status: AC

## 2020-02-16 MED ORDER — DEXAMETHASONE SODIUM PHOSPHATE 10 MG/ML IJ SOLN
10.0000 mg | Freq: Once | INTRAMUSCULAR | Status: AC
  Administered 2020-02-16: 10 mg via INTRAVENOUS
  Filled 2020-02-16: qty 1

## 2020-02-16 MED ORDER — DIPHENHYDRAMINE HCL 50 MG/ML IJ SOLN
50.0000 mg | Freq: Once | INTRAMUSCULAR | Status: AC
  Administered 2020-02-16: 50 mg via INTRAVENOUS
  Filled 2020-02-16: qty 1

## 2020-02-16 MED ORDER — SODIUM CHLORIDE 0.9 % IV BOLUS
1000.0000 mL | Freq: Once | INTRAVENOUS | Status: AC
  Administered 2020-02-16: 1000 mL via INTRAVENOUS

## 2020-02-16 MED ORDER — ONDANSETRON HCL 4 MG/2ML IJ SOLN
4.0000 mg | Freq: Once | INTRAMUSCULAR | Status: AC
  Administered 2020-02-16: 4 mg via INTRAVENOUS
  Filled 2020-02-16: qty 2

## 2020-02-16 MED ORDER — SODIUM CHLORIDE 0.9 % IV SOLN
1.0000 g | Freq: Once | INTRAVENOUS | Status: AC
  Administered 2020-02-16: 1 g via INTRAVENOUS
  Filled 2020-02-16: qty 10

## 2020-02-16 NOTE — ED Notes (Signed)
Pt assisted to the bathroom by this RN. Repeat VS obtained by this RN and Radonna Ricker, RN. Warm blanket provided to patient by screener. Pt back to lobby in wheelchair without incident.

## 2020-02-16 NOTE — ED Triage Notes (Signed)
Pt to triage via w/c with no distress noted; pt st that she got up to go to [place] and fell; st for last 21mo having HA (has seen PCP for such and dx with migraines); pt reports rt knee pain after fall; pt denies any recent illness but reports has had recent dizziness and weakness; pt st hx HA

## 2020-02-16 NOTE — ED Provider Notes (Signed)
[facility] Emergency Department Provider Note   ____________________________________________   First MD Initiated Contact with Patient 02/16/20 1332     (approximate)  I have reviewed the triage vital signs and the nursing notes.   HISTORY  Chief Complaint No chief complaint on file.    HPI Kim Hicks is a 54 y.o. female with a stated past medical history of anxiety, migraines, hypertension, cirrhosis, and neuropathy who presents for multiple complaints including generalized weakness, dysuria, and migraine headache.  Patient states that she has had a migraine for the last 2 months and was started on Topamax and Fioricet with little relief in her pain.  Patient describes 8/10 throbbing, nonradiating headache that is worse in bright light or with loud noise.  Patient also complains of 1 week of worsening generalized weakness with associated dysuria.         Past Medical History:  Diagnosis Date  . Anxiety   . Cataract   . Cirrhosis of liver ([place])   . Depression   . Diabetes mellitus without complication ([place])   . Dysrhythmia   . Edema   . Family history of adverse reaction to anesthesia    mother had trouble waking up after surgery  . Foot fracture, left   . Headache    MIGRAINES  . Hyperlipidemia   . Hypertension   . Neuromuscular disorder ([place])    NEUROPATHY-both hands and feet  . Palpitations   . Wheezing     Patient Active Problem List   Diagnosis Date Noted  . Encounter for removal of biliary stent   . Diabetic peripheral neuropathy ([place]) 09/14/2019  . Gait disturbance 09/14/2019  . NASH (nonalcoholic steatohepatitis) 08/21/2019  . Bile leak   . Other cirrhosis of liver ([place]) 08/04/2019  . DYSPHAGIA UNSPECIFIED 03/04/2009  . GERD 02/18/2009  . PERSISTENT VOMITING 02/18/2009  . CHEST PAIN 02/18/2009  . NAUSEA AND VOMITING 02/18/2009  . DEHYDRATION 07/19/2007  . GASTROENTERITIS 07/19/2007  . FIBROIDS, UTERUS 05/02/2007  .  DEPRESSION 05/02/2007  . MIGRAINE HEADACHE 05/02/2007  . Essential hypertension 05/02/2007  . HEMORRHOIDS 05/02/2007  . Chronic constipation 05/02/2007  . IBS 05/02/2007  . PSORIASIS 05/02/2007  . PITUITARY NEOPLASM, HX OF 05/02/2007  . LIVER FUNCTION TESTS, ABNORMAL, HX OF 05/02/2007  . DIABETES MELLITUS, TYPE II 02/04/2007  . HYPERLIPIDEMIA 02/04/2007  . SINUSITIS, ACUTE 02/04/2007  . ABDOMINAL PAIN, RIGHT UPPER QUADRANT 02/04/2007    Past Surgical History:  Procedure Laterality Date  . ABDOMINAL HYSTERECTOMY     one ovary left  . BILIARY STENT PLACEMENT N/A 08/07/2019   Procedure: BILIARY STENT PLACEMENT;  Surgeon: [name], MD;  Location: [facility];  Service: Endoscopy;  Laterality: N/A;  . BILIARY STENT PLACEMENT N/A 11/07/2019   Procedure: BILIARY STENT PLACEMENT;  Surgeon: [name], MD;  Location: [facility];  Service: Endoscopy;  Laterality: N/A;  . CATARACT EXTRACTION W/PHACO Right 01/11/2018   Procedure: CATARACT EXTRACTION PHACO AND INTRAOCULAR LENS PLACEMENT (IOC);  Surgeon: [name], MD;  Location: [facility] ORS;  Service: Ophthalmology;  Laterality: Right;  [place] 00:57.5 AP% 15.0 CDE 8.61 Fluid Pack Lot # [id number]  . CATARACT EXTRACTION W/PHACO Left 02/15/2018   Procedure: CATARACT EXTRACTION PHACO AND INTRAOCULAR LENS PLACEMENT (IOC);  Surgeon: [name], MD;  Location: [facility] ORS;  Service: Ophthalmology;  Laterality: Left;  [place] 00:51 AP% 13.2 CDE 6.82 Fluid p ack lot # [id number]  . CHOLECYSTECTOMY N/A 08/04/2019   Procedure: LAPAROSCOPIC CHOLECYSTECTOMY;  Surgeon: Romana Juniper  A, MD;  Location: [facility];  Service: General;  Laterality: N/A;  . COLONOSCOPY    . ENDOSCOPIC RETROGRADE CHOLANGIOPANCREATOGRAPHY (ERCP) WITH PROPOFOL N/A 11/07/2019   Procedure: ENDOSCOPIC RETROGRADE CHOLANGIOPANCREATOGRAPHY (ERCP) WITH PROPOFOL;  Surgeon: [name], MD;  Location: [facility];  Service: Endoscopy;  Laterality: N/A;  . ERCP N/A 08/07/2019    Procedure: ENDOSCOPIC RETROGRADE CHOLANGIOPANCREATOGRAPHY (ERCP);  Surgeon: [name], MD;  Location: [facility];  Service: Endoscopy;  Laterality: N/A;  . RCR Bilateral   . REMOVAL OF STONES  08/07/2019   Procedure: REMOVAL OF STONES;  Surgeon: [name], MD;  Location: [facility];  Service: Endoscopy;;  balloon sweep, no stones  . SPHINCTEROTOMY  08/07/2019   Procedure: SPHINCTEROTOMY;  Surgeon: [name], MD;  Location: [facility];  Service: Endoscopy;;  . STENT REMOVAL  11/07/2019   Procedure: STENT REMOVAL;  Surgeon: [name], MD;  Location: [facility];  Service: Endoscopy;;    Prior to Admission medications   Medication Sig Start Date End Date Taking? Authorizing Provider  calcium carbonate (OSCAL) 1500 (600 Ca) MG TABS tablet Take 600 mg of elemental calcium by mouth daily with breakfast.    [provider]  cefdinir (OMNICEF) 300 MG capsule Take 1 capsule (300 mg total) by mouth 2 (two) times daily for 5 days. 02/16/20 02/21/20  Naaman Plummer, MD  Empagliflozin-metFORMIN HCl ER (SYNJARDY XR) 25-1000 MG TB24 Take 1 tablet by mouth daily before breakfast.  11/15/18   [provider]  lidocaine (LMX) 4 % cream Apply 1 application topically as needed (apply to knee for pain prn).    [provider]  LINZESS 290 MCG CAPS capsule Take 290 mcg by mouth every morning. 08/16/19   [provider]  lisinopril (ZESTRIL) 20 MG tablet TAKE 1 TABLET BY MOUTH EVERY DAY 02/08/20   [name], MD  meloxicam (MOBIC) 15 MG tablet Take 15 mg by mouth daily as needed for pain.    [provider]  Omega-3 Fatty Acids (OMEGA 3 PO) Take 500 mg by mouth daily.     [provider]  oxyCODONE-acetaminophen (PERCOCET/ROXICET) 5-325 MG tablet Take 1-2 tablets by mouth every 4 (four) hours as needed for severe pain. 11/07/19   Volanda Napoleon, PA-C  prazosin (MINIPRESS) 2 MG capsule Take 4 mg by mouth at bedtime.    [provider]  pregabalin (LYRICA) 100 MG capsule TAKE 1 CAPSULE BY MOUTH THREE TIMES A DAY 02/12/20   [name], NP  QUEtiapine (SEROQUEL) 400 MG tablet Take 800 mg by mouth at bedtime.    [provider]  QUEtiapine (SEROQUEL) 50 MG tablet Take 100 mg by mouth daily before breakfast.     [provider]  simvastatin (ZOCOR) 10 MG tablet Take 10 mg by mouth daily before breakfast.  12/13/17   [provider]  traMADol ([name]) 50 MG tablet Take 50 mg by mouth daily as needed for moderate pain.  01/13/19   [provider]  TRULICITY 3 PN/3.6RW SOPN Inject 1.75 mg into the skin once a week. Monday 12/20/18   [provider]  vortioxetine HBr ([name]) 20 MG TABS tablet Take 20 mg by mouth at bedtime.    [provider]  ziprasidone ([name]) 20 MG capsule Take 20 mg by mouth at bedtime.    [provider]    Allergies Talwin [pentazocine]  Family History  Problem Relation Age of Onset  . Diabetes Mother   .  Heart failure Mother   . Depression Mother   . Colon cancer Neg Hx   . Stomach cancer Neg Hx   . Esophageal cancer Neg Hx   . Pancreatic cancer Neg Hx     Social History Social History   Tobacco Use  . Smoking status: Never Smoker  . Smokeless tobacco: Never Used  Vaping Use  . Vaping Use: Never used  Substance Use Topics  . Alcohol use: Never  . Drug use: Never    Review of Systems Constitutional: No fever/chills Eyes: No visual changes. ENT: No sore throat. Cardiovascular: Denies chest pain. Respiratory: Denies shortness of breath. Gastrointestinal: No abdominal pain.  No nausea, no vomiting.  No diarrhea. Genitourinary: Negative for dysuria. Musculoskeletal: Negative for acute arthralgias Skin: Negative for rash. Neurological: Negative numbness/paresthesias in any extremity Psychiatric: Negative for suicidal ideation/homicidal  ideation   ____________________________________________   PHYSICAL EXAM:  VITAL SIGNS: ED Triage Vitals [02/16/20 0508]  Enc Vitals Group     BP (!) 142/104     Pulse Rate (!) 110     Resp 18     Temp 99.4 F (37.4 C)     Temp Source Oral     SpO2 100 %     Weight 180 lb (81.6 kg)     Height 5' 5"  (1.651 m)     Head Circumference      Peak Flow      Pain Score 8     Pain Loc      Pain Edu?      Excl. in [place]?    Constitutional: Alert and oriented. Well appearing and in no acute distress. Eyes: Conjunctivae are normal. PERRL. Head: Atraumatic. Nose: No congestion/rhinnorhea. Mouth/Throat: Mucous membranes are moist. Neck: No stridor Cardiovascular: Normal rate, regular rhythm. Grossly normal heart sounds.  Good peripheral circulation. Respiratory: Normal respiratory effort.  No retractions. Gastrointestinal: Soft and nontender. No distention. Musculoskeletal: No lower extremity tenderness nor edema.  No joint effusions. Neurologic:  Normal speech and language. No gross focal neurologic deficits are appreciated. Skin:  Skin is warm and dry. No rash noted. Psychiatric: Mood and affect are normal. Speech and behavior are normal.  ____________________________________________   LABS (all labs ordered are listed, but only abnormal results are displayed)  Labs Reviewed  COMPREHENSIVE METABOLIC PANEL - Abnormal; Notable for the following components:      Result Value   CO2 17 (*)    Glucose, Bld 339 (*)    BUN 39 (*)    Creatinine, Ser 1.08 (*)    Calcium 8.1 (*)    Albumin 3.2 (*)    AST 95 (*)    ALT 77 (*)    Alkaline Phosphatase 181 (*)    GFR calc non Af Amer 58 (*)    All other components within normal limits  URINALYSIS, COMPLETE (UACMP) WITH MICROSCOPIC - Abnormal; Notable for the following components:   Color, Urine YELLOW (*)    APPearance CLOUDY (*)    Glucose, UA >=500 (*)    Hgb urine dipstick SMALL (*)    Ketones, ur 5 (*)    Leukocytes,Ua LARGE  (*)    WBC, UA >50 (*)    Bacteria, UA RARE (*)    All other components within normal limits  CBC WITH DIFFERENTIAL/PLATELET - Abnormal; Notable for the following components:   RBC 3.59 (*)    Hemoglobin 10.8 (*)    HCT 33.1 (*)    Platelets 139 (*)    Neutro  Abs 8.0 (*)    Lymphs Abs 0.6 (*)    Abs Immature Granulocytes 0.12 (*)    All other components within normal limits  LACTIC ACID, PLASMA  CBC WITH DIFFERENTIAL/PLATELET  TROPONIN I (HIGH SENSITIVITY)   ____________________________________________  EKG  ED ECG REPORT I, Naaman Plummer, the attending physician, personally viewed and interpreted this ECG.  Date: 02/16/2020 EKG Time: 0512 Rate: 111 Rhythm: Tachycardic sinus rhythm QRS Axis: normal Intervals: normal ST/T Wave abnormalities: normal Narrative Interpretation: no evidence of acute ischemia  ____________________________________________  RADIOLOGY  ED MD interpretation: CT of the head without contrast shows no evidence of of acute abnormalities to include active bleeding, skull fracture, or hydrocephalus 4 view x-ray of the right knee shows no evidence of fracture, dislocation, or joint effusion  Official radiology report(s): CT Head Wo Contrast  Result Date: 02/16/2020 CLINICAL DATA:  Fall, headache EXAM: CT HEAD WITHOUT CONTRAST TECHNIQUE: Contiguous axial images were obtained from the base of the skull through the vertex without intravenous contrast. COMPARISON:  None. FINDINGS: Brain: Normal anatomic configuration. No abnormal intra or extra-axial mass lesion or fluid collection. No abnormal mass effect or midline shift. No evidence of acute intracranial hemorrhage or infarct. Ventricular size is normal. Cerebellum unremarkable. Vascular: Unremarkable Skull: Intact Sinuses/Orbits: Paranasal sinuses are clear. Orbits are unremarkable. Other: Mastoid air cells and middle ear cavities are clear. IMPRESSION: No acute intracranial abnormality.  No calvarial  fracture. Electronically Signed   By: Fidela Salisbury MD   On: 02/16/2020 06:26   DG Knee Complete 4 Views Right  Result Date: 02/16/2020 CLINICAL DATA:  Fall, right knee pain EXAM: RIGHT KNEE - COMPLETE 4+ VIEW COMPARISON:  None. FINDINGS: No evidence of fracture, dislocation, or joint effusion. No evidence of arthropathy or other focal bone abnormality. Soft tissues are unremarkable. IMPRESSION: Negative. Electronically Signed   By: Fidela Salisbury MD   On: 02/16/2020 06:27    ____________________________________________   PROCEDURES  Procedure(s) performed (including Critical Care):  Procedures   ____________________________________________   INITIAL IMPRESSION / ASSESSMENT AND PLAN / ED COURSE  As part of my medical decision making, I reviewed the following data within the [place] notes reviewed and incorporated, Labs reviewed, EKG interpreted, Old chart reviewed, Radiograph reviewed and Notes from prior ED visits reviewed and incorporated     presents with Headache.  No focal neurological symptoms. Neuro exam is benign. Pt is nontoxic. VSS.  Based on history and normal neurological exam I have low suspicion for intracranial tumor, intracranial bleed, meningitis, temporal arteritis, glaucoma, CO poisoning.  Most likely patient has benign headache/migraine, recommend rest, hydration, and ibuprofen.   Unlikely TOA, Ovarian Torsion, PID, gonorrhea/chlamydia. Low suspicion for Infected Urolithiasis, AAA, Cholecystitis, Pancreatitis, SBO, Appendicitis, or other acute abdomen.  Rx: cefdinir 325m BID for 5 days Disposition: Discharge home. SRP discussed. Advise follow up with primary care provider within 24-72 hours.     ____________________________________________   FINAL CLINICAL IMPRESSION(S) / ED DIAGNOSES  Final diagnoses:  Migraine with status migrainosus, not intractable, unspecified migraine type  Urinary tract infection without  hematuria, site unspecified     ED Discharge Orders         Ordered    cefdinir (OMNICEF) 300 MG capsule  2 times daily        02/16/20 1433           Note:  This document was prepared using Dragon voice recognition software and may include unintentional dictation errors.   BCheri Fowler  Vista Lawman, MD 02/16/20 [phone]

## 2020-02-20 ENCOUNTER — Ambulatory Visit: Payer: Medicare Other | Admitting: Neurology

## 2020-02-23 ENCOUNTER — Telehealth: Payer: Self-pay

## 2020-02-23 NOTE — Telephone Encounter (Signed)
-----   Message from [name], MD sent at 02/13/2020 12:32 PM EDT ----- This patient needs ERCP stent removal/replacement scheduled in next couple months. Stent was replaced for a persistent bile leak in 10/2019. She has a colonoscopy scheduled in [place] on 10/1.

## 2020-02-26 ENCOUNTER — Other Ambulatory Visit: Payer: Self-pay

## 2020-02-26 DIAGNOSIS — Z4689 Encounter for fitting and adjustment of other specified devices: Secondary | ICD-10-CM

## 2020-02-26 DIAGNOSIS — K839 Disease of biliary tract, unspecified: Secondary | ICD-10-CM

## 2020-02-26 NOTE — Addendum Note (Signed)
Addended by: Marlon Pel on: 02/26/2020 11:12 AM   Modules accepted: Orders

## 2020-02-26 NOTE — Telephone Encounter (Signed)
Patient scheduled for ERCP stent removal/replacement on 05/13/20 at 7:30am at [facility]. Mailed patient's instructions to home address. Informed patient if she does not receive instructions in a week to call our office. Also informed her of covid test that will be on paperwork. Patient verbalized understanding.

## 2020-02-26 NOTE — Telephone Encounter (Signed)
Left message for patient to return my call.

## 2020-03-11 DIAGNOSIS — N390 Urinary tract infection, site not specified: Secondary | ICD-10-CM | POA: Insufficient documentation

## 2020-03-15 ENCOUNTER — Telehealth: Payer: Self-pay | Admitting: Gastroenterology

## 2020-03-15 NOTE — Telephone Encounter (Signed)
Dr. Fuller Plan, okay for colon here before her ERCP in Dec?

## 2020-03-15 NOTE — Telephone Encounter (Signed)
OK to schedule colonoscopy before ERCP. She needs a 2 day bowel prep. See 05/2019 colonoscopy report.

## 2020-05-08 ENCOUNTER — Other Ambulatory Visit: Payer: Self-pay

## 2020-05-09 ENCOUNTER — Other Ambulatory Visit (HOSPITAL_COMMUNITY)
Admission: RE | Admit: 2020-05-09 | Discharge: 2020-05-09 | Disposition: A | Discharge status: Home / Self Care | Payer: Medicare Other | Source: Ambulatory Visit | Attending: Gastroenterology | Admitting: Gastroenterology

## 2020-05-09 DIAGNOSIS — Z20822 Contact with and (suspected) exposure to covid-19: Secondary | ICD-10-CM | POA: Diagnosis not present

## 2020-05-09 DIAGNOSIS — Z01812 Encounter for preprocedural laboratory examination: Secondary | ICD-10-CM | POA: Diagnosis present

## 2020-05-09 LAB — SARS CORONAVIRUS 2 (TAT 6-24 HRS): SARS Coronavirus 2: NEGATIVE

## 2020-05-10 ENCOUNTER — Encounter: Payer: Self-pay | Admitting: Gastroenterology

## 2020-05-10 NOTE — Telephone Encounter (Signed)
Left a few message to call back when we had the availability but did not return my calls. Sent a recall letter in the mail.

## 2020-05-13 ENCOUNTER — Encounter (HOSPITAL_COMMUNITY)
Admission: RE | Disposition: A | Discharge status: Home / Self Care | Payer: Self-pay | Source: Home / Self Care | Attending: Gastroenterology

## 2020-05-13 ENCOUNTER — Ambulatory Visit (HOSPITAL_COMMUNITY): Payer: Medicare Other

## 2020-05-13 ENCOUNTER — Other Ambulatory Visit: Payer: Self-pay

## 2020-05-13 ENCOUNTER — Ambulatory Visit (HOSPITAL_COMMUNITY): Payer: Medicare Other | Admitting: Certified Registered Nurse Anesthetist

## 2020-05-13 ENCOUNTER — Ambulatory Visit (HOSPITAL_COMMUNITY)
Admission: RE | Admit: 2020-05-13 | Discharge: 2020-05-13 | Disposition: A | Discharge status: Home / Self Care | Payer: Medicare Other | Attending: Gastroenterology | Admitting: Gastroenterology

## 2020-05-13 ENCOUNTER — Encounter (HOSPITAL_COMMUNITY): Payer: Self-pay | Admitting: Gastroenterology

## 2020-05-13 DIAGNOSIS — Z7984 Long term (current) use of oral hypoglycemic drugs: Secondary | ICD-10-CM | POA: Insufficient documentation

## 2020-05-13 DIAGNOSIS — K5900 Constipation, unspecified: Secondary | ICD-10-CM | POA: Diagnosis not present

## 2020-05-13 DIAGNOSIS — Z79899 Other long term (current) drug therapy: Secondary | ICD-10-CM | POA: Diagnosis not present

## 2020-05-13 DIAGNOSIS — K838 Other specified diseases of biliary tract: Secondary | ICD-10-CM | POA: Diagnosis present

## 2020-05-13 DIAGNOSIS — Z4659 Encounter for fitting and adjustment of other gastrointestinal appliance and device: Secondary | ICD-10-CM | POA: Insufficient documentation

## 2020-05-13 DIAGNOSIS — Z794 Long term (current) use of insulin: Secondary | ICD-10-CM | POA: Diagnosis not present

## 2020-05-13 DIAGNOSIS — Z4689 Encounter for fitting and adjustment of other specified devices: Secondary | ICD-10-CM

## 2020-05-13 DIAGNOSIS — Z888 Allergy status to other drugs, medicaments and biological substances status: Secondary | ICD-10-CM | POA: Insufficient documentation

## 2020-05-13 DIAGNOSIS — K839 Disease of biliary tract, unspecified: Secondary | ICD-10-CM

## 2020-05-13 HISTORY — PX: STENT REMOVAL: SHX6421

## 2020-05-13 HISTORY — PX: ENDOSCOPIC RETROGRADE CHOLANGIOPANCREATOGRAPHY (ERCP) WITH PROPOFOL: SHX5810

## 2020-05-13 HISTORY — PX: BILIARY STENT PLACEMENT: SHX5538

## 2020-05-13 LAB — GLUCOSE, CAPILLARY: Glucose-Capillary: 88 mg/dL (ref 70–99)

## 2020-05-13 SURGERY — ENDOSCOPIC RETROGRADE CHOLANGIOPANCREATOGRAPHY (ERCP) WITH PROPOFOL
Anesthesia: General

## 2020-05-13 MED ORDER — ROCURONIUM BROMIDE 10 MG/ML (PF) SYRINGE
PREFILLED_SYRINGE | INTRAVENOUS | Status: DC | PRN
  Administered 2020-05-13: 80 mg via INTRAVENOUS

## 2020-05-13 MED ORDER — MIDAZOLAM HCL 2 MG/2ML IJ SOLN
INTRAMUSCULAR | Status: AC
  Filled 2020-05-13: qty 2

## 2020-05-13 MED ORDER — DEXAMETHASONE SODIUM PHOSPHATE 10 MG/ML IJ SOLN
INTRAMUSCULAR | Status: DC | PRN
  Administered 2020-05-13: 10 mg via INTRAVENOUS

## 2020-05-13 MED ORDER — LACTATED RINGERS IV SOLN: INTRAVENOUS | Status: DC

## 2020-05-13 MED ORDER — SODIUM CHLORIDE 0.9 % IV SOLN
INTRAVENOUS | Status: DC | PRN
  Administered 2020-05-13: 40 mL

## 2020-05-13 MED ORDER — PHENYLEPHRINE HCL-NACL 10-0.9 MG/250ML-% IV SOLN
INTRAVENOUS | Status: DC | PRN
  Administered 2020-05-13: 35 ug/min via INTRAVENOUS

## 2020-05-13 MED ORDER — GLUCAGON HCL RDNA (DIAGNOSTIC) 1 MG IJ SOLR
INTRAMUSCULAR | Status: AC
  Filled 2020-05-13: qty 1

## 2020-05-13 MED ORDER — INDOMETHACIN 50 MG RE SUPP
RECTAL | Status: AC
  Filled 2020-05-13: qty 1

## 2020-05-13 MED ORDER — FENTANYL CITRATE (PF) 100 MCG/2ML IJ SOLN
INTRAMUSCULAR | Status: AC
  Filled 2020-05-13: qty 2

## 2020-05-13 MED ORDER — PHENYLEPHRINE 40 MCG/ML (10ML) SYRINGE FOR IV PUSH (FOR BLOOD PRESSURE SUPPORT)
PREFILLED_SYRINGE | INTRAVENOUS | Status: DC | PRN
  Administered 2020-05-13: 80 ug via INTRAVENOUS

## 2020-05-13 MED ORDER — LIDOCAINE 2% (20 MG/ML) 5 ML SYRINGE
INTRAMUSCULAR | Status: DC | PRN
  Administered 2020-05-13: 60 mg via INTRAVENOUS

## 2020-05-13 MED ORDER — MIDAZOLAM HCL 5 MG/5ML IJ SOLN
INTRAMUSCULAR | Status: DC | PRN
  Administered 2020-05-13: 2 mg via INTRAVENOUS

## 2020-05-13 MED ORDER — INDOMETHACIN 50 MG RE SUPP
RECTAL | Status: DC | PRN
  Administered 2020-05-13: 100 mg via RECTAL

## 2020-05-13 MED ORDER — SODIUM CHLORIDE 0.9 % IV SOLN: INTRAVENOUS | Status: DC

## 2020-05-13 MED ORDER — PROPOFOL 10 MG/ML IV BOLUS
INTRAVENOUS | Status: AC
  Filled 2020-05-13: qty 20

## 2020-05-13 MED ORDER — GLUCAGON HCL RDNA (DIAGNOSTIC) 1 MG IJ SOLR
INTRAMUSCULAR | Status: DC | PRN
  Administered 2020-05-13 (×3): .25 mg via INTRAVENOUS

## 2020-05-13 MED ORDER — AMPICILLIN-SULBACTAM SODIUM 1.5 (1-0.5) G IJ SOLR
1.5000 g | Freq: Once | INTRAMUSCULAR | Status: AC
  Administered 2020-05-13: 1.5 g via INTRAVENOUS
  Filled 2020-05-13 (×3): qty 4

## 2020-05-13 MED ORDER — PROPOFOL 10 MG/ML IV BOLUS
INTRAVENOUS | Status: DC | PRN
  Administered 2020-05-13: 120 mg via INTRAVENOUS

## 2020-05-13 MED ORDER — ONDANSETRON HCL 4 MG/2ML IJ SOLN
INTRAMUSCULAR | Status: DC | PRN
  Administered 2020-05-13: 4 mg via INTRAVENOUS

## 2020-05-13 MED ORDER — FENTANYL CITRATE (PF) 100 MCG/2ML IJ SOLN
INTRAMUSCULAR | Status: DC | PRN
  Administered 2020-05-13 (×2): 50 ug via INTRAVENOUS

## 2020-05-13 MED ORDER — INDOMETHACIN 50 MG RE SUPP: 50.0000 mg | Freq: Once | RECTAL | Status: DC

## 2020-05-13 NOTE — Anesthesia Preprocedure Evaluation (Signed)
Anesthesia Evaluation  Patient identified by MRN, date of birth, ID band Patient awake    Reviewed: Allergy & Precautions, NPO status , Patient's Chart, lab work & pertinent test results  Airway Mallampati: II  TM Distance: >3 FB Neck ROM: Full    Dental no notable dental hx.    Pulmonary neg pulmonary ROS,    Pulmonary exam normal breath sounds clear to auscultation       Cardiovascular hypertension, Normal cardiovascular exam Rhythm:Regular Rate:Normal     Neuro/Psych  Headaches, PSYCHIATRIC DISORDERS Anxiety Depression    GI/Hepatic GERD  Controlled,(+) Cirrhosis  ([facility])      ,   Endo/Other  negative endocrine ROSdiabetes, Type 2  Renal/GU negative Renal ROS  negative genitourinary   Musculoskeletal negative musculoskeletal ROS (+)   Abdominal   Peds  Hematology negative hematology ROS (+)   Anesthesia Other Findings ERCP for bile leak after lap chole march 2021.   Reproductive/Obstetrics                             Anesthesia Physical Anesthesia Plan  ASA: III  Anesthesia Plan: General   Post-op Pain Management:    Induction: Intravenous  PONV Risk Score and Plan: Midazolam, Dexamethasone and Ondansetron  Airway Management Planned: Oral ETT  Additional Equipment:   Intra-op Plan:   Post-operative Plan: Extubation in OR  Informed Consent: I have reviewed the patients History and Physical, chart, labs and discussed the procedure including the risks, benefits and alternatives for the proposed anesthesia with the patient or authorized representative who has indicated his/her understanding and acceptance.     Dental advisory given  Plan Discussed with: CRNA  Anesthesia Plan Comments:         Anesthesia Quick Evaluation

## 2020-05-13 NOTE — Transfer of Care (Signed)
Immediate Anesthesia Transfer of Care Note  Patient: Kim Hicks  Procedure(s) Performed: ENDOSCOPIC RETROGRADE CHOLANGIOPANCREATOGRAPHY (ERCP) WITH PROPOFOL (N/A ) BILIARY STENT PLACEMENT (N/A ) STENT REMOVAL  Patient Location: PACU and Endoscopy Unit  Anesthesia Type:General  Level of Consciousness: awake, alert  and oriented  Airway & Oxygen Therapy: Patient Spontanous Breathing and Patient connected to face mask oxygen  Post-op Assessment: Report given to RN and Post -op Vital signs reviewed and stable  Post vital signs: Reviewed and stable  Last Vitals:  Vitals Value Taken Time  BP 168/79 05/13/20 0843  Temp 36.6 C 05/13/20 0843  Pulse 98 05/13/20 0843  Resp 16 05/13/20 0843  SpO2 100 % 05/13/20 0843    Last Pain:  Vitals:   05/13/20 0843  TempSrc: Oral  PainSc: 4          Complications: No complications documented.

## 2020-05-13 NOTE — H&P (Signed)
ADMISSION NOTE  Primary Care Physician:  [name], FNP Primary Gastroenterologist:    Jerilynn Mages. Fuller Plan, MD  CHIEF COMPLAINT:  Bile leak for stent removal  HPI: Kim Hicks is a 54 y.o. female with a bile like post lap cholecystectomy in March 2021. ERCP performed with a cystic duct leak noted and an 8.5 fr biliary sent was placed a few days post op. ERCP in June 2021 demonstrated a persistent leak from the cystic duct and the 8.5 fr stent was exchanged for a 10 fr biliary stent. She has ongoing constipation and mild LLQ pain otherwise feels well. She presents for ERCP with biliary stent removal.    Past Medical History:  Diagnosis Date  . Anxiety   . Cataract   . Cirrhosis of liver ([place])   . Depression   . Diabetes mellitus without complication ([place])   . Dysrhythmia   . Edema   . Family history of adverse reaction to anesthesia    mother had trouble waking up after surgery  . Foot fracture, left   . Headache    MIGRAINES  . Hyperlipidemia   . Hypertension   . Neuromuscular disorder ([place])    NEUROPATHY-both hands and feet  . Palpitations   . Wheezing     Past Surgical History:  Procedure Laterality Date  . ABDOMINAL HYSTERECTOMY     one ovary left  . BILIARY STENT PLACEMENT N/A 08/07/2019   Procedure: BILIARY STENT PLACEMENT;  Surgeon: [name], MD;  Location: [facility];  Service: Endoscopy;  Laterality: N/A;  . BILIARY STENT PLACEMENT N/A 11/07/2019   Procedure: BILIARY STENT PLACEMENT;  Surgeon: [name], MD;  Location: [facility];  Service: Endoscopy;  Laterality: N/A;  . CATARACT EXTRACTION W/PHACO Right 01/11/2018   Procedure: CATARACT EXTRACTION PHACO AND INTRAOCULAR LENS PLACEMENT (IOC);  Surgeon: [name], MD;  Location: [facility];  Service: Ophthalmology;  Laterality: Right;  [place] 00:57.5 AP% 15.0 CDE 8.61 Fluid Pack Lot # [id number]  . CATARACT EXTRACTION W/PHACO Left 02/15/2018   Procedure: CATARACT EXTRACTION PHACO AND INTRAOCULAR LENS  PLACEMENT (IOC);  Surgeon: [name], MD;  Location: [facility];  Service: Ophthalmology;  Laterality: Left;  [place] 00:51 AP% 13.2 CDE 6.82 Fluid p ack lot # [id number]  . CHOLECYSTECTOMY N/A 08/04/2019   Procedure: LAPAROSCOPIC CHOLECYSTECTOMY;  Surgeon: [name], MD;  Location: [facility];  Service: General;  Laterality: N/A;  . COLONOSCOPY    . ENDOSCOPIC RETROGRADE CHOLANGIOPANCREATOGRAPHY (ERCP) WITH PROPOFOL N/A 11/07/2019   Procedure: ENDOSCOPIC RETROGRADE CHOLANGIOPANCREATOGRAPHY (ERCP) WITH PROPOFOL;  Surgeon: [name], MD;  Location: [facility];  Service: Endoscopy;  Laterality: N/A;  . ERCP N/A 08/07/2019   Procedure: ENDOSCOPIC RETROGRADE CHOLANGIOPANCREATOGRAPHY (ERCP);  Surgeon: [name], MD;  Location: [facility];  Service: Endoscopy;  Laterality: N/A;  . RCR Bilateral   . REMOVAL OF STONES  08/07/2019   Procedure: REMOVAL OF STONES;  Surgeon: [name], MD;  Location: [facility];  Service: Endoscopy;;  balloon sweep, no stones  . SPHINCTEROTOMY  08/07/2019   Procedure: SPHINCTEROTOMY;  Surgeon: [name], MD;  Location: [facility];  Service: Endoscopy;;  . STENT REMOVAL  11/07/2019   Procedure: STENT REMOVAL;  Surgeon: [name], MD;  Location: [facility];  Service: Endoscopy;;    Prior to Admission medications   Medication Sig Start Date End Date Taking? Authorizing Provider  albuterol (VENTOLIN HFA) 108 (90 Base) MCG/ACT inhaler Inhale 2 puffs into the lungs every 6 (six)  hours as needed. 02/27/20  Yes [provider]  brexpiprazole (REXULTI) 2 MG TABS tablet Take 2 mg by mouth at bedtime.   Yes [provider]  calcium carbonate (OSCAL) 1500 (600 Ca) MG TABS tablet Take 600 mg of elemental calcium by mouth daily with breakfast.   Yes [provider]  Dulaglutide 4.5 MG/0.5ML SOPN Inject 4 mg into the skin once a week. Monday 12/20/18  Yes [provider]  Empagliflozin-metFORMIN HCl ER  (SYNJARDY XR) 25-1000 MG TB24 Take 1 tablet by mouth daily before breakfast.  11/15/18  Yes [provider]  ezetimibe (ZETIA) 10 MG tablet Take 10 mg by mouth daily. 04/25/20  Yes [provider]  ferrous sulfate 325 (65 FE) MG tablet Take 325 mg by mouth daily with breakfast.   Yes [provider]  Insulin Glargine (BASAGLAR KWIKPEN) 100 UNIT/ML Inject 28 Units into the skin at bedtime. 03/20/20  Yes [provider]  LINZESS 290 MCG CAPS capsule Take 290 mcg by mouth every morning. 08/16/19  Yes [provider]  metFORMIN (GLUCOPHAGE) 1000 MG tablet Take 1,000 mg by mouth at bedtime. 02/27/20  Yes [provider]  Omega-3 Fatty Acids (OMEGA 3 PO) Take 500 mg by mouth daily.    Yes [provider]  pantoprazole (PROTONIX) 20 MG tablet Take 20 mg by mouth daily. 02/27/20  Yes [provider]  prazosin (MINIPRESS) 2 MG capsule Take 4 mg by mouth at bedtime.   Yes [provider]  pregabalin (LYRICA) 100 MG capsule TAKE 1 CAPSULE BY MOUTH THREE TIMES A DAY Patient taking differently: Take 100 mg by mouth 3 (three) times daily. 02/12/20  Yes [name], NP  QUEtiapine (SEROQUEL) 400 MG tablet Take 800 mg by mouth at bedtime.   Yes [provider]  simvastatin (ZOCOR) 10 MG tablet Take 10 mg by mouth daily before breakfast.  12/13/17  Yes [provider]  topiramate (TOPAMAX) 200 MG tablet Take 200 mg by mouth 2 (two) times daily. 04/12/20  Yes [provider]  traMADol ([name]) 50 MG tablet Take 50 mg by mouth daily as needed for moderate pain.  01/13/19  Yes [provider]  vortioxetine HBr ([name]) 20 MG TABS tablet Take 20 mg by mouth at bedtime.   Yes [provider]  ziprasidone ([name]) 20 MG capsule Take 20 mg by mouth at bedtime.   Yes [provider]  lisinopril (ZESTRIL) 20 MG tablet TAKE 1 TABLET BY MOUTH EVERY DAY Patient taking differently: Take 20 mg by  mouth daily. 02/08/20   [name], MD    Current Facility-Administered Medications  Medication Dose Route Frequency Provider Last Rate Last Admin  . ampicillin-sulbactam (UNASYN) injection 1.5 g  1.5 g Intravenous Once [name], MD      . indomethacin (INDOCIN) 50 MG suppository 50 mg  50 mg Rectal Once [name], MD      . lactated ringers infusion   Intravenous Continuous [name], MD        Allergies as of 02/26/2020 - Review Complete 02/16/2020  Allergen Reaction Noted  . Talwin [pentazocine]  02/14/2018    Family History  Problem Relation Age of Onset  . Diabetes Mother   . Heart failure Mother   . Depression Mother   . Colon cancer Neg Hx   . Stomach cancer Neg Hx   . Esophageal cancer Neg Hx   . Pancreatic cancer Neg Hx  Social History   Socioeconomic History  . Marital status: Married    Spouse name: Not on file  . Number of children: Not on file  . Years of education: Not on file  . Highest education level: Not on file  Occupational History  . Not on file  Tobacco Use  . Smoking status: Never Smoker  . Smokeless tobacco: Never Used  Vaping Use  . Vaping Use: Never used  Substance and Sexual Activity  . Alcohol use: Never  . Drug use: Never  . Sexual activity: Not on file  Other Topics Concern  . Not on file  Social History Narrative  . Not on file   Social Determinants of Health   Financial Resource Strain: Not on file  Food Insecurity: Not on file  Transportation Needs: Not on file  Physical Activity: Not on file  Stress: Not on file  Social Connections: Not on file  Intimate Partner Violence: Not on file    Review of Systems:  All systems reviewed an negative except where noted in HPI.  Gen: Denies any fever, chills, sweats, anorexia, fatigue, weakness, malaise, weight loss, and sleep disorder CV: Denies chest pain, angina, palpitations, syncope, orthopnea, PND, peripheral edema, and claudication. Resp:  Denies dyspnea at rest, dyspnea with exercise, cough, sputum, wheezing, coughing up blood, and pleurisy. GI: Denies vomiting blood, jaundice, and fecal incontinence.   Denies dysphagia or odynophagia. GU : Denies urinary burning, blood in urine, urinary frequency, urinary hesitancy, nocturnal urination, and urinary incontinence. MS: Denies joint pain, limitation of movement, and swelling, stiffness, low back pain, extremity pain. Denies muscle weakness, cramps, atrophy.  Derm: Denies rash, itching, dry skin, hives, moles, warts, or unhealing ulcers.  Psych: Denies depression, anxiety, memory loss, suicidal ideation, hallucinations, paranoia, and confusion. Heme: Denies bruising, bleeding, and enlarged lymph nodes. Neuro:  Denies any headaches, dizziness, paresthesias. Endo:  Denies any problems with DM, thyroid, adrenal function.  Physical Exam: Vital signs in last 24 hours: Temp:  [98.7 F (37.1 C)] 98.7 F (37.1 C) (12/20 0706) Pulse Rate:  [87] 87 (12/20 0706) Resp:  [25] 25 (12/20 0706) BP: (139)/(82) 139/82 (12/20 0706) SpO2:  [99 %] 99 % (12/20 0706) Weight:  [77.1 kg] 77.1 kg (12/20 0706)   General:  Alert, well-developed, in NAD Head:  Normocephalic and atraumatic. Eyes:  Sclera clear, no icterus.   Conjunctiva pink. Ears:  Normal auditory acuity. Mouth:  No deformity or lesions.  Neck:  Supple; no masses . Lungs:  Clear throughout to auscultation.   No wheezes, crackles, or rhonchi. No acute distress. Heart:  Regular rate and rhythm; no murmurs. Abdomen:  Soft, nondistended, nontender. No masses, hepatomegaly. No obvious masses.  Normal bowel .    Rectal:  Not done  Msk:  Symmetrical without gross deformities.. Pulses:  Normal pulses noted. Extremities:  Without edema. Neurologic:  Alert and  oriented x4;  grossly normal neurologically. Skin:  Intact without significant lesions or rashes. Cervical Nodes:  No significant cervical adenopathy. Psych:  Alert and cooperative.  Normal mood and affect.   Impression / Plan:   1. Bile leak post lap cholecystectomy in March 2021. An 8.5 fr biliary stent was placed and with a persistent bile leak in June a 10 fr biliary stent was placed. For ERCP to reassess leak and to hopefully remove her stent.   2. CIC. Continue Linzess 290 mcg qd. Add Miralax qd to bid. REV in 1 month.    LOS: 0 days   Norberto Sorenson  Sindy Guadeloupe MD 05/13/2020, 7:41 AM [phone]

## 2020-05-13 NOTE — Discharge Instructions (Signed)
YOU HAD AN ENDOSCOPIC PROCEDURE TODAY: Refer to the procedure report and other information in the discharge instructions given to you for any specific questions about what was found during the examination. If this information does not answer your questions, please call [place] office at [phone] to clarify.   YOU SHOULD EXPECT: Some feelings of bloating in the abdomen. Passage of more gas than usual. Walking can help get rid of the air that was put into your GI tract during the procedure and reduce the bloating. If you had a lower endoscopy (such as a colonoscopy or flexible sigmoidoscopy) you may notice spotting of blood in your stool or on the toilet paper. Some abdominal soreness may be present for a day or two, also.  DIET: Your first meal following the procedure should be a light meal and then it is ok to progress to your normal diet. A half-sandwich or bowl of soup is an example of a good first meal. Heavy or fried foods are harder to digest and may make you feel nauseous or bloated. Drink plenty of fluids but you should avoid alcoholic beverages for 24 hours. If you had a esophageal dilation, please see attached instructions for diet.    ACTIVITY: Your care partner should take you home directly after the procedure. You should plan to take it easy, moving slowly for the rest of the day. You can resume normal activity the day after the procedure however YOU SHOULD NOT DRIVE, use power tools, machinery or perform tasks that involve climbing or major physical exertion for 24 hours (because of the sedation medicines used during the test).   SYMPTOMS TO REPORT IMMEDIATELY: A gastroenterologist can be reached at any hour. Please call [phone]  for any of the following symptoms:   Following upper endoscopy (EGD, EUS, ERCP, esophageal dilation) Vomiting of blood or coffee ground material  New, significant abdominal pain  New, significant chest pain or pain under the shoulder blades  Painful or  persistently difficult swallowing  New shortness of breath  Black, tarry-looking or red, bloody stools  FOLLOW UP:  If any biopsies were taken you will be contacted by phone or by letter within the next 1-3 weeks. Call [phone]  if you have not heard about the biopsies in 3 weeks.  Please also call with any specific questions about appointments or follow up tests.  

## 2020-05-13 NOTE — Anesthesia Procedure Notes (Signed)
Procedure Name: Intubation Date/Time: 05/13/2020 7:53 AM Performed by: Maxwell Caul, CRNA Pre-anesthesia Checklist: Patient identified, Emergency Drugs available, Suction available and Patient being monitored Patient Re-evaluated:Patient Re-evaluated prior to induction Oxygen Delivery Method: Circle system utilized Preoxygenation: Pre-oxygenation with 100% oxygen Induction Type: IV induction Ventilation: Mask ventilation without difficulty Laryngoscope Size: Mac and 4 Grade View: Grade I Tube type: Oral Tube size: 7.5 mm Number of attempts: 1 Airway Equipment and Method: Stylet Placement Confirmation: ETT inserted through vocal cords under direct vision,  positive ETCO2 and breath sounds checked- equal and bilateral Secured at: 21 cm Tube secured with: Tape Dental Injury: Teeth and Oropharynx as per pre-operative assessment

## 2020-05-13 NOTE — Op Note (Signed)
[facility] Patient Name: Kim Hicks Procedure Date: 05/13/2020 MRN: [id number] Attending MD: [name] , MD Date of Birth: 1965/11/04 CSN: [id number] Age: 54 Admit Type: Outpatient Procedure:                ERCP Indications:              Follow-up of bile leak, Biliary stent removal Providers:                Pricilla Riffle. Fuller Plan, MD, Cleda Daub, RN, Laverda Sorenson, Technician, [place] "Tyna Jaksch,                            Technician, Virgia Land, CRNA Referring MD:             Victorino Sparrow. Kae Heller, MD Medicines:                General Anesthesia Complications:            No immediate complications. Estimated Blood Loss:     Estimated blood loss was minimal. Procedure:                Pre-Anesthesia Assessment:                           - Prior to the procedure, a History and Physical                            was performed, and patient medications and                            allergies were reviewed. The patient's tolerance of                            previous anesthesia was also reviewed. The risks                            and benefits of the procedure and the sedation                            options and risks were discussed with the patient.                            All questions were answered, and informed consent                            was obtained. Prior Anticoagulants: The patient has                            taken no previous anticoagulant or antiplatelet                            agents. ASA Grade Assessment: III - A patient with  severe systemic disease. After reviewing the risks                            and benefits, the patient was deemed in                            satisfactory condition to undergo the procedure.                           After obtaining informed consent, the scope was                            passed under direct vision. Throughout the                             procedure, the patient's blood pressure, pulse, and                            oxygen saturations were monitored continuously. The                            Olympus TJF-[id number]) was introduced through                            the mouth, and used to inject contrast into and                            used to inject contrast into the bile duct. The                            ERCP was accomplished without difficulty. The                            patient tolerated the procedure well. Scope In: Scope Out: Findings:      A scout film of the abdomen was obtained. Surgical clips, consistent       with a previous cholecystectomy, and a biliary stent were seen in the       area of the right upper quadrant of the abdomen. The scope was advanced       to the major papilla in the descending duodenum. A prior sphinterotomy       and a biliary stent were observed. Examination of the pharynx, larynx       and associated structures, and upper GI tract was otherwise normal. One       stent was removed from the biliary tree using a snare and the stent was       totally occluded. Slight oozing from the sphincterotomy after the stent       was removed which stopped on its own. The biliary tree was swept with a       9 mm balloon starting at the bifurcation. Sludge was swept from the       duct. The bile duct was deeply cannulated with the 9 mm balloon.       Contrast was injected. I personally interpreted the bile duct images.  There was appropriate flow of contrast through the ducts. Extravasation       of contrast originating from the cystic duct was observed. Prior       cholecystectomy noted. The biliary tree was otherwise unremarkable. A 10       mm by 6 cm fully covered metal stent was placed 5 cm into the common       bile duct. Bile flowed through the stent. The stent was in good       position. The PD was not cannulated or injected by intention. Impression:               -  Prior sphincterotomy.                           - Prior cholecystectomy.                           - A persistent bile leak was found at the cystic                            duct.                           - One clogged stent was removed from the biliary                            tree.                           - The biliary tree was swept and sludge was found                            and removed.                           - One fully covered metal stent was placed in the                            common bile duct. Moderate Sedation:      Not Applicable - Patient had care per Anesthesia. Recommendation:           - Discharge patient to home (ambulatory).                           - Observe patient's clinical course following                            today's ERCP with therapeutic intervention.                           - Add Miralax once or twice daily for constipation.                           - Return to GI office in 6 weeks.                           - Return to for stent removal  at ERCP in 4-6 months. Procedure Code(s):        --- Professional ---                           [phone], Endoscopic retrograde                            cholangiopancreatography (ERCP); with removal and                            exchange of stent(s), biliary or pancreatic duct,                            including pre- and post-dilation and guide wire                            passage, when performed, including sphincterotomy,                            when performed, each stent exchanged                           [id number], Endoscopic retrograde                            cholangiopancreatography (ERCP); with removal of                            calculi/debris from biliary/pancreatic duct(s) Diagnosis Code(s):        --- Professional ---                           K83.9, Disease of biliary tract, unspecified                           Z46.59, Encounter for fitting and adjustment of                             other gastrointestinal appliance and device                           K83.8, Other specified diseases of biliary tract CPT copyright 2019 American Medical Association. All rights reserved. The codes documented in this report are preliminary and upon coder review may  be revised to meet current compliance requirements. [name], MD 05/13/2020 8:50:40 AM This report has been signed electronically. Number of Addenda: 0

## 2020-05-14 NOTE — Anesthesia Postprocedure Evaluation (Signed)
Anesthesia Post Note  Patient: Kim Hicks  Procedure(s) Performed: ENDOSCOPIC RETROGRADE CHOLANGIOPANCREATOGRAPHY (ERCP) WITH PROPOFOL (N/A ) BILIARY STENT PLACEMENT (N/A ) STENT REMOVAL     Patient location during evaluation: Endoscopy Anesthesia Type: General Level of consciousness: awake and alert Pain management: pain level controlled Vital Signs Assessment: post-procedure vital signs reviewed and stable Respiratory status: spontaneous breathing, nonlabored ventilation, respiratory function stable and patient connected to nasal cannula oxygen Cardiovascular status: blood pressure returned to baseline and stable Postop Assessment: no apparent nausea or vomiting Anesthetic complications: no   No complications documented.  Last Vitals:  Vitals:   05/13/20 0853 05/13/20 0908  BP: (!) 158/78 (!) 161/86  Pulse: 97 97  Resp: (!) 22 (!) 23  Temp:    SpO2: 95% 98%    Last Pain:  Vitals:   05/13/20 0908  TempSrc:   PainSc: 4                  Thecla Forgione L Tramya Schoenfelder

## 2020-05-20 ENCOUNTER — Emergency Department (HOSPITAL_COMMUNITY): Payer: Medicare Other

## 2020-05-20 ENCOUNTER — Encounter: Payer: Self-pay | Admitting: Nurse Practitioner

## 2020-05-20 ENCOUNTER — Encounter (HOSPITAL_COMMUNITY): Payer: Self-pay

## 2020-05-20 ENCOUNTER — Ambulatory Visit (INDEPENDENT_AMBULATORY_CARE_PROVIDER_SITE_OTHER): Payer: Medicare Other | Admitting: Nurse Practitioner

## 2020-05-20 ENCOUNTER — Telehealth: Payer: Self-pay | Admitting: Gastroenterology

## 2020-05-20 ENCOUNTER — Other Ambulatory Visit (INDEPENDENT_AMBULATORY_CARE_PROVIDER_SITE_OTHER): Payer: Medicare Other

## 2020-05-20 ENCOUNTER — Inpatient Hospital Stay (HOSPITAL_COMMUNITY)
Admission: EM | Admit: 2020-05-20 | Discharge: 2020-05-30 | DRG: 920 | Disposition: A | Discharge status: Home / Self Care | Payer: Medicare Other | Attending: Family Medicine | Admitting: Family Medicine

## 2020-05-20 VITALS — BP 112/70 | HR 76 | Ht 64.0 in | Wt 165.2 lb

## 2020-05-20 DIAGNOSIS — K766 Portal hypertension: Secondary | ICD-10-CM | POA: Diagnosis present

## 2020-05-20 DIAGNOSIS — Z79899 Other long term (current) drug therapy: Secondary | ICD-10-CM

## 2020-05-20 DIAGNOSIS — K839 Disease of biliary tract, unspecified: Secondary | ICD-10-CM | POA: Diagnosis present

## 2020-05-20 DIAGNOSIS — M1990 Unspecified osteoarthritis, unspecified site: Secondary | ICD-10-CM | POA: Diagnosis present

## 2020-05-20 DIAGNOSIS — R109 Unspecified abdominal pain: Secondary | ICD-10-CM

## 2020-05-20 DIAGNOSIS — K831 Obstruction of bile duct: Secondary | ICD-10-CM

## 2020-05-20 DIAGNOSIS — R7989 Other specified abnormal findings of blood chemistry: Secondary | ICD-10-CM

## 2020-05-20 DIAGNOSIS — R933 Abnormal findings on diagnostic imaging of other parts of digestive tract: Secondary | ICD-10-CM

## 2020-05-20 DIAGNOSIS — K760 Fatty (change of) liver, not elsewhere classified: Secondary | ICD-10-CM | POA: Diagnosis present

## 2020-05-20 DIAGNOSIS — I10 Essential (primary) hypertension: Secondary | ICD-10-CM | POA: Diagnosis present

## 2020-05-20 DIAGNOSIS — S2239XA Fracture of one rib, unspecified side, initial encounter for closed fracture: Secondary | ICD-10-CM

## 2020-05-20 DIAGNOSIS — T85590A Other mechanical complication of bile duct prosthesis, initial encounter: Principal | ICD-10-CM | POA: Diagnosis present

## 2020-05-20 DIAGNOSIS — E1159 Type 2 diabetes mellitus with other circulatory complications: Secondary | ICD-10-CM | POA: Diagnosis present

## 2020-05-20 DIAGNOSIS — Y732 Prosthetic and other implants, materials and accessory gastroenterology and urology devices associated with adverse incidents: Secondary | ICD-10-CM | POA: Diagnosis present

## 2020-05-20 DIAGNOSIS — I152 Hypertension secondary to endocrine disorders: Secondary | ICD-10-CM | POA: Diagnosis present

## 2020-05-20 DIAGNOSIS — G8929 Other chronic pain: Secondary | ICD-10-CM | POA: Diagnosis present

## 2020-05-20 DIAGNOSIS — Z7984 Long term (current) use of oral hypoglycemic drugs: Secondary | ICD-10-CM

## 2020-05-20 DIAGNOSIS — N390 Urinary tract infection, site not specified: Secondary | ICD-10-CM

## 2020-05-20 DIAGNOSIS — Z818 Family history of other mental and behavioral disorders: Secondary | ICD-10-CM

## 2020-05-20 DIAGNOSIS — E1169 Type 2 diabetes mellitus with other specified complication: Secondary | ICD-10-CM | POA: Diagnosis present

## 2020-05-20 DIAGNOSIS — Z6827 Body mass index (BMI) 27.0-27.9, adult: Secondary | ICD-10-CM

## 2020-05-20 DIAGNOSIS — E440 Moderate protein-calorie malnutrition: Secondary | ICD-10-CM | POA: Diagnosis present

## 2020-05-20 DIAGNOSIS — Z794 Long term (current) use of insulin: Secondary | ICD-10-CM

## 2020-05-20 DIAGNOSIS — R748 Abnormal levels of other serum enzymes: Secondary | ICD-10-CM | POA: Diagnosis not present

## 2020-05-20 DIAGNOSIS — R161 Splenomegaly, not elsewhere classified: Secondary | ICD-10-CM | POA: Diagnosis present

## 2020-05-20 DIAGNOSIS — Z888 Allergy status to other drugs, medicaments and biological substances status: Secondary | ICD-10-CM

## 2020-05-20 DIAGNOSIS — D638 Anemia in other chronic diseases classified elsewhere: Secondary | ICD-10-CM | POA: Diagnosis present

## 2020-05-20 DIAGNOSIS — T85520A Displacement of bile duct prosthesis, initial encounter: Secondary | ICD-10-CM

## 2020-05-20 DIAGNOSIS — E119 Type 2 diabetes mellitus without complications: Secondary | ICD-10-CM

## 2020-05-20 DIAGNOSIS — K668 Other specified disorders of peritoneum: Secondary | ICD-10-CM | POA: Diagnosis present

## 2020-05-20 DIAGNOSIS — Z90710 Acquired absence of both cervix and uterus: Secondary | ICD-10-CM

## 2020-05-20 DIAGNOSIS — R1032 Left lower quadrant pain: Secondary | ICD-10-CM | POA: Diagnosis not present

## 2020-05-20 DIAGNOSIS — Z8249 Family history of ischemic heart disease and other diseases of the circulatory system: Secondary | ICD-10-CM

## 2020-05-20 DIAGNOSIS — K7400 Hepatic fibrosis, unspecified: Secondary | ICD-10-CM | POA: Diagnosis present

## 2020-05-20 DIAGNOSIS — K810 Acute cholecystitis: Secondary | ICD-10-CM

## 2020-05-20 DIAGNOSIS — K7469 Other cirrhosis of liver: Secondary | ICD-10-CM | POA: Diagnosis present

## 2020-05-20 DIAGNOSIS — K5909 Other constipation: Secondary | ICD-10-CM | POA: Diagnosis present

## 2020-05-20 DIAGNOSIS — F419 Anxiety disorder, unspecified: Secondary | ICD-10-CM | POA: Diagnosis present

## 2020-05-20 DIAGNOSIS — E1140 Type 2 diabetes mellitus with diabetic neuropathy, unspecified: Secondary | ICD-10-CM | POA: Diagnosis present

## 2020-05-20 DIAGNOSIS — R61 Generalized hyperhidrosis: Secondary | ICD-10-CM | POA: Diagnosis present

## 2020-05-20 DIAGNOSIS — E785 Hyperlipidemia, unspecified: Secondary | ICD-10-CM | POA: Diagnosis present

## 2020-05-20 DIAGNOSIS — Z9049 Acquired absence of other specified parts of digestive tract: Secondary | ICD-10-CM

## 2020-05-20 DIAGNOSIS — Z20822 Contact with and (suspected) exposure to covid-19: Secondary | ICD-10-CM | POA: Diagnosis present

## 2020-05-20 DIAGNOSIS — Z833 Family history of diabetes mellitus: Secondary | ICD-10-CM

## 2020-05-20 DIAGNOSIS — F323 Major depressive disorder, single episode, severe with psychotic features: Secondary | ICD-10-CM | POA: Diagnosis present

## 2020-05-20 DIAGNOSIS — D509 Iron deficiency anemia, unspecified: Secondary | ICD-10-CM | POA: Diagnosis present

## 2020-05-20 LAB — RESP PANEL BY RT-PCR (FLU A&B, COVID) ARPGX2
Influenza A by PCR: NEGATIVE
Influenza B by PCR: NEGATIVE
SARS Coronavirus 2 by RT PCR: NEGATIVE

## 2020-05-20 LAB — CBC WITH DIFFERENTIAL/PLATELET
Basophils Absolute: 0 10*3/uL (ref 0.0–0.1)
Basophils Relative: 0.4 % (ref 0.0–3.0)
Eosinophils Absolute: 0.3 10*3/uL (ref 0.0–0.7)
Eosinophils Relative: 4.3 % (ref 0.0–5.0)
HCT: 30.2 % — ABNORMAL LOW (ref 36.0–46.0)
Hemoglobin: 9.6 g/dL — ABNORMAL LOW (ref 12.0–15.0)
Lymphocytes Relative: 19.8 % (ref 12.0–46.0)
Lymphs Abs: 1.2 10*3/uL (ref 0.7–4.0)
MCHC: 31.8 g/dL (ref 30.0–36.0)
MCV: 79.7 fl (ref 78.0–100.0)
Monocytes Absolute: 0.4 10*3/uL (ref 0.1–1.0)
Monocytes Relative: 6.4 % (ref 3.0–12.0)
Neutro Abs: 4.3 10*3/uL (ref 1.4–7.7)
Neutrophils Relative %: 69.1 % (ref 43.0–77.0)
Platelets: 222 10*3/uL (ref 150.0–400.0)
RBC: 3.79 Mil/uL — ABNORMAL LOW (ref 3.87–5.11)
RDW: 15.8 % — ABNORMAL HIGH (ref 11.5–15.5)
WBC: 6.2 10*3/uL (ref 4.0–10.5)

## 2020-05-20 LAB — URINALYSIS, ROUTINE W REFLEX MICROSCOPIC
Bilirubin Urine: NEGATIVE
Glucose, UA: >=500 mg/dL — AB
Hgb urine dipstick: NEGATIVE
Ketones, ur: NEGATIVE mg/dL
Nitrite: POSITIVE — AB
Protein, ur: NEGATIVE mg/dL
Specific Gravity, Urine: 1.022 (ref 1.005–1.030)
pH: 5 (ref 5.0–8.0)

## 2020-05-20 LAB — BASIC METABOLIC PANEL
BUN: 25 mg/dL — ABNORMAL HIGH (ref 6–23)
CO2: 22 mEq/L (ref 19–32)
Calcium: 9.2 mg/dL (ref 8.4–10.5)
Chloride: 106 mEq/L (ref 96–112)
Creatinine, Ser: 0.61 mg/dL (ref 0.40–1.20)
GFR: 100.61 mL/min (ref >60.00)
Glucose, Bld: 183 mg/dL — ABNORMAL HIGH (ref 70–99)
Potassium: 4.3 mEq/L (ref 3.5–5.1)
Sodium: 138 mEq/L (ref 135–145)

## 2020-05-20 LAB — HEPATIC FUNCTION PANEL
ALT: 41 U/L — ABNORMAL HIGH (ref 0–35)
AST: 44 U/L — ABNORMAL HIGH (ref 0–37)
Albumin: 3.7 g/dL (ref 3.5–5.2)
Alkaline Phosphatase: 244 U/L — ABNORMAL HIGH (ref 39–117)
Bilirubin, Direct: 0.1 mg/dL (ref 0.0–0.3)
Total Bilirubin: 0.3 mg/dL (ref 0.2–1.2)
Total Protein: 7.6 g/dL (ref 6.0–8.3)

## 2020-05-20 LAB — PROTIME-INR
INR: 1.6 — ABNORMAL HIGH (ref 0.8–1.2)
Prothrombin Time: 18 seconds — ABNORMAL HIGH (ref 11.4–15.2)

## 2020-05-20 LAB — CBG MONITORING, ED: Glucose-Capillary: 85 mg/dL (ref 70–99)

## 2020-05-20 LAB — LIPASE: Lipase: 101 U/L — ABNORMAL HIGH (ref 11.0–59.0)

## 2020-05-20 LAB — APTT: aPTT: 58 seconds — ABNORMAL HIGH (ref 24–36)

## 2020-05-20 MED ORDER — MORPHINE SULFATE (PF) 2 MG/ML IV SOLN
2.0000 mg | INTRAVENOUS | Status: DC | PRN
  Administered 2020-05-21: 2 mg via INTRAVENOUS
  Filled 2020-05-20: qty 1

## 2020-05-20 MED ORDER — ACETAMINOPHEN 325 MG PO TABS: 650.0000 mg | ORAL_TABLET | Freq: Four times a day (QID) | ORAL | Status: DC | PRN

## 2020-05-20 MED ORDER — BREXPIPRAZOLE 2 MG PO TABS
2.0000 mg | ORAL_TABLET | Freq: Every day | ORAL | Status: DC
  Administered 2020-05-21 – 2020-05-29 (×9): 2 mg via ORAL
  Filled 2020-05-20 (×10): qty 1

## 2020-05-20 MED ORDER — IOHEXOL 300 MG/ML  SOLN
100.0000 mL | Freq: Once | INTRAMUSCULAR | Status: AC | PRN
  Administered 2020-05-20: 100 mL via INTRAVENOUS

## 2020-05-20 MED ORDER — ACETAMINOPHEN 650 MG RE SUPP: 650.0000 mg | Freq: Four times a day (QID) | RECTAL | Status: DC | PRN

## 2020-05-20 MED ORDER — ZIPRASIDONE HCL 20 MG PO CAPS
20.0000 mg | ORAL_CAPSULE | Freq: Every day | ORAL | Status: DC
  Administered 2020-05-20 – 2020-05-29 (×10): 20 mg via ORAL
  Filled 2020-05-20 (×10): qty 1

## 2020-05-20 MED ORDER — VORTIOXETINE HBR 5 MG PO TABS
20.0000 mg | ORAL_TABLET | Freq: Every day | ORAL | Status: DC
  Administered 2020-05-20 – 2020-05-21 (×2): 20 mg via ORAL
  Filled 2020-05-20 (×3): qty 4

## 2020-05-20 MED ORDER — SIMVASTATIN 10 MG PO TABS
10.0000 mg | ORAL_TABLET | Freq: Every day | ORAL | Status: DC
Start: 2020-05-21 — End: 2020-05-25
  Administered 2020-05-21 – 2020-05-25 (×5): 10 mg via ORAL
  Filled 2020-05-20 (×5): qty 1

## 2020-05-20 MED ORDER — BREXPIPRAZOLE 2 MG PO TABS
2.0000 mg | ORAL_TABLET | Freq: Every day | ORAL | Status: DC
Start: 2020-05-20 — End: 2020-05-20
  Filled 2020-05-20: qty 1

## 2020-05-20 MED ORDER — MORPHINE SULFATE (PF) 4 MG/ML IV SOLN
4.0000 mg | Freq: Once | INTRAVENOUS | Status: AC
Start: 2020-05-20 — End: 2020-05-20
  Administered 2020-05-20: 4 mg via INTRAVENOUS
  Filled 2020-05-20: qty 1

## 2020-05-20 MED ORDER — ONDANSETRON HCL 4 MG/2ML IJ SOLN
4.0000 mg | Freq: Once | INTRAMUSCULAR | Status: AC
  Administered 2020-05-20: 4 mg via INTRAVENOUS
  Filled 2020-05-20: qty 2

## 2020-05-20 MED ORDER — SODIUM CHLORIDE 0.9 % IV SOLN
1.0000 g | Freq: Once | INTRAVENOUS | Status: DC
  Administered 2020-05-20: 1 g via INTRAVENOUS
  Filled 2020-05-20: qty 10

## 2020-05-20 MED ORDER — PIPERACILLIN-TAZOBACTAM 3.375 G IVPB
3.3750 g | Freq: Three times a day (TID) | INTRAVENOUS | Status: DC
  Administered 2020-05-21 – 2020-05-22 (×4): 3.375 g via INTRAVENOUS
  Filled 2020-05-20 (×5): qty 50

## 2020-05-20 MED ORDER — TOPIRAMATE 100 MG PO TABS
200.0000 mg | ORAL_TABLET | Freq: Two times a day (BID) | ORAL | Status: DC
  Administered 2020-05-20 – 2020-05-30 (×20): 200 mg via ORAL
  Filled 2020-05-20 (×20): qty 2

## 2020-05-20 MED ORDER — INSULIN GLARGINE 100 UNIT/ML ~~LOC~~ SOLN
15.0000 [IU] | Freq: Every day | SUBCUTANEOUS | Status: DC
  Administered 2020-05-20: 15 [IU] via SUBCUTANEOUS
  Filled 2020-05-20: qty 0.15

## 2020-05-20 MED ORDER — MORPHINE SULFATE (PF) 4 MG/ML IV SOLN
4.0000 mg | Freq: Once | INTRAVENOUS | Status: AC
  Administered 2020-05-20: 4 mg via INTRAVENOUS
  Filled 2020-05-20: qty 1

## 2020-05-20 MED ORDER — INSULIN ASPART 100 UNIT/ML ~~LOC~~ SOLN
0.0000 [IU] | Freq: Three times a day (TID) | SUBCUTANEOUS | Status: DC
  Filled 2020-05-20: qty 0.09

## 2020-05-20 MED ORDER — ONDANSETRON HCL 4 MG PO TABS: 4.0000 mg | ORAL_TABLET | Freq: Four times a day (QID) | ORAL | Status: DC | PRN

## 2020-05-20 MED ORDER — PIPERACILLIN-TAZOBACTAM 3.375 G IVPB 30 MIN
3.3750 g | Freq: Once | INTRAVENOUS | Status: AC
  Administered 2020-05-20: 3.375 g via INTRAVENOUS
  Filled 2020-05-20: qty 50

## 2020-05-20 MED ORDER — QUETIAPINE FUMARATE 200 MG PO TABS
800.0000 mg | ORAL_TABLET | Freq: Every day | ORAL | Status: DC
  Administered 2020-05-20 – 2020-05-29 (×10): 800 mg via ORAL
  Filled 2020-05-20: qty 2
  Filled 2020-05-20 (×9): qty 4

## 2020-05-20 MED ORDER — ALBUTEROL SULFATE HFA 108 (90 BASE) MCG/ACT IN AERS
2.0000 | INHALATION_SPRAY | Freq: Four times a day (QID) | RESPIRATORY_TRACT | Status: DC | PRN
  Filled 2020-05-20: qty 6.7

## 2020-05-20 MED ORDER — EZETIMIBE 10 MG PO TABS
10.0000 mg | ORAL_TABLET | Freq: Every day | ORAL | Status: DC
Start: 2020-05-21 — End: 2020-05-25
  Administered 2020-05-21 – 2020-05-25 (×5): 10 mg via ORAL
  Filled 2020-05-20 (×6): qty 1

## 2020-05-20 MED ORDER — PRAZOSIN HCL 1 MG PO CAPS
4.0000 mg | ORAL_CAPSULE | Freq: Every day | ORAL | Status: DC
  Administered 2020-05-20: 4 mg via ORAL
  Filled 2020-05-20: qty 4

## 2020-05-20 MED ORDER — ONDANSETRON HCL 4 MG/2ML IJ SOLN
4.0000 mg | Freq: Four times a day (QID) | INTRAMUSCULAR | Status: DC | PRN
  Administered 2020-05-21 – 2020-05-27 (×6): 4 mg via INTRAVENOUS
  Filled 2020-05-20 (×7): qty 2

## 2020-05-20 MED ORDER — LISINOPRIL 20 MG PO TABS
20.0000 mg | ORAL_TABLET | Freq: Every day | ORAL | Status: DC
  Administered 2020-05-21: 20 mg via ORAL
  Filled 2020-05-20: qty 1

## 2020-05-20 NOTE — ED Triage Notes (Signed)
Pt presents with c/o right side flank pain that started Monday after she had a procedure done ([facility] procedure per pt). Pt reports she was also told by her MD that several of her lab levels were elevated, unable to say exactly which lab levels these were.

## 2020-05-20 NOTE — Patient Instructions (Signed)
If you are age 55 or older, your body mass index should be between 23-30. Your Body mass index is 28.36 kg/m. If this is out of the aforementioned range listed, please consider follow up with your Primary Care Provider.  If you are age 68 or younger, your body mass index should be between 19-25. Your Body mass index is 28.36 kg/m. If this is out of the aformentioned range listed, please consider follow up with your Primary Care Provider.    Please proceed directly to the [facility] ED for STAT CT scan with contrast to evaluate abdominal pain, elevated liver function and lipase post ERCP 05/13/20

## 2020-05-20 NOTE — Telephone Encounter (Signed)
Pt is requesting a call back from a nurse to discuss the severe pain she has been experiencing (left side) where her stent was placed, pt had a stent placed in her bowel by Dr Fuller Plan. Pt would like a call back to advise what she can do.

## 2020-05-20 NOTE — Telephone Encounter (Signed)
Patient is s/p ERCP with stent exchange on 12/20 for bile leak . Patient reports that she developed severe LLQ pain that radiates to her side and to her back on 12/24.  She denies fever.  She is tolerating fluids without nausea and vomiting.  Pain is constant.  She will come for stat labs today at 12:00 and appt with Carl Best, RNP at 1:30.  Patient advised to remain on a clear liquid diet.

## 2020-05-20 NOTE — Progress Notes (Addendum)
05/20/2020 Kim Hicks [id number] 17-Dec-1965   Chief Complaint: Right lower abdominal pain   History of Present Illness: Kim Hicks. Kim Hicks is a 54 year old female with a past medical history of anxiety, depression, hypertension, diabetes mellitus type 2, neuropathy, constipation and cirrhosis. S/P laparoscopic cholecystectomy in March 2021 with a post op bile leak. ERCP performed with a cystic duct leak noted and an 8.5 fr biliary sent was placed a few days post op. ERCP in June 2021 demonstrated a persistent leak from the cystic duct and the 8.5 fr stent was exchanged for a 10 fr biliary stent.  She underwent a repeat ERCP by Dr. Fuller Plan on 05/13/2020 and a clogged biliary stent was removed with evidence of a persistent bile leak identified at the cystic duct.  A fully covered metal stent was placed in the common bile duct.  A repeat ERCP to remove the biliary stent was recommended in 4 to 6 months.  She presents today for further evaluation regarding right lower quadrant abdominal pain which started post ERCP. She awakened from the ERCP with mild RLQ pain. Tuesday 12/21 her RLQ and right flank pain progressively worsened. She described her pain as a throbbing knife twisting pain which was constant. She took her usual Tramadol 67m bid and Meloxicam QD which she takes for arthritis which did not relieve her right sided abdominal pain. Her RLQ/right flank pain worsened and she reported calling our office on 12/23 and 12/24 without a response, however, no phone calls were documented in [place] She called our office today with complaints of severe abdominal pain. She rates her RLQ/flank pain a 10 out of 10 pain. No fever, sweats or chills. No N/V. No jaundice. She last passed a normal brown formed BM on Wed 05/15/2020.  She is passing gas per the rectum.  She is drinking 6 to 8 twelve ounce bottles of water daily.  Her urine is light yellow and clear.  She ate fried chicken salad yesterday and a hot dog. No  solid food today. No jaundice.  She is unable to sleep at night due to the pain.  She tried sleeping in a recliner without much improvement.  She is using a heating pad.  She went to our laboratory today prior to her appointment.  WBC 6.2.  Hemoglobin down to 9.6 (basline Hg 10.8).  Alk phos 244.  AST 44.  ALT 41.  Total bili 0.3.  Lipase 101.   CMP Latest Ref Rng & Units 05/20/2020 02/16/2020 11/07/2019  Glucose 70 - 99 mg/dL 183(H) 339(H) 321(H)  BUN 6 - 23 mg/dL 25(H) 39(H) 31(H)  Creatinine 0.40 - 1.20 mg/dL 0.61 1.08(H) 0.57  Sodium 135 - 145 mEq/L 138 138 140  Potassium 3.5 - 5.1 mEq/L 4.3 3.9 4.9  Chloride 96 - 112 mEq/L 106 107 108  CO2 19 - 32 mEq/L 22 17(L) 20(L)  Calcium 8.4 - 10.5 mg/dL 9.2 8.1(L) 8.4(L)  Total Protein 6.0 - 8.3 g/dL 7.6 6.8 6.7  Total Bilirubin 0.2 - 1.2 mg/dL 0.3 1.1 0.8  Alkaline Phos 39 - 117 U/L 244(H) 181(H) 203(H)  AST 0 - 37 U/L 44(H) 95(H) 54(H)  ALT 0 - 35 U/L 41(H) 77(H) 48(H)   Lipase 101  CBC Latest Ref Rng & Units 05/20/2020 02/16/2020 11/07/2019  WBC 4.0 - 10.5 K/uL 6.2 9.5 6.8  Hemoglobin 12.0 - 15.0 g/dL 9.6(L) 10.8(L) 10.1(L)  Hematocrit 36.0 - 46.0 % 30.2(L) 33.1(L) 32.8(L)  Platelets 150.0 - 400.0  K/uL 222.0 139(L) 98(L)    ERCP by Dr. Fuller Plan 05/13/2020: - Prior sphincterotomy. - Prior cholecystectomy. - A persistent bile leak was found at the cystic duct. - One clogged stent was removed from the biliary tree. - The biliary tree was swept and sludge was found and removed. - One fully covered metal stent was placed in the common bile duct - Discharge patient to home (ambulatory). - Observe patient's clinical course following today's ERCP with therapeutic intervention. - Add Miralax once or twice daily for constipation. - Return to GI office in 6 weeks. - Return to for stent removal at ERCP in 4-6 months.  ERCP 11/07/2019: - A bile leak was found at the cystic duct. - Prior cholecystectomy. - Prior sphincterotomy - One stent was  exchanged in the common bile duct. New stent was upsized to 10 fr.  ERCP 08/07/2019: Bubbles were found in the biliary tract. - A bile leak was found in the area of the - A bile leak was found in the area of the cystic duct, gallbladder bed. - Prior cholecystectomy. - A biliary sphincterotomy was performed. - The biliary tree was swept and nothing was found. - One plastic stent was placed into the common bile duct.  Colonoscopy 06/21/2019: - Preparation of the colon was inadequate. - Stool in the entire examined colon. - Internal hemorrhoids. - The examination was otherwise normal on direct and retroflexion views. - No specimens collected. Impression: - Repeat colonoscopy in 2 months with an extended bowel preparation because the bowel preparation was inadequate.   Past Medical History:  Diagnosis Date  . Anxiety   . Cataract   . Cirrhosis of liver ([place])   . Depression   . Diabetes mellitus without complication ([place])   . Dysrhythmia   . Edema   . Family history of adverse reaction to anesthesia    mother had trouble waking up after surgery  . Foot fracture, left   . Headache    MIGRAINES  . Hyperlipidemia   . Hypertension   . Neuromuscular disorder ([place])    NEUROPATHY-both hands and feet  . Palpitations   . Wheezing    Current Outpatient Medications on File Prior to Visit  Medication Sig Dispense Refill  . albuterol (VENTOLIN HFA) 108 (90 Base) MCG/ACT inhaler Inhale 2 puffs into the lungs every 6 (six) hours as needed.    . brexpiprazole (REXULTI) 2 MG TABS tablet Take 2 mg by mouth at bedtime.    . calcium carbonate (OSCAL) 1500 (600 Ca) MG TABS tablet Take 600 mg of elemental calcium by mouth daily with breakfast.    . Dulaglutide 4.5 MG/0.5ML SOPN Inject 4 mg into the skin once a week. Monday    . Empagliflozin-metFORMIN HCl ER (SYNJARDY XR) 25-1000 MG TB24 Take 1 tablet by mouth daily before breakfast.     . ezetimibe (ZETIA) 10 MG tablet Take 10 mg by mouth  daily.    . ferrous sulfate 325 (65 FE) MG tablet Take 325 mg by mouth daily with breakfast.    . Insulin Glargine (BASAGLAR KWIKPEN) 100 UNIT/ML Inject 28 Units into the skin at bedtime.    [name] LINZESS 290 MCG CAPS capsule Take 290 mcg by mouth every morning.    [name] lisinopril (ZESTRIL) 20 MG tablet TAKE 1 TABLET BY MOUTH EVERY DAY (Patient taking differently: Take 20 mg by mouth daily.) 90 tablet 1  . metFORMIN (GLUCOPHAGE) 1000 MG tablet Take 1,000 mg by mouth at bedtime.    [name]  Omega-3 Fatty Acids (OMEGA 3 PO) Take 500 mg by mouth daily.     . pantoprazole (PROTONIX) 20 MG tablet Take 20 mg by mouth daily.    . prazosin (MINIPRESS) 2 MG capsule Take 4 mg by mouth at bedtime.    . pregabalin (LYRICA) 100 MG capsule TAKE 1 CAPSULE BY MOUTH THREE TIMES A DAY (Patient taking differently: Take 100 mg by mouth 3 (three) times daily.) 90 capsule 3  . QUEtiapine (SEROQUEL) 400 MG tablet Take 800 mg by mouth at bedtime.    . simvastatin (ZOCOR) 10 MG tablet Take 10 mg by mouth daily before breakfast.   6  . topiramate (TOPAMAX) 200 MG tablet Take 200 mg by mouth 2 (two) times daily.    . traMADol ([name]) 50 MG tablet Take 50 mg by mouth daily as needed for moderate pain.     [name] vortioxetine HBr ([name]) 20 MG TABS tablet Take 20 mg by mouth at bedtime.    . ziprasidone ([name]) 20 MG capsule Take 20 mg by mouth at bedtime.     No current facility-administered medications on file prior to visit.   Allergies  Allergen Reactions  . Talwin [Pentazocine]     Turned blue around lips and face, rash all over body     Current Medications, Allergies, Past Medical History, Past Surgical History, Family History and Social History were reviewed in [employer] record.   Review of Systems:   Constitutional: Negative for fever, sweats, chills or weight loss.  Respiratory: Negative for shortness of breath.   Cardiovascular: Negative for chest pain, palpitations and leg swelling.   Gastrointestinal: See HPI.  Musculoskeletal: Negative for back pain or muscle aches.  Neurological: Negative for dizziness, headaches or paresthesias.    Physical Exam: BP 112/70   Pulse 76   Ht 5' 4"  (1.626 m)   Wt 165 lb 3.2 oz (74.9 kg)   BMI 28.36 kg/m  General: 54 year old female in no acute distress. Head: Normocephalic and atraumatic. Eyes: No scleral icterus. Conjunctiva pink . Ears: Normal auditory acuity. Mouth: Few missing teeth. No ulcers or lesions.  Lungs: Clear throughout to auscultation. Heart: Regular rate and rhythm, no murmur. Abdomen: Soft, nondistended.  Moderate epigastric tenderness.  Moderate right flank and right lower quadrant tenderness without rebound or guarding.  Significant right CVA tenderness.  No masses or hepatomegaly. Normal bowel sounds x 4 quadrants.  Rectal: Deferred.  Musculoskeletal: Symmetrical with no gross deformities. Extremities: No edema. Neurological: Alert oriented x 4. No focal deficits.  Psychological: Alert and cooperative. Normal mood and affect  Assessment and Recommendations:  60.  54 year old female  S/P laparoscopic cholecystectomy in March 2021 with a post op bile leak. ERCP performed with a cystic duct leak noted and an 8.5 fr biliary sent was placed a few days post op. ERCP in June 2021 demonstrated a persistent leak from the cystic duct and the 8.5 fr stent was exchanged for a 10 fr biliary stent.  She underwent a repeat ERCP by Dr. Fuller Plan on 05/13/2020 and a clogged biliary stent was removed with evidence of a persistent bile leak identified at the cystic duct.  A fully covered metal stent was placed in the common bile duct.  She developed right flank and right lower quadrant abdominal pain which has progressively worsened since her ERCP 12/20.  She now rates her abdominal pain a 10.  Alk phos and LFTs elevated. Lipase 101 concerning for post ERCP pancreatitis. -Patient was sent  directly to [facility] ED for stat  abdominal/pelvic CT with oral and IV contrast to rule out biliary stent migration, bile peritonitis, pancreatitis, retroperitioneal bleed and kidney stones.  She may require hospital admission for further treatment, IV hydration and pain management. -Further recommendations to be determined after ED evaluation completed  2.  Chronic normocytic anemia, unclear etiology. Hg 10.8 -> 9.6. MCV 79.7.  3.  Questionable cirrhosis, unlikely with normal platelet count 222  4.  Diabetes mellitus type 2  5. Chronic constipation on Linzess 290 mcg QD  6. Colon cancer screening. Last colonoscopy 05/2019 poor prep, repeat colonoscopy 2 months recommended -Eventual repeat colonoscopy with 2 day bowel prep

## 2020-05-20 NOTE — Progress Notes (Signed)
Pharmacy Antibiotic Note  Kim Hicks is a 54 y.o. female admitted on 05/20/2020 with medical history significant for liver cirrhosis, insulin-dependent type 2 diabetes, hypertension, hyperlipidemia, anemia of chronic disease, depression/anxiety who presents to the ED for evaluation of right flank and right sided abdominal pain.  Pharmacy has been consulted for zosyn dosing.  Plan: Zosyn 3.375g IV Q8H infused over 4hrs. Pharmacy will sign off as renal adjustment unlikely, will follow peripherally     Temp (24hrs), Avg:98.5 F (36.9 C), Min:98 F (36.7 C), Max:99 F (37.2 C)  Recent Labs  Lab 05/20/20 1152  WBC 6.2  CREATININE 0.61    Estimated Creatinine Clearance: 78.8 mL/min (by C-G formula based on SCr of 0.61 mg/dL).    Allergies  Allergen Reactions  . Talwin [Pentazocine]     Turned blue around lips and face, rash all over body    Dolly Rias RPh 05/20/2020, 10:56 PM

## 2020-05-20 NOTE — ED Provider Notes (Signed)
[facility] DEPT Provider Note   CSN: [id number] Arrival date & time: 05/20/20  1423     History Chief Complaint  Patient presents with  . Flank Pain  . Abnormal Lab    Kim Hicks is a 54 y.o. female.  Kim Hicks is a 54 y.o. female with a history of hypertension, hyperlipidemia, diabetes, cirrhosis, s/p cholecystectomy with bile leak and biliary stent placement.  Patient had ERCP with Dr. Fuller Plan on 12/20 for persistent bile leak and had clogged stent removed and a new stent replaced initially was doing well but on 12/24 developed right-sided abdominal pain.  She reports pain starts in the right upper quadrant and radiates down into her lower quadrant and around to her flank and back.  Pain has been constant and worsening.  She has had some occasional nausea but no vomiting.  Reports she has chronic constipation which is unchanged but she has been having a lot of foul-smelling gas which is atypical for her.  No fevers or chills but has been feeling poorly.  No cough, chest pain or shortness of breath.  Denies burning or pain with urination and no hematuria but has had right flank pain associated with this pain.  Called her GI office and was seen today by NP Carl Best, had stat lab work done and was sent to the ED for CT scan to assess for potential stent migration, bile peritonitis or other intra-abdominal infection as source for patient's significant pain.        Past Medical History:  Diagnosis Date  . Anxiety   . Cataract   . Cirrhosis of liver ([place])   . Depression   . Diabetes mellitus without complication ([place])   . Dysrhythmia   . Edema   . Family history of adverse reaction to anesthesia    mother had trouble waking up after surgery  . Foot fracture, left   . Headache    MIGRAINES  . Hyperlipidemia   . Hypertension   . Neuromuscular disorder ([place])    NEUROPATHY-both hands and feet  . Palpitations   . Wheezing      Patient Active Problem List   Diagnosis Date Noted  . Biliary sludge   . Encounter for removal of biliary stent   . Diabetic peripheral neuropathy ([place]) 09/14/2019  . Gait disturbance 09/14/2019  . NASH (nonalcoholic steatohepatitis) 08/21/2019  . Bile leak   . Other cirrhosis of liver ([place]) 08/04/2019  . DYSPHAGIA UNSPECIFIED 03/04/2009  . GERD 02/18/2009  . PERSISTENT VOMITING 02/18/2009  . CHEST PAIN 02/18/2009  . NAUSEA AND VOMITING 02/18/2009  . DEHYDRATION 07/19/2007  . GASTROENTERITIS 07/19/2007  . FIBROIDS, UTERUS 05/02/2007  . DEPRESSION 05/02/2007  . MIGRAINE HEADACHE 05/02/2007  . Essential hypertension 05/02/2007  . HEMORRHOIDS 05/02/2007  . Chronic constipation 05/02/2007  . IBS 05/02/2007  . PSORIASIS 05/02/2007  . PITUITARY NEOPLASM, HX OF 05/02/2007  . LIVER FUNCTION TESTS, ABNORMAL, HX OF 05/02/2007  . DIABETES MELLITUS, TYPE II 02/04/2007  . HYPERLIPIDEMIA 02/04/2007  . SINUSITIS, ACUTE 02/04/2007  . ABDOMINAL PAIN, RIGHT UPPER QUADRANT 02/04/2007    Past Surgical History:  Procedure Laterality Date  . ABDOMINAL HYSTERECTOMY     one ovary left  . BILIARY STENT PLACEMENT N/A 08/07/2019   Procedure: BILIARY STENT PLACEMENT;  Surgeon: [name], MD;  Location: [facility];  Service: Endoscopy;  Laterality: N/A;  . BILIARY STENT PLACEMENT N/A 11/07/2019   Procedure: BILIARY STENT PLACEMENT;  Surgeon: Lucio Edward  T, MD;  Location: [facility];  Service: Endoscopy;  Laterality: N/A;  . BILIARY STENT PLACEMENT N/A 05/13/2020   Procedure: BILIARY STENT PLACEMENT;  Surgeon: [name], MD;  Location: [facility];  Service: Endoscopy;  Laterality: N/A;  . CATARACT EXTRACTION W/PHACO Right 01/11/2018   Procedure: CATARACT EXTRACTION PHACO AND INTRAOCULAR LENS PLACEMENT (IOC);  Surgeon: [name], MD;  Location: [facility];  Service: Ophthalmology;  Laterality: Right;  [place] 00:57.5 AP% 15.0 CDE 8.61 Fluid Pack Lot # [id number]  . CATARACT  EXTRACTION W/PHACO Left 02/15/2018   Procedure: CATARACT EXTRACTION PHACO AND INTRAOCULAR LENS PLACEMENT (IOC);  Surgeon: [name], MD;  Location: [facility];  Service: Ophthalmology;  Laterality: Left;  [place] 00:51 AP% 13.2 CDE 6.82 Fluid p ack lot # [id number]  . CHOLECYSTECTOMY N/A 08/04/2019   Procedure: LAPAROSCOPIC CHOLECYSTECTOMY;  Surgeon: [name], MD;  Location: [facility];  Service: General;  Laterality: N/A;  . COLONOSCOPY    . ENDOSCOPIC RETROGRADE CHOLANGIOPANCREATOGRAPHY (ERCP) WITH PROPOFOL N/A 11/07/2019   Procedure: ENDOSCOPIC RETROGRADE CHOLANGIOPANCREATOGRAPHY (ERCP) WITH PROPOFOL;  Surgeon: [name], MD;  Location: [facility];  Service: Endoscopy;  Laterality: N/A;  . ENDOSCOPIC RETROGRADE CHOLANGIOPANCREATOGRAPHY (ERCP) WITH PROPOFOL N/A 05/13/2020   Procedure: ENDOSCOPIC RETROGRADE CHOLANGIOPANCREATOGRAPHY (ERCP) WITH PROPOFOL;  Surgeon: [name], MD;  Location: [facility];  Service: Endoscopy;  Laterality: N/A;  . ERCP N/A 08/07/2019   Procedure: ENDOSCOPIC RETROGRADE CHOLANGIOPANCREATOGRAPHY (ERCP);  Surgeon: [name], MD;  Location: [facility];  Service: Endoscopy;  Laterality: N/A;  . RCR Bilateral   . REMOVAL OF STONES  08/07/2019   Procedure: REMOVAL OF STONES;  Surgeon: [name], MD;  Location: [facility];  Service: Endoscopy;;  balloon sweep, no stones  . SPHINCTEROTOMY  08/07/2019   Procedure: SPHINCTEROTOMY;  Surgeon: [name], MD;  Location: [facility];  Service: Endoscopy;;  . STENT REMOVAL  11/07/2019   Procedure: STENT REMOVAL;  Surgeon: [name], MD;  Location: [facility];  Service: Endoscopy;;  . Lavell Islam REMOVAL  05/13/2020   Procedure: STENT REMOVAL;  Surgeon: [name], MD;  Location: [facility];  Service: Endoscopy;;     OB History   No obstetric history on file.     Family History  Problem Relation Age of Onset  . Diabetes Mother   . Heart failure Mother   . Depression  Mother   . Colon cancer Neg Hx   . Stomach cancer Neg Hx   . Esophageal cancer Neg Hx   . Pancreatic cancer Neg Hx     Social History   Tobacco Use  . Smoking status: Never Smoker  . Smokeless tobacco: Never Used  Vaping Use  . Vaping Use: Never used  Substance Use Topics  . Alcohol use: Never  . Drug use: Never    Home Medications Prior to Admission medications   Medication Sig Start Date End Date Taking? Authorizing Provider  albuterol (VENTOLIN HFA) 108 (90 Base) MCG/ACT inhaler Inhale 2 puffs into the lungs every 6 (six) hours as needed. 02/27/20   [provider]  brexpiprazole (REXULTI) 2 MG TABS tablet Take 2 mg by mouth at bedtime.    [provider]  calcium carbonate (OSCAL) 1500 (600 Ca) MG TABS tablet Take 600 mg of elemental calcium by mouth daily with breakfast.    [provider]  Dulaglutide 4.5 MG/0.5ML SOPN Inject 4 mg into the skin once a week. Monday 12/20/18   [provider]  Empagliflozin-metFORMIN HCl  ER (SYNJARDY XR) 25-1000 MG TB24 Take 1 tablet by mouth daily before breakfast.  11/15/18   [provider]  ezetimibe (ZETIA) 10 MG tablet Take 10 mg by mouth daily. 04/25/20   [provider]  ferrous sulfate 325 (65 FE) MG tablet Take 325 mg by mouth daily with breakfast.    [provider]  Insulin Glargine (BASAGLAR KWIKPEN) 100 UNIT/ML Inject 28 Units into the skin at bedtime. 03/20/20   [provider]  LINZESS 290 MCG CAPS capsule Take 290 mcg by mouth every morning. 08/16/19   [provider]  lisinopril (ZESTRIL) 20 MG tablet TAKE 1 TABLET BY MOUTH EVERY DAY Patient taking differently: Take 20 mg by mouth daily. 02/08/20   [name], MD  metFORMIN (GLUCOPHAGE) 1000 MG tablet Take 1,000 mg by mouth at bedtime. 02/27/20   [provider]  Omega-3 Fatty Acids (OMEGA 3 PO) Take 500 mg by mouth daily.     [provider]  pantoprazole (PROTONIX) 20 MG tablet  Take 20 mg by mouth daily. 02/27/20   [provider]  prazosin (MINIPRESS) 2 MG capsule Take 4 mg by mouth at bedtime.    [provider]  pregabalin (LYRICA) 100 MG capsule TAKE 1 CAPSULE BY MOUTH THREE TIMES A DAY Patient taking differently: Take 100 mg by mouth 3 (three) times daily. 02/12/20   [name], NP  QUEtiapine (SEROQUEL) 400 MG tablet Take 800 mg by mouth at bedtime.    [provider]  simvastatin (ZOCOR) 10 MG tablet Take 10 mg by mouth daily before breakfast.  12/13/17   [provider]  topiramate (TOPAMAX) 200 MG tablet Take 200 mg by mouth 2 (two) times daily. 04/12/20   [provider]  traMADol ([name]) 50 MG tablet Take 50 mg by mouth daily as needed for moderate pain.  01/13/19   [provider]  vortioxetine HBr ([name]) 20 MG TABS tablet Take 20 mg by mouth at bedtime.    [provider]  ziprasidone ([name]) 20 MG capsule Take 20 mg by mouth at bedtime.    [provider]    Allergies    Talwin [pentazocine]  Review of Systems   Review of Systems  Constitutional: Positive for chills. Negative for fever.  HENT: Negative.   Respiratory: Negative for cough and shortness of breath.   Cardiovascular: Negative for chest pain.  Gastrointestinal: Positive for abdominal pain and nausea. Negative for blood in stool, constipation, diarrhea and vomiting.  Genitourinary: Negative for dysuria and frequency.  Musculoskeletal: Negative for arthralgias and myalgias.  Skin: Negative for color change and rash.  Neurological: Negative for dizziness, syncope and light-headedness.  All other systems reviewed and are negative.   Physical Exam Updated Vital Signs BP 129/68 (BP Location: Left Arm)   Pulse 94   Temp 98 F (36.7 C) (Oral)   Resp 18   SpO2 97%   Physical Exam Vitals and nursing note reviewed.  Constitutional:      General: She is not in acute distress.    Appearance: Normal  appearance. She is well-developed, normal weight and well-nourished. She is not ill-appearing or diaphoretic.     Comments: Patient appears uncomfortable, but in no acute distress  HENT:     Head: Normocephalic and atraumatic.     Mouth/Throat:     Mouth: Oropharynx is clear and moist. Mucous membranes are moist.     Pharynx: Oropharynx is clear.  Eyes:     General:  Right eye: No discharge.        Left eye: No discharge.     Extraocular Movements: EOM normal.  Cardiovascular:     Rate and Rhythm: Normal rate and regular rhythm.     Pulses: Intact distal pulses.     Heart sounds: Normal heart sounds. No murmur heard. No friction rub. No gallop.   Pulmonary:     Effort: Pulmonary effort is normal. No respiratory distress.     Breath sounds: Normal breath sounds. No wheezing or rales.     Comments: Respirations equal and unlabored, patient able to speak in full sentences, lungs clear to auscultation bilaterally  Abdominal:     General: Bowel sounds are normal. There is no distension.     Palpations: Abdomen is soft. There is no mass.     Tenderness: There is abdominal tenderness. There is right CVA tenderness. There is no guarding.     Comments: Abdomen is soft and nondistended, bowel sounds present throughout, there is focal right upper quadrant tenderness that extends around to the right flank and into the right lower quadrant with no guarding, no peritoneal signs.  Musculoskeletal:        General: No deformity or edema.     Cervical back: Neck supple.  Skin:    General: Skin is warm and dry.     Capillary Refill: Capillary refill takes less than 2 seconds.  Neurological:     Mental Status: She is alert.     Coordination: Coordination normal.     Comments: Speech is clear, able to follow commands Moves extremities without ataxia, coordination intact  Psychiatric:        Mood and Affect: Mood normal.        Behavior: Behavior normal.     ED Results / Procedures /  Treatments   Labs (all labs ordered are listed, but only abnormal results are displayed) Labs Reviewed  URINALYSIS, ROUTINE W REFLEX MICROSCOPIC - Abnormal; Notable for the following components:      Result Value   APPearance HAZY (*)    Glucose, UA >=500 (*)    Nitrite POSITIVE (*)    Leukocytes,Ua TRACE (*)    Bacteria, UA MANY (*)    All other components within normal limits  PROTIME-INR - Abnormal; Notable for the following components:   Prothrombin Time 18.0 (*)    INR 1.6 (*)    All other components within normal limits  APTT - Abnormal; Notable for the following components:   aPTT 58 (*)    All other components within normal limits  RESP PANEL BY RT-PCR (FLU A&B, COVID) ARPGX2  URINE CULTURE  HIV ANTIBODY (ROUTINE TESTING W REFLEX)  COMPREHENSIVE METABOLIC PANEL  CBC  HEMOGLOBIN A1C  CBG MONITORING, ED    EKG None  Radiology CT ABDOMEN PELVIS W CONTRAST  Result Date: 05/20/2020 CLINICAL DATA:  Recent ERCP with stent placement, prior cholecystectomy EXAM: CT ABDOMEN AND PELVIS WITH CONTRAST TECHNIQUE: Multidetector CT imaging of the abdomen and pelvis was performed using the standard protocol following bolus administration of intravenous contrast. CONTRAST:  146m OMNIPAQUE IOHEXOL 300 MG/ML  SOLN COMPARISON:  March 12, 2020 FINDINGS: Lower chest: The visualized heart size within normal limits. No pericardial fluid/thickening. No hiatal hernia. The visualized portions of the lungs are clear. Hepatobiliary: There is a slightly nodular liver contour which is diffusely low signal throughout. The portal vein appears to be patent. The patient is status post common bile duct stent placement. Within the area  of the gallbladder bed there is a new multilocular fluid collection measuring approximately 4.6 x 2.6 cm. The adjacent cholecystectomy surgical clips are noted. A small amount of pneumobilia is noted. There appears to be mild fat stranding changes seen adjacent to the  gallbladder bed at the level of the proximal bile duct stent. No free air is noted. Pancreas: Unremarkable. No pancreatic ductal dilatation or surrounding inflammatory changes. Spleen: The spleen is mildly enlarged measuring 15 cm in craniocaudad dimension. Adrenals/Urinary Tract: Both adrenal glands appear normal. The kidneys and collecting system appear normal without evidence of urinary tract calculus or hydronephrosis. Bladder is partially decompressed. Stomach/Bowel: The stomach, small bowel, and colon are normal in appearance. No inflammatory changes, wall thickening, or obstructive findings.A moderate to large amount of colonic stool is present throughout. The appendix is unremarkable. Vascular/Lymphatic: There are no enlarged mesenteric, retroperitoneal, or pelvic lymph nodes. No significant vascular findings are present. Reproductive: The patient is status post hysterectomy. No adnexal masses or collections seen. Other: No evidence of abdominal wall mass or hernia. Musculoskeletal: No acute or significant osseous findings. IMPRESSION: Multilocular fluid collection within the gallbladder bed measuring 4.6 x 2.6 cm which could be due to biloma, or postprocedural seroma. Mild inflammatory changes seen within the porta hepatis No definite evidence of pancreatitis Findings suggestive of cirrhosis and portal hypertension Electronically Signed   By: Prudencio Pair M.D.   On: 05/20/2020 19:13    Procedures Procedures (including critical care time)  Medications Ordered in ED Medications  piperacillin-tazobactam (ZOSYN) IVPB 3.375 g (3.375 g Intravenous Not Given 05/20/20 2316)  morphine 4 MG/ML injection 4 mg (4 mg Intravenous Given 05/20/20 1541)  ondansetron (ZOFRAN) injection 4 mg (4 mg Intravenous Given 05/20/20 1542)  iohexol (OMNIPAQUE) 300 MG/ML solution 100 mL (100 mLs Intravenous Contrast Given 05/20/20 1853)  piperacillin-tazobactam (ZOSYN) IVPB 3.375 g (0 g Intravenous Stopped 05/20/20 2124)   morphine 4 MG/ML injection 4 mg (4 mg Intravenous Given 05/20/20 2120)    ED Course  I have reviewed the triage vital signs and the nursing notes.  Pertinent labs & imaging results that were available during my care of the patient were reviewed by me and considered in my medical decision making (see chart for details).    MDM Rules/Calculators/A&P                         54 year old female sent from GI office due to worsening right-sided abdominal pain after recent ERCP and biliary stent replacement.  Patient had lab work done at GI office that is overall unremarkable.  They recommended CT abdomen pelvis with IV and oral contrast to assess for possible stent migration, bile peritonitis or other intra-abdominal source for pain.  Patient is also having associated right flank pain, could also be renal stone or UTI, will get urinalysis.  IV pain and nausea medication given.  Patient is currently afebrile with stable vitals.  Urinalysis concerning for infection with positive nitrites, trace leukocytes, 21-50 WBCs and many bacteria, given patient's right flank pain concerning that UTI could be contributing to patient's symptoms, will give Rocephin.  CT with multilocular fluid collection within the gallbladder bed measuring 4.6 x 2.6 cm which could be due to biloma or postprocedural seroma.  There are mild inflammatory changes noted within the porta hepatis and findings suggestive of cirrhosis with portal hypertension.  Will discuss CT findings with GI.  Case discussed with Dr. Ardis Hughs with [facility] who feels that patient will  likely need IR drain placement, suspect this is biloma, but would have expected stent to help with this.  Recommends broadening antibiotic coverage to Zosyn.  Does not feel that IR needs to be emergently consulted tonight, this can be done in the morning and GI team will see the patient for formal consult in the morning as well.  Recommends medicine admission.  Case discussed  with Dr. Posey Pronto with [facility] who will see and admit the patient.  Final Clinical Impression(s) / ED Diagnoses Final diagnoses:  Biloma  Acute UTI    Rx / DC Orders ED Discharge Orders    None       Janet Berlin 05/20/20 [place], DO 05/21/20 1603

## 2020-05-20 NOTE — H&P (Signed)
History and Physical    Kim Hicks ZOX:[id number] DOB: December 06, 1965 DOA: 05/20/2020  PCP: [name], FNP  Patient coming from: GI clinic  I have personally briefly reviewed patient's old medical records in [place]  Chief Complaint: Right-sided abdominal and flank pain  HPI: Kim Hicks is a 54 y.o. female with medical history significant for liver cirrhosis, insulin-dependent type 2 diabetes, hypertension, hyperlipidemia, anemia of chronic disease, depression/anxiety who presents to the ED for evaluation of right flank and right sided abdominal pain.  Patient has known persistent bile leak after undergoing laparoscopic cholecystectomy on 08/04/2019 for chronic cholecystitis.  She had a postop bile leak and underwent ERCP 08/07/2019 by Dr. Fuller Plan at which time a biliary sphincterotomy was performed and stent was placed into the CBD.  Follow-up ERCP was performed 11/07/2019 and persistent bile leak was again found in biliary stent was exchanged.  She underwent repeat ERCP 05/13/2020 when again a persistent bile leak was found at the cystic duct.  A clogged stent was removed from the biliary tree and a metal stent was placed in the CBD.  The following day patient developed progressively worsening RLQ and right flank pain.  Pain has been constant and radiating from her right groin up to her RUQ and right flank.  She has had associated diaphoresis without subjective fevers or chills.  She reports nausea without emesis.  She says she has occasional midsternal sharp nonradiating chest discomfort when she feels anxious.  She reports chronic constipation with last bowel movement 5 days ago which is usual for her.  She reports increased urinary frequency without dysuria.  She has not seen any swelling in her legs or abdomen.  She was seen by GI in clinic earlier today 05/20/2020.  Previsit labs showed alk phos 244, AST 44, ALT 41, total bilirubin 0.3, lipase 101, WBC 6.2, hemoglobin 9.6.    There was concern for developing post ERCP pancreatitis versus biliary stent migration versus bile peritonitis and she was sent to [facility] long ED for stat CT abdomen/pelvis.  ED Course:  Vitals showed BP 129/68, pulse 94, RR 18, temp 98.0 Fahrenheit, SPO2 97% on room air.  Urinalysis shows positive nitrites, trace leukocytes, 0-5 RBC/hpf, 21-50 WBC, many bacteria on microscopy.  Urine culture was obtained and pending.  INR 1.6.  SARS-CoV-2 PCR panel was obtained and pending.  CT abdomen/pelvis with contrast showed a multilocular fluid collection within the gallbladder bed measuring 4.6 x 2.6 cm which could be due to biloma or postprocedural seroma.  No definite evidence of pancreatitis was seen.  Findings suggestive of cirrhosis and portal hypertension also noted.  Patient was given IV morphine 4 mg x 2 for pain.  EDP discussed with on-call GI who recommended starting antibiotics with IV Zosyn.  They also recommended consult to IR for potential percutaneous drain placement and that GI will consult in a.m.  The hospitalist service was consulted to admit for further evaluation and management.  Review of Systems: All systems reviewed and are negative except as documented in history of present illness above.   Past Medical History:  Diagnosis Date  . Anxiety   . Cataract   . Cirrhosis of liver ([place])   . Depression   . Diabetes mellitus without complication ([place])   . Dysrhythmia   . Edema   . Family history of adverse reaction to anesthesia    mother had trouble waking up after surgery  . Foot fracture, left   . Headache  MIGRAINES  . Hyperlipidemia   . Hypertension   . Neuromuscular disorder ([place])    NEUROPATHY-both hands and feet  . Palpitations   . Wheezing     Past Surgical History:  Procedure Laterality Date  . ABDOMINAL HYSTERECTOMY     one ovary left  . BILIARY STENT PLACEMENT N/A 08/07/2019   Procedure: BILIARY STENT PLACEMENT;  Surgeon: [name], MD;   Location: [facility];  Service: Endoscopy;  Laterality: N/A;  . BILIARY STENT PLACEMENT N/A 11/07/2019   Procedure: BILIARY STENT PLACEMENT;  Surgeon: [name], MD;  Location: [facility];  Service: Endoscopy;  Laterality: N/A;  . BILIARY STENT PLACEMENT N/A 05/13/2020   Procedure: BILIARY STENT PLACEMENT;  Surgeon: [name], MD;  Location: [facility];  Service: Endoscopy;  Laterality: N/A;  . CATARACT EXTRACTION W/PHACO Right 01/11/2018   Procedure: CATARACT EXTRACTION PHACO AND INTRAOCULAR LENS PLACEMENT (IOC);  Surgeon: [name], MD;  Location: [facility] ORS;  Service: Ophthalmology;  Laterality: Right;  [place] 00:57.5 AP% 15.0 CDE 8.61 Fluid Pack Lot # [id number]  . CATARACT EXTRACTION W/PHACO Left 02/15/2018   Procedure: CATARACT EXTRACTION PHACO AND INTRAOCULAR LENS PLACEMENT (IOC);  Surgeon: [name], MD;  Location: [facility] ORS;  Service: Ophthalmology;  Laterality: Left;  [place] 00:51 AP% 13.2 CDE 6.82 Fluid p ack lot # [id number]  . CHOLECYSTECTOMY N/A 08/04/2019   Procedure: LAPAROSCOPIC CHOLECYSTECTOMY;  Surgeon: [name], MD;  Location: [facility];  Service: General;  Laterality: N/A;  . COLONOSCOPY    . ENDOSCOPIC RETROGRADE CHOLANGIOPANCREATOGRAPHY (ERCP) WITH PROPOFOL N/A 11/07/2019   Procedure: ENDOSCOPIC RETROGRADE CHOLANGIOPANCREATOGRAPHY (ERCP) WITH PROPOFOL;  Surgeon: [name], MD;  Location: [facility];  Service: Endoscopy;  Laterality: N/A;  . ENDOSCOPIC RETROGRADE CHOLANGIOPANCREATOGRAPHY (ERCP) WITH PROPOFOL N/A 05/13/2020   Procedure: ENDOSCOPIC RETROGRADE CHOLANGIOPANCREATOGRAPHY (ERCP) WITH PROPOFOL;  Surgeon: [name], MD;  Location: [facility];  Service: Endoscopy;  Laterality: N/A;  . ERCP N/A 08/07/2019   Procedure: ENDOSCOPIC RETROGRADE CHOLANGIOPANCREATOGRAPHY (ERCP);  Surgeon: [name], MD;  Location: [facility];  Service: Endoscopy;  Laterality: N/A;  . RCR Bilateral   . REMOVAL OF STONES  08/07/2019    Procedure: REMOVAL OF STONES;  Surgeon: [name], MD;  Location: [facility];  Service: Endoscopy;;  balloon sweep, no stones  . SPHINCTEROTOMY  08/07/2019   Procedure: SPHINCTEROTOMY;  Surgeon: [name], MD;  Location: [facility];  Service: Endoscopy;;  . STENT REMOVAL  11/07/2019   Procedure: STENT REMOVAL;  Surgeon: [name], MD;  Location: [facility];  Service: Endoscopy;;  . STENT REMOVAL  05/13/2020   Procedure: STENT REMOVAL;  Surgeon: [name], MD;  Location: [facility];  Service: Endoscopy;;    Social History:  reports that she has never smoked. She has never used smokeless tobacco. She reports that she does not drink alcohol and does not use drugs.  Allergies  Allergen Reactions  . Talwin [Pentazocine]     Turned blue around lips and face, rash all over body    Family History  Problem Relation Age of Onset  . Diabetes Mother   . Heart failure Mother   . Depression Mother   . Colon cancer Neg Hx   . Stomach cancer Neg Hx   . Esophageal cancer Neg Hx   . Pancreatic cancer Neg Hx      Prior to Admission medications   Medication Sig Start Date End Date Taking? Authorizing Provider  albuterol (VENTOLIN HFA) 108 (90 Base)  MCG/ACT inhaler Inhale 2 puffs into the lungs every 6 (six) hours as needed for shortness of breath or wheezing. 02/27/20  Yes [provider]  brexpiprazole (REXULTI) 2 MG TABS tablet Take 2 mg by mouth at bedtime.   Yes [provider]  calcium carbonate (OSCAL) 1500 (600 Ca) MG TABS tablet Take 600 mg of elemental calcium by mouth daily with breakfast.   Yes [provider]  Empagliflozin-metFORMIN HCl ER (SYNJARDY XR) 25-1000 MG TB24 Take 1 tablet by mouth daily before breakfast.  11/15/18  Yes [provider]  ezetimibe (ZETIA) 10 MG tablet Take 10 mg by mouth daily. 04/25/20  Yes [provider]  ferrous sulfate 325 (65 FE) MG tablet Take 325 mg by mouth daily with  breakfast.   Yes [provider]  Insulin Glargine (BASAGLAR KWIKPEN) 100 UNIT/ML Inject 38 Units into the skin at bedtime. 03/20/20  Yes [provider]  LINZESS 290 MCG CAPS capsule Take 290 mcg by mouth every morning. 08/16/19  Yes [provider]  lisinopril (ZESTRIL) 20 MG tablet TAKE 1 TABLET BY MOUTH EVERY DAY Patient taking differently: Take 20 mg by mouth daily. 02/08/20  Yes [name], MD  metFORMIN (GLUCOPHAGE) 1000 MG tablet Take 1,000 mg by mouth at bedtime. 02/27/20  Yes [provider]  Omega-3 Fatty Acids (OMEGA 3 PO) Take 500 mg by mouth daily.    Yes [provider]  prazosin (MINIPRESS) 2 MG capsule Take 4 mg by mouth at bedtime.   Yes [provider]  pregabalin (LYRICA) 100 MG capsule TAKE 1 CAPSULE BY MOUTH THREE TIMES A DAY Patient taking differently: Take 100 mg by mouth 3 (three) times daily. 02/12/20  Yes [name], NP  QUEtiapine (SEROQUEL) 400 MG tablet Take 800 mg by mouth at bedtime.   Yes [provider]  simvastatin (ZOCOR) 10 MG tablet Take 10 mg by mouth daily before breakfast.  12/13/17  Yes [provider]  topiramate (TOPAMAX) 200 MG tablet Take 200 mg by mouth 2 (two) times daily. 04/12/20  Yes [provider]  traMADol ([name]) 50 MG tablet Take 50 mg by mouth daily as needed for moderate pain.  01/13/19  Yes [provider]  vortioxetine HBr ([name]) 20 MG TABS tablet Take 20 mg by mouth at bedtime.   Yes [provider]  ziprasidone ([name]) 20 MG capsule Take 20 mg by mouth at bedtime.   Yes [provider]  Dulaglutide 4.5 MG/0.5ML SOPN Inject 4 mg into the skin once a week. Monday 12/20/18   [provider]    Physical Exam: Vitals:   05/20/20 1730 05/20/20 1815 05/20/20 1915 05/20/20 2036  BP: (!) 145/76 (!) 156/85 138/68 136/73  Pulse: 88 86 85 85  Resp: _0 Temp:    99 F (37.2 C)  TempSrc:    Oral  SpO2: 94%  97% 98% 98%   Constitutional: Resting supine in bed, NAD, calm, somewhat uncomfortable but otherwise appropriate Eyes: PERRL, lids and conjunctivae normal ENMT: Mucous membranes are moist. Posterior pharynx clear of any exudate or lesions.Normal dentition.  Neck: normal, supple, no masses. Respiratory: clear to auscultation bilaterally, no wheezing, no crackles. Normal respiratory effort. No accessory muscle use.  Cardiovascular: Regular rate and rhythm, no murmurs / rubs / gallops. No extremity edema. 2+ pedal pulses. Abdomen: Tender to palpation RLQ and RUQ, no masses palpated. Bowel sounds positive.  Musculoskeletal: no clubbing / cyanosis. No joint deformity upper  and lower extremities. Good ROM, no contractures. Normal muscle tone.  Skin: no rashes, lesions, ulcers. No induration Neurologic: CN 2-12 grossly intact. Sensation intact, Strength 5/5 in all 4.  Psychiatric: Normal judgment and insight. Alert and oriented x 3. Normal mood.   Labs on Admission: I have personally reviewed following labs and imaging studies  CBC: Recent Labs  Lab 05/20/20 1152  WBC 6.2  NEUTROABS 4.3  HGB 9.6*  HCT 30.2*  MCV 79.7  PLT 716.9   Basic Metabolic Panel: Recent Labs  Lab 05/20/20 1152  NA 138  K 4.3  CL 106  CO2 22  GLUCOSE 183*  BUN 25*  CREATININE 0.61  CALCIUM 9.2   GFR: Estimated Creatinine Clearance: 78.8 mL/min (by C-G formula based on SCr of 0.61 mg/dL). Liver Function Tests: Recent Labs  Lab 05/20/20 1152  AST 44*  ALT 41*  ALKPHOS 244*  BILITOT 0.3  PROT 7.6  ALBUMIN 3.7   Recent Labs  Lab 05/20/20 1152  LIPASE 101.0*   No results for input(s): AMMONIA in the last 168 hours. Coagulation Profile: Recent Labs  Lab 05/20/20 2100  INR 1.6*   Cardiac Enzymes: No results for input(s): CKTOTAL, CKMB, CKMBINDEX, TROPONINI in the last 168 hours. BNP (last 3 results) No results for input(s): PROBNP in the last 8760 hours. HbA1C: No results for input(s):  HGBA1C in the last 72 hours. CBG: No results for input(s): GLUCAP in the last 168 hours. Lipid Profile: No results for input(s): CHOL, HDL, LDLCALC, TRIG, CHOLHDL, LDLDIRECT in the last 72 hours. Thyroid Function Tests: No results for input(s): TSH, T4TOTAL, FREET4, T3FREE, THYROIDAB in the last 72 hours. Anemia Panel: No results for input(s): VITAMINB12, FOLATE, FERRITIN, TIBC, IRON, RETICCTPCT in the last 72 hours. Urine analysis:    Component Value Date/Time   COLORURINE YELLOW 05/20/2020 1656   APPEARANCEUR HAZY (A) 05/20/2020 1656   LABSPEC 1.022 05/20/2020 1656   PHURINE 5.0 05/20/2020 1656   GLUCOSEU >=500 (A) 05/20/2020 1656   HGBUR NEGATIVE 05/20/2020 1656   BILIRUBINUR NEGATIVE 05/20/2020 1656   KETONESUR NEGATIVE 05/20/2020 1656   PROTEINUR NEGATIVE 05/20/2020 1656   NITRITE POSITIVE (A) 05/20/2020 1656   LEUKOCYTESUR TRACE (A) 05/20/2020 1656    Radiological Exams on Admission: CT ABDOMEN PELVIS W CONTRAST  Result Date: 05/20/2020 CLINICAL DATA:  Recent ERCP with stent placement, prior cholecystectomy EXAM: CT ABDOMEN AND PELVIS WITH CONTRAST TECHNIQUE: Multidetector CT imaging of the abdomen and pelvis was performed using the standard protocol following bolus administration of intravenous contrast. CONTRAST:  140m OMNIPAQUE IOHEXOL 300 MG/ML  SOLN COMPARISON:  March 12, 2020 FINDINGS: Lower chest: The visualized heart size within normal limits. No pericardial fluid/thickening. No hiatal hernia. The visualized portions of the lungs are clear. Hepatobiliary: There is a slightly nodular liver contour which is diffusely low signal throughout. The portal vein appears to be patent. The patient is status post common bile duct stent placement. Within the area of the gallbladder bed there is a new multilocular fluid collection measuring approximately 4.6 x 2.6 cm. The adjacent cholecystectomy surgical clips are noted. A small amount of pneumobilia is noted. There appears to be  mild fat stranding changes seen adjacent to the gallbladder bed at the level of the proximal bile duct stent. No free air is noted. Pancreas: Unremarkable. No pancreatic ductal dilatation or surrounding inflammatory changes. Spleen: The spleen is mildly enlarged measuring 15 cm in craniocaudad dimension. Adrenals/Urinary Tract: Both adrenal glands appear normal. The kidneys and collecting system appear  normal without evidence of urinary tract calculus or hydronephrosis. Bladder is partially decompressed. Stomach/Bowel: The stomach, small bowel, and colon are normal in appearance. No inflammatory changes, wall thickening, or obstructive findings.A moderate to large amount of colonic stool is present throughout. The appendix is unremarkable. Vascular/Lymphatic: There are no enlarged mesenteric, retroperitoneal, or pelvic lymph nodes. No significant vascular findings are present. Reproductive: The patient is status post hysterectomy. No adnexal masses or collections seen. Other: No evidence of abdominal wall mass or hernia. Musculoskeletal: No acute or significant osseous findings. IMPRESSION: Multilocular fluid collection within the gallbladder bed measuring 4.6 x 2.6 cm which could be due to biloma, or postprocedural seroma. Mild inflammatory changes seen within the porta hepatis No definite evidence of pancreatitis Findings suggestive of cirrhosis and portal hypertension Electronically Signed   By: Prudencio Pair M.D.   On: 05/20/2020 19:13    EKG: Not performed.  Assessment/Plan Principal Problem:   Biloma Active Problems:   Insulin dependent type 2 diabetes mellitus ([place])   Hyperlipidemia associated with type 2 diabetes mellitus ([place])   Hypertension associated with diabetes ([place])   Other cirrhosis of liver ([place])   Bile leak  Kim Hicks is a 54 y.o. female with medical history significant for liver cirrhosis, insulin-dependent type 2 diabetes, hypertension, hyperlipidemia, anemia of chronic disease,  depression/anxiety who is admitted for biloma.  Biloma versus postop seroma Lap cholecystectomy s/p persistent bile leak s/p most recent ERCP and stent placement to CBD 05/13/2020: Multiloculated fluid collection seen in gallbladder bed on CT imaging.  Patient having continued right-sided abdominal pain. -GI consulted and to see in a.m. -IR request for percutaneous drain placed -Keep n.p.o. after midnight and hold VTE prophylaxis for now -Continue empiric IV Zosyn -Continue pain control with morphine as needed with hold parameters  Liver cirrhosis: Stable without evidence of ascites or encephalopathy.  Insulin-dependent type 2 diabetes: Place on reduced home Lantus 15 units nightly plus sensitive SSI.  Hold other home meds for now.  UTI: Patient with increased urinary frequency and abnormal urinalysis.  Has been placed on IV Zosyn as above which should cover common causes of bacterial UTI.  Follow urine culture.  Hypertension: Continue lisinopril.  Also on prazosin nightly for nightmares.  Hyperlipidemia: Continue statin, Zetia.  Anemia of chronic disease and iron deficiency: Hemoglobin slightly decreased from 3 months ago but otherwise at baseline compared to previous labs.  Continue to monitor.  Depression/anxiety: Patient with documented history of severe depression on multiple medications.  Mood currently stable. -Continue home Rexulti, Seroquel, [name], [name]  DVT prophylaxis: SCDs for now pending potential IR procedure Code Status: Full code, confirmed with patient Family Communication: Discussed with patient, she has discussed with family Disposition Plan: From home and likely discharge to home pending further GI evaluation and potential IR drain placement Consults called: GI consulted by EDP, IR order for drain/catheter placed Admission status:  Status is: Observation  The patient remains OBS appropriate and will d/c before 2 midnights.  Dispo: The patient is  from: Home              Anticipated d/c is to: Home              Anticipated d/c date is: 1 day              Patient currently is not medically stable to d/c.  [name] MD [facility]  If 7PM-7AM, please contact night-coverage [email or web address]  05/20/2020, 10:00 PM

## 2020-05-21 ENCOUNTER — Observation Stay (HOSPITAL_COMMUNITY): Payer: Medicare Other

## 2020-05-21 ENCOUNTER — Other Ambulatory Visit: Payer: Self-pay

## 2020-05-21 DIAGNOSIS — Z4659 Encounter for fitting and adjustment of other gastrointestinal appliance and device: Secondary | ICD-10-CM | POA: Diagnosis not present

## 2020-05-21 DIAGNOSIS — Z888 Allergy status to other drugs, medicaments and biological substances status: Secondary | ICD-10-CM | POA: Diagnosis not present

## 2020-05-21 DIAGNOSIS — Z8249 Family history of ischemic heart disease and other diseases of the circulatory system: Secondary | ICD-10-CM | POA: Diagnosis not present

## 2020-05-21 DIAGNOSIS — K810 Acute cholecystitis: Secondary | ICD-10-CM | POA: Diagnosis not present

## 2020-05-21 DIAGNOSIS — R61 Generalized hyperhidrosis: Secondary | ICD-10-CM | POA: Diagnosis present

## 2020-05-21 DIAGNOSIS — R1011 Right upper quadrant pain: Secondary | ICD-10-CM

## 2020-05-21 DIAGNOSIS — F419 Anxiety disorder, unspecified: Secondary | ICD-10-CM | POA: Diagnosis present

## 2020-05-21 DIAGNOSIS — Z833 Family history of diabetes mellitus: Secondary | ICD-10-CM | POA: Diagnosis not present

## 2020-05-21 DIAGNOSIS — R109 Unspecified abdominal pain: Secondary | ICD-10-CM | POA: Diagnosis present

## 2020-05-21 DIAGNOSIS — Z79899 Other long term (current) drug therapy: Secondary | ICD-10-CM | POA: Diagnosis not present

## 2020-05-21 DIAGNOSIS — F323 Major depressive disorder, single episode, severe with psychotic features: Secondary | ICD-10-CM | POA: Diagnosis present

## 2020-05-21 DIAGNOSIS — K668 Other specified disorders of peritoneum: Secondary | ICD-10-CM

## 2020-05-21 DIAGNOSIS — D638 Anemia in other chronic diseases classified elsewhere: Secondary | ICD-10-CM | POA: Diagnosis present

## 2020-05-21 DIAGNOSIS — Z7984 Long term (current) use of oral hypoglycemic drugs: Secondary | ICD-10-CM | POA: Diagnosis not present

## 2020-05-21 DIAGNOSIS — E440 Moderate protein-calorie malnutrition: Secondary | ICD-10-CM | POA: Diagnosis present

## 2020-05-21 DIAGNOSIS — T85520D Displacement of bile duct prosthesis, subsequent encounter: Secondary | ICD-10-CM | POA: Diagnosis not present

## 2020-05-21 DIAGNOSIS — Y732 Prosthetic and other implants, materials and accessory gastroenterology and urology devices associated with adverse incidents: Secondary | ICD-10-CM | POA: Diagnosis present

## 2020-05-21 DIAGNOSIS — K5909 Other constipation: Secondary | ICD-10-CM | POA: Diagnosis present

## 2020-05-21 DIAGNOSIS — T85590A Other mechanical complication of bile duct prosthesis, initial encounter: Secondary | ICD-10-CM | POA: Diagnosis present

## 2020-05-21 DIAGNOSIS — Z90710 Acquired absence of both cervix and uterus: Secondary | ICD-10-CM | POA: Diagnosis not present

## 2020-05-21 DIAGNOSIS — E1140 Type 2 diabetes mellitus with diabetic neuropathy, unspecified: Secondary | ICD-10-CM | POA: Diagnosis present

## 2020-05-21 DIAGNOSIS — E785 Hyperlipidemia, unspecified: Secondary | ICD-10-CM | POA: Diagnosis present

## 2020-05-21 DIAGNOSIS — Z818 Family history of other mental and behavioral disorders: Secondary | ICD-10-CM | POA: Diagnosis not present

## 2020-05-21 DIAGNOSIS — K7469 Other cirrhosis of liver: Secondary | ICD-10-CM | POA: Diagnosis present

## 2020-05-21 DIAGNOSIS — N390 Urinary tract infection, site not specified: Secondary | ICD-10-CM | POA: Diagnosis present

## 2020-05-21 DIAGNOSIS — K839 Disease of biliary tract, unspecified: Secondary | ICD-10-CM | POA: Diagnosis not present

## 2020-05-21 DIAGNOSIS — K766 Portal hypertension: Secondary | ICD-10-CM | POA: Diagnosis present

## 2020-05-21 DIAGNOSIS — Z20822 Contact with and (suspected) exposure to covid-19: Secondary | ICD-10-CM | POA: Diagnosis present

## 2020-05-21 DIAGNOSIS — Z794 Long term (current) use of insulin: Secondary | ICD-10-CM | POA: Diagnosis not present

## 2020-05-21 DIAGNOSIS — E119 Type 2 diabetes mellitus without complications: Secondary | ICD-10-CM | POA: Diagnosis not present

## 2020-05-21 DIAGNOSIS — I152 Hypertension secondary to endocrine disorders: Secondary | ICD-10-CM | POA: Diagnosis present

## 2020-05-21 DIAGNOSIS — E1169 Type 2 diabetes mellitus with other specified complication: Secondary | ICD-10-CM | POA: Diagnosis present

## 2020-05-21 DIAGNOSIS — Z9049 Acquired absence of other specified parts of digestive tract: Secondary | ICD-10-CM | POA: Diagnosis not present

## 2020-05-21 LAB — URINALYSIS, ROUTINE W REFLEX MICROSCOPIC
Bilirubin Urine: NEGATIVE
Glucose, UA: >=500 mg/dL — AB
Hgb urine dipstick: NEGATIVE
Ketones, ur: 5 mg/dL — AB
Nitrite: NEGATIVE
Protein, ur: NEGATIVE mg/dL
Specific Gravity, Urine: 1.027 (ref 1.005–1.030)
pH: 5 (ref 5.0–8.0)

## 2020-05-21 LAB — CBC
HCT: 31.7 % — ABNORMAL LOW (ref 36.0–46.0)
Hemoglobin: 9.6 g/dL — ABNORMAL LOW (ref 12.0–15.0)
MCH: 25.7 pg — ABNORMAL LOW (ref 26.0–34.0)
MCHC: 30.3 g/dL (ref 30.0–36.0)
MCV: 85 fL (ref 80.0–100.0)
Platelets: 221 10*3/uL (ref 150–400)
RBC: 3.73 MIL/uL — ABNORMAL LOW (ref 3.87–5.11)
RDW: 15 % (ref 11.5–15.5)
WBC: 6.2 10*3/uL (ref 4.0–10.5)
nRBC: 0 % (ref 0.0–0.2)

## 2020-05-21 LAB — COMPREHENSIVE METABOLIC PANEL
ALT: 42 U/L (ref 0–44)
AST: 50 U/L — ABNORMAL HIGH (ref 15–41)
Albumin: 3.2 g/dL — ABNORMAL LOW (ref 3.5–5.0)
Alkaline Phosphatase: 228 U/L — ABNORMAL HIGH (ref 38–126)
Anion gap: 10 (ref 5–15)
BUN: 18 mg/dL (ref 6–20)
CO2: 21 mmol/L — ABNORMAL LOW (ref 22–32)
Calcium: 8.9 mg/dL (ref 8.9–10.3)
Chloride: 108 mmol/L (ref 98–111)
Creatinine, Ser: 0.47 mg/dL (ref 0.44–1.00)
GFR, Estimated: >60 mL/min (ref >60)
Glucose, Bld: 105 mg/dL — ABNORMAL HIGH (ref 70–99)
Potassium: 4.1 mmol/L (ref 3.5–5.1)
Sodium: 139 mmol/L (ref 135–145)
Total Bilirubin: 0.3 mg/dL (ref 0.3–1.2)
Total Protein: 7.3 g/dL (ref 6.5–8.1)

## 2020-05-21 LAB — HEMOGLOBIN A1C
Hgb A1c MFr Bld: 8.2 % — ABNORMAL HIGH (ref 4.8–5.6)
Mean Plasma Glucose: 188.64 mg/dL

## 2020-05-21 LAB — CBG MONITORING, ED
Glucose-Capillary: 97 mg/dL (ref 70–99)
Glucose-Capillary: 82 mg/dL (ref 70–99)

## 2020-05-21 LAB — GLUCOSE, CAPILLARY: Glucose-Capillary: 102 mg/dL — ABNORMAL HIGH (ref 70–99)

## 2020-05-21 MED ORDER — KETOROLAC TROMETHAMINE 30 MG/ML IJ SOLN
30.0000 mg | Freq: Four times a day (QID) | INTRAMUSCULAR | Status: AC | PRN
  Administered 2020-05-21 – 2020-05-26 (×5): 30 mg via INTRAVENOUS
  Filled 2020-05-21 (×5): qty 1

## 2020-05-21 MED ORDER — OXYCODONE HCL 5 MG PO TABS
10.0000 mg | ORAL_TABLET | Freq: Four times a day (QID) | ORAL | Status: DC | PRN
  Administered 2020-05-22 – 2020-05-30 (×24): 10 mg via ORAL
  Filled 2020-05-21 (×25): qty 2

## 2020-05-21 MED ORDER — MORPHINE SULFATE (PF) 2 MG/ML IV SOLN
2.0000 mg | INTRAVENOUS | Status: DC | PRN
  Administered 2020-05-21 (×3): 2 mg via INTRAVENOUS
  Administered 2020-05-22: 3 mg via INTRAVENOUS
  Administered 2020-05-22 (×2): 4 mg via INTRAVENOUS
  Administered 2020-05-22: 2 mg via INTRAVENOUS
  Administered 2020-05-23 – 2020-05-25 (×12): 4 mg via INTRAVENOUS
  Administered 2020-05-26: 2 mg via INTRAVENOUS
  Administered 2020-05-26: 4 mg via INTRAVENOUS
  Administered 2020-05-27 – 2020-05-29 (×3): 2 mg via INTRAVENOUS
  Filled 2020-05-21: qty 2
  Filled 2020-05-21: qty 1
  Filled 2020-05-21 (×4): qty 2
  Filled 2020-05-21: qty 1
  Filled 2020-05-21 (×2): qty 2
  Filled 2020-05-21: qty 1
  Filled 2020-05-21 (×3): qty 2
  Filled 2020-05-21: qty 1
  Filled 2020-05-21: qty 2
  Filled 2020-05-21: qty 1
  Filled 2020-05-21: qty 2
  Filled 2020-05-21: qty 1
  Filled 2020-05-21: qty 2
  Filled 2020-05-21: qty 1
  Filled 2020-05-21 (×2): qty 2
  Filled 2020-05-21 (×2): qty 1
  Filled 2020-05-21: qty 2

## 2020-05-21 MED ORDER — NITROFURANTOIN MONOHYD MACRO 100 MG PO CAPS
100.0000 mg | ORAL_CAPSULE | Freq: Two times a day (BID) | ORAL | Status: DC
  Administered 2020-05-22: 100 mg via ORAL
  Filled 2020-05-21 (×3): qty 1

## 2020-05-21 MED ORDER — OXYCODONE HCL 5 MG PO TABS
10.0000 mg | ORAL_TABLET | Freq: Once | ORAL | Status: AC
  Administered 2020-05-21: 10 mg via ORAL
  Filled 2020-05-21: qty 2

## 2020-05-21 MED ORDER — SACCHAROMYCES BOULARDII 250 MG PO CAPS
250.0000 mg | ORAL_CAPSULE | Freq: Two times a day (BID) | ORAL | Status: DC
  Administered 2020-05-21 – 2020-05-30 (×18): 250 mg via ORAL
  Filled 2020-05-21 (×18): qty 1

## 2020-05-21 MED ORDER — LACTATED RINGERS IV SOLN: INTRAVENOUS | Status: DC

## 2020-05-21 MED ORDER — SORBITOL 70 % SOLN
960.0000 mL | TOPICAL_OIL | Freq: Once | ORAL | Status: DC
  Filled 2020-05-21: qty 473

## 2020-05-21 MED ORDER — FLUCONAZOLE 150 MG PO TABS
150.0000 mg | ORAL_TABLET | ORAL | Status: DC
  Administered 2020-05-22 – 2020-05-23 (×2): 150 mg via ORAL
  Filled 2020-05-21 (×3): qty 1

## 2020-05-21 MED ORDER — TECHNETIUM TC 99M MEBROFENIN IV KIT
5.1000 | PACK | Freq: Once | INTRAVENOUS | Status: AC | PRN
  Administered 2020-05-21: 5.1 via INTRAVENOUS

## 2020-05-21 MED ORDER — INSULIN ASPART 100 UNIT/ML ~~LOC~~ SOLN
0.0000 [IU] | SUBCUTANEOUS | Status: DC
  Filled 2020-05-21: qty 0.09

## 2020-05-21 MED ORDER — BISACODYL 10 MG RE SUPP
10.0000 mg | Freq: Once | RECTAL | Status: DC
  Filled 2020-05-21 (×2): qty 1

## 2020-05-21 NOTE — ED Notes (Signed)
CBG 97

## 2020-05-21 NOTE — Progress Notes (Signed)
[facility] Progress Note  Patient: Kim Hicks    KDT:[id number]  DOA: 05/20/2020     Date of Service: the patient was seen and examined on 05/21/2020  Brief hospital course: Past medical history of cirrhosis, IDDM, HTN, HLD, anemia, depression, anxiety.  Presents with complaints of right-sided back pain and flank pain.  Found to have biloma although suspect that her abdominal pain is coming from her constipation. Currently plan is pain control constipation controlled and monitor recommendation from GI.  Assessment and Plan: Biloma versus postop seroma Lap cholecystectomy s/p persistent bile leak s/p most recent ERCP and stent placement to CBD 05/13/2020: Multiloculated fluid collection seen in gallbladder bed on CT imaging.  Patient having continued right-sided abdominal pain. GI was consulted. IR was consulted for potential drain placement.  IR felt that the fluid collection is not significant enough and appears to be sterile and therefore does not require any intervention right now. Recommend bile leak nuclear medicine study. Based on this recommendation nuclear medicine bile leak study was performed which was also negative. GI continue to recommend conservative measures production patient was started on IV Zosyn with the concern for infection which we will continue for now. Continue as needed pain control and nausea control as well.  Severe constipation. Patient has chronic severe constipation which likely is the cause for her abdominal pain. We will provide smog enema. Further management per GI.  Liver cirrhosis: Stable without evidence of ascites or encephalopathy.  Insulin-dependent type 2 diabetes: Home insulin dose 38 units of Lantus. Currently on hold. Monitor Q4 CBG.  UTI: Patient with increased urinary frequency and abnormal urinalysis.  Has been placed on IV Zosyn as above which should cover common causes of bacterial UTI.  Follow urine  culture.  Hypertension: Continue lisinopril.  Also on prazosin nightly for nightmares.  Hyperlipidemia: Continue statin, Zetia.  Anemia of chronic disease and iron deficiency: Hemoglobin slightly decreased from 3 months ago but otherwise at baseline compared to previous labs.  Continue to monitor.  Depression/anxiety: Patient with documented history of severe depression on multiple medications.  Mood currently stable. -Continue home Rexulti, Seroquel, [name], [name]  Diet: Clear liquid diet DVT Prophylaxis:   SCDs Start: 05/20/20 2228   Advance goals of care discussion: Full code  Family Communication: family was present at bedside, at the time of interview.  The pt provided permission to discuss medical plan with the family. Opportunity was given to ask question and all questions were answered satisfactorily.   Disposition:  Status is: Inpatient  Remains inpatient appropriate because:IV treatments appropriate due to intensity of illness or inability to take PO and Inpatient level of care appropriate due to severity of illness  Dispo: The patient is from: Home              Anticipated d/c is to: Home              Anticipated d/c date is: 3 days              Patient currently is not medically stable to d/c.  Subjective: Reports severe abdominal pain.  Also reports nausea.  No vomiting.  No chest pain.  Passing gas but no BM for last 1 week.  Physical Exam:  General: Appear in moderate distress, no Rash; Oral Mucosa Clear, moist. no Abnormal Neck Mass Or lumps, Conjunctiva normal  Cardiovascular: S1 and S2 Present, no Murmur, Respiratory: good respiratory effort, Bilateral Air entry present and CTA, no Crackles, no wheezes Abdomen: Bowel  Sound present, Soft and diffuse tenderness Extremities: no Pedal edema Neurology: alert and oriented to time, place, and person affect appropriate. no new focal deficit Gait not checked due to patient safety concerns  Vitals:    05/21/20 1508 05/21/20 1815 05/21/20 1817 05/21/20 2008  BP: 124/73  119/62 118/70  Pulse: 90  85 84  Resp: 17  20 16   Temp:   98 F (36.7 C) 98.3 F (36.8 C)  TempSrc:   Oral Oral  SpO2: 97%  97% 93%  Weight:  72.4 kg    Height:  5' 4"  (1.626 m)      Intake/Output Summary (Last 24 hours) at 05/21/2020 2038 Last data filed at 05/21/2020 1658 Gross per 24 hour  Intake 50 ml  Output --  Net 50 ml   Filed Weights   05/21/20 1815  Weight: 72.4 kg    Data Reviewed: I have personally reviewed and interpreted daily labs, tele strips, imagings as discussed above. I reviewed all nursing notes, pharmacy notes, vitals, pertinent old records I have discussed plan of care as described above with RN and patient/family.  CBC: Recent Labs  Lab 05/20/20 1152 05/21/20 0506  WBC 6.2 6.2  NEUTROABS 4.3  --   HGB 9.6* 9.6*  HCT 30.2* 31.7*  MCV 79.7 85.0  PLT 222.0 962   Basic Metabolic Panel: Recent Labs  Lab 05/20/20 1152 05/21/20 0506  NA 138 139  K 4.3 4.1  CL 106 108  CO2 22 21*  GLUCOSE 183* 105*  BUN 25* 18  CREATININE 0.61 0.47  CALCIUM 9.2 8.9    Studies: NM HEPATO BILIARY LEAK  Result Date: 05/21/2020 CLINICAL DATA:  Status post cholecystectomy with posterior gallbladder wall left in situ, persistent bile leak status post clogged stent removal with metallic stent placement on 05/13/2020 EXAM: NUCLEAR MEDICINE HEPATOBILIARY IMAGING TECHNIQUE: Sequential images of the abdomen were obtained out to 60 minutes following intravenous administration of radiopharmaceutical. RADIOPHARMACEUTICALS:  5.1 mCi Tc-74m Choletec IV COMPARISON:  None. FINDINGS: Prompt uptake and biliary excretion of activity by the liver is seen. Biliary activity passes into small bowel, consistent with patent common bile duct. No evidence of mild leak. IMPRESSION: No evidence of bile leak. Electronically Signed   By: SJulian HyM.D.   On: 05/21/2020 11:56    Scheduled Meds: . bisacodyl  10  mg Rectal Once  . brexpiprazole  2 mg Oral QHS  . ezetimibe  10 mg Oral Daily  . insulin aspart  0-9 Units Subcutaneous Q4H  . QUEtiapine  800 mg Oral QHS  . saccharomyces boulardii  250 mg Oral BID  . simvastatin  10 mg Oral QAC breakfast  . topiramate  200 mg Oral BID  . vortioxetine HBr  20 mg Oral QHS  . ziprasidone  20 mg Oral QHS   Continuous Infusions: . lactated ringers 75 mL/hr at 05/21/20 2005  . piperacillin-tazobactam (ZOSYN)  IV Stopped (05/21/20 1658)   PRN Meds: acetaminophen **OR** acetaminophen, albuterol, ketorolac, morphine injection, ondansetron **OR** ondansetron (ZOFRAN) IV, oxyCODONE  Time spent: 35 minutes  Author: PBerle Mull MD [facility] 05/21/2020 8:38 PM  To reach On-call, see care teams to locate the attending and reach out via [email or web address] Between 7PM-7AM, please contact night-coverage If you still have difficulty reaching the attending provider, please page the [facility](Director on Call) for [facility] on amion for assistance.

## 2020-05-21 NOTE — Progress Notes (Signed)
Called RN in ED for report. RN to call floor back when available.

## 2020-05-21 NOTE — Progress Notes (Addendum)
Progress Note  Chief Complaint:   Abdominal pain       ASSESSMENT / PLAN:    # 54 yo female with HTN, DM2, chronic constipation, cirrhosis ( probably NASH), cholelithiasis s/p cholecystectomy March 8185 complicated by cystic duct leak. She is ERCP with stent placement 08/07/19 and again in June 2021. Both times there was a persistent bile duct leak / occluded biliary stent.  Repeat ERCP on 12/20 again showed persistent cystic duct leak and occluded plastic biliary stent. Stent removed and a fully covered metal biliary stent  stent placed. Sludge removed from CBD. Patient now admitted with severe RUQ pain, lipase of 101. CT scan shows cirrhosis with portal HTN,  multilocular fluid collection 4.6 x 2.6 cm in gallbladder bed. No radiographic evidence for pancreatitis.  --Etiology of pain may be mild post-ERCP pancreatitis though majority of pain in RUQ and suspect it is secondary to the biloma. Hopefully the cystic duct leak will eventually heal after placement of covered metal stent.  In the interim will ask IR to see regarding drainage. Will need fluid sent for cultures.   --WBC normal. Afebrile --On Zosyn..    # Cirrhosis with portal HTN ( splenomegaly) on imaging. Appears compensated.  --INR 1.6, platelets 221. No ascites on imaging.  --Normal AFP in March 2021. No mention of lesion lesions on CT scan w/ contrast yesterday --Varices screening can be addressed as outpatient  # Colon cancer screening  --Eventual colonoscopy with 2 day bowel prep. Prep was poor on last colonoscopy in Jan 2021.       SUBJECTIVE:   Having significant RUQ pain. She has back problems which sometimes causes shooting pains in left abdomen but this is not new. Some nausea.    OBJECTIVE:    Scheduled inpatient medications:  . brexpiprazole  2 mg Oral QHS  . ezetimibe  10 mg Oral Daily  . insulin aspart  0-9 Units Subcutaneous Q4H  . QUEtiapine  800 mg Oral QHS  . simvastatin  10 mg Oral QAC breakfast   . sorbitol, milk of mag, mineral oil, glycerin (SMOG) enema  960 mL Rectal Once  . topiramate  200 mg Oral BID  . vortioxetine HBr  20 mg Oral QHS  . ziprasidone  20 mg Oral QHS   Continuous inpatient infusions:  . lactated ringers    . piperacillin-tazobactam (ZOSYN)  IV 3.375 g (05/21/20 0752)   PRN inpatient medications: acetaminophen **OR** acetaminophen, albuterol, ketorolac, morphine injection, ondansetron **OR** ondansetron (ZOFRAN) IV  Vital signs in last 24 hours: Temp:  [97.7 F (36.5 C)-99 F (37.2 C)] 97.7 F (36.5 C) (12/28 0724) Pulse Rate:  [76-94] 93 (12/28 0933) Resp:  [12-20] 19 (12/28 0933) BP: (108-156)/(61-85) 111/65 (12/28 0933) SpO2:  [94 %-99 %] 97 % (12/28 0933) Weight:  [74.9 kg] 74.9 kg (12/27 1322)   No intake or output data in the 24 hours ending 05/21/20 0941   Physical Exam:  . General: Alert female in NAD . Heart:  Regular rate and rhythm. No lower extremity edema . Pulmonary: Normal respiratory effort . Abdomen: Soft, protuberant, significant epigastric and RUQ tenderness. Mild LUQ tenderness. Decreased bowel sounds. nondistended, nontender.  . Neurologic: Alert and oriented . Psych: Pleasant. Cooperative.   There were no vitals filed for this visit.  Intake/Output from previous day: No intake/output data recorded. Intake/Output this shift: No intake/output data recorded.    Lab Results: Recent Labs    05/20/20 1152 05/21/20 0506  WBC 6.2 6.2  HGB 9.6* 9.6*  HCT 30.2* 31.7*  PLT 222.0 221   BMET Recent Labs    05/20/20 1152 05/21/20 0506  NA 138 139  K 4.3 4.1  CL 106 108  CO2 22 21*  GLUCOSE 183* 105*  BUN 25* 18  CREATININE 0.61 0.47  CALCIUM 9.2 8.9   LFT Recent Labs    05/20/20 1152 05/21/20 0506  PROT 7.6 7.3  ALBUMIN 3.7 3.2*  AST 44* 50*  ALT 41* 42  ALKPHOS 244* 228*  BILITOT 0.3 0.3  BILIDIR 0.1  --    PT/INR Recent Labs    05/20/20 2100  LABPROT 18.0*  INR 1.6*   Hepatitis Panel No  results for input(s): HEPBSAG, HCVAB, HEPAIGM, HEPBIGM in the last 72 hours.  CT ABDOMEN PELVIS W CONTRAST  Result Date: 05/20/2020 CLINICAL DATA:  Recent ERCP with stent placement, prior cholecystectomy EXAM: CT ABDOMEN AND PELVIS WITH CONTRAST TECHNIQUE: Multidetector CT imaging of the abdomen and pelvis was performed using the standard protocol following bolus administration of intravenous contrast. CONTRAST:  138m OMNIPAQUE IOHEXOL 300 MG/ML  SOLN COMPARISON:  March 12, 2020 FINDINGS: Lower chest: The visualized heart size within normal limits. No pericardial fluid/thickening. No hiatal hernia. The visualized portions of the lungs are clear. Hepatobiliary: There is a slightly nodular liver contour which is diffusely low signal throughout. The portal vein appears to be patent. The patient is status post common bile duct stent placement. Within the area of the gallbladder bed there is a new multilocular fluid collection measuring approximately 4.6 x 2.6 cm. The adjacent cholecystectomy surgical clips are noted. A small amount of pneumobilia is noted. There appears to be mild fat stranding changes seen adjacent to the gallbladder bed at the level of the proximal bile duct stent. No free air is noted. Pancreas: Unremarkable. No pancreatic ductal dilatation or surrounding inflammatory changes. Spleen: The spleen is mildly enlarged measuring 15 cm in craniocaudad dimension. Adrenals/Urinary Tract: Both adrenal glands appear normal. The kidneys and collecting system appear normal without evidence of urinary tract calculus or hydronephrosis. Bladder is partially decompressed. Stomach/Bowel: The stomach, small bowel, and colon are normal in appearance. No inflammatory changes, wall thickening, or obstructive findings.A moderate to large amount of colonic stool is present throughout. The appendix is unremarkable. Vascular/Lymphatic: There are no enlarged mesenteric, retroperitoneal, or pelvic lymph nodes. No  significant vascular findings are present. Reproductive: The patient is status post hysterectomy. No adnexal masses or collections seen. Other: No evidence of abdominal wall mass or hernia. Musculoskeletal: No acute or significant osseous findings. IMPRESSION: Multilocular fluid collection within the gallbladder bed measuring 4.6 x 2.6 cm which could be due to biloma, or postprocedural seroma. Mild inflammatory changes seen within the porta hepatis No definite evidence of pancreatitis Findings suggestive of cirrhosis and portal hypertension Electronically Signed   By: BPrudencio PairM.D.   On: 05/20/2020 19:13      Principal Problem:   Biloma Active Problems:   Insulin dependent type 2 diabetes mellitus ([place]   Hyperlipidemia associated with type 2 diabetes mellitus ([place]   Hypertension associated with diabetes ([place]   Other cirrhosis of liver ([place]   Bile leak     LOS: 0 days   PTye Savoy,NP 05/21/2020, 9:41 AM

## 2020-05-21 NOTE — Progress Notes (Signed)
54 y.o. female inpatient (in the ED) History of HTN, HLD, DM, cirrhosis s/p cholecystectomy on 7.86.75 complicated by bile leak  followed by ERCP with stent placement on 6.15.21 last exchanged on  12.20.21. Patient presented to the ED at [facility] on 12.27.21  with worsening right sided pain with concern for possible biliary stent migration. CT Abd pelvis from 12.27.21 reads Multilocular fluid collection within the gallbladder bed measuring  4.6 x 2.6 cm which could be due to biloma, or postprocedural seroma. Team is requesting an abscess drain placement to the biloma. WBC is 6.2. The patient is afebrile.   After review of case with IR Attending Dr. Pilar Jarvis the biloma in question is small and difficult to access percuatously. Without suspicion of infection IR would not recommend drain placement. Should Team be concerned for the possibility of an  active bile leak that could be associated with the patient's pain a NM study could be initiated. Team made aware.

## 2020-05-21 NOTE — Progress Notes (Signed)
Patient complaining of itching, burning and painful urination. Believes she has a yeast infection from being on antibiotics.  Notified Sharlet Salina, APP who stated she'll order a UA and probiotics.  Patient notified. Verbalized understanding plan of care.

## 2020-05-22 DIAGNOSIS — K668 Other specified disorders of peritoneum: Secondary | ICD-10-CM | POA: Diagnosis not present

## 2020-05-22 LAB — COMPREHENSIVE METABOLIC PANEL
ALT: 38 U/L (ref 0–44)
AST: 53 U/L — ABNORMAL HIGH (ref 15–41)
Albumin: 3.2 g/dL — ABNORMAL LOW (ref 3.5–5.0)
Alkaline Phosphatase: 216 U/L — ABNORMAL HIGH (ref 38–126)
Anion gap: 9 (ref 5–15)
BUN: 22 mg/dL — ABNORMAL HIGH (ref 6–20)
CO2: 23 mmol/L (ref 22–32)
Calcium: 8.7 mg/dL — ABNORMAL LOW (ref 8.9–10.3)
Chloride: 107 mmol/L (ref 98–111)
Creatinine, Ser: 0.58 mg/dL (ref 0.44–1.00)
GFR, Estimated: >60 mL/min (ref >60)
Glucose, Bld: 105 mg/dL — ABNORMAL HIGH (ref 70–99)
Potassium: 4.2 mmol/L (ref 3.5–5.1)
Sodium: 139 mmol/L (ref 135–145)
Total Bilirubin: 0.6 mg/dL (ref 0.3–1.2)
Total Protein: 6.9 g/dL (ref 6.5–8.1)

## 2020-05-22 LAB — GLUCOSE, CAPILLARY
Glucose-Capillary: 101 mg/dL — ABNORMAL HIGH (ref 70–99)
Glucose-Capillary: 109 mg/dL — ABNORMAL HIGH (ref 70–99)
Glucose-Capillary: 135 mg/dL — ABNORMAL HIGH (ref 70–99)
Glucose-Capillary: 138 mg/dL — ABNORMAL HIGH (ref 70–99)
Glucose-Capillary: 148 mg/dL — ABNORMAL HIGH (ref 70–99)
Glucose-Capillary: 88 mg/dL (ref 70–99)
Glucose-Capillary: 94 mg/dL (ref 70–99)

## 2020-05-22 LAB — CBC WITH DIFFERENTIAL/PLATELET
Abs Immature Granulocytes: 0.05 10*3/uL (ref 0.00–0.07)
Basophils Absolute: 0 10*3/uL (ref 0.0–0.1)
Basophils Relative: 0 %
Eosinophils Absolute: 0.3 10*3/uL (ref 0.0–0.5)
Eosinophils Relative: 4 %
HCT: 31.1 % — ABNORMAL LOW (ref 36.0–46.0)
Hemoglobin: 9.3 g/dL — ABNORMAL LOW (ref 12.0–15.0)
Immature Granulocytes: 1 %
Lymphocytes Relative: 24 %
Lymphs Abs: 1.5 10*3/uL (ref 0.7–4.0)
MCH: 26 pg (ref 26.0–34.0)
MCHC: 29.9 g/dL — ABNORMAL LOW (ref 30.0–36.0)
MCV: 86.9 fL (ref 80.0–100.0)
Monocytes Absolute: 0.5 10*3/uL (ref 0.1–1.0)
Monocytes Relative: 7 %
Neutro Abs: 4 10*3/uL (ref 1.7–7.7)
Neutrophils Relative %: 64 %
Platelets: 224 10*3/uL (ref 150–400)
RBC: 3.58 MIL/uL — ABNORMAL LOW (ref 3.87–5.11)
RDW: 14.9 % (ref 11.5–15.5)
WBC: 6.2 10*3/uL (ref 4.0–10.5)
nRBC: 0 % (ref 0.0–0.2)

## 2020-05-22 LAB — MAGNESIUM: Magnesium: 2.2 mg/dL (ref 1.7–2.4)

## 2020-05-22 LAB — LACTIC ACID, PLASMA: Lactic Acid, Venous: 0.7 mmol/L (ref 0.5–1.9)

## 2020-05-22 LAB — HIV ANTIBODY (ROUTINE TESTING W REFLEX): HIV Screen 4th Generation wRfx: NONREACTIVE

## 2020-05-22 MED ORDER — ENSURE ENLIVE PO LIQD
237.0000 mL | Freq: Two times a day (BID) | ORAL | Status: DC
  Administered 2020-05-24 – 2020-05-28 (×6): 237 mL via ORAL

## 2020-05-22 MED ORDER — VORTIOXETINE HBR 20 MG PO TABS
20.0000 mg | ORAL_TABLET | Freq: Every day | ORAL | Status: DC
Start: 2020-05-22 — End: 2020-05-22
  Filled 2020-05-22: qty 1

## 2020-05-22 MED ORDER — INSULIN ASPART 100 UNIT/ML ~~LOC~~ SOLN
0.0000 [IU] | Freq: Three times a day (TID) | SUBCUTANEOUS | Status: DC
  Administered 2020-05-22 – 2020-05-24 (×3): 1 [IU] via SUBCUTANEOUS
  Administered 2020-05-25: 2 [IU] via SUBCUTANEOUS
  Administered 2020-05-26: 7 [IU] via SUBCUTANEOUS
  Administered 2020-05-26: 5 [IU] via SUBCUTANEOUS
  Administered 2020-05-26: 1 [IU] via SUBCUTANEOUS
  Administered 2020-05-27: 2 [IU] via SUBCUTANEOUS

## 2020-05-22 MED ORDER — VORTIOXETINE HBR 5 MG PO TABS
20.0000 mg | ORAL_TABLET | Freq: Every day | ORAL | Status: DC
  Filled 2020-05-22: qty 4

## 2020-05-22 MED ORDER — PIPERACILLIN-TAZOBACTAM 3.375 G IVPB 30 MIN
3.3750 g | Freq: Four times a day (QID) | INTRAVENOUS | Status: DC
  Administered 2020-05-22 – 2020-05-25 (×11): 3.375 g via INTRAVENOUS
  Filled 2020-05-22 (×12): qty 50

## 2020-05-22 MED ORDER — VORTIOXETINE HBR 20 MG PO TABS
20.0000 mg | ORAL_TABLET | Freq: Every day | ORAL | Status: DC
  Administered 2020-05-22 – 2020-05-29 (×8): 20 mg via ORAL
  Filled 2020-05-22 (×8): qty 1

## 2020-05-22 MED ORDER — PROMETHAZINE HCL 25 MG/ML IJ SOLN
12.5000 mg | Freq: Four times a day (QID) | INTRAMUSCULAR | Status: DC | PRN
  Administered 2020-05-22: 12.5 mg via INTRAVENOUS
  Administered 2020-05-24: 25 mg via INTRAVENOUS
  Filled 2020-05-22 (×2): qty 1

## 2020-05-22 NOTE — Progress Notes (Signed)
PROGRESS NOTE   SVEA PUSCH  TMH:[id number] DOB: [birth year] DOA: 05/20/2020 PCP: [name], FNP  Brief Narrative:  54 year old white female from home HTN, HLD, depression with psychosis NIDDM complicated by diabetic neuropathy severe cirrhosis of liver Lap chole 08/04/2019-difficult case-backwall of gallbladder left in place-found to have a contained bile leak status post ERCP sphincterotomy and stent 08/07/2019 followed by [place] [facility]-was discharged on Augmentin  Subsequently 11/07/2019 underwent ERCP stent was exchanged in common bile duct with a new stent upsized 10 French plastic stent  In the interim was admitted at [facility] 03/11/2020 found to have E. coli in urine blood cultures 1 out of 4 bottles E. coli was treated with Levaquin Found to have RIb # 8th rib  Underwent a final repeat ERCP 12/20 with persistent bile leak clogged stent removed metal stent placed Developed on 12/21 progressive worsening R LQ pain diaphoresis chills-seen at GI clinic 12/27 and she was [facility] ED for imaging and further work-up  CT abdomen pelvis = multilocular fluid collection 4.6 X2.6 biloma/postprocedural seroma-GI consulted Zosyn started IR consulted   Assessment & Plan:   Principal Problem:   Biloma Active Problems:   Insulin dependent type 2 diabetes mellitus ([place])   Hyperlipidemia associated with type 2 diabetes mellitus ([place])   Hypertension associated with diabetes ([place])   Other cirrhosis of liver ([place])   Bile leak   Abdominal pain  Biloma versus postop seroma status post lap cholecystectomy Patient currently on max therapy pain medications orally and IV--at baseline does not take medications for pain other than for arthritis Discussed with IR/GI who will determine if a procedure may benefit the patient given high likelihood of recurrence and chronicity of this issue Insulin-dependent DM TY 2 with severe neuropathy CBG moderately well-controlled 88-1 10 eating only  25% of meals however Continue current sliding scale management at this time Holding Basaglar 38 Metformin 1000 at bedtime and Synjardy for now Cirrhosis Secondary to NAFLD?-Outpatient follow-up with GI HTN  continue lisinopril 20, prazosin 4 Outpatient further adjustment Anemia chronic disease  Currently stable Depression anxiety  Continue Topamax 200 twice daily, vortioxetine 20 Seroquel iii/day Rexulti 2 at bedtime  DVT prophylaxis: SCD Code Status: Full Family Communication: None Disposition:  Status is: Inpatient  Remains inpatient appropriate because:Hemodynamically unstable and Ongoing active pain requiring inpatient pain management   Dispo: The patient is from: Home              Anticipated d/c is to: Home              Anticipated d/c date is: 3 days              Patient currently is not medically stable to d/c.       Consultants:   GI  IR  Procedures: None  Antimicrobials: None   Subjective: Awake coherent but states pain is about 7/10 No fever no chills not really eating however Scheduled medications both oral and IV seemed to take away the pain but she is never without the same  Objective: Vitals:   05/21/20 1815 05/21/20 1817 05/21/20 2008 05/22/20 0540  BP:  119/62 118/70 119/73  Pulse:  85 84 87  Resp:  _0 Temp:  98 F (36.7 C) 98.3 F (36.8 C) (!) 97.4 F (36.3 C)  TempSrc:  Oral Oral Oral  SpO2:  97% 93% 95%  Weight: 72.4 kg     Height: _1  (1.626 m)  Intake/Output Summary (Last 24 hours) at 05/22/2020 0736 Last data filed at 05/22/2020 0549 Gross per 24 hour  Intake 1228.72 ml  Output 800 ml  Net 428.72 ml   Filed Weights   05/21/20 1815  Weight: 72.4 kg    Examination:  EOMI NCAT thick neck Mallampati 4 Chest clear no rales no rhonchi Abdomen quite tender in epigastrium some distention cannot appreciate organomegaly but exam limited given her pain No lower extremity edema Psych euthymic  Data Reviewed: I  have personally reviewed following labs and imaging studies Sodium 139 potassium 4.2 BUNs/creatinine 25/0.6--10.1/2.5 Alk phos 244-->216 AST/ALT = 44/41-->53/38 lactic acid 0.7   White count 6.2 hemoglobin 9.3 platelet 224   Radiology Studies: CT ABDOMEN PELVIS W CONTRAST  Result Date: 05/20/2020 CLINICAL DATA:  Recent ERCP with stent placement, prior cholecystectomy EXAM: CT ABDOMEN AND PELVIS WITH CONTRAST TECHNIQUE: Multidetector CT imaging of the abdomen and pelvis was performed using the standard protocol following bolus administration of intravenous contrast. CONTRAST:  125m OMNIPAQUE IOHEXOL 300 MG/ML  SOLN COMPARISON:  March 12, 2020 FINDINGS: Lower chest: The visualized heart size within normal limits. No pericardial fluid/thickening. No hiatal hernia. The visualized portions of the lungs are clear. Hepatobiliary: There is a slightly nodular liver contour which is diffusely low signal throughout. The portal vein appears to be patent. The patient is status post common bile duct stent placement. Within the area of the gallbladder bed there is a new multilocular fluid collection measuring approximately 4.6 x 2.6 cm. The adjacent cholecystectomy surgical clips are noted. A small amount of pneumobilia is noted. There appears to be mild fat stranding changes seen adjacent to the gallbladder bed at the level of the proximal bile duct stent. No free air is noted. Pancreas: Unremarkable. No pancreatic ductal dilatation or surrounding inflammatory changes. Spleen: The spleen is mildly enlarged measuring 15 cm in craniocaudad dimension. Adrenals/Urinary Tract: Both adrenal glands appear normal. The kidneys and collecting system appear normal without evidence of urinary tract calculus or hydronephrosis. Bladder is partially decompressed. Stomach/Bowel: The stomach, small bowel, and colon are normal in appearance. No inflammatory changes, wall thickening, or obstructive findings.A moderate to large  amount of colonic stool is present throughout. The appendix is unremarkable. Vascular/Lymphatic: There are no enlarged mesenteric, retroperitoneal, or pelvic lymph nodes. No significant vascular findings are present. Reproductive: The patient is status post hysterectomy. No adnexal masses or collections seen. Other: No evidence of abdominal wall mass or hernia. Musculoskeletal: No acute or significant osseous findings. IMPRESSION: Multilocular fluid collection within the gallbladder bed measuring 4.6 x 2.6 cm which could be due to biloma, or postprocedural seroma. Mild inflammatory changes seen within the porta hepatis No definite evidence of pancreatitis Findings suggestive of cirrhosis and portal hypertension Electronically Signed   By: BPrudencio PairM.D.   On: 05/20/2020 19:13   NM HEPATO BILIARY LEAK  Result Date: 05/21/2020 CLINICAL DATA:  Status post cholecystectomy with posterior gallbladder wall left in situ, persistent bile leak status post clogged stent removal with metallic stent placement on 05/13/2020 EXAM: NUCLEAR MEDICINE HEPATOBILIARY IMAGING TECHNIQUE: Sequential images of the abdomen were obtained out to 60 minutes following intravenous administration of radiopharmaceutical. RADIOPHARMACEUTICALS:  5.1 mCi Tc-966mCholetec IV COMPARISON:  None. FINDINGS: Prompt uptake and biliary excretion of activity by the liver is seen. Biliary activity passes into small bowel, consistent with patent common bile duct. No evidence of mild leak. IMPRESSION: No evidence of bile leak. Electronically Signed   By: SrHenderson Newcomer.  On: 05/21/2020 11:56     Scheduled Meds: . bisacodyl  10 mg Rectal Once  . brexpiprazole  2 mg Oral QHS  . ezetimibe  10 mg Oral Daily  . fluconazole  150 mg Oral QODAY  . insulin aspart  0-9 Units Subcutaneous Q4H  . QUEtiapine  800 mg Oral QHS  . saccharomyces boulardii  250 mg Oral BID  . simvastatin  10 mg Oral QAC breakfast  . topiramate  200 mg Oral BID  .  vortioxetine HBr  20 mg Oral QHS  . ziprasidone  20 mg Oral QHS   Continuous Infusions: . lactated ringers Stopped (05/22/20 0105)  . piperacillin-tazobactam (ZOSYN)  IV 12.5 mL/hr at 05/22/20 0433     LOS: 1 day    Time spent: [place], MD [facility] To contact the attending provider between 7A-7P or the covering provider during after hours 7P-7A, please log into the web site [email or web address] and access using universal [place] password for that web site. If you do not have the password, please call the hospital operator.  05/22/2020, 7:36 AM

## 2020-05-22 NOTE — Progress Notes (Signed)
Initial Nutrition Assessment  DOCUMENTATION CODES:   Non-severe (moderate) malnutrition in context of chronic illness  INTERVENTION:   -Ensure Enlive po BID, each supplement provides 350 kcal and 20 grams of protein  NUTRITION DIAGNOSIS:   Moderate Malnutrition related to chronic illness,poor appetite,constipation (cirrhosis) as evidenced by percent weight loss,mild fat depletion,moderate muscle depletion.  GOAL:   Patient will meet greater than or equal to 90% of their needs  MONITOR:   PO intake,Supplement acceptance,Labs,Weight trends,I & O's  REASON FOR ASSESSMENT:   Malnutrition Screening Tool    ASSESSMENT:   54 y.o. female with medical history significant for liver cirrhosis, insulin-dependent type 2 diabetes, hypertension, hyperlipidemia, anemia of chronic disease, depression/anxiety who presents to the ED for evaluation of right flank and right sided abdominal pain. Found to have biloma although suspect that her abdominal pain is coming from her constipation.  Patient in room, seems very stressed and reports feeling overwhelmed by her situation. Pt having some nausea r/t constipation and inability to have a BM. Pt states she has a history of constipation, sometimes goes a month without a BM. Pt states her last BM was 12/22. Pt ate the cheese off of one piece of pizza today for lunch. Drank a whole cup of hot tea.  Encouraged pt to alternate sips between a hot beverage and a cold beverage. Pt waited until she was admitted before having her suppository. 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   Pt gets full very quickly d/t constipation so appetite and intakes have been low recently.  Per weight records, pt has lost 20 lbs since 9/24 (11% wt loss x 3 months, significant for time frame).   Medications: Dulcolax suppository, Florastor, IV Zofran  Labs reviewed:  CBGs: 88-109  NUTRITION - FOCUSED PHYSICAL EXAM:  Flowsheet Row Most Recent Value   Orbital Region No depletion  Upper Arm Region Moderate depletion  Thoracic and Lumbar Region No depletion  Buccal Region Mild depletion  Temple Region Moderate depletion  Clavicle Bone Region Mild depletion  Clavicle and Acromion Bone Region Mild depletion  Scapular Bone Region Mild depletion  Dorsal Hand Moderate depletion  Patellar Region Unable to assess  Anterior Thigh Region Unable to assess  Posterior Calf Region Unable to assess  Edema (RD Assessment) None       Diet Order:   Diet Order            Diet NPO time specified  Diet effective midnight           Diet Carb Modified Fluid consistency: Thin; Room service appropriate? Yes  Diet effective now                 EDUCATION NEEDS:   Education needs have been addressed  Skin:  Skin Assessment: Reviewed RN Assessment  Last BM:  12/22  Height:   Ht Readings from Last 1 Encounters:  05/21/20 5' 4"  (1.626 m)    Weight:   Wt Readings from Last 1 Encounters:  05/21/20 72.4 kg    BMI:  Body mass index is 27.4 kg/m.  Estimated Nutritional Needs:   Kcal:  1800-2000  Protein:  85-100g  Fluid:  2L/day  Clayton Bibles, MS, RD, LDN Inpatient Clinical Dietitian Contact information available via Amion

## 2020-05-22 NOTE — Progress Notes (Addendum)
Progress Note  Chief Complaint:    Right sided abdominal pain      ASSESSMENT / PLAN:    # 54 yo female with HTN, DM2, chronic constipation, cirrhosis ( probably NASH), cholelithiasis s/p cholecystectomy March 6063 complicated by persistent cystic duct leak.  Repeat ERCP on 12/20 again showed persistent cystic duct leak as well as occluded plastic biliary stent. Stent exchanged for a fully covered metal biliary stent.  Patient now admitted with severe RUQ pain, lipase of 101. CT scan shows cirrhosis with portal HTN,  multilocular fluid collection 4.6 x 2.6 cm in gallbladder bed. No radiographic evidence for pancreatitis.  --Etiology of pain likely secondary to expansion of biloma post ERCP. IR feels fluid collection is small and deep and drainage would require a transhepatic drain. HIDA scan yesterday negative for ongoing bile leak.  She is still having a lot of RUQ pain. Despite Oxycodone and Morphine.  Continue supportive care for now.  --WBC normal. Afebrile --On Zosyn.   --On clear liquids. This can be advanced as tolerated though with nausea she may not feel like eating.   # Cirrhosis with portal HTN ( splenomegaly) on imaging. Appears compensated.  --INR 1.6, platelets 221. No ascites on imaging.  --Normal AFP in March 2021. No mention of lesion lesions on CT scan w/ contrast yesterday --Varices screening can be addressed as outpatient  # Constipation.  --Hasn't taken the suppository yet, wanted to wait until she got into room from the ED. She plans to use the suppository today.   # Colon cancer screening  --Eventual colonoscopy with 2 day bowel prep. Prep was poor on last colonoscopy in Jan 2021.       SUBJECTIVE:    Pain in RUQ / right flank has not improved since I saw her yesterday. Some associated nausea.   OBJECTIVE:    Scheduled inpatient medications:  . bisacodyl  10 mg Rectal Once  . brexpiprazole  2 mg Oral QHS  . ezetimibe  10 mg Oral Daily  .  fluconazole  150 mg Oral QODAY  . insulin aspart  0-9 Units Subcutaneous TID WC  . QUEtiapine  800 mg Oral QHS  . saccharomyces boulardii  250 mg Oral BID  . simvastatin  10 mg Oral QAC breakfast  . topiramate  200 mg Oral BID  . vortioxetine HBr  20 mg Oral QHS  . ziprasidone  20 mg Oral QHS   Continuous inpatient infusions:  . lactated ringers Stopped (05/22/20 0105)  . piperacillin-tazobactam     PRN inpatient medications: acetaminophen **OR** acetaminophen, albuterol, ketorolac, morphine injection, ondansetron **OR** ondansetron (ZOFRAN) IV, oxyCODONE  Vital signs in last 24 hours: Temp:  [97.4 F (36.3 C)-98.3 F (36.8 C)] 97.4 F (36.3 C) (12/29 0540) Pulse Rate:  [84-90] 87 (12/29 0540) Resp:  [15-20] 16 (12/29 0540) BP: (111-124)/(62-78) 119/73 (12/29 0540) SpO2:  [93 %-97 %] 95 % (12/29 0540) Weight:  [72.4 kg] 72.4 kg (12/28 1815) Last BM Date: 05/15/20  Intake/Output Summary (Last 24 hours) at 05/22/2020 1107 Last data filed at 05/22/2020 1021 Gross per 24 hour  Intake 1228.72 ml  Output 1400 ml  Net -171.28 ml     Physical Exam:  . General: Alert female in NAD . Heart:  Regular rate and rhythm. No lower extremity edema . Pulmonary: Normal respiratory effort . Abdomen: Soft, nondistended, nontender. Normal bowel sounds.  . Neurologic: Alert and oriented . Psych: Pleasant. Cooperative.   Filed Weights   05/21/20 1815  Weight: 72.4 kg    Intake/Output from previous day: 12/28 0701 - 12/29 0700 In: 1228.7 [I.V.:1135.8; IV Piggyback:93] Out: 800 [Urine:800] Intake/Output this shift: Total I/O In: -  Out: 600 [Urine:600]    Lab Results: Recent Labs    05/20/20 1152 05/21/20 0506 05/22/20 0537  WBC 6.2 6.2 6.2  HGB 9.6* 9.6* 9.3*  HCT 30.2* 31.7* 31.1*  PLT 222.0 221 224   BMET Recent Labs    05/20/20 1152 05/21/20 0506 05/22/20 0537  NA 138 139 139  K 4.3 4.1 4.2  CL 106 108 107  CO2 22 21* 23  GLUCOSE 183* 105* 105*  BUN 25* 18  22*  CREATININE 0.61 0.47 0.58  CALCIUM 9.2 8.9 8.7*   LFT Recent Labs    05/20/20 1152 05/21/20 0506 05/22/20 0537  PROT 7.6   < > 6.9  ALBUMIN 3.7   < > 3.2*  AST 44*   < > 53*  ALT 41*   < > 38  ALKPHOS 244*   < > 216*  BILITOT 0.3   < > 0.6  BILIDIR 0.1  --   --    < > = values in this interval not displayed.   PT/INR Recent Labs    05/20/20 2100  LABPROT 18.0*  INR 1.6*   Hepatitis Panel No results for input(s): HEPBSAG, HCVAB, HEPAIGM, HEPBIGM in the last 72 hours.  CT ABDOMEN PELVIS W CONTRAST  Result Date: 05/20/2020 CLINICAL DATA:  Recent ERCP with stent placement, prior cholecystectomy EXAM: CT ABDOMEN AND PELVIS WITH CONTRAST TECHNIQUE: Multidetector CT imaging of the abdomen and pelvis was performed using the standard protocol following bolus administration of intravenous contrast. CONTRAST:  143m OMNIPAQUE IOHEXOL 300 MG/ML  SOLN COMPARISON:  March 12, 2020 FINDINGS: Lower chest: The visualized heart size within normal limits. No pericardial fluid/thickening. No hiatal hernia. The visualized portions of the lungs are clear. Hepatobiliary: There is a slightly nodular liver contour which is diffusely low signal throughout. The portal vein appears to be patent. The patient is status post common bile duct stent placement. Within the area of the gallbladder bed there is a new multilocular fluid collection measuring approximately 4.6 x 2.6 cm. The adjacent cholecystectomy surgical clips are noted. A small amount of pneumobilia is noted. There appears to be mild fat stranding changes seen adjacent to the gallbladder bed at the level of the proximal bile duct stent. No free air is noted. Pancreas: Unremarkable. No pancreatic ductal dilatation or surrounding inflammatory changes. Spleen: The spleen is mildly enlarged measuring 15 cm in craniocaudad dimension. Adrenals/Urinary Tract: Both adrenal glands appear normal. The kidneys and collecting system appear normal without  evidence of urinary tract calculus or hydronephrosis. Bladder is partially decompressed. Stomach/Bowel: The stomach, small bowel, and colon are normal in appearance. No inflammatory changes, wall thickening, or obstructive findings.A moderate to large amount of colonic stool is present throughout. The appendix is unremarkable. Vascular/Lymphatic: There are no enlarged mesenteric, retroperitoneal, or pelvic lymph nodes. No significant vascular findings are present. Reproductive: The patient is status post hysterectomy. No adnexal masses or collections seen. Other: No evidence of abdominal wall mass or hernia. Musculoskeletal: No acute or significant osseous findings. IMPRESSION: Multilocular fluid collection within the gallbladder bed measuring 4.6 x 2.6 cm which could be due to biloma, or postprocedural seroma. Mild inflammatory changes seen within the porta hepatis No definite evidence of pancreatitis Findings suggestive of cirrhosis and portal hypertension Electronically Signed   By: BEbony CargoD.  On: 05/20/2020 19:13   NM HEPATO BILIARY LEAK  Result Date: 05/21/2020 CLINICAL DATA:  Status post cholecystectomy with posterior gallbladder wall left in situ, persistent bile leak status post clogged stent removal with metallic stent placement on 05/13/2020 EXAM: NUCLEAR MEDICINE HEPATOBILIARY IMAGING TECHNIQUE: Sequential images of the abdomen were obtained out to 60 minutes following intravenous administration of radiopharmaceutical. RADIOPHARMACEUTICALS:  5.1 mCi Tc-77m Choletec IV COMPARISON:  None. FINDINGS: Prompt uptake and biliary excretion of activity by the liver is seen. Biliary activity passes into small bowel, consistent with patent common bile duct. No evidence of mild leak. IMPRESSION: No evidence of bile leak. Electronically Signed   By: SJulian HyM.D.   On: 05/21/2020 11:56      Principal Problem:   Biloma Active Problems:   Insulin dependent type 2 diabetes mellitus ([place]    Hyperlipidemia associated with type 2 diabetes mellitus ([place]   Hypertension associated with diabetes ([place]   Other cirrhosis of liver ([place]   Bile leak   Abdominal pain     LOS: 1 day   PTye Savoy,NP 05/22/2020, 11:07 AM   Attending physician's note   I have taken an interval history, reviewed the chart and examined the patient. I agree with the Advanced Practitioner's note, impression and recommendations.   Severe persistent 10/10 R flank pain with tenderness highly likely d/t biloma (4.6 cm) seen on CT 2 days ago.  HIDA scan does reveal patent SEMS.  Pain despite morphine Q2hrs, Toradol Q6 and oxycodone Q6.  Plan: -Would continue IV Zosyn. -Will get in touch with Dr. YKathlene Coteregarding aspiration/drainage. -If unable to, or problems then would proceed with MRI/MRCP.  Not sure if it can be drained endoscopically.  Surgery as a last resort.    RCarmell Austria MD LVelora HecklerGI [phone]

## 2020-05-23 ENCOUNTER — Inpatient Hospital Stay (HOSPITAL_COMMUNITY): Payer: Medicare Other

## 2020-05-23 ENCOUNTER — Telehealth: Payer: Self-pay | Admitting: Internal Medicine

## 2020-05-23 DIAGNOSIS — E440 Moderate protein-calorie malnutrition: Secondary | ICD-10-CM | POA: Insufficient documentation

## 2020-05-23 DIAGNOSIS — K668 Other specified disorders of peritoneum: Secondary | ICD-10-CM | POA: Diagnosis not present

## 2020-05-23 DIAGNOSIS — K839 Disease of biliary tract, unspecified: Secondary | ICD-10-CM | POA: Diagnosis not present

## 2020-05-23 LAB — CBC WITH DIFFERENTIAL/PLATELET
Abs Immature Granulocytes: 0.1 10*3/uL — ABNORMAL HIGH (ref 0.00–0.07)
Basophils Absolute: 0 10*3/uL (ref 0.0–0.1)
Basophils Relative: 0 %
Eosinophils Absolute: 0.3 10*3/uL (ref 0.0–0.5)
Eosinophils Relative: 5 %
HCT: 34.6 % — ABNORMAL LOW (ref 36.0–46.0)
Hemoglobin: 10.1 g/dL — ABNORMAL LOW (ref 12.0–15.0)
Immature Granulocytes: 2 %
Lymphocytes Relative: 27 %
Lymphs Abs: 1.6 10*3/uL (ref 0.7–4.0)
MCH: 25.8 pg — ABNORMAL LOW (ref 26.0–34.0)
MCHC: 29.2 g/dL — ABNORMAL LOW (ref 30.0–36.0)
MCV: 88.5 fL (ref 80.0–100.0)
Monocytes Absolute: 0.4 10*3/uL (ref 0.1–1.0)
Monocytes Relative: 7 %
Neutro Abs: 3.5 10*3/uL (ref 1.7–7.7)
Neutrophils Relative %: 59 %
Platelets: 222 10*3/uL (ref 150–400)
RBC: 3.91 MIL/uL (ref 3.87–5.11)
RDW: 14.9 % (ref 11.5–15.5)
WBC: 6 10*3/uL (ref 4.0–10.5)
nRBC: 0 % (ref 0.0–0.2)

## 2020-05-23 LAB — URINE CULTURE: Culture: >=100000 — AB

## 2020-05-23 LAB — GLUCOSE, CAPILLARY
Glucose-Capillary: 130 mg/dL — ABNORMAL HIGH (ref 70–99)
Glucose-Capillary: 104 mg/dL — ABNORMAL HIGH (ref 70–99)
Glucose-Capillary: 131 mg/dL — ABNORMAL HIGH (ref 70–99)
Glucose-Capillary: 94 mg/dL (ref 70–99)

## 2020-05-23 LAB — COMPREHENSIVE METABOLIC PANEL
ALT: 37 U/L (ref 0–44)
AST: 50 U/L — ABNORMAL HIGH (ref 15–41)
Albumin: 3.4 g/dL — ABNORMAL LOW (ref 3.5–5.0)
Alkaline Phosphatase: 215 U/L — ABNORMAL HIGH (ref 38–126)
Anion gap: 12 (ref 5–15)
BUN: 20 mg/dL (ref 6–20)
CO2: 21 mmol/L — ABNORMAL LOW (ref 22–32)
Calcium: 8.9 mg/dL (ref 8.9–10.3)
Chloride: 106 mmol/L (ref 98–111)
Creatinine, Ser: 0.72 mg/dL (ref 0.44–1.00)
GFR, Estimated: >60 mL/min (ref >60)
Glucose, Bld: 142 mg/dL — ABNORMAL HIGH (ref 70–99)
Potassium: 4.2 mmol/L (ref 3.5–5.1)
Sodium: 139 mmol/L (ref 135–145)
Total Bilirubin: 0.5 mg/dL (ref 0.3–1.2)
Total Protein: 7 g/dL (ref 6.5–8.1)

## 2020-05-23 LAB — LIPASE, BLOOD: Lipase: 67 U/L — ABNORMAL HIGH (ref 11–51)

## 2020-05-23 MED ORDER — FENTANYL CITRATE (PF) 100 MCG/2ML IJ SOLN
INTRAMUSCULAR | Status: AC | PRN
Start: 2020-05-23 — End: 2020-05-23
  Administered 2020-05-23 (×2): 50 ug via INTRAVENOUS

## 2020-05-23 MED ORDER — SODIUM CHLORIDE 0.9 % IV SOLN: INTRAVENOUS | Status: DC

## 2020-05-23 MED ORDER — LIDOCAINE HCL 1 % IJ SOLN
INTRAMUSCULAR | Status: AC
  Filled 2020-05-23: qty 20

## 2020-05-23 MED ORDER — FENTANYL CITRATE (PF) 100 MCG/2ML IJ SOLN
INTRAMUSCULAR | Status: AC
  Filled 2020-05-23: qty 2

## 2020-05-23 MED ORDER — MIDAZOLAM HCL 2 MG/2ML IJ SOLN
INTRAMUSCULAR | Status: AC | PRN
  Administered 2020-05-23 (×3): 1 mg via INTRAVENOUS

## 2020-05-23 MED ORDER — LIDOCAINE HCL (PF) 1 % IJ SOLN
INTRAMUSCULAR | Status: AC | PRN
  Administered 2020-05-23: 10 mL

## 2020-05-23 MED ORDER — MIDAZOLAM HCL 2 MG/2ML IJ SOLN
INTRAMUSCULAR | Status: AC
  Filled 2020-05-23: qty 4

## 2020-05-23 NOTE — Procedures (Signed)
Interventional Radiology Procedure Note  Procedure: CT Guided Drainage of gallbladder fossa collection  Complications: None  Estimated Blood Loss: < 10 mL  Findings: CT now demonstrates almost entirely focal air in GB fossa that appears to communicate with air in biliary tree and consistent with fistula to GB fossa. Minimal fluid. Despite minimal fluid, decision made to place drain to try to heal biliary fistula.  10 Fr drain placed in gallbladder fossa with return of small amount of bloody fluid. Fluid sample sent for culture analysis. Drain attached to suction bulb drainage.  Will follow.  Venetia Night. Kathlene Cote, M.D Pager:  [phone]

## 2020-05-23 NOTE — Progress Notes (Addendum)
[facility] Progress Note  CC:  right sided abdominal/flank pain   Subjective: She continues to rate her RUQ and right flank pain 10 out of 10. She describes pain as a tight squeezing pressure. She is receiving Morphine 23m IV Q 2 to 4 hrs as needed, Oxycodone 159mpo Q 6 hrs PRN and Toradol 3067m 6hrs PRN. She is having some nausea, somewhat relieved by taking Zofran. No BM since admission. She is passing gas per the rectum.   Objective:  Vital signs in last 24 hours: Temp:  [98.1 F (36.7 C)-98.5 F (36.9 C)] 98.1 F (36.7 C) (12/29 2030) Pulse Rate:  [80-83] 80 (12/29 2030) Resp:  [15-16] 16 (12/29 2030) BP: (130-138)/(69-71) 138/69 (12/29 2030) SpO2:  [94 %-96 %] 94 % (12/29 2030) Last BM Date: 05/15/20 General:   Alert [age]ar old female in NAD. She became tearful when questioned about her pain level.  Heart: RRR, no murmur.  Pulm: Breath sounds clear throughout.  Abdomen: Large, soft, moderate tenderness RUQ, RLQ and right flank area, less tenderness to the LUQ and LLQ. No rebound or guarding.  Extremities:  Without edema. Neurologic:  Alert and  oriented x4;  grossly normal neurologically. Psych:  Alert and cooperative. Normal mood and affect.  Intake/Output from previous day: 12/29 0701 - 12/30 0700 In: 480 [P.O.:480] Out: 2400 [Urine:2400] Intake/Output this shift: No intake/output data recorded.  Lab Results: Recent Labs    05/21/20 0506 05/22/20 0537 05/23/20 0546  WBC 6.2 6.2 6.0  HGB 9.6* 9.3* 10.1*  HCT 31.7* 31.1* 34.6*  PLT 221 224 222   BMET Recent Labs    05/21/20 0506 05/22/20 0537 05/23/20 0546  NA 139 139 139  K 4.1 4.2 4.2  CL 108 107 106  CO2 21* 23 21*  GLUCOSE 105* 105* 142*  BUN 18 22* 20  CREATININE 0.47 0.58 0.72  CALCIUM 8.9 8.7* 8.9   LFT Recent Labs    05/20/20 1152 05/21/20 0506 05/23/20 0546  PROT 7.6   < > 7.0  ALBUMIN 3.7   < > 3.4*  AST 44*   < > 50*  ALT 41*   < > 37  ALKPHOS 244*   < > 215*   BILITOT 0.3   < > 0.5  BILIDIR 0.1  --   --    < > = values in this interval not displayed.   PT/INR Recent Labs    05/20/20 2100  LABPROT 18.0*  INR 1.6*    NM HEPATO BILIARY LEAK  Result Date: 05/21/2020 CLINICAL DATA:  Status post cholecystectomy with posterior gallbladder wall left in situ, persistent bile leak status post clogged stent removal with metallic stent placement on 05/13/2020 EXAM: NUCLEAR MEDICINE HEPATOBILIARY IMAGING TECHNIQUE: Sequential images of the abdomen were obtained out to 60 minutes following intravenous administration of radiopharmaceutical. RADIOPHARMACEUTICALS:  5.1 mCi Tc-48m26moletec IV COMPARISON:  None. FINDINGS: Prompt uptake and biliary excretion of activity by the liver is seen. Biliary activity passes into small bowel, consistent with patent common bile duct. No evidence of mild leak. IMPRESSION: No evidence of bile leak. Electronically Signed   By: SriyJulian Hy.   On: 05/21/2020 11:56    Assessment / Plan:  1. 89 y81r old female s/p cholecystectomy March 20211694plicated by persistent cystic duct leak. Repeat ERCP on 12/20 again showed persistent cystic duct leakas well as occluded plastic biliary stent. Stent exchanged for a fully covered metalbiliarystent. Patient now admitted withsevereRUQ  pain, lipase of 101 -> 67. CT scan shows cirrhosis with portal HTN, multilocular fluid collection 4.6 x 2.6 cm in gallbladder bed likely biloma. No radiographicevidence for pancreatitis. HIDA 12/28 without evidence of a bile leak. Today Alk phos 215. AST 50. ALT 37. T. Bili 0.5. She continues to have significant right abdominal and flank pain. She is afebrile. Hemodynamically stable.  -Continue Zosyn IV -NPO for possible drain by IR today -Await further recommendations from Dr. Kathlene Cote regarding aspiration/drainage of 4.6 x 2.6cm fluid collection (likely biloma) -Pain management per the hospitalist   2. Cirrhosis (suspected NASH) with portal  HTN (splenomegaly) on CT imaging. PLT 222.   3. Colon cancer screening  -Eventual colonoscopy with 2 day bowel prep. Prep was poor on last colonoscopy in Jan 2021  4. UTI + Klebsiella Pneumoniae   5. Chronic normocytic anemia. Hg 9.3 -> 10.1. No signs of active GI bleeding.   Further recommendations per Dr. Carlean Purl    LOS: 2 days   ALOISE COPUS  05/23/2020, 09:11AM   I have seen and evaluated the patient as well.  Hopefully IR will be able to successfully drain her fluid collection and that will provide relief of her pain.  We will follow up tomorrow.  Gatha Mayer, MD, [facility] 05/23/2020 11:58 AM

## 2020-05-23 NOTE — Progress Notes (Signed)
PROGRESS NOTE   Kim Hicks  DGU:[id number] DOB: [birth year] DOA: 05/20/2020 PCP: [name], FNP  Brief Narrative:  54 year old white female from home HTN, HLD, depression with psychosis NIDDM complicated by diabetic neuropathy severe cirrhosis of liver Lap chole 08/04/2019-difficult case-backwall of gallbladder left in place-found to have a contained bile leak status post ERCP sphincterotomy and stent 08/07/2019 followed by [facility]-was discharged on Augmentin  Subsequently 11/07/2019 underwent ERCP stent was exchanged in common bile duct with a new stent upsized 10 French plastic stent  In the interim was admitted at [facility] 03/11/2020 found to have E. coli in urine blood cultures 1 out of 4 bottles E. coli was treated with Levaquin Found to have RIb # 8th rib  Underwent a final repeat ERCP 12/20 with persistent bile leak clogged stent removed metal stent placed Developed on 12/21 progressive worsening R LQ pain diaphoresis chills-seen at GI clinic 12/27 and she was sent to [facility] long ED for imaging and further work-up  CT abdomen pelvis = multilocular fluid collection 4.6 X2.6 biloma/postprocedural seroma-GI consulted Zosyn started IR consulted   Assessment & Plan:   Principal Problem:   Biloma Active Problems:   Insulin dependent type 2 diabetes mellitus ([place])   Hyperlipidemia associated with type 2 diabetes mellitus ([place])   Hypertension associated with diabetes ([place])   Other cirrhosis of liver ([place])   Bile leak   Abdominal pain   Malnutrition of moderate degree  Biloma versus postop seroma status post lap cholecystectomy Continue OxyIR 10 every 6 as needed, morphine 2 to 4 mg every 2 as needed in addition to Toradol 30 every 6 as needed for moderate pain--at baseline does not take medications for pain other than for arthritis Appreciate coordination by IR/GI to determine next steps and possible drain placement Hopefully this will give some  benefit Insulin-dependent DM TY 2 with severe neuropathy Currently n.p.o. for procedure CBGs 104-130 Continue current sliding scale management at this time Holding Basaglar 38 Metformin 1000 at bedtime and Synjardy for now Cirrhosis Secondary to NAFLD?-Outpatient follow-up with GI HTN  continue lisinopril 20, prazosin 4 Outpatient further adjustment Anemia chronic disease  Currently stable Depression anxiety  Continue Topamax 200 twice daily, vortioxetine 20 Seroquel iii/day Rexulti 2 at bedtime  DVT prophylaxis: SCD Code Status: Full Family Communication: None Disposition:  Status is: Inpatient  Remains inpatient appropriate because:Hemodynamically unstable and Ongoing active pain requiring inpatient pain management   Dispo: The patient is from: Home              Anticipated d/c is to: Home              Anticipated d/c date is: 3 days              Patient currently is not medically stable to d/c.       Consultants:   GI  IR  Procedures: None  Antimicrobials: None   Subjective: Passing gas No vomit Pain moderately controlled N.p.o. and ready for procedure No chest pain no fever Objective: Vitals:   05/21/20 2008 05/22/20 0540 05/22/20 1348 05/22/20 2030  BP: 118/70 119/73 130/71 138/69  Pulse: 84 87 83 80  Resp: 16 16 15 16   Temp: 98.3 F (36.8 C) (!) 97.4 F (36.3 C) 98.5 F (36.9 C) 98.1 F (36.7 C)  TempSrc: Oral Oral Oral Oral  SpO2: 93% 95% 96% 94%  Weight:      Height:        Intake/Output Summary (Last 24 hours)  at 05/23/2020 1223 Last data filed at 05/23/2020 1131 Gross per 24 hour  Intake 240 ml  Output 2400 ml  Net -2160 ml   Filed Weights   05/21/20 1815  Weight: 72.4 kg    Examination:  EOMI NCAT  S1-S2 no murmur Abdomen slightly tender in epigastrium ROM intact  Data Reviewed: I have personally reviewed following labs and imaging studies Sodium 139 potassium 4.2 BUNs/creatinine 25/0.6---->20/0.7 Alk phos  244-->216-->215 AST/ALT = 44/41-->53/38-->50/37  White count 6.0 hemoglobin 10.1 platelet 222   Radiology Studies: No results found.   Scheduled Meds: . bisacodyl  10 mg Rectal Once  . brexpiprazole  2 mg Oral QHS  . ezetimibe  10 mg Oral Daily  . feeding supplement  237 mL Oral BID BM  . fluconazole  150 mg Oral QODAY  . insulin aspart  0-9 Units Subcutaneous TID WC  . QUEtiapine  800 mg Oral QHS  . saccharomyces boulardii  250 mg Oral BID  . simvastatin  10 mg Oral QAC breakfast  . topiramate  200 mg Oral BID  . vortioxetine HBr  20 mg Oral QHS  . ziprasidone  20 mg Oral QHS   Continuous Infusions: . lactated ringers 75 mL/hr at 05/23/20 1057  . piperacillin-tazobactam 3.375 g (05/23/20 0951)     LOS: 2 days    Time spent: 20  Nita Sells, MD [facility] To contact the attending provider between 7A-7P or the covering provider during after hours 7P-7A, please log into the web site [email or web address] and access using universal [place] password for that web site. If you do not have the password, please call the hospital operator.  05/23/2020, 12:23 PM

## 2020-05-23 NOTE — Consult Note (Signed)
Chief Complaint: Patient was seen in consultation today for  Chief Complaint  Patient presents with  . Flank Pain  . Abnormal Lab    Referring Physician(s): Dr. Lyndel Safe  Supervising Physician: Aletta Edouard  Patient Status: [facility] - In-pt  History of Present Illness: Kim Hicks is a 54 y.o. female with a medical history significant for anxiety/depression, DM, HTN and cirrhosis. She had a cholecystectomy March 2021 and developed a post-op bile leak. An 8.5 Fr biliary stent was placed a few days after surgery but the bile leak persisted so a 10 Fr. stent was placed. She presented to the hospital 05/13/20 for an outpatient follow up ERCP with possible biliary stent removal. During the procedure they discovered the stent to be clogged and it was exchanged for a metal stent.   On 05/20/20, she presented to the [facility] ED with complaints of right flank pain and nausea that started not long after her procedure. CT imaging obtained showed a multilocular fluid collection within the gallbladder bed measuring 4.6 x 2.6 cm which could be due to biloma or postprocedural seroma.  No definite evidence of pancreatitis was seen.  Findings suggestive of cirrhosis and portal hypertension also noted.  IR was consulted at that time for a possible aspiration of this fluid collection. IR deemed the fluid collection to be too small and difficult to access percutaneously. She had a HIDA scan 05/21/20 which was negative for ongoing bile leak. The patient's pain has continued to be severe and persistent and IR was re-consulted for possible aspiration/drain placement. This case has been reviewed and procedure approved by Dr. Kathlene Cote.   Past Medical History:  Diagnosis Date  . Anxiety   . Cataract   . Cirrhosis of liver ([place])   . Depression   . Diabetes mellitus without complication ([place])   . Dysrhythmia   . Edema   . Family history of adverse reaction to anesthesia    mother had trouble waking up after surgery   . Foot fracture, left   . Headache    MIGRAINES  . Hyperlipidemia   . Hypertension   . Neuromuscular disorder ([place])    NEUROPATHY-both hands and feet  . Palpitations   . Wheezing     Past Surgical History:  Procedure Laterality Date  . ABDOMINAL HYSTERECTOMY     one ovary left  . BILIARY STENT PLACEMENT N/A 08/07/2019   Procedure: BILIARY STENT PLACEMENT;  Surgeon: [name], MD;  Location: [facility];  Service: Endoscopy;  Laterality: N/A;  . BILIARY STENT PLACEMENT N/A 11/07/2019   Procedure: BILIARY STENT PLACEMENT;  Surgeon: [name], MD;  Location: [facility];  Service: Endoscopy;  Laterality: N/A;  . BILIARY STENT PLACEMENT N/A 05/13/2020   Procedure: BILIARY STENT PLACEMENT;  Surgeon: [name], MD;  Location: [facility];  Service: Endoscopy;  Laterality: N/A;  . CATARACT EXTRACTION W/PHACO Right 01/11/2018   Procedure: CATARACT EXTRACTION PHACO AND INTRAOCULAR LENS PLACEMENT (IOC);  Surgeon: [name], MD;  Location: [facility] ORS;  Service: Ophthalmology;  Laterality: Right;  [place] 00:57.5 AP% 15.0 CDE 8.61 Fluid Pack Lot # [id number]  . CATARACT EXTRACTION W/PHACO Left 02/15/2018   Procedure: CATARACT EXTRACTION PHACO AND INTRAOCULAR LENS PLACEMENT (IOC);  Surgeon: [name], MD;  Location: [facility] ORS;  Service: Ophthalmology;  Laterality: Left;  [place] 00:51 AP% 13.2 CDE 6.82 Fluid p ack lot # [id number]  . CHOLECYSTECTOMY N/A 08/04/2019   Procedure: LAPAROSCOPIC CHOLECYSTECTOMY;  Surgeon: [name], MD;  Location: [facility];  Service: General;  Laterality: N/A;  . COLONOSCOPY    . ENDOSCOPIC RETROGRADE CHOLANGIOPANCREATOGRAPHY (ERCP) WITH PROPOFOL N/A 11/07/2019   Procedure: ENDOSCOPIC RETROGRADE CHOLANGIOPANCREATOGRAPHY (ERCP) WITH PROPOFOL;  Surgeon: [name], MD;  Location: [facility];  Service: Endoscopy;  Laterality: N/A;  . ENDOSCOPIC RETROGRADE CHOLANGIOPANCREATOGRAPHY (ERCP) WITH PROPOFOL N/A 05/13/2020   Procedure:  ENDOSCOPIC RETROGRADE CHOLANGIOPANCREATOGRAPHY (ERCP) WITH PROPOFOL;  Surgeon: [name], MD;  Location: [facility];  Service: Endoscopy;  Laterality: N/A;  . ERCP N/A 08/07/2019   Procedure: ENDOSCOPIC RETROGRADE CHOLANGIOPANCREATOGRAPHY (ERCP);  Surgeon: [name], MD;  Location: [facility];  Service: Endoscopy;  Laterality: N/A;  . RCR Bilateral   . REMOVAL OF STONES  08/07/2019   Procedure: REMOVAL OF STONES;  Surgeon: [name], MD;  Location: [facility];  Service: Endoscopy;;  balloon sweep, no stones  . SPHINCTEROTOMY  08/07/2019   Procedure: SPHINCTEROTOMY;  Surgeon: [name], MD;  Location: [facility];  Service: Endoscopy;;  . STENT REMOVAL  11/07/2019   Procedure: STENT REMOVAL;  Surgeon: [name], MD;  Location: [facility];  Service: Endoscopy;;  . Lavell Islam REMOVAL  05/13/2020   Procedure: STENT REMOVAL;  Surgeon: [name], MD;  Location: [facility];  Service: Endoscopy;;    Allergies: Talwin [pentazocine]  Medications: Prior to Admission medications   Medication Sig Start Date End Date Taking? Authorizing Provider  albuterol (VENTOLIN HFA) 108 (90 Base) MCG/ACT inhaler Inhale 2 puffs into the lungs every 6 (six) hours as needed for shortness of breath or wheezing. 02/27/20  Yes [provider]  brexpiprazole (REXULTI) 2 MG TABS tablet Take 2 mg by mouth at bedtime.   Yes [provider]  calcium carbonate (OSCAL) 1500 (600 Ca) MG TABS tablet Take 600 mg of elemental calcium by mouth daily with breakfast.   Yes [provider]  Empagliflozin-metFORMIN HCl ER (SYNJARDY XR) 25-1000 MG TB24 Take 1 tablet by mouth daily before breakfast.  11/15/18  Yes [provider]  ezetimibe (ZETIA) 10 MG tablet Take 10 mg by mouth daily. 04/25/20  Yes [provider]  ferrous sulfate 325 (65 FE) MG tablet Take 325 mg by mouth daily with breakfast.   Yes [provider]  Insulin Glargine (BASAGLAR  KWIKPEN) 100 UNIT/ML Inject 38 Units into the skin at bedtime. 03/20/20  Yes [provider]  LINZESS 290 MCG CAPS capsule Take 290 mcg by mouth every morning. 08/16/19  Yes [provider]  lisinopril (ZESTRIL) 20 MG tablet TAKE 1 TABLET BY MOUTH EVERY DAY Patient taking differently: Take 20 mg by mouth daily. 02/08/20  Yes [name], MD  metFORMIN (GLUCOPHAGE) 1000 MG tablet Take 1,000 mg by mouth at bedtime. 02/27/20  Yes [provider]  Omega-3 Fatty Acids (OMEGA 3 PO) Take 500 mg by mouth daily.    Yes [provider]  prazosin (MINIPRESS) 2 MG capsule Take 4 mg by mouth at bedtime.   Yes [provider]  pregabalin (LYRICA) 100 MG capsule TAKE 1 CAPSULE BY MOUTH THREE TIMES A DAY Patient taking differently: Take 100 mg by mouth 3 (three) times daily. 02/12/20  Yes [name], NP  QUEtiapine (SEROQUEL) 400 MG tablet Take 800 mg by mouth at bedtime.   Yes [provider]  simvastatin (ZOCOR) 10 MG tablet Take 10 mg by mouth daily before breakfast.  12/13/17  Yes [provider]  topiramate (TOPAMAX) 200 MG tablet Take 200 mg by mouth 2 (two) times  daily. 04/12/20  Yes [provider]  traMADol ([name]) 50 MG tablet Take 50 mg by mouth daily as needed for moderate pain.  01/13/19  Yes [provider]  vortioxetine HBr ([name]) 20 MG TABS tablet Take 20 mg by mouth at bedtime.   Yes [provider]  ziprasidone ([name]) 20 MG capsule Take 20 mg by mouth at bedtime.   Yes [provider]  Dulaglutide 4.5 MG/0.5ML SOPN Inject 4 mg into the skin once a week. Monday 12/20/18   [provider]     Family History  Problem Relation Age of Onset  . Diabetes Mother   . Heart failure Mother   . Depression Mother   . Colon cancer Neg Hx   . Stomach cancer Neg Hx   . Esophageal cancer Neg Hx   . Pancreatic cancer Neg Hx     Social History   Socioeconomic History  . Marital  status: Married    Spouse name: Not on file  . Number of children: Not on file  . Years of education: Not on file  . Highest education level: Not on file  Occupational History  . Not on file  Tobacco Use  . Smoking status: Never Smoker  . Smokeless tobacco: Never Used  Vaping Use  . Vaping Use: Never used  Substance and Sexual Activity  . Alcohol use: Never  . Drug use: Never  . Sexual activity: Not on file  Other Topics Concern  . Not on file  Social History Narrative  . Not on file   Social Determinants of Health   Financial Resource Strain: Not on file  Food Insecurity: Not on file  Transportation Needs: Not on file  Physical Activity: Not on file  Stress: Not on file  Social Connections: Not on file    Review of Systems: A 12 point ROS discussed and pertinent positives are indicated in the HPI above.  All other systems are negative.  Review of Systems  Constitutional: Positive for appetite change and fatigue.  Respiratory: Negative for cough and shortness of breath.   Cardiovascular: Negative for chest pain and leg swelling.  Gastrointestinal: Positive for abdominal pain and nausea. Negative for diarrhea and vomiting.  Musculoskeletal: Positive for back pain.  Neurological: Negative for dizziness and headaches.  Hematological: Does not bruise/bleed easily.    Vital Signs: BP 138/69 (BP Location: Right Arm)   Pulse 80   Temp 98.1 F (36.7 C) (Oral)   Resp 16   Ht 5' 4"  (1.626 m)   Wt 159 lb 9.8 oz (72.4 kg)   SpO2 94%   BMI 27.40 kg/m   Physical Exam Constitutional:      General: She is not in acute distress. HENT:     Mouth/Throat:     Mouth: Mucous membranes are moist.     Pharynx: Oropharynx is clear.  Cardiovascular:     Rate and Rhythm: Normal rate and regular rhythm.  Pulmonary:     Effort: Pulmonary effort is normal.     Breath sounds: Normal breath sounds.  Abdominal:     General: Bowel sounds are normal.     Palpations: Abdomen is soft.      Tenderness: There is abdominal tenderness.     Comments: RUQ pain  Musculoskeletal:        General: Tenderness present.     Comments: Right mid/upper back pain  Skin:    General: Skin is warm and dry.  Neurological:     Mental  Status: She is alert and oriented to person, place, and time.  Psychiatric:        Mood and Affect: Mood normal.        Behavior: Behavior normal.        Thought Content: Thought content normal.        Judgment: Judgment normal.     Imaging: CT ABDOMEN PELVIS W CONTRAST  Result Date: 05/20/2020 CLINICAL DATA:  Recent ERCP with stent placement, prior cholecystectomy EXAM: CT ABDOMEN AND PELVIS WITH CONTRAST TECHNIQUE: Multidetector CT imaging of the abdomen and pelvis was performed using the standard protocol following bolus administration of intravenous contrast. CONTRAST:  158m OMNIPAQUE IOHEXOL 300 MG/ML  SOLN COMPARISON:  March 12, 2020 FINDINGS: Lower chest: The visualized heart size within normal limits. No pericardial fluid/thickening. No hiatal hernia. The visualized portions of the lungs are clear. Hepatobiliary: There is a slightly nodular liver contour which is diffusely low signal throughout. The portal vein appears to be patent. The patient is status post common bile duct stent placement. Within the area of the gallbladder bed there is a new multilocular fluid collection measuring approximately 4.6 x 2.6 cm. The adjacent cholecystectomy surgical clips are noted. A small amount of pneumobilia is noted. There appears to be mild fat stranding changes seen adjacent to the gallbladder bed at the level of the proximal bile duct stent. No free air is noted. Pancreas: Unremarkable. No pancreatic ductal dilatation or surrounding inflammatory changes. Spleen: The spleen is mildly enlarged measuring 15 cm in craniocaudad dimension. Adrenals/Urinary Tract: Both adrenal glands appear normal. The kidneys and collecting system appear normal without evidence of urinary  tract calculus or hydronephrosis. Bladder is partially decompressed. Stomach/Bowel: The stomach, small bowel, and colon are normal in appearance. No inflammatory changes, wall thickening, or obstructive findings.A moderate to large amount of colonic stool is present throughout. The appendix is unremarkable. Vascular/Lymphatic: There are no enlarged mesenteric, retroperitoneal, or pelvic lymph nodes. No significant vascular findings are present. Reproductive: The patient is status post hysterectomy. No adnexal masses or collections seen. Other: No evidence of abdominal wall mass or hernia. Musculoskeletal: No acute or significant osseous findings. IMPRESSION: Multilocular fluid collection within the gallbladder bed measuring 4.6 x 2.6 cm which could be due to biloma, or postprocedural seroma. Mild inflammatory changes seen within the porta hepatis No definite evidence of pancreatitis Findings suggestive of cirrhosis and portal hypertension Electronically Signed   By: BPrudencio PairM.D.   On: 05/20/2020 19:13   DG ERCP  Result Date: 05/13/2020 CLINICAL DATA:  Bile leak post cholecystectomy, status post endoscopic stenting EXAM: ERCP TECHNIQUE: Multiple spot images obtained with the fluoroscopic device and submitted for interpretation post-procedure. COMPARISON:  11/07/2019 FINDINGS: Series of fluoroscopic spot images document endoscopic cannulation and opacification of the CBD with removal of plastic endoscopic stent, passage of balloon catheter. Leak from the cystic duct stump is again identified. Metallic biliary stent was then deployed through the CBD into the duodenum. IMPRESSION: Persistent cystic duct stump leak. Metallic biliary stent placement. These images were submitted for radiologic interpretation only. Please see the procedural report for the amount of contrast and the fluoroscopy time utilized. Electronically Signed   By: DLucrezia EuropeM.D.   On: 05/13/2020 09:37   NM HEPATO BILIARY LEAK  Result  Date: 05/21/2020 CLINICAL DATA:  Status post cholecystectomy with posterior gallbladder wall left in situ, persistent bile leak status post clogged stent removal with metallic stent placement on 05/13/2020 EXAM: NUCLEAR MEDICINE HEPATOBILIARY IMAGING TECHNIQUE: Sequential images  of the abdomen were obtained out to 60 minutes following intravenous administration of radiopharmaceutical. RADIOPHARMACEUTICALS:  5.1 mCi Tc-53m Choletec IV COMPARISON:  None. FINDINGS: Prompt uptake and biliary excretion of activity by the liver is seen. Biliary activity passes into small bowel, consistent with patent common bile duct. No evidence of mild leak. IMPRESSION: No evidence of bile leak. Electronically Signed   By: SJulian HyM.D.   On: 05/21/2020 11:56    Labs:  CBC: Recent Labs    05/20/20 1152 05/21/20 0506 05/22/20 0537 05/23/20 0546  WBC 6.2 6.2 6.2 6.0  HGB 9.6* 9.6* 9.3* 10.1*  HCT 30.2* 31.7* 31.1* 34.6*  PLT 222.0 221 224 222    COAGS: Recent Labs    08/05/19 0530 05/20/20 2100  INR 1.2 1.6*  APTT 30 58*    BMP: Recent Labs    08/09/19 0431 08/11/19 0445 11/07/19 1204 02/16/20 0520 05/20/20 1152 05/21/20 0506 05/22/20 0537 05/23/20 0546  NA 138 142 140 138 138 139 139 139  K 3.6 4.0 4.9 3.9 4.3 4.1 4.2 4.2  CL 107 104 108 107 106 108 107 106  CO2 20* 29 20* 17* 22 21* 23 21*  GLUCOSE 181* 221* 321* 339* 183* 105* 105* 142*  BUN 15 8 31* 39* 25* 18 22* 20  CALCIUM 8.1* 8.2* 8.4* 8.1* 9.2 8.9 8.7* 8.9  CREATININE 0.51 0.40* 0.57 1.08* 0.61 0.47 0.58 0.72  GFRNONAA >60 >60 >60 58*  --  >60 >60 >60  GFRAA >60 >60 >60 >60  --   --   --   --     LIVER FUNCTION TESTS: Recent Labs    05/20/20 1152 05/21/20 0506 05/22/20 0537 05/23/20 0546  BILITOT 0.3 0.3 0.6 0.5  AST 44* 50* 53* 50*  ALT 41* 42 38 37  ALKPHOS 244* 228* 216* 215*  PROT 7.6 7.3 6.9 7.0  ALBUMIN 3.7 3.2* 3.2* 3.4*    TUMOR MARKERS: No results for input(s): AFPTM, CEA, CA199, CHROMGRNA in  the last 8760 hours.  Assessment and Plan:  Gallbladder fossa fluid collection: TChalmers Cater SMcilrath 54year old female, is tent[facility] image-guided gallbladder fluid collection aspiration with possible drain placement.   Risks and benefits discussed with the patient including bleeding, infection, damage to adjacent structures and sepsis.  All of the patient's questions were answered, patient is agreeable to proceed.  The patient has been NPO. Labs and vitals have been reviewed.   Consent signed and in chart.  Thank you for this interesting consult.  I greatly enjoyed meeting Kim HOVSEPIANand look forward to participating in their care.  A copy of this report was sent to the requesting provider on this date.  Electronically Signed: JSoyla Dryer AGACNP-BC [id number]/30/2021, 9:52 AM   I spent a total of 20 Minutes    in face to face in clinical consultation, greater than 50% of which was counseling/coordinating care for image-guided gallbladder fossa fluid collection aspiration with possible drain placement.

## 2020-05-23 NOTE — Telephone Encounter (Signed)
[name] from [facility] states the pt had a procedure done today and she would like to touch base with Dr Carlean Purl to see what the pt can drink and eat.

## 2020-05-24 DIAGNOSIS — K668 Other specified disorders of peritoneum: Secondary | ICD-10-CM | POA: Diagnosis not present

## 2020-05-24 LAB — COMPREHENSIVE METABOLIC PANEL
ALT: 35 U/L (ref 0–44)
AST: 50 U/L — ABNORMAL HIGH (ref 15–41)
Albumin: 3.2 g/dL — ABNORMAL LOW (ref 3.5–5.0)
Alkaline Phosphatase: 187 U/L — ABNORMAL HIGH (ref 38–126)
Anion gap: 12 (ref 5–15)
BUN: 14 mg/dL (ref 6–20)
CO2: 20 mmol/L — ABNORMAL LOW (ref 22–32)
Calcium: 8.6 mg/dL — ABNORMAL LOW (ref 8.9–10.3)
Chloride: 106 mmol/L (ref 98–111)
Creatinine, Ser: 0.6 mg/dL (ref 0.44–1.00)
GFR, Estimated: >60 mL/min (ref >60)
Glucose, Bld: 105 mg/dL — ABNORMAL HIGH (ref 70–99)
Potassium: 4.1 mmol/L (ref 3.5–5.1)
Sodium: 138 mmol/L (ref 135–145)
Total Bilirubin: 0.9 mg/dL (ref 0.3–1.2)
Total Protein: 6.7 g/dL (ref 6.5–8.1)

## 2020-05-24 LAB — CBC WITH DIFFERENTIAL/PLATELET
Abs Immature Granulocytes: 0.07 10*3/uL (ref 0.00–0.07)
Basophils Absolute: 0 10*3/uL (ref 0.0–0.1)
Basophils Relative: 0 %
Eosinophils Absolute: 0.2 10*3/uL (ref 0.0–0.5)
Eosinophils Relative: 2 %
HCT: 33.8 % — ABNORMAL LOW (ref 36.0–46.0)
Hemoglobin: 9.5 g/dL — ABNORMAL LOW (ref 12.0–15.0)
Immature Granulocytes: 1 %
Lymphocytes Relative: 26 %
Lymphs Abs: 1.7 10*3/uL (ref 0.7–4.0)
MCH: 24.9 pg — ABNORMAL LOW (ref 26.0–34.0)
MCHC: 28.1 g/dL — ABNORMAL LOW (ref 30.0–36.0)
MCV: 88.7 fL (ref 80.0–100.0)
Monocytes Absolute: 0.4 10*3/uL (ref 0.1–1.0)
Monocytes Relative: 5 %
Neutro Abs: 4.5 10*3/uL (ref 1.7–7.7)
Neutrophils Relative %: 66 %
Platelets: 228 10*3/uL (ref 150–400)
RBC: 3.81 MIL/uL — ABNORMAL LOW (ref 3.87–5.11)
RDW: 15 % (ref 11.5–15.5)
WBC: 6.8 10*3/uL (ref 4.0–10.5)
nRBC: 0 % (ref 0.0–0.2)

## 2020-05-24 LAB — GLUCOSE, CAPILLARY
Glucose-Capillary: 102 mg/dL — ABNORMAL HIGH (ref 70–99)
Glucose-Capillary: 76 mg/dL (ref 70–99)
Glucose-Capillary: 98 mg/dL (ref 70–99)
Glucose-Capillary: 80 mg/dL (ref 70–99)
Glucose-Capillary: 78 mg/dL (ref 70–99)
Glucose-Capillary: 134 mg/dL — ABNORMAL HIGH (ref 70–99)

## 2020-05-24 LAB — LIPASE, BLOOD: Lipase: 51 U/L (ref 11–51)

## 2020-05-24 MED ORDER — MAGNESIUM CITRATE PO SOLN
1.0000 | Freq: Once | ORAL | Status: AC
  Administered 2020-05-24: 1 via ORAL
  Filled 2020-05-24: qty 296

## 2020-05-24 MED ORDER — BISACODYL 10 MG RE SUPP
10.0000 mg | Freq: Once | RECTAL | Status: AC
  Administered 2020-05-24: 10 mg via RECTAL

## 2020-05-24 MED ORDER — SODIUM CHLORIDE 0.9% FLUSH
5.0000 mL | Freq: Three times a day (TID) | INTRAVENOUS | Status: DC
  Administered 2020-05-24 – 2020-05-30 (×10): 5 mL

## 2020-05-24 MED ORDER — POLYETHYLENE GLYCOL 3350 17 G PO PACK: 17.0000 g | PACK | Freq: Every day | ORAL | Status: DC | PRN

## 2020-05-24 NOTE — Progress Notes (Addendum)
Progress Note  Chief Complaint:   RUQ pain      ASSESSMENT / PLAN:     # 54 year old female s/p cholecystectomy March 4098 complicated bypersistentcystic duct leak. Repeat ERCP on 12/20 again showed persistent cystic duct leakas well asoccluded plastic biliary stent. Stentexchanged for a fullycovered metalbiliarystent. Patient subsequently admitted withsevereRUQ pain.  CT scan showed cirrhosis with portal HTN, multilocular fluid collection 4.6 x 2.6 cm in gallbladder bed likely biloma. HIDA 12/28 without evidence of a bile leak. She continued to have signiicant abdominal pain.  She is  s/p percutaneous drain placement by IR yesterday. CT suggested air in GB fossa communicating with air in biliary tree c/w fistula . Minimal fluid but drain placed anyway to try and heal biliary fistula.  --Alk phos continues to improve post stent exchange. Bilirubin normal. Biliary drain with small amount of bloody drainage --WBC normal  On Zosyn.  --Will obtain MRCP. I have asked her to not have anything to eat or drink for now ( needs NPO for 4 hours for MRCP)  # Acute on chronic constipation.  --Spoke with patient and RN. She will get suppository now. If not helpful then Miralax daily as needed      SUBJECTIVE:   Still with significant RUQ pain. Poor appetite. Hasn't a BM in days but hasn't used the prescribed suppository    OBJECTIVE:    Scheduled inpatient medications:  . bisacodyl  10 mg Rectal Once  . brexpiprazole  2 mg Oral QHS  . ezetimibe  10 mg Oral Daily  . feeding supplement  237 mL Oral BID BM  . insulin aspart  0-9 Units Subcutaneous TID WC  . QUEtiapine  800 mg Oral QHS  . saccharomyces boulardii  250 mg Oral BID  . simvastatin  10 mg Oral QAC breakfast  . sodium chloride flush  5 mL Intracatheter Q8H  . topiramate  200 mg Oral BID  . vortioxetine HBr  20 mg Oral QHS  . ziprasidone  20 mg Oral QHS   Continuous inpatient infusions:  . sodium chloride 100 mL/hr  at 05/24/20 0531  . lactated ringers Stopped (05/23/20 2143)  . piperacillin-tazobactam 3.375 g (05/24/20 0934)   PRN inpatient medications: acetaminophen **OR** acetaminophen, albuterol, ketorolac, morphine injection, ondansetron **OR** ondansetron (ZOFRAN) IV, oxyCODONE, promethazine  Vital signs in last 24 hours: Temp:  [97.8 F (36.6 C)-98 F (36.7 C)] 97.9 F (36.6 C) (12/31 0442) Pulse Rate:  [76-101] 101 (12/31 0442) Resp:  [14-20] 16 (12/31 0442) BP: (112-136)/(59-78) 112/61 (12/31 0442) SpO2:  [90 %-100 %] 95 % (12/31 0442) Last BM Date: 05/15/20  Intake/Output Summary (Last 24 hours) at 05/24/2020 1009 Last data filed at 05/24/2020 0650 Gross per 24 hour  Intake 1809.64 ml  Output 1650 ml  Net 159.64 ml     Physical Exam:  . General: Alert female in NAD . Heart:  Regular rate and rhythm. No lower extremity edema . Pulmonary: Normal respiratory effort . Abdomen: Soft, nondistended, RUQ  tender. Normal bowel sounds.  . Neurologic: Alert and oriented . Psych: Pleasant. Cooperative.   Filed Weights   05/21/20 1815  Weight: 72.4 kg    Intake/Output from previous day: 12/30 0701 - 12/31 0700 In: 1809.6 [I.V.:1481.6; IV Piggyback:300] Out: 1191 [Urine:1650] Intake/Output this shift: No intake/output data recorded.    Lab Results: Recent Labs    05/22/20 0537 05/23/20 0546 05/24/20 0605  WBC 6.2 6.0 6.8  HGB 9.3* 10.1* 9.5*  HCT 31.1*  34.6* 33.8*  PLT 224 222 228   BMET Recent Labs    05/22/20 0537 05/23/20 0546 05/24/20 0605  NA 139 139 138  K 4.2 4.2 4.1  CL 107 106 106  CO2 23 21* 20*  GLUCOSE 105* 142* 105*  BUN 22* 20 14  CREATININE 0.58 0.72 0.60  CALCIUM 8.7* 8.9 8.6*   LFT Recent Labs    05/24/20 0605  PROT 6.7  ALBUMIN 3.2*  AST 50*  ALT 35  ALKPHOS 187*  BILITOT 0.9   PT/INR No results for input(s): LABPROT, INR in the last 72 hours. Hepatitis Panel No results for input(s): HEPBSAG, HCVAB, HEPAIGM, HEPBIGM in the last  72 hours.  US Abdomen Limited  Result Date: 05/23/2020 CLINICAL DATA:  Gallbladder fossa fluid collection after prior cholecystectomy. Assessment for ultrasound-guided aspiration. EXAM: ULTRASOUND ABDOMEN LIMITED RIGHT UPPER QUADRANT COMPARISON:  CT of the abdomen on 05/20/2020 FINDINGS: The focal fluid noted in the gallbladder fossa by recent CT is not definitively visualized by ultrasound. Shadowing is identified from clips in the gallbladder fossa. No biliary ductal dilatation identified. No perihepatic free fluid seen. IMPRESSION: Gallbladder fossa fluid is not visualized by ultrasound. The patient was transported to CT for imaging for possible CT-guided aspiration. Electronically Signed   By: Aletta Edouard M.D.   On: 05/23/2020 15:04   CT IMAGE GUIDED FLUID DRAIN BY CATHETER  Result Date: 05/23/2020 CLINICAL DATA:  Status post cholecystectomy in March which was complicated by a bile leak treated with placement of an endoscopic biliary stent. The stent has been exchanged and eventually replaced for a covered self expanding metallic stent on 07/86/7544. The patient has had persistent pain and CT on 05/20/2020 demonstrates a fluid collection in the gallbladder fossa. Nuclear medicine study on 05/21/2020 did not demonstrate an active bile leak. Due to persistent severe pain, the patient now presents for aspiration and possible catheter drainage of the gallbladder fossa fluid collection. EXAM: CT GUIDED PERCUTANEOUS CATHETER DRAINAGE OF GALLBLADDER FOSSA ABSCESS ANESTHESIA/SEDATION: 3.0 mg IV Versed 100 mcg IV Fentanyl Total Moderate Sedation Time:  12 minutes The patient's level of consciousness and physiologic status were continuously monitored during the procedure by Radiology nursing. PROCEDURE: The procedure, risks, benefits, and alternatives were explained to the patient. Questions regarding the procedure were encouraged and answered. The patient understands and consents to the procedure. A time  out was performed prior to initiating the procedure. CT was performed of the abdomen in a supine position. The right abdominal wall was prepped with chlorhexidine in a sterile fashion, and a sterile drape was applied covering the operative field. A sterile gown and sterile gloves were used for the procedure. Local anesthesia was provided with 1% Lidocaine. Under CT guidance, an 18 gauge trocar needle was advanced into the gallbladder fossa. A guidewire was advanced and the needle removed. The tract was dilated and a 10 French percutaneous drainage catheter advanced over the wire. Drain position was confirmed by CT. Fluid was aspirated from the drain and sent for culture analysis. Final drain positioning was confirmed by CT. The drainage catheter was connected to a suction bulb and secured at the skin with a Prolene retention suture and StatLock device. COMPLICATIONS: None FINDINGS: Preliminary CT now shows a change in appearance at the level of the gallbladder fossa with predominantly air present in the gallbladder fossa and a small dependent air-fluid level. This air appears to communicate with air in the intrahepatic biliary tree and air in the common bile duct as well  as what appears to be air in the cystic duct stump. 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 Of note, and also apparent on the recent CT of 05/20/2020, is clear distal migration of the recently placed metallic biliary stent which currently barely extends into the distal CBD and mostly extends into the duodenal lumen. The metallic biliary stent does not cover the cystic duct stump insertion. After placement of the drainage catheter in the gallbladder fossa, aspiration yielded approximately 5 mL of bloody fluid which was sent for culture analysis. The air component in the gallbladder fossa was decompressed after drain placement. IMPRESSION: 1. CT-guided placement of 10 French  percutaneous drainage catheter within the gallbladder fossa. Approximately 5 mL of bloody fluid was aspirated and sent for culture analysis. 2. Initial CT demonstrated a change in appearance since the prior CT with now predominantly air present in the gallbladder fossa with a small air-fluid level. This air communicates with air in the cystic duct stump and biliary tree consistent with a fistula to the biliary tree. The previously placed endoscopic covered metallic biliary stent has also clearly migrated distally and is mostly in the duodenum with the proximal portion just extending into the CBD. The metallic stent does not cover the cystic duct stump insertion. Electronically Signed   By: Aletta Edouard M.D.   On: 05/23/2020 17:19      Principal Problem:   Biloma Active Problems:   Insulin dependent type 2 diabetes mellitus ([place])   Hyperlipidemia associated with type 2 diabetes mellitus ([place])   Hypertension associated with diabetes ([place])   Other cirrhosis of liver ([place])   Bile leak   Abdominal pain   Malnutrition of moderate degree     LOS: 3 days   [name] ,NP 05/24/2020, 10:09 AM   Attending physician's note   I have taken an interval history, reviewed the chart and examined the patient. I agree with the Advanced Practitioner's note, impression and recommendations.   H/O acute cholecystitis s/p subtotal cholecystectomy March 7741 complicated by persistent cystic duct leak despite plastic (occluded) and metal biliary stent placement 12/20 s/p gallbladder fossa 10Fr drain placement by IR 05/23/2020.  CT demonstrated focal area in the gallbladder which appeared to communicate with air in the biliary tree ?Choledochal fistula/persistent leak  HIDA did reveal patent SEMS previously  Subhepatic drain-serosanguineous.  No bile  Plan: -Continue pain control -MRCP in a.m. Also need to check position of covered stent. -Encourage p.o. -Continue IV Zosyn. -Trial of suppositories  for constipation. -Trend LFTs, CBC    Carmell Austria, MD [facility] [phone]

## 2020-05-24 NOTE — Progress Notes (Signed)
Referring Physician(s): Jackquline Denmark (GI)  Supervising Physician: Jacqulynn Cadet  Patient Status:  [facility] - In-pt  Chief Complaint: "Pain at drain" and "low back pain"  Subjective:  History of acute cholecystitis s/p cholecystectomy in 10/6438; complicated by persistent cystic duck leak causing biloma despite plastic (occluded) and metal biliary stent placement s/p gallbladder fossa drain placement in IR (for hopes at healing biliary fistula) 05/23/2020. Patient awake and alert laying in bed. Complains of tenderness at drain insertion site, as expected. Complains of chronic low back pain, stable. Husband at bedside. Gallbladder fossa drain site c/d/i.   Allergies: Talwin [pentazocine]  Medications: Prior to Admission medications   Medication Sig Start Date End Date Taking? Authorizing Provider  albuterol (VENTOLIN HFA) 108 (90 Base) MCG/ACT inhaler Inhale 2 puffs into the lungs every 6 (six) hours as needed for shortness of breath or wheezing. 02/27/20  Yes [provider]  brexpiprazole (REXULTI) 2 MG TABS tablet Take 2 mg by mouth at bedtime.   Yes [provider]  calcium carbonate (OSCAL) 1500 (600 Ca) MG TABS tablet Take 600 mg of elemental calcium by mouth daily with breakfast.   Yes [provider]  Empagliflozin-metFORMIN HCl ER (SYNJARDY XR) 25-1000 MG TB24 Take 1 tablet by mouth daily before breakfast.  11/15/18  Yes [provider]  ezetimibe (ZETIA) 10 MG tablet Take 10 mg by mouth daily. 04/25/20  Yes [provider]  ferrous sulfate 325 (65 FE) MG tablet Take 325 mg by mouth daily with breakfast.   Yes [provider]  Insulin Glargine (BASAGLAR KWIKPEN) 100 UNIT/ML Inject 38 Units into the skin at bedtime. 03/20/20  Yes [provider]  LINZESS 290 MCG CAPS capsule Take 290 mcg by mouth every morning. 08/16/19  Yes [provider]  lisinopril (ZESTRIL) 20 MG tablet TAKE 1 TABLET BY MOUTH EVERY  DAY Patient taking differently: Take 20 mg by mouth daily. 02/08/20  Yes [name], MD  metFORMIN (GLUCOPHAGE) 1000 MG tablet Take 1,000 mg by mouth at bedtime. 02/27/20  Yes [provider]  Omega-3 Fatty Acids (OMEGA 3 PO) Take 500 mg by mouth daily.    Yes [provider]  prazosin (MINIPRESS) 2 MG capsule Take 4 mg by mouth at bedtime.   Yes [provider]  pregabalin (LYRICA) 100 MG capsule TAKE 1 CAPSULE BY MOUTH THREE TIMES A DAY Patient taking differently: Take 100 mg by mouth 3 (three) times daily. 02/12/20  Yes [name], NP  QUEtiapine (SEROQUEL) 400 MG tablet Take 800 mg by mouth at bedtime.   Yes [provider]  simvastatin (ZOCOR) 10 MG tablet Take 10 mg by mouth daily before breakfast.  12/13/17  Yes [provider]  topiramate (TOPAMAX) 200 MG tablet Take 200 mg by mouth 2 (two) times daily. 04/12/20  Yes [provider]  traMADol ([name]) 50 MG tablet Take 50 mg by mouth daily as needed for moderate pain.  01/13/19  Yes [provider]  vortioxetine HBr ([name]) 20 MG TABS tablet Take 20 mg by mouth at bedtime.   Yes [provider]  ziprasidone ([name]) 20 MG capsule Take 20 mg by mouth at bedtime.   Yes [provider]  Dulaglutide 4.5 MG/0.5ML SOPN Inject 4 mg into the skin once a week. Monday 12/20/18   [provider]     Vital Signs: BP 112/61   Pulse (!) 101   Temp 97.9 F (36.6 C) (Oral)   Resp 16  Ht 5' 4"  (1.626 m)   Wt 159 lb 9.8 oz (72.4 kg)   SpO2 95%   BMI 27.40 kg/m   Physical Exam Vitals and nursing note reviewed.  Constitutional:      General: She is not in acute distress.    Appearance: Normal appearance.  Pulmonary:     Effort: Pulmonary effort is normal. No respiratory distress.  Abdominal:     Comments: Gallbladder fossa drain site with mild tenderness, no erythema, drainage or active bleeding; approximately 10-15 cc bloody fluid with lots  of air in suction bulb.  Skin:    General: Skin is warm and dry.  Neurological:     Mental Status: She is alert and oriented to person, place, and time.     Imaging: CT ABDOMEN PELVIS W CONTRAST  Result Date: 05/20/2020 CLINICAL DATA:  Recent ERCP with stent placement, prior cholecystectomy EXAM: CT ABDOMEN AND PELVIS WITH CONTRAST TECHNIQUE: Multidetector CT imaging of the abdomen and pelvis was performed using the standard protocol following bolus administration of intravenous contrast. CONTRAST:  158m OMNIPAQUE IOHEXOL 300 MG/ML  SOLN COMPARISON:  March 12, 2020 FINDINGS: Lower chest: The visualized heart size within normal limits. No pericardial fluid/thickening. No hiatal hernia. The visualized portions of the lungs are clear. Hepatobiliary: There is a slightly nodular liver contour which is diffusely low signal throughout. The portal vein appears to be patent. The patient is status post common bile duct stent placement. Within the area of the gallbladder bed there is a new multilocular fluid collection measuring approximately 4.6 x 2.6 cm. The adjacent cholecystectomy surgical clips are noted. A small amount of pneumobilia is noted. There appears to be mild fat stranding changes seen adjacent to the gallbladder bed at the level of the proximal bile duct stent. No free air is noted. Pancreas: Unremarkable. No pancreatic ductal dilatation or surrounding inflammatory changes. Spleen: The spleen is mildly enlarged measuring 15 cm in craniocaudad dimension. Adrenals/Urinary Tract: Both adrenal glands appear normal. The kidneys and collecting system appear normal without evidence of urinary tract calculus or hydronephrosis. Bladder is partially decompressed. Stomach/Bowel: The stomach, small bowel, and colon are normal in appearance. No inflammatory changes, wall thickening, or obstructive findings.A moderate to large amount of colonic stool is present throughout. The appendix is unremarkable.  Vascular/Lymphatic: There are no enlarged mesenteric, retroperitoneal, or pelvic lymph nodes. No significant vascular findings are present. Reproductive: The patient is status post hysterectomy. No adnexal masses or collections seen. Other: No evidence of abdominal wall mass or hernia. Musculoskeletal: No acute or significant osseous findings. IMPRESSION: Multilocular fluid collection within the gallbladder bed measuring 4.6 x 2.6 cm which could be due to biloma, or postprocedural seroma. Mild inflammatory changes seen within the porta hepatis No definite evidence of pancreatitis Findings suggestive of cirrhosis and portal hypertension Electronically Signed   By: BPrudencio PairM.D.   On: 05/20/2020 19:13   UKoreaAbdomen Limited  Result Date: 05/23/2020 CLINICAL DATA:  Gallbladder fossa fluid collection after prior cholecystectomy. Assessment for ultrasound-guided aspiration. EXAM: ULTRASOUND ABDOMEN LIMITED RIGHT UPPER QUADRANT COMPARISON:  CT of the abdomen on 05/20/2020 FINDINGS: The focal fluid noted in the gallbladder fossa by recent CT is not definitively visualized by ultrasound. Shadowing is identified from clips in the gallbladder fossa. No biliary ductal dilatation identified. No perihepatic free fluid seen. IMPRESSION: Gallbladder fossa fluid is not visualized by ultrasound. The patient was transported to CT for imaging for possible CT-guided aspiration. Electronically Signed   By: GJenness CornerD.  On: 05/23/2020 15:04   CT IMAGE GUIDED FLUID DRAIN BY CATHETER  Result Date: 05/23/2020 CLINICAL DATA:  Status post cholecystectomy in March which was complicated by a bile leak treated with placement of an endoscopic biliary stent. The stent has been exchanged and eventually replaced for a covered self expanding metallic stent on 38/18/2993. The patient has had persistent pain and CT on 05/20/2020 demonstrates a fluid collection in the gallbladder fossa. Nuclear medicine study on 05/21/2020 did not  demonstrate an active bile leak. Due to persistent severe pain, the patient now presents for aspiration and possible catheter drainage of the gallbladder fossa fluid collection. EXAM: CT GUIDED PERCUTANEOUS CATHETER DRAINAGE OF GALLBLADDER FOSSA ABSCESS ANESTHESIA/SEDATION: 3.0 mg IV Versed 100 mcg IV Fentanyl Total Moderate Sedation Time:  12 minutes The patient's level of consciousness and physiologic status were continuously monitored during the procedure by Radiology nursing. PROCEDURE: The procedure, risks, benefits, and alternatives were explained to the patient. Questions regarding the procedure were encouraged and answered. The patient understands and consents to the procedure. A time out was performed prior to initiating the procedure. CT was performed of the abdomen in a supine position. The right abdominal wall was prepped with chlorhexidine in a sterile fashion, and a sterile drape was applied covering the operative field. A sterile gown and sterile gloves were used for the procedure. Local anesthesia was provided with 1% Lidocaine. Under CT guidance, an 18 gauge trocar needle was advanced into the gallbladder fossa. A guidewire was advanced and the needle removed. The tract was dilated and a 10 French percutaneous drainage catheter advanced over the wire. Drain position was confirmed by CT. Fluid was aspirated from the drain and sent for culture analysis. Final drain positioning was confirmed by CT. The drainage catheter was connected to a suction bulb and secured at the skin with a Prolene retention suture and StatLock device. COMPLICATIONS: None FINDINGS: Preliminary CT now shows a change in appearance at the level of the gallbladder fossa with predominantly air present in the gallbladder fossa and a small dependent air-fluid level. This air appears to communicate with air in the intrahepatic biliary tree and air in the common bile duct as well as what appears to be air in the cystic duct stump.  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 Of note, and also apparent on the recent CT of 05/20/2020, is clear distal migration of the recently placed metallic biliary stent which currently barely extends into the distal CBD and mostly extends into the duodenal lumen. The metallic biliary stent does not cover the cystic duct stump insertion. After placement of the drainage catheter in the gallbladder fossa, aspiration yielded approximately 5 mL of bloody fluid which was sent for culture analysis. The air component in the gallbladder fossa was decompressed after drain placement. IMPRESSION: 1. CT-guided placement of 10 French percutaneous drainage catheter within the gallbladder fossa. Approximately 5 mL of bloody fluid was aspirated and sent for culture analysis. 2. Initial CT demonstrated a change in appearance since the prior CT with now predominantly air present in the gallbladder fossa with a small air-fluid level. This air communicates with air in the cystic duct stump and biliary tree consistent with a fistula to the biliary tree. The previously placed endoscopic covered metallic biliary stent has also clearly migrated distally and is mostly in the duodenum with the proximal portion just extending into the CBD. The metallic stent does not cover the  cystic duct stump insertion. Electronically Signed   By: Aletta Edouard M.D.   On: 05/23/2020 17:19   NM HEPATO BILIARY LEAK  Result Date: 05/21/2020 CLINICAL DATA:  Status post cholecystectomy with posterior gallbladder wall left in situ, persistent bile leak status post clogged stent removal with metallic stent placement on 05/13/2020 EXAM: NUCLEAR MEDICINE HEPATOBILIARY IMAGING TECHNIQUE: Sequential images of the abdomen were obtained out to 60 minutes following intravenous administration of radiopharmaceutical. RADIOPHARMACEUTICALS:  5.1 mCi Tc-16m Choletec IV COMPARISON:  None. FINDINGS:  Prompt uptake and biliary excretion of activity by the liver is seen. Biliary activity passes into small bowel, consistent with patent common bile duct. No evidence of mild leak. IMPRESSION: No evidence of bile leak. Electronically Signed   By: SJulian HyM.D.   On: 05/21/2020 11:56    Labs:  CBC: Recent Labs    05/21/20 0506 05/22/20 0537 05/23/20 0546 05/24/20 0605  WBC 6.2 6.2 6.0 6.8  HGB 9.6* 9.3* 10.1* 9.5*  HCT 31.7* 31.1* 34.6* 33.8*  PLT 221 224 222 228    COAGS: Recent Labs    08/05/19 0530 05/20/20 2100  INR 1.2 1.6*  APTT 30 58*    BMP: Recent Labs    08/09/19 0431 08/11/19 0445 11/07/19 1204 02/16/20 0520 05/20/20 1152 05/21/20 0506 05/22/20 0537 05/23/20 0546 05/24/20 0605  NA 138 142 140 138   < > 139 139 139 138  K 3.6 4.0 4.9 3.9   < > 4.1 4.2 4.2 4.1  CL 107 104 108 107   < > 108 107 106 106  CO2 20* 29 20* 17*   < > 21* 23 21* 20*  GLUCOSE 181* 221* 321* 339*   < > 105* 105* 142* 105*  BUN 15 8 31* 39*   < > 18 22* 20 14  CALCIUM 8.1* 8.2* 8.4* 8.1*   < > 8.9 8.7* 8.9 8.6*  CREATININE 0.51 0.40* 0.57 1.08*   < > 0.47 0.58 0.72 0.60  GFRNONAA >60 >60 >60 58*  --  >60 >60 >60 >60  GFRAA >60 >60 >60 >60  --   --   --   --   --    < > = values in this interval not displayed.    LIVER FUNCTION TESTS: Recent Labs    05/21/20 0506 05/22/20 0537 05/23/20 0546 05/24/20 0605  BILITOT 0.3 0.6 0.5 0.9  AST 50* 53* 50* 50*  ALT 42 38 37 35  ALKPHOS 228* 216* 215* 187*  PROT 7.3 6.9 7.0 6.7  ALBUMIN 3.2* 3.2* 3.4* 3.2*    Assessment and Plan:  History of acute cholecystitis s/p cholecystectomy in 35/9563 complicated by persistent cystic duck leak causing biloma despite plastic (occluded) and metal biliary stent placement s/p gallbladder fossa drain placement in IR (for hopes at healing biliary fistula) 05/23/2020. Gallbladder fossa drain stable with approximately 10-15 cc bloody fluid with lots of air in suction bulb. Continue current  drain management- continue with Qshift management of output; per Dr. YKathlene Coteswitch flushing to QD (as opposed to Qshift- orders updated). For possible MRCP today per GI. Further plans per TRH/GI- appreciate and agree with management. IR to follow.   Electronically Signed: AEarley Abide PA-C 05/24/2020, 1:23 PM   I spent a total of 25 Minutes at the the patient's bedside AND on the patient's hospital floor or unit, greater than 50% of which was counseling/coordinating care for biloma s/p gallbladder fossa drain placement.

## 2020-05-24 NOTE — Progress Notes (Signed)
PROGRESS NOTE   Kim Hicks  ALP:[id number] DOB: Dec 21, 1965 DOA: 05/20/2020 PCP: [name], FNP  Brief Narrative:  54 year old white female from home HTN, HLD, depression with psychosis NIDDM complicated by diabetic neuropathy severe cirrhosis of liver Lap chole 08/04/2019-difficult case-backwall of gallbladder left in place-found to have a contained bile leak status post ERCP sphincterotomy and stent 08/07/2019 followed by [facility]-was discharged on Augmentin  Subsequently 11/07/2019 underwent ERCP stent was exchanged in common bile duct with a new stent upsized 10 French plastic stent  In the interim was admitted at [facility] [place] 03/11/2020 found to have E. coli in urine blood cultures 1 out of 4 bottles E. coli was treated with Levaquin Found to have RIb # 8th rib  Underwent a final repeat ERCP 12/20 with persistent bile leak clogged stent removed metal stent placed Developed on 12/21 progressive worsening R LQ pain diaphoresis chills-seen at GI clinic 12/27 and she was sent to [facility] long ED for imaging and further work-up  CT abdomen pelvis = multilocular fluid collection 4.6 X2.6 biloma/postprocedural seroma-GI consulted Zosyn started IR consulted   Assessment & Plan:   Principal Problem:   Biloma Active Problems:   Insulin dependent type 2 diabetes mellitus ([place])   Hyperlipidemia associated with type 2 diabetes mellitus ([place])   Hypertension associated with diabetes ([place])   Other cirrhosis of liver ([place])   Bile leak   Abdominal pain   Malnutrition of moderate degree  Biloma versus postop seroma status post lap cholecystectomy Continue OxyIR 10 every 6 as needed, morphine 2 to 4 mg every 2 as needed in addition to Toradol 30 every 6 as needed for moderate pain--at baseline does not take medications for pain --this is not resolved Percutaneous drain 10 French placed 12/30 and CT demonstrating choledocho fistula? Persistent leak Plan for MRCP 05/25/2020 check on possible  fistula and stent placement Constipation for several days-seems chronic Enema did not help today Changed MiraLAX to mag citrate to see if this helps with abdominal pain Insulin-dependent DM TY 2 with severe neuropathy Currently 78-1 05 Continue current sliding scale management  Holding Basaglar 38 Metformin 1000 at bedtime and Synjardy for now Cirrhosis Secondary to NAFLD?-Outpatient follow-up with GI HTN  continue lisinopril 20, prazosin 4 Outpatient further adjustment Anemia chronic disease  Currently stable Depression anxiety  Continue Topamax 200 twice daily, vortioxetine 20 Seroquel iii/day Rexulti 2 at bedtime  DVT prophylaxis: SCD Code Status: Full Family Communication: Discussed with husband who is present at the bedside Disposition:  Status is: Inpatient  Remains inpatient appropriate because:Hemodynamically unstable and Ongoing active pain requiring inpatient pain management   Dispo: The patient is from: Home              Anticipated d/c is to: Home              Anticipated d/c date is: 3 days              Patient currently is not medically stable to d/c.       Consultants:   GI  IR  Procedures: None  Antimicrobials: None   Subjective: Coherent pleasant still in pain receiving all as needed's for pain Only on clears but has decided to try for MRI 05/25/2020 No chest pain No fever passing gas but no stool for several days it sounds like Objective: Vitals:   05/23/20 1545 05/23/20 1618 05/23/20 2033 05/24/20 0442  BP: 129/68 (!) 128/59 124/63 112/61  Pulse: 82 89 87 (!) 101  Resp:  14 16 20 16   Temp:  98 F (36.7 C) 97.8 F (36.6 C) 97.9 F (36.6 C)  TempSrc:  Oral Oral Oral  SpO2: 100% 95% 90% 95%  Weight:      Height:        Intake/Output Summary (Last 24 hours) at 05/24/2020 1413 Last data filed at 05/24/2020 3903 Gross per 24 hour  Intake 1809.64 ml  Output 1050 ml  Net 759.64 ml   Filed Weights   05/21/20 1815  Weight: 72.4 kg     Examination:  EOMI NCAT no icterus no pallor pleasant but in some distress chest clear no added sound rales rhonchi Abdomen distended no rebound no guarding No lower extremity edema Neurologically intact  Data Reviewed: I have personally reviewed following labs and imaging studies Sodium 138 potassium 4.1 BUNs/creatinine 25/0.6---->20/0.7-->14/0.6  Alk phos 244-->216-->215-->18 6 7  AST/ALT = 44/41-->53/38-->50/37-->50/35  White count 6.8 hemoglobin 9.5    Radiology Studies: US Abdomen Limited  Result Date: 05/23/2020 CLINICAL DATA:  Gallbladder fossa fluid collection after prior cholecystectomy. Assessment for ultrasound-guided aspiration. EXAM: ULTRASOUND ABDOMEN LIMITED RIGHT UPPER QUADRANT COMPARISON:  CT of the abdomen on 05/20/2020 FINDINGS: The focal fluid noted in the gallbladder fossa by recent CT is not definitively visualized by ultrasound. Shadowing is identified from clips in the gallbladder fossa. No biliary ductal dilatation identified. No perihepatic free fluid seen. IMPRESSION: Gallbladder fossa fluid is not visualized by ultrasound. The patient was transported to CT for imaging for possible CT-guided aspiration. Electronically Signed   By: Aletta Edouard M.D.   On: 05/23/2020 15:04   CT IMAGE GUIDED FLUID DRAIN BY CATHETER  Result Date: 05/23/2020 CLINICAL DATA:  Status post cholecystectomy in March which was complicated by a bile leak treated with placement of an endoscopic biliary stent. The stent has been exchanged and eventually replaced for a covered self expanding metallic stent on 00/92/3300. The patient has had persistent pain and CT on 05/20/2020 demonstrates a fluid collection in the gallbladder fossa. Nuclear medicine study on 05/21/2020 did not demonstrate an active bile leak. Due to persistent severe pain, the patient now presents for aspiration and possible catheter drainage of the gallbladder fossa fluid collection. EXAM: CT GUIDED PERCUTANEOUS CATHETER  DRAINAGE OF GALLBLADDER FOSSA ABSCESS ANESTHESIA/SEDATION: 3.0 mg IV Versed 100 mcg IV Fentanyl Total Moderate Sedation Time:  12 minutes The patient's level of consciousness and physiologic status were continuously monitored during the procedure by Radiology nursing. PROCEDURE: The procedure, risks, benefits, and alternatives were explained to the patient. Questions regarding the procedure were encouraged and answered. The patient understands and consents to the procedure. A time out was performed prior to initiating the procedure. CT was performed of the abdomen in a supine position. The right abdominal wall was prepped with chlorhexidine in a sterile fashion, and a sterile drape was applied covering the operative field. A sterile gown and sterile gloves were used for the procedure. Local anesthesia was provided with 1% Lidocaine. Under CT guidance, an 18 gauge trocar needle was advanced into the gallbladder fossa. A guidewire was advanced and the needle removed. The tract was dilated and a 10 French percutaneous drainage catheter advanced over the wire. Drain position was confirmed by CT. Fluid was aspirated from the drain and sent for culture analysis. Final drain positioning was confirmed by CT. The drainage catheter was connected to a suction bulb and secured at the skin with a Prolene retention suture and StatLock device. COMPLICATIONS: None FINDINGS: Preliminary CT now shows a change in appearance  at the level of the gallbladder fossa with predominantly air present in the gallbladder fossa and a small dependent air-fluid level. This air appears to communicate with air in the intrahepatic biliary tree and air in the common bile duct as well as what appears to be air in the cystic duct stump. 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 Of note, and also apparent on the recent CT of 05/20/2020, is clear distal migration of the recently placed  metallic biliary stent which currently barely extends into the distal CBD and mostly extends into the duodenal lumen. The metallic biliary stent does not cover the cystic duct stump insertion. After placement of the drainage catheter in the gallbladder fossa, aspiration yielded approximately 5 mL of bloody fluid which was sent for culture analysis. The air component in the gallbladder fossa was decompressed after drain placement. IMPRESSION: 1. CT-guided placement of 10 French percutaneous drainage catheter within the gallbladder fossa. Approximately 5 mL of bloody fluid was aspirated and sent for culture analysis. 2. Initial CT demonstrated a change in appearance since the prior CT with now predominantly air present in the gallbladder fossa with a small air-fluid level. This air communicates with air in the cystic duct stump and biliary tree consistent with a fistula to the biliary tree. The previously placed endoscopic covered metallic biliary stent has also clearly migrated distally and is mostly in the duodenum with the proximal portion just extending into the CBD. The metallic stent does not cover the cystic duct stump insertion. Electronically Signed   By: Aletta Edouard M.D.   On: 05/23/2020 17:19     Scheduled Meds: . brexpiprazole  2 mg Oral QHS  . ezetimibe  10 mg Oral Daily  . feeding supplement  237 mL Oral BID BM  . insulin aspart  0-9 Units Subcutaneous TID WC  . QUEtiapine  800 mg Oral QHS  . saccharomyces boulardii  250 mg Oral BID  . simvastatin  10 mg Oral QAC breakfast  . sodium chloride flush  5 mL Intracatheter Q8H  . topiramate  200 mg Oral BID  . vortioxetine HBr  20 mg Oral QHS  . ziprasidone  20 mg Oral QHS   Continuous Infusions: . sodium chloride 100 mL/hr at 05/24/20 0531  . lactated ringers Stopped (05/23/20 2143)  . piperacillin-tazobactam 3.375 g (05/24/20 0934)     LOS: 3 days    Time spent: 50  Nita Sells, MD [facility] To contact the  attending provider between 7A-7P or the covering provider during after hours 7P-7A, please log into the web site [email or web address] and access using universal [place] password for that web site. If you do not have the password, please call the hospital operator.  05/24/2020, 2:13 PM

## 2020-05-25 ENCOUNTER — Inpatient Hospital Stay (HOSPITAL_COMMUNITY): Payer: Medicare Other

## 2020-05-25 DIAGNOSIS — K839 Disease of biliary tract, unspecified: Secondary | ICD-10-CM | POA: Diagnosis not present

## 2020-05-25 DIAGNOSIS — K668 Other specified disorders of peritoneum: Secondary | ICD-10-CM | POA: Diagnosis not present

## 2020-05-25 LAB — CBC WITH DIFFERENTIAL/PLATELET
Abs Immature Granulocytes: 0.07 10*3/uL (ref 0.00–0.07)
Basophils Absolute: 0 10*3/uL (ref 0.0–0.1)
Basophils Relative: 1 %
Eosinophils Absolute: 0.3 10*3/uL (ref 0.0–0.5)
Eosinophils Relative: 4 %
HCT: 32.2 % — ABNORMAL LOW (ref 36.0–46.0)
Hemoglobin: 9.3 g/dL — ABNORMAL LOW (ref 12.0–15.0)
Immature Granulocytes: 1 %
Lymphocytes Relative: 21 %
Lymphs Abs: 1.4 10*3/uL (ref 0.7–4.0)
MCH: 25.4 pg — ABNORMAL LOW (ref 26.0–34.0)
MCHC: 28.9 g/dL — ABNORMAL LOW (ref 30.0–36.0)
MCV: 88 fL (ref 80.0–100.0)
Monocytes Absolute: 0.5 10*3/uL (ref 0.1–1.0)
Monocytes Relative: 7 %
Neutro Abs: 4.6 10*3/uL (ref 1.7–7.7)
Neutrophils Relative %: 66 %
Platelets: 224 10*3/uL (ref 150–400)
RBC: 3.66 MIL/uL — ABNORMAL LOW (ref 3.87–5.11)
RDW: 14.9 % (ref 11.5–15.5)
WBC: 6.9 10*3/uL (ref 4.0–10.5)
nRBC: 0 % (ref 0.0–0.2)

## 2020-05-25 LAB — COMPREHENSIVE METABOLIC PANEL
ALT: 34 U/L (ref 0–44)
AST: 47 U/L — ABNORMAL HIGH (ref 15–41)
Albumin: 3.1 g/dL — ABNORMAL LOW (ref 3.5–5.0)
Alkaline Phosphatase: 179 U/L — ABNORMAL HIGH (ref 38–126)
Anion gap: 10 (ref 5–15)
BUN: 14 mg/dL (ref 6–20)
CO2: 24 mmol/L (ref 22–32)
Calcium: 8.5 mg/dL — ABNORMAL LOW (ref 8.9–10.3)
Chloride: 104 mmol/L (ref 98–111)
Creatinine, Ser: 0.55 mg/dL (ref 0.44–1.00)
GFR, Estimated: >60 mL/min (ref >60)
Glucose, Bld: 102 mg/dL — ABNORMAL HIGH (ref 70–99)
Potassium: 4.2 mmol/L (ref 3.5–5.1)
Sodium: 138 mmol/L (ref 135–145)
Total Bilirubin: 0.6 mg/dL (ref 0.3–1.2)
Total Protein: 6.5 g/dL (ref 6.5–8.1)

## 2020-05-25 LAB — GLUCOSE, CAPILLARY
Glucose-Capillary: 103 mg/dL — ABNORMAL HIGH (ref 70–99)
Glucose-Capillary: 111 mg/dL — ABNORMAL HIGH (ref 70–99)
Glucose-Capillary: 92 mg/dL (ref 70–99)
Glucose-Capillary: 94 mg/dL (ref 70–99)
Glucose-Capillary: 67 mg/dL — ABNORMAL LOW (ref 70–99)
Glucose-Capillary: 107 mg/dL — ABNORMAL HIGH (ref 70–99)
Glucose-Capillary: 176 mg/dL — ABNORMAL HIGH (ref 70–99)
Glucose-Capillary: 169 mg/dL — ABNORMAL HIGH (ref 70–99)

## 2020-05-25 MED ORDER — DEXTROSE 50 % IV SOLN
12.5000 g | Freq: Once | INTRAVENOUS | Status: AC
  Administered 2020-05-25: 12.5 g via INTRAVENOUS
  Filled 2020-05-25: qty 50

## 2020-05-25 MED ORDER — POLYETHYLENE GLYCOL 3350 17 G PO PACK
17.0000 g | PACK | Freq: Every day | ORAL | Status: DC
  Administered 2020-05-27 – 2020-05-28 (×2): 17 g via ORAL
  Filled 2020-05-25 (×4): qty 1

## 2020-05-25 MED ORDER — DIAZEPAM 5 MG PO TABS
5.0000 mg | ORAL_TABLET | ORAL | Status: AC
  Administered 2020-05-25: 5 mg via ORAL
  Filled 2020-05-25: qty 1

## 2020-05-25 MED ORDER — DEXTROSE 5 % IV SOLN: INTRAVENOUS | Status: DC

## 2020-05-25 MED ORDER — GADOBUTROL 1 MMOL/ML IV SOLN
7.0000 mL | Freq: Once | INTRAVENOUS | Status: AC | PRN
  Administered 2020-05-25: 7 mL via INTRAVENOUS

## 2020-05-25 MED ORDER — PIPERACILLIN-TAZOBACTAM 3.375 G IVPB 30 MIN
3.3750 g | Freq: Three times a day (TID) | INTRAVENOUS | Status: DC
  Administered 2020-05-25 – 2020-05-26 (×4): 3.375 g via INTRAVENOUS
  Filled 2020-05-25 (×4): qty 50

## 2020-05-25 NOTE — Progress Notes (Addendum)
Progress Note    ASSESSMENT AND PLAN:   H/O acute cholecystitis s/p subtotal cholecystectomy 07/4915 complicated by persistent cystic duct stump leak despite plastic (occluded) and metal biliary stent placement 12/20 s/p gallbladder fossa 10Fr drain placement by IR 05/23/2020.  CT demonstrated focal area in the gallbladder which appeared to communicate with air in the biliary tree ?Choledochal fistula/persistent leak  HIDA did reveal patent SEMS previously  Subhepatic drain-serosanguineous.  No bile  Plan:  -MRCP has been performed.  Awaiting results. -Continue current treatment.   Addendum: -MRCP does not show any residual fluid collection around the catheter tip. -Hepatic cirrhosis.  No ascites. -I did not see the biliary stent -Will discuss with Dr. Kathlene Cote   SUBJECTIVE   Still having abdominal pain. Pretty much the same if not slightly worse. No nausea or vomiting. Has been able to tolerate protein shakes. Had good bowel movements after suppositories.  Husband by the bedside.  OBJECTIVE:     Vital signs in last 24 hours: Temp:  [97.7 F (36.5 C)-98.5 F (36.9 C)] 98.5 F (36.9 C) (01/01 1500) Pulse Rate:  [85-93] 86 (01/01 1500) Resp:  [15-18] 15 (01/01 1500) BP: (112-143)/(57-70) 143/70 (01/01 1500) SpO2:  [90 %-97 %] 95 % (01/01 1500) Last BM Date: 05/25/20 General:   Alert, well-developed female in NAD EENT:  Normal hearing, non icteric sclera, conjunctive pink.  Abdomen:  Soft, tender especially in the right abdomen.  Drain-serosanguineous.  No bile.       Neurologic:  Alert and  oriented x4;  grossly normal neurologically. Psych:  Pleasant, cooperative.  Normal mood and affect.   Intake/Output from previous day: 12/31 0701 - 01/01 0700 In: 1299.8 [P.O.:240; I.V.:1049.8] Out: 831 [Urine:800; Drains:30; Stool:1] Intake/Output this shift: No intake/output data recorded.  Lab Results: Recent Labs    05/23/20 0546 05/24/20 0605  05/25/20 0541  WBC 6.0 6.8 6.9  HGB 10.1* 9.5* 9.3*  HCT 34.6* 33.8* 32.2*  PLT 222 228 224   BMET Recent Labs    05/23/20 0546 05/24/20 0605 05/25/20 0541  NA 139 138 138  K 4.2 4.1 4.2  CL 106 106 104  CO2 21* 20* 24  GLUCOSE 142* 105* 102*  BUN 20 14 14   CREATININE 0.72 0.60 0.55  CALCIUM 8.9 8.6* 8.5*   LFT Recent Labs    05/25/20 0541  PROT 6.5  ALBUMIN 3.1*  AST 47*  ALT 34  ALKPHOS 179*  BILITOT 0.6   PT/INR No results for input(s): LABPROT, INR in the last 72 hours. Hepatitis Panel No results for input(s): HEPBSAG, HCVAB, HEPAIGM, HEPBIGM in the last 72 hours.  MR 3D Recon At Scanner  Result Date: 05/25/2020 CLINICAL DATA:  55 year old female with history of right upper quadrant abdominal pain. Prior history of cholecystectomy. Evaluate for possible biliary fistula. EXAM: MRI ABDOMEN WITHOUT AND WITH CONTRAST (INCLUDING MRCP) TECHNIQUE: Multiplanar multisequence MR imaging of the abdomen was performed both before and after the administration of intravenous contrast. Heavily T2-weighted images of the biliary and pancreatic ducts were obtained, and three-dimensional MRCP images were rendered by post processing. CONTRAST:  46m GADAVIST GADOBUTROL 1 MMOL/ML IV SOLN COMPARISON:  No priors. FINDINGS: Lower chest: Linear areas of signal intensity within the lower lobes of the lungs bilaterally, poorly evaluated on today's abdominal MRI, but statistically likely to represent areas of subsegmental atelectasis or scarring. Hepatobiliary: Liver has a slightly shrunken appearance and nodular contour, indicative of underlying cirrhosis. Scattered throughout the liver there are some lace-like  regions of T2 hyperintensity which correspond to areas of progressive delayed enhancement on post gadolinium imaging, indicative of developing regions of fibrosis, most confluent in the right lobe of the liver. No suspicious cystic or solid hepatic lesions. No intra or extrahepatic biliary  ductal dilatation. Status post cholecystectomy. Percutaneous drain with tip terminating in the gallbladder fossa. No definite fluid collection around the tip of the drainage catheter. Pancreas: No pancreatic mass. No pancreatic ductal dilatation. No pancreatic or peripancreatic fluid collections or inflammatory changes. Spleen:  Unremarkable. Adrenals/Urinary Tract: Bilateral kidneys and bilateral adrenal glands are normal in appearance. No hydroureteronephrosis in the visualized portions of the abdomen. Stomach/Bowel: Visualized portions are unremarkable. Vascular/Lymphatic: No aneurysm identified in the visualized abdominal vasculature. No lymphadenopathy noted in the abdomen. Other: No significant volume of ascites noted in the visualized portions of the peritoneal cavity. Musculoskeletal: No aggressive appearing osseous lesions are noted in the visualized portions of the abdomen. IMPRESSION: 1. Status post cholecystectomy with percutaneous drainage catheter in the gallbladder fossa, without residual fluid collection around the catheter tip. 2. Hepatic cirrhosis with developing hepatic fibrosis, most confluent in the right lobe of the liver. Electronically Signed   By: Vinnie Langton M.D.   On: 05/25/2020 15:02   MR ABDOMEN MRCP W WO CONTAST  Result Date: 05/25/2020 CLINICAL DATA:  55 year old female with history of right upper quadrant abdominal pain. Prior history of cholecystectomy. Evaluate for possible biliary fistula. EXAM: MRI ABDOMEN WITHOUT AND WITH CONTRAST (INCLUDING MRCP) TECHNIQUE: Multiplanar multisequence MR imaging of the abdomen was performed both before and after the administration of intravenous contrast. Heavily T2-weighted images of the biliary and pancreatic ducts were obtained, and three-dimensional MRCP images were rendered by post processing. CONTRAST:  44m GADAVIST GADOBUTROL 1 MMOL/ML IV SOLN COMPARISON:  No priors. FINDINGS: Lower chest: Linear areas of signal intensity within the  lower lobes of the lungs bilaterally, poorly evaluated on today's abdominal MRI, but statistically likely to represent areas of subsegmental atelectasis or scarring. Hepatobiliary: Liver has a slightly shrunken appearance and nodular contour, indicative of underlying cirrhosis. Scattered throughout the liver there are some lace-like regions of T2 hyperintensity which correspond to areas of progressive delayed enhancement on post gadolinium imaging, indicative of developing regions of fibrosis, most confluent in the right lobe of the liver. No suspicious cystic or solid hepatic lesions. No intra or extrahepatic biliary ductal dilatation. Status post cholecystectomy. Percutaneous drain with tip terminating in the gallbladder fossa. No definite fluid collection around the tip of the drainage catheter. Pancreas: No pancreatic mass. No pancreatic ductal dilatation. No pancreatic or peripancreatic fluid collections or inflammatory changes. Spleen:  Unremarkable. Adrenals/Urinary Tract: Bilateral kidneys and bilateral adrenal glands are normal in appearance. No hydroureteronephrosis in the visualized portions of the abdomen. Stomach/Bowel: Visualized portions are unremarkable. Vascular/Lymphatic: No aneurysm identified in the visualized abdominal vasculature. No lymphadenopathy noted in the abdomen. Other: No significant volume of ascites noted in the visualized portions of the peritoneal cavity. Musculoskeletal: No aggressive appearing osseous lesions are noted in the visualized portions of the abdomen. IMPRESSION: 1. Status post cholecystectomy with percutaneous drainage catheter in the gallbladder fossa, without residual fluid collection around the catheter tip. 2. Hepatic cirrhosis with developing hepatic fibrosis, most confluent in the right lobe of the liver. Electronically Signed   By: DVinnie LangtonM.D.   On: 05/25/2020 15:02     Principal Problem:   Biloma Active Problems:   Insulin dependent type 2  diabetes mellitus ([place]   Hyperlipidemia associated with type 2  diabetes mellitus ([place])   Hypertension associated with diabetes ([place])   Other cirrhosis of liver ([place])   Bile leak   Abdominal pain   Malnutrition of moderate degree     LOS: 4 days     Carmell Austria, MD 05/25/2020, 4:55 PM [facility] [phone]

## 2020-05-25 NOTE — Progress Notes (Signed)
PROGRESS NOTE   Kim Hicks  TXH:[id number] DOB: [birth year] DOA: 05/20/2020 PCP: [name], FNP  Brief Narrative:   55 year old white female from home HTN, HLD, depression with psychosis NIDDM complicated by diabetic neuropathy severe cirrhosis of liver Lap chole 08/04/2019-difficult case-backwall of gallbladder left in place-found to have a contained bile leak status post ERCP sphincterotomy and stent 08/07/2019 followed by [facility]-was discharged on Augmentin  Subsequently 11/07/2019 underwent ERCP stent was exchanged in common bile duct with a new stent upsized 10 French plastic stent  In the interim was admitted at [facility] 03/11/2020 found to have E. coli in urine blood cultures 1 out of 4 bottles E. coli was treated with Levaquin Found to have RIb # 8th rib  Underwent a final repeat ERCP 12/20 with persistent bile leak clogged stent removed metal stent placed Developed on 12/21 progressive worsening R LQ pain diaphoresis chills-seen at GI clinic 12/27 and she was sent to [facility] long ED for imaging and further work-up  CT abdomen pelvis = multilocular fluid collection 4.6 X2.6 biloma/postprocedural seroma-GI consulted Zosyn started IR consulted   Assessment & Plan:   Principal Problem:   Biloma Active Problems:   Insulin dependent type 2 diabetes mellitus ([place])   Hyperlipidemia associated with type 2 diabetes mellitus ([place])   Hypertension associated with diabetes ([place])   Other cirrhosis of liver ([place])   Bile leak   Abdominal pain   Malnutrition of moderate degree  Biloma versus postop seroma status post lap cholecystectomy Continue OxyIR 10 every 6 as needed, morphine 2 to 4 mg every 2 as needed in addition to Toradol 30 every 6 as needed for moderate pain--at baseline does not take medications for pain --pain is marginally better with passage of stool but not resolved Percutaneous drain 10 French placed 12/30 and CT demonstrating choledocho fistula? Persistent  leak Await MRCP 05/25/2020 check on possible fistula and stent placement Further planning per GI/IR I try to explain in layman's terms to patient today what is going on with her Constipation for several days-seems chronic Passing stool with mag citrate Follow-up with bowel regimen MiraLAX scheduled  daily but hold if diarrhea Insulin-dependent DM TY 2 with severe neuropathy Currently 94-111 Continue current sliding scale management  Holding Basaglar 38 Metformin 1000 at bedtime and Synjardy for now Cirrhosis Secondary to NAFLD?-Outpatient follow-up with GI HTN  continue lisinopril 20, prazosin 4 Outpatient further adjustment Anemia chronic disease  Currently stable Depression anxiety  Continue Topamax 200 twice daily, vortioxetine 20 Seroquel iii/day Rexulti 2 at bedtime  DVT prophylaxis: SCD Code Status: Full Family Communication: Discussed with husband on 12/31 Disposition:  Status is: Inpatient  Remains inpatient appropriate because:Hemodynamically unstable and Ongoing active pain requiring inpatient pain management   Dispo: The patient is from: Home              Anticipated d/c is to: Home              Anticipated d/c date is: 3 days              Patient currently is not medically stable to d/c.  Consultants:   GI  IR  Procedures: None  Antimicrobials: None   Subjective:  Coherent awake alert passing some stool now but still having abdominal pain still nauseous on clear liquids no chest pain no fever Passing gas location of pain is central abdomen  Objective: Vitals:   05/24/20 0442 05/24/20 1705 05/24/20 2039 05/25/20 0654  BP: 112/61 116/69 121/63 [name])  112/57  Pulse: (!) 101 93 85 92  Resp: 16 17 18 17   Temp: 97.9 F (36.6 C) 98.4 F (36.9 C) 98.2 F (36.8 C) 97.7 F (36.5 C)  TempSrc: Oral Oral Oral Oral  SpO2: 95% 94% 97% 90%  Weight:      Height:        Intake/Output Summary (Last 24 hours) at 05/25/2020 1013 Last data filed at 05/25/2020  7341 Gross per 24 hour  Intake 1299.78 ml  Output 831 ml  Net 468.78 ml   Filed Weights   05/21/20 1815  Weight: 72.4 kg    Examination:  EOMI NCAT no icterus chest clear no added sound rales rhonchi Abdomen obese distended no rebound no guarding-tender in epigastrium No lower extremity edema Neurologically intact moving 4 limbs equally coherent  Data Reviewed: I have personally reviewed following labs and imaging studies Sodium 138 potassium 4.2 BUNs/creatinine 25/0.6---->20/0.7-->14/0.6-->14/0.5 Alk phos 244-->216-->215-->1867-->179 AST/ALT = 44/41-->53/38-->50/37-->50/35-->47/34  White count 6.9 hemoglobin 9.3    Radiology Studies: US Abdomen Limited  Result Date: 05/23/2020 CLINICAL DATA:  Gallbladder fossa fluid collection after prior cholecystectomy. Assessment for ultrasound-guided aspiration. EXAM: ULTRASOUND ABDOMEN LIMITED RIGHT UPPER QUADRANT COMPARISON:  CT of the abdomen on 05/20/2020 FINDINGS: The focal fluid noted in the gallbladder fossa by recent CT is not definitively visualized by ultrasound. Shadowing is identified from clips in the gallbladder fossa. No biliary ductal dilatation identified. No perihepatic free fluid seen. IMPRESSION: Gallbladder fossa fluid is not visualized by ultrasound. The patient was transported to CT for imaging for possible CT-guided aspiration. Electronically Signed   By: Aletta Edouard M.D.   On: 05/23/2020 15:04   CT IMAGE GUIDED FLUID DRAIN BY CATHETER  Result Date: 05/23/2020 CLINICAL DATA:  Status post cholecystectomy in March which was complicated by a bile leak treated with placement of an endoscopic biliary stent. The stent has been exchanged and eventually replaced for a covered self expanding metallic stent on 93/79/0240. The patient has had persistent pain and CT on 05/20/2020 demonstrates a fluid collection in the gallbladder fossa. Nuclear medicine study on 05/21/2020 did not demonstrate an active bile leak. Due to  persistent severe pain, the patient now presents for aspiration and possible catheter drainage of the gallbladder fossa fluid collection. EXAM: CT GUIDED PERCUTANEOUS CATHETER DRAINAGE OF GALLBLADDER FOSSA ABSCESS ANESTHESIA/SEDATION: 3.0 mg IV Versed 100 mcg IV Fentanyl Total Moderate Sedation Time:  12 minutes The patient's level of consciousness and physiologic status were continuously monitored during the procedure by Radiology nursing. PROCEDURE: The procedure, risks, benefits, and alternatives were explained to the patient. Questions regarding the procedure were encouraged and answered. The patient understands and consents to the procedure. A time out was performed prior to initiating the procedure. CT was performed of the abdomen in a supine position. The right abdominal wall was prepped with chlorhexidine in a sterile fashion, and a sterile drape was applied covering the operative field. A sterile gown and sterile gloves were used for the procedure. Local anesthesia was provided with 1% Lidocaine. Under CT guidance, an 18 gauge trocar needle was advanced into the gallbladder fossa. A guidewire was advanced and the needle removed. The tract was dilated and a 10 French percutaneous drainage catheter advanced over the wire. Drain position was confirmed by CT. Fluid was aspirated from the drain and sent for culture analysis. Final drain positioning was confirmed by CT. The drainage catheter was connected to a suction bulb and secured at the skin with a Prolene retention suture and StatLock device.  COMPLICATIONS: None FINDINGS: Preliminary CT now shows a change in appearance at the level of the gallbladder fossa with predominantly air present in the gallbladder fossa and a small dependent air-fluid level. This air appears to communicate with air in the intrahepatic biliary tree and air in the common bile duct as well as what appears to be air in the cystic duct stump. 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 Of note, and also apparent on the recent CT of 05/20/2020, is clear distal migration of the recently placed metallic biliary stent which currently barely extends into the distal CBD and mostly extends into the duodenal lumen. The metallic biliary stent does not cover the cystic duct stump insertion. After placement of the drainage catheter in the gallbladder fossa, aspiration yielded approximately 5 mL of bloody fluid which was sent for culture analysis. The air component in the gallbladder fossa was decompressed after drain placement. IMPRESSION: 1. CT-guided placement of 10 French percutaneous drainage catheter within the gallbladder fossa. Approximately 5 mL of bloody fluid was aspirated and sent for culture analysis. 2. Initial CT demonstrated a change in appearance since the prior CT with now predominantly air present in the gallbladder fossa with a small air-fluid level. This air communicates with air in the cystic duct stump and biliary tree consistent with a fistula to the biliary tree. The previously placed endoscopic covered metallic biliary stent has also clearly migrated distally and is mostly in the duodenum with the proximal portion just extending into the CBD. The metallic stent does not cover the cystic duct stump insertion. Electronically Signed   By: Aletta Edouard M.D.   On: 05/23/2020 17:19     Scheduled Meds: . brexpiprazole  2 mg Oral QHS  . ezetimibe  10 mg Oral Daily  . feeding supplement  237 mL Oral BID BM  . insulin aspart  0-9 Units Subcutaneous TID WC  . QUEtiapine  800 mg Oral QHS  . saccharomyces boulardii  250 mg Oral BID  . simvastatin  10 mg Oral QAC breakfast  . sodium chloride flush  5 mL Intracatheter Q8H  . topiramate  200 mg Oral BID  . vortioxetine HBr  20 mg Oral QHS  . ziprasidone  20 mg Oral QHS   Continuous Infusions: . sodium chloride 100 mL/hr at 05/25/20 0811  . piperacillin-tazobactam        LOS: 4 days    Time spent:  [place], MD [facility] To contact the attending provider between 7A-7P or the covering provider during after hours 7P-7A, please log into the web site [email or web address] and access using universal [place] password for that web site. If you do not have the password, please call the hospital operator.  05/25/2020, 10:13 AM

## 2020-05-25 NOTE — Progress Notes (Signed)
Hypoglycemic Event  CBG: 67  Treatment: 12.5 mg of D5 and fluids changed to D5 at 100 ml /hr  Symptoms: Patient reported being cold   Follow-up CBG: Time:12:50 CBG Result: 107  Possible Reasons for Event: Patient is NPO for MRI  Comments/MD notified:Dr. Orrin Brigham A Zenovia Justman

## 2020-05-26 DIAGNOSIS — K839 Disease of biliary tract, unspecified: Secondary | ICD-10-CM | POA: Diagnosis not present

## 2020-05-26 DIAGNOSIS — K668 Other specified disorders of peritoneum: Secondary | ICD-10-CM | POA: Diagnosis not present

## 2020-05-26 LAB — GLUCOSE, CAPILLARY
Glucose-Capillary: 130 mg/dL — ABNORMAL HIGH (ref 70–99)
Glucose-Capillary: 152 mg/dL — ABNORMAL HIGH (ref 70–99)
Glucose-Capillary: 169 mg/dL — ABNORMAL HIGH (ref 70–99)
Glucose-Capillary: 199 mg/dL — ABNORMAL HIGH (ref 70–99)
Glucose-Capillary: 291 mg/dL — ABNORMAL HIGH (ref 70–99)
Glucose-Capillary: 318 mg/dL — ABNORMAL HIGH (ref 70–99)

## 2020-05-26 MED ORDER — PANTOPRAZOLE SODIUM 40 MG PO TBEC
40.0000 mg | DELAYED_RELEASE_TABLET | Freq: Every day | ORAL | Status: DC
  Administered 2020-05-26 – 2020-05-30 (×5): 40 mg via ORAL
  Filled 2020-05-26 (×5): qty 1

## 2020-05-26 MED ORDER — DEXTROSE 5 % IV SOLN: INTRAVENOUS | Status: DC

## 2020-05-26 MED ORDER — SODIUM CHLORIDE 0.9 % IV SOLN
3.0000 g | Freq: Four times a day (QID) | INTRAVENOUS | Status: DC
  Filled 2020-05-26: qty 8

## 2020-05-26 MED ORDER — PREGABALIN 50 MG PO CAPS
100.0000 mg | ORAL_CAPSULE | Freq: Three times a day (TID) | ORAL | Status: DC
  Administered 2020-05-26 – 2020-05-30 (×12): 100 mg via ORAL
  Filled 2020-05-26 (×12): qty 2

## 2020-05-26 MED ORDER — SODIUM CHLORIDE 0.9 % IV SOLN
3.0000 g | Freq: Four times a day (QID) | INTRAVENOUS | Status: DC
  Administered 2020-05-26 – 2020-05-29 (×12): 3 g via INTRAVENOUS
  Filled 2020-05-26 (×7): qty 3
  Filled 2020-05-26: qty 8
  Filled 2020-05-26 (×5): qty 3

## 2020-05-26 MED ORDER — MORPHINE SULFATE (PF) 2 MG/ML IV SOLN
2.0000 mg | Freq: Once | INTRAVENOUS | Status: AC
  Administered 2020-05-26: 2 mg via INTRAVENOUS
  Filled 2020-05-26: qty 1

## 2020-05-26 NOTE — Progress Notes (Signed)
Dsg changed done at drain site. Pt tolerated with no problems.

## 2020-05-26 NOTE — Progress Notes (Addendum)
Progress Note    ASSESSMENT AND PLAN:   H/Oacute cholecystitiss/psubtotalcholecystectomy3/2021complicated by persistent cystic ductstump leak despite plastic (occluded) and metal biliary stent placement 12/20,s/p subhepatic IR drain placement12/30/2021, now with biliary drainage.   I have discussed with Dr. Kathlene Cote who was kind enough to review previous studies including last ERCP, MRCP.  Looks like the covered metal stent has migrated into the duodenum. Only 1/3rd is in the distal bile duct.  The rest of the stent is in duodenum and makes an acute angle.  It is definitely not across the area of cystic stump leak.  Also now given IR drain has bile, it is suggestive of persistent leak.  Plan: -ERCP with Dr. Fuller Plan tomorrow for possible removal of the existing stent and reinserting new stent across the site of the leak.  -Continue IV antibiotics. -Recheck CBC, CMP in a.m.    I have discussed risks and benefits with the patient and patient's husband in detail.  I have also told them that Dr. Fuller Plan will review the imaging studies and then make final decision.  We will have a spot for her to get ERCP done tomorrow.   SUBJECTIVE   Now the drain is draining out green bile Still with abdominal pain No nausea or vomiting No fever or chills.    OBJECTIVE:     Vital signs in last 24 hours: Temp:  [98.3 F (36.8 C)-99.8 F (37.7 C)] 98.3 F (36.8 C) (01/02 1431) Pulse Rate:  [86-91] 87 (01/02 1431) Resp:  [15-20] 15 (01/02 1431) BP: (117-143)/(65-74) 117/67 (01/02 1431) SpO2:  [91 %-96 %] 96 % (01/02 1431) Last BM Date: 05/25/20 General:   Alert, well-developed female in NAD EENT:  Normal hearing, non icteric sclera, conjunctive pink.  Heart:  Regular rate and rhythm; no murmur.  No lower extremity edema   Pulm: Normal respiratory effort, lungs CTA bilaterally without wheezes or crackles. Abdomen:  Soft, nondistended, still tender.  Normal bowel sounds,.   IR  drain with bile.    Neurologic:  Alert and  oriented x4;  grossly normal neurologically. Psych:  Pleasant, cooperative.  Normal mood and affect.   Intake/Output from previous day: 01/01 0701 - 01/02 0700 In: 726.3 [I.V.:685.4; IV Piggyback:35.9] Out: 40 [Drains:40] Intake/Output this shift: Total I/O In: -  Out: 30 [Drains:30]  Lab Results: Recent Labs    05/24/20 0605 05/25/20 0541  WBC 6.8 6.9  HGB 9.5* 9.3*  HCT 33.8* 32.2*  PLT 228 224   BMET Recent Labs    05/24/20 0605 05/25/20 0541  NA 138 138  K 4.1 4.2  CL 106 104  CO2 20* 24  GLUCOSE 105* 102*  BUN 14 14  CREATININE 0.60 0.55  CALCIUM 8.6* 8.5*   LFT Recent Labs    05/25/20 0541  PROT 6.5  ALBUMIN 3.1*  AST 47*  ALT 34  ALKPHOS 179*  BILITOT 0.6   PT/INR No results for input(s): LABPROT, INR in the last 72 hours. Hepatitis Panel No results for input(s): HEPBSAG, HCVAB, HEPAIGM, HEPBIGM in the last 72 hours.  MR 3D Recon At Scanner  Result Date: 05/25/2020 CLINICAL DATA:  55 year old female with history of right upper quadrant abdominal pain. Prior history of cholecystectomy. Evaluate for possible biliary fistula. EXAM: MRI ABDOMEN WITHOUT AND WITH CONTRAST (INCLUDING MRCP) TECHNIQUE: Multiplanar multisequence MR imaging of the abdomen was performed both before and after the administration of intravenous contrast. Heavily T2-weighted images of the biliary and pancreatic ducts were obtained, and three-dimensional  MRCP images were rendered by post processing. CONTRAST:  79m GADAVIST GADOBUTROL 1 MMOL/ML IV SOLN COMPARISON:  No priors. FINDINGS: Lower chest: Linear areas of signal intensity within the lower lobes of the lungs bilaterally, poorly evaluated on today's abdominal MRI, but statistically likely to represent areas of subsegmental atelectasis or scarring. Hepatobiliary: Liver has a slightly shrunken appearance and nodular contour, indicative of underlying cirrhosis. Scattered throughout the liver  there are some lace-like regions of T2 hyperintensity which correspond to areas of progressive delayed enhancement on post gadolinium imaging, indicative of developing regions of fibrosis, most confluent in the right lobe of the liver. No suspicious cystic or solid hepatic lesions. No intra or extrahepatic biliary ductal dilatation. Status post cholecystectomy. Percutaneous drain with tip terminating in the gallbladder fossa. No definite fluid collection around the tip of the drainage catheter. Pancreas: No pancreatic mass. No pancreatic ductal dilatation. No pancreatic or peripancreatic fluid collections or inflammatory changes. Spleen:  Unremarkable. Adrenals/Urinary Tract: Bilateral kidneys and bilateral adrenal glands are normal in appearance. No hydroureteronephrosis in the visualized portions of the abdomen. Stomach/Bowel: Visualized portions are unremarkable. Vascular/Lymphatic: No aneurysm identified in the visualized abdominal vasculature. No lymphadenopathy noted in the abdomen. Other: No significant volume of ascites noted in the visualized portions of the peritoneal cavity. Musculoskeletal: No aggressive appearing osseous lesions are noted in the visualized portions of the abdomen. IMPRESSION: 1. Status post cholecystectomy with percutaneous drainage catheter in the gallbladder fossa, without residual fluid collection around the catheter tip. 2. Hepatic cirrhosis with developing hepatic fibrosis, most confluent in the right lobe of the liver. Electronically Signed   By: DVinnie LangtonM.D.   On: 05/25/2020 15:02   MR ABDOMEN MRCP W WO CONTAST  Result Date: 05/25/2020 CLINICAL DATA:  55year old female with history of right upper quadrant abdominal pain. Prior history of cholecystectomy. Evaluate for possible biliary fistula. EXAM: MRI ABDOMEN WITHOUT AND WITH CONTRAST (INCLUDING MRCP) TECHNIQUE: Multiplanar multisequence MR imaging of the abdomen was performed both before and after the  administration of intravenous contrast. Heavily T2-weighted images of the biliary and pancreatic ducts were obtained, and three-dimensional MRCP images were rendered by post processing. CONTRAST:  744mGADAVIST GADOBUTROL 1 MMOL/ML IV SOLN COMPARISON:  No priors. FINDINGS: Lower chest: Linear areas of signal intensity within the lower lobes of the lungs bilaterally, poorly evaluated on today's abdominal MRI, but statistically likely to represent areas of subsegmental atelectasis or scarring. Hepatobiliary: Liver has a slightly shrunken appearance and nodular contour, indicative of underlying cirrhosis. Scattered throughout the liver there are some lace-like regions of T2 hyperintensity which correspond to areas of progressive delayed enhancement on post gadolinium imaging, indicative of developing regions of fibrosis, most confluent in the right lobe of the liver. No suspicious cystic or solid hepatic lesions. No intra or extrahepatic biliary ductal dilatation. Status post cholecystectomy. Percutaneous drain with tip terminating in the gallbladder fossa. No definite fluid collection around the tip of the drainage catheter. Pancreas: No pancreatic mass. No pancreatic ductal dilatation. No pancreatic or peripancreatic fluid collections or inflammatory changes. Spleen:  Unremarkable. Adrenals/Urinary Tract: Bilateral kidneys and bilateral adrenal glands are normal in appearance. No hydroureteronephrosis in the visualized portions of the abdomen. Stomach/Bowel: Visualized portions are unremarkable. Vascular/Lymphatic: No aneurysm identified in the visualized abdominal vasculature. No lymphadenopathy noted in the abdomen. Other: No significant volume of ascites noted in the visualized portions of the peritoneal cavity. Musculoskeletal: No aggressive appearing osseous lesions are noted in the visualized portions of the abdomen. IMPRESSION: 1. Status post  cholecystectomy with percutaneous drainage catheter in the  gallbladder fossa, without residual fluid collection around the catheter tip. 2. Hepatic cirrhosis with developing hepatic fibrosis, most confluent in the right lobe of the liver. Electronically Signed   By: Vinnie Langton M.D.   On: 05/25/2020 15:02     Principal Problem:   Biloma Active Problems:   Insulin dependent type 2 diabetes mellitus ([place])   Hyperlipidemia associated with type 2 diabetes mellitus ([place])   Hypertension associated with diabetes ([place])   Other cirrhosis of liver ([place])   Bile leak   Abdominal pain   Malnutrition of moderate degree     LOS: 5 days     Carmell Austria, MD 05/26/2020, 2:55 PM [facility] [phone]

## 2020-05-26 NOTE — Progress Notes (Signed)
Reviewed and agree with management plan.  Vlad Mayberry T. Dahlton Hinde, MD FACG [facility]  

## 2020-05-26 NOTE — H&P (View-Only) (Signed)
Progress Note    ASSESSMENT AND PLAN:   H/Oacute cholecystitiss/psubtotalcholecystectomy3/2021complicated by persistent cystic ductstump leak despite plastic (occluded) and metal biliary stent placement 12/20,s/p subhepatic IR drain placement12/30/2021, now with biliary drainage.   I have discussed with Dr. Kathlene Cote who was kind enough to review previous studies including last ERCP, MRCP.  Looks like the covered metal stent has migrated into the duodenum. Only 1/3rd is in the distal bile duct.  The rest of the stent is in duodenum and makes an acute angle.  It is definitely not across the area of cystic stump leak.  Also now given IR drain has bile, it is suggestive of persistent leak.  Plan: -ERCP with Dr. Fuller Plan tomorrow for possible removal of the existing stent and reinserting new stent across the site of the leak.  -Continue IV antibiotics. -Recheck CBC, CMP in a.m.    I have discussed risks and benefits with the patient and patient's husband in detail.  I have also told them that Dr. Fuller Plan will review the imaging studies and then make final decision.  We will have a spot for her to get ERCP done tomorrow.   SUBJECTIVE   Now the drain is draining out green bile Still with abdominal pain No nausea or vomiting No fever or chills.    OBJECTIVE:     Vital signs in last 24 hours: Temp:  [98.3 F (36.8 C)-99.8 F (37.7 C)] 98.3 F (36.8 C) (01/02 1431) Pulse Rate:  [86-91] 87 (01/02 1431) Resp:  [15-20] 15 (01/02 1431) BP: (117-143)/(65-74) 117/67 (01/02 1431) SpO2:  [91 %-96 %] 96 % (01/02 1431) Last BM Date: 05/25/20 General:   Alert, well-developed female in NAD EENT:  Normal hearing, non icteric sclera, conjunctive pink.  Heart:  Regular rate and rhythm; no murmur.  No lower extremity edema   Pulm: Normal respiratory effort, lungs CTA bilaterally without wheezes or crackles. Abdomen:  Soft, nondistended, still tender.  Normal bowel sounds,.   IR  drain with bile.    Neurologic:  Alert and  oriented x4;  grossly normal neurologically. Psych:  Pleasant, cooperative.  Normal mood and affect.   Intake/Output from previous day: 01/01 0701 - 01/02 0700 In: 726.3 [I.V.:685.4; IV Piggyback:35.9] Out: 40 [Drains:40] Intake/Output this shift: Total I/O In: -  Out: 30 [Drains:30]  Lab Results: Recent Labs    05/24/20 0605 05/25/20 0541  WBC 6.8 6.9  HGB 9.5* 9.3*  HCT 33.8* 32.2*  PLT 228 224   BMET Recent Labs    05/24/20 0605 05/25/20 0541  NA 138 138  K 4.1 4.2  CL 106 104  CO2 20* 24  GLUCOSE 105* 102*  BUN 14 14  CREATININE 0.60 0.55  CALCIUM 8.6* 8.5*   LFT Recent Labs    05/25/20 0541  PROT 6.5  ALBUMIN 3.1*  AST 47*  ALT 34  ALKPHOS 179*  BILITOT 0.6   PT/INR No results for input(s): LABPROT, INR in the last 72 hours. Hepatitis Panel No results for input(s): HEPBSAG, HCVAB, HEPAIGM, HEPBIGM in the last 72 hours.  MR 3D Recon At Scanner  Result Date: 05/25/2020 CLINICAL DATA:  55 year old female with history of right upper quadrant abdominal pain. Prior history of cholecystectomy. Evaluate for possible biliary fistula. EXAM: MRI ABDOMEN WITHOUT AND WITH CONTRAST (INCLUDING MRCP) TECHNIQUE: Multiplanar multisequence MR imaging of the abdomen was performed both before and after the administration of intravenous contrast. Heavily T2-weighted images of the biliary and pancreatic ducts were obtained, and three-dimensional  MRCP images were rendered by post processing. CONTRAST:  35m GADAVIST GADOBUTROL 1 MMOL/ML IV SOLN COMPARISON:  No priors. FINDINGS: Lower chest: Linear areas of signal intensity within the lower lobes of the lungs bilaterally, poorly evaluated on today's abdominal MRI, but statistically likely to represent areas of subsegmental atelectasis or scarring. Hepatobiliary: Liver has a slightly shrunken appearance and nodular contour, indicative of underlying cirrhosis. Scattered throughout the liver  there are some lace-like regions of T2 hyperintensity which correspond to areas of progressive delayed enhancement on post gadolinium imaging, indicative of developing regions of fibrosis, most confluent in the right lobe of the liver. No suspicious cystic or solid hepatic lesions. No intra or extrahepatic biliary ductal dilatation. Status post cholecystectomy. Percutaneous drain with tip terminating in the gallbladder fossa. No definite fluid collection around the tip of the drainage catheter. Pancreas: No pancreatic mass. No pancreatic ductal dilatation. No pancreatic or peripancreatic fluid collections or inflammatory changes. Spleen:  Unremarkable. Adrenals/Urinary Tract: Bilateral kidneys and bilateral adrenal glands are normal in appearance. No hydroureteronephrosis in the visualized portions of the abdomen. Stomach/Bowel: Visualized portions are unremarkable. Vascular/Lymphatic: No aneurysm identified in the visualized abdominal vasculature. No lymphadenopathy noted in the abdomen. Other: No significant volume of ascites noted in the visualized portions of the peritoneal cavity. Musculoskeletal: No aggressive appearing osseous lesions are noted in the visualized portions of the abdomen. IMPRESSION: 1. Status post cholecystectomy with percutaneous drainage catheter in the gallbladder fossa, without residual fluid collection around the catheter tip. 2. Hepatic cirrhosis with developing hepatic fibrosis, most confluent in the right lobe of the liver. Electronically Signed   By: DVinnie LangtonM.D.   On: 05/25/2020 15:02   MR ABDOMEN MRCP W WO CONTAST  Result Date: 05/25/2020 CLINICAL DATA:  55year old female with history of right upper quadrant abdominal pain. Prior history of cholecystectomy. Evaluate for possible biliary fistula. EXAM: MRI ABDOMEN WITHOUT AND WITH CONTRAST (INCLUDING MRCP) TECHNIQUE: Multiplanar multisequence MR imaging of the abdomen was performed both before and after the  administration of intravenous contrast. Heavily T2-weighted images of the biliary and pancreatic ducts were obtained, and three-dimensional MRCP images were rendered by post processing. CONTRAST:  770mGADAVIST GADOBUTROL 1 MMOL/ML IV SOLN COMPARISON:  No priors. FINDINGS: Lower chest: Linear areas of signal intensity within the lower lobes of the lungs bilaterally, poorly evaluated on today's abdominal MRI, but statistically likely to represent areas of subsegmental atelectasis or scarring. Hepatobiliary: Liver has a slightly shrunken appearance and nodular contour, indicative of underlying cirrhosis. Scattered throughout the liver there are some lace-like regions of T2 hyperintensity which correspond to areas of progressive delayed enhancement on post gadolinium imaging, indicative of developing regions of fibrosis, most confluent in the right lobe of the liver. No suspicious cystic or solid hepatic lesions. No intra or extrahepatic biliary ductal dilatation. Status post cholecystectomy. Percutaneous drain with tip terminating in the gallbladder fossa. No definite fluid collection around the tip of the drainage catheter. Pancreas: No pancreatic mass. No pancreatic ductal dilatation. No pancreatic or peripancreatic fluid collections or inflammatory changes. Spleen:  Unremarkable. Adrenals/Urinary Tract: Bilateral kidneys and bilateral adrenal glands are normal in appearance. No hydroureteronephrosis in the visualized portions of the abdomen. Stomach/Bowel: Visualized portions are unremarkable. Vascular/Lymphatic: No aneurysm identified in the visualized abdominal vasculature. No lymphadenopathy noted in the abdomen. Other: No significant volume of ascites noted in the visualized portions of the peritoneal cavity. Musculoskeletal: No aggressive appearing osseous lesions are noted in the visualized portions of the abdomen. IMPRESSION: 1. Status post  cholecystectomy with percutaneous drainage catheter in the  gallbladder fossa, without residual fluid collection around the catheter tip. 2. Hepatic cirrhosis with developing hepatic fibrosis, most confluent in the right lobe of the liver. Electronically Signed   By: Vinnie Langton M.D.   On: 05/25/2020 15:02     Principal Problem:   Biloma Active Problems:   Insulin dependent type 2 diabetes mellitus ([place])   Hyperlipidemia associated with type 2 diabetes mellitus ([place])   Hypertension associated with diabetes ([place])   Other cirrhosis of liver ([place])   Bile leak   Abdominal pain   Malnutrition of moderate degree     LOS: 5 days     Carmell Austria, MD 05/26/2020, 2:55 PM  GI [phone]

## 2020-05-26 NOTE — Progress Notes (Signed)
PROGRESS NOTE   Kim CALEF  PNT:[id number] DOB: March 25, 1966 DOA: 05/20/2020 PCP: [name], FNP  Brief Narrative:   55 year old white female from home HTN, HLD, depression with psychosis NIDDM complicated by diabetic neuropathy severe cirrhosis of liver Lap chole 08/04/2019-difficult case-backwall of gallbladder left in place-found to have a contained bile leak status post ERCP sphincterotomy and stent 08/07/2019 followed by [facility]-was discharged on Augmentin  Subsequently 11/07/2019 underwent ERCP stent was exchanged in common bile duct with a new stent upsized 10 French plastic stent  In the interim was admitted at [facility] 03/11/2020 found to have E. coli in urine blood cultures 1 out of 4 bottles E. coli was treated with Levaquin Found to have RIb # 8th rib  Underwent a final repeat ERCP 12/20 with persistent bile leak clogged stent removed metal stent placed Developed on 12/21 progressive worsening R LQ pain diaphoresis chills-seen at GI clinic 12/27 and she was sent to [facility] ED for imaging and further work-up  CT abdomen pelvis = multilocular fluid collection 4.6 X2.6 biloma/postprocedural seroma-GI consulted Zosyn started IR consulted   Assessment & Plan:   Principal Problem:   Biloma Active Problems:   Insulin dependent type 2 diabetes mellitus ([place])   Hyperlipidemia associated with type 2 diabetes mellitus ([place])   Hypertension associated with diabetes ([place])   Other cirrhosis of liver ([place])   Bile leak   Abdominal pain   Malnutrition of moderate degree  Biloma versus postop seroma status post lap cholecystectomy Continue OxyIR 10 every 6 as needed, morphine 2 to 4 mg every 2 as needed in addition to Toradol 30 every 6 as needed for moderate pain--at baseline does not take medications for pain --pain is marginally better with passage of stool but not resolved Did not tolerate graduation to full liquids and had increasing pain so given one-time dose of  morphine today IV and downgrade back to clears Percutaneous drain 10 French placed 12/30 and CT demonstrating choledocho fistula? Persistent leak Await MRCP 05/25/2020 check on possible fistula and stent placement Further planning per GI/IR Constipation for several days-seems chronic Passing stool with mag citrate Follow-up with bowel regimen MiraLAX scheduled  daily but hold if diarrhea Insulin-dependent DM TY 2 with severe neuropathy Currently 1 30-1 69 Continue current sliding scale management  Discontinue off D5 (had some hypoglycemia when was n.p.o. earlier) after procedures are performed on 11/3 Holding Basaglar 38 Metformin 1000 at bedtime and Synjardy for now Cirrhosis Secondary to NAFLD?-Outpatient follow-up with GI HTN  continue lisinopril 20, prazosin 4 Outpatient further adjustment Anemia chronic disease  Currently stable Depression anxiety  Continue Topamax 200 twice daily, vortioxetine 20 Seroquel iii/day Rexulti 2 at bedtime  DVT prophylaxis: SCD Code Status: Full Family Communication: Discussed with husband on 05/26/2020 Seems to understand planning is dependent, ERCP Disposition:  Status is: Inpatient  Remains inpatient appropriate because:Hemodynamically unstable and Ongoing active pain requiring inpatient pain management   Dispo: The patient is from: Home              Anticipated d/c is to: Home              Anticipated d/c date is: 3 days              Patient currently is not medically stable to d/c.  Consultants:   GI  IR  Procedures: None  Antimicrobials: None   Subjective:  Significant abdominal pain no fever no chills Tells me she feels nauseous when she moves feels  nauseous when she smells a drainage and has nausea from the pain However I explained to her probably should be on a clear liquid diet and will give some morphine  Objective: Vitals:   05/25/20 0654 05/25/20 1500 05/25/20 2011 05/26/20 0539  BP: (!) 112/57 (!) 143/70 118/65  135/74  Pulse: 92 86 91 89  Resp: 17 15 20 18   Temp: 97.7 F (36.5 C) 98.5 F (36.9 C) 99.8 F (37.7 C) 98.4 F (36.9 C)  TempSrc: Oral Oral Oral Oral  SpO2: 90% 95% 91% 96%  Weight:      Height:        Intake/Output Summary (Last 24 hours) at 05/26/2020 1116 Last data filed at 05/26/2020 4401 Gross per 24 hour  Intake 726.27 ml  Output 40 ml  Net 686.27 ml   Filed Weights   05/21/20 1815  Weight: 72.4 kg    Examination:  Coherent pleasant no distress EOMI NCAT  tender in right upper quadrant and epigastrium S1-S2 no murmur No lower extremity edema No focal deficits  Data Reviewed: I have personally reviewed following labs and imaging studies No labs today   Radiology Studies: MR 3D Recon At Scanner  Result Date: 05/25/2020 CLINICAL DATA:  55 year old female with history of right upper quadrant abdominal pain. Prior history of cholecystectomy. Evaluate for possible biliary fistula. EXAM: MRI ABDOMEN WITHOUT AND WITH CONTRAST (INCLUDING MRCP) TECHNIQUE: Multiplanar multisequence MR imaging of the abdomen was performed both before and after the administration of intravenous contrast. Heavily T2-weighted images of the biliary and pancreatic ducts were obtained, and three-dimensional MRCP images were rendered by post processing. CONTRAST:  48m GADAVIST GADOBUTROL 1 MMOL/ML IV SOLN COMPARISON:  No priors. FINDINGS: Lower chest: Linear areas of signal intensity within the lower lobes of the lungs bilaterally, poorly evaluated on today's abdominal MRI, but statistically likely to represent areas of subsegmental atelectasis or scarring. Hepatobiliary: Liver has a slightly shrunken appearance and nodular contour, indicative of underlying cirrhosis. Scattered throughout the liver there are some lace-like regions of T2 hyperintensity which correspond to areas of progressive delayed enhancement on post gadolinium imaging, indicative of developing regions of fibrosis, most confluent in the  right lobe of the liver. No suspicious cystic or solid hepatic lesions. No intra or extrahepatic biliary ductal dilatation. Status post cholecystectomy. Percutaneous drain with tip terminating in the gallbladder fossa. No definite fluid collection around the tip of the drainage catheter. Pancreas: No pancreatic mass. No pancreatic ductal dilatation. No pancreatic or peripancreatic fluid collections or inflammatory changes. Spleen:  Unremarkable. Adrenals/Urinary Tract: Bilateral kidneys and bilateral adrenal glands are normal in appearance. No hydroureteronephrosis in the visualized portions of the abdomen. Stomach/Bowel: Visualized portions are unremarkable. Vascular/Lymphatic: No aneurysm identified in the visualized abdominal vasculature. No lymphadenopathy noted in the abdomen. Other: No significant volume of ascites noted in the visualized portions of the peritoneal cavity. Musculoskeletal: No aggressive appearing osseous lesions are noted in the visualized portions of the abdomen. IMPRESSION: 1. Status post cholecystectomy with percutaneous drainage catheter in the gallbladder fossa, without residual fluid collection around the catheter tip. 2. Hepatic cirrhosis with developing hepatic fibrosis, most confluent in the right lobe of the liver. Electronically Signed   By: DVinnie LangtonM.D.   On: 05/25/2020 15:02   MR ABDOMEN MRCP W WO CONTAST  Result Date: 05/25/2020 CLINICAL DATA:  55year old female with history of right upper quadrant abdominal pain. Prior history of cholecystectomy. Evaluate for possible biliary fistula. EXAM: MRI ABDOMEN WITHOUT AND WITH CONTRAST (INCLUDING MRCP)  TECHNIQUE: Multiplanar multisequence MR imaging of the abdomen was performed both before and after the administration of intravenous contrast. Heavily T2-weighted images of the biliary and pancreatic ducts were obtained, and three-dimensional MRCP images were rendered by post processing. CONTRAST:  36m GADAVIST GADOBUTROL 1  MMOL/ML IV SOLN COMPARISON:  No priors. FINDINGS: Lower chest: Linear areas of signal intensity within the lower lobes of the lungs bilaterally, poorly evaluated on today's abdominal MRI, but statistically likely to represent areas of subsegmental atelectasis or scarring. Hepatobiliary: Liver has a slightly shrunken appearance and nodular contour, indicative of underlying cirrhosis. Scattered throughout the liver there are some lace-like regions of T2 hyperintensity which correspond to areas of progressive delayed enhancement on post gadolinium imaging, indicative of developing regions of fibrosis, most confluent in the right lobe of the liver. No suspicious cystic or solid hepatic lesions. No intra or extrahepatic biliary ductal dilatation. Status post cholecystectomy. Percutaneous drain with tip terminating in the gallbladder fossa. No definite fluid collection around the tip of the drainage catheter. Pancreas: No pancreatic mass. No pancreatic ductal dilatation. No pancreatic or peripancreatic fluid collections or inflammatory changes. Spleen:  Unremarkable. Adrenals/Urinary Tract: Bilateral kidneys and bilateral adrenal glands are normal in appearance. No hydroureteronephrosis in the visualized portions of the abdomen. Stomach/Bowel: Visualized portions are unremarkable. Vascular/Lymphatic: No aneurysm identified in the visualized abdominal vasculature. No lymphadenopathy noted in the abdomen. Other: No significant volume of ascites noted in the visualized portions of the peritoneal cavity. Musculoskeletal: No aggressive appearing osseous lesions are noted in the visualized portions of the abdomen. IMPRESSION: 1. Status post cholecystectomy with percutaneous drainage catheter in the gallbladder fossa, without residual fluid collection around the catheter tip. 2. Hepatic cirrhosis with developing hepatic fibrosis, most confluent in the right lobe of the liver. Electronically Signed   By: DVinnie LangtonM.D.    On: 05/25/2020 15:02     Scheduled Meds: . brexpiprazole  2 mg Oral QHS  . feeding supplement  237 mL Oral BID BM  . insulin aspart  0-9 Units Subcutaneous TID WC  . polyethylene glycol  17 g Oral Daily  . QUEtiapine  800 mg Oral QHS  . saccharomyces boulardii  250 mg Oral BID  . sodium chloride flush  5 mL Intracatheter Q8H  . topiramate  200 mg Oral BID  . vortioxetine HBr  20 mg Oral QHS  . ziprasidone  20 mg Oral QHS   Continuous Infusions: . dextrose Stopped (05/25/20 1518)  . piperacillin-tazobactam 3.375 g (05/26/20 0524)     LOS: 5 days    Time spent:  246 JNita Sells MD [facility] To contact the attending provider between 7A-7P or the covering provider during after hours 7P-7A, please log into the web site [email or web address] and access using universal [place] password for that web site. If you do not have the password, please call the hospital operator.  05/26/2020, 11:16 AM

## 2020-05-27 ENCOUNTER — Inpatient Hospital Stay (HOSPITAL_COMMUNITY): Payer: Medicare Other | Admitting: Certified Registered Nurse Anesthetist

## 2020-05-27 ENCOUNTER — Encounter (HOSPITAL_COMMUNITY)
Admission: EM | Disposition: A | Discharge status: Home / Self Care | Payer: Self-pay | Source: Home / Self Care | Attending: Family Medicine

## 2020-05-27 ENCOUNTER — Encounter (HOSPITAL_COMMUNITY): Payer: Self-pay | Admitting: Internal Medicine

## 2020-05-27 ENCOUNTER — Inpatient Hospital Stay (HOSPITAL_COMMUNITY): Payer: Medicare Other

## 2020-05-27 DIAGNOSIS — T85520A Displacement of bile duct prosthesis, initial encounter: Secondary | ICD-10-CM

## 2020-05-27 DIAGNOSIS — K668 Other specified disorders of peritoneum: Secondary | ICD-10-CM | POA: Diagnosis not present

## 2020-05-27 DIAGNOSIS — Z4659 Encounter for fitting and adjustment of other gastrointestinal appliance and device: Secondary | ICD-10-CM

## 2020-05-27 HISTORY — PX: BILIARY STENT PLACEMENT: SHX5538

## 2020-05-27 HISTORY — PX: REMOVAL OF STONES: SHX5545

## 2020-05-27 HISTORY — PX: ERCP: SHX5425

## 2020-05-27 HISTORY — PX: STENT REMOVAL: SHX6421

## 2020-05-27 LAB — COMPREHENSIVE METABOLIC PANEL
ALT: 30 U/L (ref 0–44)
AST: 42 U/L — ABNORMAL HIGH (ref 15–41)
Albumin: 3.4 g/dL — ABNORMAL LOW (ref 3.5–5.0)
Alkaline Phosphatase: 175 U/L — ABNORMAL HIGH (ref 38–126)
Anion gap: 12 (ref 5–15)
BUN: 12 mg/dL (ref 6–20)
CO2: 25 mmol/L (ref 22–32)
Calcium: 9.1 mg/dL (ref 8.9–10.3)
Chloride: 103 mmol/L (ref 98–111)
Creatinine, Ser: 0.66 mg/dL (ref 0.44–1.00)
GFR, Estimated: >60 mL/min (ref >60)
Glucose, Bld: 203 mg/dL — ABNORMAL HIGH (ref 70–99)
Potassium: 3.9 mmol/L (ref 3.5–5.1)
Sodium: 140 mmol/L (ref 135–145)
Total Bilirubin: 0.4 mg/dL (ref 0.3–1.2)
Total Protein: 7.3 g/dL (ref 6.5–8.1)

## 2020-05-27 LAB — LIPASE, BLOOD: Lipase: 43 U/L (ref 11–51)

## 2020-05-27 LAB — GLUCOSE, CAPILLARY
Glucose-Capillary: 142 mg/dL — ABNORMAL HIGH (ref 70–99)
Glucose-Capillary: 169 mg/dL — ABNORMAL HIGH (ref 70–99)
Glucose-Capillary: 252 mg/dL — ABNORMAL HIGH (ref 70–99)
Glucose-Capillary: 278 mg/dL — ABNORMAL HIGH (ref 70–99)
Glucose-Capillary: 393 mg/dL — ABNORMAL HIGH (ref 70–99)

## 2020-05-27 LAB — CBC
HCT: 36.8 % (ref 36.0–46.0)
Hemoglobin: 10.8 g/dL — ABNORMAL LOW (ref 12.0–15.0)
MCH: 25.5 pg — ABNORMAL LOW (ref 26.0–34.0)
MCHC: 29.3 g/dL — ABNORMAL LOW (ref 30.0–36.0)
MCV: 87 fL (ref 80.0–100.0)
Platelets: 228 10*3/uL (ref 150–400)
RBC: 4.23 MIL/uL (ref 3.87–5.11)
RDW: 15.7 % — ABNORMAL HIGH (ref 11.5–15.5)
WBC: 6.8 10*3/uL (ref 4.0–10.5)
nRBC: 0 % (ref 0.0–0.2)

## 2020-05-27 SURGERY — ERCP, WITH INTERVENTION IF INDICATED
Anesthesia: General

## 2020-05-27 MED ORDER — PROPOFOL 10 MG/ML IV BOLUS
INTRAVENOUS | Status: AC
  Filled 2020-05-27: qty 20

## 2020-05-27 MED ORDER — PHENYLEPHRINE HCL-NACL 10-0.9 MG/250ML-% IV SOLN
INTRAVENOUS | Status: DC | PRN
  Administered 2020-05-27: 50 ug/min via INTRAVENOUS

## 2020-05-27 MED ORDER — SUGAMMADEX SODIUM 500 MG/5ML IV SOLN
INTRAVENOUS | Status: DC | PRN
  Administered 2020-05-27: 300 mg via INTRAVENOUS

## 2020-05-27 MED ORDER — DEXAMETHASONE SODIUM PHOSPHATE 10 MG/ML IJ SOLN
INTRAMUSCULAR | Status: DC | PRN
  Administered 2020-05-27: 5 mg via INTRAVENOUS

## 2020-05-27 MED ORDER — PHENYLEPHRINE 40 MCG/ML (10ML) SYRINGE FOR IV PUSH (FOR BLOOD PRESSURE SUPPORT)
PREFILLED_SYRINGE | INTRAVENOUS | Status: DC | PRN
  Administered 2020-05-27 (×2): 200 ug via INTRAVENOUS

## 2020-05-27 MED ORDER — LIDOCAINE 2% (20 MG/ML) 5 ML SYRINGE
INTRAMUSCULAR | Status: DC | PRN
  Administered 2020-05-27: 40 mg via INTRAVENOUS

## 2020-05-27 MED ORDER — SODIUM CHLORIDE 0.9 % IV SOLN
INTRAVENOUS | Status: DC | PRN
  Administered 2020-05-27: 35 mL

## 2020-05-27 MED ORDER — INSULIN ASPART 100 UNIT/ML ~~LOC~~ SOLN
0.0000 [IU] | SUBCUTANEOUS | Status: DC
  Administered 2020-05-27: 9 [IU] via SUBCUTANEOUS
  Administered 2020-05-27: 5 [IU] via SUBCUTANEOUS
  Administered 2020-05-28: 7 [IU] via SUBCUTANEOUS
  Administered 2020-05-28: 5 [IU] via SUBCUTANEOUS
  Administered 2020-05-28: 3 [IU] via SUBCUTANEOUS
  Administered 2020-05-28: 2 [IU] via SUBCUTANEOUS
  Administered 2020-05-28: 5 [IU] via SUBCUTANEOUS
  Administered 2020-05-28: 1 [IU] via SUBCUTANEOUS
  Administered 2020-05-29 (×2): 5 [IU] via SUBCUTANEOUS
  Administered 2020-05-29: 2 [IU] via SUBCUTANEOUS
  Administered 2020-05-29: 7 [IU] via SUBCUTANEOUS
  Administered 2020-05-29: 3 [IU] via SUBCUTANEOUS
  Administered 2020-05-29: 2 [IU] via SUBCUTANEOUS
  Administered 2020-05-30: 3 [IU] via SUBCUTANEOUS
  Administered 2020-05-30: 9 [IU] via SUBCUTANEOUS
  Administered 2020-05-30: 5 [IU] via SUBCUTANEOUS

## 2020-05-27 MED ORDER — FENTANYL CITRATE (PF) 100 MCG/2ML IJ SOLN
INTRAMUSCULAR | Status: DC | PRN
  Administered 2020-05-27: 50 ug via INTRAVENOUS

## 2020-05-27 MED ORDER — GLUCAGON HCL RDNA (DIAGNOSTIC) 1 MG IJ SOLR
INTRAMUSCULAR | Status: AC
  Filled 2020-05-27: qty 1

## 2020-05-27 MED ORDER — MIDAZOLAM HCL 2 MG/2ML IJ SOLN
INTRAMUSCULAR | Status: AC
  Filled 2020-05-27: qty 2

## 2020-05-27 MED ORDER — PROPOFOL 10 MG/ML IV BOLUS
INTRAVENOUS | Status: DC | PRN
  Administered 2020-05-27: 100 mg via INTRAVENOUS

## 2020-05-27 MED ORDER — INDOMETHACIN 50 MG RE SUPP
RECTAL | Status: AC
  Filled 2020-05-27: qty 2

## 2020-05-27 MED ORDER — ROCURONIUM BROMIDE 10 MG/ML (PF) SYRINGE
PREFILLED_SYRINGE | INTRAVENOUS | Status: DC | PRN
  Administered 2020-05-27: 70 mg via INTRAVENOUS

## 2020-05-27 MED ORDER — GLUCAGON HCL RDNA (DIAGNOSTIC) 1 MG IJ SOLR
INTRAMUSCULAR | Status: DC | PRN
  Administered 2020-05-27: .25 mg via INTRAVENOUS

## 2020-05-27 MED ORDER — ONDANSETRON HCL 4 MG/2ML IJ SOLN
INTRAMUSCULAR | Status: DC | PRN
  Administered 2020-05-27: 4 mg via INTRAVENOUS

## 2020-05-27 MED ORDER — FENTANYL CITRATE (PF) 100 MCG/2ML IJ SOLN
INTRAMUSCULAR | Status: AC
  Filled 2020-05-27: qty 2

## 2020-05-27 MED ORDER — INDOMETHACIN 50 MG RE SUPP
RECTAL | Status: DC | PRN
  Administered 2020-05-27: 100 mg via RECTAL

## 2020-05-27 MED ORDER — MIDAZOLAM HCL 5 MG/5ML IJ SOLN
INTRAMUSCULAR | Status: DC | PRN
  Administered 2020-05-27: 2 mg via INTRAVENOUS

## 2020-05-27 MED ORDER — LACTATED RINGERS IV SOLN
INTRAVENOUS | Status: AC | PRN
  Administered 2020-05-27: 1000 mL via INTRAVENOUS

## 2020-05-27 NOTE — Telephone Encounter (Signed)
Patient was inpatient.  Taken care of by MD

## 2020-05-27 NOTE — Op Note (Addendum)
[facility] Patient Name: Kim Hicks Procedure Date: 05/27/2020 MRN: [id number] Attending MD: [name] , MD Date of Birth: 1965/08/19 CSN: [id number] Age: 55 Admit Type: Inpatient Procedure:                ERCP Indications:              Bile leak, Stent change Providers:                Pricilla Riffle. Fuller Plan, MD, Carlyn Reichert, RN, William Dalton, Technician Referring MD:             [facility] Medicines:                General Anesthesia Complications:            No immediate complications. Estimated blood loss:                            None Estimated Blood Loss:     Estimated blood loss: none. Procedure:                Pre-Anesthesia Assessment:                           - Prior to the procedure, a History and Physical                            was performed, and patient medications and                            allergies were reviewed. The patient's tolerance of                            previous anesthesia was also reviewed. The risks                            and benefits of the procedure and the sedation                            options and risks were discussed with the patient.                            All questions were answered, and informed consent                            was obtained. Prior Anticoagulants: The patient has                            taken no previous anticoagulant or antiplatelet                            agents. ASA Grade Assessment: III - A patient with                            severe systemic disease.  After reviewing the risks                            and benefits, the patient was deemed in                            satisfactory condition to undergo the procedure.                           After obtaining informed consent, the scope was                            passed under direct vision. Throughout the                            procedure, the patient's blood pressure, pulse, and                             oxygen saturations were monitored continuously. The                            Olympus [id number]-[id number]) was introduced through                            the mouth, and used to inject contrast into and                            used to inject contrast into the bile duct. The                            ERCP was accomplished without difficulty. The                            patient tolerated the procedure well. Scope In: Scope Out: Findings:      A scout film of the abdomen was obtained. Surgical clips, consistent       with a previous cholecystectomy, a RUQ drain and a metal biliary stent       were seen in the area of the right upper quadrant of the abdomen. The       scope was advanced to the major papilla in the descending duodenum.       Prior sphincterotomy noted. The metal biliary stent had migrated       distally with about half of the stent in duodenum. Examination of the       pharynx, larynx and associated structures, and upper GI tract was       otherwise normal. One metal biliary stent was removed from the biliary       tree using a rat-toothed forceps. Free biliary cannulation was       accomplished with a balloon catheter and then a wire was advanced to the       right hepatic duct. An occlusion cholangiogram was obtained with a       normal appearing post cholecystectomy was noted. A bile leak was not       demonstrated. The biliary tree was swept twice with a 12  mm balloon       starting at the bifurcation. A small amount of sludge was swept from the       duct. One 8.5 Fr by 7 cm transpapillary plastic stent with a single       external flap and a single internal flap was placed 6 cm into the common       bile duct. Bile flowed through the stent. The stent was in good       position. The PD was not injected or cannulated by intention. Impression:               - Prior sphincterotomy.                           - Metal biliary stent partially migrated into the                             duodenum.                           - One stent was removed from the biliary tree and                            duodenum.                           - The biliary tree was swept and sludge was found.                           - No bile leak demonstrated.                           - One plastic stent was placed into the common bile                            duct. Moderate Sedation:      Not Applicable - Patient had care per Anesthesia. Recommendation:           - Return patient to hospital ward for ongoing care.                           - Observe patient's clinical course following                            today's ERCP with therapeutic intervention.                           - Return to endoscopist for stent removal at ERCP                            in 2-3 months. Procedure Code(s):        --- Professional ---                           [phone], Endoscopic retrograde  cholangiopancreatography (ERCP); with removal and                            exchange of stent(s), biliary or pancreatic duct,                            including pre- and post-dilation and guide wire                            passage, when performed, including sphincterotomy,                            when performed, each stent exchanged                           [id number], Endoscopic retrograde                            cholangiopancreatography (ERCP); with removal of                            calculi/debris from biliary/pancreatic duct(s) Diagnosis Code(s):        --- Professional ---                           D56.86, Encounter for fitting and adjustment of                            other gastrointestinal appliance and device                           K83.8, Other specified diseases of biliary tract CPT copyright 2019 American Medical Association. All rights reserved. The codes documented in this report are preliminary and upon coder review may  be revised to meet  current compliance requirements. [name], MD 05/27/2020 1:56:22 PM This report has been signed electronically. Number of Addenda: 0

## 2020-05-27 NOTE — Anesthesia Postprocedure Evaluation (Signed)
Anesthesia Post Note  Patient: Kim Hicks  Procedure(s) Performed: ENDOSCOPIC RETROGRADE CHOLANGIOPANCREATOGRAPHY (ERCP) (N/A ) STENT REMOVAL REMOVAL OF STONES BILIARY STENT PLACEMENT (N/A )     Patient location during evaluation: Endoscopy Anesthesia Type: General Level of consciousness: awake Pain management: pain level controlled Vital Signs Assessment: post-procedure vital signs reviewed and stable Respiratory status: spontaneous breathing Cardiovascular status: stable Postop Assessment: no apparent nausea or vomiting Anesthetic complications: no   No complications documented.  Last Vitals:  Vitals:   05/27/20 1350 05/27/20 1429  BP: 132/80 135/76  Pulse: 88 91  Resp: 14 16  Temp:  36.6 C  SpO2: 93% 95%    Last Pain:  Vitals:   05/27/20 1429  TempSrc: Oral  PainSc:    Pain Goal: Patients Stated Pain Goal: 2 (05/27/20 1350)                 Huston Foley

## 2020-05-27 NOTE — Transfer of Care (Signed)
Immediate Anesthesia Transfer of Care Note  Patient: EVERLY RUBALCAVA  Procedure(s) Performed: ENDOSCOPIC RETROGRADE CHOLANGIOPANCREATOGRAPHY (ERCP) (N/A ) STENT REMOVAL REMOVAL OF STONES BILIARY STENT PLACEMENT (N/A )  Patient Location: PACU and Endoscopy Unit  Anesthesia Type:General  Level of Consciousness: awake, alert , oriented and patient cooperative  Airway & Oxygen Therapy: Patient Spontanous Breathing and Patient connected to face mask oxygen  Post-op Assessment: Report given to RN and Post -op Vital signs reviewed and stable  Post vital signs: Reviewed and stable  Last Vitals:  Vitals Value Taken Time  BP 153/65 05/27/20 1333  Temp    Pulse 91 05/27/20 1340  Resp 17 05/27/20 1340  SpO2 99 % 05/27/20 1340  Vitals shown include unvalidated device data.  Last Pain:  Vitals:   05/27/20 1149  TempSrc: Oral  PainSc: 8       Patients Stated Pain Goal: 2 (65/78/46 9629)  Complications: No complications documented.

## 2020-05-27 NOTE — Interval H&P Note (Signed)
History and Physical Interval Note:  05/27/2020 12:43 PM  Kim Hicks  has presented today for surgery, with the diagnosis of biliary obstruction.  The various methods of treatment have been discussed with the patient and family. After consideration of risks, benefits and other options for treatment, the patient has consented to  Procedure(s): ENDOSCOPIC RETROGRADE CHOLANGIOPANCREATOGRAPHY (ERCP) (N/A) as a surgical intervention.  The patient's history has been reviewed, patient examined, no change in status, stable for surgery.  I have reviewed the patient's chart and labs.  Questions were answered to the patient's satisfaction.     Pricilla Riffle. Fuller Plan

## 2020-05-27 NOTE — Anesthesia Preprocedure Evaluation (Addendum)
Anesthesia Evaluation  Patient identified by MRN, date of birth, ID band Patient awake    Reviewed: Allergy & Precautions, NPO status , Patient's Chart, lab work & pertinent test results  Airway Mallampati: II       Dental no notable dental hx.    Pulmonary neg pulmonary ROS,    Pulmonary exam normal        Cardiovascular hypertension, Pt. on medications Normal cardiovascular exam     Neuro/Psych  Headaches, PSYCHIATRIC DISORDERS Anxiety Depression    GI/Hepatic GERD  ,(+) Cirrhosis  (NA[facility])      ,   Endo/Other  negative endocrine ROSdiabetes, Insulin Dependent, Oral Hypoglycemic Agents  Renal/GU negative Renal ROS  negative genitourinary   Musculoskeletal negative musculoskeletal ROS (+)   Abdominal Normal abdominal exam  (+)   Peds  Hematology  (+) Blood dyscrasia (Hgb 10.8), anemia ,   Anesthesia Other Findings Persistent bile leak s/p cholecystectomy March 2021  Reproductive/Obstetrics                            Anesthesia Physical Anesthesia Plan  ASA: III  Anesthesia Plan: General   Post-op Pain Management:    Induction: Intravenous  PONV Risk Score and Plan: 3 and Midazolam, Dexamethasone and Ondansetron  Airway Management Planned: Oral ETT  Additional Equipment:   Intra-op Plan:   Post-operative Plan: Extubation in OR  Informed Consent: I have reviewed the patients History and Physical, chart, labs and discussed the procedure including the risks, benefits and alternatives for the proposed anesthesia with the patient or authorized representative who has indicated his/her understanding and acceptance.     Dental advisory given  Plan Discussed with: CRNA  Anesthesia Plan Comments:         Anesthesia Quick Evaluation

## 2020-05-27 NOTE — Progress Notes (Addendum)
PROGRESS NOTE   Kim Hicks  TUU:[id number] DOB: 01/18/1966 DOA: 05/20/2020 PCP: [name], FNP  Brief Narrative:   55 year old white female from home HTN, HLD, depression with psychosis NIDDM complicated by diabetic neuropathy severe cirrhosis of liver Lap chole 08/04/2019-difficult case-backwall of gallbladder left in place-found to have a contained bile leak status post ERCP sphincterotomy and stent 08/07/2019 followed by [facility]-was discharged on Augmentin  Subsequently 11/07/2019 underwent ERCP stent was exchanged in common bile duct with a new stent upsized 10 French plastic stent  In the interim was admitted at [facility] 03/11/2020 found to have E. coli in urine blood cultures 1 out of 4 bottles E. coli was treated with Levaquin Found to have RIb # 8th rib  Underwent a final repeat ERCP 12/20 with persistent bile leak clogged stent removed metal stent placed Developed on 12/21 progressive worsening R LQ pain diaphoresis chills-seen at GI clinic 12/27 and she was sent to [facility] long ED for imaging and further work-up  CT abdomen pelvis = multilocular fluid collection 4.6 X2.6 biloma/postprocedural seroma-GI consulted Zosyn started IR consulted and a drain was placed without resolution of abdominal pain-laxatives were also started as she was significantly constipated  MRCP performed showing persistent leak so planning for ERCP later this admission   Assessment & Plan:   Principal Problem:   Biloma Active Problems:   Insulin dependent type 2 diabetes mellitus ([place])   Hyperlipidemia associated with type 2 diabetes mellitus ([place])   Hypertension associated with diabetes ([place])   Other cirrhosis of liver ([place])   Bile leak   Abdominal pain   Malnutrition of moderate degree  Biloma versus postop seroma status post lap cholecystectomy Toradol 30 every 6 as needed for moderate pain-OxyIR 10 every 6 as needed morphine 2 to 4 mg every 2 as needed in addition to -at  baseline does not take medications for pain Severe pain after graduating diet 1/2 however better today with going back to a clear liquid diet Percutaneous drain 10 French placed 12/30 and CT demonstrating choledocho fistula? Persistent leak MRCP 05/25/2020 shows migration of covered metal stent not crossing area of cystic stump leak and they are planning for removal of existing stent with reinsertion of a new one across the leak Antibiotics adjusted to Unasyn discontinue as per GI Further planning per GI/IR Constipation for several days-seems chronic Passing stool with mag citrate Follow-up with bowel regimen MiraLAX scheduled  daily but hold if diarrhea Insulin-dependent DM TY 2 with severe neuropathy Not eating much-on clear liquid diet Discontinue off D5 after procedures are performed on 11/3 change to every 4 hourly sugars ranging 199 --203 Holding Basaglar 38 Metformin 1000 at bedtime and Synjardy for now Cirrhosis Secondary to NAFLD?-Outpatient follow-up with GI HTN  Lisinopril 20 prazosin  held may need to resume on discharge Outpatient further adjustment Anemia chronic disease  Currently stable Depression anxiety  Continue Topamax 200 twice daily, vortioxetine 20 Seroquel iii/day Rexulti 2 at bedtime and [name] 20 at bedtime  DVT prophylaxis: SCD Code Status: Full Family Communication: Discussed with husband on 05/26/2020 Seems to understand planning is dependent, ERCP Disposition:  Status is: Inpatient  Remains inpatient appropriate because:Hemodynamically unstable and Ongoing active pain requiring inpatient pain management   Dispo: The patient is from: Home              Anticipated d/c is to: Home              Anticipated d/c date is: 3 days  Patient currently is not medically stable to d/c.  Consultants:   GI  IR  Procedures: None  Antimicrobials: None   Subjective:  Pain is improved some Less nausea but still very Drain continues to drain No  fever no chills No nausea no vomiting  Objective: Vitals:   05/26/20 0539 05/26/20 1431 05/26/20 2034 05/27/20 0536  BP: 135/74 117/67 116/80 112/69  Pulse: 89 87 81 85  Resp: 18 15 15 20   Temp: 98.4 F (36.9 C) 98.3 F (36.8 C) 98.3 F (36.8 C) (!) 97.3 F (36.3 C)  TempSrc: Oral Oral Oral Oral  SpO2: 96% 96% 95% 93%  Weight:      Height:        Intake/Output Summary (Last 24 hours) at 05/27/2020 2637 Last data filed at 05/27/2020 0700 Gross per 24 hour  Intake 858.54 ml  Output 60 ml  Net 798.54 ml   Filed Weights   05/21/20 1815  Weight: 72.4 kg    Examination:  EOMI NCAT  tender in right upper quadrant and epigastrium with a drain in place S1-S2 no murmur Chest clear no added sound No lower extremity edema No focal deficits  Data Reviewed: I have personally reviewed following labs and imaging studies Sodium 140 BUNs/creatinine 12/0.6 LFTs relatively normal White count 6.8, hemoglobin 10.8, platelet 220  Radiology Studies: MR 3D Recon At Scanner  Result Date: 05/25/2020 CLINICAL DATA:  55 year old female with history of right upper quadrant abdominal pain. Prior history of cholecystectomy. Evaluate for possible biliary fistula. EXAM: MRI ABDOMEN WITHOUT AND WITH CONTRAST (INCLUDING MRCP) TECHNIQUE: Multiplanar multisequence MR imaging of the abdomen was performed both before and after the administration of intravenous contrast. Heavily T2-weighted images of the biliary and pancreatic ducts were obtained, and three-dimensional MRCP images were rendered by post processing. CONTRAST:  22m GADAVIST GADOBUTROL 1 MMOL/ML IV SOLN COMPARISON:  No priors. FINDINGS: Lower chest: Linear areas of signal intensity within the lower lobes of the lungs bilaterally, poorly evaluated on today's abdominal MRI, but statistically likely to represent areas of subsegmental atelectasis or scarring. Hepatobiliary: Liver has a slightly shrunken appearance and nodular contour, indicative of  underlying cirrhosis. Scattered throughout the liver there are some lace-like regions of T2 hyperintensity which correspond to areas of progressive delayed enhancement on post gadolinium imaging, indicative of developing regions of fibrosis, most confluent in the right lobe of the liver. No suspicious cystic or solid hepatic lesions. No intra or extrahepatic biliary ductal dilatation. Status post cholecystectomy. Percutaneous drain with tip terminating in the gallbladder fossa. No definite fluid collection around the tip of the drainage catheter. Pancreas: No pancreatic mass. No pancreatic ductal dilatation. No pancreatic or peripancreatic fluid collections or inflammatory changes. Spleen:  Unremarkable. Adrenals/Urinary Tract: Bilateral kidneys and bilateral adrenal glands are normal in appearance. No hydroureteronephrosis in the visualized portions of the abdomen. Stomach/Bowel: Visualized portions are unremarkable. Vascular/Lymphatic: No aneurysm identified in the visualized abdominal vasculature. No lymphadenopathy noted in the abdomen. Other: No significant volume of ascites noted in the visualized portions of the peritoneal cavity. Musculoskeletal: No aggressive appearing osseous lesions are noted in the visualized portions of the abdomen. IMPRESSION: 1. Status post cholecystectomy with percutaneous drainage catheter in the gallbladder fossa, without residual fluid collection around the catheter tip. 2. Hepatic cirrhosis with developing hepatic fibrosis, most confluent in the right lobe of the liver. Electronically Signed   By: DVinnie LangtonM.D.   On: 05/25/2020 15:02   MR ABDOMEN MRCP W WO CONTAST  Result Date: 05/25/2020  CLINICAL DATA:  55 year old female with history of right upper quadrant abdominal pain. Prior history of cholecystectomy. Evaluate for possible biliary fistula. EXAM: MRI ABDOMEN WITHOUT AND WITH CONTRAST (INCLUDING MRCP) TECHNIQUE: Multiplanar multisequence MR imaging of the abdomen  was performed both before and after the administration of intravenous contrast. Heavily T2-weighted images of the biliary and pancreatic ducts were obtained, and three-dimensional MRCP images were rendered by post processing. CONTRAST:  107m GADAVIST GADOBUTROL 1 MMOL/ML IV SOLN COMPARISON:  No priors. FINDINGS: Lower chest: Linear areas of signal intensity within the lower lobes of the lungs bilaterally, poorly evaluated on today's abdominal MRI, but statistically likely to represent areas of subsegmental atelectasis or scarring. Hepatobiliary: Liver has a slightly shrunken appearance and nodular contour, indicative of underlying cirrhosis. Scattered throughout the liver there are some lace-like regions of T2 hyperintensity which correspond to areas of progressive delayed enhancement on post gadolinium imaging, indicative of developing regions of fibrosis, most confluent in the right lobe of the liver. No suspicious cystic or solid hepatic lesions. No intra or extrahepatic biliary ductal dilatation. Status post cholecystectomy. Percutaneous drain with tip terminating in the gallbladder fossa. No definite fluid collection around the tip of the drainage catheter. Pancreas: No pancreatic mass. No pancreatic ductal dilatation. No pancreatic or peripancreatic fluid collections or inflammatory changes. Spleen:  Unremarkable. Adrenals/Urinary Tract: Bilateral kidneys and bilateral adrenal glands are normal in appearance. No hydroureteronephrosis in the visualized portions of the abdomen. Stomach/Bowel: Visualized portions are unremarkable. Vascular/Lymphatic: No aneurysm identified in the visualized abdominal vasculature. No lymphadenopathy noted in the abdomen. Other: No significant volume of ascites noted in the visualized portions of the peritoneal cavity. Musculoskeletal: No aggressive appearing osseous lesions are noted in the visualized portions of the abdomen. IMPRESSION: 1. Status post cholecystectomy with  percutaneous drainage catheter in the gallbladder fossa, without residual fluid collection around the catheter tip. 2. Hepatic cirrhosis with developing hepatic fibrosis, most confluent in the right lobe of the liver. Electronically Signed   By: DVinnie LangtonM.D.   On: 05/25/2020 15:02     Scheduled Meds: . brexpiprazole  2 mg Oral QHS  . feeding supplement  237 mL Oral BID BM  . insulin aspart  0-9 Units Subcutaneous TID WC  . pantoprazole  40 mg Oral Daily  . polyethylene glycol  17 g Oral Daily  . pregabalin  100 mg Oral TID  . QUEtiapine  800 mg Oral QHS  . saccharomyces boulardii  250 mg Oral BID  . sodium chloride flush  5 mL Intracatheter Q8H  . topiramate  200 mg Oral BID  . vortioxetine HBr  20 mg Oral QHS  . ziprasidone  20 mg Oral QHS   Continuous Infusions: . ampicillin-sulbactam (UNASYN) IV 3 g (05/27/20 0915)  . dextrose Stopped (05/26/20 1513)     LOS: 6 days    Time spent:  135 JNita Sells MD [facility] To contact the attending provider between 7A-7P or the covering provider during after hours 7P-7A, please log into the web site [email or web address] and access using universal [place] password for that web site. If you do not have the password, please call the hospital operator.  05/27/2020, 9:56 AM

## 2020-05-27 NOTE — Progress Notes (Signed)
Inpatient Diabetes Program Recommendations  AACE/ADA: New Consensus Statement on Inpatient Glycemic Control (2015)  Target Ranges:  Prepandial:   less than 140 mg/dL      Peak postprandial:   less than 180 mg/dL (1-2 hours)      Critically ill patients:  140 - 180 mg/dL   Results for KAYDA, ALLERS (MRN [id number]) as of 05/27/2020 07:28  Ref. Range 05/26/2020 00:07 05/26/2020 04:12 05/26/2020 07:24 05/26/2020 12:07 05/26/2020 16:47 05/26/2020 22:00  Glucose-Capillary Latest Ref Range: 70 - 99 mg/dL 152 (H) 169 (H) 130 (H)  1 unit NOVOLOG  318 (H)  7 units NOVOLOG  291 (H)  5 units NOVOLOG  199 (H)   Results for YAVONNE, KISS (MRN [id number]) as of 05/27/2020 08:50  Ref. Range 05/27/2020 07:54  Glucose-Capillary Latest Ref Range: 70 - 99 mg/dL 169 (H)   Results for ULDINE, FUSTER (MRN [id number]) as of 05/27/2020 07:28  Ref. Range 05/21/2020 05:06  Hemoglobin A1C Latest Ref Range: 4.8 - 5.6 % 8.2 (H)    Admit with: Biloma versus postop seroma status post lap cholecystectomy  History: DM  Home DM Meds: Trulicity 4 mg Qweek       Synjardy 25/1000 mg Daily       Basaglar 38 units QHS       Metformin 1000 mg QHS  Current Orders: Novolog Sensitive Correction Scale/ SSI (0-9 units) TID AC   ERCP today for possible removal of the existing stent and reinserting new stent across the site of the leak   MD- Please consider the following in-hospital insulin adjustments:  Increase the frequency of the Novolog SSI to Q4 hours (pt NPO today and looks like she may be having poor oral intake when taking clear liquid diet)  May also consider increasing the strength of the Novolog SSI to the Moderate scale (0-15 units)     --Will follow patient during hospitalization--  Wyn Quaker RN, MSN, CDE Diabetes Coordinator Inpatient Glycemic Control Team Team Pager: [phone] (8a-5p)

## 2020-05-27 NOTE — Anesthesia Procedure Notes (Addendum)

## 2020-05-27 NOTE — Care Management Important Message (Signed)
Important Message  Patient Details IM Letter given to the Patient. Name: Kim Hicks MRN: [id number] Date of Birth: [birth year]   Medicare Important Message Given:  Yes     Kerin Salen 05/27/2020, 11:42 AM

## 2020-05-28 ENCOUNTER — Inpatient Hospital Stay (HOSPITAL_COMMUNITY): Payer: Medicare Other

## 2020-05-28 ENCOUNTER — Telehealth: Payer: Self-pay

## 2020-05-28 DIAGNOSIS — K668 Other specified disorders of peritoneum: Secondary | ICD-10-CM | POA: Diagnosis not present

## 2020-05-28 DIAGNOSIS — K839 Disease of biliary tract, unspecified: Secondary | ICD-10-CM | POA: Diagnosis not present

## 2020-05-28 LAB — COMPREHENSIVE METABOLIC PANEL
ALT: 26 U/L (ref 0–44)
AST: 36 U/L (ref 15–41)
Albumin: 3.2 g/dL — ABNORMAL LOW (ref 3.5–5.0)
Alkaline Phosphatase: 139 U/L — ABNORMAL HIGH (ref 38–126)
Anion gap: 12 (ref 5–15)
BUN: 13 mg/dL (ref 6–20)
CO2: 23 mmol/L (ref 22–32)
Calcium: 9 mg/dL (ref 8.9–10.3)
Chloride: 103 mmol/L (ref 98–111)
Creatinine, Ser: 0.55 mg/dL (ref 0.44–1.00)
GFR, Estimated: >60 mL/min (ref >60)
Glucose, Bld: 181 mg/dL — ABNORMAL HIGH (ref 70–99)
Potassium: 3.7 mmol/L (ref 3.5–5.1)
Sodium: 138 mmol/L (ref 135–145)
Total Bilirubin: 0.4 mg/dL (ref 0.3–1.2)
Total Protein: 6.5 g/dL (ref 6.5–8.1)

## 2020-05-28 LAB — CBC WITH DIFFERENTIAL/PLATELET
Abs Immature Granulocytes: 0.03 10*3/uL (ref 0.00–0.07)
Basophils Absolute: 0 10*3/uL (ref 0.0–0.1)
Basophils Relative: 1 %
Eosinophils Absolute: 0.2 10*3/uL (ref 0.0–0.5)
Eosinophils Relative: 3 %
HCT: 33.7 % — ABNORMAL LOW (ref 36.0–46.0)
Hemoglobin: 9.9 g/dL — ABNORMAL LOW (ref 12.0–15.0)
Immature Granulocytes: 1 %
Lymphocytes Relative: 29 %
Lymphs Abs: 1.8 10*3/uL (ref 0.7–4.0)
MCH: 25.7 pg — ABNORMAL LOW (ref 26.0–34.0)
MCHC: 29.4 g/dL — ABNORMAL LOW (ref 30.0–36.0)
MCV: 87.5 fL (ref 80.0–100.0)
Monocytes Absolute: 0.5 10*3/uL (ref 0.1–1.0)
Monocytes Relative: 8 %
Neutro Abs: 3.7 10*3/uL (ref 1.7–7.7)
Neutrophils Relative %: 58 %
Platelets: 239 10*3/uL (ref 150–400)
RBC: 3.85 MIL/uL — ABNORMAL LOW (ref 3.87–5.11)
RDW: 15.5 % (ref 11.5–15.5)
WBC: 6.1 10*3/uL (ref 4.0–10.5)
nRBC: 0 % (ref 0.0–0.2)

## 2020-05-28 LAB — AEROBIC/ANAEROBIC CULTURE W GRAM STAIN (SURGICAL/DEEP WOUND): Special Requests: NORMAL

## 2020-05-28 LAB — GLUCOSE, CAPILLARY
Glucose-Capillary: 173 mg/dL — ABNORMAL HIGH (ref 70–99)
Glucose-Capillary: 150 mg/dL — ABNORMAL HIGH (ref 70–99)
Glucose-Capillary: 315 mg/dL — ABNORMAL HIGH (ref 70–99)
Glucose-Capillary: 288 mg/dL — ABNORMAL HIGH (ref 70–99)
Glucose-Capillary: 226 mg/dL — ABNORMAL HIGH (ref 70–99)

## 2020-05-28 LAB — LIPASE, BLOOD: Lipase: 28 U/L (ref 11–51)

## 2020-05-28 MED ORDER — KETOROLAC TROMETHAMINE 15 MG/ML IJ SOLN
15.0000 mg | Freq: Four times a day (QID) | INTRAMUSCULAR | Status: DC | PRN
  Administered 2020-05-28 – 2020-05-29 (×3): 15 mg via INTRAVENOUS
  Filled 2020-05-28 (×3): qty 1

## 2020-05-28 MED ORDER — POLYETHYLENE GLYCOL 3350 17 G PO PACK
17.0000 g | PACK | Freq: Two times a day (BID) | ORAL | Status: DC
  Administered 2020-05-28 – 2020-05-29 (×3): 17 g via ORAL
  Filled 2020-05-28 (×4): qty 1

## 2020-05-28 MED ORDER — SENNOSIDES-DOCUSATE SODIUM 8.6-50 MG PO TABS
1.0000 | ORAL_TABLET | Freq: Every day | ORAL | Status: DC
  Administered 2020-05-28 – 2020-05-29 (×2): 1 via ORAL
  Filled 2020-05-28 (×2): qty 1

## 2020-05-28 MED ORDER — SODIUM CHLORIDE 0.9 % IV SOLN: INTRAVENOUS | Status: DC

## 2020-05-28 NOTE — Progress Notes (Addendum)
Progress Note    Subjective  Chief Complaint: Biloma, right upper quadrant abdominal pain  This morning, the patient tells me that she is doing fairly well, she continues with some right sided chest pain, right back pain, right upper quadrant abdominal pain.  Tells me that she is "draining a lot of fluid", denies any new complaints or concerns. Pain increased with coughing.    Objective   Vital signs in last 24 hours: Temp:  [97.6 F (36.4 C)-99.1 F (37.3 C)] 97.6 F (36.4 C) (01/04 0435) Pulse Rate:  [78-91] 78 (01/04 0435) Resp:  [14-18] 18 (01/04 0435) BP: (99-153)/(54-80) 99/62 (01/04 0435) SpO2:  [91 %-100 %] 91 % (01/04 0435) Weight:  [72.4 kg] 72.4 kg (01/03 1149) Last BM Date: 05/27/20 General:    white female in NAD Heart:  Regular rate and rhythm; no murmurs Lungs: Respirations even and unlabored, lungs CTA bilaterally Chest: tenderness over lower right anterior and right lateral chest wall Abdomen:  Soft, nontender and nondistended. Normal bowel sounds.+ biliary drain with 25cc brownish thin liquid Extremities:  Without edema. Neurologic:  Alert and oriented,  grossly normal neurologically. Psych:  Cooperative. Normal mood and affect.  Intake/Output from previous day: 01/03 0701 - 01/04 0700 In: 810.4 [I.V.:600; IV Piggyback:210.4] Out: 75 [Drains:75] Intake/Output this shift: Total I/O In: -  Out: 50 [Drains:50]  Lab Results: Recent Labs    05/27/20 0610 05/28/20 0522  WBC 6.8 6.1  HGB 10.8* 9.9*  HCT 36.8 33.7*  PLT 228 239   BMET Recent Labs    05/27/20 0610 05/28/20 0522  NA 140 138  K 3.9 3.7  CL 103 103  CO2 25 23  GLUCOSE 203* 181*  BUN 12 13  CREATININE 0.66 0.55  CALCIUM 9.1 9.0   LFT Recent Labs    05/28/20 0522  PROT 6.5  ALBUMIN 3.2*  AST 36  ALT 26  ALKPHOS 139*  BILITOT 0.4    Studies/Results: DG ERCP  Result Date: 05/27/2020 CLINICAL DATA:  Biliary stent manipulation EXAM: ERCP TECHNIQUE: Multiple spot images  obtained with the fluoroscopic device and submitted for interpretation post-procedure. FLUOROSCOPY TIME:  Fluoroscopy Time:  2 minutes 51 seconds COMPARISON:  MRCP 05/25/2020 FINDINGS: Twenty-one images are submitted for review. The images demonstrate a flexible duodenal scope in the descending duodenum. A percutaneous drainage catheter is present in the right upper quadrant. A metallic biliary stents is also present in the common bile duct. Subsequent images demonstrate removal of the biliary stent and cannulation of the intrahepatic ducts with cholangiography. Subsequently, balloon sweeping of the common duct is performed followed by placement of a plastic biliary stent. IMPRESSION: 1. Removal of existing metallic biliary stent, balloon sweeping of the bile duct and placement of a plastic biliary stent. These images were submitted for radiologic interpretation only. Please see the procedural report for the amount of contrast and the fluoroscopy time utilized. Electronically Signed   By: Jacqulynn Cadet M.D.   On: 05/27/2020 13:36      Assessment / Plan:   Assessment: 1.  Bile leak: Status post successful ERCP with stent placement yesterday 2.  Continued right upper quadrant pain: Question chest wall pain versus relation to broken rib versus other, do not believe this is GI related  Plan: 1.  Patient will need follow-up as an outpatient with IR for right upper quadrant drain removal 2.  Will arrange for patient to have follow-up in our outpatient office in 1 month.  We will also arrange  for repeat ERCP with Dr. Fuller Plan in 2 to 3 months for stent removal 3.  Patient will likely need to follow-up with her primary care provider for chest wall pain management. 4.  We will sign off today.  Please await any final recommendations from Dr. Fuller Plan later this afternoon.  Thank you for your kind consultation.   LOS: 7 days   Levin Erp  05/28/2020, 10:25 AM      Attending Physician Note   I  have taken an interval history, reviewed the chart and examined the patient. I agree with the Advanced Practitioner's note, impression and recommendations.   Impression: * Biloma, resolved on recent MRI. Rare Enterococcus fecalis from 12/30 GB fossa fluid culture.   * Bile leak, biliary fistula with persistent drainage. ERCP yesterday with a new plastic biliary stent placed and removal of the covered metal stent that had migrated distally. IR RUQ drain in place.   * Right chest wall pain, RUQ pain due to possible nondisplaced 8th and 9th right rib fractures and RUQ drain likely causing some diaphragmatic irritation.   Recommendation: * Continue Unasyn in hospital and change to PO antibiotics at discharge to complete a 10-14 day course of antibiotics.  * Should be OK for discharge soon from GI standpoint.  * IR, PCP and GI follow up as outlined above. GI signing off and available if needed.    Lucio Edward, MD [facility]

## 2020-05-28 NOTE — Progress Notes (Addendum)
Referring Physician(s): Gupta,R  Supervising Physician: Ruthann Cancer  Patient Status:  [facility] - In-pt  Chief Complaint:  Right upper abdominal pain  Subjective: Pt cont to have some rt upper abd discomfort, more so with deep inspiration, likely secondary to diaphragmatic irritation for drain   Allergies: Talwin [pentazocine]  Medications: Prior to Admission medications   Medication Sig Start Date End Date Taking? Authorizing Provider  albuterol (VENTOLIN HFA) 108 (90 Base) MCG/ACT inhaler Inhale 2 puffs into the lungs every 6 (six) hours as needed for shortness of breath or wheezing. 02/27/20  Yes [provider]  brexpiprazole (REXULTI) 2 MG TABS tablet Take 2 mg by mouth at bedtime.   Yes [provider]  calcium carbonate (OSCAL) 1500 (600 Ca) MG TABS tablet Take 600 mg of elemental calcium by mouth daily with breakfast.   Yes [provider]  Empagliflozin-metFORMIN HCl ER (SYNJARDY XR) 25-1000 MG TB24 Take 1 tablet by mouth daily before breakfast.  11/15/18  Yes [provider]  ezetimibe (ZETIA) 10 MG tablet Take 10 mg by mouth daily. 04/25/20  Yes [provider]  ferrous sulfate 325 (65 FE) MG tablet Take 325 mg by mouth daily with breakfast.   Yes [provider]  Insulin Glargine (BASAGLAR KWIKPEN) 100 UNIT/ML Inject 38 Units into the skin at bedtime. 03/20/20  Yes [provider]  LINZESS 290 MCG CAPS capsule Take 290 mcg by mouth every morning. 08/16/19  Yes [provider]  lisinopril (ZESTRIL) 20 MG tablet TAKE 1 TABLET BY MOUTH EVERY DAY Patient taking differently: Take 20 mg by mouth daily. 02/08/20  Yes [name], MD  metFORMIN (GLUCOPHAGE) 1000 MG tablet Take 1,000 mg by mouth at bedtime. 02/27/20  Yes [provider]  Omega-3 Fatty Acids (OMEGA 3 PO) Take 500 mg by mouth daily.    Yes [provider]  prazosin (MINIPRESS) 2 MG capsule Take 4 mg by mouth at bedtime.   Yes  [provider]  pregabalin (LYRICA) 100 MG capsule TAKE 1 CAPSULE BY MOUTH THREE TIMES A DAY Patient taking differently: Take 100 mg by mouth 3 (three) times daily. 02/12/20  Yes [name], NP  QUEtiapine (SEROQUEL) 400 MG tablet Take 800 mg by mouth at bedtime.   Yes [provider]  simvastatin (ZOCOR) 10 MG tablet Take 10 mg by mouth daily before breakfast.  12/13/17  Yes [provider]  topiramate (TOPAMAX) 200 MG tablet Take 200 mg by mouth 2 (two) times daily. 04/12/20  Yes [provider]  traMADol ([name]) 50 MG tablet Take 50 mg by mouth daily as needed for moderate pain.  01/13/19  Yes [provider]  vortioxetine HBr ([name]) 20 MG TABS tablet Take 20 mg by mouth at bedtime.   Yes [provider]  ziprasidone ([name]) 20 MG capsule Take 20 mg by mouth at bedtime.   Yes [provider]  Dulaglutide 4.5 MG/0.5ML SOPN Inject 4 mg into the skin once a week. Monday 12/20/18   [provider]     Vital Signs: BP 99/62   Pulse 78   Temp 97.6 F (36.4 C) (Oral)   Resp 18   Ht 5' 4"  (1.626 m)   Wt 159 lb 9.8 oz (72.4 kg)   SpO2 91%   BMI 27.40 kg/m   Physical Exam awake/alert; RUQ drain intact, insertion site ok, area mildly tender, OP 50 cc light brown/somewhat bilious appearing fluid; drain flushed without difficulty  Imaging: MR  3D Recon At Scanner  Result Date: 05/25/2020 CLINICAL DATA:  55 year old female with history of right upper quadrant abdominal pain. Prior history of cholecystectomy. Evaluate for possible biliary fistula. EXAM: MRI ABDOMEN WITHOUT AND WITH CONTRAST (INCLUDING MRCP) TECHNIQUE: Multiplanar multisequence MR imaging of the abdomen was performed both before and after the administration of intravenous contrast. Heavily T2-weighted images of the biliary and pancreatic ducts were obtained, and three-dimensional MRCP images were rendered by post processing. CONTRAST:  58m GADAVIST  GADOBUTROL 1 MMOL/ML IV SOLN COMPARISON:  No priors. FINDINGS: Lower chest: Linear areas of signal intensity within the lower lobes of the lungs bilaterally, poorly evaluated on today's abdominal MRI, but statistically likely to represent areas of subsegmental atelectasis or scarring. Hepatobiliary: Liver has a slightly shrunken appearance and nodular contour, indicative of underlying cirrhosis. Scattered throughout the liver there are some lace-like regions of T2 hyperintensity which correspond to areas of progressive delayed enhancement on post gadolinium imaging, indicative of developing regions of fibrosis, most confluent in the right lobe of the liver. No suspicious cystic or solid hepatic lesions. No intra or extrahepatic biliary ductal dilatation. Status post cholecystectomy. Percutaneous drain with tip terminating in the gallbladder fossa. No definite fluid collection around the tip of the drainage catheter. Pancreas: No pancreatic mass. No pancreatic ductal dilatation. No pancreatic or peripancreatic fluid collections or inflammatory changes. Spleen:  Unremarkable. Adrenals/Urinary Tract: Bilateral kidneys and bilateral adrenal glands are normal in appearance. No hydroureteronephrosis in the visualized portions of the abdomen. Stomach/Bowel: Visualized portions are unremarkable. Vascular/Lymphatic: No aneurysm identified in the visualized abdominal vasculature. No lymphadenopathy noted in the abdomen. Other: No significant volume of ascites noted in the visualized portions of the peritoneal cavity. Musculoskeletal: No aggressive appearing osseous lesions are noted in the visualized portions of the abdomen. IMPRESSION: 1. Status post cholecystectomy with percutaneous drainage catheter in the gallbladder fossa, without residual fluid collection around the catheter tip. 2. Hepatic cirrhosis with developing hepatic fibrosis, most confluent in the right lobe of the liver. Electronically Signed   By: DVinnie LangtonM.D.   On: 05/25/2020 15:02   DG ERCP  Result Date: 05/27/2020 CLINICAL DATA:  Biliary stent manipulation EXAM: ERCP TECHNIQUE: Multiple spot images obtained with the fluoroscopic device and submitted for interpretation post-procedure. FLUOROSCOPY TIME:  Fluoroscopy Time:  2 minutes 51 seconds COMPARISON:  MRCP 05/25/2020 FINDINGS: Twenty-one images are submitted for review. The images demonstrate a flexible duodenal scope in the descending duodenum. A percutaneous drainage catheter is present in the right upper quadrant. A metallic biliary stents is also present in the common bile duct. Subsequent images demonstrate removal of the biliary stent and cannulation of the intrahepatic ducts with cholangiography. Subsequently, balloon sweeping of the common duct is performed followed by placement of a plastic biliary stent. IMPRESSION: 1. Removal of existing metallic biliary stent, balloon sweeping of the bile duct and placement of a plastic biliary stent. These images were submitted for radiologic interpretation only. Please see the procedural report for the amount of contrast and the fluoroscopy time utilized. Electronically Signed   By: HJacqulynn CadetM.D.   On: 05/27/2020 13:36   MR ABDOMEN MRCP W WO CONTAST  Result Date: 05/25/2020 CLINICAL DATA:  55year old female with history of right upper quadrant abdominal pain. Prior history of cholecystectomy. Evaluate for possible biliary fistula. EXAM: MRI ABDOMEN WITHOUT AND WITH CONTRAST (INCLUDING MRCP) TECHNIQUE: Multiplanar multisequence MR imaging of the abdomen was performed both before and after the administration of intravenous contrast. Heavily T2-weighted images of  the biliary and pancreatic ducts were obtained, and three-dimensional MRCP images were rendered by post processing. CONTRAST:  57m GADAVIST GADOBUTROL 1 MMOL/ML IV SOLN COMPARISON:  No priors. FINDINGS: Lower chest: Linear areas of signal intensity within the lower lobes of the lungs  bilaterally, poorly evaluated on today's abdominal MRI, but statistically likely to represent areas of subsegmental atelectasis or scarring. Hepatobiliary: Liver has a slightly shrunken appearance and nodular contour, indicative of underlying cirrhosis. Scattered throughout the liver there are some lace-like regions of T2 hyperintensity which correspond to areas of progressive delayed enhancement on post gadolinium imaging, indicative of developing regions of fibrosis, most confluent in the right lobe of the liver. No suspicious cystic or solid hepatic lesions. No intra or extrahepatic biliary ductal dilatation. Status post cholecystectomy. Percutaneous drain with tip terminating in the gallbladder fossa. No definite fluid collection around the tip of the drainage catheter. Pancreas: No pancreatic mass. No pancreatic ductal dilatation. No pancreatic or peripancreatic fluid collections or inflammatory changes. Spleen:  Unremarkable. Adrenals/Urinary Tract: Bilateral kidneys and bilateral adrenal glands are normal in appearance. No hydroureteronephrosis in the visualized portions of the abdomen. Stomach/Bowel: Visualized portions are unremarkable. Vascular/Lymphatic: No aneurysm identified in the visualized abdominal vasculature. No lymphadenopathy noted in the abdomen. Other: No significant volume of ascites noted in the visualized portions of the peritoneal cavity. Musculoskeletal: No aggressive appearing osseous lesions are noted in the visualized portions of the abdomen. IMPRESSION: 1. Status post cholecystectomy with percutaneous drainage catheter in the gallbladder fossa, without residual fluid collection around the catheter tip. 2. Hepatic cirrhosis with developing hepatic fibrosis, most confluent in the right lobe of the liver. Electronically Signed   By: DVinnie LangtonM.D.   On: 05/25/2020 15:02    Labs:  CBC: Recent Labs    05/24/20 0605 05/25/20 0541 05/27/20 0610 05/28/20 0522  WBC 6.8 6.9  6.8 6.1  HGB 9.5* 9.3* 10.8* 9.9*  HCT 33.8* 32.2* 36.8 33.7*  PLT 228 224 228 239    COAGS: Recent Labs    08/05/19 0530 05/20/20 2100  INR 1.2 1.6*  APTT 30 58*    BMP: Recent Labs    08/09/19 0431 08/11/19 0445 11/07/19 1204 02/16/20 0520 05/20/20 1152 05/24/20 0605 05/25/20 0541 05/27/20 0610 05/28/20 0522  NA 138 142 140 138   < > 138 138 140 138  K 3.6 4.0 4.9 3.9   < > 4.1 4.2 3.9 3.7  CL 107 104 108 107   < > 106 104 103 103  CO2 20* 29 20* 17*   < > 20* 24 25 23   GLUCOSE 181* 221* 321* 339*   < > 105* 102* 203* 181*  BUN 15 8 31* 39*   < > 14 14 12 13   CALCIUM 8.1* 8.2* 8.4* 8.1*   < > 8.6* 8.5* 9.1 9.0  CREATININE 0.51 0.40* 0.57 1.08*   < > 0.60 0.55 0.66 0.55  GFRNONAA >60 >60 >60 58*   < > >60 >60 >60 >60  GFRAA >60 >60 >60 >60  --   --   --   --   --    < > = values in this interval not displayed.    LIVER FUNCTION TESTS: Recent Labs    05/24/20 0605 05/25/20 0541 05/27/20 0610 05/28/20 0522  BILITOT 0.9 0.6 0.4 0.4  AST 50* 47* 42* 36  ALT 35 34 30 26  ALKPHOS 187* 179* 175* 139*  PROT 6.7 6.5 7.3 6.5  ALBUMIN 3.2* 3.1* 3.4* 3.2*  Assessment and Plan: History of acute cholecystitis s/p cholecystectomy in 05/3141; complicated by persistent cystic duck leak causing biloma despite plastic (occluded) and metal biliary stent placement s/p gallbladder fossa drain placement in IR (for hopes at healing biliary fistula) 05/23/2020; s/p ERCP yesterday with removal of preexisting partially displaced metallic stent and insertion of new CBD plastic stent; afebrile; WBC nl; hgb 9.9, creat nl; t bili nl; prior drain fl cx with rare enterococcus; continue to flush drain once daily with 5 cc sterile NS, monitor OP closely- once OP minimal obtain f/u CT prior to drain removal(can be done either inhouse or at IR drain clinic); pt for right rib detail film today to r/o fx   Electronically Signed: D. Rowe Robert, PA-C 05/28/2020, 10:49 AM   I spent a total of  15 minutes at the the patient's bedside AND on the patient's hospital floor or unit, greater than 50% of which was counseling/coordinating care for gallbladder fossa drain    Patient ID: Kim Hicks, female   DOB: 18-Feb-1966, 55 y.o.   MRN: [id number]

## 2020-05-28 NOTE — Telephone Encounter (Signed)
-----   Message from Levin Erp, [place] sent at 05/28/2020 10:30 AM EST ----- Regarding: appt and procedure Needs outpatient appointment with [name] in 1 month.  Also needs repeat ERCP with Dr. Fuller Plan in 2 to 3 months.  Thank you, JLL

## 2020-05-28 NOTE — Progress Notes (Addendum)
PROGRESS NOTE   Kim Hicks  VQM:[id number] DOB: 03/14/1966 DOA: 05/20/2020 PCP: [name], FNP  Brief Narrative:   55 year old white female from home HTN, HLD, depression with psychosis NIDDM complicated by diabetic neuropathy severe cirrhosis of liver Lap chole 08/04/2019-difficult case-backwall of gallbladder left in place-found to have a contained bile leak status post ERCP sphincterotomy and stent 08/07/2019 followed by [facility]-was discharged on Augmentin  Subsequently 11/07/2019 underwent ERCP stent was exchanged in common bile duct with a new stent upsized 10 French plastic stent  In the interim was admitted at [facility] 03/11/2020 found to have E. coli in urine blood cultures 1 out of 4 bottles E. coli was treated with Levaquin Found to have RIb # 8th rib  Underwent a final repeat ERCP 12/20 with persistent bile leak clogged stent removed metal stent placed Developed on 12/21 progressive worsening R LQ pain diaphoresis chills-seen at GI clinic 12/27 and she was sent to [facility] long ED for imaging and further work-up  CT abdomen pelvis = multilocular fluid collection 4.6 X2.6 biloma/postprocedural seroma-GI consulted Zosyn started IR consulted and a drain was placed without resolution of abdominal pain-laxatives were also started as she was significantly constipated  MRCP performed showing persistent leak  ERCP as below  still with significant pain   Assessment & Plan:   Principal Problem:   Biloma Active Problems:   Insulin dependent type 2 diabetes mellitus ([place])   Hyperlipidemia associated with type 2 diabetes mellitus ([place])   Hypertension associated with diabetes ([place])   Other cirrhosis of liver ([place])   Bile leak   Abdominal pain   Malnutrition of moderate degree   Migration of biliary stent  Biloma versus postop seroma status post lap cholecystectomy Percutaneous drain 10 French gallbladder fossa drain 12/30 CT demonstrating choledocho fistula?  Persistent leak MRCP 05/25/2020 shows migration of covered metal stent  ERCP 1/3 removal metal stent replacement plastic stent LFTs lipase normal Antibiotics adjusted to Unasyn -Defer advancement diet to gastroenterology Toradol 30 every 6  moderate pain-OxyIR 10 every 6 as needed morphine 2 to 4 mg every 2 severe pain--- attempt to wean as tolerated IR drain can be followed in the outpatient setting and when less than 50 cc/h can be removed in the outpatient setting per IR Enterococcus and bile drain Await cultures probably before adjusting Unasyn downwards or narrowing Constipation for several days-seems chronic Passing stool with mag citrate Follow-up with bowel regimen MiraLAX scheduled  daily but hold if diarrhea Insulin-dependent DM TY 2 with severe neuropathy Diet as above-sugars 1 50-1 73 today D5 changed to NS 50 cc/H Holding Basaglar 38 Metformin 1000 at bedtime and Synjardy for now Cirrhosis Secondary to NAFLD?-Outpatient follow-up with GI HTN  Lisinopril 20 prazosin consider resumption on discharge Outpatient further adjustment Anemia chronic disease  Currently stable Depression anxiety  Continue Topamax 200 twice daily, vortioxetine 20 Seroquel iii/day Rexulti 2 at bedtime and [name] 20 at bedtime  DVT prophylaxis: SCD Code Status: Full Family Communication: Discussed with husband on 05/27/2020 Seems to understand planning is dependent, ERCP Disposition:  Status is: Inpatient  Remains inpatient appropriate because:Hemodynamically unstable and Ongoing active pain requiring inpatient pain management   Dispo: The patient is from: Home              Anticipated d/c is to: Home              Anticipated d/c date is: 3 days  Patient currently is not medically stable to d/c.  Consultants:   GI  IR  Procedures: None  Gallbladder fossa drain 05/23/2020  ERCP 1/3 Impression:               - Prior sphincterotomy.                           - Metal biliary  stent partially migrated into the                            duodenum.                           - One stent was removed from the biliary tree and                            duodenum.                           - The biliary tree was swept and sludge was found.                           - No bile leak demonstrated.                           - One plastic stent was placed into the common bile                            duct.   Antimicrobials: None   Subjective:  Increasing amounts of pain necessitating IV pain meds No chest pain no fever Drainage has significantly picked up from gallbladder fossa drain and they drained it x1 already today   Objective: Vitals:   05/27/20 1350 05/27/20 1429 05/27/20 2108 05/28/20 0435  BP: 132/80 135/76 124/75 99/62  Pulse: 88 91 85 78  Resp: _0 Temp:  97.9 F (36.6 C)  97.6 F (36.4 C)  TempSrc:  Oral  Oral  SpO2: 93% 95% 93% 91%  Weight:      Height:        Intake/Output Summary (Last 24 hours) at 05/28/2020 1057 Last data filed at 05/28/2020 0816 Gross per 24 hour  Intake 810.41 ml  Output 125 ml  Net 685.41 ml   Filed Weights   05/21/20 1815 05/27/20 1149  Weight: 72.4 kg 72.4 kg    Examination:  EOMI NCAT  tender in right upper quadrant and epigastrium with a drain in place-drainage has picked up significantly S1-S2 no murmur Chest clear no added sound No lower extremity edema No focal deficits  Data Reviewed: I have personally reviewed following labs and imaging studies  White count 6.1 hemoglobin 9.9 BUNs/creatinine 13/0.5 lipase 28 alk phos 139  Radiology Studies: DG ERCP  Result Date: 05/27/2020 CLINICAL DATA:  Biliary stent manipulation EXAM: ERCP TECHNIQUE: Multiple spot images obtained with the fluoroscopic device and submitted for interpretation post-procedure. FLUOROSCOPY TIME:  Fluoroscopy Time:  2 minutes 51 seconds COMPARISON:  MRCP 05/25/2020 FINDINGS: Twenty-one images are submitted for review. The  images demonstrate a flexible duodenal scope in the descending duodenum. A percutaneous drainage catheter is present in the right upper quadrant. A metallic biliary stents is  also present in the common bile duct. Subsequent images demonstrate removal of the biliary stent and cannulation of the intrahepatic ducts with cholangiography. Subsequently, balloon sweeping of the common duct is performed followed by placement of a plastic biliary stent. IMPRESSION: 1. Removal of existing metallic biliary stent, balloon sweeping of the bile duct and placement of a plastic biliary stent. These images were submitted for radiologic interpretation only. Please see the procedural report for the amount of contrast and the fluoroscopy time utilized. Electronically Signed   By: Jacqulynn Cadet M.D.   On: 05/27/2020 13:36     Scheduled Meds: . brexpiprazole  2 mg Oral QHS  . feeding supplement  237 mL Oral BID BM  . insulin aspart  0-9 Units Subcutaneous Q4H  . pantoprazole  40 mg Oral Daily  . polyethylene glycol  17 g Oral Daily  . pregabalin  100 mg Oral TID  . QUEtiapine  800 mg Oral QHS  . saccharomyces boulardii  250 mg Oral BID  . sodium chloride flush  5 mL Intracatheter Q8H  . topiramate  200 mg Oral BID  . vortioxetine HBr  20 mg Oral QHS  . ziprasidone  20 mg Oral QHS   Continuous Infusions: . sodium chloride 50 mL/hr at 05/28/20 0930  . ampicillin-sulbactam (UNASYN) IV 3 g (05/28/20 0942)     LOS: 7 days    Time spent:  [place], MD [facility] To contact the attending provider between 7A-7P or the covering provider during after hours 7P-7A, please log into the web site [email or web address] and access using universal [place] password for that web site. If you do not have the password, please call the hospital operator.  05/28/2020, 10:57 AM

## 2020-05-28 NOTE — Telephone Encounter (Signed)
Patient has been scheduled for a follow up with [name], NP on Tuesday, 07/02/2020 at 10:30 AM. Will mail letter with appointment information.  Recall in epic for repeat ERCP in 3 months.

## 2020-05-29 ENCOUNTER — Encounter (HOSPITAL_COMMUNITY): Payer: Self-pay | Admitting: Gastroenterology

## 2020-05-29 ENCOUNTER — Telehealth: Payer: Self-pay | Admitting: Internal Medicine

## 2020-05-29 DIAGNOSIS — Z794 Long term (current) use of insulin: Secondary | ICD-10-CM

## 2020-05-29 DIAGNOSIS — E119 Type 2 diabetes mellitus without complications: Secondary | ICD-10-CM

## 2020-05-29 DIAGNOSIS — T85520D Displacement of bile duct prosthesis, subsequent encounter: Secondary | ICD-10-CM | POA: Diagnosis not present

## 2020-05-29 DIAGNOSIS — K810 Acute cholecystitis: Secondary | ICD-10-CM | POA: Diagnosis not present

## 2020-05-29 DIAGNOSIS — K651 Peritoneal abscess: Secondary | ICD-10-CM

## 2020-05-29 DIAGNOSIS — K668 Other specified disorders of peritoneum: Secondary | ICD-10-CM | POA: Diagnosis not present

## 2020-05-29 DIAGNOSIS — K5900 Constipation, unspecified: Secondary | ICD-10-CM

## 2020-05-29 LAB — CBC WITH DIFFERENTIAL/PLATELET
Abs Immature Granulocytes: 0.02 10*3/uL (ref 0.00–0.07)
Basophils Absolute: 0 10*3/uL (ref 0.0–0.1)
Basophils Relative: 1 %
Eosinophils Absolute: 0.2 10*3/uL (ref 0.0–0.5)
Eosinophils Relative: 4 %
HCT: 35.4 % — ABNORMAL LOW (ref 36.0–46.0)
Hemoglobin: 10.3 g/dL — ABNORMAL LOW (ref 12.0–15.0)
Immature Granulocytes: 0 %
Lymphocytes Relative: 32 %
Lymphs Abs: 1.6 10*3/uL (ref 0.7–4.0)
MCH: 25.8 pg — ABNORMAL LOW (ref 26.0–34.0)
MCHC: 29.1 g/dL — ABNORMAL LOW (ref 30.0–36.0)
MCV: 88.5 fL (ref 80.0–100.0)
Monocytes Absolute: 0.3 10*3/uL (ref 0.1–1.0)
Monocytes Relative: 7 %
Neutro Abs: 2.8 10*3/uL (ref 1.7–7.7)
Neutrophils Relative %: 56 %
Platelets: 227 10*3/uL (ref 150–400)
RBC: 4 MIL/uL (ref 3.87–5.11)
RDW: 15.9 % — ABNORMAL HIGH (ref 11.5–15.5)
WBC: 4.9 10*3/uL (ref 4.0–10.5)
nRBC: 0 % (ref 0.0–0.2)

## 2020-05-29 LAB — COMPREHENSIVE METABOLIC PANEL
ALT: 30 U/L (ref 0–44)
AST: 44 U/L — ABNORMAL HIGH (ref 15–41)
Albumin: 3.3 g/dL — ABNORMAL LOW (ref 3.5–5.0)
Alkaline Phosphatase: 125 U/L (ref 38–126)
Anion gap: 10 (ref 5–15)
BUN: 15 mg/dL (ref 6–20)
CO2: 23 mmol/L (ref 22–32)
Calcium: 8.8 mg/dL — ABNORMAL LOW (ref 8.9–10.3)
Chloride: 108 mmol/L (ref 98–111)
Creatinine, Ser: 0.64 mg/dL (ref 0.44–1.00)
GFR, Estimated: >60 mL/min (ref >60)
Glucose, Bld: 228 mg/dL — ABNORMAL HIGH (ref 70–99)
Potassium: 4 mmol/L (ref 3.5–5.1)
Sodium: 141 mmol/L (ref 135–145)
Total Bilirubin: 0.2 mg/dL — ABNORMAL LOW (ref 0.3–1.2)
Total Protein: 6.8 g/dL (ref 6.5–8.1)

## 2020-05-29 LAB — GLUCOSE, CAPILLARY
Glucose-Capillary: 153 mg/dL — ABNORMAL HIGH (ref 70–99)
Glucose-Capillary: 171 mg/dL — ABNORMAL HIGH (ref 70–99)
Glucose-Capillary: 216 mg/dL — ABNORMAL HIGH (ref 70–99)
Glucose-Capillary: 251 mg/dL — ABNORMAL HIGH (ref 70–99)
Glucose-Capillary: 254 mg/dL — ABNORMAL HIGH (ref 70–99)
Glucose-Capillary: 317 mg/dL — ABNORMAL HIGH (ref 70–99)

## 2020-05-29 MED ORDER — ENSURE ENLIVE PO LIQD
237.0000 mL | Freq: Three times a day (TID) | ORAL | Status: DC
  Administered 2020-05-29 (×3): 237 mL via ORAL

## 2020-05-29 MED ORDER — SORBITOL 70 % SOLN
960.0000 mL | TOPICAL_OIL | Freq: Once | ORAL | Status: AC
  Administered 2020-05-29: 960 mL via RECTAL
  Filled 2020-05-29: qty 473

## 2020-05-29 MED ORDER — ADULT MULTIVITAMIN W/MINERALS CH
1.0000 | ORAL_TABLET | Freq: Every day | ORAL | Status: DC
  Administered 2020-05-29 – 2020-05-30 (×2): 1 via ORAL
  Filled 2020-05-29 (×2): qty 1

## 2020-05-29 MED ORDER — AMOXICILLIN-POT CLAVULANATE 875-125 MG PO TABS
1.0000 | ORAL_TABLET | Freq: Two times a day (BID) | ORAL | Status: DC
  Administered 2020-05-29 – 2020-05-30 (×2): 1 via ORAL
  Filled 2020-05-29 (×2): qty 1

## 2020-05-29 NOTE — Consult Note (Addendum)
[facility]    Date of Admission:  05/20/2020     Reason for Consult: infected biloma    Referring Provider: Cordelia Poche    Lines:  Peripheral ivs  Abx: 1/02-c amp-sulbactam  12/28 and 12/30 fluconazole 12/27-1/02 pip-tazo 12/28 nitrofurantoin        Assessment: 55 y.o. female pmh  liver cirrhosis (presumed nash), insulin-dependent type 2 diabetes, hypertension, hyperlipidemia, anemia of chronic disease, depression/anxiety, recent lap chole 07/2019 for chronic cholecystitis complicated by persistent bile leak s/p biliary stent, admitted 12/27 for right sided abd/flank pain found to have infected biloma  No evidence peritonitis Patient is now s/p perc drain placement on 12/30; cx rare efaecalis. No sign of sepsis She has what appears to be assymptomatic bacteriuria On 1/03 had ercp with replacement of migrated stent  At this point she appears to have source control,  However this is not cholecystitis per say but rather an "abscess in the gallbladder fossa." Would finish 14 day course abx. Oral abx would be fine. She'll need close f/u and repeat imaging with GI outpatient  Plan: 1. Will start augmentin 875 mg po bid; stop amp-sulb 2. Plan for augmentin to go until 1/17 (14 days from stent placement) 3. F/u GI outpatient for assessment of fluid collection/drain removal 4.   ID will sign off    Can f/u ID clinic in 2 weeks   Appointment date and time: 1/17 @ 3pm with Janene Madeira Will arrange abd pelv ct prior to clinic visit     [facility] address: [facility] Located in: [facility] Address: [id number], [place], [place] [address] Phone: [phone]   Principal Problem:   Biloma Active Problems:   Insulin dependent type 2 diabetes mellitus ([place])   Hyperlipidemia associated with type 2 diabetes mellitus ([place])   Hypertension associated with diabetes ([place])   Other cirrhosis of  liver ([place])   Bile leak   Abdominal pain   Malnutrition of moderate degree   Migration of biliary stent   Scheduled Meds: . brexpiprazole  2 mg Oral QHS  . feeding supplement  237 mL Oral TID BM  . insulin aspart  0-9 Units Subcutaneous Q4H  . multivitamin with minerals  1 tablet Oral Daily  . pantoprazole  40 mg Oral Daily  . polyethylene glycol  17 g Oral BID  . pregabalin  100 mg Oral TID  . QUEtiapine  800 mg Oral QHS  . saccharomyces boulardii  250 mg Oral BID  . senna-docusate  1 tablet Oral QHS  . sodium chloride flush  5 mL Intracatheter Q8H  . topiramate  200 mg Oral BID  . vortioxetine HBr  20 mg Oral QHS  . ziprasidone  20 mg Oral QHS   Continuous Infusions: . sodium chloride 50 mL/hr at 05/29/20 0325  . ampicillin-sulbactam (UNASYN) IV 3 g (05/29/20 1512)   PRN Meds:.acetaminophen **OR** acetaminophen, albuterol, ketorolac, morphine injection, ondansetron **OR** ondansetron (ZOFRAN) IV, oxyCODONE, promethazine  HPI: Kim Hicks is a 55 y.o. female pmh  liver cirrhosis, insulin-dependent type 2 diabetes, hypertension, hyperlipidemia, anemia of chronic disease, depression/anxiety, recent lap chole 07/2019 for chronic cholecystitis complicated by persistent bile leak s/p biliary stent, admitted 12/27 for right sided abd/flank pain found to have infected biloma  Review of chart: S/p subtotal cholecystectomy 05/4968 complicated by cystic duct stump leak ERCP 08/07/2019 with placement initial stent into CBD, along with sphinterotomy ERCP  11/07/2019 for stent exchange; persistent bile leak noted ERCP 05/13/2020 for persistent bile leak; previous stent clogged/removed and new metal stent placed in CBD  Post 12/20 procedure, patient developed progressive right flank/abd pain. She denies fever/chill, but endorsed nausea/poor appetite. Denies diarrhea. Denies increased abd girth or weight gain/legs swelling  Seen by GI in clinic 05/20/2020. Previsit labs showed alk phos 244, AST  44, ALT 41, total bilirubin 0.3, lipase 101, WBC 6.2, hemoglobin 9.6. She was referred from gi clinic for admission to r/o complication of bile leak vs pancreatitis vs others   Hospital course: 12/27 admission: afebrile; hds Urinalysis positive nitrites, 0-5 RBC/hpf, 21-50 WBC, many bacteria on microscopy ucx ultimately klebsiella Wbc 6.2 CT abdomen/pelvis with contrast showed a multilocular fluid collection within the gallbladder bed measuring 4.6 x 2.6 cm which could be due to biloma or postprocedural seroma.  No definite evidence of pancreatitis was seen.  Findings suggestive of cirrhosis and portal hypertension also noted. Started empirically on piptazo for concern biliary sepsis 12/28 hida scan shows no evidence bile leak 12/30 s/p IR placement percutaneous drain catheter. The content does show bile and suggestive of persistent bile leak 01/03 ercp to remove metal stent which had migrated and placement of new plastic stent for ongoing bile leak  She never had fever here. And her wbc has been around 6's. The perc biliary drain is draining well  She is ready for disposition per primary team  She is feeling much better just minimal pain now. Eating well. No diarrhea. She said there was incidental rib fx noted on mri. No previous trauma  Review of Systems: Review of Systems  Psychiatric/Behavioral: Positive for substance abuse.   Other ROS negative  Past Medical History:  Diagnosis Date  . Anxiety   . Cataract   . Cirrhosis of liver ([place])   . Depression   . Diabetes mellitus without complication ([place])   . Dysrhythmia   . Edema   . Family history of adverse reaction to anesthesia    mother had trouble waking up after surgery  . Foot fracture, left   . Headache    MIGRAINES  . Hyperlipidemia   . Hypertension   . Neuromuscular disorder ([place])    NEUROPATHY-both hands and feet  . Palpitations   . Wheezing     Social History   Tobacco Use  . Smoking status: Never  Smoker  . Smokeless tobacco: Never Used  Vaping Use  . Vaping Use: Never used  Substance Use Topics  . Alcohol use: Never  . Drug use: Never    Family History  Problem Relation Age of Onset  . Diabetes Mother   . Heart failure Mother   . Depression Mother   . Colon cancer Neg Hx   . Stomach cancer Neg Hx   . Esophageal cancer Neg Hx   . Pancreatic cancer Neg Hx    Allergies  Allergen Reactions  . Talwin [Pentazocine]     Turned blue around lips and face, rash all over body    OBJECTIVE: Blood pressure 133/72, pulse 79, temperature 98.5 F (36.9 C), temperature source Oral, resp. rate 19, height _0  (1.626 m), weight 72.4 kg, SpO2 91 %.  Physical Exam Constitutional:      Appearance: She is obese.  HENT:     Head: Normocephalic.     Mouth/Throat:     Mouth: Mucous membranes are moist.     Pharynx: Oropharynx is clear.  Eyes:     Extraocular Movements:  Extraocular movements intact.     Pupils: Pupils are equal, round, and reactive to light.  Cardiovascular:     Rate and Rhythm: Normal rate and regular rhythm.     Pulses: Normal pulses.     Heart sounds: Normal heart sounds.  Pulmonary:     Effort: Pulmonary effort is normal.     Breath sounds: Normal breath sounds.  Abdominal:     Palpations: Abdomen is soft.     Tenderness: There is abdominal tenderness.     Comments: Biliary drain draining yellow/green clear liquid. Mild right upper quadrant tenderness. No rebound/guarding  Musculoskeletal:        General: Normal range of motion.     Cervical back: Neck supple.  Skin:    General: Skin is warm.  Neurological:     General: No focal deficit present.     Mental Status: She is alert and oriented to person, place, and time.  Psychiatric:        Mood and Affect: Mood normal.        Thought Content: Thought content normal.     Lab Results Lab Results  Component Value Date   WBC 4.9 05/29/2020   HGB 10.3 (L) 05/29/2020   HCT 35.4 (L) 05/29/2020   MCV  88.5 05/29/2020   PLT 227 05/29/2020    Lab Results  Component Value Date   CREATININE 0.64 05/29/2020   BUN 15 05/29/2020   NA 141 05/29/2020   K 4.0 05/29/2020   CL 108 05/29/2020   CO2 23 05/29/2020    Lab Results  Component Value Date   ALT 30 05/29/2020   AST 44 (H) 05/29/2020   ALKPHOS 125 05/29/2020   BILITOT 0.2 (L) 05/29/2020     Microbiology: Recent Results (from the past 240 hour(s))  Urine culture     Status: Abnormal   Collection Time: 05/20/20  4:56 PM   Specimen: Urine, Random  Result Value Ref Range Status   Specimen Description   Final    URINE, RANDOM Performed at [facility] [address] [place], [place] [address]    Special Requests   Final    NONE Performed at [facility], [place] [address], [place], [place] [address]    Culture >=100,000 COLONIES/mL KLEBSIELLA PNEUMONIAE (A)  Final   Report Status 05/23/2020 FINAL  Final   Organism ID, Bacteria KLEBSIELLA PNEUMONIAE (A)  Final      Susceptibility   Klebsiella pneumoniae - MIC*    AMPICILLIN RESISTANT Resistant     CEFAZOLIN <=4 SENSITIVE Sensitive     CEFEPIME <=0.12 SENSITIVE Sensitive     CEFTRIAXONE <=0.25 SENSITIVE Sensitive     CIPROFLOXACIN <=0.25 SENSITIVE Sensitive     GENTAMICIN <=1 SENSITIVE Sensitive     IMIPENEM 0.5 SENSITIVE Sensitive     NITROFURANTOIN 32 SENSITIVE Sensitive     TRIMETH/SULFA <=20 SENSITIVE Sensitive     AMPICILLIN/SULBACTAM <=2 SENSITIVE Sensitive     PIP/TAZO <=4 SENSITIVE Sensitive     * >=100,000 COLONIES/mL KLEBSIELLA PNEUMONIAE  Resp Panel by RT-PCR (Flu A&B, Covid) Nasopharyngeal Swab     Status: None   Collection Time: 05/20/20  9:23 PM   Specimen: Nasopharyngeal Swab; Nasopharyngeal(NP) swabs in vial transport medium  Result Value Ref Range Status   SARS Coronavirus 2 by RT PCR NEGATIVE NEGATIVE Final    Comment: (NOTE) SARS-CoV-2 target nucleic acids are NOT DETECTED.  The SARS-CoV-2 RNA is generally  detectable in upper respiratory specimens during  the acute phase of infection. The lowest concentration of SARS-CoV-2 viral copies this assay can detect is 138 copies/mL. A negative result does not preclude SARS-Cov-2 infection and should not be used as the sole basis for treatment or other patient management decisions. A negative result may occur with  improper specimen collection/handling, submission of specimen other than nasopharyngeal swab, presence of viral mutation(s) within the areas targeted by this assay, and inadequate number of viral copies(<138 copies/mL). A negative result must be combined with clinical observations, patient history, and epidemiological information. The expected result is Negative.  Fact Sheet for Patients:  EntrepreneurPulse.com.au  Fact Sheet for Healthcare Providers:  IncredibleEmployment.be  This test is no t yet approved or cleared by the [place] FDA and  has been authorized for detection and/or diagnosis of SARS-CoV-2 by FDA under an Emergency Use Authorization (EUA). This EUA will remain  in effect (meaning this test can be used) for the duration of the COVID-19 declaration under Section 564(b)(1) of the Act, 21 U.S.C.section 360bbb-3(b)(1), unless the authorization is terminated  or revoked sooner.       Influenza A by PCR NEGATIVE NEGATIVE Final   Influenza B by PCR NEGATIVE NEGATIVE Final    Comment: (NOTE) The Xpert Xpress SARS-CoV-2/FLU/RSV plus assay is intended as an aid in the diagnosis of influenza from Nasopharyngeal swab specimens and should not be used as a sole basis for treatment. Nasal washings and aspirates are unacceptable for Xpert Xpress SARS-CoV-2/FLU/RSV testing.  Fact Sheet for Patients: EntrepreneurPulse.com.au  Fact Sheet for Healthcare Providers: IncredibleEmployment.be  This test is not yet approved or cleared by the [place] FDA  and has been authorized for detection and/or diagnosis of SARS-CoV-2 by FDA under an Emergency Use Authorization (EUA). This EUA will remain in effect (meaning this test can be used) for the duration of the COVID-19 declaration under Section 564(b)(1) of the Act, 21 U.S.C. section 360bbb-3(b)(1), unless the authorization is terminated or revoked.  Performed at [facility], [place] [address], [place], [place] [address]   Aerobic/Anaerobic Culture (surgical/deep wound)     Status: None   Collection Time: 05/23/20  3:57 PM   Specimen: Abscess  Result Value Ref Range Status   Specimen Description   Final    ABSCESS GALLBLADDER FOSSA FLUID Performed at [facility] [address] [place], [place] [address]    Special Requests   Final    Normal Performed at [facility], [place] [address] [place], [place] [address]    Gram Stain   Final    RARE WBC PRESENT,BOTH PMN AND MONONUCLEAR NO ORGANISMS SEEN    Culture   Final    RARE ENTEROCOCCUS FAECALIS NO ANAEROBES ISOLATED Performed at [facility], [address], [place], [place] [address]    Report Status 05/28/2020 FINAL  Final   Organism ID, Bacteria ENTEROCOCCUS FAECALIS  Final      Susceptibility   Enterococcus faecalis - MIC*    AMPICILLIN <=2 SENSITIVE Sensitive     VANCOMYCIN 1 SENSITIVE Sensitive     GENTAMICIN SYNERGY SENSITIVE Sensitive     * RARE ENTEROCOCCUS FAECALIS   Serology: hiv screen negative  Micro: 12/27 ucx kleb pna (r amp, pansensitive otherwise) 12/30 perc drain fluid rare e faecalis  Imaging: 12/27 ct abd/pelv with contrast I personally reviewed and noted fluid collection around previously where gallbladder bed was. I also noted hyperintense tubular structure ending in the duodenum from cbd  Multilocular fluid collection  within the gallbladder bed measuring 4.6 x 2.6 cm which could be due to biloma, or postprocedural seroma. Mild  inflammatory changes seen within the porta hepatis No definite evidence of pancreatitis Findings suggestive of cirrhosis and portal hypertension  12/28 hida scan No evidence bile leak  1/01 mra 1. Status post cholecystectomy with percutaneous drainage catheter in the gallbladder fossa, without residual fluid collection around the catheter tip. 2. Hepatic cirrhosis with developing hepatic fibrosis, most confluent in the right lobe of the liver.  1/03 ercp 1. Removal of existing metallic biliary stent, balloon sweeping of the bile duct and placement of a plastic biliary stent.  Jabier Mutton, [place] [place] [phone] pager    05/29/2020, 4:04 PM

## 2020-05-29 NOTE — Progress Notes (Signed)
PROGRESS NOTE    SINDY MCCUNE  KKX:[id number] DOB: 01-Feb-1966 DOA: 05/20/2020 PCP: [name], FNP   Brief Narrative: Kim Hicks is a 55 y.o. female with HTN, HLD, depression with psychosis NIDDM complicated by diabetic neuropathy severe cirrhosis of liver. Patient presented secondary to abdominal and flank pain found to have infected bilioma requiring replacement of biliary stent and antibiotic therapy.   Assessment & Plan:   Principal Problem:   Biloma Active Problems:   Insulin dependent type 2 diabetes mellitus ([place])   Hyperlipidemia associated with type 2 diabetes mellitus ([place])   Hypertension associated with diabetes ([place])   Other cirrhosis of liver ([facility])   Bile leak   Abdominal pain   Malnutrition of moderate degree   Migration of biliary stent   Abscess of gallbladder   Biloma Patient with complicated history of recurrent leak. Multiple stents placed. Most recently, GI placed plastic stent on 1/3 via ERCP. -Continue Oxycodone prn  Enterococcal infection Biliary source. Started on Zosyn and transitioned to Unasyn -ID consult for antibiotic choice and duration  Constipation -Continue Miralax, Senokot-S -Add SMOG enema  Diabetes mellitus, type 2 -Continue SSI  Cirrhosis Recent MRCP significant for cirrhosis with developing hepatic fibrosis.  Primary hypertension On lisinopril as an outpatient.  Anemia of chronic disease Stable.  Depression Anxiety -Continue Topamax, vortioxetine, Seroquel, Rexulti, [name]   DVT prophylaxis: SCDs Code Status:   Code Status: Full Code Family Communication: None at bedside Disposition Plan: Discharge likely in 24 hours if pain control improved   Consultants:   Gastroenterology  Interventional radiology  Procedures:   ERCP (05/27/2020) Impression:       - Prior sphincterotomy.                           - Metal biliary stent partially migrated into the                            duodenum.                            - One stent was removed from the biliary tree and                            duodenum.                           - The biliary tree was swept and sludge was found.                           - No bile leak demonstrated.                           - One plastic stent was placed into the common bile                            duct.  Recommendation:         - Return patient to hospital ward for ongoing care.                           - Observe patient's clinical course following  today's ERCP with therapeutic intervention.                           - Return to endoscopist for stent removal at ERCP                            in 2-3 months.  Antimicrobials:  Zosyn  Nitrofurantoin  Fluconazole  Ceftriaxone  Unasyn    Subjective: Some improvement in pain but still with pain this morning.  Objective: Vitals:   05/28/20 0435 05/28/20 1422 05/28/20 2014 05/29/20 1345  BP: 99/62 121/64 105/66 133/72  Pulse: 78 86 81 79  Resp: 18 (!) 21 14 19   Temp: 97.6 F (36.4 C) 98.2 F (36.8 C) 98.6 F (37 C) 98.5 F (36.9 C)  TempSrc: Oral Oral Oral Oral  SpO2: 91% 95% 95% 91%  Weight:      Height:        Intake/Output Summary (Last 24 hours) at 05/29/2020 1850 Last data filed at 05/29/2020 1819 Gross per 24 hour  Intake 2064.86 ml  Output 315 ml  Net 1749.86 ml   Filed Weights   05/21/20 1815 05/27/20 1149  Weight: 72.4 kg 72.4 kg    Examination:  General exam: Appears calm and comfortable Respiratory system: Clear to auscultation. Respiratory effort normal. Cardiovascular system: S1 & S2 heard, RRR. No murmurs, rubs, gallops or clicks. Gastrointestinal system: Abdomen is full, soft and tender in RUQ. No organomegaly or masses felt. Normal bowel sounds heard. Central nervous system: Alert and oriented. No focal neurological deficits. Musculoskeletal: No calf tenderness Skin: No cyanosis. No rashes Psychiatry: Judgement and insight appear  normal. Mood & affect appropriate.     Data Reviewed: I have personally reviewed following labs and imaging studies  CBC Lab Results  Component Value Date   WBC 4.9 05/29/2020   RBC 4.00 05/29/2020   HGB 10.3 (L) 05/29/2020   HCT 35.4 (L) 05/29/2020   MCV 88.5 05/29/2020   MCH 25.8 (L) 05/29/2020   PLT 227 05/29/2020   MCHC 29.1 (L) 05/29/2020   RDW 15.9 (H) 05/29/2020   LYMPHSABS 1.6 05/29/2020   MONOABS 0.3 05/29/2020   EOSABS 0.2 05/29/2020   BASOSABS 0.0 76/81/1572     Last metabolic panel Lab Results  Component Value Date   NA 141 05/29/2020   K 4.0 05/29/2020   CL 108 05/29/2020   CO2 23 05/29/2020   BUN 15 05/29/2020   CREATININE 0.64 05/29/2020   GLUCOSE 228 (H) 05/29/2020   GFRNONAA >60 05/29/2020   GFRAA >60 02/16/2020   CALCIUM 8.8 (L) 05/29/2020   PHOS 3.4 08/17/2007   PROT 6.8 05/29/2020   ALBUMIN 3.3 (L) 05/29/2020   LABGLOB 3.0 05/16/2019   BILITOT 0.2 (L) 05/29/2020   ALKPHOS 125 05/29/2020   AST 44 (H) 05/29/2020   ALT 30 05/29/2020   ANIONGAP 10 05/29/2020    CBG (last 3)  Recent Labs    05/29/20 0732 05/29/20 1143 05/29/20 1609  GLUCAP 153* 317* 251*     GFR: Estimated Creatinine Clearance: 77.5 mL/min (by C-G formula based on SCr of 0.64 mg/dL).  Coagulation Profile: No results for input(s): INR, PROTIME in the last 168 hours.  Recent Results (from the past 240 hour(s))  Urine culture     Status: Abnormal   Collection Time: 05/20/20  4:56 PM   Specimen: Urine, Random  Result Value Ref Range Status  Specimen Description   Final    URINE, RANDOM Performed at [facility] [place] [address], [place], [place] [address]    Special Requests   Final    NONE Performed at [facility], [place] [address] [place], [place] [address]    Culture >=100,000 COLONIES/mL KLEBSIELLA PNEUMONIAE (A)  Final   Report Status 05/23/2020 FINAL  Final   Organism ID, Bacteria KLEBSIELLA PNEUMONIAE (A)  Final       Susceptibility   Klebsiella pneumoniae - MIC*    AMPICILLIN RESISTANT Resistant     CEFAZOLIN <=4 SENSITIVE Sensitive     CEFEPIME <=0.12 SENSITIVE Sensitive     CEFTRIAXONE <=0.25 SENSITIVE Sensitive     CIPROFLOXACIN <=0.25 SENSITIVE Sensitive     GENTAMICIN <=1 SENSITIVE Sensitive     IMIPENEM 0.5 SENSITIVE Sensitive     NITROFURANTOIN 32 SENSITIVE Sensitive     TRIMETH/SULFA <=20 SENSITIVE Sensitive     AMPICILLIN/SULBACTAM <=2 SENSITIVE Sensitive     PIP/TAZO <=4 SENSITIVE Sensitive     * >=100,000 COLONIES/mL KLEBSIELLA PNEUMONIAE  Resp Panel by RT-PCR (Flu A&B, Covid) Nasopharyngeal Swab     Status: None   Collection Time: 05/20/20  9:23 PM   Specimen: Nasopharyngeal Swab; Nasopharyngeal(NP) swabs in vial transport medium  Result Value Ref Range Status   SARS Coronavirus 2 by RT PCR NEGATIVE NEGATIVE Final    Comment: (NOTE) SARS-CoV-2 target nucleic acids are NOT DETECTED.  The SARS-CoV-2 RNA is generally detectable in upper respiratory specimens during the acute phase of infection. The lowest concentration of SARS-CoV-2 viral copies this assay can detect is 138 copies/mL. A negative result does not preclude SARS-Cov-2 infection and should not be used as the sole basis for treatment or other patient management decisions. A negative result may occur with  improper specimen collection/handling, submission of specimen other than nasopharyngeal swab, presence of viral mutation(s) within the areas targeted by this assay, and inadequate number of viral copies(<138 copies/mL). A negative result must be combined with clinical observations, patient history, and epidemiological information. The expected result is Negative.  Fact Sheet for Patients:  EntrepreneurPulse.com.au  Fact Sheet for Healthcare Providers:  IncredibleEmployment.be  This test is no t yet approved or cleared by the [place] FDA and  has been authorized for detection  and/or diagnosis of SARS-CoV-2 by FDA under an Emergency Use Authorization (EUA). This EUA will remain  in effect (meaning this test can be used) for the duration of the COVID-19 declaration under Section 564(b)(1) of the Act, 21 U.S.C.section 360bbb-3(b)(1), unless the authorization is terminated  or revoked sooner.       Influenza A by PCR NEGATIVE NEGATIVE Final   Influenza B by PCR NEGATIVE NEGATIVE Final    Comment: (NOTE) The Xpert Xpress SARS-CoV-2/FLU/RSV plus assay is intended as an aid in the diagnosis of influenza from Nasopharyngeal swab specimens and should not be used as a sole basis for treatment. Nasal washings and aspirates are unacceptable for Xpert Xpress SARS-CoV-2/FLU/RSV testing.  Fact Sheet for Patients: EntrepreneurPulse.com.au  Fact Sheet for Healthcare Providers: IncredibleEmployment.be  This test is not yet approved or cleared by the [place] FDA and has been authorized for detection and/or diagnosis of SARS-CoV-2 by FDA under an Emergency Use Authorization (EUA). This EUA will remain in effect (meaning this test can be used) for the duration of the COVID-19 declaration under Section 564(b)(1) of the Act, 21 U.S.C. section 360bbb-3(b)(1), unless the authorization is terminated or revoked.  Performed at [facility]  [place] [address], [place], [place] [address]   Aerobic/Anaerobic Culture (surgical/deep wound)     Status: None   Collection Time: 05/23/20  3:57 PM   Specimen: Abscess  Result Value Ref Range Status   Specimen Description   Final    ABSCESS GALLBLADDER FOSSA FLUID Performed at [facility] [address], [place], [place] [address]    Special Requests   Final    Normal Performed at [facility], [place] [address], [place], [place] [address]    Gram Stain   Final    RARE WBC PRESENT,BOTH PMN AND MONONUCLEAR NO ORGANISMS SEEN    Culture    Final    RARE ENTEROCOCCUS FAECALIS NO ANAEROBES ISOLATED Performed at [facility], [address] [place], [place] [address]    Report Status 05/28/2020 FINAL  Final   Organism ID, Bacteria ENTEROCOCCUS FAECALIS  Final      Susceptibility   Enterococcus faecalis - MIC*    AMPICILLIN <=2 SENSITIVE Sensitive     VANCOMYCIN 1 SENSITIVE Sensitive     GENTAMICIN SYNERGY SENSITIVE Sensitive     * RARE ENTEROCOCCUS FAECALIS        Radiology Studies: DG Ribs Unilateral Right  Result Date: 05/28/2020 CLINICAL DATA:  History of fracture. Complaining of pain around sided gallbladder fossa drain. EXAM: RIGHT RIBS - 2 VIEW COMPARISON:  ERCP 05/27/2020.  CT 05/23/2020. FINDINGS: Right upper chest and ribs incompletely imaged. Nondisplaced right eighth and ninth rib fractures cannot be excluded. No visualized pneumothorax surgical clips right upper quadrant. Right upper quadrant drainage catheter and biliary stent stable position. Bibasilar atelectasis and or scarring. Large amount of stool in a colon suggesting constipation. IMPRESSION: 1. Right upper quadrant drainage catheter and biliary stent in stable position. 2. Nondisplaced right eighth and ninth rib fractures cannot be excluded. 3. Large amount of stool noted throughout the colon. Constipation cannot be excluded. Electronically Signed   By: Marcello Moores  Register   On: 05/28/2020 11:41        Scheduled Meds: . amoxicillin-clavulanate  1 tablet Oral Q12H  . brexpiprazole  2 mg Oral QHS  . feeding supplement  237 mL Oral TID BM  . insulin aspart  0-9 Units Subcutaneous Q4H  . multivitamin with minerals  1 tablet Oral Daily  . pantoprazole  40 mg Oral Daily  . polyethylene glycol  17 g Oral BID  . pregabalin  100 mg Oral TID  . QUEtiapine  800 mg Oral QHS  . saccharomyces boulardii  250 mg Oral BID  . senna-docusate  1 tablet Oral QHS  . sodium chloride flush  5 mL Intracatheter Q8H  . sorbitol, milk of mag, mineral oil, glycerin  (SMOG) enema  960 mL Rectal Once  . topiramate  200 mg Oral BID  . vortioxetine HBr  20 mg Oral QHS  . ziprasidone  20 mg Oral QHS   Continuous Infusions: . sodium chloride 50 mL/hr at 05/29/20 0325     LOS: 8 days     Cordelia Poche, MD [facility] 05/29/2020, 6:50 PM  If 7PM-7AM, please contact night-coverage [email or web address]

## 2020-05-29 NOTE — Progress Notes (Signed)
Referring Physician(s): Gupta,R  Supervising Physician: Aletta Edouard  Patient Status:  [name] - In-pt  Chief Complaint: Right upper abdominal pain   Subjective: Pt without acute changes; cont to have some mild RUQ discomfort; no N/V; also with continued drainage of bilious appearing fluid from abd drain   Allergies: Talwin [pentazocine]  Medications: Prior to Admission medications   Medication Sig Start Date End Date Taking? Authorizing Provider  albuterol (VENTOLIN HFA) 108 (90 Base) MCG/ACT inhaler Inhale 2 puffs into the lungs every 6 (six) hours as needed for shortness of breath or wheezing. 02/27/20  Yes [provider]  brexpiprazole (REXULTI) 2 MG TABS tablet Take 2 mg by mouth at bedtime.   Yes [provider]  calcium carbonate (OSCAL) 1500 (600 Ca) MG TABS tablet Take 600 mg of elemental calcium by mouth daily with breakfast.   Yes [provider]  Empagliflozin-metFORMIN HCl ER (SYNJARDY XR) 25-1000 MG TB24 Take 1 tablet by mouth daily before breakfast.  11/15/18  Yes [provider]  ezetimibe (ZETIA) 10 MG tablet Take 10 mg by mouth daily. 04/25/20  Yes [provider]  ferrous sulfate 325 (65 FE) MG tablet Take 325 mg by mouth daily with breakfast.   Yes [provider]  Insulin Glargine (BASAGLAR KWIKPEN) 100 UNIT/ML Inject 38 Units into the skin at bedtime. 03/20/20  Yes [provider]  LINZESS 290 MCG CAPS capsule Take 290 mcg by mouth every morning. 08/16/19  Yes [provider]  lisinopril (ZESTRIL) 20 MG tablet TAKE 1 TABLET BY MOUTH EVERY DAY Patient taking differently: Take 20 mg by mouth daily. 02/08/20  Yes [name], MD  metFORMIN (GLUCOPHAGE) 1000 MG tablet Take 1,000 mg by mouth at bedtime. 02/27/20  Yes [provider]  Omega-3 Fatty Acids (OMEGA 3 PO) Take 500 mg by mouth daily.    Yes [provider]  prazosin (MINIPRESS) 2 MG capsule Take 4 mg by mouth at  bedtime.   Yes [provider]  pregabalin (LYRICA) 100 MG capsule TAKE 1 CAPSULE BY MOUTH THREE TIMES A DAY Patient taking differently: Take 100 mg by mouth 3 (three) times daily. 02/12/20  Yes [name], NP  QUEtiapine (SEROQUEL) 400 MG tablet Take 800 mg by mouth at bedtime.   Yes [provider]  simvastatin (ZOCOR) 10 MG tablet Take 10 mg by mouth daily before breakfast.  12/13/17  Yes [provider]  topiramate (TOPAMAX) 200 MG tablet Take 200 mg by mouth 2 (two) times daily. 04/12/20  Yes [provider]  traMADol ([name]) 50 MG tablet Take 50 mg by mouth daily as needed for moderate pain.  01/13/19  Yes [provider]  vortioxetine HBr ([name]) 20 MG TABS tablet Take 20 mg by mouth at bedtime.   Yes [provider]  ziprasidone ([name]) 20 MG capsule Take 20 mg by mouth at bedtime.   Yes [provider]  Dulaglutide 4.5 MG/0.5ML SOPN Inject 4 mg into the skin once a week. Monday 12/20/18   [provider]     Vital Signs: BP 105/66 (BP Location: Right Arm)   Pulse 81   Temp 98.6 F (37 C) (Oral)   Resp 14   Ht 5' 4"  (1.626 m)   Wt 159 lb 9.8 oz (72.4 kg)   SpO2 95%   BMI 27.40 kg/m   Physical Exam awake/alert; RUQ drain intact, insertion site ok, mildly tender, OP 200 cc yesterday/ 105 cc so far today  bilious appearing fluid  Imaging: DG Ribs Unilateral Right  Result Date: 05/28/2020 CLINICAL DATA:  History of fracture. Complaining of pain around sided gallbladder fossa drain. EXAM: RIGHT RIBS - 2 VIEW COMPARISON:  ERCP 05/27/2020.  CT 05/23/2020. FINDINGS: Right upper chest and ribs incompletely imaged. Nondisplaced right eighth and ninth rib fractures cannot be excluded. No visualized pneumothorax surgical clips right upper quadrant. Right upper quadrant drainage catheter and biliary stent stable position. Bibasilar atelectasis and or scarring. Large amount of stool in a colon suggesting  constipation. IMPRESSION: 1. Right upper quadrant drainage catheter and biliary stent in stable position. 2. Nondisplaced right eighth and ninth rib fractures cannot be excluded. 3. Large amount of stool noted throughout the colon. Constipation cannot be excluded. Electronically Signed   By: Marcello Moores  Register   On: 05/28/2020 11:41   MR 3D Recon At Scanner  Result Date: 05/25/2020 CLINICAL DATA:  55 year old female with history of right upper quadrant abdominal pain. Prior history of cholecystectomy. Evaluate for possible biliary fistula. EXAM: MRI ABDOMEN WITHOUT AND WITH CONTRAST (INCLUDING MRCP) TECHNIQUE: Multiplanar multisequence MR imaging of the abdomen was performed both before and after the administration of intravenous contrast. Heavily T2-weighted images of the biliary and pancreatic ducts were obtained, and three-dimensional MRCP images were rendered by post processing. CONTRAST:  85m GADAVIST GADOBUTROL 1 MMOL/ML IV SOLN COMPARISON:  No priors. FINDINGS: Lower chest: Linear areas of signal intensity within the lower lobes of the lungs bilaterally, poorly evaluated on today's abdominal MRI, but statistically likely to represent areas of subsegmental atelectasis or scarring. Hepatobiliary: Liver has a slightly shrunken appearance and nodular contour, indicative of underlying cirrhosis. Scattered throughout the liver there are some lace-like regions of T2 hyperintensity which correspond to areas of progressive delayed enhancement on post gadolinium imaging, indicative of developing regions of fibrosis, most confluent in the right lobe of the liver. No suspicious cystic or solid hepatic lesions. No intra or extrahepatic biliary ductal dilatation. Status post cholecystectomy. Percutaneous drain with tip terminating in the gallbladder fossa. No definite fluid collection around the tip of the drainage catheter. Pancreas: No pancreatic mass. No pancreatic ductal dilatation. No pancreatic or peripancreatic  fluid collections or inflammatory changes. Spleen:  Unremarkable. Adrenals/Urinary Tract: Bilateral kidneys and bilateral adrenal glands are normal in appearance. No hydroureteronephrosis in the visualized portions of the abdomen. Stomach/Bowel: Visualized portions are unremarkable. Vascular/Lymphatic: No aneurysm identified in the visualized abdominal vasculature. No lymphadenopathy noted in the abdomen. Other: No significant volume of ascites noted in the visualized portions of the peritoneal cavity. Musculoskeletal: No aggressive appearing osseous lesions are noted in the visualized portions of the abdomen. IMPRESSION: 1. Status post cholecystectomy with percutaneous drainage catheter in the gallbladder fossa, without residual fluid collection around the catheter tip. 2. Hepatic cirrhosis with developing hepatic fibrosis, most confluent in the right lobe of the liver. Electronically Signed   By: DVinnie LangtonM.D.   On: 05/25/2020 15:02   DG ERCP  Result Date: 05/27/2020 CLINICAL DATA:  Biliary stent manipulation EXAM: ERCP TECHNIQUE: Multiple spot images obtained with the fluoroscopic device and submitted for interpretation post-procedure. FLUOROSCOPY TIME:  Fluoroscopy Time:  2 minutes 51 seconds COMPARISON:  MRCP 05/25/2020 FINDINGS: Twenty-one images are submitted for review. The images demonstrate a flexible duodenal scope in the descending duodenum. A percutaneous drainage catheter is present in the right upper quadrant. A metallic biliary stents is also present in the common bile duct. Subsequent images demonstrate removal of the biliary stent and cannulation of the intrahepatic ducts  with cholangiography. Subsequently, balloon sweeping of the common duct is performed followed by placement of a plastic biliary stent. IMPRESSION: 1. Removal of existing metallic biliary stent, balloon sweeping of the bile duct and placement of a plastic biliary stent. These images were submitted for radiologic  interpretation only. Please see the procedural report for the amount of contrast and the fluoroscopy time utilized. Electronically Signed   By: Jacqulynn Cadet M.D.   On: 05/27/2020 13:36   MR ABDOMEN MRCP W WO CONTAST  Result Date: 05/25/2020 CLINICAL DATA:  55 year old female with history of right upper quadrant abdominal pain. Prior history of cholecystectomy. Evaluate for possible biliary fistula. EXAM: MRI ABDOMEN WITHOUT AND WITH CONTRAST (INCLUDING MRCP) TECHNIQUE: Multiplanar multisequence MR imaging of the abdomen was performed both before and after the administration of intravenous contrast. Heavily T2-weighted images of the biliary and pancreatic ducts were obtained, and three-dimensional MRCP images were rendered by post processing. CONTRAST:  79m GADAVIST GADOBUTROL 1 MMOL/ML IV SOLN COMPARISON:  No priors. FINDINGS: Lower chest: Linear areas of signal intensity within the lower lobes of the lungs bilaterally, poorly evaluated on today's abdominal MRI, but statistically likely to represent areas of subsegmental atelectasis or scarring. Hepatobiliary: Liver has a slightly shrunken appearance and nodular contour, indicative of underlying cirrhosis. Scattered throughout the liver there are some lace-like regions of T2 hyperintensity which correspond to areas of progressive delayed enhancement on post gadolinium imaging, indicative of developing regions of fibrosis, most confluent in the right lobe of the liver. No suspicious cystic or solid hepatic lesions. No intra or extrahepatic biliary ductal dilatation. Status post cholecystectomy. Percutaneous drain with tip terminating in the gallbladder fossa. No definite fluid collection around the tip of the drainage catheter. Pancreas: No pancreatic mass. No pancreatic ductal dilatation. No pancreatic or peripancreatic fluid collections or inflammatory changes. Spleen:  Unremarkable. Adrenals/Urinary Tract: Bilateral kidneys and bilateral adrenal glands are  normal in appearance. No hydroureteronephrosis in the visualized portions of the abdomen. Stomach/Bowel: Visualized portions are unremarkable. Vascular/Lymphatic: No aneurysm identified in the visualized abdominal vasculature. No lymphadenopathy noted in the abdomen. Other: No significant volume of ascites noted in the visualized portions of the peritoneal cavity. Musculoskeletal: No aggressive appearing osseous lesions are noted in the visualized portions of the abdomen. IMPRESSION: 1. Status post cholecystectomy with percutaneous drainage catheter in the gallbladder fossa, without residual fluid collection around the catheter tip. 2. Hepatic cirrhosis with developing hepatic fibrosis, most confluent in the right lobe of the liver. Electronically Signed   By: DVinnie LangtonM.D.   On: 05/25/2020 15:02    Labs:  CBC: Recent Labs    05/25/20 0541 05/27/20 0610 05/28/20 0522 05/29/20 0924  WBC 6.9 6.8 6.1 4.9  HGB 9.3* 10.8* 9.9* 10.3*  HCT 32.2* 36.8 33.7* 35.4*  PLT 224 228 239 227    COAGS: Recent Labs    08/05/19 0530 05/20/20 2100  INR 1.2 1.6*  APTT 30 58*    BMP: Recent Labs    08/09/19 0431 08/11/19 0445 11/07/19 1204 02/16/20 0520 05/20/20 1152 05/25/20 0541 05/27/20 0610 05/28/20 0522 05/29/20 0924  NA 138 142 140 138   < > 138 140 138 141  K 3.6 4.0 4.9 3.9   < > 4.2 3.9 3.7 4.0  CL 107 104 108 107   < > 104 103 103 108  CO2 20* 29 20* 17*   < > 24 25 23 23   GLUCOSE 181* 221* 321* 339*   < > 102* 203* 181* 228*  BUN 15 8 31* 39*   < > 14 12 13 15   CALCIUM 8.1* 8.2* 8.4* 8.1*   < > 8.5* 9.1 9.0 8.8*  CREATININE 0.51 0.40* 0.57 1.08*   < > 0.55 0.66 0.55 0.64  GFRNONAA >60 >60 >60 58*   < > >60 >60 >60 >60  GFRAA >60 >60 >60 >60  --   --   --   --   --    < > = values in this interval not displayed.    LIVER FUNCTION TESTS: Recent Labs    05/25/20 0541 05/27/20 0610 05/28/20 0522 05/29/20 0924  BILITOT 0.6 0.4 0.4 0.2*  AST 47* 42* 36 44*  ALT 34  30 26 30   ALKPHOS 179* 175* 139* 125  PROT 6.5 7.3 6.5 6.8  ALBUMIN 3.1* 3.4* 3.2* 3.3*    Assessment and Plan: History of acute cholecystitis s/p cholecystectomy in 09/4654; complicated by persistent cystic duck leak causing biloma despite plastic (occluded) and metal biliary stent placement s/p gallbladder fossa drain placement in IR (for hopes at healing biliary fistula) 05/23/2020; s/p ERCP 1/3 with removal of preexisting partially displaced metallic stent and insertion of new CBD plastic stent; afebrile; WBC nl; hgb 10.3; creat nl; t bili 0.2; JP bulb switched out for gravity bag due to persistent bilious output; continue to flush drain once daily with 5 cc sterile NS, monitor OP closely- once OP minimal obtain f/u CT prior to drain removal(can be done either inhouse or at IR drain clinic); rib detail film yesterday revealed:  1. Right upper quadrant drainage catheter and biliary stent in stable position. 2. Nondisplaced right eighth and ninth rib fractures cannot be excluded. 3. Large amount of stool noted throughout the colon. Constipation cannot be excluded.   Electronically Signed: D. Rowe Robert, PA-C 05/29/2020, 10:12 AM   I spent a total of 15 minutes at the the patient's bedside AND on the patient's hospital floor or unit, greater than 50% of which was counseling/coordinating care for gallbladder fossa drain    Patient ID: Kim Hicks, female   DOB: January 31, 1966, 55 y.o.   MRN: [id number]

## 2020-05-29 NOTE — Progress Notes (Signed)
Inpatient Diabetes Program Recommendations  AACE/ADA: New Consensus Statement on Inpatient Glycemic Control (2015)  Target Ranges:  Prepandial:   less than 140 mg/dL      Peak postprandial:   less than 180 mg/dL (1-2 hours)      Critically ill patients:  140 - 180 mg/dL   Lab Results  Component Value Date   GLUCAP 153 (H) 05/29/2020   HGBA1C 8.2 (H) 05/21/2020    Review of Glycemic Control Results for Kim Hicks, Kim Hicks (MRN [id number]) as of 05/29/2020 11:37  Ref. Range 05/29/2020 00:15 05/29/2020 04:12 05/29/2020 07:32  Glucose-Capillary Latest Ref Range: 70 - 99 mg/dL 216 (H) 171 (H) 153 (H)   Diabetes history: Type 2 DM Outpatient Diabetes medications: Trulicity 4 mg qwk, Synjardy 25/1000 mg QD, Basaglar 38 units QHS, Metformin 1000 mg QHS Current orders for Inpatient glycemic control: Novolog 0-9 units Q4H  Inpatient Diabetes Program Recommendations:    Consider adding Levemir 16 units QHS and changing diet to carb modified.   Thanks, Bronson Curb, MSN, RNC-OB Diabetes Coordinator [phone] (8a-5p)

## 2020-05-29 NOTE — Progress Notes (Signed)
Nutrition Follow-up  DOCUMENTATION CODES:   Non-severe (moderate) malnutrition in context of chronic illness  INTERVENTION:   -Increase Ensure Enlive po to TID, each supplement provides 350 kcal and 20 grams of protein -Magic cup TID with meals, each supplement provides 290 kcal and 9 grams of protein -MVI with minerals daily  NUTRITION DIAGNOSIS:   Moderate Malnutrition related to chronic illness,poor appetite,constipation (cirrhosis) as evidenced by percent weight loss,mild fat depletion,moderate muscle depletion.  Ongoing  GOAL:   Patient will meet greater than or equal to 90% of their needs  Unmet  MONITOR:   PO intake,Supplement acceptance,Labs,Weight trends,I & O's  REASON FOR ASSESSMENT:   Malnutrition Screening Tool    ASSESSMENT:   55 y.o. female with medical history significant for liver cirrhosis, insulin-dependent type 2 diabetes, hypertension, hyperlipidemia, anemia of chronic disease, depression/anxiety who presents to the ED for evaluation of right flank and right sided abdominal pain. Found to have biloma although suspect that her abdominal pain is coming from her constipation.  12/30- s/p drain placement and drainage of gallbladder fossa 1/3- s/p ERCP- biliary stent removed (migrated to duodenum), biliary tree swept and sludge found, no bile leak, plastic stent placed into common bile duct  Reviewed I/O's: +1.1 L x 24 hours and +2.6 L since admission  Drain output: 200 ml x 24 hours  Pt unavailable at time of attempted contact.   Per MD notes, drainage to gallbladder drain is increasing.   Pt remains with poor oral intake. Noted meal completions 0-20%. Pt is consuming Ensure Enlive supplements per MAR.   Medications reviewed and include miralax, florastor, senokot, and 0.9% sodium chloride infusion @ 50 ml/hr.   Pt with poor oral intake and would benefit from nutrient dense supplement. One Ensure Enlive supplement provides 350 kcals, 20 grams  protein, and 44-45 grams of carbohydrate vs one Glucerna shake supplement, which provides 220 kcals, 10 grams of protein, and 26 grams of carbohydrate. Given pt's hx of DM, RD will reassess adequacy of PO intake, CBGS, and adjust supplement regimen as appropriate at follow-up.   Labs reviewed: CBGS: [phone] (inpatient orders for glycemic control are 0-9 units insulin aspart every 4 hours).   Diet Order:   Diet Order            Diet regular Room service appropriate? Yes; Fluid consistency: Thin  Diet effective now                 EDUCATION NEEDS:   Education needs have been addressed  Skin:  Skin Assessment: Reviewed RN Assessment  Last BM:  05/27/20  Height:   Ht Readings from Last 1 Encounters:  05/27/20 5' 4"  (1.626 m)    Weight:   Wt Readings from Last 1 Encounters:  05/27/20 72.4 kg   BMI:  Body mass index is 27.4 kg/m.  Estimated Nutritional Needs:   Kcal:  1800-2000  Protein:  85-100g  Fluid:  2L/day    Loistine Chance, RD, LDN, [place] Registered Dietitian II Certified Diabetes Care and Education Specialist Please refer to [facility] for RD and/or RD on-call/weekend/after hours pager

## 2020-05-30 LAB — COMPREHENSIVE METABOLIC PANEL
ALT: 25 U/L (ref 0–44)
AST: 36 U/L (ref 15–41)
Albumin: 3.1 g/dL — ABNORMAL LOW (ref 3.5–5.0)
Alkaline Phosphatase: 139 U/L — ABNORMAL HIGH (ref 38–126)
Anion gap: 9 (ref 5–15)
BUN: 21 mg/dL — ABNORMAL HIGH (ref 6–20)
CO2: 21 mmol/L — ABNORMAL LOW (ref 22–32)
Calcium: 8.7 mg/dL — ABNORMAL LOW (ref 8.9–10.3)
Chloride: 109 mmol/L (ref 98–111)
Creatinine, Ser: 0.65 mg/dL (ref 0.44–1.00)
GFR, Estimated: >60 mL/min (ref >60)
Glucose, Bld: 256 mg/dL — ABNORMAL HIGH (ref 70–99)
Potassium: 3.9 mmol/L (ref 3.5–5.1)
Sodium: 139 mmol/L (ref 135–145)
Total Bilirubin: 0.5 mg/dL (ref 0.3–1.2)
Total Protein: 6.2 g/dL — ABNORMAL LOW (ref 6.5–8.1)

## 2020-05-30 LAB — CBC WITH DIFFERENTIAL/PLATELET
Abs Immature Granulocytes: 0.03 10*3/uL (ref 0.00–0.07)
Basophils Absolute: 0 10*3/uL (ref 0.0–0.1)
Basophils Relative: 0 %
Eosinophils Absolute: 0.2 10*3/uL (ref 0.0–0.5)
Eosinophils Relative: 3 %
HCT: 32.7 % — ABNORMAL LOW (ref 36.0–46.0)
Hemoglobin: 9.6 g/dL — ABNORMAL LOW (ref 12.0–15.0)
Immature Granulocytes: 0 %
Lymphocytes Relative: 15 %
Lymphs Abs: 1.2 10*3/uL (ref 0.7–4.0)
MCH: 25.5 pg — ABNORMAL LOW (ref 26.0–34.0)
MCHC: 29.4 g/dL — ABNORMAL LOW (ref 30.0–36.0)
MCV: 86.7 fL (ref 80.0–100.0)
Monocytes Absolute: 0.5 10*3/uL (ref 0.1–1.0)
Monocytes Relative: 6 %
Neutro Abs: 6 10*3/uL (ref 1.7–7.7)
Neutrophils Relative %: 76 %
Platelets: 218 10*3/uL (ref 150–400)
RBC: 3.77 MIL/uL — ABNORMAL LOW (ref 3.87–5.11)
RDW: 15.9 % — ABNORMAL HIGH (ref 11.5–15.5)
WBC: 7.9 10*3/uL (ref 4.0–10.5)
nRBC: 0 % (ref 0.0–0.2)

## 2020-05-30 LAB — GLUCOSE, CAPILLARY
Glucose-Capillary: 249 mg/dL — ABNORMAL HIGH (ref 70–99)
Glucose-Capillary: 281 mg/dL — ABNORMAL HIGH (ref 70–99)
Glucose-Capillary: 288 mg/dL — ABNORMAL HIGH (ref 70–99)
Glucose-Capillary: 392 mg/dL — ABNORMAL HIGH (ref 70–99)

## 2020-05-30 MED ORDER — SENNOSIDES-DOCUSATE SODIUM 8.6-50 MG PO TABS: 1.0000 | ORAL_TABLET | Freq: Every day | ORAL | Status: AC

## 2020-05-30 MED ORDER — OXYCODONE HCL 10 MG PO TABS: 10.0000 mg | ORAL_TABLET | Freq: Four times a day (QID) | ORAL | 0 refills | Status: AC | PRN

## 2020-05-30 MED ORDER — AMOXICILLIN-POT CLAVULANATE 875-125 MG PO TABS: 1.0000 | ORAL_TABLET | Freq: Two times a day (BID) | ORAL | 0 refills | Status: AC

## 2020-05-30 MED ORDER — POLYETHYLENE GLYCOL 3350 17 G PO PACK: 17.0000 g | PACK | Freq: Every day | ORAL | Status: DC

## 2020-05-30 MED ORDER — ENSURE ENLIVE PO LIQD: 237.0000 mL | Freq: Three times a day (TID) | ORAL | Status: DC

## 2020-05-30 NOTE — TOC Initial Note (Signed)
Transition of Care [facility]) - Initial/Assessment Note    Patient Details  Name: Kim Hicks MRN: [id number] Date of Birth: 1965-07-21  [facility] CM/SW Contact:    Lynnell Catalan, RN Phone Number: 05/30/2020, 2:19 PM  Clinical Narrative:                 Spoke with pt for dc planning. Pt to dc home with a new drain placed by IR. Choice offered for [facility] services and [facility] chosen. Referral called to [facility]. HHRN orders received from MD.  Expected Discharge Plan: [place] Barriers to Discharge: No Barriers Identified   Patient Goals and CMS Choice Patient states their goals for this hospitalization and ongoing recovery are:: to go home CMS Medicare.gov Compare Post Acute Care list provided to:: Patient Choice offered to / list presented to : Patient  Expected Discharge Plan and Services Expected Discharge Plan: [place]   Discharge Planning Services: CM Consult Post Acute Care Choice: [place] arrangements for the past 2 months: Single Family Home Expected Discharge Date: 05/30/20                         HH Arranged: RN [place] Agency: [place] Date [facility] Agency Contacted: 05/30/20 Time HH Agency Contacted: 1100 Representative spoke with at [place]: Tommi Rumps  Prior Living Arrangements/Services Living arrangements for the past 2 months: [place] Lives with:: Spouse Patient language and need for interpreter reviewed:: Yes Do you feel safe going back to the place where you live?: Yes      Need for Family Participation in Patient Care: Yes (Comment) Care giver support system in place?: Yes (comment)   Criminal Activity/Legal Involvement Pertinent to Current Situation/Hospitalization: No - Comment as needed  Activities of Daily Living Home Assistive Devices/Equipment: Gilford Rile (specify type) ADL Screening (condition at time of admission) Patient's cognitive ability adequate to safely complete  daily activities?: Yes Is the patient deaf or have difficulty hearing?: No Does the patient have difficulty seeing, even when wearing glasses/contacts?: Yes Does the patient have difficulty concentrating, remembering, or making decisions?: Yes (trouble concentrating) Patient able to express need for assistance with ADLs?: Yes Does the patient have difficulty dressing or bathing?: No Independently performs ADLs?: Yes (appropriate for developmental age) Does the patient have difficulty walking or climbing stairs?: Yes Weakness of Legs: Both Weakness of Arms/Hands: Both  Permission Sought/Granted Permission sought to share information with : Chartered certified accountant granted to share information with : Yes, Verbal Permission Granted     Permission granted to share info w AGENCY: [place]        Emotional Assessment Appearance:: Appears stated age Attitude/Demeanor/Rapport: Self-Confident Affect (typically observed): Calm Orientation: : Oriented to Self,Oriented to Place,Oriented to  Time,Oriented to Situation Alcohol / Substance Use: Not Applicable    Admission diagnosis:  Acute UTI [N39.0] Biloma [K66.8] Abdominal pain [R10.9] Patient Active Problem List   Diagnosis Date Noted  . Abscess of gallbladder   . Migration of biliary stent   . Malnutrition of moderate degree 05/23/2020  . Abdominal pain 05/21/2020  . Biloma 05/20/2020  . Biliary sludge   . Encounter for removal of biliary stent   . Diabetic peripheral neuropathy ([place]) 09/14/2019  . Gait disturbance 09/14/2019  . NASH (nonalcoholic steatohepatitis) 08/21/2019  . Bile leak   . Other cirrhosis of liver ([place]) 08/04/2019  . DYSPHAGIA UNSPECIFIED 03/04/2009  .  GERD 02/18/2009  . PERSISTENT VOMITING 02/18/2009  . CHEST PAIN 02/18/2009  . NAUSEA AND VOMITING 02/18/2009  . DEHYDRATION 07/19/2007  . GASTROENTERITIS 07/19/2007  . FIBROIDS, UTERUS 05/02/2007  . DEPRESSION 05/02/2007  . MIGRAINE HEADACHE  05/02/2007  . Hypertension associated with diabetes ([place]) 05/02/2007  . HEMORRHOIDS 05/02/2007  . Chronic constipation 05/02/2007  . IBS 05/02/2007  . PSORIASIS 05/02/2007  . PITUITARY NEOPLASM, HX OF 05/02/2007  . LIVER FUNCTION TESTS, ABNORMAL, HX OF 05/02/2007  . Insulin dependent type 2 diabetes mellitus ([place]) 02/04/2007  . Hyperlipidemia associated with type 2 diabetes mellitus ([place]) 02/04/2007  . SINUSITIS, ACUTE 02/04/2007  . ABDOMINAL PAIN, RIGHT UPPER QUADRANT 02/04/2007   PCP:  [name], FNP Pharmacy:   [id number] [id number]: [phone]: [phone]    Social Determinants of Health (SDOH) Interventions    Readmission Risk Interventions No flowsheet data found.

## 2020-05-30 NOTE — Telephone Encounter (Signed)
Needing ct scan abd/pelv 1 week prior to f/u id clinic

## 2020-05-30 NOTE — Telephone Encounter (Signed)
Patient scheduled for 06-07-20

## 2020-05-30 NOTE — Discharge Summary (Signed)
Physician Discharge Summary  [name]:[id number] DOB: [birth year] DOA: 05/20/2020  PCP: [name], FNP  Admit date: 05/20/2020 Discharge date: 05/30/2020  Admitted From: Home Disposition: Home  Recommendations for Outpatient Follow-up:  1. Follow up with PCP in 1 week 2. Follow up with IR and GI 3. Please follow up on the following pending results: None  Home Health: RN Equipment/Devices: Percutaneous drain  Discharge Condition: Stable CODE STATUS: Full code Diet recommendation: Regular diet   Brief/Interim Summary:  Admission HPI written by [name], MD   Chief Complaint: Right-sided abdominal and flank pain  HPI: Kim Hicks is a 55 y.o. female with medical history significant for liver cirrhosis, insulin-dependent type 2 diabetes, hypertension, hyperlipidemia, anemia of chronic disease, depression/anxiety who presents to the ED for evaluation of right flank and right sided abdominal pain.  Patient has known persistent bile leak after undergoing laparoscopic cholecystectomy on 08/04/2019 for chronic cholecystitis.  She had a postop bile leak and underwent ERCP 08/07/2019 by Dr. Fuller Plan at which time a biliary sphincterotomy was performed and stent was placed into the CBD.  Follow-up ERCP was performed 11/07/2019 and persistent bile leak was again found in biliary stent was exchanged.  She underwent repeat ERCP 05/13/2020 when again a persistent bile leak was found at the cystic duct.  A clogged stent was removed from the biliary tree and a metal stent was placed in the CBD.  The following day patient developed progressively worsening RLQ and right flank pain.  Pain has been constant and radiating from her right groin up to her RUQ and right flank.  She has had associated diaphoresis without subjective fevers or chills.  She reports nausea without emesis.  She says she has occasional midsternal sharp nonradiating chest discomfort when she feels anxious.  She  reports chronic constipation with last bowel movement 5 days ago which is usual for her.  She reports increased urinary frequency without dysuria.  She has not seen any swelling in her legs or abdomen.  She was seen by GI in clinic earlier today 05/20/2020.  Previsit labs showed alk phos 244, AST 44, ALT 41, total bilirubin 0.3, lipase 101, WBC 6.2, hemoglobin 9.6.   There was concern for developing post ERCP pancreatitis versus biliary stent migration versus bile peritonitis and she was sent to [facility] long ED for stat CT abdomen/pelvis.    Hospital course:  [place] Patient with complicated history of recurrent leak. Multiple stents placed. Most recently, GI placed plastic stent on 1/3 via ERCP.  Enterococcal infection Biliary source. Started on Zosyn and transitioned to Unasyn. ID recommending Augmentin to complete a 14 day course.  Constipation Resolved with SMOG enema, Miralax and Senokot-S. Continue Miralax and Senokot-S  Diabetes mellitus, type 2 Continue home basaglar, metformin, Synjardy  Cirrhosis Recent MRCP significant for cirrhosis with developing hepatic fibrosis.  Primary hypertension On lisinopril as an outpatient.  Anemia of chronic disease Stable.  Depression Anxiety Continue Topamax, vortioxetine, Seroquel, Rexulti, [name]  Discharge Diagnoses:  Principal Problem:   Biloma Active Problems:   Insulin dependent type 2 diabetes mellitus ([place])   Hyperlipidemia associated with type 2 diabetes mellitus ([place])   Hypertension associated with diabetes ([place])   Other cirrhosis of liver ([facility])   Bile leak   Abdominal pain   Malnutrition of moderate degree   Migration of biliary stent   Abscess of gallbladder    Discharge Instructions   Allergies as of 05/30/2020      Reactions  Talwin [pentazocine]    Turned blue around lips and face, rash all over body      Medication List    STOP taking these medications   traMADol 50 MG tablet Commonly  known as: [name]     TAKE these medications   albuterol 108 (90 Base) MCG/ACT inhaler Commonly known as: VENTOLIN HFA Inhale 2 puffs into the lungs every 6 (six) hours as needed for shortness of breath or wheezing.   amoxicillin-clavulanate 875-125 MG tablet Commonly known as: AUGMENTIN Take 1 tablet by mouth every 12 (twelve) hours for 12 days.   Basaglar KwikPen 100 UNIT/ML Inject 38 Units into the skin at bedtime.   brexpiprazole 2 MG Tabs tablet Commonly known as: REXULTI Take 2 mg by mouth at bedtime.   calcium carbonate 1500 (600 Ca) MG Tabs tablet Commonly known as: OSCAL Take 600 mg of elemental calcium by mouth daily with breakfast.   Dulaglutide 4.5 MG/0.5ML Sopn Inject 4 mg into the skin once a week. Monday   ezetimibe 10 MG tablet Commonly known as: ZETIA Take 10 mg by mouth daily.   feeding supplement Liqd Take 237 mLs by mouth 3 (three) times daily between meals.   ferrous sulfate 325 (65 FE) MG tablet Take 325 mg by mouth daily with breakfast.   Linzess 290 MCG Caps capsule Generic drug: linaclotide Take 290 mcg by mouth every morning.   lisinopril 20 MG tablet Commonly known as: ZESTRIL TAKE 1 TABLET BY MOUTH EVERY DAY   metFORMIN 1000 MG tablet Commonly known as: GLUCOPHAGE Take 1,000 mg by mouth at bedtime.   OMEGA 3 PO Take 500 mg by mouth daily.   Oxycodone HCl 10 MG Tabs Take 1 tablet (10 mg total) by mouth every 6 (six) hours as needed for up to 5 days for severe pain or moderate pain.   polyethylene glycol 17 g packet Commonly known as: MIRALAX / GLYCOLAX Take 17 g by mouth daily.   prazosin 2 MG capsule Commonly known as: MINIPRESS Take 4 mg by mouth at bedtime.   pregabalin 100 MG capsule Commonly known as: LYRICA TAKE 1 CAPSULE BY MOUTH THREE TIMES A DAY What changed: See the new instructions.   QUEtiapine 400 MG tablet Commonly known as: SEROQUEL Take 800 mg by mouth at bedtime.   senna-docusate 8.6-50 MG  tablet Commonly known as: Senokot-S Take 1 tablet by mouth at bedtime for 5 days.   simvastatin 10 MG tablet Commonly known as: ZOCOR Take 10 mg by mouth daily before breakfast.   Synjardy XR 25-1000 MG Tb24 Generic drug: Empagliflozin-metFORMIN HCl ER Take 1 tablet by mouth daily before breakfast.   topiramate 200 MG tablet Commonly known as: TOPAMAX Take 200 mg by mouth 2 (two) times daily.   vortioxetine HBr 20 MG Tabs tablet Commonly known as: [name] Take 20 mg by mouth at bedtime.   ziprasidone 20 MG capsule Commonly known as: [name] Take 20 mg by mouth at bedtime.       Allergies  Allergen Reactions  . Talwin [Pentazocine]     Turned blue around lips and face, rash all over body    Consultations:  Infectious disease, Gastroenterology, Interventional radiology   Procedures/Studies: DG Ribs Unilateral Right  Result Date: 05/28/2020 CLINICAL DATA:  History of fracture. Complaining of pain around sided gallbladder fossa drain. EXAM: RIGHT RIBS - 2 VIEW COMPARISON:  ERCP 05/27/2020.  CT 05/23/2020. FINDINGS: Right upper chest and ribs incompletely imaged. Nondisplaced right eighth and ninth rib fractures cannot  be excluded. No visualized pneumothorax surgical clips right upper quadrant. Right upper quadrant drainage catheter and biliary stent stable position. Bibasilar atelectasis and or scarring. Large amount of stool in a colon suggesting constipation. IMPRESSION: 1. Right upper quadrant drainage catheter and biliary stent in stable position. 2. Nondisplaced right eighth and ninth rib fractures cannot be excluded. 3. Large amount of stool noted throughout the colon. Constipation cannot be excluded. Electronically Signed   By: Marcello Moores  Register   On: 05/28/2020 11:41   CT ABDOMEN PELVIS W CONTRAST  Result Date: 05/20/2020 CLINICAL DATA:  Recent ERCP with stent placement, prior cholecystectomy EXAM: CT ABDOMEN AND PELVIS WITH CONTRAST TECHNIQUE: Multidetector CT  imaging of the abdomen and pelvis was performed using the standard protocol following bolus administration of intravenous contrast. CONTRAST:  138m OMNIPAQUE IOHEXOL 300 MG/ML  SOLN COMPARISON:  March 12, 2020 FINDINGS: Lower chest: The visualized heart size within normal limits. No pericardial fluid/thickening. No hiatal hernia. The visualized portions of the lungs are clear. Hepatobiliary: There is a slightly nodular liver contour which is diffusely low signal throughout. The portal vein appears to be patent. The patient is status post common bile duct stent placement. Within the area of the gallbladder bed there is a new multilocular fluid collection measuring approximately 4.6 x 2.6 cm. The adjacent cholecystectomy surgical clips are noted. A small amount of pneumobilia is noted. There appears to be mild fat stranding changes seen adjacent to the gallbladder bed at the level of the proximal bile duct stent. No free air is noted. Pancreas: Unremarkable. No pancreatic ductal dilatation or surrounding inflammatory changes. Spleen: The spleen is mildly enlarged measuring 15 cm in craniocaudad dimension. Adrenals/Urinary Tract: Both adrenal glands appear normal. The kidneys and collecting system appear normal without evidence of urinary tract calculus or hydronephrosis. Bladder is partially decompressed. Stomach/Bowel: The stomach, small bowel, and colon are normal in appearance. No inflammatory changes, wall thickening, or obstructive findings.A moderate to large amount of colonic stool is present throughout. The appendix is unremarkable. Vascular/Lymphatic: There are no enlarged mesenteric, retroperitoneal, or pelvic lymph nodes. No significant vascular findings are present. Reproductive: The patient is status post hysterectomy. No adnexal masses or collections seen. Other: No evidence of abdominal wall mass or hernia. Musculoskeletal: No acute or significant osseous findings. IMPRESSION: Multilocular fluid  collection within the gallbladder bed measuring 4.6 x 2.6 cm which could be due to biloma, or postprocedural seroma. Mild inflammatory changes seen within the porta hepatis No definite evidence of pancreatitis Findings suggestive of cirrhosis and portal hypertension Electronically Signed   By: BPrudencio PairM.D.   On: 05/20/2020 19:13   MR 3D Recon At Scanner  Result Date: 05/25/2020 CLINICAL DATA:  55year old female with history of right upper quadrant abdominal pain. Prior history of cholecystectomy. Evaluate for possible biliary fistula. EXAM: MRI ABDOMEN WITHOUT AND WITH CONTRAST (INCLUDING MRCP) TECHNIQUE: Multiplanar multisequence MR imaging of the abdomen was performed both before and after the administration of intravenous contrast. Heavily T2-weighted images of the biliary and pancreatic ducts were obtained, and three-dimensional MRCP images were rendered by post processing. CONTRAST:  75mGADAVIST GADOBUTROL 1 MMOL/ML IV SOLN COMPARISON:  No priors. FINDINGS: Lower chest: Linear areas of signal intensity within the lower lobes of the lungs bilaterally, poorly evaluated on today's abdominal MRI, but statistically likely to represent areas of subsegmental atelectasis or scarring. Hepatobiliary: Liver has a slightly shrunken appearance and nodular contour, indicative of underlying cirrhosis. Scattered throughout the liver there are some lace-like regions of  T2 hyperintensity which correspond to areas of progressive delayed enhancement on post gadolinium imaging, indicative of developing regions of fibrosis, most confluent in the right lobe of the liver. No suspicious cystic or solid hepatic lesions. No intra or extrahepatic biliary ductal dilatation. Status post cholecystectomy. Percutaneous drain with tip terminating in the gallbladder fossa. No definite fluid collection around the tip of the drainage catheter. Pancreas: No pancreatic mass. No pancreatic ductal dilatation. No pancreatic or peripancreatic  fluid collections or inflammatory changes. Spleen:  Unremarkable. Adrenals/Urinary Tract: Bilateral kidneys and bilateral adrenal glands are normal in appearance. No hydroureteronephrosis in the visualized portions of the abdomen. Stomach/Bowel: Visualized portions are unremarkable. Vascular/Lymphatic: No aneurysm identified in the visualized abdominal vasculature. No lymphadenopathy noted in the abdomen. Other: No significant volume of ascites noted in the visualized portions of the peritoneal cavity. Musculoskeletal: No aggressive appearing osseous lesions are noted in the visualized portions of the abdomen. IMPRESSION: 1. Status post cholecystectomy with percutaneous drainage catheter in the gallbladder fossa, without residual fluid collection around the catheter tip. 2. Hepatic cirrhosis with developing hepatic fibrosis, most confluent in the right lobe of the liver. Electronically Signed   By: Vinnie Langton M.D.   On: 05/25/2020 15:02   US Abdomen Limited  Result Date: 05/23/2020 CLINICAL DATA:  Gallbladder fossa fluid collection after prior cholecystectomy. Assessment for ultrasound-guided aspiration. EXAM: ULTRASOUND ABDOMEN LIMITED RIGHT UPPER QUADRANT COMPARISON:  CT of the abdomen on 05/20/2020 FINDINGS: The focal fluid noted in the gallbladder fossa by recent CT is not definitively visualized by ultrasound. Shadowing is identified from clips in the gallbladder fossa. No biliary ductal dilatation identified. No perihepatic free fluid seen. IMPRESSION: Gallbladder fossa fluid is not visualized by ultrasound. The patient was transported to CT for imaging for possible CT-guided aspiration. Electronically Signed   By: Aletta Edouard M.D.   On: 05/23/2020 15:04   DG ERCP  Result Date: 05/27/2020 CLINICAL DATA:  Biliary stent manipulation EXAM: ERCP TECHNIQUE: Multiple spot images obtained with the fluoroscopic device and submitted for interpretation post-procedure. FLUOROSCOPY TIME:  Fluoroscopy  Time:  2 minutes 51 seconds COMPARISON:  MRCP 05/25/2020 FINDINGS: Twenty-one images are submitted for review. The images demonstrate a flexible duodenal scope in the descending duodenum. A percutaneous drainage catheter is present in the right upper quadrant. A metallic biliary stents is also present in the common bile duct. Subsequent images demonstrate removal of the biliary stent and cannulation of the intrahepatic ducts with cholangiography. Subsequently, balloon sweeping of the common duct is performed followed by placement of a plastic biliary stent. IMPRESSION: 1. Removal of existing metallic biliary stent, balloon sweeping of the bile duct and placement of a plastic biliary stent. These images were submitted for radiologic interpretation only. Please see the procedural report for the amount of contrast and the fluoroscopy time utilized. Electronically Signed   By: Jacqulynn Cadet M.D.   On: 05/27/2020 13:36   DG ERCP  Result Date: 05/13/2020 CLINICAL DATA:  Bile leak post cholecystectomy, status post endoscopic stenting EXAM: ERCP TECHNIQUE: Multiple spot images obtained with the fluoroscopic device and submitted for interpretation post-procedure. COMPARISON:  11/07/2019 FINDINGS: Series of fluoroscopic spot images document endoscopic cannulation and opacification of the CBD with removal of plastic endoscopic stent, passage of balloon catheter. Leak from the cystic duct stump is again identified. Metallic biliary stent was then deployed through the CBD into the duodenum. IMPRESSION: Persistent cystic duct stump leak. Metallic biliary stent placement. These images were submitted for radiologic interpretation only. Please see the  procedural report for the amount of contrast and the fluoroscopy time utilized. Electronically Signed   By: Lucrezia Europe M.D.   On: 05/13/2020 09:37   MR ABDOMEN MRCP W WO CONTAST  Result Date: 05/25/2020 CLINICAL DATA:  55 year old female with history of right upper quadrant  abdominal pain. Prior history of cholecystectomy. Evaluate for possible biliary fistula. EXAM: MRI ABDOMEN WITHOUT AND WITH CONTRAST (INCLUDING MRCP) TECHNIQUE: Multiplanar multisequence MR imaging of the abdomen was performed both before and after the administration of intravenous contrast. Heavily T2-weighted images of the biliary and pancreatic ducts were obtained, and three-dimensional MRCP images were rendered by post processing. CONTRAST:  14m GADAVIST GADOBUTROL 1 MMOL/ML IV SOLN COMPARISON:  No priors. FINDINGS: Lower chest: Linear areas of signal intensity within the lower lobes of the lungs bilaterally, poorly evaluated on today's abdominal MRI, but statistically likely to represent areas of subsegmental atelectasis or scarring. Hepatobiliary: Liver has a slightly shrunken appearance and nodular contour, indicative of underlying cirrhosis. Scattered throughout the liver there are some lace-like regions of T2 hyperintensity which correspond to areas of progressive delayed enhancement on post gadolinium imaging, indicative of developing regions of fibrosis, most confluent in the right lobe of the liver. No suspicious cystic or solid hepatic lesions. No intra or extrahepatic biliary ductal dilatation. Status post cholecystectomy. Percutaneous drain with tip terminating in the gallbladder fossa. No definite fluid collection around the tip of the drainage catheter. Pancreas: No pancreatic mass. No pancreatic ductal dilatation. No pancreatic or peripancreatic fluid collections or inflammatory changes. Spleen:  Unremarkable. Adrenals/Urinary Tract: Bilateral kidneys and bilateral adrenal glands are normal in appearance. No hydroureteronephrosis in the visualized portions of the abdomen. Stomach/Bowel: Visualized portions are unremarkable. Vascular/Lymphatic: No aneurysm identified in the visualized abdominal vasculature. No lymphadenopathy noted in the abdomen. Other: No significant volume of ascites noted in the  visualized portions of the peritoneal cavity. Musculoskeletal: No aggressive appearing osseous lesions are noted in the visualized portions of the abdomen. IMPRESSION: 1. Status post cholecystectomy with percutaneous drainage catheter in the gallbladder fossa, without residual fluid collection around the catheter tip. 2. Hepatic cirrhosis with developing hepatic fibrosis, most confluent in the right lobe of the liver. Electronically Signed   By: DVinnie LangtonM.D.   On: 05/25/2020 15:02   CT IMAGE GUIDED FLUID DRAIN BY CATHETER  Result Date: 05/23/2020 CLINICAL DATA:  Status post cholecystectomy in March which was complicated by a bile leak treated with placement of an endoscopic biliary stent. The stent has been exchanged and eventually replaced for a covered self expanding metallic stent on 144/81/8563 The patient has had persistent pain and CT on 05/20/2020 demonstrates a fluid collection in the gallbladder fossa. Nuclear medicine study on 05/21/2020 did not demonstrate an active bile leak. Due to persistent severe pain, the patient now presents for aspiration and possible catheter drainage of the gallbladder fossa fluid collection. EXAM: CT GUIDED PERCUTANEOUS CATHETER DRAINAGE OF GALLBLADDER FOSSA ABSCESS ANESTHESIA/SEDATION: 3.0 mg IV Versed 100 mcg IV Fentanyl Total Moderate Sedation Time:  12 minutes The patient's level of consciousness and physiologic status were continuously monitored during the procedure by Radiology nursing. PROCEDURE: The procedure, risks, benefits, and alternatives were explained to the patient. Questions regarding the procedure were encouraged and answered. The patient understands and consents to the procedure. A time out was performed prior to initiating the procedure. CT was performed of the abdomen in a supine position. The right abdominal wall was prepped with chlorhexidine in a sterile fashion, and a sterile drape was  applied covering the operative field. A sterile gown  and sterile gloves were used for the procedure. Local anesthesia was provided with 1% Lidocaine. Under CT guidance, an 18 gauge trocar needle was advanced into the gallbladder fossa. A guidewire was advanced and the needle removed. The tract was dilated and a 10 French percutaneous drainage catheter advanced over the wire. Drain position was confirmed by CT. Fluid was aspirated from the drain and sent for culture analysis. Final drain positioning was confirmed by CT. The drainage catheter was connected to a suction bulb and secured at the skin with a Prolene retention suture and StatLock device. COMPLICATIONS: None FINDINGS: Preliminary CT now shows a change in appearance at the level of the gallbladder fossa with predominantly air present in the gallbladder fossa and a small dependent air-fluid level. This air appears to communicate with air in the intrahepatic biliary tree and air in the common bile duct as well as what appears to be air in the cystic duct stump. 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 Of note, and also apparent on the recent CT of 05/20/2020, is clear distal migration of the recently placed metallic biliary stent which currently barely extends into the distal CBD and mostly extends into the duodenal lumen. The metallic biliary stent does not cover the cystic duct stump insertion. After placement of the drainage catheter in the gallbladder fossa, aspiration yielded approximately 5 mL of bloody fluid which was sent for culture analysis. The air component in the gallbladder fossa was decompressed after drain placement. IMPRESSION: 1. CT-guided placement of 10 French percutaneous drainage catheter within the gallbladder fossa. Approximately 5 mL of bloody fluid was aspirated and sent for culture analysis. 2. Initial CT demonstrated a change in appearance since the prior CT with now predominantly air present in the gallbladder fossa  with a small air-fluid level. This air communicates with air in the cystic duct stump and biliary tree consistent with a fistula to the biliary tree. The previously placed endoscopic covered metallic biliary stent has also clearly migrated distally and is mostly in the duodenum with the proximal portion just extending into the CBD. The metallic stent does not cover the cystic duct stump insertion. Electronically Signed   By: Aletta Edouard M.D.   On: 05/23/2020 17:19   NM HEPATO BILIARY LEAK  Result Date: 05/21/2020 CLINICAL DATA:  Status post cholecystectomy with posterior gallbladder wall left in situ, persistent bile leak status post clogged stent removal with metallic stent placement on 05/13/2020 EXAM: NUCLEAR MEDICINE HEPATOBILIARY IMAGING TECHNIQUE: Sequential images of the abdomen were obtained out to 60 minutes following intravenous administration of radiopharmaceutical. RADIOPHARMACEUTICALS:  5.1 mCi Tc-85m Choletec IV COMPARISON:  None. FINDINGS: Prompt uptake and biliary excretion of activity by the liver is seen. Biliary activity passes into small bowel, consistent with patent common bile duct. No evidence of mild leak. IMPRESSION: No evidence of bile leak. Electronically Signed   By: SJulian HyM.D.   On: 05/21/2020 11:56      Subjective: Some abdominal pain, otherwise, no concerns. Had a good amount of bowel movements yesterday.  Discharge Exam: Vitals:   05/29/20 1958 05/30/20 0613  BP: 111/65 101/61  Pulse: 83 100  Resp: 16 16  Temp: 99.1 F (37.3 C) 97.6 F (36.4 C)  SpO2: 96% 94%   Vitals:   05/28/20 2014 05/29/20 1345 05/29/20 1958 05/30/20 0613  BP: 105/66 133/72 111/65 101/61  Pulse:  81 79 83 100  Resp: 14 19 16 16   Temp: 98.6 F (37 C) 98.5 F (36.9 C) 99.1 F (37.3 C) 97.6 F (36.4 C)  TempSrc: Oral Oral Oral Oral  SpO2: 95% 91% 96% 94%  Weight:      Height:        General: Pt is alert, awake, not in acute distress Cardiovascular: RRR, S1/S2  +, no rubs, no gallops Respiratory: CTA bilaterally, no wheezing, no rhonchi Abdominal: Soft, mildly tender in RUQ, distended/fullness, bowel sounds + Extremities:  no cyanosis    The results of significant diagnostics from this hospitalization (including imaging, microbiology, ancillary and laboratory) are listed below for reference.     Microbiology: Recent Results (from the past 240 hour(s))  Urine culture     Status: Abnormal   Collection Time: 05/20/20  4:56 PM   Specimen: Urine, Random  Result Value Ref Range Status   Specimen Description   Final    URINE, RANDOM Performed at [facility] [address], [place], [place] [address]    Special Requests   Final    NONE Performed at [facility], [place] [address] [place], [place] [address]    Culture >=100,000 COLONIES/mL KLEBSIELLA PNEUMONIAE (A)  Final   Report Status 05/23/2020 FINAL  Final   Organism ID, Bacteria KLEBSIELLA PNEUMONIAE (A)  Final      Susceptibility   Klebsiella pneumoniae - MIC*    AMPICILLIN RESISTANT Resistant     CEFAZOLIN <=4 SENSITIVE Sensitive     CEFEPIME <=0.12 SENSITIVE Sensitive     CEFTRIAXONE <=0.25 SENSITIVE Sensitive     CIPROFLOXACIN <=0.25 SENSITIVE Sensitive     GENTAMICIN <=1 SENSITIVE Sensitive     IMIPENEM 0.5 SENSITIVE Sensitive     NITROFURANTOIN 32 SENSITIVE Sensitive     TRIMETH/SULFA <=20 SENSITIVE Sensitive     AMPICILLIN/SULBACTAM <=2 SENSITIVE Sensitive     PIP/TAZO <=4 SENSITIVE Sensitive     * >=100,000 COLONIES/mL KLEBSIELLA PNEUMONIAE  Resp Panel by RT-PCR (Flu A&B, Covid) Nasopharyngeal Swab     Status: None   Collection Time: 05/20/20  9:23 PM   Specimen: Nasopharyngeal Swab; Nasopharyngeal(NP) swabs in vial transport medium  Result Value Ref Range Status   SARS Coronavirus 2 by RT PCR NEGATIVE NEGATIVE Final    Comment: (NOTE) SARS-CoV-2 target nucleic acids are NOT DETECTED.  The SARS-CoV-2 RNA is generally detectable in  upper respiratory specimens during the acute phase of infection. The lowest concentration of SARS-CoV-2 viral copies this assay can detect is 138 copies/mL. A negative result does not preclude SARS-Cov-2 infection and should not be used as the sole basis for treatment or other patient management decisions. A negative result may occur with  improper specimen collection/handling, submission of specimen other than nasopharyngeal swab, presence of viral mutation(s) within the areas targeted by this assay, and inadequate number of viral copies(<138 copies/mL). A negative result must be combined with clinical observations, patient history, and epidemiological information. The expected result is Negative.  Fact Sheet for Patients:  EntrepreneurPulse.com.au  Fact Sheet for Healthcare Providers:  IncredibleEmployment.be  This test is no t yet approved or cleared by the [place] FDA and  has been authorized for detection and/or diagnosis of SARS-CoV-2 by FDA under an Emergency Use Authorization (EUA). This EUA will remain  in effect (meaning this test can be used) for the duration of the COVID-19 declaration under Section 564(b)(1) of the Act, 21 U.S.C.section 360bbb-3(b)(1), unless the authorization is terminated  or revoked sooner.       Influenza A by PCR NEGATIVE NEGATIVE Final   Influenza B by PCR NEGATIVE NEGATIVE Final    Comment: (NOTE) The Xpert Xpress SARS-CoV-2/FLU/RSV plus assay is intended as an aid in the diagnosis of influenza from Nasopharyngeal swab specimens and should not be used as a sole basis for treatment. Nasal washings and aspirates are unacceptable for Xpert Xpress SARS-CoV-2/FLU/RSV testing.  Fact Sheet for Patients: EntrepreneurPulse.com.au  Fact Sheet for Healthcare Providers: IncredibleEmployment.be  This test is not yet approved or cleared by the [place] FDA and has been  authorized for detection and/or diagnosis of SARS-CoV-2 by FDA under an Emergency Use Authorization (EUA). This EUA will remain in effect (meaning this test can be used) for the duration of the COVID-19 declaration under Section 564(b)(1) of the Act, 21 U.S.C. section 360bbb-3(b)(1), unless the authorization is terminated or revoked.  Performed at [facility], [place] [address], [place], [place] [address]   Aerobic/Anaerobic Culture (surgical/deep wound)     Status: None   Collection Time: 05/23/20  3:57 PM   Specimen: Abscess  Result Value Ref Range Status   Specimen Description   Final    ABSCESS GALLBLADDER FOSSA FLUID Performed at [place] [address] [place], [place] [address]    Special Requests   Final    Normal Performed at [facility], [place] [address] [place], [place] [address]    Gram Stain   Final    RARE WBC PRESENT,BOTH PMN AND MONONUCLEAR NO ORGANISMS SEEN    Culture   Final    RARE ENTEROCOCCUS FAECALIS NO ANAEROBES ISOLATED Performed at [facility], [address], [place], [place] [address]    Report Status 05/28/2020 FINAL  Final   Organism ID, Bacteria ENTEROCOCCUS FAECALIS  Final      Susceptibility   Enterococcus faecalis - MIC*    AMPICILLIN <=2 SENSITIVE Sensitive     VANCOMYCIN 1 SENSITIVE Sensitive     GENTAMICIN SYNERGY SENSITIVE Sensitive     * RARE ENTEROCOCCUS FAECALIS     Labs: BNP (last 3 results) No results for input(s): BNP in the last 8760 hours. Basic Metabolic Panel: Recent Labs  Lab 05/25/20 0541 05/27/20 0610 05/28/20 0522 05/29/20 0924 05/30/20 0818  NA 138 140 138 141 139  K 4.2 3.9 3.7 4.0 3.9  CL 104 103 103 108 109  CO2 24 25 23 23  21*  GLUCOSE 102* 203* 181* 228* 256*  BUN 14 12 13 15  21*  CREATININE 0.55 0.66 0.55 0.64 0.65  CALCIUM 8.5* 9.1 9.0 8.8* 8.7*   Liver Function Tests: Recent Labs  Lab 05/25/20 0541 05/27/20 0610 05/28/20 0522  05/29/20 0924 05/30/20 0818  AST 47* 42* 36 44* 36  ALT 34 30 26 30 25   ALKPHOS 179* 175* 139* 125 139*  BILITOT 0.6 0.4 0.4 0.2* 0.5  PROT 6.5 7.3 6.5 6.8 6.2*  ALBUMIN 3.1* 3.4* 3.2* 3.3* 3.1*   Recent Labs  Lab 05/24/20 0605 05/27/20 0610 05/28/20 0522  LIPASE 51 43 28   No results for input(s): AMMONIA in the last 168 hours. CBC: Recent Labs  Lab 05/24/20 0605 05/25/20 0541 05/27/20 0610 05/28/20 0522 05/29/20 0924 05/30/20 0818  WBC 6.8 6.9 6.8 6.1 4.9 7.9  NEUTROABS 4.5 4.6  --  3.7 2.8 6.0  HGB 9.5* 9.3* 10.8* 9.9* 10.3* 9.6*  HCT 33.8* 32.2* 36.8 33.7* 35.4* 32.7*  MCV 88.7 88.0 87.0 87.5  88.5 86.7  PLT 228 224 228 239 227 218   Cardiac Enzymes: No results for input(s): CKTOTAL, CKMB, CKMBINDEX, TROPONINI in the last 168 hours. BNP: Invalid input(s): POCBNP CBG: Recent Labs  Lab 05/29/20 1956 05/30/20 0034 05/30/20 0438 05/30/20 0754 05/30/20 1209  GLUCAP 254* 392* 288* 249* 281*   D-Dimer No results for input(s): DDIMER in the last 72 hours. Hgb A1c No results for input(s): HGBA1C in the last 72 hours. Lipid Profile No results for input(s): CHOL, HDL, LDLCALC, TRIG, CHOLHDL, LDLDIRECT in the last 72 hours. Thyroid function studies No results for input(s): TSH, T4TOTAL, T3FREE, THYROIDAB in the last 72 hours.  Invalid input(s): FREET3 Anemia work up No results for input(s): VITAMINB12, FOLATE, FERRITIN, TIBC, IRON, RETICCTPCT in the last 72 hours. Urinalysis    Component Value Date/Time   COLORURINE AMBER (A) 05/21/2020 2122   APPEARANCEUR TURBID (A) 05/21/2020 2122   LABSPEC 1.027 05/21/2020 2122   PHURINE 5.0 05/21/2020 2122   GLUCOSEU >=500 (A) 05/21/2020 2122   HGBUR NEGATIVE 05/21/2020 2122   [place] NEGATIVE 05/21/2020 2122   KETONESUR 5 (A) 05/21/2020 2122   PROTEINUR NEGATIVE 05/21/2020 2122   NITRITE NEGATIVE 05/21/2020 2122   LEUKOCYTESUR TRACE (A) 05/21/2020 2122   Sepsis Labs Invalid input(s): PROCALCITONIN,  WBC,   LACTICIDVEN Microbiology Recent Results (from the past 240 hour(s))  Urine culture     Status: Abnormal   Collection Time: 05/20/20  4:56 PM   Specimen: Urine, Random  Result Value Ref Range Status   Specimen Description   Final    URINE, RANDOM Performed at [facility], [place] [address], [place], [place] [address]    Special Requests   Final    NONE Performed at [facility], [place] [address], [place], [place] [address]    Culture >=100,000 COLONIES/mL KLEBSIELLA PNEUMONIAE (A)  Final   Report Status 05/23/2020 FINAL  Final   Organism ID, Bacteria KLEBSIELLA PNEUMONIAE (A)  Final      Susceptibility   Klebsiella pneumoniae - MIC*    AMPICILLIN RESISTANT Resistant     CEFAZOLIN <=4 SENSITIVE Sensitive     CEFEPIME <=0.12 SENSITIVE Sensitive     CEFTRIAXONE <=0.25 SENSITIVE Sensitive     CIPROFLOXACIN <=0.25 SENSITIVE Sensitive     GENTAMICIN <=1 SENSITIVE Sensitive     IMIPENEM 0.5 SENSITIVE Sensitive     NITROFURANTOIN 32 SENSITIVE Sensitive     TRIMETH/SULFA <=20 SENSITIVE Sensitive     AMPICILLIN/SULBACTAM <=2 SENSITIVE Sensitive     PIP/TAZO <=4 SENSITIVE Sensitive     * >=100,000 COLONIES/mL KLEBSIELLA PNEUMONIAE  Resp Panel by RT-PCR (Flu A&B, Covid) Nasopharyngeal Swab     Status: None   Collection Time: 05/20/20  9:23 PM   Specimen: Nasopharyngeal Swab; Nasopharyngeal(NP) swabs in vial transport medium  Result Value Ref Range Status   SARS Coronavirus 2 by RT PCR NEGATIVE NEGATIVE Final    Comment: (NOTE) SARS-CoV-2 target nucleic acids are NOT DETECTED.  The SARS-CoV-2 RNA is generally detectable in upper respiratory specimens during the acute phase of infection. The lowest concentration of SARS-CoV-2 viral copies this assay can detect is 138 copies/mL. A negative result does not preclude SARS-Cov-2 infection and should not be used as the sole basis for treatment or other patient management decisions. A negative result may occur  with  improper specimen collection/handling, submission of specimen other than nasopharyngeal swab, presence of viral mutation(s) within the areas targeted by this assay, and inadequate number of viral copies(<138 copies/mL).  A negative result must be combined with clinical observations, patient history, and epidemiological information. The expected result is Negative.  Fact Sheet for Patients:  EntrepreneurPulse.com.au  Fact Sheet for Healthcare Providers:  IncredibleEmployment.be  This test is no t yet approved or cleared by the [place] FDA and  has been authorized for detection and/or diagnosis of SARS-CoV-2 by FDA under an Emergency Use Authorization (EUA). This EUA will remain  in effect (meaning this test can be used) for the duration of the COVID-19 declaration under Section 564(b)(1) of the Act, 21 U.S.C.section 360bbb-3(b)(1), unless the authorization is terminated  or revoked sooner.       Influenza A by PCR NEGATIVE NEGATIVE Final   Influenza B by PCR NEGATIVE NEGATIVE Final    Comment: (NOTE) The Xpert Xpress SARS-CoV-2/FLU/RSV plus assay is intended as an aid in the diagnosis of influenza from Nasopharyngeal swab specimens and should not be used as a sole basis for treatment. Nasal washings and aspirates are unacceptable for Xpert Xpress SARS-CoV-2/FLU/RSV testing.  Fact Sheet for Patients: EntrepreneurPulse.com.au  Fact Sheet for Healthcare Providers: IncredibleEmployment.be  This test is not yet approved or cleared by the [place] FDA and has been authorized for detection and/or diagnosis of SARS-CoV-2 by FDA under an Emergency Use Authorization (EUA). This EUA will remain in effect (meaning this test can be used) for the duration of the COVID-19 declaration under Section 564(b)(1) of the Act, 21 U.S.C. section 360bbb-3(b)(1), unless the authorization is terminated  or revoked.  Performed at [facility], [place] [address], [place], [place] [address]   Aerobic/Anaerobic Culture (surgical/deep wound)     Status: None   Collection Time: 05/23/20  3:57 PM   Specimen: Abscess  Result Value Ref Range Status   Specimen Description   Final    ABSCESS GALLBLADDER FOSSA FLUID Performed at [place] [address] [place], [place] [address]    Special Requests   Final    Normal Performed at [facility], [place] [address] [place], [place] [address]    Gram Stain   Final    RARE WBC PRESENT,BOTH PMN AND MONONUCLEAR NO ORGANISMS SEEN    Culture   Final    RARE ENTEROCOCCUS FAECALIS NO ANAEROBES ISOLATED Performed at [facility], [address] [place], [place] [address]    Report Status 05/28/2020 FINAL  Final   Organism ID, Bacteria ENTEROCOCCUS FAECALIS  Final      Susceptibility   Enterococcus faecalis - MIC*    AMPICILLIN <=2 SENSITIVE Sensitive     VANCOMYCIN 1 SENSITIVE Sensitive     GENTAMICIN SYNERGY SENSITIVE Sensitive     * RARE ENTEROCOCCUS FAECALIS     Time coordinating discharge: 35 minutes  SIGNED:   Cordelia Poche, MD [facility] 05/30/2020, 1:25 PM

## 2020-05-30 NOTE — Care Management Important Message (Signed)
Important Message  Patient Details IM Letter given to the Patient. Name: Kim Hicks MRN: [id number] Date of Birth: June 23, 1965   Medicare Important Message Given:  Yes     Kerin Salen 05/30/2020, 9:00 AM

## 2020-05-30 NOTE — Progress Notes (Signed)
Patient discharged to home with family, discharge instructions reviewed with patient who verbalized understanding. New RX sent to pharmacy electronically.

## 2020-05-30 NOTE — Discharge Instructions (Signed)
Kim Hicks,  You were in the hospital because of an infection of fluid around the area where your gallbladder was. You have been started on antibiotics and have a drain placed.

## 2020-05-31 ENCOUNTER — Telehealth: Payer: Self-pay

## 2020-05-31 NOTE — Telephone Encounter (Signed)
Left message for patient to return my call.

## 2020-05-31 NOTE — Telephone Encounter (Signed)
-----   Message from [name], MD sent at 05/29/2020 12:02 PM EST ----- Kim Hicks,  This patient needs an outpatient ERCP for stent removal / stent exchange schedule in about 2 months.   [facility] Set[facility] is changing one of my Thursday March office mornings to a [facility] hospital block so we can place patients waiting to schedule or reschedule in that block.   Thanks,   MS

## 2020-05-31 NOTE — Telephone Encounter (Signed)
Inbound call from patient returning your call.

## 2020-06-01 ENCOUNTER — Other Ambulatory Visit: Payer: Self-pay | Admitting: Gastroenterology

## 2020-06-03 ENCOUNTER — Other Ambulatory Visit: Payer: Self-pay

## 2020-06-03 DIAGNOSIS — K839 Disease of biliary tract, unspecified: Secondary | ICD-10-CM

## 2020-06-03 NOTE — Telephone Encounter (Signed)
Left message for patient to return my call.

## 2020-06-03 NOTE — Telephone Encounter (Signed)
Patient returned my call and patient is scheduled for ERCP with stent removal/exchnage on 07/25/20 at 7:30am. Covid test scheduled for 07/22/20 at 9:30am. Informed patient that since she does not have my chart I will print her instructions and mail them to her. Informed patient if she does not receive them in the mail to contact office or if she has any questions to contact office. Patient verbalized understanding.

## 2020-06-07 ENCOUNTER — Ambulatory Visit (HOSPITAL_COMMUNITY)
Admission: RE | Admit: 2020-06-07 | Discharge: 2020-06-07 | Disposition: A | Discharge status: Home / Self Care | Payer: Medicare Other | Source: Ambulatory Visit | Attending: Internal Medicine | Admitting: Internal Medicine

## 2020-06-07 ENCOUNTER — Other Ambulatory Visit: Payer: Self-pay

## 2020-06-07 ENCOUNTER — Encounter (HOSPITAL_COMMUNITY): Payer: Self-pay

## 2020-06-07 DIAGNOSIS — K651 Peritoneal abscess: Secondary | ICD-10-CM | POA: Diagnosis not present

## 2020-06-07 MED ORDER — IOHEXOL 300 MG/ML  SOLN
100.0000 mL | Freq: Once | INTRAMUSCULAR | Status: AC | PRN
  Administered 2020-06-07: 100 mL via INTRAVENOUS

## 2020-06-07 MED ORDER — IOHEXOL 9 MG/ML PO SOLN
500.0000 mL | ORAL | Status: AC
  Administered 2020-06-07: 1000 mL via ORAL

## 2020-06-07 MED ORDER — IOHEXOL 9 MG/ML PO SOLN
ORAL | Status: AC
  Filled 2020-06-07: qty 1000

## 2020-06-10 ENCOUNTER — Inpatient Hospital Stay: Payer: Medicare Other | Admitting: Infectious Diseases

## 2020-06-13 ENCOUNTER — Encounter: Payer: Self-pay | Admitting: Internal Medicine

## 2020-06-13 ENCOUNTER — Telehealth: Payer: Self-pay | Admitting: Nurse Practitioner

## 2020-06-13 ENCOUNTER — Other Ambulatory Visit: Payer: Self-pay

## 2020-06-13 ENCOUNTER — Ambulatory Visit (INDEPENDENT_AMBULATORY_CARE_PROVIDER_SITE_OTHER): Payer: Medicare Other | Admitting: Internal Medicine

## 2020-06-13 VITALS — BP 113/73 | HR 109 | Temp 98.3°F | Wt 171.0 lb

## 2020-06-13 DIAGNOSIS — R8271 Bacteriuria: Secondary | ICD-10-CM

## 2020-06-13 DIAGNOSIS — K651 Peritoneal abscess: Secondary | ICD-10-CM

## 2020-06-13 DIAGNOSIS — K668 Other specified disorders of peritoneum: Secondary | ICD-10-CM

## 2020-06-13 MED ORDER — AMOXICILLIN-POT CLAVULANATE 875-125 MG PO TABS: 1.0000 | ORAL_TABLET | Freq: Two times a day (BID) | ORAL | 0 refills | Status: DC

## 2020-06-13 NOTE — Patient Instructions (Addendum)
You have follow up with GI doctor on 07/02/20 @ 1030  It appears you are doing a lot better   The next step is await GI evaluation to see if there is ongoing leakage.   As it appears there might be a little fluid around the drain site, I am continuing you on 2 more weeks of antibiotics   Please do blood test today    See me in 3-4 weeks

## 2020-06-13 NOTE — Progress Notes (Signed)
[facility]  Patient Active Problem List   Diagnosis Date Noted  . Abscess of gallbladder   . Migration of biliary stent   . Malnutrition of moderate degree 05/23/2020  . Abdominal pain 05/21/2020  . Biloma 05/20/2020  . Biliary sludge   . Encounter for removal of biliary stent   . Diabetic peripheral neuropathy ([place]) 09/14/2019  . Gait disturbance 09/14/2019  . NASH (nonalcoholic steatohepatitis) 08/21/2019  . Bile leak   . Other cirrhosis of liver ([place]) 08/04/2019  . DYSPHAGIA UNSPECIFIED 03/04/2009  . GERD 02/18/2009  . PERSISTENT VOMITING 02/18/2009  . CHEST PAIN 02/18/2009  . NAUSEA AND VOMITING 02/18/2009  . DEHYDRATION 07/19/2007  . GASTROENTERITIS 07/19/2007  . FIBROIDS, UTERUS 05/02/2007  . DEPRESSION 05/02/2007  . MIGRAINE HEADACHE 05/02/2007  . Hypertension associated with diabetes ([place]) 05/02/2007  . HEMORRHOIDS 05/02/2007  . Chronic constipation 05/02/2007  . IBS 05/02/2007  . PSORIASIS 05/02/2007  . PITUITARY NEOPLASM, HX OF 05/02/2007  . LIVER FUNCTION TESTS, ABNORMAL, HX OF 05/02/2007  . Insulin dependent type 2 diabetes mellitus ([place]) 02/04/2007  . Hyperlipidemia associated with type 2 diabetes mellitus ([place]) 02/04/2007  . SINUSITIS, ACUTE 02/04/2007  . ABDOMINAL PAIN, RIGHT UPPER QUADRANT 02/04/2007      Subjective:    Patient ID: Kim Hicks, female    DOB: [birth year], 55 y.o.   MRN: [id number]  Chief Complaint  Patient presents with  . Follow-up    HPI:  Kim Hicks is a 55 y.o. female pmh liver cirrhosis, insulin-dependent type 2 diabetes, hypertension, hyperlipidemia, anemia of chronic disease, depression/anxiety, recent lap chole 07/2019 for chronic cholecystitis complicated by persistent bile leak s/p biliary stent, admitted 12/27 for right sided abd/flank pain found to have infected biloma. She is here in ID clinic for follow up  06/13/20 id f/u Some pain still stable (moderate) around the perc  drain No f/c Eating well, gainin 20 pounds since discharge No diarrhea Drainage now clear straw color   Background from 05/30/20 consult by me: ------------------------  Review of chart: S/p subtotal cholecystectomy 05/6604 complicated by cystic duct stump leak ERCP 08/07/2019 with placement initial stent into CBD, along with sphinterotomy ERCP 11/07/2019 for stent exchange; persistent bile leak noted ERCP 05/13/2020 for persistent bile leak; previous stent clogged/removed and new metal stent placed in CBD  Post 12/20 procedure, patient developed progressive right flank/abd pain. She denies fever/chill, but endorsed nausea/poor appetite. Denies diarrhea. Denies increased abd girth or weight gain/legs swelling  Seen by GI in clinic 05/20/2020. Previsit labs showed alk phos 244, AST 44, ALT 41, total bilirubin 0.3, lipase 101, WBC 6.2, hemoglobin 9.6. She was referred from gi clinic for admission to r/o complication of bile leak vs pancreatitis vs others   Hospital course: 12/27 admission: afebrile; hds Urinalysis positive nitrites, 0-5 RBC/hpf, 21-50 WBC, many bacteria on microscopy ucx ultimately klebsiella Wbc 6.2 CT abdomen/pelvis with contrast showed a multilocular fluid collection within the gallbladder bed measuring 4.6 x 2.6 cm which could be due to biloma or postprocedural seroma. No definite evidence of pancreatitis was seen. Findings suggestive of cirrhosis and portal hypertension also noted. Started empirically on piptazo for concern biliary sepsis 12/28 hida scan shows no evidence bile leak 12/30 s/p IR placement percutaneous drain catheter. The content does show bile and suggestive of persistent bile leak 01/03 ercp to remove metal stent which had migrated and placement of new plastic stent for ongoing bile leak  She never had  fever here. And her wbc has been around 6's. The perc biliary drain is draining well   Allergies  Allergen Reactions  . Talwin  [Pentazocine]     Turned blue around lips and face, rash all over body      Outpatient Medications Prior to Visit  Medication Sig Dispense Refill  . albuterol (VENTOLIN HFA) 108 (90 Base) MCG/ACT inhaler Inhale 2 puffs into the lungs every 6 (six) hours as needed for shortness of breath or wheezing.    . brexpiprazole (REXULTI) 2 MG TABS tablet Take 2 mg by mouth at bedtime.    . calcium carbonate (OSCAL) 1500 (600 Ca) MG TABS tablet Take 600 mg of elemental calcium by mouth daily with breakfast.    . Dulaglutide 4.5 MG/0.5ML SOPN Inject 4 mg into the skin once a week. Monday    . Empagliflozin-metFORMIN HCl ER (SYNJARDY XR) 25-1000 MG TB24 Take 1 tablet by mouth daily before breakfast.     . ezetimibe (ZETIA) 10 MG tablet Take 10 mg by mouth daily.    . feeding supplement (ENSURE ENLIVE / ENSURE PLUS) LIQD Take 237 mLs by mouth 3 (three) times daily between meals.    . ferrous sulfate 325 (65 FE) MG tablet Take 325 mg by mouth daily with breakfast.    . Insulin Glargine (BASAGLAR KWIKPEN) 100 UNIT/ML Inject 38 Units into the skin at bedtime.    [name] LINZESS 290 MCG CAPS capsule Take 290 mcg by mouth every morning.    [name] lisinopril (ZESTRIL) 20 MG tablet TAKE 1 TABLET BY MOUTH EVERY DAY (Patient taking differently: Take 20 mg by mouth daily.) 90 tablet 1  . metFORMIN (GLUCOPHAGE) 1000 MG tablet Take 1,000 mg by mouth at bedtime.    . Omega-3 Fatty Acids (OMEGA 3 PO) Take 500 mg by mouth daily.     . prazosin (MINIPRESS) 2 MG capsule Take 4 mg by mouth at bedtime.    . pregabalin (LYRICA) 100 MG capsule TAKE 1 CAPSULE BY MOUTH THREE TIMES A DAY (Patient taking differently: Take 100 mg by mouth 3 (three) times daily.) 90 capsule 3  . QUEtiapine (SEROQUEL) 400 MG tablet Take 800 mg by mouth at bedtime.    . simvastatin (ZOCOR) 10 MG tablet Take 10 mg by mouth daily before breakfast.   6  . topiramate (TOPAMAX) 200 MG tablet Take 200 mg by mouth 2 (two) times daily.    [name] vortioxetine HBr  ([name]) 20 MG TABS tablet Take 20 mg by mouth at bedtime.    . ziprasidone ([name]) 20 MG capsule Take 20 mg by mouth at bedtime.    . polyethylene glycol (MIRALAX / GLYCOLAX) 17 g packet Take 17 g by mouth daily. (Patient not taking: Reported on 06/13/2020)     No facility-administered medications prior to visit.     Social History   Socioeconomic History  . Marital status: Married    Spouse name: Not on file  . Number of children: Not on file  . Years of education: Not on file  . Highest education level: Not on file  Occupational History  . Not on file  Tobacco Use  . Smoking status: Never Smoker  . Smokeless tobacco: Never Used  Vaping Use  . Vaping Use: Never used  Substance and Sexual Activity  . Alcohol use: Never  . Drug use: Never  . Sexual activity: Not on file  Other Topics Concern  . Not on file  Social History Narrative  . Not on  file   Social Determinants of Health   Financial Resource Strain: Not on file  Food Insecurity: Not on file  Transportation Needs: Not on file  Physical Activity: Not on file  Stress: Not on file  Social Connections: Not on file  Intimate Partner Violence: Not on file      Review of Systems   other ros negative  Objective:    BP 113/73   Pulse (!) 109   Temp 98.3 F (36.8 C) (Oral)   Wt 171 lb (77.6 kg)   BMI 29.35 kg/m  Nursing note and vital signs reviewed.  Physical Exam     Obese, no distress, pleasant Atraumatic; conj clear cv rrr no mrg Lungs clear abd soft, obese, mildly tender around perc cath. No erythema/fluctuance/purulence. The driainage bag is showing straw color fluid Neuro nonfocal Ext no edema  Serology: hiv screen negative  Micro: 12/27 ucx kleb pna (r amp, pansensitive otherwise) 12/30 perc drain fluid rare e faecalis  Imaging: 12/27 ct abd/pelv with contrast I personally reviewed and noted fluid collection around previously where gallbladder bed was. I also noted hyperintense  tubular structure ending in the duodenum from cbd  Multilocular fluid collection within the gallbladder bed measuring 4.6 x 2.6 cm which could be due to biloma, or postprocedural seroma. Mild inflammatory changes seen within the porta hepatis No definite evidence of pancreatitis Findings suggestive of cirrhosis and portal hypertension  12/28 hida scan No evidence bile leak  1/01 mra 1. Status post cholecystectomy with percutaneous drainage catheter in the gallbladder fossa, without residual fluid collection around the catheter tip. 2. Hepatic cirrhosis with developing hepatic fibrosis, most confluent in the right lobe of the liver.  1/03 ercp 1. Removal of existing metallic biliary stent, balloon sweeping of the bile duct and placement of a plastic biliary stent.  Assessment & Plan:   Problem List Items Addressed This Visit   None   Visit Diagnoses    Biloma following surgery, subsequent encounter    -  Primary   Relevant Orders   CBC w/Diff   Comprehensive metabolic panel   C-reactive protein   Intra-abdominal abscess ([place])       Relevant Orders   CBC w/Diff   Comprehensive metabolic panel   C-reactive protein     Abx: 1/06-c amox-clav  1/02-6 amp-sulbactam 12/28 and 12/30 fluconazole 12/27-1/02 pip-tazo 12/28 nitrofurantoin                                                        Assessment: 55 y.o. female pmh liver cirrhosis (presumed nash), insulin-dependent type 2 diabetes, hypertension, hyperlipidemia, anemia of chronic disease, depression/anxiety, recent lap chole 07/2019 for chronic cholecystitis complicated by persistent bile leak s/p biliary stent, admitted 12/27 for right sided abd/flank pain found to have infected biloma  No evidence peritonitis Patient is now s/p perc drain placement on 12/30; cx rare efaecalis. No sign of sepsis She has what appears to be assymptomatic bacteriuria On 1/03 had ercp with replacement of migrated stent  At this  point she appears to have source control,  However this is not cholecystitis per say but rather an "abscess in the gallbladder fossa." Would finish 14 day course abx. Oral abx would be fine. She'll need close f/u and repeat imaging with GI outpatient  06/13/20 id f/u Appears to  be doing better Reviewed abd ct from 1/14, minimal if at all any fluid around where the drain is. No abx complication. Will have take 2 more weeks of abx and discuss with gi to see if ongoing leakage around gall bladder fossa    Plan: 1. restart augmentin 875 mg po bid for 2 more weeks 2. Please call gi doc and see if they could see you earlier 3. Labs today to assess control of infection 4. Follow up with me in 3-4 weeks   Follow-up: Return in about 4 weeks (around 07/11/2020).      Jabier Mutton, [facility] [place] -- -- pager   [phone] cell 06/13/2020, 11:13 AM

## 2020-06-13 NOTE — Telephone Encounter (Signed)
Patient called states the area where the tube is hurts really bad is asking for pain medication

## 2020-06-14 ENCOUNTER — Other Ambulatory Visit: Payer: Self-pay | Admitting: Internal Medicine

## 2020-06-14 ENCOUNTER — Telehealth: Payer: Self-pay

## 2020-06-14 DIAGNOSIS — K668 Other specified disorders of peritoneum: Secondary | ICD-10-CM

## 2020-06-14 LAB — CBC WITH DIFFERENTIAL/PLATELET
Absolute Monocytes: 511 cells/uL (ref 200–950)
Basophils Absolute: 28 cells/uL (ref 0–200)
Basophils Relative: 0.4 %
Eosinophils Absolute: 357 cells/uL (ref 15–500)
Eosinophils Relative: 5.1 %
HCT: 33.5 % — ABNORMAL LOW (ref 35.0–45.0)
Hemoglobin: 10 g/dL — ABNORMAL LOW (ref 11.7–15.5)
Lymphs Abs: 1568 cells/uL (ref 850–3900)
MCH: 25.1 pg — ABNORMAL LOW (ref 27.0–33.0)
MCHC: 29.9 g/dL — ABNORMAL LOW (ref 32.0–36.0)
MCV: 84.2 fL (ref 80.0–100.0)
MPV: 12.2 fL (ref 7.5–12.5)
Monocytes Relative: 7.3 %
Neutro Abs: 4536 cells/uL (ref 1500–7800)
Neutrophils Relative %: 64.8 %
Platelets: 160 10*3/uL (ref 140–400)
RBC: 3.98 10*6/uL (ref 3.80–5.10)
RDW: 14.6 % (ref 11.0–15.0)
Total Lymphocyte: 22.4 %
WBC: 7 10*3/uL (ref 3.8–10.8)

## 2020-06-14 LAB — C-REACTIVE PROTEIN: CRP: 3.3 mg/L (ref <8.0)

## 2020-06-14 NOTE — Telephone Encounter (Signed)
Dr. Fuller Plan, please see ID's note from yesterday.  Does she need sooner follow up with [place] or surgery?  Patient requesting pain meds for T-tube.

## 2020-06-14 NOTE — Telephone Encounter (Signed)
Left message for patient to call back  

## 2020-06-14 NOTE — Telephone Encounter (Signed)
She should contact IR regarding tube related concerns, problems. Could tube be removed soon?

## 2020-06-14 NOTE — Telephone Encounter (Signed)
Left message for patient to please call back

## 2020-06-14 NOTE — Telephone Encounter (Addendum)
I spoke with Anderson Malta of IR at [facility]. [phone] .  Anderson Malta will have ID note from yesterday,  MD review recent CT and IR note and they will determine timing of tube removal and have patient come in for a tube check.

## 2020-06-20 ENCOUNTER — Telehealth: Payer: Self-pay | Admitting: Nurse Practitioner

## 2020-06-20 ENCOUNTER — Ambulatory Visit
Admission: RE | Admit: 2020-06-20 | Discharge: 2020-06-20 | Disposition: A | Discharge status: Home / Self Care | Payer: Medicare Other | Source: Ambulatory Visit | Attending: Radiology | Admitting: Radiology

## 2020-06-20 ENCOUNTER — Encounter: Payer: Self-pay | Admitting: Radiology

## 2020-06-20 ENCOUNTER — Ambulatory Visit
Admission: RE | Admit: 2020-06-20 | Discharge: 2020-06-20 | Disposition: A | Discharge status: Home / Self Care | Payer: Medicare Other | Source: Ambulatory Visit | Attending: Internal Medicine | Admitting: Internal Medicine

## 2020-06-20 ENCOUNTER — Telehealth: Payer: Self-pay

## 2020-06-20 DIAGNOSIS — K668 Other specified disorders of peritoneum: Secondary | ICD-10-CM

## 2020-06-20 HISTORY — PX: IR RADIOLOGIST EVAL & MGMT: IMG5224

## 2020-06-20 NOTE — Telephone Encounter (Signed)
Her persistent bile leak is not typical. She needs contact IR for all tube related problems: pain, drainage, insertion site issues, etc.  Please advise her I am reviewing her care with Dr. Rush Landmark who is our local biliary expert regarding options to address her ongoing bile leak despite months of biliary stenting.  I'm also sending a message to her surgeon, Dr. Kae Heller, for her input.

## 2020-06-20 NOTE — Telephone Encounter (Signed)
Thanks Tammy 

## 2020-06-20 NOTE — Progress Notes (Signed)
Per my earlier note.

## 2020-06-20 NOTE — Telephone Encounter (Signed)
LMOM to call back

## 2020-06-20 NOTE — Telephone Encounter (Signed)
LMOM for patient to call back.

## 2020-06-20 NOTE — Telephone Encounter (Signed)
Spoke to patient who called the office c/o pain from her RT side percutaneous drainage catheter site following her sinus /fist tube check imaging. She reports the pain as a "pulling,pressure sensation" She is having a high volume drainage from the site aprox 400 cc per day.The increase drainage coming from the site may be causing the "pulling,pressure"discomfort. IR replaced her bandage today and she reports no signs or symptoms of infection. She is currently taking Augmentin 875 mg ordered on 06/14/20 for 2 weeks.Dr Fuller Plan please review imaging from earlier today. Patient was advised to contact IR drain clinic if she continues to have pain at the percutaneous site. All questions answered,patient voiced understanding.

## 2020-06-20 NOTE — Progress Notes (Signed)
Referring Physician(s): Dr. Lyndel Safe  Chief Complaint: The patient is seen in follow up today s/p Gallbladder fossa drain placement 05/23/20  History of present illness:  Kim Hicks is a 55 y.o. female with a medical history significant for anxiety/depression, DM, HTN and cirrhosis. She had a cholecystectomy March 2021 and developed a post-op bile leak. An 8.5 Fr biliary stent was placed a few days after surgery but the bile leak persisted so a 10 Fr. stent was placed. She presented to the hospital 05/13/20 for an outpatient follow up ERCP with possible biliary stent removal. During the procedure they discovered the stent to be clogged and it was exchanged for a metal stent.   On 05/20/20, she presented to the [facility] ED with complaints of right flank pain and nausea that started not long after her procedure. CT imaging obtained showed a multilocular fluid collection within the gallbladder bed measuring 4.6 x 2.6 cm which could be due to biloma or postprocedural seroma. No definite evidence of pancreatitis was seen. Findings suggestive of cirrhosis and portal hypertension also noted.  IR was consulted at that time for a possible aspiration of this fluid collection. IR deemed the fluid collection to be too small and difficult to access percutaneously. She had a HIDA scan 05/21/20 which was negative for ongoing bile leak. The patient's pain continued to be severe and persistent and IR was re-consulted for possible aspiration/drain placement - drain placed 05/23/20 by Dr. Kathlene Cote.   Kim Hicks presents today to the outpatient clinic for drain follow up. She endorses continuous moderate pain in the RUQ. Daily drain output averages 400 ml/day and she is still on oral antibiotics. She is afebrile.    Past Medical History:  Diagnosis Date  . Anxiety   . Cataract   . Cirrhosis of liver ([place])   . Depression   . Diabetes mellitus without complication ([place])   . Dysrhythmia   . Edema   . Family  history of adverse reaction to anesthesia    mother had trouble waking up after surgery  . Foot fracture, left   . Headache    MIGRAINES  . Hyperlipidemia   . Hypertension   . Neuromuscular disorder ([place])    NEUROPATHY-both hands and feet  . Palpitations   . Wheezing     Past Surgical History:  Procedure Laterality Date  . ABDOMINAL HYSTERECTOMY     one ovary left  . BILIARY STENT PLACEMENT N/A 08/07/2019   Procedure: BILIARY STENT PLACEMENT;  Surgeon: [name], MD;  Location: [facility];  Service: Endoscopy;  Laterality: N/A;  . BILIARY STENT PLACEMENT N/A 11/07/2019   Procedure: BILIARY STENT PLACEMENT;  Surgeon: [name], MD;  Location: [facility];  Service: Endoscopy;  Laterality: N/A;  . BILIARY STENT PLACEMENT N/A 05/13/2020   Procedure: BILIARY STENT PLACEMENT;  Surgeon: [name], MD;  Location: [facility];  Service: Endoscopy;  Laterality: N/A;  . BILIARY STENT PLACEMENT N/A 05/27/2020   Procedure: BILIARY STENT PLACEMENT;  Surgeon: [name], MD;  Location: [facility];  Service: Endoscopy;  Laterality: N/A;  . CATARACT EXTRACTION W/PHACO Right 01/11/2018   Procedure: CATARACT EXTRACTION PHACO AND INTRAOCULAR LENS PLACEMENT (IOC);  Surgeon: [name], MD;  Location: [facility];  Service: Ophthalmology;  Laterality: Right;  [place] 00:57.5 AP% 15.0 CDE 8.61 Fluid Pack Lot # [id number]  . CATARACT EXTRACTION W/PHACO Left 02/15/2018   Procedure: CATARACT EXTRACTION PHACO AND INTRAOCULAR LENS PLACEMENT (IOC);  Surgeon: [name], MD;  Location: [facility];  Service: Ophthalmology;  Laterality: Left;  [place] 00:51 AP% 13.2 CDE 6.82 Fluid p ack lot # [id number]  . CHOLECYSTECTOMY N/A 08/04/2019   Procedure: LAPAROSCOPIC CHOLECYSTECTOMY;  Surgeon: [name], MD;  Location: [facility];  Service: General;  Laterality: N/A;  . COLONOSCOPY    . ENDOSCOPIC RETROGRADE CHOLANGIOPANCREATOGRAPHY (ERCP) WITH PROPOFOL N/A 11/07/2019   Procedure:  ENDOSCOPIC RETROGRADE CHOLANGIOPANCREATOGRAPHY (ERCP) WITH PROPOFOL;  Surgeon: [name], MD;  Location: [facility];  Service: Endoscopy;  Laterality: N/A;  . ENDOSCOPIC RETROGRADE CHOLANGIOPANCREATOGRAPHY (ERCP) WITH PROPOFOL N/A 05/13/2020   Procedure: ENDOSCOPIC RETROGRADE CHOLANGIOPANCREATOGRAPHY (ERCP) WITH PROPOFOL;  Surgeon: [name], MD;  [facility]: [facility];  Service: Endoscopy;  Laterality: N/A;  . ERCP N/A 08/07/2019   Procedure: ENDOSCOPIC RETROGRADE CHOLANGIOPANCREATOGRAPHY (ERCP);  Surgeon: [name], MD;  Location: [facility];  Service: Endoscopy;  Laterality: N/A;  . ERCP N/A 05/27/2020   Procedure: ENDOSCOPIC RETROGRADE CHOLANGIOPANCREATOGRAPHY (ERCP);  Surgeon: [name], MD;  Location: [facility];  Service: Endoscopy;  Laterality: N/A;  . IR RADIOLOGIST EVAL & MGMT  06/20/2020  . RCR Bilateral   . REMOVAL OF STONES  08/07/2019   Procedure: REMOVAL OF STONES;  Surgeon: [name], MD;  Location: [facility];  Service: Endoscopy;;  balloon sweep, no stones  . REMOVAL OF STONES  05/27/2020   Procedure: REMOVAL OF STONES;  Surgeon: [name], MD;  Location: [facility];  Service: Endoscopy;;  . [name]  08/07/2019   Procedure: SPHINCTEROTOMY;  Surgeon: [name], MD;  [facility]: [facility];  Service: Endoscopy;;  . STENT REMOVAL  11/07/2019   Procedure: STENT REMOVAL;  Surgeon: [name], MD;  [facility]: [facility];  Service: Endoscopy;;  . STENT REMOVAL  05/13/2020   Procedure: STENT REMOVAL;  Surgeon: [name], MD;  [facility]: [facility];  Service: Endoscopy;;  . STENT REMOVAL  05/27/2020   Procedure: STENT REMOVAL;  Surgeon: [name], MD;  Location: [facility];  Service: Endoscopy;;    Allergies: [place]win [pentazocine]  Medications: Prior to Admission medications   Medication Sig Start Date End Date Taking? Authorizing Provider  albuterol (VENTOLIN HFA) 108 (90 Base) MCG/ACT inhaler Inhale  2 puffs into the lungs every 6 (six) hours as needed for shortness of breath or wheezing. 02/27/20   [provider]  amoxicillin-clavulanate (AUGMENTIN) 875-125 MG tablet Take 1 tablet by mouth 2 (two) times daily. 06/13/20   Vu, Johnny Bridge T, MD  brexpiprazole (REXULTI) 2 MG TABS tablet Take 2 mg by mouth at bedtime.    [provider]  calcium carbonate (OSCAL) 1500 (600 Ca) MG TABS tablet Take 600 mg of elemental calcium by mouth daily with breakfast.    [provider]  Dulaglutide 4.5 MG/0.5ML SOPN Inject 4 mg into the skin once a week. Monday 12/20/18   [provider]  Empagliflozin-metFORMIN HCl ER (SYNJARDY XR) 25-1000 MG TB24 Take 1 tablet by mouth daily before breakfast.  11/15/18   [provider]  ezetimibe (ZETIA) 10 MG tablet Take 10 mg by mouth daily. 04/25/20   [provider]  feeding supplement (ENSURE ENLIVE / ENSURE PLUS) LIQD Take 237 mLs by mouth 3 (three) times daily between meals. 05/30/20   Mariel Aloe, MD  ferrous sulfate 325 (65 FE) MG tablet Take 325 mg by mouth daily with breakfast.    [provider]  Insulin Glargine (BASAGLAR KWIKPEN) 100 UNIT/ML Inject 38 Units into the skin at bedtime. 03/20/20  [provider]  LINZESS 290 MCG CAPS capsule Take 290 mcg by mouth every morning. 08/16/19   [provider]  lisinopril (ZESTRIL) 20 MG tablet TAKE 1 TABLET BY MOUTH EVERY DAY Patient taking differently: Take 20 mg by mouth daily. 02/08/20   [name], MD  metFORMIN (GLUCOPHAGE) 1000 MG tablet Take 1,000 mg by mouth at bedtime. 02/27/20   [provider]  Omega-3 Fatty Acids (OMEGA 3 PO) Take 500 mg by mouth daily.     [provider]  polyethylene glycol (MIRALAX / GLYCOLAX) 17 g packet Take 17 g by mouth daily. Patient not taking: Reported on 06/13/2020 05/30/20   Mariel Aloe, MD  prazosin (MINIPRESS) 2 MG capsule Take 4 mg by mouth at bedtime.    [provider]   pregabalin (LYRICA) 100 MG capsule TAKE 1 CAPSULE BY MOUTH THREE TIMES A DAY Patient taking differently: Take 100 mg by mouth 3 (three) times daily. 02/12/20   [name], NP  QUEtiapine (SEROQUEL) 400 MG tablet Take 800 mg by mouth at bedtime.    [provider]  simvastatin (ZOCOR) 10 MG tablet Take 10 mg by mouth daily before breakfast.  12/13/17   [provider]  topiramate (TOPAMAX) 200 MG tablet Take 200 mg by mouth 2 (two) times daily. 04/12/20   [provider]  vortioxetine HBr ([name]) 20 MG TABS tablet Take 20 mg by mouth at bedtime.    [provider]  ziprasidone ([name]) 20 MG capsule Take 20 mg by mouth at bedtime.    [provider]     Family History  Problem Relation Age of Onset  . Diabetes Mother   . Heart failure Mother   . Depression Mother   . Colon cancer Neg Hx   . Stomach cancer Neg Hx   . Esophageal cancer Neg Hx   . Pancreatic cancer Neg Hx     Social History   Socioeconomic History  . Marital status: Married    Spouse name: Not on file  . Number of children: Not on file  . Years of education: Not on file  . Highest education level: Not on file  Occupational History  . Not on file  Tobacco Use  . Smoking status: Never Smoker  . Smokeless tobacco: Never Used  Vaping Use  . Vaping Use: Never used  Substance and Sexual Activity  . Alcohol use: Never  . Drug use: Never  . Sexual activity: Not on file  Other Topics Concern  . Not on file  Social History Narrative  . Not on file   Social Determinants of Health   Financial Resource Strain: Not on file  Food Insecurity: Not on file  Transportation Needs: Not on file  Physical Activity: Not on file  Stress: Not on file  Social Connections: Not on file     Vital Signs: BP (!) 148/82   Pulse (!) 105   Temp 98.3 F (36.8 C)   SpO2 99%   Physical Exam Constitutional:      General: She is not in acute distress. Pulmonary:     Effort:  Pulmonary effort is normal.  Abdominal:     Tenderness: There is abdominal tenderness.     Comments: RUQ tenderness. Drain in place with sutures and stat-lock intact. Dried drainage and mild erythema around the site. Drain easily flushed.   Skin:    General: Skin is warm and dry.  Neurological:     Mental Status:  She is alert and oriented to person, place, and time.     Imaging: IR Radiologist Eval & Mgmt  Result Date: 06/20/2020 Please refer to notes tab for details about interventional procedure. (Op Note)   Labs:  CBC: Recent Labs    05/28/20 0522 05/29/20 0924 05/30/20 0818 06/13/20 1153  WBC 6.1 4.9 7.9 7.0  HGB 9.9* 10.3* 9.6* 10.0*  HCT 33.7* 35.4* 32.7* 33.5*  PLT 239 227 218 160    COAGS: Recent Labs    08/05/19 0530 05/20/20 2100  INR 1.2 1.6*  APTT 30 58*    BMP: Recent Labs    08/09/19 0431 08/11/19 0445 11/07/19 1204 02/16/20 0520 05/20/20 1152 05/27/20 0610 05/28/20 0522 05/29/20 0924 05/30/20 0818  NA 138 142 140 138   < > 140 138 141 139  K 3.6 4.0 4.9 3.9   < > 3.9 3.7 4.0 3.9  CL 107 104 108 107   < > 103 103 108 109  CO2 20* 29 20* 17*   < > 25 23 23  21*  GLUCOSE 181* 221* 321* 339*   < > 203* 181* 228* 256*  BUN 15 8 31* 39*   < > 12 13 15  21*  CALCIUM 8.1* 8.2* 8.4* 8.1*   < > 9.1 9.0 8.8* 8.7*  CREATININE 0.51 0.40* 0.57 1.08*   < > 0.66 0.55 0.64 0.65  GFRNONAA >60 >60 >60 58*   < > >60 >60 >60 >60  GFRAA >60 >60 >60 >60  --   --   --   --   --    < > = values in this interval not displayed.    LIVER FUNCTION TESTS: Recent Labs    05/27/20 0610 05/28/20 0522 05/29/20 0924 05/30/20 0818  BILITOT 0.4 0.4 0.2* 0.5  AST 42* 36 44* 36  ALT 30 26 30 25   ALKPHOS 175* 139* 125 139*  PROT 7.3 6.5 6.8 6.2*  ALBUMIN 3.4* 3.2* 3.3* 3.1*    Assessment:  Gallbladder fossa fluid collection s/p drain placement 05/23/20:   Contrast drain injection under fluoroscopy shows a connection to the common bile duct as well as stent  migration. Mrs. Guardiola states she no longer sees the surgeon who performed her cholecystectomy (Dr. Romana Juniper), she only sees Dr. Fuller Plan with GI and her next appointment with him is July 02, 2020. She was instructed to discontinue flushing the drain and just maintain the drain to gravity bag. IR will plan to follow up with her after a plan of action is in place (IR recommends new stent placement) with GI. IR also recommended that she find a new surgeon to weigh in on treatment plans. Depending on GI's plan, patient may need IR follow up for routine drain exchange in approximately 6 weeks. The patient verbalized understanding of these instructions/plans/recommendations. She knows she can call the clinic with any questions or concerns.   Signed: Theresa Duty, NP 06/20/2020, 9:37 AM   Please refer to the attestation of this note by Dr. Pascal Lux for management and plan.

## 2020-06-20 NOTE — Telephone Encounter (Signed)
Notified patient to advise her Dr Fuller Plan is reviewing her care with Dr. Rosalin Hawking will send a message to Dr Windle Guard for his input. Patient voiced understanding.

## 2020-06-20 NOTE — Telephone Encounter (Signed)
Patient is calling regarding pain she is having at drain site and only has one pain  pill left.  She states Dr Lynne Leader office told her to call infectious disease for assistance.   After reviewing the notes from Dr Lynne Leader office the documentation states she should follow up with IR.  Called Dr Lynne Leader office and spoke with Claiborne Billings ,LPN who informed me she had given the patient two numbers to call this morining for interventional radiology and report pain issues.  I spoke with patient and advised her to call the numbers that were given for advise related to the pain at her drain site.   Laverle Patter, RN

## 2020-06-20 NOTE — Telephone Encounter (Signed)
Per Jaclyn Shaggy, please route call to RN to assess. Thank you.

## 2020-06-20 NOTE — Telephone Encounter (Signed)
Inbound call from patient returning your call.

## 2020-06-21 ENCOUNTER — Telehealth: Payer: Self-pay

## 2020-06-21 ENCOUNTER — Telehealth: Payer: Self-pay | Admitting: Gastroenterology

## 2020-06-21 ENCOUNTER — Other Ambulatory Visit: Payer: Self-pay

## 2020-06-21 ENCOUNTER — Other Ambulatory Visit (HOSPITAL_COMMUNITY): Payer: Medicare Other

## 2020-06-21 DIAGNOSIS — K839 Disease of biliary tract, unspecified: Secondary | ICD-10-CM

## 2020-06-21 NOTE — Telephone Encounter (Signed)
I called Kim Hicks to review the results of the IR tube study done yesterday showing a leak between the GB fossa and cystic duct remnant. The biliary stent is in place however it may have migrated slightly distally. She notes 500-600 cc of drainage daily. The stent might be occluded.   I discussed with Dr. Rush Landmark who recommended ERCP with stent removal and placement of 2-3 84f biliary stents. I do not have hospital procedure time next week however GM does and [facility] with me scheduled for 3/3 will be canceled. Kim. SChouinardis in agreement with this plan and I addressed her questions to her satisfaction.

## 2020-06-21 NOTE — Telephone Encounter (Signed)
Patient has been rescheduled for tomorrow and if the weather is bad, she is advised she can go on MOnday

## 2020-06-21 NOTE — Telephone Encounter (Signed)
Left message for patient to call back  

## 2020-06-21 NOTE — Telephone Encounter (Signed)
Cancel appt with PG on 2/8 and reschedule with PG or me for early March. Reschedule ERCP with stent change for early June.

## 2020-06-21 NOTE — Telephone Encounter (Signed)
Pt is requesting a call back from a nurse to reschedule her covid test

## 2020-06-21 NOTE — Telephone Encounter (Signed)
-----   Message from [name], MD sent at 06/20/2020  6:04 PM EST ----- This patient has a persistent bile leak and needs ERCP with stent replacement soon I discussed mgmt with GM this evening. He is willing to do this ERCP at [facility] or [facility] on 2/2 or 2/4 during his yellow hospital week unless I have an earlier hospital opening, which I doubt. Please see if you can book an ERCP slot for her with GM. I need to call the patient tomorrow to review this plan with her so please wait to contact her until after I have spoken with her. Thx.

## 2020-06-21 NOTE — Telephone Encounter (Signed)
Patient rescheduled with [name] RNP for 3/15.  She is aware we will set up the ERCP for June.  I will contact her in APril

## 2020-06-21 NOTE — Telephone Encounter (Signed)
Patient has been scheduled and is aware of the ERCP scheduled for 06/28/20 at 8:30 with Dr. Rush Landmark at [facility].  She is aware to arrive at 7:00 and be NPO after midnight.  She will go for her COVID screen today.    Dr. Fuller Plan.  She is scheduled for follow up with [name] RNP on 2/8 and repeat ERCP with you in March 3.  Does she need to keep both of these appointments?  I believe the 3/3 ERCP was for stent pull.

## 2020-06-21 NOTE — Addendum Note (Signed)
Addended by: Marlon Pel on: 06/21/2020 11:21 AM   Modules accepted: Orders

## 2020-06-22 ENCOUNTER — Other Ambulatory Visit (HOSPITAL_COMMUNITY)
Admission: RE | Admit: 2020-06-22 | Discharge: 2020-06-22 | Disposition: A | Discharge status: Home / Self Care | Payer: Medicare Other | Source: Ambulatory Visit | Attending: Gastroenterology | Admitting: Gastroenterology

## 2020-06-22 DIAGNOSIS — Z01812 Encounter for preprocedural laboratory examination: Secondary | ICD-10-CM | POA: Diagnosis present

## 2020-06-22 DIAGNOSIS — Z20822 Contact with and (suspected) exposure to covid-19: Secondary | ICD-10-CM | POA: Diagnosis not present

## 2020-06-22 LAB — SARS CORONAVIRUS 2 (TAT 6-24 HRS): SARS Coronavirus 2: NEGATIVE

## 2020-06-24 ENCOUNTER — Other Ambulatory Visit: Payer: Self-pay

## 2020-06-24 NOTE — Telephone Encounter (Signed)
Thanks for update. Her Covid test was negative. GM

## 2020-06-26 ENCOUNTER — Encounter (HOSPITAL_COMMUNITY)
Admission: RE | Disposition: A | Discharge status: Home / Self Care | Payer: Self-pay | Source: Home / Self Care | Attending: Gastroenterology

## 2020-06-26 ENCOUNTER — Encounter (HOSPITAL_COMMUNITY): Payer: Self-pay | Admitting: Gastroenterology

## 2020-06-26 ENCOUNTER — Ambulatory Visit (HOSPITAL_COMMUNITY): Payer: Medicare Other | Admitting: Certified Registered Nurse Anesthetist

## 2020-06-26 ENCOUNTER — Ambulatory Visit (HOSPITAL_COMMUNITY): Payer: Medicare Other

## 2020-06-26 ENCOUNTER — Other Ambulatory Visit: Payer: Self-pay

## 2020-06-26 ENCOUNTER — Ambulatory Visit (HOSPITAL_BASED_OUTPATIENT_CLINIC_OR_DEPARTMENT_OTHER)
Admission: RE | Admit: 2020-06-26 | Discharge: 2020-06-26 | Disposition: A | Discharge status: Home / Self Care | Payer: Medicare Other | Source: Home / Self Care | Attending: Gastroenterology | Admitting: Gastroenterology

## 2020-06-26 DIAGNOSIS — K859 Acute pancreatitis without necrosis or infection, unspecified: Secondary | ICD-10-CM | POA: Diagnosis not present

## 2020-06-26 DIAGNOSIS — K2970 Gastritis, unspecified, without bleeding: Secondary | ICD-10-CM | POA: Insufficient documentation

## 2020-06-26 DIAGNOSIS — Y939 Activity, unspecified: Secondary | ICD-10-CM | POA: Insufficient documentation

## 2020-06-26 DIAGNOSIS — Z888 Allergy status to other drugs, medicaments and biological substances status: Secondary | ICD-10-CM | POA: Insufficient documentation

## 2020-06-26 DIAGNOSIS — T85590A Other mechanical complication of bile duct prosthesis, initial encounter: Secondary | ICD-10-CM | POA: Diagnosis not present

## 2020-06-26 DIAGNOSIS — Z833 Family history of diabetes mellitus: Secondary | ICD-10-CM | POA: Insufficient documentation

## 2020-06-26 DIAGNOSIS — Z4659 Encounter for fitting and adjustment of other gastrointestinal appliance and device: Secondary | ICD-10-CM | POA: Diagnosis not present

## 2020-06-26 DIAGNOSIS — T8579XA Infection and inflammatory reaction due to other internal prosthetic devices, implants and grafts, initial encounter: Secondary | ICD-10-CM | POA: Diagnosis not present

## 2020-06-26 DIAGNOSIS — K7460 Unspecified cirrhosis of liver: Secondary | ICD-10-CM | POA: Insufficient documentation

## 2020-06-26 DIAGNOSIS — E1142 Type 2 diabetes mellitus with diabetic polyneuropathy: Secondary | ICD-10-CM | POA: Insufficient documentation

## 2020-06-26 DIAGNOSIS — K839 Disease of biliary tract, unspecified: Secondary | ICD-10-CM | POA: Diagnosis not present

## 2020-06-26 DIAGNOSIS — X58XXXA Exposure to other specified factors, initial encounter: Secondary | ICD-10-CM | POA: Insufficient documentation

## 2020-06-26 DIAGNOSIS — T85898A Other specified complication of other internal prosthetic devices, implants and grafts, initial encounter: Secondary | ICD-10-CM | POA: Insufficient documentation

## 2020-06-26 DIAGNOSIS — Z9049 Acquired absence of other specified parts of digestive tract: Secondary | ICD-10-CM | POA: Insufficient documentation

## 2020-06-26 DIAGNOSIS — Z8249 Family history of ischemic heart disease and other diseases of the circulatory system: Secondary | ICD-10-CM | POA: Insufficient documentation

## 2020-06-26 DIAGNOSIS — K838 Other specified diseases of biliary tract: Secondary | ICD-10-CM | POA: Insufficient documentation

## 2020-06-26 HISTORY — PX: ENDOSCOPIC RETROGRADE CHOLANGIOPANCREATOGRAPHY (ERCP) WITH PROPOFOL: SHX5810

## 2020-06-26 HISTORY — PX: REMOVAL OF STONES: SHX5545

## 2020-06-26 HISTORY — PX: BIOPSY: SHX5522

## 2020-06-26 HISTORY — PX: STENT REMOVAL: SHX6421

## 2020-06-26 HISTORY — PX: ESOPHAGOGASTRODUODENOSCOPY: SHX5428

## 2020-06-26 HISTORY — PX: BILIARY STENT PLACEMENT: SHX5538

## 2020-06-26 LAB — GLUCOSE, CAPILLARY: Glucose-Capillary: 87 mg/dL (ref 70–99)

## 2020-06-26 SURGERY — ENDOSCOPIC RETROGRADE CHOLANGIOPANCREATOGRAPHY (ERCP) WITH PROPOFOL
Anesthesia: General

## 2020-06-26 MED ORDER — ROCURONIUM BROMIDE 10 MG/ML (PF) SYRINGE
PREFILLED_SYRINGE | INTRAVENOUS | Status: DC | PRN
  Administered 2020-06-26: 70 mg via INTRAVENOUS

## 2020-06-26 MED ORDER — ONDANSETRON HCL 4 MG/2ML IJ SOLN
INTRAMUSCULAR | Status: DC | PRN
  Administered 2020-06-26: 4 mg via INTRAVENOUS

## 2020-06-26 MED ORDER — SODIUM CHLORIDE 0.9 % IV SOLN: INTRAVENOUS | Status: DC

## 2020-06-26 MED ORDER — SUGAMMADEX SODIUM 200 MG/2ML IV SOLN
INTRAVENOUS | Status: DC | PRN
  Administered 2020-06-26: 200 mg via INTRAVENOUS

## 2020-06-26 MED ORDER — INDOMETHACIN 50 MG RE SUPP
RECTAL | Status: AC
  Filled 2020-06-26: qty 2

## 2020-06-26 MED ORDER — GLUCAGON HCL RDNA (DIAGNOSTIC) 1 MG IJ SOLR
INTRAMUSCULAR | Status: DC | PRN
  Administered 2020-06-26 (×4): .25 mg via INTRAVENOUS

## 2020-06-26 MED ORDER — LACTATED RINGERS IV SOLN: INTRAVENOUS | Status: DC

## 2020-06-26 MED ORDER — MIDAZOLAM HCL 2 MG/2ML IJ SOLN
INTRAMUSCULAR | Status: AC
  Filled 2020-06-26: qty 2

## 2020-06-26 MED ORDER — PHENYLEPHRINE HCL-NACL 10-0.9 MG/250ML-% IV SOLN
INTRAVENOUS | Status: DC | PRN
  Administered 2020-06-26: 35 ug/min via INTRAVENOUS

## 2020-06-26 MED ORDER — PHENYLEPHRINE 40 MCG/ML (10ML) SYRINGE FOR IV PUSH (FOR BLOOD PRESSURE SUPPORT)
PREFILLED_SYRINGE | INTRAVENOUS | Status: DC | PRN
  Administered 2020-06-26 (×3): 80 ug via INTRAVENOUS

## 2020-06-26 MED ORDER — CIPROFLOXACIN IN D5W 400 MG/200ML IV SOLN
INTRAVENOUS | Status: AC
  Filled 2020-06-26: qty 200

## 2020-06-26 MED ORDER — PROPOFOL 10 MG/ML IV BOLUS
INTRAVENOUS | Status: DC | PRN
  Administered 2020-06-26: 150 mg via INTRAVENOUS

## 2020-06-26 MED ORDER — MIDAZOLAM HCL 5 MG/5ML IJ SOLN
INTRAMUSCULAR | Status: DC | PRN
  Administered 2020-06-26: 2 mg via INTRAVENOUS

## 2020-06-26 MED ORDER — INDOMETHACIN 50 MG RE SUPP
RECTAL | Status: DC | PRN
  Administered 2020-06-26: 100 mg via RECTAL

## 2020-06-26 MED ORDER — FENTANYL CITRATE (PF) 100 MCG/2ML IJ SOLN
25.0000 ug | Freq: Once | INTRAMUSCULAR | Status: AC
  Administered 2020-06-26: 25 ug via INTRAVENOUS

## 2020-06-26 MED ORDER — SODIUM CHLORIDE 0.9 % IV SOLN
INTRAVENOUS | Status: DC | PRN
  Administered 2020-06-26: 30 mL

## 2020-06-26 MED ORDER — EPHEDRINE SULFATE-NACL 50-0.9 MG/10ML-% IV SOSY
PREFILLED_SYRINGE | INTRAVENOUS | Status: DC | PRN
  Administered 2020-06-26: 10 mg via INTRAVENOUS

## 2020-06-26 MED ORDER — PROPOFOL 10 MG/ML IV BOLUS
INTRAVENOUS | Status: AC
  Filled 2020-06-26: qty 20

## 2020-06-26 MED ORDER — FENTANYL CITRATE (PF) 100 MCG/2ML IJ SOLN
INTRAMUSCULAR | Status: DC | PRN
  Administered 2020-06-26 (×2): 25 ug via INTRAVENOUS
  Administered 2020-06-26: 50 ug via INTRAVENOUS

## 2020-06-26 MED ORDER — DEXAMETHASONE SODIUM PHOSPHATE 10 MG/ML IJ SOLN
INTRAMUSCULAR | Status: DC | PRN
  Administered 2020-06-26: 10 mg via INTRAVENOUS

## 2020-06-26 MED ORDER — CIPROFLOXACIN HCL 500 MG PO TABS: 500.0000 mg | ORAL_TABLET | Freq: Two times a day (BID) | ORAL | 0 refills | Status: DC

## 2020-06-26 MED ORDER — CIPROFLOXACIN IN D5W 400 MG/200ML IV SOLN
INTRAVENOUS | Status: DC | PRN
  Administered 2020-06-26: 400 mg via INTRAVENOUS

## 2020-06-26 MED ORDER — GLUCAGON HCL RDNA (DIAGNOSTIC) 1 MG IJ SOLR
INTRAMUSCULAR | Status: AC
  Filled 2020-06-26: qty 2

## 2020-06-26 MED ORDER — SODIUM CHLORIDE 0.9 % IV SOLN
1.5000 g | Freq: Once | INTRAVENOUS | Status: DC
  Filled 2020-06-26: qty 4

## 2020-06-26 MED ORDER — PANTOPRAZOLE SODIUM 40 MG PO TBEC: 40.0000 mg | DELAYED_RELEASE_TABLET | Freq: Every day | ORAL | 6 refills | Status: DC

## 2020-06-26 MED ORDER — FENTANYL CITRATE (PF) 100 MCG/2ML IJ SOLN
INTRAMUSCULAR | Status: AC
  Filled 2020-06-26: qty 2

## 2020-06-26 MED ORDER — LIDOCAINE 2% (20 MG/ML) 5 ML SYRINGE
INTRAMUSCULAR | Status: DC | PRN
  Administered 2020-06-26: 60 mg via INTRAVENOUS

## 2020-06-26 NOTE — Anesthesia Postprocedure Evaluation (Signed)
Anesthesia Post Note  Patient: SABRIAH HOBBINS  Procedure(s) Performed: ENDOSCOPIC RETROGRADE CHOLANGIOPANCREATOGRAPHY (ERCP) WITH PROPOFOL (N/A ) ESOPHAGOGASTRODUODENOSCOPY (EGD) (N/A ) BIOPSY STENT REMOVAL REMOVAL OF STONES BILIARY STENT PLACEMENT (N/A )     Patient location during evaluation: PACU Anesthesia Type: General Level of consciousness: awake and alert Pain management: pain level controlled Vital Signs Assessment: post-procedure vital signs reviewed and stable Respiratory status: spontaneous breathing, nonlabored ventilation and respiratory function stable Cardiovascular status: blood pressure returned to baseline and stable Postop Assessment: no apparent nausea or vomiting Anesthetic complications: no   No complications documented.  Last Vitals:  Vitals:   06/26/20 1120 06/26/20 1130  BP: 116/62 (!) 112/53  Pulse: (!) 102 (!) 102  Resp: 11 12  Temp:    SpO2: 99% 93%    Last Pain:  Vitals:   06/26/20 1130  TempSrc:   PainSc: [place]

## 2020-06-26 NOTE — Op Note (Addendum)
[facility] Patient Name: Kim Hicks Procedure Date: 06/26/2020 MRN: [id number] Attending MD: Justice Britain , MD Date of Birth: 1965-06-26 CSN: [id number] Age: 55 Admit Type: Outpatient Procedure:                ERCP Indications:              Refractory Bile leak (history of plastic stenting,                            FCSEMS which migrated, repeat plastic stenting),                            Stent change Providers:                Justice Britain, MD, Josie Dixon, RN, Elspeth Cho Tech., Technician, Virgia Land, CRNA Referring MD:             Pricilla Riffle. Fuller Plan, MD, [address], [place].                            Kathlene Cote Medicines:                General Anesthesia, Cipro 400 mg IV, Indomethacin                            100 mg PR, Glucagon 1 mg IV Complications:            No immediate complications. Estimated Blood Loss:     Estimated blood loss was minimal. Procedure:                Pre-Anesthesia Assessment:                           - Prior to the procedure, a History and Physical                            was performed, and patient medications and                            allergies were reviewed. The patient's tolerance of                            previous anesthesia was also reviewed. The risks                            and benefits of the procedure and the sedation                            options and risks were discussed with the patient.                            All questions were answered, and informed consent  was obtained. Prior Anticoagulants: The patient has                            taken no previous anticoagulant or antiplatelet                            agents except for NSAID medication. ASA Grade                            Assessment: III - A patient with severe systemic                            disease. After reviewing the risks and benefits,                             the patient was deemed in satisfactory condition to                            undergo the procedure.                           After obtaining informed consent, the scope was                            passed under direct vision. Throughout the                            procedure, the patient's blood pressure, pulse, and                            oxygen saturations were monitored continuously. The                            [id number]-Q180V ([id number]) Olympus Duodenoscope was                            introduced through the mouth, and used to inject                            contrast into and used to inject contrast into the                            bile duct. The ERCP was accomplished without                            difficulty. The patient tolerated the procedure. Scope In: Scope Out: Findings:      A scout film of the abdomen was obtained. Surgical clips, consistent       with previous cholecystectomy, were seen in the area of the right upper       quadrant of the abdomen. One percutaneous drain ending in the Right       upper quadrant was seen. One stent ending in the main bile duct was seen.      A standard esophagogastroduodenoscopy scope was used for  the examination       of the upper gastrointestinal tract. The scope was passed under direct       vision through the upper GI tract. No gross lesions were noted in the       entire esophagus. The Z-line was regular and was found 40 cm from the       incisors. Patchy moderate inflammation characterized by erosions,       erythema, friability and granularity was found in the entire examined       stomach. No other gross lesions were noted in the entire examined       stomach. Biopsies were taken on the greater curvature of the stomach, on       the lesser curvature of the stomach, at the incisura and in the gastric       antrum through the esophagogastroduodenoscope with the cold forceps for       histology.No gross mucosal  lesions were noted in the duodenal bulb, in       the first portion of the duodenum and in the second portion of the       duodenum. A biliary sphincterotomy had been performed. The       sphincterotomy appeared open. One plastic biliary stent originating in       the biliary tree was emerging from the major papilla. The stent was       partially occluded and had migrated significantly into the duodenum.       This stent was removed from the biliary tree using a snare.      A short 0.035 inch Soft Jagwire was passed into the biliary tree. The       Hydratome sphincterotome was passed over the guidewire and the bile duct       was then deeply cannulated. Contrast was injected. I personally       interpreted the bile duct images. Ductal flow of contrast was adequate.       Image quality was adequate. Contrast extended to the cystic duct and       outwards into the RUQ region near drain. Opacification was successful.       The main bile duct was moderately dilated. The largest diameter was 13       mm. Extravasation of contrast originating from the cystic duct into the       RUQ region was observed. To discover objects, the biliary tree was swept       with a retrieval balloon starting at the bifurcation. Sludge was swept       from the duct. An occlusion cholangiogram was performed that showed       extravasation of contrast originating from the cystic duct. Four plastic       biliary stents (2 - 10 Fr by 5 cm, 1 - 10 Fr by 7 cm, 1 - 7 Fr by 5 cm)       with single external flaps and a single internal flaps were placed into       the common bile duct. Bile flowed through the stents. The stents were in       good position.      A pancreatogram was not performed.      The duodenoscope was withdrawn from the patient. Impression:               - No gross lesions in esophagus. Z-line regular, 40  cm from the incisors.                           - Gastritis. No other gross  lesions in the stomach.                            Biopsied.                           - No gross lesions in the duodenal bulb, in the                            first portion of the duodenum and in the second                            portion of the duodenum.                           - Prior biliary sphincterotomy appeared open.                           - One partially occluded migrated stent from the                            biliary tree was seen in the major papilla. This                            was removed.                           - The entire main bile duct was moderately dilated.                           - The biliary tree was swept and sludge was found.                           - Biliary extravasation was noted from cystic duct                            into the percutaneous drain.                           - Four plastic biliary stents were placed into the                            common bile duct. Moderate Sedation:      Not Applicable - Patient had care per Anesthesia. Recommendation:           - The patient will be observed post-procedure,                            until all discharge criteria are met.                           - Discharge patient to home.                           -  Patient has a contact number available for                            emergencies. The signs and symptoms of potential                            delayed complications were discussed with the                            patient. Return to normal activities tomorrow.                            Written discharge instructions were provided to the                            patient.                           - Low fat diet for 1 week.                           - Ciprofloxacin 500 mg twice daily for 3-days for                            infectious prophylaxis.                           - Observe patient's clinical course.                           - Watch for pancreatitis, bleeding,  perforation,                            and cholangitis.                           - Monitor output from drain over the course of the                            coming weeks.                           - Await path results.                           - Repeat ERCP in 4 months to exchange stent.                           - If issues arise with continuation of biliary                            leak, then can consider 1 more ERCP with FCSEMS and                            subsequent plastic internal stenting in effort of  trying to keep stent from migrating. Otherwise,                            surgical management will be required at that time                            point.                           - The findings and recommendations were discussed                            with the patient.                           - The findings and recommendations were discussed                            with the patient's family. Procedure Code(s):        --- Professional ---                           [phone], Endoscopic retrograde                            cholangiopancreatography (ERCP); with removal and                            exchange of stent(s), biliary or pancreatic duct,                            including pre- and post-dilation and guide wire                            passage, when performed, including sphincterotomy,                            when performed, each stent exchanged                           43276, 56, Endoscopic retrograde                            cholangiopancreatography (ERCP); with removal and                            exchange of stent(s), biliary or pancreatic duct,                            including pre- and post-dilation and guide wire                            passage, when performed, including sphincterotomy,                            when performed, each stent exchanged  43276, 59, Endoscopic  retrograde                            cholangiopancreatography (ERCP); with removal and                            exchange of stent(s), biliary or pancreatic duct,                            including pre- and post-dilation and guide wire                            passage, when performed, including sphincterotomy,                            when performed, each stent exchanged                           43276, 68, Endoscopic retrograde                            cholangiopancreatography (ERCP); with removal and                            exchange of stent(s), biliary or pancreatic duct,                            including pre- and post-dilation and guide wire                            passage, when performed, including sphincterotomy,                            when performed, each stent exchanged                           43264, Endoscopic retrograde                            cholangiopancreatography (ERCP); with removal of                            calculi/debris from biliary/pancreatic duct(s)                           43239, Esophagogastroduodenoscopy, flexible,                            transoral; with biopsy, single or multiple Diagnosis Code(s):        --- Professional ---                           K29.70, Gastritis, unspecified, without bleeding                           T85.590A, Other mechanical complication of bile  duct prosthesis, initial encounter                           K83.8, Other specified diseases of biliary tract                           K83.9, Disease of biliary tract, unspecified                           Z46.59, Encounter for fitting and adjustment of                            other gastrointestinal appliance and device CPT copyright 2019 [employer]. All rights reserved. The codes documented in this report are preliminary and upon coder review may  be revised to meet current compliance requirements. Justice Britain, MD 06/26/2020 10:50:08 AM Number of Addenda: 0

## 2020-06-26 NOTE — Transfer of Care (Signed)
Immediate Anesthesia Transfer of Care Note  Patient: Kim Hicks  Procedure(s) Performed: ENDOSCOPIC RETROGRADE CHOLANGIOPANCREATOGRAPHY (ERCP) WITH PROPOFOL (N/A )  Patient Location: PACU and Endoscopy Unit  Anesthesia Type:General  Level of Consciousness: awake, alert  and oriented  Airway & Oxygen Therapy: Patient Spontanous Breathing and Patient connected to face mask oxygen  Post-op Assessment: Report given to RN and Post -op Vital signs reviewed and stable  Post vital signs: Reviewed and stable  Last Vitals:  Vitals Value Taken Time  BP    Temp    Pulse 101 06/26/20 1042  Resp 12 06/26/20 1042  SpO2 100 % 06/26/20 1042  Vitals shown include unvalidated device data.  Last Pain:  Vitals:   06/26/20 0807  TempSrc: Oral  PainSc: 8       Patients Stated Pain Goal: 8 (53/31/74 0992)  Complications: No complications documented.

## 2020-06-26 NOTE — Anesthesia Procedure Notes (Signed)
Procedure Name: Intubation Date/Time: 06/26/2020 8:39 AM Performed by: Maxwell Caul, CRNA Pre-anesthesia Checklist: Patient identified, Emergency Drugs available, Suction available and Patient being monitored Patient Re-evaluated:Patient Re-evaluated prior to induction Oxygen Delivery Method: Circle system utilized Preoxygenation: Pre-oxygenation with 100% oxygen Induction Type: IV induction Ventilation: Mask ventilation without difficulty Laryngoscope Size: Mac and 4 Grade View: Grade I Tube type: Oral Tube size: 7.0 mm Number of attempts: 1 Airway Equipment and Method: Stylet Placement Confirmation: ETT inserted through vocal cords under direct vision,  positive ETCO2 and breath sounds checked- equal and bilateral Secured at: 21 cm Tube secured with: Tape Dental Injury: Teeth and Oropharynx as per pre-operative assessment

## 2020-06-26 NOTE — Anesthesia Preprocedure Evaluation (Signed)
Anesthesia Evaluation  Patient identified by MRN, date of birth, ID band Patient awake    Reviewed: Allergy & Precautions, NPO status , Patient's Chart, lab work & pertinent test results  Airway Mallampati: I       Dental  (+) Teeth Intact   Pulmonary neg pulmonary ROS,    Pulmonary exam normal        Cardiovascular hypertension, Pt. on medications Normal cardiovascular exam     Neuro/Psych  Headaches, PSYCHIATRIC DISORDERS Anxiety Depression    GI/Hepatic GERD  ,(+) Cirrhosis  ([facility])      , Persistent bile leak, s/p multiple ERCPs/stents   Endo/Other  negative endocrine ROSdiabetes, Insulin Dependent, Oral Hypoglycemic Agents  Renal/GU negative Renal ROS  negative genitourinary   Musculoskeletal negative musculoskeletal ROS (+)   Abdominal Normal abdominal exam  (+)   Peds  Hematology  (+) Blood dyscrasia (Hgb 10.8), anemia ,   Anesthesia Other Findings Persistent bile leak s/p cholecystectomy March 2021  Reproductive/Obstetrics                             Anesthesia Physical  Anesthesia Plan  ASA: III  Anesthesia Plan: General   Post-op Pain Management:    Induction: Intravenous  PONV Risk Score and Plan: 3 and Midazolam, Dexamethasone, Ondansetron and Treatment may vary due to age or medical condition  Airway Management Planned: Oral ETT  Additional Equipment:   Intra-op Plan:   Post-operative Plan: Extubation in OR  Informed Consent: I have reviewed the patients History and Physical, chart, labs and discussed the procedure including the risks, benefits and alternatives for the proposed anesthesia with the patient or authorized representative who has indicated his/her understanding and acceptance.     Dental advisory given  Plan Discussed with:   Anesthesia Plan Comments:         Anesthesia Quick Evaluation

## 2020-06-26 NOTE — Discharge Instructions (Signed)

## 2020-06-26 NOTE — Progress Notes (Signed)
Pt voided in commode prior to discharge following ERCP today. Pt reported bright red blood in the commode. Denies new or worsening abdominal pain. States has not experienced vaginal bleeding of any kind for a very long time.  Ambulated back to stretcher, assessed perineal area with Hunt Oris. Scant amount of blood, some dried, wiped with washcloth. No signs of external abrasion, no active bleeding noted.  Called Dr Rush Landmark to inform, he recommended patient contact PCP to follow up/evaluate. PCP may consider transvaginal ultrasound or other diagnostic measures. Relayed info to patient, she will call PCP office today.

## 2020-06-26 NOTE — H&P (Signed)
GASTROENTEROLOGY PROCEDURE H&P NOTE   Primary Care Physician: [name], FNP  HPI: Kim Hicks is a 55 y.o. female who presents for ERCP for persistent biliary leak.  Failed plastic stent then metal biliary stent migration and then again plastic stenting.    Past Medical History:  Diagnosis Date  . Anxiety   . Cataract   . Cirrhosis of liver ([place])   . Depression   . Diabetes mellitus without complication ([place])   . Dysrhythmia   . Edema   . Family history of adverse reaction to anesthesia    mother had trouble waking up after surgery  . Foot fracture, left   . Headache    MIGRAINES  . Hyperlipidemia   . Hypertension   . Neuromuscular disorder ([place])    NEUROPATHY-both hands and feet  . Palpitations   . Wheezing    Past Surgical History:  Procedure Laterality Date  . ABDOMINAL HYSTERECTOMY     one ovary left  . BILIARY STENT PLACEMENT N/A 08/07/2019   Procedure: BILIARY STENT PLACEMENT;  Surgeon: [name], MD;  Location: [facility];  Service: Endoscopy;  Laterality: N/A;  . BILIARY STENT PLACEMENT N/A 11/07/2019   Procedure: BILIARY STENT PLACEMENT;  Surgeon: [name], MD;  Location: [facility];  Service: Endoscopy;  Laterality: N/A;  . BILIARY STENT PLACEMENT N/A 05/13/2020   Procedure: BILIARY STENT PLACEMENT;  Surgeon: [name], MD;  Location: [facility];  Service: Endoscopy;  Laterality: N/A;  . BILIARY STENT PLACEMENT N/A 05/27/2020   Procedure: BILIARY STENT PLACEMENT;  Surgeon: [name], MD;  Location: [facility];  Service: Endoscopy;  Laterality: N/A;  . CATARACT EXTRACTION W/PHACO Right 01/11/2018   Procedure: CATARACT EXTRACTION PHACO AND INTRAOCULAR LENS PLACEMENT (IOC);  Surgeon: [name], MD;  Location: [facility];  Service: Ophthalmology;  Laterality: Right;  [place] 00:57.5 AP% 15.0 CDE 8.61 Fluid Pack Lot # [id number]  . CATARACT EXTRACTION W/PHACO Left 02/15/2018   Procedure: CATARACT EXTRACTION PHACO AND  INTRAOCULAR LENS PLACEMENT (IOC);  Surgeon: [name], MD;  Location: [facility];  Service: Ophthalmology;  Laterality: Left;  [place] 00:51 AP% 13.2 CDE 6.82 Fluid p ack lot # [id number]  . CHOLECYSTECTOMY N/A 08/04/2019   Procedure: LAPAROSCOPIC CHOLECYSTECTOMY;  Surgeon: [name], MD;  Location: [facility];  Service: General;  Laterality: N/A;  . COLONOSCOPY    . ENDOSCOPIC RETROGRADE CHOLANGIOPANCREATOGRAPHY (ERCP) WITH PROPOFOL N/A 11/07/2019   Procedure: ENDOSCOPIC RETROGRADE CHOLANGIOPANCREATOGRAPHY (ERCP) WITH PROPOFOL;  Surgeon: [name], MD;  Location: [facility];  Service: Endoscopy;  Laterality: N/A;  . ENDOSCOPIC RETROGRADE CHOLANGIOPANCREATOGRAPHY (ERCP) WITH PROPOFOL N/A 05/13/2020   Procedure: ENDOSCOPIC RETROGRADE CHOLANGIOPANCREATOGRAPHY (ERCP) WITH PROPOFOL;  Surgeon: [name], MD;  Location: [facility];  Service: Endoscopy;  Laterality: N/A;  . ERCP N/A 08/07/2019   Procedure: ENDOSCOPIC RETROGRADE CHOLANGIOPANCREATOGRAPHY (ERCP);  Surgeon: [name], MD;  Location: [facility];  Service: Endoscopy;  Laterality: N/A;  . ERCP N/A 05/27/2020   Procedure: ENDOSCOPIC RETROGRADE CHOLANGIOPANCREATOGRAPHY (ERCP);  Surgeon: [name], MD;  Location: [facility];  Service: Endoscopy;  Laterality: N/A;  . IR RADIOLOGIST EVAL & MGMT  06/20/2020  . RCR Bilateral   . REMOVAL OF STONES  08/07/2019   Procedure: REMOVAL OF STONES;  Surgeon: [name], MD;  Location: [facility];  Service: Endoscopy;;  balloon sweep, no stones  . REMOVAL OF STONES  05/27/2020   Procedure: REMOVAL OF STONES;  Surgeon: [name], MD;  Location:  [facility];  Service: Endoscopy;;  . SPHINCTEROTOMY  08/07/2019   Procedure: SPHINCTEROTOMY;  Surgeon: [name], MD;  Location: [facility];  Service: Endoscopy;;  . STENT REMOVAL  11/07/2019   Procedure: STENT REMOVAL;  Surgeon: [name], MD;  Location: [facility];  Service: Endoscopy;;  . STENT  REMOVAL  05/13/2020   Procedure: STENT REMOVAL;  Surgeon: [name], MD;  Location: [facility];  Service: Endoscopy;;  . STENT REMOVAL  05/27/2020   Procedure: STENT REMOVAL;  Surgeon: [name], MD;  Location: [facility];  Service: Endoscopy;;   Current Facility-Administered Medications  Medication Dose Route Frequency Provider Last Rate Last Admin  . ampicillin-sulbactam (UNASYN) 1.5 g in sodium chloride 0.9 % 100 mL IVPB  1.5 g Intravenous Once [name], MD      . lactated ringers infusion   Intravenous Continuous Mansouraty, Telford Nab., MD      . lactated ringers infusion   Intravenous Continuous Mansouraty, Telford Nab., MD       Allergies  Allergen Reactions  . Talwin [Pentazocine]     Turned blue around lips and face, rash all over body   Family History  Problem Relation Age of Onset  . Diabetes Mother   . Heart failure Mother   . Depression Mother   . Colon cancer Neg Hx   . Stomach cancer Neg Hx   . Esophageal cancer Neg Hx   . Pancreatic cancer Neg Hx    Social History   Socioeconomic History  . Marital status: Married    Spouse name: Not on file  . Number of children: Not on file  . Years of education: Not on file  . Highest education level: Not on file  Occupational History  . Not on file  Tobacco Use  . Smoking status: Never Smoker  . Smokeless tobacco: Never Used  Vaping Use  . Vaping Use: Never used  Substance and Sexual Activity  . Alcohol use: Never  . Drug use: Never  . Sexual activity: Not on file  Other Topics Concern  . Not on file  Social History Narrative  . Not on file   Social Determinants of Health   Financial Resource Strain: Not on file  Food Insecurity: Not on file  Transportation Needs: Not on file  Physical Activity: Not on file  Stress: Not on file  Social Connections: Not on file  Intimate Partner Violence: Not on file    Physical Exam: Vital signs in last 24 hours: Temp:  [98.5 F (36.9 C)] 98.5  F (36.9 C) (02/02 0807) Pulse Rate:  [94] 94 (02/02 0807) Resp:  [19] 19 (02/02 0807) BP: (124)/(72) 124/72 (02/02 0807) SpO2:  [99 %] 99 % (02/02 0807) Weight:  [72.6 kg] 72.6 kg (02/02 0807)   GEN: NAD EYE: Sclerae anicteric ENT: MMM CV: Non-tachycardic GI: Soft, NT/ND NEURO:  Alert & Oriented x 3  Lab Results: No results for input(s): WBC, HGB, HCT, PLT in the last 72 hours. BMET No results for input(s): NA, K, CL, CO2, GLUCOSE, BUN, CREATININE, CALCIUM in the last 72 hours. LFT No results for input(s): PROT, ALBUMIN, AST, ALT, ALKPHOS, BILITOT, BILIDIR, IBILI in the last 72 hours. PT/INR No results for input(s): LABPROT, INR in the last 72 hours.   Impression / Plan: This is a 55 y.o.female who presents for ERCP for persistent biliary leak.  Failed plastic stent then metal biliary stent migration and then again plastic stenting.  The risks of  an ERCP were discussed at length, including but not limited to the risk of perforation, bleeding, abdominal pain, post-ERCP pancreatitis (while usually mild can be severe and even life threatening).  The risks and benefits of endoscopic evaluation were discussed with the patient; these include but are not limited to the risk of perforation, infection, bleeding, missed lesions, lack of diagnosis, severe illness requiring hospitalization, as well as anesthesia and sedation related illnesses.  The patient is agreeable to proceed.    Justice Britain, MD No Name Gastroenterology [facility] Endoscopy Office # [phone]

## 2020-06-27 ENCOUNTER — Telehealth: Payer: Self-pay

## 2020-06-27 ENCOUNTER — Emergency Department (HOSPITAL_COMMUNITY): Payer: Medicare Other

## 2020-06-27 ENCOUNTER — Other Ambulatory Visit: Payer: Self-pay

## 2020-06-27 ENCOUNTER — Inpatient Hospital Stay (HOSPITAL_COMMUNITY)
Admission: EM | Admit: 2020-06-27 | Discharge: 2020-07-13 | DRG: 919 | Disposition: A | Discharge status: Home / Self Care | Payer: Medicare Other | Attending: Internal Medicine | Admitting: Internal Medicine

## 2020-06-27 ENCOUNTER — Encounter (HOSPITAL_COMMUNITY): Payer: Self-pay | Admitting: Gastroenterology

## 2020-06-27 DIAGNOSIS — Z9842 Cataract extraction status, left eye: Secondary | ICD-10-CM

## 2020-06-27 DIAGNOSIS — J9601 Acute respiratory failure with hypoxia: Secondary | ICD-10-CM | POA: Diagnosis not present

## 2020-06-27 DIAGNOSIS — T8579XA Infection and inflammatory reaction due to other internal prosthetic devices, implants and grafts, initial encounter: Principal | ICD-10-CM | POA: Diagnosis present

## 2020-06-27 DIAGNOSIS — Z833 Family history of diabetes mellitus: Secondary | ICD-10-CM

## 2020-06-27 DIAGNOSIS — Z888 Allergy status to other drugs, medicaments and biological substances status: Secondary | ICD-10-CM

## 2020-06-27 DIAGNOSIS — R1011 Right upper quadrant pain: Secondary | ICD-10-CM | POA: Diagnosis not present

## 2020-06-27 DIAGNOSIS — T82594A Other mechanical complication of infusion catheter, initial encounter: Secondary | ICD-10-CM | POA: Diagnosis not present

## 2020-06-27 DIAGNOSIS — R4701 Aphasia: Secondary | ICD-10-CM | POA: Diagnosis not present

## 2020-06-27 DIAGNOSIS — E119 Type 2 diabetes mellitus without complications: Secondary | ICD-10-CM

## 2020-06-27 DIAGNOSIS — K219 Gastro-esophageal reflux disease without esophagitis: Secondary | ICD-10-CM | POA: Diagnosis present

## 2020-06-27 DIAGNOSIS — K7469 Other cirrhosis of liver: Secondary | ICD-10-CM | POA: Diagnosis present

## 2020-06-27 DIAGNOSIS — K5909 Other constipation: Secondary | ICD-10-CM | POA: Diagnosis not present

## 2020-06-27 DIAGNOSIS — R6521 Severe sepsis with septic shock: Secondary | ICD-10-CM | POA: Diagnosis not present

## 2020-06-27 DIAGNOSIS — K838 Other specified diseases of biliary tract: Secondary | ICD-10-CM | POA: Diagnosis present

## 2020-06-27 DIAGNOSIS — Y92239 Unspecified place in hospital as the place of occurrence of the external cause: Secondary | ICD-10-CM | POA: Diagnosis not present

## 2020-06-27 DIAGNOSIS — R748 Abnormal levels of other serum enzymes: Secondary | ICD-10-CM | POA: Diagnosis present

## 2020-06-27 DIAGNOSIS — T40605A Adverse effect of unspecified narcotics, initial encounter: Secondary | ICD-10-CM | POA: Diagnosis not present

## 2020-06-27 DIAGNOSIS — T85898A Other specified complication of other internal prosthetic devices, implants and grafts, initial encounter: Secondary | ICD-10-CM | POA: Diagnosis present

## 2020-06-27 DIAGNOSIS — X58XXXA Exposure to other specified factors, initial encounter: Secondary | ICD-10-CM | POA: Diagnosis present

## 2020-06-27 DIAGNOSIS — E1142 Type 2 diabetes mellitus with diabetic polyneuropathy: Secondary | ICD-10-CM | POA: Diagnosis present

## 2020-06-27 DIAGNOSIS — G9341 Metabolic encephalopathy: Secondary | ICD-10-CM

## 2020-06-27 DIAGNOSIS — D638 Anemia in other chronic diseases classified elsewhere: Secondary | ICD-10-CM | POA: Diagnosis present

## 2020-06-27 DIAGNOSIS — Z7984 Long term (current) use of oral hypoglycemic drugs: Secondary | ICD-10-CM

## 2020-06-27 DIAGNOSIS — K2970 Gastritis, unspecified, without bleeding: Secondary | ICD-10-CM | POA: Diagnosis present

## 2020-06-27 DIAGNOSIS — E1165 Type 2 diabetes mellitus with hyperglycemia: Secondary | ICD-10-CM | POA: Diagnosis not present

## 2020-06-27 DIAGNOSIS — Z8249 Family history of ischemic heart disease and other diseases of the circulatory system: Secondary | ICD-10-CM

## 2020-06-27 DIAGNOSIS — K859 Acute pancreatitis without necrosis or infection, unspecified: Secondary | ICD-10-CM | POA: Diagnosis present

## 2020-06-27 DIAGNOSIS — K7581 Nonalcoholic steatohepatitis (NASH): Secondary | ICD-10-CM | POA: Diagnosis present

## 2020-06-27 DIAGNOSIS — K7290 Hepatic failure, unspecified without coma: Secondary | ICD-10-CM | POA: Diagnosis not present

## 2020-06-27 DIAGNOSIS — Z4659 Encounter for fitting and adjustment of other gastrointestinal appliance and device: Secondary | ICD-10-CM

## 2020-06-27 DIAGNOSIS — Z0189 Encounter for other specified special examinations: Secondary | ICD-10-CM

## 2020-06-27 DIAGNOSIS — J690 Pneumonitis due to inhalation of food and vomit: Secondary | ICD-10-CM | POA: Diagnosis not present

## 2020-06-27 DIAGNOSIS — E876 Hypokalemia: Secondary | ICD-10-CM | POA: Diagnosis not present

## 2020-06-27 DIAGNOSIS — Z90710 Acquired absence of both cervix and uterus: Secondary | ICD-10-CM

## 2020-06-27 DIAGNOSIS — E1169 Type 2 diabetes mellitus with other specified complication: Secondary | ICD-10-CM

## 2020-06-27 DIAGNOSIS — Z20822 Contact with and (suspected) exposure to covid-19: Secondary | ICD-10-CM | POA: Diagnosis present

## 2020-06-27 DIAGNOSIS — L8990 Pressure ulcer of unspecified site, unspecified stage: Secondary | ICD-10-CM | POA: Insufficient documentation

## 2020-06-27 DIAGNOSIS — I152 Hypertension secondary to endocrine disorders: Secondary | ICD-10-CM

## 2020-06-27 DIAGNOSIS — Z452 Encounter for adjustment and management of vascular access device: Secondary | ICD-10-CM

## 2020-06-27 DIAGNOSIS — Z781 Physical restraint status: Secondary | ICD-10-CM

## 2020-06-27 DIAGNOSIS — I10 Essential (primary) hypertension: Secondary | ICD-10-CM | POA: Diagnosis present

## 2020-06-27 DIAGNOSIS — Z6829 Body mass index (BMI) 29.0-29.9, adult: Secondary | ICD-10-CM

## 2020-06-27 DIAGNOSIS — K7460 Unspecified cirrhosis of liver: Secondary | ICD-10-CM | POA: Diagnosis present

## 2020-06-27 DIAGNOSIS — G928 Other toxic encephalopathy: Secondary | ICD-10-CM | POA: Diagnosis not present

## 2020-06-27 DIAGNOSIS — E860 Dehydration: Secondary | ICD-10-CM | POA: Diagnosis present

## 2020-06-27 DIAGNOSIS — Z9049 Acquired absence of other specified parts of digestive tract: Secondary | ICD-10-CM

## 2020-06-27 DIAGNOSIS — E1159 Type 2 diabetes mellitus with other circulatory complications: Secondary | ICD-10-CM

## 2020-06-27 DIAGNOSIS — Z79899 Other long term (current) drug therapy: Secondary | ICD-10-CM

## 2020-06-27 DIAGNOSIS — Y838 Other surgical procedures as the cause of abnormal reaction of the patient, or of later complication, without mention of misadventure at the time of the procedure: Secondary | ICD-10-CM | POA: Diagnosis present

## 2020-06-27 DIAGNOSIS — F329 Major depressive disorder, single episode, unspecified: Secondary | ICD-10-CM | POA: Diagnosis present

## 2020-06-27 DIAGNOSIS — K581 Irritable bowel syndrome with constipation: Secondary | ICD-10-CM | POA: Diagnosis present

## 2020-06-27 DIAGNOSIS — L89151 Pressure ulcer of sacral region, stage 1: Secondary | ICD-10-CM | POA: Diagnosis present

## 2020-06-27 DIAGNOSIS — E669 Obesity, unspecified: Secondary | ICD-10-CM | POA: Diagnosis present

## 2020-06-27 DIAGNOSIS — T17908A Unspecified foreign body in respiratory tract, part unspecified causing other injury, initial encounter: Secondary | ICD-10-CM

## 2020-06-27 DIAGNOSIS — E785 Hyperlipidemia, unspecified: Secondary | ICD-10-CM | POA: Diagnosis present

## 2020-06-27 DIAGNOSIS — T473X5A Adverse effect of saline and osmotic laxatives, initial encounter: Secondary | ICD-10-CM | POA: Diagnosis not present

## 2020-06-27 DIAGNOSIS — E872 Acidosis: Secondary | ICD-10-CM | POA: Diagnosis present

## 2020-06-27 DIAGNOSIS — N179 Acute kidney failure, unspecified: Secondary | ICD-10-CM | POA: Diagnosis not present

## 2020-06-27 DIAGNOSIS — Z961 Presence of intraocular lens: Secondary | ICD-10-CM | POA: Diagnosis present

## 2020-06-27 DIAGNOSIS — Z9841 Cataract extraction status, right eye: Secondary | ICD-10-CM

## 2020-06-27 DIAGNOSIS — A419 Sepsis, unspecified organism: Secondary | ICD-10-CM | POA: Diagnosis not present

## 2020-06-27 DIAGNOSIS — F419 Anxiety disorder, unspecified: Secondary | ICD-10-CM | POA: Diagnosis present

## 2020-06-27 DIAGNOSIS — K839 Disease of biliary tract, unspecified: Secondary | ICD-10-CM

## 2020-06-27 DIAGNOSIS — E11649 Type 2 diabetes mellitus with hypoglycemia without coma: Secondary | ICD-10-CM | POA: Diagnosis not present

## 2020-06-27 DIAGNOSIS — K9189 Other postprocedural complications and disorders of digestive system: Secondary | ICD-10-CM | POA: Diagnosis present

## 2020-06-27 DIAGNOSIS — Z794 Long term (current) use of insulin: Secondary | ICD-10-CM

## 2020-06-27 DIAGNOSIS — R0902 Hypoxemia: Secondary | ICD-10-CM

## 2020-06-27 LAB — COMPREHENSIVE METABOLIC PANEL
ALT: 30 U/L (ref 0–44)
AST: 44 U/L — ABNORMAL HIGH (ref 15–41)
Albumin: 3.9 g/dL (ref 3.5–5.0)
Alkaline Phosphatase: 142 U/L — ABNORMAL HIGH (ref 38–126)
Anion gap: 11 (ref 5–15)
BUN: 25 mg/dL — ABNORMAL HIGH (ref 6–20)
CO2: 19 mmol/L — ABNORMAL LOW (ref 22–32)
Calcium: 8.7 mg/dL — ABNORMAL LOW (ref 8.9–10.3)
Chloride: 109 mmol/L (ref 98–111)
Creatinine, Ser: 0.83 mg/dL (ref 0.44–1.00)
GFR, Estimated: >60 mL/min (ref >60)
Glucose, Bld: 356 mg/dL — ABNORMAL HIGH (ref 70–99)
Potassium: 4.2 mmol/L (ref 3.5–5.1)
Sodium: 139 mmol/L (ref 135–145)
Total Bilirubin: 0.4 mg/dL (ref 0.3–1.2)
Total Protein: 7.7 g/dL (ref 6.5–8.1)

## 2020-06-27 LAB — CBC WITH DIFFERENTIAL/PLATELET
Abs Immature Granulocytes: 0.02 10*3/uL (ref 0.00–0.07)
Basophils Absolute: 0 10*3/uL (ref 0.0–0.1)
Basophils Relative: 0 %
Eosinophils Absolute: 0.3 10*3/uL (ref 0.0–0.5)
Eosinophils Relative: 5 %
HCT: 33.5 % — ABNORMAL LOW (ref 36.0–46.0)
Hemoglobin: 9.8 g/dL — ABNORMAL LOW (ref 12.0–15.0)
Immature Granulocytes: 0 %
Lymphocytes Relative: 28 %
Lymphs Abs: 1.7 10*3/uL (ref 0.7–4.0)
MCH: 24.4 pg — ABNORMAL LOW (ref 26.0–34.0)
MCHC: 29.3 g/dL — ABNORMAL LOW (ref 30.0–36.0)
MCV: 83.3 fL (ref 80.0–100.0)
Monocytes Absolute: 0.4 10*3/uL (ref 0.1–1.0)
Monocytes Relative: 6 %
Neutro Abs: 3.7 10*3/uL (ref 1.7–7.7)
Neutrophils Relative %: 61 %
Platelets: 161 10*3/uL (ref 150–400)
RBC: 4.02 MIL/uL (ref 3.87–5.11)
RDW: 15.9 % — ABNORMAL HIGH (ref 11.5–15.5)
WBC: 6.2 10*3/uL (ref 4.0–10.5)
nRBC: 0 % (ref 0.0–0.2)

## 2020-06-27 LAB — URINALYSIS, ROUTINE W REFLEX MICROSCOPIC
Bacteria, UA: NONE SEEN
Bilirubin Urine: NEGATIVE
Glucose, UA: >=500 mg/dL — AB
Hgb urine dipstick: NEGATIVE
Ketones, ur: NEGATIVE mg/dL
Leukocytes,Ua: NEGATIVE
Nitrite: NEGATIVE
Protein, ur: NEGATIVE mg/dL
Specific Gravity, Urine: 1.029 (ref 1.005–1.030)
pH: 6 (ref 5.0–8.0)

## 2020-06-27 LAB — PROTIME-INR
INR: 1 (ref 0.8–1.2)
Prothrombin Time: 12.9 seconds (ref 11.4–15.2)

## 2020-06-27 LAB — HEMOGLOBIN A1C
Hgb A1c MFr Bld: 8.9 % — ABNORMAL HIGH (ref 4.8–5.6)
Mean Plasma Glucose: 208.73 mg/dL

## 2020-06-27 LAB — LIPASE, BLOOD: Lipase: 1155 U/L — ABNORMAL HIGH (ref 11–51)

## 2020-06-27 LAB — SURGICAL PATHOLOGY

## 2020-06-27 LAB — CBG MONITORING, ED: Glucose-Capillary: 145 mg/dL — ABNORMAL HIGH (ref 70–99)

## 2020-06-27 MED ORDER — SODIUM CHLORIDE 0.9 % IV SOLN: INTRAVENOUS | Status: DC

## 2020-06-27 MED ORDER — QUETIAPINE FUMARATE 300 MG PO TABS
800.0000 mg | ORAL_TABLET | Freq: Every day | ORAL | Status: DC
  Administered 2020-06-27 – 2020-07-03 (×7): 800 mg via ORAL
  Filled 2020-06-27: qty 1
  Filled 2020-06-27: qty 2
  Filled 2020-06-27 (×5): qty 1

## 2020-06-27 MED ORDER — HYDROMORPHONE HCL 1 MG/ML IJ SOLN
1.0000 mg | Freq: Once | INTRAMUSCULAR | Status: AC
  Administered 2020-06-27: 1 mg via INTRAVENOUS
  Filled 2020-06-27: qty 1

## 2020-06-27 MED ORDER — SENNA 8.6 MG PO TABS
1.0000 | ORAL_TABLET | Freq: Two times a day (BID) | ORAL | Status: DC
  Administered 2020-06-28 – 2020-07-04 (×13): 8.6 mg via ORAL
  Filled 2020-06-27 (×14): qty 1

## 2020-06-27 MED ORDER — INSULIN GLARGINE 100 UNIT/ML ~~LOC~~ SOLN
15.0000 [IU] | Freq: Every day | SUBCUTANEOUS | Status: DC
  Administered 2020-06-27 – 2020-06-30 (×4): 15 [IU] via SUBCUTANEOUS
  Filled 2020-06-27 (×4): qty 0.15

## 2020-06-27 MED ORDER — ONDANSETRON HCL 4 MG/2ML IJ SOLN
4.0000 mg | Freq: Four times a day (QID) | INTRAMUSCULAR | Status: DC | PRN
  Administered 2020-06-28 – 2020-07-01 (×4): 4 mg via INTRAVENOUS
  Filled 2020-06-27 (×4): qty 2

## 2020-06-27 MED ORDER — ONDANSETRON HCL 4 MG/2ML IJ SOLN
4.0000 mg | Freq: Once | INTRAMUSCULAR | Status: AC
  Administered 2020-06-27: 4 mg via INTRAVENOUS
  Filled 2020-06-27: qty 2

## 2020-06-27 MED ORDER — TOPIRAMATE 100 MG PO TABS
200.0000 mg | ORAL_TABLET | Freq: Two times a day (BID) | ORAL | Status: DC
  Administered 2020-06-27 – 2020-07-04 (×14): 200 mg via ORAL
  Filled 2020-06-27 (×13): qty 2

## 2020-06-27 MED ORDER — ACETAMINOPHEN 325 MG PO TABS
650.0000 mg | ORAL_TABLET | Freq: Four times a day (QID) | ORAL | Status: DC | PRN
  Administered 2020-07-05 – 2020-07-09 (×3): 650 mg via ORAL
  Filled 2020-06-27 (×3): qty 2

## 2020-06-27 MED ORDER — SODIUM CHLORIDE 0.9 % IV BOLUS
1000.0000 mL | Freq: Once | INTRAVENOUS | Status: AC
  Administered 2020-06-27: 1000 mL via INTRAVENOUS

## 2020-06-27 MED ORDER — ACETAMINOPHEN 650 MG RE SUPP
650.0000 mg | Freq: Four times a day (QID) | RECTAL | Status: DC | PRN
  Administered 2020-07-04 – 2020-07-05 (×3): 650 mg via RECTAL
  Filled 2020-06-27 (×3): qty 1

## 2020-06-27 MED ORDER — HYDROMORPHONE HCL 1 MG/ML IJ SOLN
0.5000 mg | INTRAMUSCULAR | Status: DC | PRN
  Administered 2020-06-28 – 2020-07-04 (×18): 0.5 mg via INTRAVENOUS
  Filled 2020-06-27 (×18): qty 0.5

## 2020-06-27 MED ORDER — PREGABALIN 100 MG PO CAPS
100.0000 mg | ORAL_CAPSULE | Freq: Three times a day (TID) | ORAL | Status: DC
  Administered 2020-06-27 – 2020-07-04 (×20): 100 mg via ORAL
  Filled 2020-06-27 (×6): qty 1
  Filled 2020-06-27: qty 2
  Filled 2020-06-27 (×11): qty 1
  Filled 2020-06-27: qty 2

## 2020-06-27 MED ORDER — PANTOPRAZOLE SODIUM 40 MG IV SOLR
40.0000 mg | Freq: Every day | INTRAVENOUS | Status: DC
  Administered 2020-06-27 – 2020-06-29 (×3): 40 mg via INTRAVENOUS
  Filled 2020-06-27 (×3): qty 40

## 2020-06-27 MED ORDER — BREXPIPRAZOLE 2 MG PO TABS
2.0000 mg | ORAL_TABLET | Freq: Every day | ORAL | Status: DC
  Administered 2020-06-27 – 2020-07-04 (×8): 2 mg via ORAL
  Filled 2020-06-27 (×8): qty 1

## 2020-06-27 MED ORDER — IOHEXOL 300 MG/ML  SOLN
100.0000 mL | Freq: Once | INTRAMUSCULAR | Status: AC | PRN
  Administered 2020-06-27: 100 mL via INTRAVENOUS

## 2020-06-27 MED ORDER — SODIUM CHLORIDE 0.9 % IV SOLN
75.0000 mL/h | INTRAVENOUS | Status: AC
  Administered 2020-06-27: 75 mL/h via INTRAVENOUS

## 2020-06-27 MED ORDER — BISACODYL 10 MG RE SUPP: 10.0000 mg | Freq: Every day | RECTAL | Status: DC | PRN

## 2020-06-27 MED ORDER — ZIPRASIDONE HCL 20 MG PO CAPS
20.0000 mg | ORAL_CAPSULE | Freq: Every day | ORAL | Status: DC
  Administered 2020-06-27 – 2020-07-03 (×7): 20 mg via ORAL
  Filled 2020-06-27 (×7): qty 1

## 2020-06-27 MED ORDER — CIPROFLOXACIN IN D5W 400 MG/200ML IV SOLN
400.0000 mg | Freq: Two times a day (BID) | INTRAVENOUS | Status: AC
  Administered 2020-06-27 – 2020-07-02 (×10): 400 mg via INTRAVENOUS
  Filled 2020-06-27 (×10): qty 200

## 2020-06-27 MED ORDER — INSULIN ASPART 100 UNIT/ML ~~LOC~~ SOLN
0.0000 [IU] | SUBCUTANEOUS | Status: DC
  Administered 2020-06-28: 2 [IU] via SUBCUTANEOUS
  Administered 2020-06-28: 1 [IU] via SUBCUTANEOUS
  Administered 2020-06-29: 3 [IU] via SUBCUTANEOUS
  Administered 2020-06-29 – 2020-06-30 (×2): 1 [IU] via SUBCUTANEOUS
  Administered 2020-06-30: 2 [IU] via SUBCUTANEOUS
  Administered 2020-06-30 – 2020-07-01 (×3): 1 [IU] via SUBCUTANEOUS
  Administered 2020-07-01 – 2020-07-02 (×2): 2 [IU] via SUBCUTANEOUS
  Administered 2020-07-03: 5 [IU] via SUBCUTANEOUS
  Administered 2020-07-03 (×3): 1 [IU] via SUBCUTANEOUS
  Administered 2020-07-04: 7 [IU] via SUBCUTANEOUS
  Administered 2020-07-04: 3 [IU] via SUBCUTANEOUS
  Administered 2020-07-04: 2 [IU] via SUBCUTANEOUS
  Administered 2020-07-04: 7 [IU] via SUBCUTANEOUS
  Administered 2020-07-04: 1 [IU] via SUBCUTANEOUS
  Administered 2020-07-04: 9 [IU] via SUBCUTANEOUS
  Administered 2020-07-04: 7 [IU] via SUBCUTANEOUS
  Administered 2020-07-05: 3 [IU] via SUBCUTANEOUS
  Administered 2020-07-05: 5 [IU] via SUBCUTANEOUS
  Filled 2020-06-27: qty 0.09

## 2020-06-27 MED ORDER — ONDANSETRON HCL 4 MG PO TABS: 4.0000 mg | ORAL_TABLET | Freq: Four times a day (QID) | ORAL | Status: DC | PRN

## 2020-06-27 MED ORDER — VORTIOXETINE HBR 20 MG PO TABS
20.0000 mg | ORAL_TABLET | Freq: Every day | ORAL | Status: DC
  Administered 2020-06-27 – 2020-07-03 (×7): 20 mg via ORAL
  Filled 2020-06-27 (×2): qty 4
  Filled 2020-06-27: qty 1
  Filled 2020-06-27: qty 4
  Filled 2020-06-27 (×2): qty 1
  Filled 2020-06-27: qty 4

## 2020-06-27 NOTE — ED Triage Notes (Signed)
patient states she had stents x 4 placed in her bile duct yesterday. Patient states she is have unusual RLQ pain today. Patient state she has had this done before and it did not feel like this.

## 2020-06-27 NOTE — ED Provider Notes (Signed)
[facility] DEPT Provider Note   CSN: [id number] Arrival date & time: 06/27/20  1531     History Chief Complaint  Patient presents with  . Post-op Problem  . Abdominal Pain    Kim Hicks is a 55 y.o. female.  HPI   Patient has a history of cirrhosis, type 2 diabetes, cholecystitis complicated by persistent bile leak status post biliary stent.  Patient was admitted to the hospital on December 27 for persistent pain and was found to have an infected biloma.  Patient was referred to infectious disease and had been seeing a lobe our gastroenterology.  Patient was most recently in the hospital yesterday for an outpatient procedure.  Patient had failed plastic stent then metal biliary plastic stent migration and then again plastic stenting.  Patient was seen by Dr. Rush Landmark yesterday and had an ERCP and recurrent stent placement.  She states last evening she started having severe pain in her lower abdomen and upper back.  She pain in her abdomen is also over.  It is tender.  She is nauseated but has not vomited.  She has not measured any fevers.   patient called the GI doctor and was instructed to come to the ED Past Medical History:  Diagnosis Date  . Anxiety   . Cataract   . Cirrhosis of liver ([place])   . Depression   . Diabetes mellitus without complication ([place])   . Dysrhythmia   . Edema   . Family history of adverse reaction to anesthesia    mother had trouble waking up after surgery  . Foot fracture, left   . Headache    MIGRAINES  . Hyperlipidemia   . Hypertension   . Neuromuscular disorder ([place])    NEUROPATHY-both hands and feet  . Palpitations   . Wheezing     Patient Active Problem List   Diagnosis Date Noted  . Abscess of gallbladder   . Migration of biliary stent   . Malnutrition of moderate degree 05/23/2020  . Abdominal pain 05/21/2020  . Biloma 05/20/2020  . Biliary sludge   . Encounter for removal of biliary stent   .  Diabetic peripheral neuropathy ([place]) 09/14/2019  . Gait disturbance 09/14/2019  . NASH (nonalcoholic steatohepatitis) 08/21/2019  . Bile leak   . Other cirrhosis of liver ([place]) 08/04/2019  . DYSPHAGIA UNSPECIFIED 03/04/2009  . GERD 02/18/2009  . PERSISTENT VOMITING 02/18/2009  . CHEST PAIN 02/18/2009  . NAUSEA AND VOMITING 02/18/2009  . DEHYDRATION 07/19/2007  . GASTROENTERITIS 07/19/2007  . FIBROIDS, UTERUS 05/02/2007  . DEPRESSION 05/02/2007  . MIGRAINE HEADACHE 05/02/2007  . Hypertension associated with diabetes ([place]) 05/02/2007  . HEMORRHOIDS 05/02/2007  . Chronic constipation 05/02/2007  . IBS 05/02/2007  . PSORIASIS 05/02/2007  . PITUITARY NEOPLASM, HX OF 05/02/2007  . LIVER FUNCTION TESTS, ABNORMAL, HX OF 05/02/2007  . Insulin dependent type 2 diabetes mellitus ([place]) 02/04/2007  . Hyperlipidemia associated with type 2 diabetes mellitus ([place]) 02/04/2007  . SINUSITIS, ACUTE 02/04/2007  . ABDOMINAL PAIN, RIGHT UPPER QUADRANT 02/04/2007    Past Surgical History:  Procedure Laterality Date  . ABDOMINAL HYSTERECTOMY     one ovary left  . BILIARY STENT PLACEMENT N/A 08/07/2019   Procedure: BILIARY STENT PLACEMENT;  Surgeon: [name], MD;  Location: [facility];  Service: Endoscopy;  Laterality: N/A;  . BILIARY STENT PLACEMENT N/A 11/07/2019   Procedure: BILIARY STENT PLACEMENT;  Surgeon: [name], MD;  Location: [facility];  Service: Endoscopy;  Laterality: N/A;  . BILIARY STENT PLACEMENT N/A 05/13/2020   Procedure: BILIARY STENT PLACEMENT;  Surgeon: [name], MD;  Location: [facility];  Service: Endoscopy;  Laterality: N/A;  . BILIARY STENT PLACEMENT N/A 05/27/2020   Procedure: BILIARY STENT PLACEMENT;  Surgeon: [name], MD;  Location: [facility];  Service: Endoscopy;  Laterality: N/A;  . BILIARY STENT PLACEMENT N/A 06/26/2020   Procedure: BILIARY STENT PLACEMENT;  Surgeon: [name], MD;  Location: [facility];  Service:  Gastroenterology;  Laterality: N/A;  . BIOPSY  06/26/2020   Procedure: BIOPSY;  Surgeon: [name], MD;  Location: [facility];  Service: Gastroenterology;;  . CATARACT EXTRACTION W/PHACO Right 01/11/2018   Procedure: CATARACT EXTRACTION PHACO AND INTRAOCULAR LENS PLACEMENT ([place]);  Surgeon: [name], MD;  Location: [facility];  Service: Ophthalmology;  Laterality: Right;  [place] 00:57.5 AP% 15.0 CDE 8.61 Fluid Pack Lot # [id number]  . CATARACT EXTRACTION W/PHACO Left 02/15/2018   Procedure: CATARACT EXTRACTION PHACO AND INTRAOCULAR LENS PLACEMENT ([facility]);  Surgeon: [name], MD;  Location: [facility];  Service: Ophthalmology;  Laterality: Left;  [place] 00:51 AP% 13.2 CDE 6.82 Fluid p ack lot # [id number]  . CHOLECYSTECTOMY N/A 08/04/2019   Procedure: LAPAROSCOPIC CHOLECYSTECTOMY;  Surgeon: [name], MD;  Location: [facility];  Service: General;  Laterality: N/A;  . COLONOSCOPY    . ENDOSCOPIC RETROGRADE CHOLANGIOPANCREATOGRAPHY (ERCP) WITH PROPOFOL N/A 11/07/2019   Procedure: ENDOSCOPIC RETROGRADE CHOLANGIOPANCREATOGRAPHY (ERCP) WITH PROPOFOL;  Surgeon: [name], MD;  [facility]: [facility];  Service: Endoscopy;  Laterality: N/A;  . ENDOSCOPIC RETROGRADE CHOLANGIOPANCREATOGRAPHY (ERCP) WITH PROPOFOL N/A 05/13/2020   Procedure: ENDOSCOPIC RETROGRADE CHOLANGIOPANCREATOGRAPHY (ERCP) WITH PROPOFOL;  Surgeon: [name], MD;  [facility];  Service: Endoscopy;  Laterality: N/A;  . ENDOSCOPIC RETROGRADE CHOLANGIOPANCREATOGRAPHY (ERCP) WITH PROPOFOL N/A 06/26/2020   Procedure: ENDOSCOPIC RETROGRADE CHOLANGIOPANCREATOGRAPHY (ERCP) WITH PROPOFOL;  Surgeon: [name], MD;  [facility];  Service: Gastroenterology;  Laterality: N/A;  . ERCP N/A 08/07/2019   Procedure: ENDOSCOPIC RETROGRADE CHOLANGIOPANCREATOGRAPHY (ERCP);  Surgeon: [name], MD;  Location: [facility];  Service: Endoscopy;  Laterality: N/A;  . ERCP N/A 05/27/2020    Procedure: ENDOSCOPIC RETROGRADE CHOLANGIOPANCREATOGRAPHY (ERCP);  Surgeon: [name], MD;  Location: [facility];  Service: Endoscopy;  Laterality: N/A;  . ESOPHAGOGASTRODUODENOSCOPY N/A 06/26/2020   Procedure: ESOPHAGOGASTRODUODENOSCOPY (EGD);  Surgeon: [name]., MD;  Location: [facility];  Service: Gastroenterology;  Laterality: N/A;  . IR RADIOLOGIST EVAL & MGMT  06/20/2020  . RCR Bilateral   . REMOVAL OF STONES  08/07/2019   Procedure: REMOVAL OF STONES;  Surgeon: [name], MD;  [facility]: [facility];  Service: Endoscopy;;  balloon sweep, no stones  . REMOVAL OF STONES  05/27/2020   Procedure: REMOVAL OF STONES;  Surgeon: [name], MD;  [facility]: [facility];  Service: Endoscopy;;  . REMOVAL OF STONES  06/26/2020   Procedure: REMOVAL OF STONES;  Surgeon: [name]., MD;  Location: [facility];  Service: Gastroenterology;;  . [name]  08/07/2019   Procedure: SPHINCTEROTOMY;  Surgeon: [name], MD;  [facility];  Service: Endoscopy;;  . STENT REMOVAL  11/07/2019   Procedure: STENT REMOVAL;  Surgeon: [name], MD;  [facility];  Service: Endoscopy;;  . STENT REMOVAL  05/13/2020   Procedure: STENT REMOVAL;  Surgeon: [name], MD;  [facility];  Service: Endoscopy;;  . STENT REMOVAL  05/27/2020   Procedure: STENT REMOVAL;  Surgeon: [name], MD;  Location: [facility];  Service: Endoscopy;;  . Lavell Islam REMOVAL  06/26/2020   Procedure: STENT REMOVAL;  Surgeon: [name]., MD;  Location: [facility];  Service: Gastroenterology;;     OB History   No obstetric history on file.     Family History  Problem Relation Age of Onset  . Diabetes Mother   . Heart failure Mother   . Depression Mother   . Colon cancer Neg Hx   . Stomach cancer Neg Hx   . Esophageal cancer Neg Hx   . Pancreatic cancer Neg Hx     Social History   Tobacco Use  . Smoking status: Never  Smoker  . Smokeless tobacco: Never Used  Vaping Use  . Vaping Use: Never used  Substance Use Topics  . Alcohol use: Never  . Drug use: Never    Home Medications Prior to Admission medications   Medication Sig Start Date End Date Taking? Authorizing Provider  acetaminophen (TYLENOL) 500 MG tablet Take 1,000 mg by mouth every 6 (six) hours as needed for moderate pain.   Yes [provider]  brexpiprazole (REXULTI) 2 MG TABS tablet Take 2 mg by mouth at bedtime.   Yes [provider]  Dulaglutide 4.5 MG/0.5ML SOPN Inject 4.5 mg into the skin every Monday. 12/20/18  Yes [provider]  Empagliflozin-metFORMIN HCl ER (SYNJARDY XR) 25-1000 MG TB24 Take 1 tablet by mouth daily before breakfast.  11/15/18  Yes [provider]  ezetimibe (ZETIA) 10 MG tablet Take 10 mg by mouth daily. 04/25/20  Yes [provider]  Insulin Glargine (BASAGLAR KWIKPEN) 100 UNIT/ML Inject 30 Units into the skin at bedtime. 03/20/20  Yes [provider]  LINZESS 290 MCG CAPS capsule Take 290 mcg by mouth every morning. 08/16/19  Yes [provider]  meloxicam (MOBIC) 15 MG tablet Take 15 mg by mouth daily.   Yes [provider]  metFORMIN (GLUCOPHAGE) 1000 MG tablet Take 1,000 mg by mouth at bedtime. 02/27/20  Yes [provider]  Nutritional Supplements (NUTRITIONAL SHAKE HIGH PROTEIN PO) Take 237 mLs by mouth 2 (two) times daily.   Yes [provider]  ondansetron (ZOFRAN-ODT) 4 MG disintegrating tablet Take 4 mg by mouth every 8 (eight) hours as needed for nausea or vomiting.   Yes [provider]  prazosin (MINIPRESS) 2 MG capsule Take 4 mg by mouth daily.   Yes [provider]  pregabalin (LYRICA) 100 MG capsule TAKE 1 CAPSULE BY MOUTH THREE TIMES A DAY Patient taking differently: Take 100 mg by mouth 3 (three) times daily. 02/12/20  Yes [name], NP  QUEtiapine (SEROQUEL) 400 MG tablet Take 800 mg by mouth  at bedtime.   Yes [provider]  simvastatin (ZOCOR) 10 MG tablet Take 10 mg by mouth daily. 12/13/17  Yes [provider]  topiramate (TOPAMAX) 200 MG tablet Take 200 mg by mouth 2 (two) times daily. 04/12/20  Yes [provider]  vortioxetine HBr ([name]) 20 MG TABS tablet Take 20 mg by mouth at bedtime.   Yes [provider]  ziprasidone ([name]) 20 MG capsule Take 20 mg by mouth at bedtime.   Yes [provider]  amoxicillin-clavulanate (AUGMENTIN) 875-125 MG tablet Take 1 tablet by mouth 2 (two) times daily. Patient taking differently: Take 1 tablet by mouth 2 (two) times daily. Start date : 06/13/20 06/13/20   Jabier Mutton, MD  ciprofloxacin (CIPRO) 500 MG tablet  Take 1 tablet (500 mg total) by mouth 2 (two) times daily for 3 days. 06/26/20 06/29/20  Mansouraty, Telford Nab., MD  lisinopril (ZESTRIL) 20 MG tablet TAKE 1 TABLET BY MOUTH EVERY DAY Patient not taking: No sig reported 02/08/20   [name], MD  pantoprazole (PROTONIX) 40 MG tablet Take 1 tablet (40 mg total) by mouth daily. Patient not taking: No sig reported 06/26/20 06/26/21  Mansouraty, Telford Nab., MD    Allergies    [place] [pentazocine]  Review of Systems   Review of Systems  All other systems reviewed and are negative.   Physical Exam Updated Vital Signs BP 133/67   Pulse 93   Temp 98 F (36.7 C) (Oral)   Resp 14   Ht 1.651 m (5' 5" )   Wt 72.6 kg   SpO2 91%   BMI 26.63 kg/m   Physical Exam Vitals and nursing note reviewed.  Constitutional:      Appearance: She is well-developed and well-nourished. She is ill-appearing.  HENT:     Head: Normocephalic and atraumatic.     Right Ear: External ear normal.     Left Ear: External ear normal.  Eyes:     General: No scleral icterus.       Right eye: No discharge.        Left eye: No discharge.     Conjunctiva/sclera: Conjunctivae normal.  Neck:     Trachea: No tracheal deviation.  Cardiovascular:     Rate  and Rhythm: Normal rate and regular rhythm.     Pulses: Intact distal pulses.  Pulmonary:     Effort: Pulmonary effort is normal. No respiratory distress.     Breath sounds: Normal breath sounds. No stridor. No wheezing or rales.  Abdominal:     General: Bowel sounds are normal. There is no distension.     Palpations: Abdomen is soft.     Tenderness: There is abdominal tenderness in the right upper quadrant, right lower quadrant and epigastric area. There is guarding. There is no rebound.     Hernia: No hernia is present.  Musculoskeletal:        General: No tenderness or edema.     Cervical back: Neck supple.  Skin:    General: Skin is warm and dry.     Findings: No rash.  Neurological:     Mental Status: She is alert.     Cranial Nerves: No cranial nerve deficit (no facial droop, extraocular movements intact, no slurred speech).     Sensory: No sensory deficit.     Motor: No abnormal muscle tone or seizure activity.     Coordination: Coordination normal.     Deep Tendon Reflexes: Strength normal.  Psychiatric:        Mood and Affect: Mood and affect normal.     ED Results / Procedures / Treatments   Labs (all labs ordered are listed, but only abnormal results are displayed) Labs Reviewed  COMPREHENSIVE METABOLIC PANEL - Abnormal; Notable for the following components:      Result Value   CO2 19 (*)    Glucose, Bld 356 (*)    BUN 25 (*)    Calcium 8.7 (*)    AST 44 (*)    Alkaline Phosphatase 142 (*)    All other components within normal limits  LIPASE, BLOOD - Abnormal; Notable for the following components:   Lipase 1,155 (*)    All other components within normal limits  CBC WITH DIFFERENTIAL/PLATELET -  Abnormal; Notable for the following components:   Hemoglobin 9.8 (*)    HCT 33.5 (*)    MCH 24.4 (*)    MCHC 29.3 (*)    RDW 15.9 (*)    All other components within normal limits  URINALYSIS, ROUTINE W REFLEX MICROSCOPIC - Abnormal; Notable for the following  components:   Color, Urine STRAW (*)    Glucose, UA >=500 (*)    All other components within normal limits  SARS CORONAVIRUS 2 (TAT 6-24 HRS)    EKG None  Radiology CT ABDOMEN PELVIS W CONTRAST  Result Date: 06/27/2020 CLINICAL DATA:  Right lower quadrant pain. ERCP with biliary stent exchange yesterday. EXAM: CT ABDOMEN AND PELVIS WITH CONTRAST TECHNIQUE: Multidetector CT imaging of the abdomen and pelvis was performed using the standard protocol following bolus administration of intravenous contrast. CONTRAST:  175m OMNIPAQUE IOHEXOL 300 MG/ML  SOLN COMPARISON:  CT 06/07/2020 FINDINGS: Lower chest: No pleural fluid or focal airspace disease. Hepatobiliary: Percutaneous drainage catheter coiled in the gallbladder fossa. No focal fluid collection in the cholecystectomy bed. There is a punctate focus of air at the porta hepatis which is likely related to recent drain interrogation. There are 4 biliary stents extending from the porta hepatis to the duodenum. Common bile duct is decompressed by biliary stents. There is no intrahepatic biliary ductal dilatation. Cirrhotic hepatic morphology with nodular contours again seen. No intrahepatic fluid collection or focal lesion. Patent portal vein. Pancreas: No peripancreatic fat stranding or evidence pancreatitis post ERCP. No pancreatic ductal dilatation. Spleen: Splenomegaly spanning 14.3 cm cranial caudal, unchanged. No focal splenic abnormality. Adrenals/Urinary Tract: Normal adrenal glands. No hydronephrosis or perinephric edema. Homogeneous renal enhancement with symmetric excretion on delayed phase imaging. Urinary bladder is physiologically distended without wall thickening. Stomach/Bowel: Fluid distended stomach. No gastric wall thickening. No duodenal wall thickening. Proximal small bowel are decompressed. Distal small bowel mildly fluid-filled and prominent. There is fecalization of distal small bowel contents. No terminal ileal inflammation. Normal  appendix. Large colonic stool with transverse colonic tortuosity. No colonic wall thickening or pericolonic edema. There is stool in the rectum. Vascular/Lymphatic: Normal caliber abdominal aorta. Patent portal and splenic veins. No mesenteric gas or thrombosis. No abdominopelvic adenopathy. Reproductive: Hysterectomy. Ovaries not visualized. No adnexal mass. Presumed tubal ligation clips. Other: No ascites or free fluid. Tiny focus of air in the porta hepatis is likely related to recent drain manipulation. No pneumoperitoneum. No focal fluid collection in the abdomen or pelvis. Musculoskeletal: There are no acute or suspicious osseous abnormalities. IMPRESSION: 1. Percutaneous drainage catheter coiled in the gallbladder fossa without focal fluid collection. 2. New 4 biliary stents extending from the porta hepatis to the duodenum. Common bile duct is decompressed by biliary stents. No intrahepatic biliary ductal dilatation. 3. No other acute findings. 4. Cirrhotic hepatic morphology with splenomegaly. 5. Large colonic stool burden with transverse colonic tortuosity, suggesting constipation. Distal small bowel mildly fluid-filled and prominent, with fecalization of distal small bowel contents, suggesting slow transit. Electronically Signed   By: MKeith RakeM.D.   On: 06/27/2020 18:39   DG ERCP BILIARY & PANCREATIC DUCTS  Result Date: 06/26/2020 CLINICAL DATA:  Bile leak and status post percutaneous catheter drainage gallbladder fossa as well as prior endoscopic stenting procedures. EXAM: ERCP TECHNIQUE: Multiple spot images obtained with the fluoroscopic device and submitted for interpretation post-procedure. COMPARISON:  Drainage catheter injection study on 06/20/2020, ERCP on 05/27/2020 and multiple prior imaging studies. FINDINGS: Imaging at the time of ERCP demonstrates removal of  the pre-existing endoscopic plastic biliary stent, cholangiogram demonstrating bile leak at the level of the cystic duct  stump with opacification of the gallbladder fossa and indwelling gallbladder fossa drain and balloon sweep maneuver. Final image shows the presence of 4 parallel plastic endoscopic biliary stents within the common bile duct of different lengths. IMPRESSION: Persistent bile leak at the level of the cystic duct stump. Placement of 4 new parallel plastic endoscopic biliary stents within the common bile duct. These images were submitted for radiologic interpretation only. Please see the procedural report for the amount of contrast and the fluoroscopy time utilized. Electronically Signed   By: Aletta Edouard M.D.   On: 06/26/2020 12:30    Procedures Procedures   Medications Ordered in ED Medications  sodium chloride 0.9 % bolus 1,000 mL (1,000 mLs Intravenous New Bag/Given 06/27/20 1659)    And  0.9 %  sodium chloride infusion ( Intravenous New Bag/Given 06/27/20 1914)  HYDROmorphone (DILAUDID) injection 1 mg (1 mg Intravenous Given 06/27/20 1700)  ondansetron (ZOFRAN) injection 4 mg (4 mg Intravenous Given 06/27/20 1700)  iohexol (OMNIPAQUE) 300 MG/ML solution 100 mL (100 mLs Intravenous Contrast Given 06/27/20 1822)    ED Course  I have reviewed the triage vital signs and the nursing notes.  Pertinent labs & imaging results that were available during my care of the patient were reviewed by me and considered in my medical decision making (see chart for details).  Clinical Course as of 06/27/20 1952  Thu Jun 27, 2020  1924 Urinalysis without signs of infection.  Electrolyte panel shows elevated alk phos at 142 and an AST of 44, slightly increased from previous values.  Glucose also elevated but no signs of anion gap acidosis [JK]  1943 Lipase elevated 1155 [JK]    Clinical Course User Index [JK] Dorie Rank, MD   MDM Rules/Calculators/A&P                         Patient presented to ED with complaints of abdominal pain following a recent biliary stent placement. Patient has had a bile leak  complicating cholecystectomy. Patient had repeat stenting yesterday but started having severe pain today. On exam patient does have tenderness in the upper abdomen. Her laboratory tests are notable for an elevated lipase consistent with acute pancreatitis.  Likely related to her recent stenting.  CT scan does not show any evidence of biliary obstruction or bile leak. Patient has been given IV pain medications. I will consult the medical service for admission and continued pain management.  . Final Clinical Impression(s) / ED Diagnoses Final diagnoses:  Acute pancreatitis, unspecified complication status, unspecified pancreatitis type      Dorie Rank, MD 06/27/20 2005

## 2020-06-27 NOTE — H&P (Signed)
RYE DORADO ION:[id number] DOB: 02-13-1966 DOA: 06/27/2020     PCP: [name], FNP   Outpatient Specialists:     GI Dr. Rush Landmark ( [facility])  Id Dr. Gale Journey  Patient arrived to ER on 06/27/20 at 1531 Referred by Attending Dorie Rank, MD   Patient coming from: home Lives  With family    Chief Complaint:   Chief Complaint  Patient presents with  . Post-op Problem  . Abdominal Pain    HPI: Kim Hicks is a 55 y.o. female with medical history significant of cirrhosis due to [facility], type 2 diabetes, cholecystitis complicated by persistent bile leak status post biliary stent.,  DM2, migraines, HLD, HTN, neuropathy, GERD, IBS    Presented with abdominal pain and low back pain Recently admitted on December 27 for persistent pain and found to have infected biloma.  Infectious disease had seen patient in consult as well as [facility]. Patient has had multiple stenting's in the past today. When seen yesterday by Dr. Rush Landmark yesterday and had an ERCP and recurrent stent placement x4.  Started postoperatively on Cipro  Yesterday developed severe abdominal pain radiating to her back nausea but no vomiting.  No fever. She does report chills  She called GI doctor and was told to come to emergency department    Infectious risk factors:  Reports     Nausea     Has    been vaccinated against COVID no booster   Initial COVID TEST   in house  PCR testing  Pending  Lab Results  Component Value Date   [place] 06/22/2020   [place] NEGATIVE 05/20/2020   [place] NEGATIVE 05/09/2020   [place] NEGATIVE 11/03/2019     Regarding pertinent Chronic problems:     Hyperlipidemia - on   Zetia     Lipid Panel     Component Value Date/Time   CHOL 202 (HH) 08/17/2007 1124   TRIG 284 (HH) 08/17/2007 1124   HDL 27.4 (L) 08/17/2007 1124   CHOLHDL 7.4 CALC 08/17/2007 1124   VLDL 57 (H) 08/17/2007 1124   LDLDIRECT 128.1 08/17/2007 1124     HTN on prazosin, lisinopril     DM 2 -  Lab Results  Component Value Date   HGBA1C 8.2 (H) 05/21/2020   on insulin 30 units at nightt, and Synjardy metfromin       Liver disease MELD-Na score: 6 at 06/27/2020      Chronic anemia - baseline hg Hemoglobin & Hematocrit  Recent Labs    05/30/20 0818 06/13/20 1153 06/27/20 1658  HGB 9.6* 10.0* 9.8*   GERD on PPI  While in ER: Lipase 1155 noted to be mildly elevated alk phos 142 and AST of 44  Treated with Dilaudid and Zofran UA no sign of infection  CT done showing no evidence of biliary obstruction of bile leak Large colonic stool burden with transverse colonic tortuosity, suggesting constipation. Distal small bowel mildly fluid-filled and prominent, with fecalization of distal small bowel contents, suggesting slow transit.  ER Provider sent msg:     Dr. Havery Moros    Hospitalist was called for admission for early post ERCP pancreatitis   The following Work up has been ordered so far:  Orders Placed This Encounter  Procedures  . SARS CORONAVIRUS 2 (TAT 6-24 HRS) Nasopharyngeal Nasopharyngeal Swab  . CT ABDOMEN PELVIS W CONTRAST  . Comprehensive metabolic panel  . Lipase, blood  . CBC with Diff  . Urinalysis, Routine w reflex  microscopic  . Diet NPO time specified  . Initiate Carrier Fluid Protocol  . Consult to hospitalist  ALL PATIENTS BEING ADMITTED/HAVING PROCEDURES NEED COVID-19 SCREENING  . Airborne and Contact precautions  . Insert peripheral IV     Following Medications were ordered in ER: Medications  sodium chloride 0.9 % bolus 1,000 mL (1,000 mLs Intravenous New Bag/Given 06/27/20 1659)    And  0.9 %  sodium chloride infusion ( Intravenous New Bag/Given 06/27/20 1914)  HYDROmorphone (DILAUDID) injection 1 mg (has no administration in time range)  HYDROmorphone (DILAUDID) injection 1 mg (1 mg Intravenous Given 06/27/20 1700)  ondansetron (ZOFRAN) injection 4 mg (4 mg Intravenous Given 06/27/20 1700)  iohexol (OMNIPAQUE) 300 MG/ML solution 100  mL (100 mLs Intravenous Contrast Given 06/27/20 1822)        Consult Orders  (From admission, onward)         Start     Ordered   06/27/20 1951  Consult to hospitalist  ALL PATIENTS BEING ADMITTED/HAVING PROCEDURES NEED COVID-19 SCREENING  Once       Comments: ALL PATIENTS BEING ADMITTED/HAVING PROCEDURES NEED COVID-19 SCREENING  Provider:  (Not yet assigned)  Question Answer Comment  Place call to: [facility]   Reason for Consult Admit      06/27/20 1950          Significant initial  Findings: Abnormal Labs Reviewed  COMPREHENSIVE METABOLIC PANEL - Abnormal; Notable for the following components:      Result Value   CO2 19 (*)    Glucose, Bld 356 (*)    BUN 25 (*)    Calcium 8.7 (*)    AST 44 (*)    Alkaline Phosphatase 142 (*)    All other components within normal limits  LIPASE, BLOOD - Abnormal; Notable for the following components:   Lipase 1,155 (*)    All other components within normal limits  CBC WITH DIFFERENTIAL/PLATELET - Abnormal; Notable for the following components:   Hemoglobin 9.8 (*)    HCT 33.5 (*)    MCH 24.4 (*)    MCHC 29.3 (*)    RDW 15.9 (*)    All other components within normal limits  URINALYSIS, ROUTINE W REFLEX MICROSCOPIC - Abnormal; Notable for the following components:   Color, Urine STRAW (*)    Glucose, UA >=500 (*)    All other components within normal limits     Otherwise labs showing:    Recent Labs  Lab 06/27/20 1658  NA 139  K 4.2  CO2 19*  GLUCOSE 356*  BUN 25*  CREATININE 0.83  CALCIUM 8.7*    Cr   stable,    Lab Results  Component Value Date   CREATININE 0.83 06/27/2020   CREATININE 0.65 05/30/2020   CREATININE 0.64 05/29/2020    Recent Labs  Lab 06/27/20 1658  AST 44*  ALT 30  ALKPHOS 142*  BILITOT 0.4  PROT 7.7  ALBUMIN 3.9   Lab Results  Component Value Date   CALCIUM 8.7 (L) 06/27/2020   PHOS 3.4 08/17/2007      WBC      Component Value Date/Time   WBC 6.2 06/27/2020 1658    LYMPHSABS 1.7 06/27/2020 1658   MONOABS 0.4 06/27/2020 1658   EOSABS 0.3 06/27/2020 1658   BASOSABS 0.0 06/27/2020 1658    Plt: Lab Results  Component Value Date   PLT 161 06/27/2020    Lactic Acid, VenousOrdered    HG/HCT   Stable,  Component Value Date/Time   HGB 9.8 (L) 06/27/2020 1658   HGB 14.7 12/14/2012 1255   HCT 33.5 (L) 06/27/2020 1658   MCV 83.3 06/27/2020 1658    Recent Labs  Lab 06/27/20 1658  LIPASE 1,155*   No results for input(s): AMMONIA in the last 168 hours.     ECG:  Not Ordered    BNP (last 3 results) No results for input(s): BNP in the last 8760 hours.    DM  labs:  HbA1C: Recent Labs    08/01/19 1023 05/21/20 0506  HGBA1C 9.5* 8.2*     CBG (last 3)  Recent Labs    06/26/20 0830 06/27/20 2221  GLUCAP 87 145*       UA   no evidence of UTI     Urine analysis:    Component Value Date/Time   COLORURINE STRAW (A) 06/27/2020 1658   APPEARANCEUR CLEAR 06/27/2020 1658   LABSPEC 1.029 06/27/2020 1658   PHURINE 6.0 06/27/2020 1658   GLUCOSEU >=500 (A) 06/27/2020 1658   HGBUR NEGATIVE 06/27/2020 1658   BILIRUBINUR NEGATIVE 06/27/2020 1658   KETONESUR NEGATIVE 06/27/2020 1658   PROTEINUR NEGATIVE 06/27/2020 1658   NITRITE NEGATIVE 06/27/2020 1658   LEUKOCYTESUR NEGATIVE 06/27/2020 1658      Ordered   CTabd/pelvis - Large colonic stool burden Cirrhotic hepatic morphology with splenomegaly.  New 4 biliary stents extending from the porta hepatis    ED Triage Vitals  Enc Vitals Group     BP 06/27/20 1543 134/81     Pulse Rate 06/27/20 1543 (!) 102     Resp 06/27/20 1543 18     Temp 06/27/20 1543 98.6 F (37 C)     Temp Source 06/27/20 1543 Oral     SpO2 06/27/20 1543 93 %     Weight 06/27/20 1552 160 lb (72.6 kg)     Height 06/27/20 1552 5' 5"  (1.651 m)     Head Circumference --      Peak Flow --      Pain Score 06/27/20 1551 10     Pain Loc --      Pain Edu? --      Excl. in [place]? --   TMAX(24)@       Latest  Blood  pressure 127/76, pulse 89, temperature 98 F (36.7 C), temperature source Oral, resp. rate (!) 25, height 5' 5"  (1.651 m), weight 72.6 kg, SpO2 93 %.   Review of Systems:    Pertinent positives include:  fatigue, chills, abdominal pain, nausea, Constitutional:  No weight loss, night sweats, Fevers,  weight loss  HEENT:  No headaches, Difficulty swallowing,Tooth/dental problems,Sore throat,  No sneezing, itching, ear ache, nasal congestion, post nasal drip,  Cardio-vascular:  No chest pain, Orthopnea, PND, anasarca, dizziness, palpitations.no Bilateral lower extremity swelling  GI:  No heartburn, indigestion,  vomiting, diarrhea, change in bowel habits, loss of appetite, melena, blood in stool, hematemesis Resp:  no shortness of breath at rest. No dyspnea on exertion, No excess mucus, no productive cough, No non-productive cough, No coughing up of blood.No change in color of mucus.No wheezing. Skin:  no rash or lesions. No jaundice GU:  no dysuria, change in color of urine, no urgency or frequency. No straining to urinate.  No flank pain.  Musculoskeletal:  No joint pain or no joint swelling. No decreased range of motion. No back pain.  Psych:  No change in mood or affect. No depression or anxiety. No memory loss.  Neuro: no  localizing neurological complaints, no tingling, no weakness, no double vision, no gait abnormality, no slurred speech, no confusion  All systems reviewed and apart from [place] all are negative  Past Medical History:   Past Medical History:  Diagnosis Date  . Anxiety   . Cataract   . Cirrhosis of liver ([place])   . Depression   . Diabetes mellitus without complication ([place])   . Dysrhythmia   . Edema   . Family history of adverse reaction to anesthesia    mother had trouble waking up after surgery  . Foot fracture, left   . Headache    MIGRAINES  . Hyperlipidemia   . Hypertension   . Neuromuscular disorder ([place])    NEUROPATHY-both hands and feet  .  Palpitations   . Wheezing      Past Surgical History:  Procedure Laterality Date  . ABDOMINAL HYSTERECTOMY     one ovary left  . BILIARY STENT PLACEMENT N/A 08/07/2019   Procedure: BILIARY STENT PLACEMENT;  Surgeon: [name], MD;  Location: [facility];  Service: Endoscopy;  Laterality: N/A;  . BILIARY STENT PLACEMENT N/A 11/07/2019   Procedure: BILIARY STENT PLACEMENT;  Surgeon: [name], MD;  [facility]: [facility];  Service: Endoscopy;  Laterality: N/A;  . BILIARY STENT PLACEMENT N/A 05/13/2020   Procedure: BILIARY STENT PLACEMENT;  Surgeon: [name], MD;  [facility];  Service: Endoscopy;  Laterality: N/A;  . BILIARY STENT PLACEMENT N/A 05/27/2020   Procedure: BILIARY STENT PLACEMENT;  Surgeon: [name], MD;  [facility];  Service: Endoscopy;  Laterality: N/A;  . BILIARY STENT PLACEMENT N/A 06/26/2020   Procedure: BILIARY STENT PLACEMENT;  Surgeon: [name], MD;  [facility]: [facility];  Service: Gastroenterology;  Laterality: N/A;  . BIOPSY  06/26/2020   Procedure: BIOPSY;  Surgeon: [name], MD;  Location: [facility];  Service: Gastroenterology;;  . CATARACT EXTRACTION W/PHACO Right 01/11/2018   Procedure: CATARACT EXTRACTION PHACO AND INTRAOCULAR LENS PLACEMENT ([place]);  Surgeon: [name], MD;  Location: [facility];  Service: Ophthalmology;  Laterality: Right;  [place] 00:57.5 AP% 15.0 CDE 8.61 Fluid Pack Lot # [id number]  . CATARACT EXTRACTION W/PHACO Left 02/15/2018   Procedure: CATARACT EXTRACTION PHACO AND INTRAOCULAR LENS PLACEMENT (IOC);  Surgeon: [name], MD;  Location: [facility];  Service: Ophthalmology;  Laterality: Left;  [place] 00:51 AP% 13.2 CDE 6.82 Fluid p ack lot # [id number]  . CHOLECYSTECTOMY N/A 08/04/2019   Procedure: LAPAROSCOPIC CHOLECYSTECTOMY;  Surgeon: [name], MD;  Location: [facility];  Service: General;  Laterality: N/A;  . COLONOSCOPY    . ENDOSCOPIC RETROGRADE  CHOLANGIOPANCREATOGRAPHY (ERCP) WITH PROPOFOL N/A 11/07/2019   Procedure: ENDOSCOPIC RETROGRADE CHOLANGIOPANCREATOGRAPHY (ERCP) WITH PROPOFOL;  Surgeon: [name], MD;  [facility];  Service: Endoscopy;  Laterality: N/A;  . ENDOSCOPIC RETROGRADE CHOLANGIOPANCREATOGRAPHY (ERCP) WITH PROPOFOL N/A 05/13/2020   Procedure: ENDOSCOPIC RETROGRADE CHOLANGIOPANCREATOGRAPHY (ERCP) WITH PROPOFOL;  Surgeon: [name], MD;  [facility];  Service: Endoscopy;  Laterality: N/A;  . ENDOSCOPIC RETROGRADE CHOLANGIOPANCREATOGRAPHY (ERCP) WITH PROPOFOL N/A 06/26/2020   Procedure: ENDOSCOPIC RETROGRADE CHOLANGIOPANCREATOGRAPHY (ERCP) WITH PROPOFOL;  Surgeon: [name], MD;  [facility];  Service: Gastroenterology;  Laterality: N/A;  . ERCP N/A 08/07/2019   Procedure: ENDOSCOPIC RETROGRADE CHOLANGIOPANCREATOGRAPHY (ERCP);  Surgeon: [name], MD;  Location: [facility];  Service: Endoscopy;  Laterality: N/A;  . ERCP N/A 05/27/2020   Procedure: ENDOSCOPIC RETROGRADE CHOLANGIOPANCREATOGRAPHY (ERCP);  Surgeon: [name], MD;  [facility];  Service: Endoscopy;  Laterality: N/A;  . ESOPHAGOGASTRODUODENOSCOPY N/A 06/26/2020   Procedure: ESOPHAGOGASTRODUODENOSCOPY (EGD);  Surgeon: [name]., MD;  Location: [facility];  Service: Gastroenterology;  Laterality: N/A;  . IR RADIOLOGIST EVAL & MGMT  06/20/2020  . RCR Bilateral   . REMOVAL OF STONES  08/07/2019   Procedure: REMOVAL OF STONES;  Surgeon: [name], MD;  [facility];  Service: Endoscopy;;  balloon sweep, no stones  . REMOVAL OF STONES  05/27/2020   Procedure: REMOVAL OF STONES;  Surgeon: [name], MD;  [facility];  Service: Endoscopy;;  . REMOVAL OF STONES  06/26/2020   Procedure: REMOVAL OF STONES;  Surgeon: [name]., MD;  Location[facility];  Service: Gastroenterology;;  . [name]  08/07/2019   Procedure:  SPHINCTEROTOMY;  Surgeon: [name], MD;  [facility];  Service: Endoscopy;;  . STENT REMOVAL  11/07/2019   Procedure: STENT REMOVAL;  Surgeon: [name], MD;  [facility];  Service: Endoscopy;;  . STENT REMOVAL  05/13/2020   Procedure: STENT REMOVAL;  Surgeon: [name], MD;  [facility];  Service: Endoscopy;;  . STENT REMOVAL  05/27/2020   Procedure: STENT REMOVAL;  Surgeon: [name], MD;  [facility];  Service: Endoscopy;;  . STENT REMOVAL  06/26/2020   Procedure: STENT REMOVAL;  Surgeon: [name]., MD;  [facility];  Service: Gastroenterology;;   Social History:  Ambulatory  Independently      reports that she has never smoked. She has never used smokeless tobacco. She reports that she does not drink alcohol and does not use drugs.   Family History:   Family History  Problem Relation Age of Onset  . Diabetes Mother   . Heart failure Mother   . Depression Mother   . Colon cancer Neg Hx   . Stomach cancer Neg Hx   . Esophageal cancer Neg Hx   . Pancreatic cancer Neg Hx     Allergies: Allergies  Allergen Reactions  . [place] [Pentazocine]     Turned blue around lips and face, rash all over body     Prior to Admission medications   Medication Sig Start Date End Date Taking? Authorizing Provider  acetaminophen (TYLENOL) 500 MG tablet Take 1,000 mg by mouth every 6 (six) hours as needed for moderate pain.   Yes [provider]  brexpiprazole (REXULTI) 2 MG TABS tablet Take 2 mg by mouth at bedtime.   Yes [provider]  Dulaglutide 4.5 MG/0.5ML SOPN Inject 4.5 mg into the skin every Monday. 12/20/18  Yes [provider]  Empagliflozin-metFORMIN HCl ER (SYNJARDY XR) 25-1000 MG TB24 Take 1 tablet by mouth daily before breakfast.  11/15/18  Yes [provider]  ezetimibe (ZETIA) 10 MG tablet Take 10 mg by mouth daily. 04/25/20  Yes [provider]   Insulin Glargine (BASAGLAR KWIKPEN) 100 UNIT/ML Inject 30 Units into the skin at bedtime. 03/20/20  Yes [provider]  LINZESS 290 MCG CAPS capsule Take 290 mcg by mouth every morning. 08/16/19  Yes [provider]  meloxicam (MOBIC) 15 MG tablet Take 15 mg by mouth daily.   Yes [provider]  metFORMIN (GLUCOPHAGE) 1000 MG tablet Take 1,000 mg by mouth at bedtime. 02/27/20  Yes [provider]  Nutritional Supplements (NUTRITIONAL SHAKE HIGH PROTEIN PO) Take 237 mLs by mouth 2 (two) times daily.   Yes [provider]  ondansetron (ZOFRAN-ODT) 4 MG disintegrating tablet Take 4 mg by mouth every 8 (eight) hours as needed for nausea or vomiting.   Yes [provider]  prazosin (MINIPRESS) 2 MG capsule Take 4 mg by mouth daily.   Yes [provider]  pregabalin (LYRICA) 100 MG capsule TAKE 1 CAPSULE BY MOUTH THREE TIMES A DAY Patient taking differently: Take 100 mg by mouth 3 (three) times daily. 02/12/20  Yes [name], NP  QUEtiapine (SEROQUEL) 400 MG tablet Take 800 mg by mouth at bedtime.   Yes [provider]  simvastatin (ZOCOR) 10 MG tablet Take 10 mg by mouth daily. 12/13/17  Yes [provider]  topiramate (TOPAMAX) 200 MG tablet Take 200 mg by mouth 2 (two) times daily. 04/12/20  Yes [provider]  vortioxetine HBr ([name]) 20 MG TABS tablet Take 20 mg by mouth at bedtime.   Yes [provider]  ziprasidone ([name]) 20 MG capsule Take 20 mg by mouth at bedtime.   Yes [provider]  amoxicillin-clavulanate (AUGMENTIN) 875-125 MG tablet Take 1 tablet by mouth 2 (two) times daily. Patient taking differently: Take 1 tablet by mouth 2 (two) times daily. Start date : 06/13/20 06/13/20   Jabier Mutton, MD  ciprofloxacin (CIPRO) 500 MG tablet Take 1 tablet (500 mg total) by mouth 2 (two) times daily for 3 days. 06/26/20 06/29/20  Mansouraty, Telford Nab., MD  lisinopril (ZESTRIL) 20 MG  tablet TAKE 1 TABLET BY MOUTH EVERY DAY Patient not taking: No sig reported 02/08/20   [name], MD  pantoprazole (PROTONIX) 40 MG tablet Take 1 tablet (40 mg total) by mouth daily. Patient not taking: No sig reported 06/26/20 06/26/21  Mansouraty, Telford Nab., MD   Physical Exam: Vitals with BMI 06/27/2020 06/27/2020 06/27/2020  Height - - -  Weight - - -  BMI - - -  Systolic [phone]  Diastolic 76 67 71  Pulse 89 93 94     1. General:  in No Acute distress   Chronically ill  -appearing 2. Psychological: Alert and  Oriented 3. Head/ENT:    Dry Mucous Membranes                          Head Non traumatic, neck supple                           Poor Dentition 4. SKIN:  decreased Skin turgor,  Skin clean Dry and intact no rash 5. Heart: Regular rate and rhythm no Murmur, no Rub or gallop 6. Lungs:  , no wheezes or crackles   7. Abdomen: Soft, diffusely tender,   Distended  obese  bowel sounds absant 8. Lower extremities: no clubbing, cyanosis, no  edema 9. Neurologically Grossly intact, moving all 4 extremities equally  10. MSK: Normal range of motion   All other LABS:     Recent Labs  Lab 06/27/20 1658  WBC 6.2  NEUTROABS 3.7  HGB 9.8*  HCT 33.5*  MCV 83.3  PLT 161     Recent Labs  Lab 06/27/20 1658  NA 139  K 4.2  CL 109  CO2 19*  GLUCOSE 356*  BUN 25*  CREATININE 0.83  CALCIUM 8.7*     Recent Labs  Lab 06/27/20 1658  AST 44*  ALT 30  ALKPHOS 142*  BILITOT 0.4  PROT 7.7  ALBUMIN 3.9  Cultures:    Component Value Date/Time   SDES  05/23/2020 1557    ABSCESS GALLBLADDER FOSSA FLUID Performed at [facility], [place] [address], [place], [place] [address]    [place]  05/23/2020 1557    Normal Performed at [facility], [place] [address] [place], [place] [address]    CULT  05/23/2020 1557    RARE ENTEROCOCCUS FAECALIS NO ANAEROBES ISOLATED Performed at [facility], [place] [address]  [place], [place] [address]    REPTSTATUS 05/28/2020 FINAL 05/23/2020 1557     Radiological Exams on Admission: CT ABDOMEN PELVIS W CONTRAST  Result Date: 06/27/2020 CLINICAL DATA:  Right lower quadrant pain. ERCP with biliary stent exchange yesterday. EXAM: CT ABDOMEN AND PELVIS WITH CONTRAST TECHNIQUE: Multidetector CT imaging of the abdomen and pelvis was performed using the standard protocol following bolus administration of intravenous contrast. CONTRAST:  124m OMNIPAQUE IOHEXOL 300 MG/ML  SOLN COMPARISON:  CT 06/07/2020 FINDINGS: Lower chest: No pleural fluid or focal airspace disease. Hepatobiliary: Percutaneous drainage catheter coiled in the gallbladder fossa. No focal fluid collection in the cholecystectomy bed. There is a punctate focus of air at the porta hepatis which is likely related to recent drain interrogation. There are 4 biliary stents extending from the porta hepatis to the duodenum. Common bile duct is decompressed by biliary stents. There is no intrahepatic biliary ductal dilatation. Cirrhotic hepatic morphology with nodular contours again seen. No intrahepatic fluid collection or focal lesion. Patent portal vein. Pancreas: No peripancreatic fat stranding or evidence pancreatitis post ERCP. No pancreatic ductal dilatation. Spleen: Splenomegaly spanning 14.3 cm cranial caudal, unchanged. No focal splenic abnormality. Adrenals/Urinary Tract: Normal adrenal glands. No hydronephrosis or perinephric edema. Homogeneous renal enhancement with symmetric excretion on delayed phase imaging. Urinary bladder is physiologically distended without wall thickening. Stomach/Bowel: Fluid distended stomach. No gastric wall thickening. No duodenal wall thickening. Proximal small bowel are decompressed. Distal small bowel mildly fluid-filled and prominent. There is fecalization of distal small bowel contents. No terminal ileal inflammation. Normal appendix. Large colonic stool with transverse colonic tortuosity.  No colonic wall thickening or pericolonic edema. There is stool in the rectum. Vascular/Lymphatic: Normal caliber abdominal aorta. Patent portal and splenic veins. No mesenteric gas or thrombosis. No abdominopelvic adenopathy. Reproductive: Hysterectomy. Ovaries not visualized. No adnexal mass. Presumed tubal ligation clips. Other: No ascites or free fluid. Tiny focus of air in the porta hepatis is likely related to recent drain manipulation. No pneumoperitoneum. No focal fluid collection in the abdomen or pelvis. Musculoskeletal: There are no acute or suspicious osseous abnormalities. IMPRESSION: 1. Percutaneous drainage catheter coiled in the gallbladder fossa without focal fluid collection. 2. New 4 biliary stents extending from the porta hepatis to the duodenum. Common bile duct is decompressed by biliary stents. No intrahepatic biliary ductal dilatation. 3. No other acute findings. 4. Cirrhotic hepatic morphology with splenomegaly. 5. Large colonic stool burden with transverse colonic tortuosity, suggesting constipation. Distal small bowel mildly fluid-filled and prominent, with fecalization of distal small bowel contents, suggesting slow transit. Electronically Signed   By: MKeith RakeM.D.   On: 06/27/2020 18:39   DG ERCP BILIARY & PANCREATIC DUCTS  Result Date: 06/26/2020 CLINICAL DATA:  Bile leak and status post percutaneous catheter drainage gallbladder fossa as well as prior endoscopic stenting procedures. EXAM: ERCP TECHNIQUE: Multiple spot images obtained with the fluoroscopic device and submitted for interpretation post-procedure. COMPARISON:  Drainage catheter injection study on 06/20/2020, ERCP on 05/27/2020 and multiple prior imaging studies. FINDINGS:  Imaging at the time of ERCP demonstrates removal of the pre-existing endoscopic plastic biliary stent, cholangiogram demonstrating bile leak at the level of the cystic duct stump with opacification of the gallbladder fossa and indwelling  gallbladder fossa drain and balloon sweep maneuver. Final image shows the presence of 4 parallel plastic endoscopic biliary stents within the common bile duct of different lengths. IMPRESSION: Persistent bile leak at the level of the cystic duct stump. Placement of 4 new parallel plastic endoscopic biliary stents within the common bile duct. These images were submitted for radiologic interpretation only. Please see the procedural report for the amount of contrast and the fluoroscopy time utilized. Electronically Signed   By: Aletta Edouard M.D.   On: 06/26/2020 12:30    Chart has been reviewed    Assessment/Plan  55 y.o. female with medical history significant of cirrhosis due to [facility], type 2 diabetes, cholecystitis complicated by persistent bile leak status post biliary stent.,,  DM2, migraines, HLD, HTN, neuropathy, GERD, IBS   Admitted for  early post ERCP pancreatitis  Present on Admission: . Post-ERCP acute pancreatitis -appreciate GI consult, pain control, antiemetic, IV fluids, n.p.o. except for meds.  Repeat lipase in a.m. Continue Cipro started by GI for tonight  . Other cirrhosis of liver ([place]) felt to be secondary to [facility] appreciate GI follow up  . NASH (nonalcoholic steatohepatitis) current appears to be stable Check INR MELD-Na score: 6 at 06/27/2020    . Hypertension associated with diabetes ([place]) allow permissive hypertension for tonight  . Hyperlipidemia associated with type 2 diabetes mellitus ([place]) hold home medications given pancreatitis resume when able  . GERD -PPI  . Diabetic peripheral neuropathy (HCC) chronic stable resume home medications when able to tolerate  . Dehydration -rehydrate   . Chronic constipation significant stool burden will order bowel regimen if no BM will need to advance  . Abdominal pain, right upper quadrant most likely postprocedure secondary to post ERCP pancreatitis pain control  DM2 -hold home medications decrease Basaglar to 15  units while n.p.o. continue to monitor order sliding scale Check hemoglobin T2I and TSH  Metabolic acidosis check lactic acid level   Other plan as per orders.  DVT prophylaxis:  SCD        Code Status:    Code Status: Prior FULL CODE   as per patient   I had personally discussed CODE STATUS with patient     Family Communication:   Family not at  Bedside    Disposition Plan:   To home once workup is complete and patient is stable   Following barriers for discharge:                                                     Pain controlled with PO medications                             able to transition to PO antibiotics                             Will need to be able to tolerate PO  Will likely need home health,                             Will need consultants to evaluate patient prior to discharge                                      Consults called:    [facility] GI made aware Er provider sent a msg  Admission status:  ED Disposition    ED Disposition Condition [place]: [place] [[id number]]  Level of Care: Med-Surg [16]  Covid Evaluation: Asymptomatic Screening Protocol (No Symptoms)  Diagnosis: Pancreatitis [[id number]]  Admitting Physician: Toy Baker [[id number]]  Attending Physician: Toy Baker [[id number]]        Obs     Level of care      medical floor      Lab Results  Component Value Date   [place] 06/22/2020     Precautions: admitted as asymptomatic screening protocol   PPE: Used by the provider:   P100  eye Goggles,  Gloves      Lenzie Sandler 06/27/2020, 11:49 PM    [facility]     after 2 AM please page floor coverage PA If 7AM-7PM, please contact the day team taking care of the patient using [email or web address]   Patient was evaluated in the context of the global COVID-19 pandemic, which necessitated consideration that the patient might be at risk for infection with  the SARS-CoV-2 virus that causes COVID-19. Institutional protocols and algorithms that pertain to the evaluation of patients at risk for COVID-19 are in a state of rapid change based on information released by regulatory bodies including the CDC and federal and state organizations. These policies and algorithms were followed during the patient's care.

## 2020-06-27 NOTE — Telephone Encounter (Signed)
The pt had ERCP yesterday and woke up today with severe 9/10 lower abd pain and upper back pain between the shoulder blades.  She has tried Tylenol extra strength with no relief.  She also complains of chills and is unsure if she is febrile.  She has been advised to go to the ED for evaluation of the severe pain post ERCP.  FYI Dr Rush Landmark

## 2020-06-28 DIAGNOSIS — N179 Acute kidney failure, unspecified: Secondary | ICD-10-CM | POA: Diagnosis not present

## 2020-06-28 DIAGNOSIS — R6521 Severe sepsis with septic shock: Secondary | ICD-10-CM | POA: Diagnosis not present

## 2020-06-28 DIAGNOSIS — E1142 Type 2 diabetes mellitus with diabetic polyneuropathy: Secondary | ICD-10-CM | POA: Diagnosis present

## 2020-06-28 DIAGNOSIS — G928 Other toxic encephalopathy: Secondary | ICD-10-CM | POA: Diagnosis not present

## 2020-06-28 DIAGNOSIS — J189 Pneumonia, unspecified organism: Secondary | ICD-10-CM | POA: Diagnosis not present

## 2020-06-28 DIAGNOSIS — J690 Pneumonitis due to inhalation of food and vomit: Secondary | ICD-10-CM | POA: Diagnosis not present

## 2020-06-28 DIAGNOSIS — G9341 Metabolic encephalopathy: Secondary | ICD-10-CM | POA: Diagnosis not present

## 2020-06-28 DIAGNOSIS — T82594A Other mechanical complication of infusion catheter, initial encounter: Secondary | ICD-10-CM | POA: Diagnosis not present

## 2020-06-28 DIAGNOSIS — K2970 Gastritis, unspecified, without bleeding: Secondary | ICD-10-CM | POA: Diagnosis present

## 2020-06-28 DIAGNOSIS — K859 Acute pancreatitis without necrosis or infection, unspecified: Secondary | ICD-10-CM | POA: Diagnosis not present

## 2020-06-28 DIAGNOSIS — R4701 Aphasia: Secondary | ICD-10-CM | POA: Diagnosis not present

## 2020-06-28 DIAGNOSIS — T8579XA Infection and inflammatory reaction due to other internal prosthetic devices, implants and grafts, initial encounter: Secondary | ICD-10-CM | POA: Diagnosis not present

## 2020-06-28 DIAGNOSIS — K8500 Idiopathic acute pancreatitis without necrosis or infection: Secondary | ICD-10-CM

## 2020-06-28 DIAGNOSIS — E119 Type 2 diabetes mellitus without complications: Secondary | ICD-10-CM | POA: Diagnosis not present

## 2020-06-28 DIAGNOSIS — E872 Acidosis: Secondary | ICD-10-CM | POA: Diagnosis not present

## 2020-06-28 DIAGNOSIS — Y838 Other surgical procedures as the cause of abnormal reaction of the patient, or of later complication, without mention of misadventure at the time of the procedure: Secondary | ICD-10-CM | POA: Diagnosis present

## 2020-06-28 DIAGNOSIS — K839 Disease of biliary tract, unspecified: Secondary | ICD-10-CM | POA: Diagnosis not present

## 2020-06-28 DIAGNOSIS — Y92239 Unspecified place in hospital as the place of occurrence of the external cause: Secondary | ICD-10-CM | POA: Diagnosis not present

## 2020-06-28 DIAGNOSIS — X58XXXA Exposure to other specified factors, initial encounter: Secondary | ICD-10-CM | POA: Diagnosis present

## 2020-06-28 DIAGNOSIS — R1084 Generalized abdominal pain: Secondary | ICD-10-CM | POA: Diagnosis not present

## 2020-06-28 DIAGNOSIS — Z888 Allergy status to other drugs, medicaments and biological substances status: Secondary | ICD-10-CM | POA: Diagnosis not present

## 2020-06-28 DIAGNOSIS — T402X1A Poisoning by other opioids, accidental (unintentional), initial encounter: Secondary | ICD-10-CM | POA: Diagnosis not present

## 2020-06-28 DIAGNOSIS — R4182 Altered mental status, unspecified: Secondary | ICD-10-CM | POA: Diagnosis not present

## 2020-06-28 DIAGNOSIS — K9189 Other postprocedural complications and disorders of digestive system: Secondary | ICD-10-CM | POA: Diagnosis not present

## 2020-06-28 DIAGNOSIS — R1011 Right upper quadrant pain: Secondary | ICD-10-CM | POA: Diagnosis not present

## 2020-06-28 DIAGNOSIS — Z8249 Family history of ischemic heart disease and other diseases of the circulatory system: Secondary | ICD-10-CM | POA: Diagnosis not present

## 2020-06-28 DIAGNOSIS — Z9049 Acquired absence of other specified parts of digestive tract: Secondary | ICD-10-CM | POA: Diagnosis not present

## 2020-06-28 DIAGNOSIS — Z20822 Contact with and (suspected) exposure to covid-19: Secondary | ICD-10-CM | POA: Diagnosis not present

## 2020-06-28 DIAGNOSIS — Z90710 Acquired absence of both cervix and uterus: Secondary | ICD-10-CM | POA: Diagnosis not present

## 2020-06-28 DIAGNOSIS — A419 Sepsis, unspecified organism: Secondary | ICD-10-CM | POA: Diagnosis not present

## 2020-06-28 DIAGNOSIS — Z9842 Cataract extraction status, left eye: Secondary | ICD-10-CM | POA: Diagnosis not present

## 2020-06-28 DIAGNOSIS — Z794 Long term (current) use of insulin: Secondary | ICD-10-CM | POA: Diagnosis not present

## 2020-06-28 DIAGNOSIS — J9601 Acute respiratory failure with hypoxia: Secondary | ICD-10-CM | POA: Diagnosis not present

## 2020-06-28 DIAGNOSIS — Z9841 Cataract extraction status, right eye: Secondary | ICD-10-CM | POA: Diagnosis not present

## 2020-06-28 DIAGNOSIS — Z833 Family history of diabetes mellitus: Secondary | ICD-10-CM | POA: Diagnosis not present

## 2020-06-28 DIAGNOSIS — K7581 Nonalcoholic steatohepatitis (NASH): Secondary | ICD-10-CM | POA: Diagnosis not present

## 2020-06-28 DIAGNOSIS — T85898A Other specified complication of other internal prosthetic devices, implants and grafts, initial encounter: Secondary | ICD-10-CM | POA: Diagnosis present

## 2020-06-28 DIAGNOSIS — K838 Other specified diseases of biliary tract: Secondary | ICD-10-CM | POA: Diagnosis present

## 2020-06-28 DIAGNOSIS — K7460 Unspecified cirrhosis of liver: Secondary | ICD-10-CM | POA: Diagnosis present

## 2020-06-28 LAB — COMPREHENSIVE METABOLIC PANEL
ALT: 27 U/L (ref 0–44)
AST: 38 U/L (ref 15–41)
Albumin: 3.5 g/dL (ref 3.5–5.0)
Alkaline Phosphatase: 117 U/L (ref 38–126)
Anion gap: 6 (ref 5–15)
BUN: 16 mg/dL (ref 6–20)
CO2: 21 mmol/L — ABNORMAL LOW (ref 22–32)
Calcium: 8.6 mg/dL — ABNORMAL LOW (ref 8.9–10.3)
Chloride: 112 mmol/L — ABNORMAL HIGH (ref 98–111)
Creatinine, Ser: 0.43 mg/dL — ABNORMAL LOW (ref 0.44–1.00)
GFR, Estimated: >60 mL/min (ref >60)
Glucose, Bld: 186 mg/dL — ABNORMAL HIGH (ref 70–99)
Potassium: 4 mmol/L (ref 3.5–5.1)
Sodium: 139 mmol/L (ref 135–145)
Total Bilirubin: 0.5 mg/dL (ref 0.3–1.2)
Total Protein: 6.7 g/dL (ref 6.5–8.1)

## 2020-06-28 LAB — CBC WITH DIFFERENTIAL/PLATELET
Abs Immature Granulocytes: 0.02 10*3/uL (ref 0.00–0.07)
Basophils Absolute: 0 10*3/uL (ref 0.0–0.1)
Basophils Relative: 0 %
Eosinophils Absolute: 0.4 10*3/uL (ref 0.0–0.5)
Eosinophils Relative: 5 %
HCT: 32.5 % — ABNORMAL LOW (ref 36.0–46.0)
Hemoglobin: 9.3 g/dL — ABNORMAL LOW (ref 12.0–15.0)
Immature Granulocytes: 0 %
Lymphocytes Relative: 25 %
Lymphs Abs: 1.7 10*3/uL (ref 0.7–4.0)
MCH: 24.1 pg — ABNORMAL LOW (ref 26.0–34.0)
MCHC: 28.6 g/dL — ABNORMAL LOW (ref 30.0–36.0)
MCV: 84.2 fL (ref 80.0–100.0)
Monocytes Absolute: 0.4 10*3/uL (ref 0.1–1.0)
Monocytes Relative: 7 %
Neutro Abs: 4.2 10*3/uL (ref 1.7–7.7)
Neutrophils Relative %: 63 %
Platelets: 144 10*3/uL — ABNORMAL LOW (ref 150–400)
RBC: 3.86 MIL/uL — ABNORMAL LOW (ref 3.87–5.11)
RDW: 16.1 % — ABNORMAL HIGH (ref 11.5–15.5)
WBC: 6.8 10*3/uL (ref 4.0–10.5)
nRBC: 0 % (ref 0.0–0.2)

## 2020-06-28 LAB — GLUCOSE, CAPILLARY
Glucose-Capillary: 174 mg/dL — ABNORMAL HIGH (ref 70–99)
Glucose-Capillary: 136 mg/dL — ABNORMAL HIGH (ref 70–99)
Glucose-Capillary: 105 mg/dL — ABNORMAL HIGH (ref 70–99)
Glucose-Capillary: 103 mg/dL — ABNORMAL HIGH (ref 70–99)
Glucose-Capillary: 93 mg/dL (ref 70–99)

## 2020-06-28 LAB — CBG MONITORING, ED: Glucose-Capillary: 150 mg/dL — ABNORMAL HIGH (ref 70–99)

## 2020-06-28 LAB — MAGNESIUM: Magnesium: 1.8 mg/dL (ref 1.7–2.4)

## 2020-06-28 LAB — LIPASE, BLOOD: Lipase: 1838 U/L — ABNORMAL HIGH (ref 11–51)

## 2020-06-28 LAB — LACTIC ACID, PLASMA: Lactic Acid, Venous: 0.7 mmol/L (ref 0.5–1.9)

## 2020-06-28 LAB — TSH: TSH: 4.225 u[IU]/mL (ref 0.350–4.500)

## 2020-06-28 LAB — PHOSPHORUS: Phosphorus: 2.7 mg/dL (ref 2.5–4.6)

## 2020-06-28 LAB — SARS CORONAVIRUS 2 (TAT 6-24 HRS): SARS Coronavirus 2: NEGATIVE

## 2020-06-28 LAB — CK: Total CK: 22 U/L — ABNORMAL LOW (ref 38–234)

## 2020-06-28 NOTE — Progress Notes (Addendum)
Inpatient Diabetes Program Recommendations  AACE/ADA: New Consensus Statement on Inpatient Glycemic Control (2015)  Target Ranges:  Prepandial:   less than 140 mg/dL      Peak postprandial:   less than 180 mg/dL (1-2 hours)      Critically ill patients:  140 - 180 mg/dL   Lab Results  Component Value Date   GLUCAP 136 (H) 06/28/2020   HGBA1C 8.9 (H) 06/27/2020    Review of Glycemic Control  Diabetes history: DM2 Outpatient Diabetes medications: Trulicity 4 mg qwk, Synjardy 25/1000 mg QD, Basaglar 30 units QHS, Metformin 1000 mg QHS Current orders for Inpatient glycemic control: Lantus 15 units QHS, Novolog 0-9 units Q4H  HgbA1C - 8.9% CBGs 174, 136 mg/dL  Inpatient Diabetes Program Recommendations:     Agree with orders.  Needs tighter glycemic control at home.  Will speak with pt today about importance of tighter control and elevated HgbA1C.  Continue to follow.  Thank you. Lorenda Peck, RD, LDN, CDE Inpatient Diabetes Coordinator [phone]    Addendum: Spoke with pt about her HgbA1C of 8.8% and diabetes control at home. Pt states she checks blood sugars regularly, tries to eat a healthy diet and leaves off sweets. Uses glucose tablets for occasional hypoglycemia. Has recently increased Basaglar by 8 units per PCP and has f/u Q 3 months. Pt states she does not get very much exercise d/t feeling unsteady on her feet. Encouraged her to try to move around as much as possible within her limitations. Answered questions. Appreciative of visit.  RV

## 2020-06-28 NOTE — Progress Notes (Signed)
PROGRESS NOTE    Kim Hicks  HYW:[id number] DOB: 01-Jul-1965 DOA: 06/27/2020 PCP: [name], FNP   Chief Complaint  Patient presents with  . Post-op Problem  . Abdominal Pain  Brief Narrative: 55 year old female with history of liver cirrhosis from NASH, type 2 diabetes, type 2 diabetes mellitus, migraine, hyperlipidemia, hypertension, neuropathy, GERD, IBS, cholecystitis complicated by persistent bile leak who had ERCP for infected biloma, had a stent placed x4  On 06/26/20 presented to the ED with severe abdominal pain, chills.  She was seen in the ED and found to have post ERCP acute pancreatitis-with CT scan showing percutaneous drainage catheter coiled in the gallbladder fossa without focal fluid collection, large colonic stool burden, cirrhotic hepatic morphology, new for biliary stents.  Lipase significantly elevated, GI was consulted placed on IV antibiotic and was admitted.  Subjective:  Seen this morning still complaining of pain on mid to lower abdomen Overnight afebrile, blood pressure stable, doing well on room air.  Assessment & Plan:  Pancreatitis post ERCP: Lipase level elevated 1800, LFTs normal, CT scan no evidence of pancreatitis.  Appreciate GI input continue diet n.p.o., IV Cipro, monitor LFTs lipase closely, continue IV fluid hydration.  GI is following closely.  Continue pain control due to ongoing abdominal pain  Diabetes mellitus type 2, insulin-dependent on Basaglar 30 units nightly, Trulicity 4 mg every week and Synjardy metformin.  For now we will continue on sliding scale insulin monitor CBC closely.  Hemoglobin A1c poorly controlled at 8.9. Recent Labs  Lab 06/27/20 2221 06/28/20 0015 06/28/20 0411 06/28/20 0740 06/28/20 1138  GLUCAP 145* 150* 174* 485* 462*   Metabolic acidosis: Bicarb 21.  Monitor.  Lactic acid is normal.  Anemia of chronic disease hemoglobin with 9 g range. Recent Labs  Lab 06/27/20 1658 06/28/20 0446  HGB 9.8* 9.3*  HCT  33.5* 32.5*   HTN/HLD: Blood pressure is well controlled, hold home meds for now.  Holding statin due to pancreatitis.  GERD continue PPI  Chronic constipation: Noted in the CT scan laxative as tolerated  Liver cirrhosis due to NASH-GI following, LFTs normal.  Nutrition: Diet Order            Diet NPO time specified Except for: Sips with Meds  Diet effective now                 Pt's Body mass index is 26.63 kg/m.  DVT prophylaxis: SCDs Start: 06/27/20 2058 Code Status:   Code Status: Full Code  Family Communication: plan of care discussed with patient at bedside.  Status is: Admitted as observation  Patient will need at least 2 midnight stay for ongoing management of post ERCP pancreatitis severe abdominal pain and is needing IV opiates.  Dispo: The patient is from: Home              Anticipated d/c is to: Home              Anticipated d/c date is: 2 days              Patient currently is not medically stable to d/c.   Difficult to place patient No  Consultants:see note  Procedures:see note  Culture/Microbiology    Component Value Date/Time   SDES  05/23/2020 1557    ABSCESS GALLBLADDER FOSSA FLUID Performed at [facility] [address] [place], [place] [address]    [place]  05/23/2020 1557    Normal Performed at [facility], [place]  [address], [place], [place] [address]    CULT  05/23/2020 1557    RARE ENTEROCOCCUS FAECALIS NO ANAEROBES ISOLATED Performed at [facility], [place] [address], [place], [place] [address]    REPTSTATUS 05/28/2020 FINAL 05/23/2020 1557    Other culture-see note  Medications: Scheduled Meds: . brexpiprazole  2 mg Oral QHS  . insulin aspart  0-9 Units Subcutaneous Q4H  . insulin glargine  15 Units Subcutaneous QHS  . pantoprazole (PROTONIX) IV  40 mg Intravenous QHS  . pregabalin  100 mg Oral TID  . QUEtiapine  800 mg Oral QHS  . senna  1 tablet Oral BID  . topiramate  200 mg  Oral BID  . vortioxetine HBr  20 mg Oral QHS  . ziprasidone  20 mg Oral QHS   Continuous Infusions: . sodium chloride 125 mL/hr at 06/28/20 1140  . ciprofloxacin 400 mg (06/28/20 6203)    Antimicrobials: Anti-infectives (From admission, onward)   Start     Dose/Rate Route Frequency Ordered Stop   06/27/20 2100  ciprofloxacin (CIPRO) IVPB 400 mg        400 mg 200 mL/hr over 60 Minutes Intravenous Every 12 hours 06/27/20 2057       Objective: Vitals: Today's Vitals   06/28/20 0518 06/28/20 0905 06/28/20 0932 06/28/20 1159  BP: 103/64  112/73   Pulse: 93  89   Resp: 18  15   Temp: 98.2 F (36.8 C)  98.2 F (36.8 C)   TempSrc: Oral  Oral   SpO2: 96%     Weight:      Height:      PainSc:  10-Worst pain ever  9     Intake/Output Summary (Last 24 hours) at 06/28/2020 1218 Last data filed at 06/28/2020 0900 Gross per 24 hour  Intake 1144.35 ml  Output 450 ml  Net 694.35 ml   Filed Weights   06/27/20 1552  Weight: 72.6 kg   Weight change:   Intake/Output from previous day: 02/03 0701 - 02/04 0700 In: 1144.4 [I.V.:941; IV Piggyback:203.3] Out: -  Intake/Output this shift: Total I/O In: -  Out: 450 [Urine:450] Filed Weights   06/27/20 1552  Weight: 72.6 kg    Examination: General exam: AAOx3 in pain,NAD, weak appearing. HEENT:Oral mucosa moist, Ear/Nose WNL grossly,dentition normal. Respiratory system: bilaterally diminished,no wheezing or crackles,no use of accessory muscle, non tender. Cardiovascular system: S1 & S2 +, regular, No JVD. Gastrointestinal system: Abdomen soft, tender in the mid abdomen, obsese,BS+. Nervous System:Alert, awake, moving extremities and grossly nonfocal Extremities: No edema, distal peripheral pulses palpable.  Skin: No rashes,no icterus. MSK: Normal muscle bulk,tone, power  Data Reviewed: I have personally reviewed following labs and imaging studies CBC: Recent Labs  Lab 06/27/20 1658 06/28/20 0446  WBC 6.2 6.8  NEUTROABS 3.7  4.2  HGB 9.8* 9.3*  HCT 33.5* 32.5*  MCV 83.3 84.2  PLT 161 559*   Basic Metabolic Panel: Recent Labs  Lab 06/27/20 1658 06/28/20 0446  NA 139 139  K 4.2 4.0  CL 109 112*  CO2 19* 21*  GLUCOSE 356* 186*  BUN 25* 16  CREATININE 0.83 0.43*  CALCIUM 8.7* 8.6*  MG  --  1.8  PHOS  --  2.7   GFR: Estimated Creatinine Clearance: 79.3 mL/min (A) (by C-G formula based on SCr of 0.43 mg/dL (L)). Liver Function Tests: Recent Labs  Lab 06/27/20 1658 06/28/20 0446  AST 44* 38  ALT 30 27  ALKPHOS 142* 117  BILITOT  0.4 0.5  PROT 7.7 6.7  ALBUMIN 3.9 3.5   Recent Labs  Lab 06/27/20 1658 06/28/20 0446  LIPASE 1,155* 1,838*   No results for input(s): AMMONIA in the last 168 hours. Coagulation Profile: Recent Labs  Lab 06/27/20 1700  INR 1.0   Cardiac Enzymes: Recent Labs  Lab 06/28/20 0446  CKTOTAL 22*   BNP (last 3 results) No results for input(s): PROBNP in the last 8760 hours. HbA1C: Recent Labs    06/27/20 1700  HGBA1C 8.9*   CBG: Recent Labs  Lab 06/27/20 2221 06/28/20 0015 06/28/20 0411 06/28/20 0740 06/28/20 1138  GLUCAP 145* 150* 174* 136* 105*   Lipid Profile: No results for input(s): CHOL, HDL, LDLCALC, TRIG, CHOLHDL, LDLDIRECT in the last 72 hours. Thyroid Function Tests: Recent Labs    06/28/20 0446  TSH 4.225   Anemia Panel: No results for input(s): VITAMINB12, FOLATE, FERRITIN, TIBC, IRON, RETICCTPCT in the last 72 hours. Sepsis Labs: Recent Labs  Lab 06/28/20 0446  LATICACIDVEN 0.7    Recent Results (from the past 240 hour(s))  SARS CORONAVIRUS 2 (TAT 6-24 HRS) Nasopharyngeal Nasopharyngeal Swab     Status: None   Collection Time: 06/22/20  1:27 PM   Specimen: Nasopharyngeal Swab  Result Value Ref Range Status   SARS Coronavirus 2 NEGATIVE NEGATIVE Final    Comment: (NOTE) SARS-CoV-2 target nucleic acids are NOT DETECTED.  The SARS-CoV-2 RNA is generally detectable in upper and lower respiratory specimens during the  acute phase of infection. Negative results do not preclude SARS-CoV-2 infection, do not rule out co-infections with other pathogens, and should not be used as the sole basis for treatment or other patient management decisions. Negative results must be combined with clinical observations, patient history, and epidemiological information. The expected result is Negative.  Fact Sheet for Patients: SugarRoll.be  Fact Sheet for Healthcare Providers: https://www.woods-mathews.com/  This test is not yet approved or cleared by the [place] FDA and  has been authorized for detection and/or diagnosis of SARS-CoV-2 by FDA under an Emergency Use Authorization (EUA). This EUA will remain  in effect (meaning this test can be used) for the duration of the COVID-19 declaration under Se ction 564(b)(1) of the Act, 21 U.S.C. section 360bbb-3(b)(1), unless the authorization is terminated or revoked sooner.  Performed at [facility], [place] [address], [place], [place] [address]   SARS CORONAVIRUS 2 (TAT 6-24 HRS) Nasopharyngeal Nasopharyngeal Swab     Status: None   Collection Time: 06/27/20  8:37 PM   Specimen: Nasopharyngeal Swab  Result Value Ref Range Status   SARS Coronavirus 2 NEGATIVE NEGATIVE Final    Comment: (NOTE) SARS-CoV-2 target nucleic acids are NOT DETECTED.  The SARS-CoV-2 RNA is generally detectable in upper and lower respiratory specimens during the acute phase of infection. Negative results do not preclude SARS-CoV-2 infection, do not rule out co-infections with other pathogens, and should not be used as the sole basis for treatment or other patient management decisions. Negative results must be combined with clinical observations, patient history, and epidemiological information. The expected result is Negative.  Fact Sheet for Patients: SugarRoll.be  Fact Sheet for Healthcare  Providers: https://www.woods-mathews.com/  This test is not yet approved or cleared by the [place] FDA and  has been authorized for detection and/or diagnosis of SARS-CoV-2 by FDA under an Emergency Use Authorization (EUA). This EUA will remain  in effect (meaning this test can be used) for the duration of the COVID-19 declaration under Se ction  564(b)(1) of the Act, 21 U.S.C. section 360bbb-3(b)(1), unless the authorization is terminated or revoked sooner.  Performed at [facility], [place] [address], [place], [place] [address]      Radiology Studies: CT ABDOMEN PELVIS W CONTRAST  Result Date: 06/27/2020 CLINICAL DATA:  Right lower quadrant pain. ERCP with biliary stent exchange yesterday. EXAM: CT ABDOMEN AND PELVIS WITH CONTRAST TECHNIQUE: Multidetector CT imaging of the abdomen and pelvis was performed using the standard protocol following bolus administration of intravenous contrast. CONTRAST:  163m OMNIPAQUE IOHEXOL 300 MG/ML  SOLN COMPARISON:  CT 06/07/2020 FINDINGS: Lower chest: No pleural fluid or focal airspace disease. Hepatobiliary: Percutaneous drainage catheter coiled in the gallbladder fossa. No focal fluid collection in the cholecystectomy bed. There is a punctate focus of air at the porta hepatis which is likely related to recent drain interrogation. There are 4 biliary stents extending from the porta hepatis to the duodenum. Common bile duct is decompressed by biliary stents. There is no intrahepatic biliary ductal dilatation. Cirrhotic hepatic morphology with nodular contours again seen. No intrahepatic fluid collection or focal lesion. Patent portal vein. Pancreas: No peripancreatic fat stranding or evidence pancreatitis post ERCP. No pancreatic ductal dilatation. Spleen: Splenomegaly spanning 14.3 cm cranial caudal, unchanged. No focal splenic abnormality. Adrenals/Urinary Tract: Normal adrenal glands. No hydronephrosis or perinephric edema. Homogeneous  renal enhancement with symmetric excretion on delayed phase imaging. Urinary bladder is physiologically distended without wall thickening. Stomach/Bowel: Fluid distended stomach. No gastric wall thickening. No duodenal wall thickening. Proximal small bowel are decompressed. Distal small bowel mildly fluid-filled and prominent. There is fecalization of distal small bowel contents. No terminal ileal inflammation. Normal appendix. Large colonic stool with transverse colonic tortuosity. No colonic wall thickening or pericolonic edema. There is stool in the rectum. Vascular/Lymphatic: Normal caliber abdominal aorta. Patent portal and splenic veins. No mesenteric gas or thrombosis. No abdominopelvic adenopathy. Reproductive: Hysterectomy. Ovaries not visualized. No adnexal mass. Presumed tubal ligation clips. Other: No ascites or free fluid. Tiny focus of air in the porta hepatis is likely related to recent drain manipulation. No pneumoperitoneum. No focal fluid collection in the abdomen or pelvis. Musculoskeletal: There are no acute or suspicious osseous abnormalities. IMPRESSION: 1. Percutaneous drainage catheter coiled in the gallbladder fossa without focal fluid collection. 2. New 4 biliary stents extending from the porta hepatis to the duodenum. Common bile duct is decompressed by biliary stents. No intrahepatic biliary ductal dilatation. 3. No other acute findings. 4. Cirrhotic hepatic morphology with splenomegaly. 5. Large colonic stool burden with transverse colonic tortuosity, suggesting constipation. Distal small bowel mildly fluid-filled and prominent, with fecalization of distal small bowel contents, suggesting slow transit. Electronically Signed   By: MKeith RakeM.D.   On: 06/27/2020 18:39     LOS: 0 days   RAntonieta Pert MD [facility]  06/28/2020, 12:18 PM

## 2020-06-28 NOTE — Plan of Care (Signed)
  Problem: Education: Goal: Knowledge of General Education information will improve Description: Including pain rating scale, medication(s)/side effects and non-pharmacologic comfort measures Outcome: Adequate for Discharge   

## 2020-06-28 NOTE — ED Notes (Signed)
Report attempted at at this time.

## 2020-06-28 NOTE — Consult Note (Addendum)
Referring Provider:  EDP Primary Care Physician:  [name], FNP Primary Gastroenterologist:  Dr. Fuller Plan  Reason for Consultation:  Abdominal pain and elevated lipase  HPI: Kim Hicks is a 55 y.o. female with a past medical history of anxiety, depression, hypertension, diabetes mellitus type 2, neuropathy, constipation and cirrhosis.  S/P laparoscopic cholecystectomy in March 2021 with a post op bile leak. ERCP performed with a cystic duct leak noted and an 8.5 fr biliary sent was placed a few days post op. ERCP in June 2021 demonstrated a persistent leak from the cystic duct and the 8.5 fr stent was exchanged for a 10 fr biliary stent.  She underwent a repeat ERCP by Dr. Fuller Plan on 05/13/2020 and a clogged biliary stent was removed with evidence of a persistent bile leak identified at the cystic duct.  A fully covered metal stent was placed in the common bile duct.  Then another ERCP by Dr. Fuller Plan performed on 05/27/2020 with metal biliary stent partially migrated into the duodenum, which was removed and there was no bile leak demonstrated, one plastic stent was placed into the CBD.  But then she was noted to have ongoing bile leak on tube check on 06/20/2020.  Then repeat ERCP on 06/26/2020 by Dr. Rush Landmark with one partially occluded migrated stent from the biliary tree was seen in the major papilla that was removed.  The entire main bile duct was moderately dilated.  The biliary tree was swept and sludge was found.  Biliary extravasation was noted from cystic duct into the percutaneous drain.  Four plastic biliary stents were placed into the common bile duct.  Started with abdominal pain after her last procedure along with nausea and vomiting.  Came back to the hospital.  Says that her output has been a lot more than what it was previously.  Lipase 1800, LFTs normal.    CT abdomen and pelvis with contrast showed the following:  IMPRESSION: 1. Percutaneous drainage catheter coiled in the  gallbladder fossa without focal fluid collection. 2. New 4 biliary stents extending from the porta hepatis to the duodenum. Common bile duct is decompressed by biliary stents. No intrahepatic biliary ductal dilatation. 3. No other acute findings. 4. Cirrhotic hepatic morphology with splenomegaly. 5. Large colonic stool burden with transverse colonic tortuosity, suggesting constipation. Distal small bowel mildly fluid-filled and prominent, with fecalization of distal small bowel contents, suggesting slow transit.  Pain is no better yet but the pain medication does help.  Past Medical History:  Diagnosis Date  . Anxiety   . Cataract   . Cirrhosis of liver ([place])   . Depression   . Diabetes mellitus without complication ([place])   . Dysrhythmia   . Edema   . Family history of adverse reaction to anesthesia    mother had trouble waking up after surgery  . Foot fracture, left   . Headache    MIGRAINES  . Hyperlipidemia   . Hypertension   . Neuromuscular disorder ([place])    NEUROPATHY-both hands and feet  . Palpitations   . Wheezing     Past Surgical History:  Procedure Laterality Date  . ABDOMINAL HYSTERECTOMY     one ovary left  . BILIARY STENT PLACEMENT N/A 08/07/2019   Procedure: BILIARY STENT PLACEMENT;  Surgeon: [name], MD;  Location: [facility];  Service: Endoscopy;  Laterality: N/A;  . BILIARY STENT PLACEMENT N/A 11/07/2019   Procedure: BILIARY STENT PLACEMENT;  Surgeon: [name], MD;  Location: [facility];  Service: Endoscopy;  Laterality: N/A;  . BILIARY STENT PLACEMENT N/A 05/13/2020   Procedure: BILIARY STENT PLACEMENT;  Surgeon: [name], MD;  [facility]: [facility];  Service: Endoscopy;  Laterality: N/A;  . BILIARY STENT PLACEMENT N/A 05/27/2020   Procedure: BILIARY STENT PLACEMENT;  Surgeon: [name], MD;  Location: [facility];  Service: Endoscopy;  Laterality: N/A;  . BILIARY STENT PLACEMENT N/A 06/26/2020   Procedure: BILIARY  STENT PLACEMENT;  Surgeon: [name], MD;  Location: [facility];  Service: Gastroenterology;  Laterality: N/A;  . BIOPSY  06/26/2020   Procedure: BIOPSY;  Surgeon: [name], MD;  Location: [facility];  Service: Gastroenterology;;  . CATARACT EXTRACTION W/PHACO Right 01/11/2018   Procedure: CATARACT EXTRACTION PHACO AND INTRAOCULAR LENS PLACEMENT ([place]);  Surgeon: [name], MD;  Location: [facility];  Service: Ophthalmology;  Laterality: Right;  [place] 00:57.5 AP% 15.0 CDE 8.61 Fluid Pack Lot # [id number]  . CATARACT EXTRACTION W/PHACO Left 02/15/2018   Procedure: CATARACT EXTRACTION PHACO AND INTRAOCULAR LENS PLACEMENT ([facility]);  Surgeon: [name], MD;  Location: [facility];  Service: Ophthalmology;  Laterality: Left;  [place] 00:51 AP% 13.2 CDE 6.82 Fluid p ack lot # [id number]  . CHOLECYSTECTOMY N/A 08/04/2019   Procedure: LAPAROSCOPIC CHOLECYSTECTOMY;  Surgeon: [name], MD;  Location: [facility];  Service: General;  Laterality: N/A;  . COLONOSCOPY    . ENDOSCOPIC RETROGRADE CHOLANGIOPANCREATOGRAPHY (ERCP) WITH PROPOFOL N/A 11/07/2019   Procedure: ENDOSCOPIC RETROGRADE CHOLANGIOPANCREATOGRAPHY (ERCP) WITH PROPOFOL;  Surgeon: [name], MD;  [facility]: [facility];  Service: Endoscopy;  Laterality: N/A;  . ENDOSCOPIC RETROGRADE CHOLANGIOPANCREATOGRAPHY (ERCP) WITH PROPOFOL N/A 05/13/2020   Procedure: ENDOSCOPIC RETROGRADE CHOLANGIOPANCREATOGRAPHY (ERCP) WITH PROPOFOL;  Surgeon: [name], MD;  [facility]: [facility];  Service: Endoscopy;  Laterality: N/A;  . ENDOSCOPIC RETROGRADE CHOLANGIOPANCREATOGRAPHY (ERCP) WITH PROPOFOL N/A 06/26/2020   Procedure: ENDOSCOPIC RETROGRADE CHOLANGIOPANCREATOGRAPHY (ERCP) WITH PROPOFOL;  Surgeon: [name], MD;  [facility]: [facility];  Service: Gastroenterology;  Laterality: N/A;  . ERCP N/A 08/07/2019   Procedure: ENDOSCOPIC RETROGRADE CHOLANGIOPANCREATOGRAPHY (ERCP);  Surgeon: [name], MD;   Location: [facility];  Service: Endoscopy;  Laterality: N/A;  . ERCP N/A 05/27/2020   Procedure: ENDOSCOPIC RETROGRADE CHOLANGIOPANCREATOGRAPHY (ERCP);  Surgeon: [name], MD;  Location: [facility];  Service: Endoscopy;  Laterality: N/A;  . ESOPHAGOGASTRODUODENOSCOPY N/A 06/26/2020   Procedure: ESOPHAGOGASTRODUODENOSCOPY (EGD);  Surgeon: [name]., MD;  Location: [facility];  Service: Gastroenterology;  Laterality: N/A;  . IR RADIOLOGIST EVAL & MGMT  06/20/2020  . RCR Bilateral   . REMOVAL OF STONES  08/07/2019   Procedure: REMOVAL OF STONES;  Surgeon: [name], MD;  [facility]: [facility];  Service: Endoscopy;;  balloon sweep, no stones  . REMOVAL OF STONES  05/27/2020   Procedure: REMOVAL OF STONES;  Surgeon: [name], MD;  [facility];  Service: Endoscopy;;  . REMOVAL OF STONES  06/26/2020   Procedure: REMOVAL OF STONES;  Surgeon: [name]., MD;  Location: [facility];  Service: Gastroenterology;;  . [name]  08/07/2019   Procedure: SPHINCTEROTOMY;  Surgeon: [name], MD;  [facility];  Service: Endoscopy;;  . STENT REMOVAL  11/07/2019   Procedure: STENT REMOVAL;  Surgeon: [name], MD;  [facility];  Service: Endoscopy;;  . STENT REMOVAL  05/13/2020   Procedure: STENT REMOVAL;  Surgeon: [name], MD;  [facility]: [facility];  Service: Endoscopy;;  . STENT REMOVAL  05/27/2020  Procedure: STENT REMOVAL;  Surgeon: [name], MD;  Location: [facility];  Service: Endoscopy;;  . Lavell Islam REMOVAL  06/26/2020   Procedure: STENT REMOVAL;  Surgeon: [name]., MD;  Location: [facility];  Service: Gastroenterology;;    Prior to Admission medications   Medication Sig Start Date End Date Taking? Authorizing Provider  acetaminophen (TYLENOL) 500 MG tablet Take 1,000 mg by mouth every 6 (six) hours as needed for moderate pain.   Yes [provider]  brexpiprazole  (REXULTI) 2 MG TABS tablet Take 2 mg by mouth at bedtime.   Yes [provider]  Dulaglutide 4.5 MG/0.5ML SOPN Inject 4.5 mg into the skin every Monday. 12/20/18  Yes [provider]  Empagliflozin-metFORMIN HCl ER (SYNJARDY XR) 25-1000 MG TB24 Take 1 tablet by mouth daily before breakfast.  11/15/18  Yes [provider]  ezetimibe (ZETIA) 10 MG tablet Take 10 mg by mouth daily. 04/25/20  Yes [provider]  Insulin Glargine (BASAGLAR KWIKPEN) 100 UNIT/ML Inject 30 Units into the skin at bedtime. 03/20/20  Yes [provider]  LINZESS 290 MCG CAPS capsule Take 290 mcg by mouth every morning. 08/16/19  Yes [provider]  meloxicam (MOBIC) 15 MG tablet Take 15 mg by mouth daily.   Yes [provider]  metFORMIN (GLUCOPHAGE) 1000 MG tablet Take 1,000 mg by mouth at bedtime. 02/27/20  Yes [provider]  Nutritional Supplements (NUTRITIONAL SHAKE HIGH PROTEIN PO) Take 237 mLs by mouth 2 (two) times daily.   Yes [provider]  ondansetron (ZOFRAN-ODT) 4 MG disintegrating tablet Take 4 mg by mouth every 8 (eight) hours as needed for nausea or vomiting.   Yes [provider]  prazosin (MINIPRESS) 2 MG capsule Take 4 mg by mouth daily.   Yes [provider]  pregabalin (LYRICA) 100 MG capsule TAKE 1 CAPSULE BY MOUTH THREE TIMES A DAY Patient taking differently: Take 100 mg by mouth 3 (three) times daily. 02/12/20  Yes [name], NP  QUEtiapine (SEROQUEL) 400 MG tablet Take 800 mg by mouth at bedtime.   Yes [provider]  simvastatin (ZOCOR) 10 MG tablet Take 10 mg by mouth daily. 12/13/17  Yes [provider]  topiramate (TOPAMAX) 200 MG tablet Take 200 mg by mouth 2 (two) times daily. 04/12/20  Yes [provider]  vortioxetine HBr ([name]) 20 MG TABS tablet Take 20 mg by mouth at bedtime.   Yes [provider]  ziprasidone ([name]) 20 MG capsule Take 20 mg by  mouth at bedtime.   Yes [provider]  amoxicillin-clavulanate (AUGMENTIN) 875-125 MG tablet Take 1 tablet by mouth 2 (two) times daily. Patient taking differently: Take 1 tablet by mouth 2 (two) times daily. Start date : 06/13/20 06/13/20   Jabier Mutton, MD  ciprofloxacin (CIPRO) 500 MG tablet Take 1 tablet (500 mg total) by mouth 2 (two) times daily for 3 days. 06/26/20 06/29/20  Mansouraty, Telford Nab., MD  lisinopril (ZESTRIL) 20 MG tablet TAKE 1 TABLET BY MOUTH EVERY DAY Patient not taking: No sig reported 02/08/20   [name], MD  pantoprazole (PROTONIX) 40 MG tablet Take 1 tablet (40 mg total) by mouth daily. Patient not taking: No sig reported 06/26/20 06/26/21  Mansouraty, Telford Nab., MD    Current Facility-Administered Medications  Medication Dose Route Frequency Provider Last Rate Last Admin  . 0.9 %  sodium chloride infusion   Intravenous Continuous Toy Baker, MD 125 mL/hr at 06/28/20  Rosa Sanchez at 06/28/20 0402  . acetaminophen (TYLENOL) tablet 650 mg  650 mg Oral Q6H PRN Toy Baker, MD       Or  . acetaminophen (TYLENOL) suppository 650 mg  650 mg Rectal Q6H PRN Doutova, Anastassia, MD      . bisacodyl (DULCOLAX) suppository 10 mg  10 mg Rectal Daily PRN Doutova, Anastassia, MD      . brexpiprazole (REXULTI) tablet 2 mg  2 mg Oral QHS Doutova, Anastassia, MD   2 mg at 06/27/20 2220  . ciprofloxacin (CIPRO) IVPB 400 mg  400 mg Intravenous Q12H Doutova, Anastassia, MD 200 mL/hr at 06/28/20 0852 400 mg at 06/28/20 0852  . HYDROmorphone (DILAUDID) injection 0.5 mg  0.5 mg Intravenous Q4H PRN Toy Baker, MD   0.5 mg at 06/28/20 0343  . insulin aspart (novoLOG) injection 0-9 Units  0-9 Units Subcutaneous Q4H Toy Baker, MD   1 Units at 06/28/20 0852  . insulin glargine (LANTUS) injection 15 Units  15 Units Subcutaneous QHS Toy Baker, MD   15 Units at 06/27/20 2227  . ondansetron (ZOFRAN) tablet 4 mg  4 mg Oral Q6H PRN Doutova,  Anastassia, MD       Or  . ondansetron (ZOFRAN) injection 4 mg  4 mg Intravenous Q6H PRN Doutova, Anastassia, MD   4 mg at 06/28/20 0402  . pantoprazole (PROTONIX) injection 40 mg  40 mg Intravenous QHS Toy Baker, MD   40 mg at 06/27/20 2223  . pregabalin (LYRICA) capsule 100 mg  100 mg Oral TID Toy Baker, MD   100 mg at 06/27/20 2219  . QUEtiapine (SEROQUEL) tablet 800 mg  800 mg Oral QHS Doutova, Anastassia, MD   800 mg at 06/27/20 2217  . senna (SENOKOT) tablet 8.6 mg  1 tablet Oral BID Doutova, Anastassia, MD      . topiramate (TOPAMAX) tablet 200 mg  200 mg Oral BID Toy Baker, MD   200 mg at 06/27/20 2217  . vortioxetine HBr ([name]) tablet 20 mg  20 mg Oral QHS Toy Baker, MD   20 mg at 06/27/20 2220  . ziprasidone ([name]) capsule 20 mg  20 mg Oral QHS Doutova, Anastassia, MD   20 mg at 06/27/20 2219    Allergies as of 06/27/2020 - Review Complete 06/27/2020  Allergen Reaction Noted  . Talwin [pentazocine]  02/14/2018    Family History  Problem Relation Age of Onset  . Diabetes Mother   . Heart failure Mother   . Depression Mother   . Colon cancer Neg Hx   . Stomach cancer Neg Hx   . Esophageal cancer Neg Hx   . Pancreatic cancer Neg Hx     Social History   Socioeconomic History  . Marital status: Married    Spouse name: Not on file  . Number of children: Not on file  . Years of education: Not on file  . Highest education level: Not on file  Occupational History  . Not on file  Tobacco Use  . Smoking status: Never Smoker  . Smokeless tobacco: Never Used  Vaping Use  . Vaping Use: Never used  Substance and Sexual Activity  . Alcohol use: Never  . Drug use: Never  . Sexual activity: Not on file  Other Topics Concern  . Not on file  Social History Narrative  . Not on file   Social Determinants of Health   Financial Resource Strain: Not on file  Food Insecurity: Not on file  Transportation  Needs: Not on file   Physical Activity: Not on file  Stress: Not on file  Social Connections: Not on file  Intimate Partner Violence: Not on file    Review of Systems: ROS is O/W negative except as mentioned in HPI.  Physical Exam: Vital signs in last 24 hours: Temp:  [97.8 F (36.6 C)-98.6 F (37 C)] 98.2 F (36.8 C) (02/04 0518) Pulse Rate:  [87-103] 93 (02/04 0518) Resp:  [13-25] 18 (02/04 0518) BP: (103-149)/(61-81) 103/64 (02/04 0518) SpO2:  [91 %-100 %] 96 % (02/04 0518) Weight:  [72.6 kg] 72.6 kg (02/03 1552) Last BM Date: 06/27/20 General:  Alert, Well-developed, well-nourished, pleasant and cooperative in NAD Head:  Normocephalic and atraumatic. Eyes:  Sclera clear, no icterus.  Conjunctiva pink. Ears:  Normal auditory acuity. Mouth:  No deformity or lesions.   Lungs:  Clear throughout to auscultation.  No wheezes, crackles, or rhonchi.  Heart:  Regular rate and rhythm; no murmurs, clicks, rubs, or gallops. Abdomen:  Soft, non-distended.  BS present.  Moderate diffuse TTP.  100 cc seen in biliary drain.   Msk:  Symmetrical without gross deformities. Pulses:  Normal pulses noted. Extremities:  Without clubbing or edema. Neurologic:  Alert and oriented x 4;  grossly normal neurologically. Skin:  Intact without significant lesions or rashes. Psych:  Alert and cooperative. Normal mood and affect.  Intake/Output from previous day: 02/03 0701 - 02/04 0700 In: 1144.4 [I.V.:941; IV Piggyback:203.3] Out: -   Lab Results: Recent Labs    06/27/20 1658 06/28/20 0446  WBC 6.2 6.8  HGB 9.8* 9.3*  HCT 33.5* 32.5*  PLT 161 144*   BMET Recent Labs    06/27/20 1658 06/28/20 0446  NA 139 139  K 4.2 4.0  CL 109 112*  CO2 19* 21*  GLUCOSE 356* 186*  BUN 25* 16  CREATININE 0.83 0.43*  CALCIUM 8.7* 8.6*   LFT Recent Labs    06/28/20 0446  PROT 6.7  ALBUMIN 3.5  AST 38  ALT 27  ALKPHOS 117  BILITOT 0.5   PT/INR Recent Labs    06/27/20 1700  LABPROT 12.9  INR 1.0    Studies/Results: CT ABDOMEN PELVIS W CONTRAST  Result Date: 06/27/2020 CLINICAL DATA:  Right lower quadrant pain. ERCP with biliary stent exchange yesterday. EXAM: CT ABDOMEN AND PELVIS WITH CONTRAST TECHNIQUE: Multidetector CT imaging of the abdomen and pelvis was performed using the standard protocol following bolus administration of intravenous contrast. CONTRAST:  176m OMNIPAQUE IOHEXOL 300 MG/ML  SOLN COMPARISON:  CT 06/07/2020 FINDINGS: Lower chest: No pleural fluid or focal airspace disease. Hepatobiliary: Percutaneous drainage catheter coiled in the gallbladder fossa. No focal fluid collection in the cholecystectomy bed. There is a punctate focus of air at the porta hepatis which is likely related to recent drain interrogation. There are 4 biliary stents extending from the porta hepatis to the duodenum. Common bile duct is decompressed by biliary stents. There is no intrahepatic biliary ductal dilatation. Cirrhotic hepatic morphology with nodular contours again seen. No intrahepatic fluid collection or focal lesion. Patent portal vein. Pancreas: No peripancreatic fat stranding or evidence pancreatitis post ERCP. No pancreatic ductal dilatation. Spleen: Splenomegaly spanning 14.3 cm cranial caudal, unchanged. No focal splenic abnormality. Adrenals/Urinary Tract: Normal adrenal glands. No hydronephrosis or perinephric edema. Homogeneous renal enhancement with symmetric excretion on delayed phase imaging. Urinary bladder is physiologically distended without wall thickening. Stomach/Bowel: Fluid distended stomach. No gastric wall thickening. No duodenal wall thickening. Proximal small bowel are decompressed. Distal small  bowel mildly fluid-filled and prominent. There is fecalization of distal small bowel contents. No terminal ileal inflammation. Normal appendix. Large colonic stool with transverse colonic tortuosity. No colonic wall thickening or pericolonic edema. There is stool in the rectum.  Vascular/Lymphatic: Normal caliber abdominal aorta. Patent portal and splenic veins. No mesenteric gas or thrombosis. No abdominopelvic adenopathy. Reproductive: Hysterectomy. Ovaries not visualized. No adnexal mass. Presumed tubal ligation clips. Other: No ascites or free fluid. Tiny focus of air in the porta hepatis is likely related to recent drain manipulation. No pneumoperitoneum. No focal fluid collection in the abdomen or pelvis. Musculoskeletal: There are no acute or suspicious osseous abnormalities. IMPRESSION: 1. Percutaneous drainage catheter coiled in the gallbladder fossa without focal fluid collection. 2. New 4 biliary stents extending from the porta hepatis to the duodenum. Common bile duct is decompressed by biliary stents. No intrahepatic biliary ductal dilatation. 3. No other acute findings. 4. Cirrhotic hepatic morphology with splenomegaly. 5. Large colonic stool burden with transverse colonic tortuosity, suggesting constipation. Distal small bowel mildly fluid-filled and prominent, with fecalization of distal small bowel contents, suggesting slow transit. Electronically Signed   By: Keith Rake M.D.   On: 06/27/2020 18:39   DG ERCP BILIARY & PANCREATIC DUCTS  Result Date: 06/26/2020 CLINICAL DATA:  Bile leak and status post percutaneous catheter drainage gallbladder fossa as well as prior endoscopic stenting procedures. EXAM: ERCP TECHNIQUE: Multiple spot images obtained with the fluoroscopic device and submitted for interpretation post-procedure. COMPARISON:  Drainage catheter injection study on 06/20/2020, ERCP on 05/27/2020 and multiple prior imaging studies. FINDINGS: Imaging at the time of ERCP demonstrates removal of the pre-existing endoscopic plastic biliary stent, cholangiogram demonstrating bile leak at the level of the cystic duct stump with opacification of the gallbladder fossa and indwelling gallbladder fossa drain and balloon sweep maneuver. Final image shows the presence  of 4 parallel plastic endoscopic biliary stents within the common bile duct of different lengths. IMPRESSION: Persistent bile leak at the level of the cystic duct stump. Placement of 4 new parallel plastic endoscopic biliary stents within the common bile duct. These images were submitted for radiologic interpretation only. Please see the procedural report for the amount of contrast and the fluoroscopy time utilized. Electronically Signed   By: Aletta Edouard M.D.   On: 06/26/2020 12:30    IMPRESSION:  55 year old female  S/P laparoscopic cholecystectomy in March 2021 with a post op bile leak. ERCP performed with a cystic duct leak noted and an 8.5 fr biliary sent was placed a few days post op. ERCP in June 2021 demonstrated a persistent leak from the cystic duct and the 8.5 fr stent was exchanged for a 10 fr biliary stent.  She underwent a repeat ERCP by Dr. Fuller Plan on 05/13/2020 and a clogged biliary stent was removed with evidence of a persistent bile leak identified at the cystic duct.  A fully covered metal stent was placed in the common bile duct.Then another ERCP by Dr. Fuller Plan performed on 05/27/2020 with metal biliary stent partially migrated into the duodenum, which was removed and there was no bile leak demonstrated, one plastic stent was placed into the CBD.  But then she was noted to have ongoing bile leak on tube check on 06/20/2020.  Then repeat ERCP on 06/26/2020 by Dr. Rush Landmark with one partially occluded migrated stent from the biliary tree was seen in the major papilla that was removed.  The entire main bile duct was moderately dilated.  The biliary tree was swept and sludge  was found.  Biliary extravasation was noted from cystic duct into the percutaneous drain.  Four plastic biliary stents were placed into the common bile duct. *Post ERCP pancreatitis:  Lipase 1800, LFTs normal.  CT scan with no evidence of pancreatitis.  PLAN: -On IV cipro BID. -Continue IVFs at 125 cc/hr as well as other  supportive care with pain control, antiemetics, etc.   Laban Emperor. Zehr  06/28/2020, 9:07 AM     Attending physician's note   I have taken an interval history, reviewed the chart and examined the patient. I agree with the Advanced Practitioner's note, impression and recommendations.   Persistent post-op bile leak from cystic duct stump s/p multiple ERCPs.  Most recent ERCP 06/26/2020 with placement of 4 plastic biliary stents. CT from yesterday shows good stent position and biliary decompression.  Post ERCP pancreatitis. Mild.  Pancreas does not look inflamed on CT. No subhepatic fluid collections or abscesses. JP drain-bile.  Underlying liver cirrhosis with splenomegaly. No ascites.  Plan: -IV 125 cc/hr x next 24hrs, then KVO -Pain control -Clear liquid diet.  Advance as tolerated. -IV Protonix -Agree with IV Cipro for now. -Expect JP drainage to slow down over the next 24-48 hours.  Drainage is expected to increase after ERCP. -Trend CBC, lipase -D/W patient and patient's husband. -Dr. Havery Moros taking over service tomorrow.   Carmell Austria, MD [facility] [phone]

## 2020-06-28 NOTE — Progress Notes (Signed)
Attempted to call ED nurse back to get report. Patient has been approved and number was listed in handoff to call. Didn't get an answer, called back and waited on hold for 5 mins, needed with another patient at this time, will call back again. Lavonna Monarch

## 2020-06-29 DIAGNOSIS — K859 Acute pancreatitis without necrosis or infection, unspecified: Secondary | ICD-10-CM | POA: Diagnosis not present

## 2020-06-29 DIAGNOSIS — K9189 Other postprocedural complications and disorders of digestive system: Secondary | ICD-10-CM | POA: Diagnosis not present

## 2020-06-29 LAB — COMPREHENSIVE METABOLIC PANEL
ALT: 26 U/L (ref 0–44)
AST: 40 U/L (ref 15–41)
Albumin: 3.4 g/dL — ABNORMAL LOW (ref 3.5–5.0)
Alkaline Phosphatase: 119 U/L (ref 38–126)
Anion gap: 7 (ref 5–15)
BUN: 13 mg/dL (ref 6–20)
CO2: 21 mmol/L — ABNORMAL LOW (ref 22–32)
Calcium: 8.6 mg/dL — ABNORMAL LOW (ref 8.9–10.3)
Chloride: 113 mmol/L — ABNORMAL HIGH (ref 98–111)
Creatinine, Ser: 0.43 mg/dL — ABNORMAL LOW (ref 0.44–1.00)
GFR, Estimated: >60 mL/min (ref >60)
Glucose, Bld: 100 mg/dL — ABNORMAL HIGH (ref 70–99)
Potassium: 3.6 mmol/L (ref 3.5–5.1)
Sodium: 141 mmol/L (ref 135–145)
Total Bilirubin: 0.5 mg/dL (ref 0.3–1.2)
Total Protein: 6.6 g/dL (ref 6.5–8.1)

## 2020-06-29 LAB — GLUCOSE, CAPILLARY
Glucose-Capillary: 100 mg/dL — ABNORMAL HIGH (ref 70–99)
Glucose-Capillary: 109 mg/dL — ABNORMAL HIGH (ref 70–99)
Glucose-Capillary: 129 mg/dL — ABNORMAL HIGH (ref 70–99)
Glucose-Capillary: 142 mg/dL — ABNORMAL HIGH (ref 70–99)
Glucose-Capillary: 230 mg/dL — ABNORMAL HIGH (ref 70–99)
Glucose-Capillary: 72 mg/dL (ref 70–99)
Glucose-Capillary: 72 mg/dL (ref 70–99)

## 2020-06-29 LAB — CBC
HCT: 34.1 % — ABNORMAL LOW (ref 36.0–46.0)
Hemoglobin: 9.8 g/dL — ABNORMAL LOW (ref 12.0–15.0)
MCH: 24.4 pg — ABNORMAL LOW (ref 26.0–34.0)
MCHC: 28.7 g/dL — ABNORMAL LOW (ref 30.0–36.0)
MCV: 84.8 fL (ref 80.0–100.0)
Platelets: 132 10*3/uL — ABNORMAL LOW (ref 150–400)
RBC: 4.02 MIL/uL (ref 3.87–5.11)
RDW: 15.9 % — ABNORMAL HIGH (ref 11.5–15.5)
WBC: 5.2 10*3/uL (ref 4.0–10.5)
nRBC: 0 % (ref 0.0–0.2)

## 2020-06-29 LAB — LIPASE, BLOOD: Lipase: 1052 U/L — ABNORMAL HIGH (ref 11–51)

## 2020-06-29 LAB — C-REACTIVE PROTEIN: CRP: 0.6 mg/dL (ref <1.0)

## 2020-06-29 MED ORDER — OXYCODONE HCL 5 MG PO TABS
5.0000 mg | ORAL_TABLET | Freq: Four times a day (QID) | ORAL | Status: DC | PRN
  Administered 2020-06-29 – 2020-07-03 (×13): 5 mg via ORAL
  Filled 2020-06-29 (×14): qty 1

## 2020-06-29 NOTE — Progress Notes (Signed)
Progress Note   Subjective  Patient states she thinks things are getting better since yesterday but does have some fluctuating pain. Currently 8/10, pain medication helps reduce it and it slowly comes back. She is tolerating clear liquids, starting to get appetite back. No fevers overnight. Nursing reports biliary drain output seems to be coming down.     Objective   Vital signs in last 24 hours: Temp:  [97.5 F (36.4 C)-98.2 F (36.8 C)] 97.5 F (36.4 C) (02/05 0506) Pulse Rate:  [79-89] 83 (02/05 0506) Resp:  [15-17] 16 (02/05 0506) BP: (107-135)/(65-81) 133/72 (02/05 0506) SpO2:  [95 %-98 %] 98 % (02/05 0506) Last BM Date: 06/27/20 General:    white female in NAD Heart:  Regular rate and rhythm; no murmurs Lungs: Respirations even and unlabored,  Abdomen:  Soft, protuberant, epigastric TTP.  Extremities:  Without edema. Neurologic:  Alert and oriented,  grossly normal neurologically. Psych:  Cooperative. Normal mood and affect.  Intake/Output from previous day: 02/04 0701 - 02/05 0700 In: 3652.5 [P.O.:240; I.V.:3012.5; IV Piggyback:400] Out: 1885 [Urine:1750; Drains:60; Stool:75] Intake/Output this shift: No intake/output data recorded.  Lab Results: Recent Labs    06/27/20 1658 06/28/20 0446  WBC 6.2 6.8  HGB 9.8* 9.3*  HCT 33.5* 32.5*  PLT 161 144*   BMET Recent Labs    06/27/20 1658 06/28/20 0446  NA 139 139  K 4.2 4.0  CL 109 112*  CO2 19* 21*  GLUCOSE 356* 186*  BUN 25* 16  CREATININE 0.83 0.43*  CALCIUM 8.7* 8.6*   LFT Recent Labs    06/28/20 0446  PROT 6.7  ALBUMIN 3.5  AST 38  ALT 27  ALKPHOS 117  BILITOT 0.5   PT/INR Recent Labs    06/27/20 1700  LABPROT 12.9  INR 1.0    Studies/Results: CT ABDOMEN PELVIS W CONTRAST  Result Date: 06/27/2020 CLINICAL DATA:  Right lower quadrant pain. ERCP with biliary stent exchange yesterday. EXAM: CT ABDOMEN AND PELVIS WITH CONTRAST TECHNIQUE: Multidetector CT imaging of the abdomen  and pelvis was performed using the standard protocol following bolus administration of intravenous contrast. CONTRAST:  173m OMNIPAQUE IOHEXOL 300 MG/ML  SOLN COMPARISON:  CT 06/07/2020 FINDINGS: Lower chest: No pleural fluid or focal airspace disease. Hepatobiliary: Percutaneous drainage catheter coiled in the gallbladder fossa. No focal fluid collection in the cholecystectomy bed. There is a punctate focus of air at the porta hepatis which is likely related to recent drain interrogation. There are 4 biliary stents extending from the porta hepatis to the duodenum. Common bile duct is decompressed by biliary stents. There is no intrahepatic biliary ductal dilatation. Cirrhotic hepatic morphology with nodular contours again seen. No intrahepatic fluid collection or focal lesion. Patent portal vein. Pancreas: No peripancreatic fat stranding or evidence pancreatitis post ERCP. No pancreatic ductal dilatation. Spleen: Splenomegaly spanning 14.3 cm cranial caudal, unchanged. No focal splenic abnormality. Adrenals/Urinary Tract: Normal adrenal glands. No hydronephrosis or perinephric edema. Homogeneous renal enhancement with symmetric excretion on delayed phase imaging. Urinary bladder is physiologically distended without wall thickening. Stomach/Bowel: Fluid distended stomach. No gastric wall thickening. No duodenal wall thickening. Proximal small bowel are decompressed. Distal small bowel mildly fluid-filled and prominent. There is fecalization of distal small bowel contents. No terminal ileal inflammation. Normal appendix. Large colonic stool with transverse colonic tortuosity. No colonic wall thickening or pericolonic edema. There is stool in the rectum. Vascular/Lymphatic: Normal caliber abdominal aorta. Patent portal and splenic veins. No mesenteric gas or thrombosis.  No abdominopelvic adenopathy. Reproductive: Hysterectomy. Ovaries not visualized. No adnexal mass. Presumed tubal ligation clips. Other: No ascites  or free fluid. Tiny focus of air in the porta hepatis is likely related to recent drain manipulation. No pneumoperitoneum. No focal fluid collection in the abdomen or pelvis. Musculoskeletal: There are no acute or suspicious osseous abnormalities. IMPRESSION: 1. Percutaneous drainage catheter coiled in the gallbladder fossa without focal fluid collection. 2. New 4 biliary stents extending from the porta hepatis to the duodenum. Common bile duct is decompressed by biliary stents. No intrahepatic biliary ductal dilatation. 3. No other acute findings. 4. Cirrhotic hepatic morphology with splenomegaly. 5. Large colonic stool burden with transverse colonic tortuosity, suggesting constipation. Distal small bowel mildly fluid-filled and prominent, with fecalization of distal small bowel contents, suggesting slow transit. Electronically Signed   By: Keith Rake M.D.   On: 06/27/2020 18:39       Assessment / Plan:    55 y/o female with underlying cirrhosis who has persist post-op bile leak from the cystic duct stump, has undergone multiple ERCPs since cholecystectomy for persistent leak / clogged stents. ERCP 06/26/20 with clogged stent removed and 4 plastic stents placed. She was readmitted yesterday with post ERCP pancreatitis. Renal function is normal, no significant leukocytosis, pancreas appeared okay on CT, she appears to have mild pancreatitis. She does feel improved today but still has fluctuating amount of pain, tolerating clears. Biliary drain appears to have decreasing output. Hopefully with a bit more time she can continue to improve.   Plan: - continue IVF  - clear liquid diet now, if she wishes to advance slowly to low fat diet it is up to her, she may try something more substantial later today - continue cipro - pain meds as needed - trend labs, pending for this AM - ambulate as tolerated when she is able to  Will continue to follow, suspect she may need another 1-2 days in the hospital to  recover from this but appears to be getting better. Call with questions.  [place] Cellar, MD [facility]

## 2020-06-29 NOTE — Telephone Encounter (Signed)
Thank you for the update.  She has been evaluated Kim Hicks. GM

## 2020-06-29 NOTE — Progress Notes (Signed)
Initial Nutrition Assessment  DOCUMENTATION CODES:   Not applicable  INTERVENTION:  Ensure Max po daily, each supplement provides 150 kcal and 30 grams of protein  MVI with minerals daily  Education provided -Pancreatitis Nutrition Therapy handout attached to d/c instructions   NUTRITION DIAGNOSIS:   Inadequate oral intake related to poor appetite as evidenced by per patient/family report.    GOAL:   Patient will meet greater than or equal to 90% of their needs    MONITOR:   Labs,I & O's,Diet advancement,Supplement acceptance,PO intake,Weight trends,Skin  REASON FOR ASSESSMENT:   Malnutrition Screening Tool    ASSESSMENT:  55 year old female admitted for post ERCP acute pancreatitis presented with abdominal pain and chills. Past medical history of NASH liver cirrhosis, DM2, migraines, HLD, HTN, neuropathy, GERD, IBS, cholecystitis complicated by persistent bile leak s/p ERCP with stent x 4 on 2/02.  RD working remotely.  Spoke with pt via phone this afternoon, states she is hurting and uncomfortable. Reports eating some grits and toast for lunch and has ordered mac and cheese and ham sandwich for dinner. Pt says her appetie and intake has been "so so." Lately, she has mostly been drinking supplements, recalls 2 Equate protein shakes daily and likes the chocolate flavor. RD educated on trying to eat 4-6 small frequent meals, recommended avoiding foods that are high in fat and discussed foods that are easy to digest. "Pancreatitis Nutrition Therapy" handout from [employer] handout has been attached to discharge instructions.  Recalls usual weight ~205 lbs prior to surgery in March 2021. Per chart, pt weighed 73.5 kg (161.7 lbs) on 08/21/19, she weighed 75.6 kg on 09/14/19, pt weighed 75.3 kg (165.66 lbs) on 11/07/19, then increased to 81.6 kg (179.52 lbs) on 02/16/20. Currently pt weighs 72.6 kg (159.72 lbs). This indicates a 19.8 lb (11%) wt loss in the  last 4.5 months; significant.  I/Os: +2911 ml since admit UOP: 1750 ml x 24 hrs Drains: 100 ml x 24 hours  Medications reviewed and include: Rexulti, SSI, Lantus 15 units daily, Protonix, Lyrica, Senokot, [name], IV Cipro  IVF: NaCl @ 125 ml/hr  Labs: CBGs 230,109,72, Lipase 1052 (H)  NUTRITION - FOCUSED PHYSICAL EXAM: Unable to complete at this time   Diet Order:   Diet Order            DIET SOFT Room service appropriate? Yes; Fluid consistency: Thin  Diet effective now                 EDUCATION NEEDS:   Education needs have been addressed  Skin:  Skin Assessment: Reviewed RN Assessment  Last BM:  2/3  Height:   Ht Readings from Last 1 Encounters:  06/27/20 _0  (1.651 m)    Weight:   Wt Readings from Last 1 Encounters:  06/27/20 72.6 kg    BMI:  Body mass index is 26.63 kg/m.  Estimated Nutritional Needs:   Kcal:  2000-2200  Protein:  110-125  Fluid:  >2 L   Lajuan Lines, RD, LDN Clinical Nutrition After Hours/Weekend Pager # in [phone]

## 2020-06-29 NOTE — Discharge Instructions (Signed)
Pancreatitis Nutrition Therapy The pancreas is an organ that helps your body digest and absorb nutrients in food. When you have pancreatitis, your body may not be able to digest food well. The fat in food is especially hard for your body to digest and may cause pain. This nutrition therapy limits the fat in your diet while providing nutrients you need. Eat nonfat or low-fat foods that meet the [place] Dietary Guidelines. For most adults, goals should be: Grains: 5 to 6 servings (1 ounce) each day (1 ounce = 1 slice bread, 1 cup breakfast cereal, or  cup cooked pasta or rice). Vegetables: 2 to 3 cups each day. Eat a variety of vegetables, especially dark-green and red and orange vegetables and beans and peas. Fruits: 2 cups each day. Protein foods (meat, poultry, fish, and beans): 5 to 6 ounces each day (1 egg or  cup beans counts as 1 ounce) Milk and dairy foods: 3 cups each day (1 ounces cheese count as 1 cup milk) Keep the total amount of fat that you eat to 25% to 30% of the calories that you eat. If your recommended intake is to eat 2,000 calories per day, your fat intake can be 55 to 65 grams per day. Eating small, low fat meals and drinking enough fluids may improve your pain. Choosing soft foods can also help decrease discomfort from pancreatitis. It can be hard to maintain your weight on a low-fat diet. Let your doctor or registered dietitian nutritionist (RDN) know if you lose weight while on this diet. Do not drink alcoholic beverages. Talk with an RDN to figure out which foods are best for you and to get answers to any questions you might have about your nutrition therapy. Ask the RDN for recommendations for other conditions you have, such as diabetes mellitus. If prescribed, take pancreatic enzymes before each meal or as directed by your doctor. If pancreatic enzymes are prescribed, a low-fat diet may no longer be needed.  Pancreatitis Sample 1-Day Menu Breakfast 6 ounces  tomato juice 4-inch oat bran bagel 1 tablespoon nonfat cream cheese 1-1/2 ounces liquid egg substitute 1 cup fat-free milk Lunch 2 ounces [place] breast 2 leaves lettuce 2 slices tomato 2 teaspoons nonfat mayonnaise 1 teaspoon mustard 1 cup carrots 1/2 cup pineapple 1 cup fat-free milk 2 slices of wheat bread Afternoon Snack 1 cup blueberries 1 cup fat-free milk Evening Meal 3 ounces tilapia 2 slices polenta 1/2 cup sliced zucchini 1 whole wheat roll 1 teaspoon margarine 1/2 cup nonfat frozen yogurt 1/4 cup sliced strawberries Hot tea

## 2020-06-29 NOTE — Progress Notes (Signed)
PROGRESS NOTE    Kim Hicks  OZH:[id number] DOB: 30-Mar-1966 DOA: 06/27/2020 PCP: [name], FNP   Chief Complaint  Patient presents with  . Post-op Problem  . Abdominal Pain  Brief Narrative: 55 year old female with history of liver cirrhosis from NASH, type 2 diabetes, type 2 diabetes mellitus, migraine, hyperlipidemia, hypertension, neuropathy, GERD, IBS, cholecystitis complicated by persistent bile leak who had ERCP for infected biloma, had a stent placed x4  On 06/26/20 presented to the ED with severe abdominal pain, chills.  She was seen in the ED and found to have post ERCP acute pancreatitis-with CT scan showing percutaneous drainage catheter coiled in the gallbladder fossa without focal fluid collection, large colonic stool burden, cirrhotic hepatic morphology, new for biliary stents.  Lipase significantly elevated, GI was consulted placed on IV antibiotic and was admitted.  Subjective:  Afebrile overnight Labs pending. Had BM yesterday abd pain better today- wants to try po meds. Spoke with GI this morning. Assessment & Plan:  Post ERCP pancreatitis: Clinically improving, lipase downtrending 1800>1052, LFTs normal, CT scan no evidence of pancreatitis.  GI following closely, advance diet as tolerated today to low fiber diet, continue IV hydration continue pain management IV opiates, add oral oxycodone prn, monitor labs/LFTs lipase, continue empiric Cipro.   Diabetes mellitus type 2, insulin-dependent on Basaglar 30 units nightly, Trulicity 4 mg every week and Synjardy metformin.  Blood sugar is retable on current Lantus and sliding scale.  For now we will continue on sliding scale insulin monitor CBC closely.  Hemoglobin A1c poorly controlled at 8.9. Recent Labs  Lab 06/28/20 1736 06/28/20 2004 06/29/20 0011 06/29/20 0504 06/29/20 0741  GLUCAP 103* 93 629* 72 72   Metabolic acidosis: Bicarb 21.  Monitor.  Lactic acid is normal.  Monitor labs.  Anemia of chronic  disease hemoglobin stable in mid 9 g.   Recent Labs  Lab 06/27/20 1658 06/28/20 0446 06/29/20 0847  HGB 9.8* 9.3* 9.8*  HCT 33.5* 32.5* 34.1*   HTN/HLD: Well-controlled continue to hold home meds.  Continue to hold home statins.    GERD on PPI.    Chronic constipation: Noted in the CT scan laxative as tolerated diet, as tolerated  Liver cirrhosis due to NASH-GI following, LFTs normal.  Nutrition: Diet Order            Diet clear liquid Room service appropriate? Yes; Fluid consistency: Thin  Diet effective now                 Pt's Body mass index is 26.63 kg/m.  DVT prophylaxis: SCDs Start: 06/27/20 2058 Code Status:   Code Status: Full Code  Family Communication: plan of care discussed with patient at bedside. Patient self updating family.  Status is: Remains inpatient Patient will need at least 2 midnight stay for ongoing management of post ERCP pancreatitis severe abdominal pain and is needing IV opiates.  Dispo: The patient is from: Home              Anticipated d/c is to: Home              Anticipated d/c date is: once tolerating diet pain is controlled and okay GI likely 1-2 days              Patient currently is not medically stable to d/c.   Difficult to place patient No  Consultants:see note  Procedures:see note  Culture/Microbiology    Component Value Date/Time   SDES  05/23/2020 1557  ABSCESS GALLBLADDER FOSSA FLUID Performed at [facility], [place] [address], [place], [place] [address]    [place]  05/23/2020 1557    Normal Performed at [facility], [place] [address], [place], [place] [address]    CULT  05/23/2020 1557    RARE ENTEROCOCCUS FAECALIS NO ANAEROBES ISOLATED Performed at [facility], [place] [address], [place], [place] [address]    REPTSTATUS 05/28/2020 FINAL 05/23/2020 1557    Other culture-see note  Medications: Scheduled Meds: . brexpiprazole  2 mg Oral QHS  . insulin aspart  0-9  Units Subcutaneous Q4H  . insulin glargine  15 Units Subcutaneous QHS  . pantoprazole (PROTONIX) IV  40 mg Intravenous QHS  . pregabalin  100 mg Oral TID  . QUEtiapine  800 mg Oral QHS  . senna  1 tablet Oral BID  . topiramate  200 mg Oral BID  . vortioxetine HBr  20 mg Oral QHS  . ziprasidone  20 mg Oral QHS   Continuous Infusions: . sodium chloride 125 mL/hr at 06/28/20 1140  . ciprofloxacin 400 mg (06/29/20 0827)    Antimicrobials: Anti-infectives (From admission, onward)   Start     Dose/Rate Route Frequency Ordered Stop   06/27/20 2100  ciprofloxacin (CIPRO) IVPB 400 mg        400 mg 200 mL/hr over 60 Minutes Intravenous Every 12 hours 06/27/20 2057       Objective: Vitals: Today's Vitals   06/29/20 0506 06/29/20 0507 06/29/20 0533 06/29/20 1000  BP: 133/72     Pulse: 83     Resp: 16     Temp: (!) 97.5 F (36.4 C)     TempSrc: Oral     SpO2: 98%     Weight:      Height:      PainSc:  9  5  8      Intake/Output Summary (Last 24 hours) at 06/29/2020 1143 Last data filed at 06/29/2020 1000 Gross per 24 hour  Intake 3236.28 ml  Output 1435 ml  Net 1801.28 ml   Filed Weights   06/27/20 1552  Weight: 72.6 kg   Weight change:   Intake/Output from previous day: 02/04 0701 - 02/05 0700 In: 3652.5 [P.O.:240; I.V.:3012.5; IV Piggyback:400] Out: 1885 [Urine:1750; Drains:60; Stool:75] Intake/Output this shift: Total I/O In: 240 [P.O.:240] Out: 0  Filed Weights   06/27/20 1552  Weight: 72.6 kg    Examination: General exam: AAOx3, frail, , NAD, weak appearing. HEENT:Oral mucosa moist, Ear/Nose WNL grossly, dentition normal. Respiratory system: bilaterally clear,no wheezing or crackles,no use of accessory muscle Cardiovascular system: S1 & S2 +, No JVD,. Gastrointestinal system: Abdomen soft, mildly tender lower abdome,ND, BS+ Nervous System:Alert, awake, moving extremities and grossly nonfocal Extremities: No edema, distal peripheral pulses palpable.  Skin: No  rashes,no icterus. MSK: Normal muscle bulk,tone, power Data Reviewed: I have personally reviewed following labs and imaging studies CBC: Recent Labs  Lab 06/27/20 1658 06/28/20 0446 06/29/20 0847  WBC 6.2 6.8 5.2  NEUTROABS 3.7 4.2  --   HGB 9.8* 9.3* 9.8*  HCT 33.5* 32.5* 34.1*  MCV 83.3 84.2 84.8  PLT 161 144* 628*   Basic Metabolic Panel: Recent Labs  Lab 06/27/20 1658 06/28/20 0446 06/29/20 0847  NA 139 139 141  K 4.2 4.0 3.6  CL 109 112* 113*  CO2 19* 21* 21*  GLUCOSE 356* 186* 100*  BUN 25* 16 13  CREATININE 0.83 0.43* 0.43*  CALCIUM 8.7* 8.6* 8.6*  MG  --  1.8  --   PHOS  --  2.7  --    GFR: Estimated Creatinine Clearance: 79.3 mL/min (A) (by C-G formula based on SCr of 0.43 mg/dL (L)). Liver Function Tests: Recent Labs  Lab 06/27/20 1658 06/28/20 0446 06/29/20 0847  AST 44* 38 40  ALT 30 27 26   ALKPHOS 142* 117 119  BILITOT 0.4 0.5 0.5  PROT 7.7 6.7 6.6  ALBUMIN 3.9 3.5 3.4*   Recent Labs  Lab 06/27/20 1658 06/28/20 0446 06/29/20 0847  LIPASE 1,155* 1,838* 1,052*   No results for input(s): AMMONIA in the last 168 hours. Coagulation Profile: Recent Labs  Lab 06/27/20 1700  INR 1.0   Cardiac Enzymes: Recent Labs  Lab 06/28/20 0446  CKTOTAL 22*   BNP (last 3 results) No results for input(s): PROBNP in the last 8760 hours. HbA1C: Recent Labs    06/27/20 1700  HGBA1C 8.9*   CBG: Recent Labs  Lab 06/28/20 1736 06/28/20 2004 06/29/20 0011 06/29/20 0504 06/29/20 0741  GLUCAP 103* 93 100* 72 72   Lipid Profile: No results for input(s): CHOL, HDL, LDLCALC, TRIG, CHOLHDL, LDLDIRECT in the last 72 hours. Thyroid Function Tests: Recent Labs    06/28/20 0446  TSH 4.225   Anemia Panel: No results for input(s): VITAMINB12, FOLATE, FERRITIN, TIBC, IRON, RETICCTPCT in the last 72 hours. Sepsis Labs: Recent Labs  Lab 06/28/20 0446  LATICACIDVEN 0.7    Recent Results (from the past 240 hour(s))  SARS CORONAVIRUS 2 (TAT 6-24  HRS) Nasopharyngeal Nasopharyngeal Swab     Status: None   Collection Time: 06/22/20  1:27 PM   Specimen: Nasopharyngeal Swab  Result Value Ref Range Status   SARS Coronavirus 2 NEGATIVE NEGATIVE Final    Comment: (NOTE) SARS-CoV-2 target nucleic acids are NOT DETECTED.  The SARS-CoV-2 RNA is generally detectable in upper and lower respiratory specimens during the acute phase of infection. Negative results do not preclude SARS-CoV-2 infection, do not rule out co-infections with other pathogens, and should not be used as the sole basis for treatment or other patient management decisions. Negative results must be combined with clinical observations, patient history, and epidemiological information. The expected result is Negative.  Fact Sheet for Patients: SugarRoll.be  Fact Sheet for Healthcare Providers: https://www.woods-mathews.com/  This test is not yet approved or cleared by the [place] FDA and  has been authorized for detection and/or diagnosis of SARS-CoV-2 by FDA under an Emergency Use Authorization (EUA). This EUA will remain  in effect (meaning this test can be used) for the duration of the COVID-19 declaration under Se ction 564(b)(1) of the Act, 21 U.S.C. section 360bbb-3(b)(1), unless the authorization is terminated or revoked sooner.  Performed at [facility], [place] [address] [place], [place] [address]   SARS CORONAVIRUS 2 (TAT 6-24 HRS) Nasopharyngeal Nasopharyngeal Swab     Status: None   Collection Time: 06/27/20  8:37 PM   Specimen: Nasopharyngeal Swab  Result Value Ref Range Status   SARS Coronavirus 2 NEGATIVE NEGATIVE Final    Comment: (NOTE) SARS-CoV-2 target nucleic acids are NOT DETECTED.  The SARS-CoV-2 RNA is generally detectable in upper and lower respiratory specimens during the acute phase of infection. Negative results do not preclude SARS-CoV-2 infection, do not rule out co-infections with  other pathogens, and should not be used as the sole basis for treatment or other patient management decisions. Negative results must be combined with clinical observations, patient history, and epidemiological information. The expected result is Negative.  Fact Sheet for Patients: SugarRoll.be  Fact Sheet for Healthcare Providers: https://www.woods-mathews.com/  This test is not yet approved or cleared by the [place] FDA and  has been authorized for detection and/or diagnosis of SARS-CoV-2 by FDA under an Emergency Use Authorization (EUA). This EUA will remain  in effect (meaning this test can be used) for the duration of the COVID-19 declaration under Se ction 564(b)(1) of the Act, 21 U.S.C. section 360bbb-3(b)(1), unless the authorization is terminated or revoked sooner.  Performed at [facility], [place] [address], [place], [place] [address]      Radiology Studies: CT ABDOMEN PELVIS W CONTRAST  Result Date: 06/27/2020 CLINICAL DATA:  Right lower quadrant pain. ERCP with biliary stent exchange yesterday. EXAM: CT ABDOMEN AND PELVIS WITH CONTRAST TECHNIQUE: Multidetector CT imaging of the abdomen and pelvis was performed using the standard protocol following bolus administration of intravenous contrast. CONTRAST:  165m OMNIPAQUE IOHEXOL 300 MG/ML  SOLN COMPARISON:  CT 06/07/2020 FINDINGS: Lower chest: No pleural fluid or focal airspace disease. Hepatobiliary: Percutaneous drainage catheter coiled in the gallbladder fossa. No focal fluid collection in the cholecystectomy bed. There is a punctate focus of air at the porta hepatis which is likely related to recent drain interrogation. There are 4 biliary stents extending from the porta hepatis to the duodenum. Common bile duct is decompressed by biliary stents. There is no intrahepatic biliary ductal dilatation. Cirrhotic hepatic morphology with nodular contours again seen. No intrahepatic  fluid collection or focal lesion. Patent portal vein. Pancreas: No peripancreatic fat stranding or evidence pancreatitis post ERCP. No pancreatic ductal dilatation. Spleen: Splenomegaly spanning 14.3 cm cranial caudal, unchanged. No focal splenic abnormality. Adrenals/Urinary Tract: Normal adrenal glands. No hydronephrosis or perinephric edema. Homogeneous renal enhancement with symmetric excretion on delayed phase imaging. Urinary bladder is physiologically distended without wall thickening. Stomach/Bowel: Fluid distended stomach. No gastric wall thickening. No duodenal wall thickening. Proximal small bowel are decompressed. Distal small bowel mildly fluid-filled and prominent. There is fecalization of distal small bowel contents. No terminal ileal inflammation. Normal appendix. Large colonic stool with transverse colonic tortuosity. No colonic wall thickening or pericolonic edema. There is stool in the rectum. Vascular/Lymphatic: Normal caliber abdominal aorta. Patent portal and splenic veins. No mesenteric gas or thrombosis. No abdominopelvic adenopathy. Reproductive: Hysterectomy. Ovaries not visualized. No adnexal mass. Presumed tubal ligation clips. Other: No ascites or free fluid. Tiny focus of air in the porta hepatis is likely related to recent drain manipulation. No pneumoperitoneum. No focal fluid collection in the abdomen or pelvis. Musculoskeletal: There are no acute or suspicious osseous abnormalities. IMPRESSION: 1. Percutaneous drainage catheter coiled in the gallbladder fossa without focal fluid collection. 2. New 4 biliary stents extending from the porta hepatis to the duodenum. Common bile duct is decompressed by biliary stents. No intrahepatic biliary ductal dilatation. 3. No other acute findings. 4. Cirrhotic hepatic morphology with splenomegaly. 5. Large colonic stool burden with transverse colonic tortuosity, suggesting constipation. Distal small bowel mildly fluid-filled and prominent, with  fecalization of distal small bowel contents, suggesting slow transit. Electronically Signed   By: MKeith RakeM.D.   On: 06/27/2020 18:39     LOS: 1 day   RAntonieta Pert MD [facility]  06/29/2020, 11:43 AM

## 2020-06-30 DIAGNOSIS — R1084 Generalized abdominal pain: Secondary | ICD-10-CM | POA: Diagnosis not present

## 2020-06-30 DIAGNOSIS — K859 Acute pancreatitis without necrosis or infection, unspecified: Secondary | ICD-10-CM | POA: Diagnosis not present

## 2020-06-30 DIAGNOSIS — Z794 Long term (current) use of insulin: Secondary | ICD-10-CM | POA: Diagnosis not present

## 2020-06-30 DIAGNOSIS — K839 Disease of biliary tract, unspecified: Secondary | ICD-10-CM | POA: Diagnosis not present

## 2020-06-30 DIAGNOSIS — K9189 Other postprocedural complications and disorders of digestive system: Secondary | ICD-10-CM | POA: Diagnosis not present

## 2020-06-30 DIAGNOSIS — E119 Type 2 diabetes mellitus without complications: Secondary | ICD-10-CM | POA: Diagnosis not present

## 2020-06-30 LAB — GLUCOSE, CAPILLARY
Glucose-Capillary: 116 mg/dL — ABNORMAL HIGH (ref 70–99)
Glucose-Capillary: 116 mg/dL — ABNORMAL HIGH (ref 70–99)
Glucose-Capillary: 131 mg/dL — ABNORMAL HIGH (ref 70–99)
Glucose-Capillary: 135 mg/dL — ABNORMAL HIGH (ref 70–99)
Glucose-Capillary: 172 mg/dL — ABNORMAL HIGH (ref 70–99)

## 2020-06-30 LAB — CBC
HCT: 31.8 % — ABNORMAL LOW (ref 36.0–46.0)
Hemoglobin: 9.4 g/dL — ABNORMAL LOW (ref 12.0–15.0)
MCH: 24.7 pg — ABNORMAL LOW (ref 26.0–34.0)
MCHC: 29.6 g/dL — ABNORMAL LOW (ref 30.0–36.0)
MCV: 83.5 fL (ref 80.0–100.0)
Platelets: 142 10*3/uL — ABNORMAL LOW (ref 150–400)
RBC: 3.81 MIL/uL — ABNORMAL LOW (ref 3.87–5.11)
RDW: 16 % — ABNORMAL HIGH (ref 11.5–15.5)
WBC: 5.1 10*3/uL (ref 4.0–10.5)
nRBC: 0 % (ref 0.0–0.2)

## 2020-06-30 LAB — LIPASE, BLOOD: Lipase: 294 U/L — ABNORMAL HIGH (ref 11–51)

## 2020-06-30 LAB — COMPREHENSIVE METABOLIC PANEL
ALT: 24 U/L (ref 0–44)
AST: 39 U/L (ref 15–41)
Albumin: 3.4 g/dL — ABNORMAL LOW (ref 3.5–5.0)
Alkaline Phosphatase: 118 U/L (ref 38–126)
Anion gap: 7 (ref 5–15)
BUN: 11 mg/dL (ref 6–20)
CO2: 24 mmol/L (ref 22–32)
Calcium: 8.7 mg/dL — ABNORMAL LOW (ref 8.9–10.3)
Chloride: 111 mmol/L (ref 98–111)
Creatinine, Ser: 0.52 mg/dL (ref 0.44–1.00)
GFR, Estimated: >60 mL/min (ref >60)
Glucose, Bld: 128 mg/dL — ABNORMAL HIGH (ref 70–99)
Potassium: 4 mmol/L (ref 3.5–5.1)
Sodium: 142 mmol/L (ref 135–145)
Total Bilirubin: 0.6 mg/dL (ref 0.3–1.2)
Total Protein: 6.4 g/dL — ABNORMAL LOW (ref 6.5–8.1)

## 2020-06-30 MED ORDER — PANTOPRAZOLE SODIUM 40 MG PO TBEC
40.0000 mg | DELAYED_RELEASE_TABLET | Freq: Every day | ORAL | Status: DC
  Administered 2020-06-30 – 2020-07-03 (×4): 40 mg via ORAL
  Filled 2020-06-30 (×4): qty 1

## 2020-06-30 MED ORDER — ENOXAPARIN SODIUM 40 MG/0.4ML ~~LOC~~ SOLN
40.0000 mg | Freq: Every day | SUBCUTANEOUS | Status: DC
  Administered 2020-06-30 – 2020-07-13 (×14): 40 mg via SUBCUTANEOUS
  Filled 2020-06-30 (×13): qty 0.4

## 2020-06-30 NOTE — Progress Notes (Signed)
PHARMACIST - PHYSICIAN COMMUNICATION  DR:   [place] Pink  CONCERNING: IV to Oral Route Change Policy  RECOMMENDATION: This patient is receiving pantoprazole by the intravenous route.  Based on criteria approved by the Pharmacy and Therapeutics Committee, the intravenous medication(s) is/are being converted to the equivalent oral dose form(s).   DESCRIPTION: These criteria include:  The patient is eating (either orally or via tube) and/or has been taking other orally administered medications for a least 24 hours  The patient has no evidence of active gastrointestinal bleeding or impaired GI absorption (gastrectomy, short bowel, patient on TNA or NPO).  If you have questions about this conversion, please contact the Pharmacy Department  []   [phone] )  [facility] []   [phone] )  [facility] []   [phone] )  [facility] []   [phone] )  [facility] [x]   [phone] )  [place], [facility] 06/30/2020 6:44 AM

## 2020-06-30 NOTE — Progress Notes (Signed)
Gastroenterology Inpatient Follow-up Note   PATIENT IDENTIFICATION  Kim Hicks is a 55 y.o. female with a pmh significant for cirrhosis (manifested with portal hypertension), diabetes, hypertension, hyperlipidemia, anxiety/MDD, status post cholecystectomy with persistent bile leak (for almost a year) status post multiple ERCPs with recent ERCP last week.  Admitted with pancreatitis. Hospital Day: 4  SUBJECTIVE  Patient continues to have abdominal pain.  She states she is approximately 30 to 35% better than when she came in but continues to have significant pain and discomfort.  The patient describes having greater than 600 cc of bile overnight although the total recorded for yesterday was actually only 318 cc.  The patient denies any fevers or chills.  She has a minimal appetite and even though she was advancing her diet yesterday she did not eat dinner.  She denies any pruritus.   OBJECTIVE  Scheduled Inpatient Medications:  . brexpiprazole  2 mg Oral QHS  . insulin aspart  0-9 Units Subcutaneous Q4H  . insulin glargine  15 Units Subcutaneous QHS  . pantoprazole  40 mg Oral QHS  . pregabalin  100 mg Oral TID  . QUEtiapine  800 mg Oral QHS  . senna  1 tablet Oral BID  . topiramate  200 mg Oral BID  . vortioxetine HBr  20 mg Oral QHS  . ziprasidone  20 mg Oral QHS   Continuous Inpatient Infusions:  . sodium chloride 125 mL/hr at 06/29/20 1950  . ciprofloxacin 400 mg (06/29/20 2218)   PRN Inpatient Medications: acetaminophen **OR** acetaminophen, bisacodyl, HYDROmorphone (DILAUDID) injection, ondansetron **OR** ondansetron (ZOFRAN) IV, oxyCODONE   Physical Examination  Temp:  [98.1 F (36.7 C)] 98.1 F (36.7 C) (02/06 0603) Pulse Rate:  [79-89] 87 (02/06 0603) Resp:  [17-18] 18 (02/06 0603) BP: (120-135)/(65-77) 123/77 (02/06 0603) SpO2:  [96 %-99 %] 99 % (02/06 0603) Temp (24hrs), Avg:98.1 F (36.7 C), Min:98.1 F (36.7 C), Max:98.1 F (36.7 C)  Weight: 72.6 kg GEN:  Chronically ill-appearing but in no acute distress resting in bed  PSYCH: Cooperative, without pressured speech EYE: Conjunctivae pink, sclerae anicteric ENT: MMM CV: Nontachycardic RESP: No audible wheezing GI: Hypoactive though present bowel sounds, tenderness to palpation generalized in the midepigastrium and upper abdomen with some radiation to the back, protuberant abdomen, volitional guarding is present, no rebound  MSK/EXT: Lower extremity edema present SKIN: No jaundice NEURO:  Alert & Oriented x 3, no focal deficits, no evidence of asterixis   Review of Data   Laboratory Studies   Recent Labs  Lab 06/28/20 0446 06/29/20 0847 06/30/20 0550  NA 139   < > 142  K 4.0   < > 4.0  CL 112*   < > 111  CO2 21*   < > 24  BUN 16   < > 11  CREATININE 0.43*   < > 0.52  GLUCOSE 186*   < > 128*  CALCIUM 8.6*   < > 8.7*  MG 1.8  --   --   PHOS 2.7  --   --    < > = values in this interval not displayed.   Recent Labs  Lab 06/30/20 0550  AST 39  ALT 24  ALKPHOS 118    Recent Labs  Lab 06/28/20 0446 06/29/20 0847 06/30/20 0550  WBC 6.8 5.2 5.1  HGB 9.3* 9.8* 9.4*  HCT 32.5* 34.1* 31.8*  PLT 144* 132* 142*   Recent Labs  Lab 06/27/20 1700  INR 1.0   MELD-Na  score: 6 at 06/29/2020  8:47 AM MELD score: 6 at 06/29/2020  8:47 AM Calculated from: Serum Creatinine: 0.43 mg/dL (Using min of 1 mg/dL) at 06/29/2020  8:47 AM Serum Sodium: 141 mmol/L (Using max of 137 mmol/L) at 06/29/2020  8:47 AM Total Bilirubin: 0.5 mg/dL (Using min of 1 mg/dL) at 06/29/2020  8:47 AM INR(ratio): 1.0 at 06/27/2020  5:00 PM Age: 59 years  Imaging Studies  No results found.  GI Procedures and Studies  No new studies to review   ASSESSMENT  Kim Hicks is a 55 y.o. female with a pmh significant for cirrhosis (manifested with portal hypertension), diabetes, hypertension, hyperlipidemia, anxiety/MDD, status post cholecystectomy with persistent bile leak (for almost a year) status post multiple ERCPs  with recent ERCP last week.  Admitted with pancreatitis.  The patient is dealing with post ERCP pancreatitis most likely a result of the multiple stents that are within the biliary tree likely causing some irritation to the pancreatic duct although imaging did not suggest overt pancreatitis and there was no duct dilation.  Lipase had been downtrending yesterday.  I have asked the patient to do her best in regards to trying to eat what she can today.  If however she does not get adequate nutrition in the course of the coming 24 hours then she needs to have a core track placed tomorrow for optimal nutrition in 8 of healing her.  Patient during last hospitalization post ERCP had significant pain requiring a nearly 10-day course of hospital medications.  We will need to monitor her closely but again nutrition will be key.  Hopefully we will not have to place a core track but we will see.  My hope is that the drainage output continues to decrease and from my standpoint her over description of greater than 600 cc is concerning that she does not actually measure the amount that is coming out but hopefully we see things improve as the pancreatitis lessens.  If she has the development of fevers or chills we will consider repeat cross-sectional imaging.  She may continue antibiotics for total of 5 days since her ERCP should be enough for prophylaxis and hopefully decrease chance of getting any complications from too much antibiotic use.  The inpatient GI team will follow up tomorrow.   PLAN/RECOMMENDATIONS  Continue low-fat diet but if patient is not improving by tomorrow she will need cortrack placement for nutritional support Pain medications as per primary service Ciprofloxacin should course out for 5 days total to end on 07/01/2020 (since ERCP) Daily weight is helpful if I's and O's are not being followed for urine output If patient is not able to tolerate good intake from a low-fat diet perspective then she needs  to be restarted on maintenance fluids Monitor I/os from biliary percutaneous drain and this amount should be replaced at least daily with IV fluids or oral intake to minimize dehydration   Please page/call with questions or concerns.   Kim Britain, MD [facility] Office # [phone]    LOS: 2 days  [facility], [place]  06/30/2020, 8:06 AM

## 2020-06-30 NOTE — Progress Notes (Signed)
PROGRESS NOTE   Kim Hicks  ZCH:[id number] DOB: 12/26/1965 DOA: 06/27/2020  PCP: [name], FNP    Brief Narrative: 55 year old female with history of liver cirrhosis from NASH, type 2 diabetes, type 2 diabetes mellitus, migraine, hyperlipidemia, hypertension, neuropathy, GERD, IBS, cholecystitis complicated by persistent bile leak who had ERCP for infected biloma, had stent placed x4 on 06/26/20.  She presented with complains of abdominal pain and chills.  Was found to have acute pancreatitis.  Was hospitalized for further management.  Gastroenterology was consulted.   Subjective: Patient continues to have abdominal pain in the upper abdomen.  Currently 7 out of 10 in intensity.  No radiation.  Denies any nausea vomiting.  Appetite remains poor.  Passing gas from below.  Assessment & Plan:  Post ERCP pancreatitis Patient has significantly elevated lipase level of 1800.  Has been trending down.  Abdomen remains benign.  She however is tender to palpation.  Seems to be slowly getting better.  Gastroenterology is following.  Continue IV fluids.  Supportive care.  Due to concern for biliary process with recent stent placement patient remains empirically on ciprofloxacin.    Diabetes mellitus type 2, insulin-dependent At home she has on Basaglar 30 units nightly, Trulicity 4 mg every week and Synjardy, metformin.   Monitor CBGs.  HbA1c 8.9.  Continue SSI.  On Lantus as well.    Normal anion gap metabolic acidosis Seems to have resolved.  Anemia of chronic disease Hemoglobin low but stable.  No evidence of overt blood loss.   Essential hypertension/hyperlipidemia Monitor blood pressures closely.  Antihypertensives currently on hold.  GERD  On PPI.    Chronic constipation Laxative   Liver cirrhosis due to NASH Outpatient monitoring per GI.   DVT prophylaxis: Initiate Lovenox  Code Status: Full code Family Communication: Discussed with patient.  No family at  bedside Disposition: Hopefully return home when improved  Status is: Remains inpatient Patient will need at least 2 midnight stay for ongoing management of post ERCP pancreatitis severe abdominal pain and is needing IV opiates.  Dispo: The patient is from: Home              Anticipated d/c is to: Home              Anticipated d/c date is: once tolerating diet pain is controlled and okay GI likely 1-2 days              Patient currently is not medically stable to d/c.   Difficult to place patient No  Consultants: [facility] Procedures: None yet  Culture/Microbiology    Component Value Date/Time   SDES  05/23/2020 1557    ABSCESS GALLBLADDER FOSSA FLUID Performed at [facility], [place] [address], [place], [place] [address]    [place]  05/23/2020 1557    Normal Performed at [facility], [place] [address] [place], [place] [address]    CULT  05/23/2020 1557    RARE ENTEROCOCCUS FAECALIS NO ANAEROBES ISOLATED Performed at [facility], [place] [address] [place], [place] [address]    REPTSTATUS 05/28/2020 FINAL 05/23/2020 1557    Other culture-see note  Medications: Scheduled Meds: . brexpiprazole  2 mg Oral QHS  . insulin aspart  0-9 Units Subcutaneous Q4H  . insulin glargine  15 Units Subcutaneous QHS  . pantoprazole  40 mg Oral QHS  . pregabalin  100 mg Oral TID  . QUEtiapine  800 mg Oral QHS  . senna  1 tablet Oral BID  . topiramate  200 mg Oral BID  . vortioxetine HBr  20 mg Oral QHS  . ziprasidone  20 mg Oral QHS   Continuous Infusions: . sodium chloride 125 mL/hr at 06/29/20 1950  . ciprofloxacin 400 mg (06/30/20 0824)    Antimicrobials: Anti-infectives (From admission, onward)   Start     Dose/Rate Route Frequency Ordered Stop   06/27/20 2100  ciprofloxacin (CIPRO) IVPB 400 mg        400 mg 200 mL/hr over 60 Minutes Intravenous Every 12 hours 06/27/20 2057       Objective: Vitals: Today's Vitals    06/29/20 2327 06/30/20 0603 06/30/20 0827 06/30/20 0925  BP:  123/77    Pulse:  87    Resp:  18    Temp:  98.1 F (36.7 C)    TempSrc:      SpO2:  99%    Weight:      Height:      PainSc: 6   8  3      Intake/Output Summary (Last 24 hours) at 06/30/2020 1309 Last data filed at 06/30/2020 0931 Gross per 24 hour  Intake 4494.27 ml  Output 4968 ml  Net -473.73 ml   Filed Weights   06/27/20 1552  Weight: 72.6 kg   Weight change:   Intake/Output from previous day: 02/05 0701 - 02/06 0700 In: 3872 [P.O.:720; I.V.:2752; IV Piggyback:400] Out: 3268 [Urine:2950; Drains:318] Intake/Output this shift: Total I/O In: 862.2 [P.O.:360; I.V.:302.2; IV Piggyback:200] Out: 1700 [Urine:1700] Filed Weights   06/27/20 1552  Weight: 72.6 kg    Examination: General appearance: Awake alert.  In no distress Resp: Clear to auscultation bilaterally.  Normal effort Cardio: S1-S2 is normal regular.  No S3-S4.  No rubs murmurs or bruit GI: Abdomen is soft.  Tender in the epigastric area without any rebound or guarding.  No masses organomegaly.  Bowel sounds present. Extremities: No edema.  Full range of motion of lower extremities. Neurologic: Alert and oriented x3.  No focal neurological deficits.     Data Reviewed: I have personally reviewed following labs and imaging studies CBC: Recent Labs  Lab 06/27/20 1658 06/28/20 0446 06/29/20 0847 06/30/20 0550  WBC 6.2 6.8 5.2 5.1  NEUTROABS 3.7 4.2  --   --   HGB 9.8* 9.3* 9.8* 9.4*  HCT 33.5* 32.5* 34.1* 31.8*  MCV 83.3 84.2 84.8 83.5  PLT 161 144* 132* 287*   Basic Metabolic Panel: Recent Labs  Lab 06/27/20 1658 06/28/20 0446 06/29/20 0847 06/30/20 0550  NA 139 139 141 142  K 4.2 4.0 3.6 4.0  CL 109 112* 113* 111  CO2 19* 21* 21* 24  GLUCOSE 356* 186* 100* 128*  BUN 25* 16 13 11   CREATININE 0.83 0.43* 0.43* 0.52  CALCIUM 8.7* 8.6* 8.6* 8.7*  MG  --  1.8  --   --   PHOS  --  2.7  --   --    GFR: Estimated Creatinine  Clearance: 79.3 mL/min (by C-G formula based on SCr of 0.52 mg/dL). Liver Function Tests: Recent Labs  Lab 06/27/20 1658 06/28/20 0446 06/29/20 0847 06/30/20 0550  AST 44* 38 40 39  ALT 30 27 26 24   ALKPHOS 142* 117 119 118  BILITOT 0.4 0.5 0.5 0.6  PROT 7.7 6.7 6.6 6.4*  ALBUMIN 3.9 3.5 3.4* 3.4*   Recent Labs  Lab 06/27/20 1658 06/28/20 0446 06/29/20 0847 06/30/20 0550  LIPASE 1,155* 1,838* 1,052* 294*   Coagulation  Profile: Recent Labs  Lab 06/27/20 1700  INR 1.0   Cardiac Enzymes: Recent Labs  Lab 06/28/20 0446  CKTOTAL 22*   HbA1C: Recent Labs    06/27/20 1700  HGBA1C 8.9*   CBG: Recent Labs  Lab 06/29/20 1952 06/29/20 2357 06/30/20 0400 06/30/20 0734 06/30/20 1212  GLUCAP 142* 129* 116* 116* 131*   Thyroid Function Tests: Recent Labs    06/28/20 0446  TSH 4.225   Sepsis Labs: Recent Labs  Lab 06/28/20 0446  LATICACIDVEN 0.7    Recent Results (from the past 240 hour(s))  SARS CORONAVIRUS 2 (TAT 6-24 HRS) Nasopharyngeal Nasopharyngeal Swab     Status: None   Collection Time: 06/22/20  1:27 PM   Specimen: Nasopharyngeal Swab  Result Value Ref Range Status   SARS Coronavirus 2 NEGATIVE NEGATIVE Final    Comment: (NOTE) SARS-CoV-2 target nucleic acids are NOT DETECTED.  The SARS-CoV-2 RNA is generally detectable in upper and lower respiratory specimens during the acute phase of infection. Negative results do not preclude SARS-CoV-2 infection, do not rule out co-infections with other pathogens, and should not be used as the sole basis for treatment or other patient management decisions. Negative results must be combined with clinical observations, patient history, and epidemiological information. The expected result is Negative.  Fact Sheet for Patients: SugarRoll.be  Fact Sheet for Healthcare Providers: https://www.woods-mathews.com/  This test is not yet approved or cleared by the [place] FDA and  has been authorized for detection and/or diagnosis of SARS-CoV-2 by FDA under an Emergency Use Authorization (EUA). This EUA will remain  in effect (meaning this test can be used) for the duration of the COVID-19 declaration under Se ction 564(b)(1) of the Act, 21 U.S.C. section 360bbb-3(b)(1), unless the authorization is terminated or revoked sooner.  Performed at [facility], [place] [address] [place], [place] [address]   SARS CORONAVIRUS 2 (TAT 6-24 HRS) Nasopharyngeal Nasopharyngeal Swab     Status: None   Collection Time: 06/27/20  8:37 PM   Specimen: Nasopharyngeal Swab  Result Value Ref Range Status   SARS Coronavirus 2 NEGATIVE NEGATIVE Final    Comment: (NOTE) SARS-CoV-2 target nucleic acids are NOT DETECTED.  The SARS-CoV-2 RNA is generally detectable in upper and lower respiratory specimens during the acute phase of infection. Negative results do not preclude SARS-CoV-2 infection, do not rule out co-infections with other pathogens, and should not be used as the sole basis for treatment or other patient management decisions. Negative results must be combined with clinical observations, patient history, and epidemiological information. The expected result is Negative.  Fact Sheet for Patients: SugarRoll.be  Fact Sheet for Healthcare Providers: https://www.woods-mathews.com/  This test is not yet approved or cleared by the [place] FDA and  has been authorized for detection and/or diagnosis of SARS-CoV-2 by FDA under an Emergency Use Authorization (EUA). This EUA will remain  in effect (meaning this test can be used) for the duration of the COVID-19 declaration under Se ction 564(b)(1) of the Act, 21 U.S.C. section 360bbb-3(b)(1), unless the authorization is terminated or revoked sooner.  Performed at [facility], [place] [address] [place], [place] [address]      Radiology Studies: No  results found.   LOS: 2 days   Bonnielee Haff, MD [facility]  06/30/2020, 1:09 PM

## 2020-07-01 DIAGNOSIS — Z794 Long term (current) use of insulin: Secondary | ICD-10-CM | POA: Diagnosis not present

## 2020-07-01 DIAGNOSIS — R1084 Generalized abdominal pain: Secondary | ICD-10-CM | POA: Diagnosis not present

## 2020-07-01 DIAGNOSIS — K9189 Other postprocedural complications and disorders of digestive system: Secondary | ICD-10-CM | POA: Diagnosis not present

## 2020-07-01 DIAGNOSIS — E119 Type 2 diabetes mellitus without complications: Secondary | ICD-10-CM | POA: Diagnosis not present

## 2020-07-01 DIAGNOSIS — K839 Disease of biliary tract, unspecified: Secondary | ICD-10-CM | POA: Diagnosis not present

## 2020-07-01 DIAGNOSIS — K859 Acute pancreatitis without necrosis or infection, unspecified: Secondary | ICD-10-CM | POA: Diagnosis not present

## 2020-07-01 LAB — CBC
HCT: 35.8 % — ABNORMAL LOW (ref 36.0–46.0)
Hemoglobin: 10.3 g/dL — ABNORMAL LOW (ref 12.0–15.0)
MCH: 24.3 pg — ABNORMAL LOW (ref 26.0–34.0)
MCHC: 28.8 g/dL — ABNORMAL LOW (ref 30.0–36.0)
MCV: 84.4 fL (ref 80.0–100.0)
Platelets: 149 10*3/uL — ABNORMAL LOW (ref 150–400)
RBC: 4.24 MIL/uL (ref 3.87–5.11)
RDW: 16.2 % — ABNORMAL HIGH (ref 11.5–15.5)
WBC: 4.4 10*3/uL (ref 4.0–10.5)
nRBC: 0 % (ref 0.0–0.2)

## 2020-07-01 LAB — GLUCOSE, CAPILLARY
Glucose-Capillary: 103 mg/dL — ABNORMAL HIGH (ref 70–99)
Glucose-Capillary: 119 mg/dL — ABNORMAL HIGH (ref 70–99)
Glucose-Capillary: 147 mg/dL — ABNORMAL HIGH (ref 70–99)
Glucose-Capillary: 168 mg/dL — ABNORMAL HIGH (ref 70–99)
Glucose-Capillary: 90 mg/dL (ref 70–99)
Glucose-Capillary: 92 mg/dL (ref 70–99)
Glucose-Capillary: 96 mg/dL (ref 70–99)

## 2020-07-01 LAB — COMPREHENSIVE METABOLIC PANEL
ALT: 31 U/L (ref 0–44)
AST: 49 U/L — ABNORMAL HIGH (ref 15–41)
Albumin: 3.7 g/dL (ref 3.5–5.0)
Alkaline Phosphatase: 128 U/L — ABNORMAL HIGH (ref 38–126)
Anion gap: 9 (ref 5–15)
BUN: 9 mg/dL (ref 6–20)
CO2: 22 mmol/L (ref 22–32)
Calcium: 8.9 mg/dL (ref 8.9–10.3)
Chloride: 109 mmol/L (ref 98–111)
Creatinine, Ser: 0.43 mg/dL — ABNORMAL LOW (ref 0.44–1.00)
GFR, Estimated: >60 mL/min (ref >60)
Glucose, Bld: 116 mg/dL — ABNORMAL HIGH (ref 70–99)
Potassium: 3.5 mmol/L (ref 3.5–5.1)
Sodium: 140 mmol/L (ref 135–145)
Total Bilirubin: 0.5 mg/dL (ref 0.3–1.2)
Total Protein: 7.2 g/dL (ref 6.5–8.1)

## 2020-07-01 LAB — MAGNESIUM: Magnesium: 1.7 mg/dL (ref 1.7–2.4)

## 2020-07-01 LAB — LIPASE, BLOOD: Lipase: 59 U/L — ABNORMAL HIGH (ref 11–51)

## 2020-07-01 MED ORDER — MAGNESIUM SULFATE 2 GM/50ML IV SOLN
2.0000 g | Freq: Once | INTRAVENOUS | Status: AC
  Administered 2020-07-01: 2 g via INTRAVENOUS
  Filled 2020-07-01: qty 50

## 2020-07-01 MED ORDER — ONDANSETRON HCL 4 MG/2ML IJ SOLN
4.0000 mg | Freq: Four times a day (QID) | INTRAMUSCULAR | Status: AC
  Administered 2020-07-01 – 2020-07-02 (×4): 4 mg via INTRAVENOUS
  Filled 2020-07-01 (×4): qty 2

## 2020-07-01 MED ORDER — INSULIN GLARGINE 100 UNIT/ML ~~LOC~~ SOLN
5.0000 [IU] | Freq: Every day | SUBCUTANEOUS | Status: DC
  Administered 2020-07-01: 5 [IU] via SUBCUTANEOUS
  Filled 2020-07-01: qty 0.05

## 2020-07-01 MED ORDER — POTASSIUM CHLORIDE CRYS ER 20 MEQ PO TBCR
40.0000 meq | EXTENDED_RELEASE_TABLET | Freq: Once | ORAL | Status: AC
  Administered 2020-07-01: 40 meq via ORAL
  Filled 2020-07-01: qty 2

## 2020-07-01 NOTE — Progress Notes (Addendum)
[facility] Progress Note  CC:  Post ERCP pancreatitis, persistent biliary leak s/p cholecystectomy 07/2019  Subjective: She feels worse today. She vomited bilious emesis x 2 yesterday. Dry heaves this am. She is having worse generalized abdominal pain today, rates pain 8 on scale of 1 to 10. Yesterday, abdominal pain was a 6. She passed a small formed brown BM yesterday. No rectal bleeding or melena. She is willing to have a cortrack feeding tube placed if necessary. Per drain output 450cc past 24 hours.   Objective:   CTAP 06/27/2020:  1. Percutaneous drainage catheter coiled in the gallbladder fossa without focal fluid collection. 2. New 4 biliary stents extending from the porta hepatis to the duodenum. Common bile duct is decompressed by biliary stents. No intrahepatic biliary ductal dilatation. 3. No other acute findings. 4. Cirrhotic hepatic morphology with splenomegaly. 5. Large colonic stool burden with transverse colonic tortuosity, suggesting constipation. Distal small bowel mildly fluid-filled and prominent, with fecalization of distal small bowel contents, suggesting slow transit  ERCP 2/02/20221: - No gross lesions in esophagus. Z-line regular, 40 cm from the incisors. - Gastritis. No other gross lesions in the stomach. Biopsied. - No gross lesions in the duodenal bulb, in the first portion of the duodenum and in the second portion of the duodenum. - Prior biliary sphincterotomy appeared open. - One partially occluded migrated stent from the biliary tree was seen in the major papilla. This was removed. - The entire main bile duct was moderately dilated. - The biliary tree was swept and sludge was found. - Biliary extravasation was noted from cystic duct into the percutaneous drain. - Four plastic biliary stents were placed into the common bile duct.  Vital signs in last 24 hours: Temp:  [97.4 F (36.3 C)-98.2 F (36.8 C)] 97.4 F (36.3 C) (02/07  0359) Pulse Rate:  [80-85] 85 (02/07 0359) Resp:  [16-18] 16 (02/07 0359) BP: (126-150)/(69-84) 150/84 (02/07 0359) SpO2:  [99 %-100 %] 100 % (02/07 0359) Last BM Date: 06/29/20 General:   Alert fatigued appearing 55 year old female in NAD. Heart: RRR, no murmur.  Pulm:  Breath sounds clear throughout.  Abdomen: Distended, generalized tenderness throughout without rebound or guarding. Hypoactive BS x 4 quads. Perc drain site to RUQ intact, approximately 10cc of bilious fluid in collection bag at this time.  Extremities:  Without edema. Neurologic:  Alert and  oriented x4;  grossly normal neurologically. Psych:  Alert and cooperative. Normal mood and affect.  Intake/Output from previous day: 02/06 0701 - 02/07 0700 In: 4713.4 [P.O.:1980; I.V.:2333.3; IV Piggyback:400.1] Out: 6501 [Urine:6050; Emesis/NG output:1; Drains:450]   Lab Results: Recent Labs    06/29/20 0847 06/30/20 0550 07/01/20 0427  WBC 5.2 5.1 4.4  HGB 9.8* 9.4* 10.3*  HCT 34.1* 31.8* 35.8*  PLT 132* 142* 149*   BMET Recent Labs    06/29/20 0847 06/30/20 0550 07/01/20 0427  NA 141 142 140  K 3.6 4.0 3.5  CL 113* 111 109  CO2 21* 24 22  GLUCOSE 100* 128* 116*  BUN 13 11 9   CREATININE 0.43* 0.52 0.43*  CALCIUM 8.6* 8.7* 8.9   LFT Recent Labs    07/01/20 0427  PROT 7.2  ALBUMIN 3.7  AST 49*  ALT 31  ALKPHOS 128*  BILITOT 0.5    Assessment / Plan:  53. 55 year old female s/p cholecystectomy 07/2019 with persistent bile leak s/p multiple ERCPs with most recent ERCP 06/26/2020 which showed a patent prior biliary  sphincterotomy, one partially occluded migrated stent from the biliary tree was seen in the major papilla which was removed, main bile duct was dilated, the biliary tree was swept and sludge was found, biliary extravasation was noted in the cystic duct into the perc drain and 4 plastic bilary stents were placed in the common bile duct. She was admitted to the hospital 2/3 with post ERCP  pancreatitis. Lipase 1155. CTAP 2/3 showed perc drain catheter coiled in the gallbladder fossa without focal fluid collection, 4 biliary stents were intact. Percutaneous biliary drain output 450cc past 24 hours (prior 24 output was 318cc). Today Lipase 59. Alk phos 128. T. Bili 0.5. AST 49. ALT 31. She continues to have nausea, dry heaves. Vomited bilious emesis x 2 yesterday.  -Cortrack placement for nutritional support -Stop Cipro IV after 5th dose received today -Continue IV fluids -Change Zofran 63m IV Q 6 hours next 24 hours   -Pain management per the hospitalist  -Consider repeat CTAP if abdominal pain worsens -CBC, CMP in am  2. Cirrhosis, suspect NASH with splenomegaly and thrombocytopenia   3. Normocytic anemia, stable. Hg 10.3. No overt GI bleeding.   4. DM II  Further recommendations per Dr. JArdis Hughs    LOS: 3 days   CSHAKEDA PEARSE 07/01/2020, 10:00AM   ________________________________________________________________________  LVelora HecklerGI MD note:  I personally examined the patient, reviewed the data and agree with the assessment and plan described above. I am meeting her for the first time today.  She has undergone 5 ERCPs for this 07/2019 post cholecystectomy bile leak.  Currently recovering from post 2/2 ERCP related pancreatitis (lipase 1800 max but CT no acute inflammation).  She has abd pains but is only mildly tender on examination, has normal bowel sounds and is passing gas.  Her bigger issues seems to be inability to eat due to nausea and retching.  I think a nasal feeding tube may be helpful as well as initiating scheduled antiemetics (zofran q 6 hours RTC). We will order.  She understands it is important to keep ambulating as best that she can.  DOwens Loffler MD [facility] Pager [phone]

## 2020-07-01 NOTE — Progress Notes (Signed)
PROGRESS NOTE   Kim Hicks  LXB:[id number] DOB: Aug 02, 1965 DOA: 06/27/2020  PCP: [name], FNP    Brief Narrative: 55 year old female with history of liver cirrhosis from NASH, type 2 diabetes, type 2 diabetes mellitus, migraine, hyperlipidemia, hypertension, neuropathy, GERD, IBS, cholecystitis complicated by persistent bile leak who had ERCP for infected biloma, had stent placed x4 on 06/26/20.  She presented with complains of abdominal pain and chills.  Was found to have acute pancreatitis.  Was hospitalized for further management.  Gastroenterology was consulted.   Subjective: Patient continues to feel poorly.  Continues to have significant pain in her abdomen.  Rating it today at 9 out of 10 in intensity.  Had a few episodes of nausea and vomiting overnight.  Unable to tolerate her diet.   Assessment & Plan:  Post ERCP pancreatitis Patient has significantly elevated lipase level of 1800.  Has been trending down.  Remains tender.  Abdomen benign for the most part.  Gastroenterology is following.  Patient not able to tolerate her diet.  Per gastroenterology note plan appears to be for a nasogastric feeding tube placement for nutritional purposes. Due to concern for biliary process with recent stent placement patient remains empirically on ciprofloxacin.    Diabetes mellitus type 2, insulin-dependent At home she has on Basaglar 30 units nightly, Trulicity 4 mg every week and Synjardy, metformin.   Monitor CBGs.  HbA1c 8.9.  Continue SSI.  On Lantus as well.  CBGs noted to be running borderline low.  Will decrease the dose of Lantus.  Normal anion gap metabolic acidosis Seems to have resolved.  Anemia of chronic disease Hemoglobin low but stable.  No evidence of overt blood loss.   Essential hypertension/hyperlipidemia Occasional high readings noted which could be due to pain.  Antihypertensives on hold currently.  GERD  On PPI.    Chronic constipation Laxatives  Liver  cirrhosis due to NASH Outpatient monitoring per GI.   DVT prophylaxis: Initiate Lovenox  Code Status: Full code Family Communication: Discussed with patient.  No family at bedside Disposition: Hopefully return home when improved  Status is: Inpatient  Remains inpatient appropriate because:Ongoing active pain requiring inpatient pain management, IV treatments appropriate due to intensity of illness or inability to take PO and Inpatient level of care appropriate due to severity of illness   Dispo:  Patient From: Home  Planned Disposition: To be determined  Expected discharge date: 07/04/2020  Medically stable for discharge: No        Consultants: [facility] gastroenterology Procedures: None yet  Culture/Microbiology    Component Value Date/Time   SDES  05/23/2020 1557    ABSCESS GALLBLADDER FOSSA FLUID Performed at [facility], [place] [address], [place], [place] [address]    [place]  05/23/2020 1557    Normal Performed at [facility], [place] [address] [place], [place] [address]    CULT  05/23/2020 1557    RARE ENTEROCOCCUS FAECALIS NO ANAEROBES ISOLATED Performed at [facility], [place] [address], [place], [place] [address]    REPTSTATUS 05/28/2020 FINAL 05/23/2020 1557      Medications: Scheduled Meds: . brexpiprazole  2 mg Oral QHS  . enoxaparin (LOVENOX) injection  40 mg Subcutaneous Daily  . insulin aspart  0-9 Units Subcutaneous Q4H  . insulin glargine  15 Units Subcutaneous QHS  . ondansetron (ZOFRAN) IV  4 mg Intravenous Q6H  . pantoprazole  40 mg Oral QHS  . pregabalin  100 mg Oral TID  .  QUEtiapine  800 mg Oral QHS  . senna  1 tablet Oral BID  . topiramate  200 mg Oral BID  . vortioxetine HBr  20 mg Oral QHS  . ziprasidone  20 mg Oral QHS   Continuous Infusions: . sodium chloride 100 mL/hr at 07/01/20 0906  . ciprofloxacin 400 mg (07/01/20 0906)    Antimicrobials: Anti-infectives (From admission, onward)    Start     Dose/Rate Route Frequency Ordered Stop   06/27/20 2100  ciprofloxacin (CIPRO) IVPB 400 mg        400 mg 200 mL/hr over 60 Minutes Intravenous Every 12 hours 06/27/20 2057 07/02/20 1319     Objective: Vitals: Today's Vitals   07/01/20 0627 07/01/20 0724 07/01/20 0800 07/01/20 1100  BP:      Pulse:      Resp:      Temp:      TempSrc:      SpO2:      Weight:      Height:      PainSc: 9  3  8  7      Intake/Output Summary (Last 24 hours) at 07/01/2020 1144 Last data filed at 07/01/2020 0934 Gross per 24 hour  Intake 4584.49 ml  Output 4950 ml  Net -365.51 ml   Filed Weights   06/27/20 1552  Weight: 72.6 kg   Weight change:   Intake/Output from previous day: 02/06 0701 - 02/07 0700 In: 4713.4 [P.O.:1980; I.V.:2333.3; IV Piggyback:400.1] Out: 6501 [Urine:6050; Emesis/NG output:1; Drains:450] Intake/Output this shift: Total I/O In: 733.3 [P.O.:360; I.V.:283.2; IV Piggyback:90.2] Out: 200 [Urine:200] Filed Weights   06/27/20 1552  Weight: 72.6 kg    Examination:  General appearance: Awake alert.  In no distress Resp: Clear to auscultation bilaterally.  Normal effort Cardio: S1-S2 is normal regular.  No S3-S4.  No rubs murmurs or bruit GI: Abdomen is soft.  Remains tender in the epigastric area without any rebound rigidity or guarding.  No masses organomegaly.  Bowel sounds present.  Extremities: No edema.  Full range of motion of lower extremities. Neurologic: Alert and oriented x3.  No focal neurological deficits.       Data Reviewed: I have personally reviewed following labs and imaging studies CBC: Recent Labs  Lab 06/27/20 1658 06/28/20 0446 06/29/20 0847 06/30/20 0550 07/01/20 0427  WBC 6.2 6.8 5.2 5.1 4.4  NEUTROABS 3.7 4.2  --   --   --   HGB 9.8* 9.3* 9.8* 9.4* 10.3*  HCT 33.5* 32.5* 34.1* 31.8* 35.8*  MCV 83.3 84.2 84.8 83.5 84.4  PLT 161 144* 132* 142* 099*   Basic Metabolic Panel: Recent Labs  Lab 06/27/20 1658 06/28/20 0446  06/29/20 0847 06/30/20 0550 07/01/20 0427  NA 139 139 141 142 140  K 4.2 4.0 3.6 4.0 3.5  CL 109 112* 113* 111 109  CO2 19* 21* 21* 24 22  GLUCOSE 356* 186* 100* 128* 116*  BUN 25* 16 13 11 9   CREATININE 0.83 0.43* 0.43* 0.52 0.43*  CALCIUM 8.7* 8.6* 8.6* 8.7* 8.9  MG  --  1.8  --   --  1.7  PHOS  --  2.7  --   --   --    GFR: Estimated Creatinine Clearance: 79.3 mL/min (A) (by C-G formula based on SCr of 0.43 mg/dL (L)).  Liver Function Tests: Recent Labs  Lab 06/27/20 1658 06/28/20 0446 06/29/20 0847 06/30/20 0550 07/01/20 0427  AST 44* 38 40 39 49*  ALT 30 27 26 24  31  ALKPHOS 142* 117 119 118 128*  BILITOT 0.4 0.5 0.5 0.6 0.5  PROT 7.7 6.7 6.6 6.4* 7.2  ALBUMIN 3.9 3.5 3.4* 3.4* 3.7   Recent Labs  Lab 06/27/20 1658 06/28/20 0446 06/29/20 0847 06/30/20 0550 07/01/20 0427  LIPASE 1,155* 1,838* 1,052* 294* 59*   Coagulation Profile: Recent Labs  Lab 06/27/20 1700  INR 1.0   Cardiac Enzymes: Recent Labs  Lab 06/28/20 0446  CKTOTAL 22*   CBG: Recent Labs  Lab 06/30/20 1958 07/01/20 0012 07/01/20 0401 07/01/20 0745 07/01/20 1133  GLUCAP 172* 92 119* 90 96   Sepsis Labs: Recent Labs  Lab 06/28/20 0446  LATICACIDVEN 0.7    Recent Results (from the past 240 hour(s))  SARS CORONAVIRUS 2 (TAT 6-24 HRS) Nasopharyngeal Nasopharyngeal Swab     Status: None   Collection Time: 06/22/20  1:27 PM   Specimen: Nasopharyngeal Swab  Result Value Ref Range Status   SARS Coronavirus 2 NEGATIVE NEGATIVE Final    Comment: (NOTE) SARS-CoV-2 target nucleic acids are NOT DETECTED.  The SARS-CoV-2 RNA is generally detectable in upper and lower respiratory specimens during the acute phase of infection. Negative results do not preclude SARS-CoV-2 infection, do not rule out co-infections with other pathogens, and should not be used as the sole basis for treatment or other patient management decisions. Negative results must be combined with clinical  observations, patient history, and epidemiological information. The expected result is Negative.  Fact Sheet for Patients: SugarRoll.be  Fact Sheet for Healthcare Providers: https://www.woods-mathews.com/  This test is not yet approved or cleared by the [place] FDA and  has been authorized for detection and/or diagnosis of SARS-CoV-2 by FDA under an Emergency Use Authorization (EUA). This EUA will remain  in effect (meaning this test can be used) for the duration of the COVID-19 declaration under Se ction 564(b)(1) of the Act, 21 U.S.C. section 360bbb-3(b)(1), unless the authorization is terminated or revoked sooner.  Performed at [facility], [place] [address], [place], [place] [address]   SARS CORONAVIRUS 2 (TAT 6-24 HRS) Nasopharyngeal Nasopharyngeal Swab     Status: None   Collection Time: 06/27/20  8:37 PM   Specimen: Nasopharyngeal Swab  Result Value Ref Range Status   SARS Coronavirus 2 NEGATIVE NEGATIVE Final    Comment: (NOTE) SARS-CoV-2 target nucleic acids are NOT DETECTED.  The SARS-CoV-2 RNA is generally detectable in upper and lower respiratory specimens during the acute phase of infection. Negative results do not preclude SARS-CoV-2 infection, do not rule out co-infections with other pathogens, and should not be used as the sole basis for treatment or other patient management decisions. Negative results must be combined with clinical observations, patient history, and epidemiological information. The expected result is Negative.  Fact Sheet for Patients: SugarRoll.be  Fact Sheet for Healthcare Providers: https://www.woods-mathews.com/  This test is not yet approved or cleared by the [place] FDA and  has been authorized for detection and/or diagnosis of SARS-CoV-2 by FDA under an Emergency Use Authorization (EUA). This EUA will remain  in effect (meaning this  test can be used) for the duration of the COVID-19 declaration under Se ction 564(b)(1) of the Act, 21 U.S.C. section 360bbb-3(b)(1), unless the authorization is terminated or revoked sooner.  Performed at [facility], [place] [address], [place], [place] [address]      Radiology Studies: No results found.   LOS: 3 days   Bonnielee Haff, MD [facility]  07/01/2020, 11:44 AM

## 2020-07-01 NOTE — Care Management Important Message (Signed)
Important Message  Patient Details IM Letter given to the Patient. Name: Kim Hicks MRN: [id number] Date of Birth: [birth year]   Medicare Important Message Given:  Yes     Kerin Salen 07/01/2020, 3:07 PM

## 2020-07-02 ENCOUNTER — Ambulatory Visit: Payer: Medicare Other | Admitting: Nurse Practitioner

## 2020-07-02 ENCOUNTER — Inpatient Hospital Stay (HOSPITAL_COMMUNITY): Payer: Medicare Other

## 2020-07-02 DIAGNOSIS — Z794 Long term (current) use of insulin: Secondary | ICD-10-CM | POA: Diagnosis not present

## 2020-07-02 DIAGNOSIS — R1084 Generalized abdominal pain: Secondary | ICD-10-CM | POA: Diagnosis not present

## 2020-07-02 DIAGNOSIS — K839 Disease of biliary tract, unspecified: Secondary | ICD-10-CM | POA: Diagnosis not present

## 2020-07-02 DIAGNOSIS — K859 Acute pancreatitis without necrosis or infection, unspecified: Secondary | ICD-10-CM | POA: Diagnosis not present

## 2020-07-02 DIAGNOSIS — E119 Type 2 diabetes mellitus without complications: Secondary | ICD-10-CM | POA: Diagnosis not present

## 2020-07-02 DIAGNOSIS — K9189 Other postprocedural complications and disorders of digestive system: Secondary | ICD-10-CM | POA: Diagnosis not present

## 2020-07-02 LAB — COMPREHENSIVE METABOLIC PANEL
ALT: 38 U/L (ref 0–44)
AST: 63 U/L — ABNORMAL HIGH (ref 15–41)
Albumin: 3.6 g/dL (ref 3.5–5.0)
Alkaline Phosphatase: 133 U/L — ABNORMAL HIGH (ref 38–126)
Anion gap: 10 (ref 5–15)
BUN: 9 mg/dL (ref 6–20)
CO2: 21 mmol/L — ABNORMAL LOW (ref 22–32)
Calcium: 8.8 mg/dL — ABNORMAL LOW (ref 8.9–10.3)
Chloride: 109 mmol/L (ref 98–111)
Creatinine, Ser: 0.62 mg/dL (ref 0.44–1.00)
GFR, Estimated: >60 mL/min (ref >60)
Glucose, Bld: 113 mg/dL — ABNORMAL HIGH (ref 70–99)
Potassium: 3.8 mmol/L (ref 3.5–5.1)
Sodium: 140 mmol/L (ref 135–145)
Total Bilirubin: 0.6 mg/dL (ref 0.3–1.2)
Total Protein: 6.8 g/dL (ref 6.5–8.1)

## 2020-07-02 LAB — PHOSPHORUS
Phosphorus: 3 mg/dL (ref 2.5–4.6)
Phosphorus: 2.7 mg/dL (ref 2.5–4.6)

## 2020-07-02 LAB — MAGNESIUM
Magnesium: 1.7 mg/dL (ref 1.7–2.4)
Magnesium: 1.7 mg/dL (ref 1.7–2.4)

## 2020-07-02 LAB — CBC
HCT: 33.9 % — ABNORMAL LOW (ref 36.0–46.0)
Hemoglobin: 9.8 g/dL — ABNORMAL LOW (ref 12.0–15.0)
MCH: 24.3 pg — ABNORMAL LOW (ref 26.0–34.0)
MCHC: 28.9 g/dL — ABNORMAL LOW (ref 30.0–36.0)
MCV: 84.1 fL (ref 80.0–100.0)
Platelets: 133 10*3/uL — ABNORMAL LOW (ref 150–400)
RBC: 4.03 MIL/uL (ref 3.87–5.11)
RDW: 16 % — ABNORMAL HIGH (ref 11.5–15.5)
WBC: 4.5 10*3/uL (ref 4.0–10.5)
nRBC: 0 % (ref 0.0–0.2)

## 2020-07-02 LAB — GLUCOSE, CAPILLARY
Glucose-Capillary: 94 mg/dL (ref 70–99)
Glucose-Capillary: 82 mg/dL (ref 70–99)
Glucose-Capillary: 82 mg/dL (ref 70–99)
Glucose-Capillary: 67 mg/dL — ABNORMAL LOW (ref 70–99)
Glucose-Capillary: 99 mg/dL (ref 70–99)
Glucose-Capillary: 120 mg/dL — ABNORMAL HIGH (ref 70–99)
Glucose-Capillary: 170 mg/dL — ABNORMAL HIGH (ref 70–99)

## 2020-07-02 MED ORDER — FREE WATER
100.0000 mL | Status: DC
  Administered 2020-07-02 – 2020-07-09 (×35): 100 mL

## 2020-07-02 MED ORDER — PROSOURCE TF PO LIQD
45.0000 mL | Freq: Three times a day (TID) | ORAL | Status: DC
  Administered 2020-07-02 – 2020-07-10 (×22): 45 mL
  Filled 2020-07-02 (×30): qty 45

## 2020-07-02 MED ORDER — OSMOLITE 1.5 CAL PO LIQD
1000.0000 mL | ORAL | Status: AC
  Administered 2020-07-03 – 2020-07-08 (×2): 1000 mL
  Filled 2020-07-02 (×10): qty 1000

## 2020-07-02 MED ORDER — INSULIN GLARGINE 100 UNIT/ML ~~LOC~~ SOLN
5.0000 [IU] | Freq: Every day | SUBCUTANEOUS | Status: DC
  Administered 2020-07-03: 5 [IU] via SUBCUTANEOUS
  Filled 2020-07-02: qty 0.05

## 2020-07-02 MED ORDER — ONDANSETRON HCL 4 MG/2ML IJ SOLN
4.0000 mg | Freq: Four times a day (QID) | INTRAMUSCULAR | Status: DC | PRN
  Administered 2020-07-02 – 2020-07-03 (×2): 4 mg via INTRAVENOUS
  Filled 2020-07-02 (×2): qty 2

## 2020-07-02 MED ORDER — VITAL HIGH PROTEIN PO LIQD: 1000.0000 mL | ORAL | Status: DC

## 2020-07-02 NOTE — Progress Notes (Signed)
Placed NG panda tube in left nare. Stomach contents returned and auscultation heard. STAT abd xray placed. Initial measurement at 80cm. No complications during placement. Upon x-ray verification, stylet removed.

## 2020-07-02 NOTE — Progress Notes (Signed)
Called [employer] regarding kangaroo feeding pump, was told there wasn't one available and one will be brought to patients room when it is available.

## 2020-07-02 NOTE — Progress Notes (Addendum)
[facility] Progress Note  CC:  Post ERCP pancreatitis, persistent biliary leak s/p cholecystectomy 07/2019   Subjective:  She continues to have generalized abdominal pain, worse to the RUQ. Her nausea has improved with Zofran Iv Q 6 hours. Feeding tube to be placed by RN at the bed side. No BM x 2 days.    Objective:   CTAP 06/27/2020:  1. Percutaneous drainage catheter coiled in the gallbladder fossa without focal fluid collection. 2. New 4 biliary stents extending from the porta hepatis to the duodenum. Common bile duct is decompressed by biliary stents. No intrahepatic biliary ductal dilatation. 3. No other acute findings. 4. Cirrhotic hepatic morphology with splenomegaly. 5. Large colonic stool burden with transverse colonic tortuosity, suggesting constipation. Distal small bowel mildly fluid-filled and prominent, with fecalization of distal small bowel contents, suggesting slow transit  ERCP 2/02/20221: - No gross lesions in esophagus. Z-line regular, 40 cm from the incisors. - Gastritis. No other gross lesions in the stomach. Biopsied. - No gross lesions in the duodenal bulb, in the first portion of the duodenum and in the second portion of the duodenum. - Prior biliary sphincterotomy appeared open. - One partially occluded migrated stent from the biliary tree was seen in the major papilla. This was removed. - The entire main bile duct was moderately dilated. - The biliary tree was swept and sludge was found. - Biliary extravasation was noted from cystic duct into the percutaneous drain. - Four plastic biliary stents were placed into the common bile duct.  Vital signs in last 24 hours: Temp:  [97.5 F (36.4 C)-98 F (36.7 C)] 97.5 F (36.4 C) (02/08 0506) Pulse Rate:  [86-88] 86 (02/08 0506) Resp:  [17-18] 18 (02/08 0506) BP: (100-128)/(61-79) 128/79 (02/08 0506) SpO2:  [95 %-98 %] 95 % (02/08 0506) Last BM Date: 06/30/20 General:   Alert 55  year old female in NAD. Heart: RRR, no murmur.  Pulm:  Breath sounds clear throughout.  Abdomen: Mildly distended. RUQ and epigastric tenderness without rebound or guarding. + BS x 4 quads. Perc drain site intact, approximately 20cc of bilious fluid in the collection bag.  Extremities:  Without edema. Neurologic:  Alert and  oriented x4;  grossly normal neurologically. Psych:  Alert and cooperative. Normal mood and affect.  Intake/Output from previous day: 02/07 0701 - 02/08 0700 In: 2418.5 [P.O.:1080; I.V.:907; IV Piggyback:426.5] Out: 3665 [Urine:3575; Drains:90]  Lab Results: Recent Labs    06/30/20 0550 07/01/20 0427 07/02/20 0446  WBC 5.1 4.4 4.5  HGB 9.4* 10.3* 9.8*  HCT 31.8* 35.8* 33.9*  PLT 142* 149* 133*   BMET Recent Labs    06/30/20 0550 07/01/20 0427 07/02/20 0446  NA 142 140 140  K 4.0 3.5 3.8  CL 111 109 109  CO2 24 22 21*  GLUCOSE 128* 116* 113*  BUN 11 9 9   CREATININE 0.52 0.43* 0.62  CALCIUM 8.7* 8.9 8.8*   LFT Recent Labs    07/02/20 0446  PROT 6.8  ALBUMIN 3.6  AST 63*  ALT 38  ALKPHOS 133*  BILITOT 0.6   PT/INR No results for input(s): LABPROT, INR in the last 72 hours. Hepatitis Panel No results for input(s): HEPBSAG, HCVAB, HEPAIGM, HEPBIGM in the last 72 hours.  No results found.  Assessment / Plan:  43. 55 year old female s/p cholecystectomy 07/2019 with persistent bile leak s/p multiple ERCPs with most recent ERCP 06/26/2020 which showed a patent prior biliary sphincterotomy, one partially occluded migrated stent  from the biliary tree was seen in the major papilla which was removed, main bile duct was dilated, the biliary tree was swept and sludge was found, biliary extravasation was noted in the cystic duct into the perc drain and 4 plastic bilary stents were placed in the common bile duct. She was admitted to the hospital 2/3 with post ERCP pancreatitis. Lipase 1155. CTAP 2/3 showed perc drain catheter coiled in the gallbladder fossa  without focal fluid collection, 4 biliary stents were intact. Percutaneous biliary drain output 90 cc past 24 hrs. Today Alk phos 133. AST 63. ALT 38. T. Bili 0.6. -Continue IV fluids -Continue Zofran 18m IV Q 6 hours -Pain management per the hospitalist  -CBC, CMP in am -RN to place feeding tube at bed side, abdominal xray to verify placement. Nutritional consul order placed on 2/7.  -Further recommendations per Dr. JArdis Hughs  2. Cirrhosis, suspect NASH with splenomegaly and thrombocytopenia   3. Normocytic anemia. Hg 9.8. No overt GI bleeding.  -Repeat CBC in am  4. DM II  Further recommendations per Dr. JArdis Hughs    LOS: 4 days   Kim Hicks 07/02/2020, 11:43 AM  ________________________________________________________________________  Kim HecklerGI MD note:  I personally examined the patient, reviewed the data and agree with the assessment and plan described above.  She is still uncomfortable with abd pains and unable to eat due to the pain and nausea.  She is getting a nasal feeding tube now to be used to nutritional support.  If she feels she is able to eat orally in addition that is certainly OK. We will follow along.   Kim Loffler MD [facility] Pager [phone]

## 2020-07-02 NOTE — Progress Notes (Signed)
PROGRESS NOTE   Kim Hicks  XBD:[id number] DOB: 14-Mar-1966 DOA: 06/27/2020  PCP: [name], FNP    Brief Narrative: 55 year old female with history of liver cirrhosis from NASH, type 2 diabetes, type 2 diabetes mellitus, migraine, hyperlipidemia, hypertension, neuropathy, GERD, IBS, cholecystitis complicated by persistent bile leak who had ERCP for infected biloma, had stent placed x4 on 06/26/20.  She presented with complains of abdominal pain and chills.  Was found to have acute pancreatitis.  Was hospitalized for further management.  Gastroenterology was consulted.   Subjective: Patient continues to have abdominal pain with nausea and vomiting.  Denies any other new complaints.  Pain is 8 out of 10 in intensity.   Assessment & Plan:  Post ERCP pancreatitis Patient had significantly elevated lipase level of 1800.  Gastroenterology was consulted and they are following.  Abdomen remains tender though benign. Plan is for nasogastric feeding tube placement for nutritional purposes.   Due to recent biliary stent placement patient was placed on ciprofloxacin.  Will discontinue as she has completed course.    Diabetes mellitus type 2, insulin-dependent At home she has on Basaglar 30 units nightly, Trulicity 4 mg every week and Synjardy, metformin.   Monitor CBGs.  HbA1c 8.9.  Continue SSI.  On Lantus as well.  CBGs were noted to be low so Lantus dose was decreased yesterday.  Will likely go up again once tube feedings are initiated.   Normal anion gap metabolic acidosis Seems to have resolved.  Anemia of chronic disease Hemoglobin low but stable.  No evidence of overt blood loss.   Essential hypertension/hyperlipidemia Occasional high readings noted which could be due to pain.  Antihypertensives on hold currently.  GERD  On PPI.    Chronic constipation Laxatives  Liver cirrhosis due to NASH Outpatient monitoring per GI.   DVT prophylaxis: Lovenox  Code Status: Full  code Family Communication: Discussed with patient.  No family at bedside Disposition: Hopefully return home when improved  Status is: Inpatient  Remains inpatient appropriate because:Ongoing active pain requiring inpatient pain management, IV treatments appropriate due to intensity of illness or inability to take PO and Inpatient level of care appropriate due to severity of illness   Dispo:  Patient From: Home  Planned Disposition: To be determined  Expected discharge date: 07/04/2020  Medically stable for discharge: No        Consultants: [facility] gastroenterology Procedures: None yet  Culture/Microbiology    Component Value Date/Time   SDES  05/23/2020 1557    ABSCESS GALLBLADDER FOSSA FLUID Performed at [facility], [place] [address], [place], [place] [address]    [place]  05/23/2020 1557    Normal Performed at [facility], [place] [address] [place], [place] [address]    CULT  05/23/2020 1557    RARE ENTEROCOCCUS FAECALIS NO ANAEROBES ISOLATED Performed at [facility], [place] [address], [place], [place] [address]    REPTSTATUS 05/28/2020 FINAL 05/23/2020 1557      Medications: Scheduled Meds: . brexpiprazole  2 mg Oral QHS  . enoxaparin (LOVENOX) injection  40 mg Subcutaneous Daily  . insulin aspart  0-9 Units Subcutaneous Q4H  . insulin glargine  5 Units Subcutaneous QHS  . pantoprazole  40 mg Oral QHS  . pregabalin  100 mg Oral TID  . QUEtiapine  800 mg Oral QHS  . senna  1 tablet Oral BID  . topiramate  200 mg Oral BID  . vortioxetine HBr  20 mg Oral  QHS  . ziprasidone  20 mg Oral QHS   Continuous Infusions: . sodium chloride 100 mL/hr at 07/02/20 0843    Antimicrobials: Anti-infectives (From admission, onward)   Start     Dose/Rate Route Frequency Ordered Stop   06/27/20 2100  ciprofloxacin (CIPRO) IVPB 400 mg        400 mg 200 mL/hr over 60 Minutes Intravenous Every 12 hours 06/27/20 2057 07/02/20 0944      Objective: Vitals: Today's Vitals   07/02/20 0506 07/02/20 0520 07/02/20 0550 07/02/20 0846  BP: 128/79     Pulse: 86     Resp: 18     Temp: (!) 97.5 F (36.4 C)     TempSrc: Oral     SpO2: 95%     Weight:      Height:      PainSc:  9  Asleep 10-Worst pain ever    Intake/Output Summary (Last 24 hours) at 07/02/2020 1127 Last data filed at 07/02/2020 1000 Gross per 24 hour  Intake 1685.2 ml  Output 3765 ml  Net -2079.8 ml   Filed Weights   06/27/20 1552  Weight: 72.6 kg   Weight change:   Intake/Output from previous day: 02/07 0701 - 02/08 0700 In: 2418.5 [P.O.:1080; I.V.:907; IV Piggyback:426.5] Out: 3665 [Urine:3575; Drains:90] Intake/Output this shift: Total I/O In: 0  Out: 300 [Urine:300] Filed Weights   06/27/20 1552  Weight: 72.6 kg    Examination:  General appearance: Awake alert.  In no distress Resp: Clear to auscultation bilaterally.  Normal effort Cardio: S1-S2 is normal regular.  No S3-S4.  No rubs murmurs or bruit GI: Abdomen is soft.  Remains tender in the epigastric area without any rebound rigidity or guarding.  No masses organomegaly. Extremities: No edema.  Full range of motion of lower extremities. Neurologic: Alert and oriented x3.  No focal neurological deficits.        Data Reviewed: I have personally reviewed following labs and imaging studies CBC: Recent Labs  Lab 06/27/20 1658 06/28/20 0446 06/29/20 0847 06/30/20 0550 07/01/20 0427 07/02/20 0446  WBC 6.2 6.8 5.2 5.1 4.4 4.5  NEUTROABS 3.7 4.2  --   --   --   --   HGB 9.8* 9.3* 9.8* 9.4* 10.3* 9.8*  HCT 33.5* 32.5* 34.1* 31.8* 35.8* 33.9*  MCV 83.3 84.2 84.8 83.5 84.4 84.1  PLT 161 144* 132* 142* 149* 832*   Basic Metabolic Panel: Recent Labs  Lab 06/28/20 0446 06/29/20 0847 06/30/20 0550 07/01/20 0427 07/02/20 0446  NA 139 141 142 140 140  K 4.0 3.6 4.0 3.5 3.8  CL 112* 113* 111 109 109  CO2 21* 21* 24 22 21*  GLUCOSE 186* 100* 128* 116* 113*  BUN 16 13 11 9  9   CREATININE 0.43* 0.43* 0.52 0.43* 0.62  CALCIUM 8.6* 8.6* 8.7* 8.9 8.8*  MG 1.8  --   --  1.7  --   PHOS 2.7  --   --   --   --    GFR: Estimated Creatinine Clearance: 79.3 mL/min (by C-G formula based on SCr of 0.62 mg/dL).  Liver Function Tests: Recent Labs  Lab 06/28/20 0446 06/29/20 0847 06/30/20 0550 07/01/20 0427 07/02/20 0446  AST 38 40 39 49* 63*  ALT 27 26 24 31  38  ALKPHOS 117 119 118 128* 133*  BILITOT 0.5 0.5 0.6 0.5 0.6  PROT 6.7 6.6 6.4* 7.2 6.8  ALBUMIN 3.5 3.4* 3.4* 3.7 3.6   Recent Labs  Lab 06/27/20  1658 06/28/20 0446 06/29/20 0847 06/30/20 0550 07/01/20 0427  LIPASE 1,155* 1,838* 1,052* 294* 59*   Coagulation Profile: Recent Labs  Lab 06/27/20 1700  INR 1.0   Cardiac Enzymes: Recent Labs  Lab 06/28/20 0446  CKTOTAL 22*   CBG: Recent Labs  Lab 07/01/20 1537 07/01/20 1946 07/01/20 2358 07/02/20 0349 07/02/20 0744  GLUCAP 147* 168* 103* 94 82   Sepsis Labs: Recent Labs  Lab 06/28/20 0446  LATICACIDVEN 0.7    Recent Results (from the past 240 hour(s))  SARS CORONAVIRUS 2 (TAT 6-24 HRS) Nasopharyngeal Nasopharyngeal Swab     Status: None   Collection Time: 06/22/20  1:27 PM   Specimen: Nasopharyngeal Swab  Result Value Ref Range Status   SARS Coronavirus 2 NEGATIVE NEGATIVE Final    Comment: (NOTE) SARS-CoV-2 target nucleic acids are NOT DETECTED.  The SARS-CoV-2 RNA is generally detectable in upper and lower respiratory specimens during the acute phase of infection. Negative results do not preclude SARS-CoV-2 infection, do not rule out co-infections with other pathogens, and should not be used as the sole basis for treatment or other patient management decisions. Negative results must be combined with clinical observations, patient history, and epidemiological information. The expected result is Negative.  Fact Sheet for Patients: SugarRoll.be  Fact Sheet for Healthcare  Providers: https://www.woods-mathews.com/  This test is not yet approved or cleared by the [place] FDA and  has been authorized for detection and/or diagnosis of SARS-CoV-2 by FDA under an Emergency Use Authorization (EUA). This EUA will remain  in effect (meaning this test can be used) for the duration of the COVID-19 declaration under Se ction 564(b)(1) of the Act, 21 U.S.C. section 360bbb-3(b)(1), unless the authorization is terminated or revoked sooner.  Performed at [facility], [place] [address], [place], [place] [address]   SARS CORONAVIRUS 2 (TAT 6-24 HRS) Nasopharyngeal Nasopharyngeal Swab     Status: None   Collection Time: 06/27/20  8:37 PM   Specimen: Nasopharyngeal Swab  Result Value Ref Range Status   SARS Coronavirus 2 NEGATIVE NEGATIVE Final    Comment: (NOTE) SARS-CoV-2 target nucleic acids are NOT DETECTED.  The SARS-CoV-2 RNA is generally detectable in upper and lower respiratory specimens during the acute phase of infection. Negative results do not preclude SARS-CoV-2 infection, do not rule out co-infections with other pathogens, and should not be used as the sole basis for treatment or other patient management decisions. Negative results must be combined with clinical observations, patient history, and epidemiological information. The expected result is Negative.  Fact Sheet for Patients: SugarRoll.be  Fact Sheet for Healthcare Providers: https://www.woods-mathews.com/  This test is not yet approved or cleared by the [place] FDA and  has been authorized for detection and/or diagnosis of SARS-CoV-2 by FDA under an Emergency Use Authorization (EUA). This EUA will remain  in effect (meaning this test can be used) for the duration of the COVID-19 declaration under Se ction 564(b)(1) of the Act, 21 U.S.C. section 360bbb-3(b)(1), unless the authorization is terminated or revoked  sooner.  Performed at [facility], [place] [address], [place], [place] [address]      Radiology Studies: No results found.   LOS: 4 days   Bonnielee Haff, MD [facility]  07/02/2020, 11:27 AM

## 2020-07-02 NOTE — Plan of Care (Signed)
  Problem: Coping: Goal: Level of anxiety will decrease Outcome: Progressing   Problem: Pain Managment: Goal: General experience of comfort will improve Outcome: Progressing   Problem: Safety: Goal: Ability to remain free from injury will improve Outcome: Progressing   

## 2020-07-02 NOTE — Progress Notes (Addendum)
Nutrition Follow-up  DOCUMENTATION CODES:   Not applicable  INTERVENTION:  - if feasible, may benefit from post-pyloric placement of feeding tube.   - will order TF for once NGT placed: Osmolite 1.5 @ 25 ml/hr to advance by 10 ml every 8 hours to reach goal rate of 55 ml/hr with 45 ml Prosource TF TID and 100 ml free water every 4 hours. - at goal rate, this regimen will provide 2100 kcal, 116 grams protein, and 1606 ml free water.  - continue to encourage PO intakes as tolerated.  - will look to change TF regimen to nocturnal, if warranted, once patient is consuming more PO.    NUTRITION DIAGNOSIS:   Inadequate oral intake related to poor appetite as evidenced by per patient/family report. -ongoing  GOAL:   Patient will meet greater than or equal to 90% of their needs -unmet/unable to meet  MONITOR:   TF tolerance,PO intake,Labs,Weight trends  REASON FOR ASSESSMENT:   Consult Enteral/tube feeding initiation and management  ASSESSMENT:   55 year old female admitted for post ERCP acute pancreatitis presented with abdominal pain and chills. Past medical history of NASH liver cirrhosis, DM2, migraines, HLD, HTN, neuropathy, GERD, IBS, cholecystitis complicated by persistent bile leak s/p ERCP with stent x 4 on 2/02.  Diet advanced from NPO to CLD on 2/4 at 1750, to Soft on 2/5 at 1207, to Regular yesterday 1440, and then downgraded to FLD yesterday at 1451.   Documented intakes were: 60% of breakfast and 75% of dinner on 2/5; 100% of breakfast, 30% of lunch, and 25% of dinner on 2/6; 0% of breakfast this AM.  Patient reports that she was told that she is currently NPO. She reports that when she was on Soft and Regular diet she was unable to tolerate food d/t abdominal pain and nausea and that for dinner last night she had a cup of ice cream but was unable to bring herself to consume anything else.   She states that she underwent ERCP last Wednesday (2/2) and that starting on  Thursday (2/3) she had severe abdominal pain which is sharp in nature and nearly constant. It ends along her lower abdomen, close to pelvic region, and extends up her R flank. She did not have this pain prior to ERCP.   Patient reports being informed of plan for small bore NGT placement. Able to discuss this with patient and answer all questions that she had regarding it. Also discussed plan concerning tube feeding.   She has not been weighed since admission on 2/3.   GI is following.   Labs reviewed; CBGs: 94, 82, 82 mg/dl. Medications reviewed; sliding scale novolog, 5 units lantus/day, 2 g IV Mg sulfate x1 run 2/7, 40 mg protonix/day, 1 tablet senokot BID.  IVF; NS @ 100 ml/hr.      NUTRITION - FOCUSED PHYSICAL EXAM:  completed; no muscle or fat depletions, mild non-pitting edema to BLE.   Diet Order:   Diet Order            Diet full liquid Room service appropriate? Yes; Fluid consistency: Thin  Diet effective now                 EDUCATION NEEDS:   Education needs have been addressed  Skin:  Skin Assessment: Reviewed RN Assessment  Last BM:  2/6  Height:   Ht Readings from Last 1 Encounters:  06/27/20 5' 5"  (1.651 m)    Weight:   Wt Readings from Last  1 Encounters:  06/27/20 72.6 kg    Estimated Nutritional Needs:  Kcal:  2000-2200 Protein:  110-125 Fluid:  >2 L      Jarome Matin, MS, RD, LDN, CNSC Inpatient Clinical Dietitian RD pager # available in [place]  After hours/weekend pager # available in [facility]

## 2020-07-03 ENCOUNTER — Encounter (HOSPITAL_COMMUNITY): Payer: Self-pay | Admitting: Internal Medicine

## 2020-07-03 DIAGNOSIS — K859 Acute pancreatitis without necrosis or infection, unspecified: Secondary | ICD-10-CM | POA: Diagnosis not present

## 2020-07-03 DIAGNOSIS — E119 Type 2 diabetes mellitus without complications: Secondary | ICD-10-CM | POA: Diagnosis not present

## 2020-07-03 DIAGNOSIS — Z794 Long term (current) use of insulin: Secondary | ICD-10-CM | POA: Diagnosis not present

## 2020-07-03 LAB — GLUCOSE, CAPILLARY
Glucose-Capillary: 101 mg/dL — ABNORMAL HIGH (ref 70–99)
Glucose-Capillary: 113 mg/dL — ABNORMAL HIGH (ref 70–99)
Glucose-Capillary: 132 mg/dL — ABNORMAL HIGH (ref 70–99)
Glucose-Capillary: 139 mg/dL — ABNORMAL HIGH (ref 70–99)
Glucose-Capillary: 147 mg/dL — ABNORMAL HIGH (ref 70–99)

## 2020-07-03 LAB — COMPREHENSIVE METABOLIC PANEL
ALT: 40 U/L (ref 0–44)
AST: 67 U/L — ABNORMAL HIGH (ref 15–41)
Albumin: 3.5 g/dL (ref 3.5–5.0)
Alkaline Phosphatase: 129 U/L — ABNORMAL HIGH (ref 38–126)
Anion gap: 6 (ref 5–15)
BUN: 9 mg/dL (ref 6–20)
CO2: 25 mmol/L (ref 22–32)
Calcium: 8.8 mg/dL — ABNORMAL LOW (ref 8.9–10.3)
Chloride: 112 mmol/L — ABNORMAL HIGH (ref 98–111)
Creatinine, Ser: 0.58 mg/dL (ref 0.44–1.00)
GFR, Estimated: >60 mL/min (ref >60)
Glucose, Bld: 126 mg/dL — ABNORMAL HIGH (ref 70–99)
Potassium: 4 mmol/L (ref 3.5–5.1)
Sodium: 143 mmol/L (ref 135–145)
Total Bilirubin: 0.4 mg/dL (ref 0.3–1.2)
Total Protein: 6.5 g/dL (ref 6.5–8.1)

## 2020-07-03 LAB — CBC
HCT: 34.2 % — ABNORMAL LOW (ref 36.0–46.0)
Hemoglobin: 9.9 g/dL — ABNORMAL LOW (ref 12.0–15.0)
MCH: 24.5 pg — ABNORMAL LOW (ref 26.0–34.0)
MCHC: 28.9 g/dL — ABNORMAL LOW (ref 30.0–36.0)
MCV: 84.7 fL (ref 80.0–100.0)
Platelets: 146 10*3/uL — ABNORMAL LOW (ref 150–400)
RBC: 4.04 MIL/uL (ref 3.87–5.11)
RDW: 16.4 % — ABNORMAL HIGH (ref 11.5–15.5)
WBC: 4.7 10*3/uL (ref 4.0–10.5)
nRBC: 0 % (ref 0.0–0.2)

## 2020-07-03 LAB — MAGNESIUM: Magnesium: 1.8 mg/dL (ref 1.7–2.4)

## 2020-07-03 LAB — LIPASE, BLOOD: Lipase: 34 U/L (ref 11–51)

## 2020-07-03 LAB — PHOSPHORUS: Phosphorus: 2.8 mg/dL (ref 2.5–4.6)

## 2020-07-03 MED ORDER — ONDANSETRON HCL 4 MG/2ML IJ SOLN
4.0000 mg | Freq: Four times a day (QID) | INTRAMUSCULAR | Status: AC
  Administered 2020-07-03 – 2020-07-05 (×8): 4 mg via INTRAVENOUS
  Filled 2020-07-03 (×8): qty 2

## 2020-07-03 NOTE — Progress Notes (Signed)
Called [employer] to see if kangaroo pump had become available they still do not have one but said that one will be brought to patients room when one does become available.

## 2020-07-03 NOTE — Consult Note (Signed)
Kim Hicks 12-26-1965  [id number].    Requesting MD: Dr. Owens Loffler Chief Complaint/Reason for Consult: persistent bile leak  HPI:  This is a 55 year old white female with a history of cirrhosis, diabetes, hypertension, hyperlipidemia who underwent a fenestrated laparoscopic cholecystectomy in March 2021 by Dr. Romana Juniper.  The patient developed a bile leak and has been followed for the last year by Dr. Fuller Plan with  [facility] gastroenterology.  She has undergone a total of 5 ERCPs with stent placement and exchanges as needed.  She has undergone a percutaneous drain placement by interventional radiology in January of this year.  Her last ERCP was last week where she had a stent exchanged/placement of 4 stents.  Her drain output has decreased since this procedure.  However, after this procedure she developed acutely worsening abdominal pain and presented to the emergency department for evaluation.  She was started on Cipro after this procedure.  She has subsequently developed nausea and vomiting.  She states that eating gives her significant epigastric and right upper quadrant abdominal pain as well as nausea.  She is currently on scheduled antiemetics.  She also currently has a panda tube in place and is supposedly getting Osmolite for nutrition.  She is also on full liquids which she is not tolerating very well.  Due to a persistent bile leak, we have been asked to evaluate the patient to see if there is any further surgical input at this point.  ROS: ROS: Please see HPI, otherwise all other systems reviewed and are currently negative.  She is still passing flatus and last had a bowel movement 2 days ago.  Family History  Problem Relation Age of Onset  . Diabetes Mother   . Heart failure Mother   . Depression Mother   . Colon cancer Neg Hx   . Stomach cancer Neg Hx   . Esophageal cancer Neg Hx   . Pancreatic cancer Neg Hx     Past Medical History:  Diagnosis Date  . Anxiety   .  Cataract   . Cirrhosis of liver ([place])   . Depression   . Diabetes mellitus without complication ([place])   . Dysrhythmia   . Edema   . Family history of adverse reaction to anesthesia    mother had trouble waking up after surgery  . Foot fracture, left   . Headache    MIGRAINES  . Hyperlipidemia   . Hypertension   . Neuromuscular disorder ([place])    NEUROPATHY-both hands and feet  . Palpitations   . Wheezing     Past Surgical History:  Procedure Laterality Date  . ABDOMINAL HYSTERECTOMY     one ovary left  . BILIARY STENT PLACEMENT N/A 08/07/2019   Procedure: BILIARY STENT PLACEMENT;  Surgeon: [name], MD;  [facility];  Service: Endoscopy;  Laterality: N/A;  . BILIARY STENT PLACEMENT N/A 11/07/2019   Procedure: BILIARY STENT PLACEMENT;  Surgeon: [name], MD;  [facility];  Service: Endoscopy;  Laterality: N/A;  . BILIARY STENT PLACEMENT N/A 05/13/2020   Procedure: BILIARY STENT PLACEMENT;  Surgeon: [name], MD;  [facility];  Service: Endoscopy;  Laterality: N/A;  . BILIARY STENT PLACEMENT N/A 05/27/2020   Procedure: BILIARY STENT PLACEMENT;  Surgeon: [name], MD;  [facility];  Service: Endoscopy;  Laterality: N/A;  . BILIARY STENT PLACEMENT N/A 06/26/2020   Procedure: BILIARY STENT PLACEMENT;  Surgeon: [name], MD;  [facility];  Service: Gastroenterology;  Laterality: N/A;  . BIOPSY  06/26/2020   Procedure: BIOPSY;  Surgeon: [name], MD;  Location: [facility];  Service: Gastroenterology;;  . CATARACT EXTRACTION W/PHACO Right 01/11/2018   Procedure: CATARACT EXTRACTION PHACO AND INTRAOCULAR LENS PLACEMENT ([place]);  Surgeon: [name], MD;  Location: [facility] ORS;  Service: Ophthalmology;  Laterality: Right;  [place] 00:57.5 AP% 15.0 CDE 8.61 Fluid Pack Lot # [id number]  . CATARACT EXTRACTION W/PHACO Left 02/15/2018   Procedure: CATARACT EXTRACTION PHACO AND INTRAOCULAR  LENS PLACEMENT (IOC);  Surgeon: [name], MD;  Location: [facility] ORS;  Service: Ophthalmology;  Laterality: Left;  [place] 00:51 AP% 13.2 CDE 6.82 Fluid p ack lot # [id number]  . CHOLECYSTECTOMY N/A 08/04/2019   Procedure: LAPAROSCOPIC CHOLECYSTECTOMY;  Surgeon: [name], MD;  [facility];  Service: General;  Laterality: N/A;  . COLONOSCOPY    . ENDOSCOPIC RETROGRADE CHOLANGIOPANCREATOGRAPHY (ERCP) WITH PROPOFOL N/A 11/07/2019   Procedure: ENDOSCOPIC RETROGRADE CHOLANGIOPANCREATOGRAPHY (ERCP) WITH PROPOFOL;  Surgeon: [name], MD;  [facility];  Service: Endoscopy;  Laterality: N/A;  . ENDOSCOPIC RETROGRADE CHOLANGIOPANCREATOGRAPHY (ERCP) WITH PROPOFOL N/A 05/13/2020   Procedure: ENDOSCOPIC RETROGRADE CHOLANGIOPANCREATOGRAPHY (ERCP) WITH PROPOFOL;  Surgeon: [name], MD;  [facility];  Service: Endoscopy;  Laterality: N/A;  . ENDOSCOPIC RETROGRADE CHOLANGIOPANCREATOGRAPHY (ERCP) WITH PROPOFOL N/A 06/26/2020   Procedure: ENDOSCOPIC RETROGRADE CHOLANGIOPANCREATOGRAPHY (ERCP) WITH PROPOFOL;  Surgeon: [name], MD;  [facility];  Service: Gastroenterology;  Laterality: N/A;  . ERCP N/A 08/07/2019   Procedure: ENDOSCOPIC RETROGRADE CHOLANGIOPANCREATOGRAPHY (ERCP);  Surgeon: [name], MD;  [facility];  Service: Endoscopy;  Laterality: N/A;  . ERCP N/A 05/27/2020   Procedure: ENDOSCOPIC RETROGRADE CHOLANGIOPANCREATOGRAPHY (ERCP);  Surgeon: [name], MD;  [facility];  Service: Endoscopy;  Laterality: N/A;  . ESOPHAGOGASTRODUODENOSCOPY N/A 06/26/2020   Procedure: ESOPHAGOGASTRODUODENOSCOPY (EGD);  Surgeon: [name]., MD;  [facility];  Service: Gastroenterology;  Laterality: N/A;  . IR RADIOLOGIST EVAL & MGMT  06/20/2020  . RCR Bilateral   . REMOVAL OF STONES  08/07/2019   Procedure: REMOVAL OF STONES;  Surgeon: [name], MD;  [facility];  Service:  Endoscopy;;  balloon sweep, no stones  . REMOVAL OF STONES  05/27/2020   Procedure: REMOVAL OF STONES;  Surgeon: [name], MD;  [facility];  Service: Endoscopy;;  . REMOVAL OF STONES  06/26/2020   Procedure: REMOVAL OF STONES;  Surgeon: [name]., MD;  [facility];  Service: Gastroenterology;;  . [name]  08/07/2019   Procedure: SPHINCTEROTOMY;  Surgeon: [name], MD;  [facility];  Service: Endoscopy;;  . STENT REMOVAL  11/07/2019   Procedure: STENT REMOVAL;  Surgeon: [name], MD;  [facility];  Service: Endoscopy;;  . STENT REMOVAL  05/13/2020   Procedure: STENT REMOVAL;  Surgeon: [name], MD;  [facility];  Service: Endoscopy;;  . STENT REMOVAL  05/27/2020   Procedure: STENT REMOVAL;  Surgeon: [name], MD;  [facility];  Service: Endoscopy;;  . STENT REMOVAL  06/26/2020   Procedure: STENT REMOVAL;  Surgeon: [name]., MD;  [facility];  Service: Gastroenterology;;    Social History:  reports that she has never smoked. She has never used smokeless tobacco. She reports that she does not drink alcohol and does not use drugs.  Allergies:  Allergies  Allergen Reactions  . Talwin [Pentazocine]  Turned blue around lips and face, rash all over body    Medications Prior to Admission  Medication Sig Dispense Refill  . acetaminophen (TYLENOL) 500 MG tablet Take 1,000 mg by mouth every 6 (six) hours as needed for moderate pain.    . brexpiprazole (REXULTI) 2 MG TABS tablet Take 2 mg by mouth at bedtime.    . Dulaglutide 4.5 MG/0.5ML SOPN Inject 4.5 mg into the skin every Monday.    . Empagliflozin-metFORMIN HCl ER (SYNJARDY XR) 25-1000 MG TB24 Take 1 tablet by mouth daily before breakfast.     . ezetimibe (ZETIA) 10 MG tablet Take 10 mg by mouth daily.    . Insulin Glargine (BASAGLAR KWIKPEN) 100 UNIT/ML Inject 30 Units into the skin at bedtime.     [name] LINZESS 290 MCG CAPS capsule Take 290 mcg by mouth every morning.    . meloxicam (MOBIC) 15 MG tablet Take 15 mg by mouth daily.    . metFORMIN (GLUCOPHAGE) 1000 MG tablet Take 1,000 mg by mouth at bedtime.    . Nutritional Supplements (NUTRITIONAL SHAKE HIGH PROTEIN PO) Take 237 mLs by mouth 2 (two) times daily.    . ondansetron (ZOFRAN-ODT) 4 MG disintegrating tablet Take 4 mg by mouth every 8 (eight) hours as needed for nausea or vomiting.    . prazosin (MINIPRESS) 2 MG capsule Take 4 mg by mouth daily.    . pregabalin (LYRICA) 100 MG capsule TAKE 1 CAPSULE BY MOUTH THREE TIMES A DAY (Patient taking differently: Take 100 mg by mouth 3 (three) times daily.) 90 capsule 3  . QUEtiapine (SEROQUEL) 400 MG tablet Take 800 mg by mouth at bedtime.    . simvastatin (ZOCOR) 10 MG tablet Take 10 mg by mouth daily.  6  . topiramate (TOPAMAX) 200 MG tablet Take 200 mg by mouth 2 (two) times daily.    [name] vortioxetine HBr ([name]) 20 MG TABS tablet Take 20 mg by mouth at bedtime.    . ziprasidone ([name]) 20 MG capsule Take 20 mg by mouth at bedtime.    [name] amoxicillin-clavulanate (AUGMENTIN) 875-125 MG tablet Take 1 tablet by mouth 2 (two) times daily. (Patient taking differently: Take 1 tablet by mouth 2 (two) times daily. Start date : 06/13/20) 14 tablet 0  . [EXPIRED] ciprofloxacin (CIPRO) 500 MG tablet Take 1 tablet (500 mg total) by mouth 2 (two) times daily for 3 days. 6 tablet 0  . lisinopril (ZESTRIL) 20 MG tablet TAKE 1 TABLET BY MOUTH EVERY DAY (Patient not taking: No sig reported) 90 tablet 1  . pantoprazole (PROTONIX) 40 MG tablet Take 1 tablet (40 mg total) by mouth daily. (Patient not taking: No sig reported) 30 tablet 6     Physical Exam: Blood pressure 116/73, pulse 86, temperature (!) 97.5 F (36.4 C), temperature source Oral, resp. rate 16, height 5' 5"  (1.651 m), weight 72.6 kg, SpO2 97 %. General: pleasant, WD, WN white female who is laying in bed in NAD HEENT: head is  normocephalic, atraumatic.  Sclera are noninjected.  PERRL.  Ears and nose without any masses or lesions, PANDA tube in place.  Mouth is pink and moist Heart: regular, rate, and rhythm.  Normal s1,s2. No obvious murmurs, gallops, or rubs noted.  Palpable radial and pedal pulses bilaterally Lungs: CTAB, no wheezes, rhonchi, or rales noted.  Respiratory effort nonlabored Abd: soft, tender in epigastrium and RUQ, drain in place with minimal bilious output, ND, +BS, no masses, hernias, or organomegaly MS: all 4 extremities  are symmetrical with no cyanosis, clubbing, or edema. Skin: warm and dry with no masses, lesions, or rashes Neuro: Cranial nerves 2-12 grossly intact, sensation is normal throughout Psych: A&Ox3 with an appropriate affect.   Results for orders placed or performed during the hospital encounter of 06/27/20 (from the past 48 hour(s))  Glucose, capillary     Status: Abnormal   Collection Time: 07/01/20  3:37 PM  Result Value Ref Range   Glucose-Capillary 147 (H) 70 - 99 mg/dL    Comment: Glucose reference range applies only to samples taken after fasting for at least 8 hours.  Glucose, capillary     Status: Abnormal   Collection Time: 07/01/20  7:46 PM  Result Value Ref Range   Glucose-Capillary 168 (H) 70 - 99 mg/dL    Comment: Glucose reference range applies only to samples taken after fasting for at least 8 hours.  Glucose, capillary     Status: Abnormal   Collection Time: 07/01/20 11:58 PM  Result Value Ref Range   Glucose-Capillary 103 (H) 70 - 99 mg/dL    Comment: Glucose reference range applies only to samples taken after fasting for at least 8 hours.  Glucose, capillary     Status: None   Collection Time: 07/02/20  3:49 AM  Result Value Ref Range   Glucose-Capillary 94 70 - 99 mg/dL    Comment: Glucose reference range applies only to samples taken after fasting for at least 8 hours.  Comprehensive metabolic panel     Status: Abnormal   Collection Time: 07/02/20   4:46 AM  Result Value Ref Range   Sodium 140 135 - 145 mmol/L   Potassium 3.8 3.5 - 5.1 mmol/L   Chloride 109 98 - 111 mmol/L   CO2 21 (L) 22 - 32 mmol/L   Glucose, Bld 113 (H) 70 - 99 mg/dL    Comment: Glucose reference range applies only to samples taken after fasting for at least 8 hours.   BUN 9 6 - 20 mg/dL   Creatinine, Ser 0.62 0.44 - 1.00 mg/dL   Calcium 8.8 (L) 8.9 - 10.3 mg/dL   Total Protein 6.8 6.5 - 8.1 g/dL   Albumin 3.6 3.5 - 5.0 g/dL   AST 63 (H) 15 - 41 U/L   ALT 38 0 - 44 U/L   Alkaline Phosphatase 133 (H) 38 - 126 U/L   Total Bilirubin 0.6 0.3 - 1.2 mg/dL   GFR, Estimated >60 >60 mL/min    Comment: (NOTE) Calculated using the CKD-EPI Creatinine Equation (2021)    Anion gap 10 5 - 15    Comment: Performed at [facility], [place] [address], [place], [place] [address]  CBC     Status: Abnormal   Collection Time: 07/02/20  4:46 AM  Result Value Ref Range   WBC 4.5 4.0 - 10.5 K/uL   RBC 4.03 3.87 - 5.11 MIL/uL   Hemoglobin 9.8 (L) 12.0 - 15.0 g/dL   HCT 33.9 (L) 36.0 - 46.0 %   MCV 84.1 80.0 - 100.0 fL   MCH 24.3 (L) 26.0 - 34.0 pg   MCHC 28.9 (L) 30.0 - 36.0 g/dL   RDW 16.0 (H) 11.5 - 15.5 %   Platelets 133 (L) 150 - 400 K/uL   nRBC 0.0 0.0 - 0.2 %    Comment: Performed at [facility], [place] [address], [place], [place] [address]  Glucose, capillary     Status: None   Collection Time: 07/02/20  7:44 AM  Result Value Ref Range   Glucose-Capillary 82 70 - 99 mg/dL    Comment: Glucose reference range applies only to samples taken after fasting for at least 8 hours.  Glucose, capillary     Status: None   Collection Time: 07/02/20 11:42 AM  Result Value Ref Range   Glucose-Capillary 82 70 - 99 mg/dL    Comment: Glucose reference range applies only to samples taken after fasting for at least 8 hours.  Magnesium     Status: None   Collection Time: 07/02/20  2:01 PM  Result Value Ref Range   Magnesium 1.7 1.7 - 2.4 mg/dL     Comment: Performed at [facility], [place] [address], [place], [place] [address]  Phosphorus     Status: None   Collection Time: 07/02/20  2:01 PM  Result Value Ref Range   Phosphorus 3.0 2.5 - 4.6 mg/dL    Comment: Performed at [facility], [place] [address], [place], [place] [address]  Glucose, capillary     Status: Abnormal   Collection Time: 07/02/20  2:25 PM  Result Value Ref Range   Glucose-Capillary 67 (L) 70 - 99 mg/dL    Comment: Glucose reference range applies only to samples taken after fasting for at least 8 hours.  Glucose, capillary     Status: None   Collection Time: 07/02/20  2:53 PM  Result Value Ref Range   Glucose-Capillary 99 70 - 99 mg/dL    Comment: Glucose reference range applies only to samples taken after fasting for at least 8 hours.  Glucose, capillary     Status: Abnormal   Collection Time: 07/02/20  4:26 PM  Result Value Ref Range   Glucose-Capillary 120 (H) 70 - 99 mg/dL    Comment: Glucose reference range applies only to samples taken after fasting for at least 8 hours.  Magnesium     Status: None   Collection Time: 07/02/20  4:30 PM  Result Value Ref Range   Magnesium 1.7 1.7 - 2.4 mg/dL    Comment: Performed at [facility], [place] [address], [place], [place] [address]  Phosphorus     Status: None   Collection Time: 07/02/20  4:30 PM  Result Value Ref Range   Phosphorus 2.7 2.5 - 4.6 mg/dL    Comment: Performed at [facility], [place] [address], [place], [place] [address]  Glucose, capillary     Status: Abnormal   Collection Time: 07/02/20  7:57 PM  Result Value Ref Range   Glucose-Capillary 170 (H) 70 - 99 mg/dL    Comment: Glucose reference range applies only to samples taken after fasting for at least 8 hours.  Glucose, capillary     Status: Abnormal   Collection Time: 07/03/20 12:16 AM  Result Value Ref Range   Glucose-Capillary 132 (H) 70 - 99 mg/dL    Comment: Glucose  reference range applies only to samples taken after fasting for at least 8 hours.  Glucose, capillary     Status: Abnormal   Collection Time: 07/03/20  4:08 AM  Result Value Ref Range   Glucose-Capillary 113 (H) 70 - 99 mg/dL    Comment: Glucose reference range applies only to samples taken after fasting for at least 8 hours.  CBC     Status: Abnormal   Collection Time: 07/03/20  5:01 AM  Result Value Ref Range   WBC 4.7 4.0 - 10.5 K/uL   RBC 4.04 3.87 -  5.11 MIL/uL   Hemoglobin 9.9 (L) 12.0 - 15.0 g/dL   HCT 34.2 (L) 36.0 - 46.0 %   MCV 84.7 80.0 - 100.0 fL   MCH 24.5 (L) 26.0 - 34.0 pg   MCHC 28.9 (L) 30.0 - 36.0 g/dL   RDW 16.4 (H) 11.5 - 15.5 %   Platelets 146 (L) 150 - 400 K/uL   nRBC 0.0 0.0 - 0.2 %    Comment: Performed at [facility], [place] [address], [place], [place] [address]  Comprehensive metabolic panel     Status: Abnormal   Collection Time: 07/03/20  5:01 AM  Result Value Ref Range   Sodium 143 135 - 145 mmol/L   Potassium 4.0 3.5 - 5.1 mmol/L   Chloride 112 (H) 98 - 111 mmol/L   CO2 25 22 - 32 mmol/L   Glucose, Bld 126 (H) 70 - 99 mg/dL    Comment: Glucose reference range applies only to samples taken after fasting for at least 8 hours.   BUN 9 6 - 20 mg/dL   Creatinine, Ser 0.58 0.44 - 1.00 mg/dL   Calcium 8.8 (L) 8.9 - 10.3 mg/dL   Total Protein 6.5 6.5 - 8.1 g/dL   Albumin 3.5 3.5 - 5.0 g/dL   AST 67 (H) 15 - 41 U/L   ALT 40 0 - 44 U/L   Alkaline Phosphatase 129 (H) 38 - 126 U/L   Total Bilirubin 0.4 0.3 - 1.2 mg/dL   GFR, Estimated >60 >60 mL/min    Comment: (NOTE) Calculated using the CKD-EPI Creatinine Equation (2021)    Anion gap 6 5 - 15    Comment: Performed at [facility], [place] [address] [place], [place] [address]  Magnesium     Status: None   Collection Time: 07/03/20  5:01 AM  Result Value Ref Range   Magnesium 1.8 1.7 - 2.4 mg/dL    Comment: Performed at [facility], [place]  [address], [place], [place] [address]  Phosphorus     Status: None   Collection Time: 07/03/20  5:01 AM  Result Value Ref Range   Phosphorus 2.8 2.5 - 4.6 mg/dL    Comment: Performed at [facility], [place] [address] [place], [place] [address]  Glucose, capillary     Status: Abnormal   Collection Time: 07/03/20  7:38 AM  Result Value Ref Range   Glucose-Capillary 101 (H) 70 - 99 mg/dL    Comment: Glucose reference range applies only to samples taken after fasting for at least 8 hours.   DG Abd 1 View  Result Date: 07/02/2020 CLINICAL DATA:  Enteric tube placement EXAM: ABDOMEN - 1 VIEW COMPARISON:  April 11, 2019 FINDINGS: Enteric tube tip is at the level of the first portion of the duodenum. There is moderate stool throughout the colon. No bowel dilatation or air-fluid level to suggest bowel obstruction. No free air. There are apparent biliary stents in the right abdomen. There is an apparent drain in the lateral upper right abdomen. There are safety pins which presumably overlies the pelvis. There are tubal ligation clips bilaterally. IMPRESSION: Enteric tube tip at level of first portion of duodenum. No bowel obstruction or free air. Moderate stool in colon. Apparent biliary stents. Drain in right upper abdomen laterally noted. Electronically Signed   By: Lowella Grip III M.D.   On: 07/02/2020 15:24      Assessment/Plan HTN HLD Diabetes Cirrhosis   Persistent bile leak s/p fenestrated  lap chole March 2021 by Dr. Kae Heller The patient has undergone multiple ERCPs and treatment since March 2021 for this bile leak.  Unfortunately her leak persists.  She had a percutaneous drain placed in a biloma in January 2022 which has shown a decrease in output since her last ERCP.  It is unlikely that there is anything surgical to offer the patient at this time; however, we will reach out to our hepatobiliary specialist to get further recommendations.  Nutrition seems to be another  key issue for the patient.  It is unclear why she is having persistent pain with eating and nausea and unable to tolerate oral intake.  Her last lipase was in December 2021 and mildly elevated at 101.  It may be prudent to recheck this to determine if that may be an underlying etiology for her inability to take in oral nutrition.  Otherwise, in the interim agree with continuing nutritional supplementation via her PANDA tube.  Further recommendations after discussion with our hepatobiliary specialist.  We will follow with you.   Henreitta Cea, [facility] 07/03/2020, 11:35 AM Please see Amion for pager number during day hours 7:00am-4:30pm or 7:00am -11:30am on weekends

## 2020-07-03 NOTE — Progress Notes (Signed)
PROGRESS NOTE   Kim Hicks  NHA:[id number] DOB: 29-Nov-1965 DOA: 06/27/2020  PCP: [name], FNP    Brief Narrative: 55 year old female with history of liver cirrhosis from NASH, type 2 diabetes, type 2 diabetes mellitus, migraine, hyperlipidemia, hypertension, neuropathy, GERD, IBS, cholecystitis complicated by persistent bile leak who had ERCP for infected biloma, had stent placed x4 on 06/26/20.  She presented with complains of abdominal pain and chills.  Was found to have acute pancreatitis.  Was hospitalized for further management.  Gastroenterology was consulted.   Subjective: Patient continues to have significant abdominal discomfort. She mentioned that she ate grits this morning and developed worsening abdominal pain which is a 10 out of 10 in intensity currently. Feels nauseated.   Assessment & Plan:  Post ERCP pancreatitis/history of persistent bile leak/ s/p biliary stent placement Patient had significantly elevated lipase level of 1800.  Gastroenterology was consulted and they are following.   Abdomen remains tender. Nasogastric feeding tube was placed yesterday. Waiting on pump to become available before the feedings can be initiated.  Patient with a recent biliary stent placement. Was on ciprofloxacin. She has completed course of same.  Diabetes mellitus type 2, insulin-dependent At home she is on Basaglar 30 units nightly, Trulicity 4 mg every week and Synjardy, metformin.   CBGs were noted to be low due to her poor oral intake and so dose of Lantus was decreased. Will likely go up to once tube feedings initiated. Continue to monitor.  Normal anion gap metabolic acidosis Seems to have resolved.  Anemia of chronic disease Hemoglobin low but stable.  No evidence of overt blood loss.   Essential hypertension/hyperlipidemia Occasional high readings noted which could be due to pain.  Antihypertensives on hold currently.  GERD  On PPI.    Chronic  constipation Laxatives  Liver cirrhosis due to NASH Outpatient monitoring per GI.   DVT prophylaxis: Lovenox  Code Status: Full code Family Communication: Discussed with patient. Disposition: Hopefully return home when improved  Status is: Inpatient  Remains inpatient appropriate because:Ongoing active pain requiring inpatient pain management, IV treatments appropriate due to intensity of illness or inability to take PO and Inpatient level of care appropriate due to severity of illness   Dispo:  Patient From: Home  Planned Disposition: To be determined  Expected discharge date: 07/05/2020  Medically stable for discharge: No        Consultants: [facility] gastroenterology Procedures: None yet  Culture/Microbiology    Component Value Date/Time   SDES  05/23/2020 1557    ABSCESS GALLBLADDER FOSSA FLUID Performed at [facility], [place] [address], [place], [place] [address]    [place]  05/23/2020 1557    Normal Performed at [facility], [place] [address] [place], [place] [address]    CULT  05/23/2020 1557    RARE ENTEROCOCCUS FAECALIS NO ANAEROBES ISOLATED Performed at [facility], [place] [address] [place], [place] [address]    REPTSTATUS 05/28/2020 FINAL 05/23/2020 1557      Medications: Scheduled Meds: . brexpiprazole  2 mg Oral QHS  . enoxaparin (LOVENOX) injection  40 mg Subcutaneous Daily  . feeding supplement (PROSource TF)  45 mL Per Tube TID  . free water  100 mL Per Tube Q4H  . insulin aspart  0-9 Units Subcutaneous Q4H  . insulin glargine  5 Units Subcutaneous QHS  . ondansetron (ZOFRAN) IV  4 mg Intravenous Q6H  . pantoprazole  40 mg Oral QHS  . pregabalin  100 mg Oral TID  . QUEtiapine  800 mg Oral QHS  . senna  1 tablet Oral BID  . topiramate  200 mg Oral BID  . vortioxetine HBr  20 mg Oral QHS  . ziprasidone  20 mg Oral QHS   Continuous Infusions: . sodium chloride 100 mL/hr at 07/02/20 1825  . feeding  supplement (OSMOLITE 1.5 CAL)      Antimicrobials: Anti-infectives (From admission, onward)   Start     Dose/Rate Route Frequency Ordered Stop   06/27/20 2100  ciprofloxacin (CIPRO) IVPB 400 mg        400 mg 200 mL/hr over 60 Minutes Intravenous Every 12 hours 06/27/20 2057 07/02/20 0944     Objective: Vitals: Today's Vitals   07/03/20 0524 07/03/20 0820 07/03/20 0927 07/03/20 0957  BP: 116/73     Pulse: 86     Resp: 16     Temp: (!) 97.5 F (36.4 C)     TempSrc: Oral     SpO2: 97%     Weight:      Height:      PainSc:  8  7  5      Intake/Output Summary (Last 24 hours) at 07/03/2020 1138 Last data filed at 07/03/2020 1000 Gross per 24 hour  Intake 2791.16 ml  Output 3600 ml  Net -808.84 ml   Filed Weights   06/27/20 1552  Weight: 72.6 kg   Weight change:   Intake/Output from previous day: 02/08 0701 - 02/09 0700 In: 999.5 [P.O.:120; I.V.:879.5] Out: 3000 [Urine:2950; Drains:50] Intake/Output this shift: Total I/O In: 1871.2 [I.V.:1571.2; NG/GT:300] Out: 900 [Urine:900] Filed Weights   06/27/20 1552  Weight: 72.6 kg    Examination:  General appearance: Awake alert.  In no distress Resp: Clear to auscultation bilaterally.  Normal effort Cardio: S1-S2 is normal regular.  No S3-S4.  No rubs murmurs or bruit GI: Abdomen is soft. Remains tender in the epigastric area without any rebound rigidity or guarding. Bowel sounds present.  Extremities: No edema.  Full range of motion of lower extremities. Neurologic: Alert and oriented x3.  No focal neurological deficits.       Data Reviewed: I have personally reviewed following labs and imaging studies CBC: Recent Labs  Lab 06/27/20 1658 06/28/20 0446 06/29/20 0847 06/30/20 0550 07/01/20 0427 07/02/20 0446 07/03/20 0501  WBC 6.2 6.8 5.2 5.1 4.4 4.5 4.7  NEUTROABS 3.7 4.2  --   --   --   --   --   HGB 9.8* 9.3* 9.8* 9.4* 10.3* 9.8* 9.9*  HCT 33.5* 32.5* 34.1* 31.8* 35.8* 33.9* 34.2*  MCV 83.3 84.2 84.8  83.5 84.4 84.1 84.7  PLT 161 144* 132* 142* 149* 133* 242*   Basic Metabolic Panel: Recent Labs  Lab 06/28/20 0446 06/29/20 0847 06/30/20 0550 07/01/20 0427 07/02/20 0446 07/02/20 1401 07/02/20 1630 07/03/20 0501  NA 139 141 142 140 140  --   --  143  K 4.0 3.6 4.0 3.5 3.8  --   --  4.0  CL 112* 113* 111 109 109  --   --  112*  CO2 21* 21* 24 22 21*  --   --  25  GLUCOSE 186* 100* 128* 116* 113*  --   --  126*  BUN 16 13 11 9 9   --   --  9  CREATININE 0.43* 0.43* 0.52 0.43* 0.62  --   --  0.58  CALCIUM 8.6* 8.6* 8.7* 8.9 8.8*  --   --  8.8*  MG 1.8  --   --  1.7  --  1.7 1.7 1.8  PHOS 2.7  --   --   --   --  3.0 2.7 2.8   GFR: Estimated Creatinine Clearance: 79.3 mL/min (by C-G formula based on SCr of 0.58 mg/dL).  Liver Function Tests: Recent Labs  Lab 06/29/20 0847 06/30/20 0550 07/01/20 0427 07/02/20 0446 07/03/20 0501  AST 40 39 49* 63* 67*  ALT 26 24 31  38 40  ALKPHOS 119 118 128* 133* 129*  BILITOT 0.5 0.6 0.5 0.6 0.4  PROT 6.6 6.4* 7.2 6.8 6.5  ALBUMIN 3.4* 3.4* 3.7 3.6 3.5   Recent Labs  Lab 06/27/20 1658 06/28/20 0446 06/29/20 0847 06/30/20 0550 07/01/20 0427  LIPASE 1,155* 1,838* 1,052* 294* 59*   Coagulation Profile: Recent Labs  Lab 06/27/20 1700  INR 1.0   Cardiac Enzymes: Recent Labs  Lab 06/28/20 0446  CKTOTAL 22*   CBG: Recent Labs  Lab 07/02/20 1626 07/02/20 1957 07/03/20 0016 07/03/20 0408 07/03/20 0738  GLUCAP 120* 170* 132* 113* 101*   Sepsis Labs: Recent Labs  Lab 06/28/20 0446  LATICACIDVEN 0.7    Recent Results (from the past 240 hour(s))  SARS CORONAVIRUS 2 (TAT 6-24 HRS) Nasopharyngeal Nasopharyngeal Swab     Status: None   Collection Time: 06/27/20  8:37 PM   Specimen: Nasopharyngeal Swab  Result Value Ref Range Status   SARS Coronavirus 2 NEGATIVE NEGATIVE Final    Comment: (NOTE) SARS-CoV-2 target nucleic acids are NOT DETECTED.  The SARS-CoV-2 RNA is generally detectable in upper and  lower respiratory specimens during the acute phase of infection. Negative results do not preclude SARS-CoV-2 infection, do not rule out co-infections with other pathogens, and should not be used as the sole basis for treatment or other patient management decisions. Negative results must be combined with clinical observations, patient history, and epidemiological information. The expected result is Negative.  Fact Sheet for Patients: SugarRoll.be  Fact Sheet for Healthcare Providers: https://www.woods-mathews.com/  This test is not yet approved or cleared by the [place] FDA and  has been authorized for detection and/or diagnosis of SARS-CoV-2 by FDA under an Emergency Use Authorization (EUA). This EUA will remain  in effect (meaning this test can be used) for the duration of the COVID-19 declaration under Se ction 564(b)(1) of the Act, 21 U.S.C. section 360bbb-3(b)(1), unless the authorization is terminated or revoked sooner.  Performed at [facility], [place] [address] [place], [place] [address]      Radiology Studies: DG Abd 1 View  Result Date: 07/02/2020 CLINICAL DATA:  Enteric tube placement EXAM: ABDOMEN - 1 VIEW COMPARISON:  April 11, 2019 FINDINGS: Enteric tube tip is at the level of the first portion of the duodenum. There is moderate stool throughout the colon. No bowel dilatation or air-fluid level to suggest bowel obstruction. No free air. There are apparent biliary stents in the right abdomen. There is an apparent drain in the lateral upper right abdomen. There are safety pins which presumably overlies the pelvis. There are tubal ligation clips bilaterally. IMPRESSION: Enteric tube tip at level of first portion of duodenum. No bowel obstruction or free air. Moderate stool in colon. Apparent biliary stents. Drain in right upper abdomen laterally noted. Electronically Signed   By: Lowella Grip III M.D.   On: 07/02/2020  15:24     LOS: 5 days   Bonnielee Haff, MD [facility]  07/03/2020, 11:38 AM

## 2020-07-03 NOTE — Progress Notes (Addendum)
[facility] Progress Note  CC:  Post ERCP pancreatitis, persistent biliary leak s/p cholecystectomy 07/2019   Subjective: She continues to have the same epigastric and RUQ abdominal pain which is reduced after she receives Dilaudid 0.75m Q 4hrs and Oxycodone 551mpo Q 6hrs PRN.  She tolerated ice cream this am but had increased epigastric pain when she tried to eat grits. Scheduled Zofran 27m59mV Q 6hrs controls her nausea more than PRN dosing. No BM past 48 hours but passing gas per the rectum. She is urinating adequate amounts of clear yellow urine.    Vital signs in last 24 hours: Temp:  [97.5 F (36.4 C)-98.5 F (36.9 C)] 97.5 F (36.4 C) (02/09 0524) Pulse Rate:  [79-87] 86 (02/09 0524) Resp:  [16-18] 16 (02/09 0524) BP: (115-120)/(66-73) 116/73 (02/09 0524) SpO2:  [97 %-99 %] 97 % (02/09 0524) Last BM Date: 06/30/20 General:   Alert [age]ar old female in NAD. Heart: RRR, no murmur.  Pulm:  Breath sounds clear throughout.  Abdomen: Soft, mildly distended, moderate tenderness epigastric,  RUQ -> RLQ and periumbilical area without rebound or guarding. Positive BS x 4 quads. RUQ perc drain site and drsg intact. Small amount of light green bilious drainage in collection bag.  Extremities:  Without edema. Neurologic:  Alert and  oriented x4;  grossly normal neurologically. Psych:  Alert and cooperative. Normal mood and affect.  Lab Results: Recent Labs    07/01/20 0427 07/02/20 0446 07/03/20 0501  WBC 4.4 4.5 4.7  HGB 10.3* 9.8* 9.9*  HCT 35.8* 33.9* 34.2*  PLT 149* 133* 146*   BMET Recent Labs    07/01/20 0427 07/02/20 0446 07/03/20 0501  NA 140 140 143  K 3.5 3.8 4.0  CL 109 109 112*  CO2 22 21* 25  GLUCOSE 116* 113* 126*  BUN 9 9 9   CREATININE 0.43* 0.62 0.58  CALCIUM 8.9 8.8* 8.8*   LFT Recent Labs    07/03/20 0501  PROT 6.5  ALBUMIN 3.5  AST 67*  ALT 40  ALKPHOS 129*  BILITOT 0.4   DG Abd 1 View  Result Date: 07/02/2020 CLINICAL  DATA:  Enteric tube placement EXAM: ABDOMEN - 1 VIEW COMPARISON:  April 11, 2019 FINDINGS: Enteric tube tip is at the level of the first portion of the duodenum. There is moderate stool throughout the colon. No bowel dilatation or air-fluid level to suggest bowel obstruction. No free air. There are apparent biliary stents in the right abdomen. There is an apparent drain in the lateral upper right abdomen. There are safety pins which presumably overlies the pelvis. There are tubal ligation clips bilaterally. IMPRESSION: Enteric tube tip at level of first portion of duodenum. No bowel obstruction or free air. Moderate stool in colon. Apparent biliary stents. Drain in right upper abdomen laterally noted. Electronically Signed   By: WilLowella GripI M.D.   On: 07/02/2020 15:24    Assessment / Plan:  1. [age]ar old female s/p cholecystectomy 07/2019 with persistent bile leak s/p 5 ERCPs with most recent ERCP 06/26/2020 which showed a patent prior biliary sphincterotomy, one partially occluded migrated stent from the biliary tree was seen in the major papilla which was removed, main bile duct was dilated, the biliary tree was swept and sludge was found, biliary extravasation was noted in the cystic duct into the perc drain and 4 plastic bilary stents were placed in the common bile duct. She was admitted to the hospital 2/3 with  post ERCP pancreatitis. Lipase 1155. CTAP 2/3 showed perc drain catheter coiled in the gallbladder fossa without focal fluid collection, 4 biliary stents were intact. Percutaneous biliary drain output50 cc past 24 hrs. Biliary perc drainage bag/external tubing was changed by IR 2/8, perc drain was not flushed. Today Alk phos 129. AST 67. ALT 40. T. Bili 0.4. WBC 4.7. Lipase 59 on 2/7. She is afebrile and remains hemodynamically stable.  -Appreciate RD tube feeding recommendations, to start Osmolite 1.5 @ 12m/hr to advance by 10 ml every 8 hours to reach goal rate of 55 ml/hr with 45 ml  Prosource TF TID and 100 ml free water every 4 hours. Tube feedings to be started as soon as a feeding pump becomes available. Nursing staff and RD attempting to locate feeding pump.  -Continue IV fluids -Continue Zofran 466mIV scheduled every 6 hours -Pain management per the hospitalist  -CBC, CMP in am -Request general surgery consult regarding high output bile leak (perc drain output has decreased past few days) which has has failed to heal after 5 ERCPS and 1 year since the original injury.   -Further recommendations per Dr. JaArdis Hughs2. Cirrhosis, suspect NASH with splenomegaly and thrombocytopenia   3. Normocytic anemia. Stable Hg 9.9. No overt GI bleeding. -Repeat CBC in am  4. DM II  Further recommendations per Dr. JaArdis Hughs  LOS: 5 days   CoMAYGEN SIRICO2/01/2021, 10:07 AM   ________________________________________________________________________  LeVelora HecklerI MD note:  I personally examined the patient, reviewed the data and agree with the assessment and plan described above.  Nasoenteric tube placed for nutrition support, hopefully we'll be able to start TFs now that a pump was finally located (by CoSayreborrowing one from the ICU this afternoon).  Her drain output is definitely trending lower. Surgery input greatly appreciated and they plan to ask IR about slowly pulling the drain outward from GB fossa, with the idea that perhaps a fistulous communication has developed between the duct injury site and the drain which could contribute to a persistent leak. Overlying this complicated anatomic issue there is increasing concern for possible drug seeking behavior.  Will follow along.   DaOwens LofflerMD [facility] Pager [phone]

## 2020-07-04 ENCOUNTER — Inpatient Hospital Stay: Payer: Self-pay

## 2020-07-04 ENCOUNTER — Inpatient Hospital Stay (HOSPITAL_COMMUNITY): Payer: Medicare Other

## 2020-07-04 DIAGNOSIS — K859 Acute pancreatitis without necrosis or infection, unspecified: Secondary | ICD-10-CM | POA: Diagnosis not present

## 2020-07-04 DIAGNOSIS — T402X1A Poisoning by other opioids, accidental (unintentional), initial encounter: Secondary | ICD-10-CM | POA: Diagnosis not present

## 2020-07-04 DIAGNOSIS — R1011 Right upper quadrant pain: Secondary | ICD-10-CM | POA: Diagnosis not present

## 2020-07-04 DIAGNOSIS — L8990 Pressure ulcer of unspecified site, unspecified stage: Secondary | ICD-10-CM | POA: Insufficient documentation

## 2020-07-04 DIAGNOSIS — E119 Type 2 diabetes mellitus without complications: Secondary | ICD-10-CM | POA: Diagnosis not present

## 2020-07-04 DIAGNOSIS — Z794 Long term (current) use of insulin: Secondary | ICD-10-CM | POA: Diagnosis not present

## 2020-07-04 LAB — CBC
HCT: 24 % — ABNORMAL LOW (ref 36.0–46.0)
Hemoglobin: 6.7 g/dL — CL (ref 12.0–15.0)
MCH: 24.9 pg — ABNORMAL LOW (ref 26.0–34.0)
MCHC: 27.9 g/dL — ABNORMAL LOW (ref 30.0–36.0)
MCV: 89.2 fL (ref 80.0–100.0)
Platelets: 156 10*3/uL (ref 150–400)
RBC: 2.69 MIL/uL — ABNORMAL LOW (ref 3.87–5.11)
RDW: 16.6 % — ABNORMAL HIGH (ref 11.5–15.5)
WBC: 10.9 10*3/uL — ABNORMAL HIGH (ref 4.0–10.5)
nRBC: 0 % (ref 0.0–0.2)

## 2020-07-04 LAB — GLUCOSE, CAPILLARY
Glucose-Capillary: 143 mg/dL — ABNORMAL HIGH (ref 70–99)
Glucose-Capillary: 191 mg/dL — ABNORMAL HIGH (ref 70–99)
Glucose-Capillary: 244 mg/dL — ABNORMAL HIGH (ref 70–99)
Glucose-Capillary: 260 mg/dL — ABNORMAL HIGH (ref 70–99)
Glucose-Capillary: 311 mg/dL — ABNORMAL HIGH (ref 70–99)
Glucose-Capillary: 312 mg/dL — ABNORMAL HIGH (ref 70–99)
Glucose-Capillary: 316 mg/dL — ABNORMAL HIGH (ref 70–99)
Glucose-Capillary: 385 mg/dL — ABNORMAL HIGH (ref 70–99)

## 2020-07-04 LAB — COMPREHENSIVE METABOLIC PANEL
ALT: 38 U/L (ref 0–44)
AST: 61 U/L — ABNORMAL HIGH (ref 15–41)
Albumin: 3.6 g/dL (ref 3.5–5.0)
Alkaline Phosphatase: 128 U/L — ABNORMAL HIGH (ref 38–126)
Anion gap: 10 (ref 5–15)
BUN: 12 mg/dL (ref 6–20)
CO2: 19 mmol/L — ABNORMAL LOW (ref 22–32)
Calcium: 8.6 mg/dL — ABNORMAL LOW (ref 8.9–10.3)
Chloride: 112 mmol/L — ABNORMAL HIGH (ref 98–111)
Creatinine, Ser: 0.58 mg/dL (ref 0.44–1.00)
GFR, Estimated: >60 mL/min (ref >60)
Glucose, Bld: 194 mg/dL — ABNORMAL HIGH (ref 70–99)
Potassium: 3.7 mmol/L (ref 3.5–5.1)
Sodium: 141 mmol/L (ref 135–145)
Total Bilirubin: 0.4 mg/dL (ref 0.3–1.2)
Total Protein: 6.8 g/dL (ref 6.5–8.1)

## 2020-07-04 LAB — PROCALCITONIN: Procalcitonin: 20.4 ng/mL

## 2020-07-04 LAB — LACTIC ACID, PLASMA
Lactic Acid, Venous: 3.3 mmol/L (ref 0.5–1.9)
Lactic Acid, Venous: 1.2 mmol/L (ref 0.5–1.9)

## 2020-07-04 LAB — MRSA PCR SCREENING: MRSA by PCR: NEGATIVE

## 2020-07-04 LAB — ABO/RH: ABO/RH(D): O POS

## 2020-07-04 LAB — PREPARE RBC (CROSSMATCH)

## 2020-07-04 MED ORDER — PREGABALIN 100 MG PO CAPS
100.0000 mg | ORAL_CAPSULE | Freq: Three times a day (TID) | ORAL | Status: DC
  Administered 2020-07-05: 100 mg via ORAL
  Filled 2020-07-04: qty 1

## 2020-07-04 MED ORDER — QUETIAPINE FUMARATE 200 MG PO TABS: 800.0000 mg | ORAL_TABLET | Freq: Every day | ORAL | Status: DC

## 2020-07-04 MED ORDER — SODIUM CHLORIDE 0.9% FLUSH
10.0000 mL | INTRAVENOUS | Status: DC | PRN
  Administered 2020-07-10: 10 mL

## 2020-07-04 MED ORDER — NOREPINEPHRINE 4 MG/250ML-% IV SOLN: 0.0000 ug/min | INTRAVENOUS | Status: DC

## 2020-07-04 MED ORDER — ORAL CARE MOUTH RINSE
15.0000 mL | Freq: Two times a day (BID) | OROMUCOSAL | Status: DC
  Administered 2020-07-04 – 2020-07-12 (×16): 15 mL via OROMUCOSAL

## 2020-07-04 MED ORDER — SODIUM CHLORIDE 0.9% IV SOLUTION: Freq: Once | INTRAVENOUS | Status: AC

## 2020-07-04 MED ORDER — INSULIN GLARGINE 100 UNIT/ML ~~LOC~~ SOLN
10.0000 [IU] | Freq: Once | SUBCUTANEOUS | Status: AC
  Administered 2020-07-04: 10 [IU] via SUBCUTANEOUS
  Filled 2020-07-04: qty 0.1

## 2020-07-04 MED ORDER — SODIUM CHLORIDE 0.9 % IV BOLUS
500.0000 mL | Freq: Once | INTRAVENOUS | Status: AC
  Administered 2020-07-04: 500 mL via INTRAVENOUS

## 2020-07-04 MED ORDER — VANCOMYCIN HCL 1500 MG/300ML IV SOLN
1500.0000 mg | Freq: Once | INTRAVENOUS | Status: DC
  Filled 2020-07-04: qty 300

## 2020-07-04 MED ORDER — NALOXONE HCL 0.4 MG/ML IJ SOLN
0.4000 mg | INTRAMUSCULAR | Status: DC | PRN
  Administered 2020-07-04 (×3): 0.4 mg via INTRAVENOUS
  Filled 2020-07-04 (×3): qty 1

## 2020-07-04 MED ORDER — VANCOMYCIN HCL 1750 MG/350ML IV SOLN
1750.0000 mg | INTRAVENOUS | Status: DC
  Administered 2020-07-04: 1750 mg via INTRAVENOUS
  Filled 2020-07-04: qty 350

## 2020-07-04 MED ORDER — ZIPRASIDONE HCL 20 MG PO CAPS: 20.0000 mg | ORAL_CAPSULE | Freq: Every day | ORAL | Status: DC

## 2020-07-04 MED ORDER — SODIUM CHLORIDE 0.9 % IV SOLN: 250.0000 mL | INTRAVENOUS | Status: DC

## 2020-07-04 MED ORDER — SODIUM CHLORIDE 0.9% FLUSH
10.0000 mL | Freq: Two times a day (BID) | INTRAVENOUS | Status: DC
  Administered 2020-07-04 – 2020-07-07 (×6): 10 mL
  Administered 2020-07-08 – 2020-07-09 (×2): 30 mL
  Administered 2020-07-09: 20 mL
  Administered 2020-07-10: 30 mL
  Administered 2020-07-10 – 2020-07-11 (×2): 10 mL
  Administered 2020-07-11: 30 mL
  Administered 2020-07-12 – 2020-07-13 (×2): 10 mL

## 2020-07-04 MED ORDER — LACTATED RINGERS IV BOLUS
1000.0000 mL | Freq: Once | INTRAVENOUS | Status: AC
  Administered 2020-07-04: 1000 mL via INTRAVENOUS

## 2020-07-04 MED ORDER — NALOXONE HCL 0.4 MG/ML IJ SOLN
INTRAMUSCULAR | Status: AC
  Filled 2020-07-04: qty 1

## 2020-07-04 MED ORDER — VORTIOXETINE HBR 20 MG PO TABS: 20.0000 mg | ORAL_TABLET | Freq: Every day | ORAL | Status: DC

## 2020-07-04 MED ORDER — PIPERACILLIN-TAZOBACTAM 3.375 G IVPB
3.3750 g | Freq: Three times a day (TID) | INTRAVENOUS | Status: AC
  Administered 2020-07-04 – 2020-07-10 (×20): 3.375 g via INTRAVENOUS
  Filled 2020-07-04 (×20): qty 50

## 2020-07-04 MED ORDER — INSULIN GLARGINE 100 UNIT/ML ~~LOC~~ SOLN
10.0000 [IU] | Freq: Every day | SUBCUTANEOUS | Status: DC
  Administered 2020-07-04: 10 [IU] via SUBCUTANEOUS
  Filled 2020-07-04: qty 0.1

## 2020-07-04 MED ORDER — LACTATED RINGERS IV BOLUS: 1000.0000 mL | Freq: Once | INTRAVENOUS | Status: DC

## 2020-07-04 MED ORDER — MAGNESIUM HYDROXIDE 400 MG/5ML PO SUSP
30.0000 mL | Freq: Two times a day (BID) | ORAL | Status: AC
  Administered 2020-07-04: 30 mL
  Filled 2020-07-04: qty 30

## 2020-07-04 MED ORDER — BISACODYL 10 MG RE SUPP
10.0000 mg | Freq: Every day | RECTAL | Status: DC
  Filled 2020-07-04 (×3): qty 1

## 2020-07-04 MED ORDER — NOREPINEPHRINE 4 MG/250ML-% IV SOLN
INTRAVENOUS | Status: AC
  Administered 2020-07-04: 2 ug/min via INTRAVENOUS
  Filled 2020-07-04: qty 250

## 2020-07-04 MED ORDER — CHLORHEXIDINE GLUCONATE CLOTH 2 % EX PADS
6.0000 | MEDICATED_PAD | Freq: Every day | CUTANEOUS | Status: DC
  Administered 2020-07-04 – 2020-07-13 (×10): 6 via TOPICAL

## 2020-07-04 MED ORDER — PROCHLORPERAZINE EDISYLATE 10 MG/2ML IJ SOLN
10.0000 mg | Freq: Four times a day (QID) | INTRAMUSCULAR | Status: DC | PRN
  Administered 2020-07-04: 10 mg via INTRAVENOUS
  Filled 2020-07-04: qty 2

## 2020-07-04 MED ORDER — NALOXONE HCL 0.4 MG/ML IJ SOLN
INTRAMUSCULAR | Status: AC
  Administered 2020-07-04: 0.4 mg
  Filled 2020-07-04: qty 1

## 2020-07-04 MED ORDER — SENNOSIDES 8.8 MG/5ML PO SYRP
5.0000 mL | ORAL_SOLUTION | Freq: Every day | ORAL | Status: DC
  Administered 2020-07-04 – 2020-07-07 (×2): 5 mL via ORAL
  Filled 2020-07-04 (×6): qty 5

## 2020-07-04 MED ORDER — SODIUM CHLORIDE 0.9 % IV SOLN
INTRAVENOUS | Status: DC | PRN
  Administered 2020-07-04: 500 mL via INTRAVENOUS
  Administered 2020-07-05: 250 mL via INTRAVENOUS

## 2020-07-04 MED ORDER — LIP MEDEX EX OINT
TOPICAL_OINTMENT | CUTANEOUS | Status: AC
  Filled 2020-07-04: qty 7

## 2020-07-04 MED ORDER — PANTOPRAZOLE SODIUM 40 MG PO PACK
40.0000 mg | PACK | Freq: Every day | ORAL | Status: DC
  Administered 2020-07-04 – 2020-07-09 (×6): 40 mg
  Filled 2020-07-04 (×6): qty 20

## 2020-07-04 MED ORDER — TOPIRAMATE 100 MG PO TABS: 200.0000 mg | ORAL_TABLET | Freq: Two times a day (BID) | ORAL | Status: DC

## 2020-07-04 MED ORDER — NOREPINEPHRINE 4 MG/250ML-% IV SOLN
2.0000 ug/min | INTRAVENOUS | Status: DC
  Administered 2020-07-04: 5.333 ug/min via INTRAVENOUS

## 2020-07-04 NOTE — Progress Notes (Signed)
Peripherally Inserted Central Catheter Placement  The IV Nurse has discussed with the patient and/or persons authorized to consent for the patient, the purpose of this procedure and the potential benefits and risks involved with this procedure.  The benefits include less needle sticks, lab draws from the catheter, and the patient may be discharged home with the catheter. Risks include, but not limited to, infection, bleeding, blood clot (thrombus formation), and puncture of an artery; nerve damage and irregular heartbeat and possibility to perform a PICC exchange if needed/ordered by physician.  Alternatives to this procedure were also discussed.  Bard Power PICC patient education guide, fact sheet on infection prevention and patient information card has been provided to patient /or left at bedside.  Consent signed by husband   PICC Placement Documentation  PICC Triple Lumen 07/04/20 PICC Right Brachial 39 cm 0 cm (Active)  Indication for Insertion or Continuance of Line Vasoactive infusions 07/04/20 1701  Exposed Catheter (cm) 0 cm 07/04/20 1701  Site Assessment Clean;Dry;Intact 07/04/20 1701  Lumen #1 Status Flushed;Saline locked;Blood return noted 07/04/20 1701  Lumen #2 Status Flushed;Saline locked;Blood return noted 07/04/20 1701  Lumen #3 Status Flushed;Saline locked;Blood return noted 07/04/20 1701  Dressing Type Transparent 07/04/20 1701  Dressing Status Clean;Dry;Intact 07/04/20 1701  Antimicrobial disc in place? Yes 07/04/20 1701  Dressing Intervention New dressing 07/04/20 1701  Dressing Change Due 07/11/20 07/04/20 1701       Gordan Payment 07/04/2020, 5:02 PM

## 2020-07-04 NOTE — Progress Notes (Signed)
Claiborne Billings PA from [place] notified writer patient is grunting in pain. Writer went in patient's room. Patient c/o pain 10 out of 10 in her Abdomen. Writer gave 0.5m Dilaudid. MD Dr. KMaryland Pinksaw patient. MD notified writer to hold giving patient any more Dilaudid she was sleepy. MD order to put patient on a continuous pulse ox. continuous pulse ox ordered by receptionist. Receptionist notified writer that PTye SavoyNP wants to see wProbation officerin patient's room. Writer went in patient's room. Patient lethargic. Her oxygen sat's in 70's. oxygen applied on patient. Rapid Nurse called. Rapid Nurse came in the patient's room. Dr. KMaryland Pinkcame in patient's room. Patient moved to ICU. Writer attempted to call patient's husband twice no answer. Left a voicemail to cal back

## 2020-07-04 NOTE — Progress Notes (Signed)
CRITICAL VALUE ALERT  Critical Value:  HGB 6.7  Date & Time Notied:  07/04/2020  1630  Provider Notified: Dr. Bonnielee Haff  Orders Received/Actions taken: Awaiting new orders.

## 2020-07-04 NOTE — Progress Notes (Addendum)
IR was requested by [facility] to exchange and down size the biliary drain tube today.  Went up to ICU to obtain consent, but patient now appears to be lethargic, not oriented, tachyphemic, tachycardic, and hypotensive.  The procedure, benefit, and risk were explained to paitnt's husband at the bedside.  Consent was obtained from patient's husband.   Tentatively scheduled for this afternoon if patient condition allows.   ADDENDUM On hold till patient condition improves per Dr. Vernard Gambles.  Will continue to follow.

## 2020-07-04 NOTE — Progress Notes (Signed)
CRITICAL VALUE ALERT  Critical Value:  Lactic acid 3.3  Date & Time Notied:  07/04/2020  1636  Provider Notified: Dr. Bonnielee Haff   Orders Received/Actions taken: Awaiting new orders

## 2020-07-04 NOTE — Significant Event (Signed)
Rapid Response Event Note   Reason for Call :  Pt with AMS and found to be satting 70% when continuous pulse ox applied. Rapid response called and recommended NRB be placed and would be up to assess.  Initial Focused Assessment:  Pt resting in bed grunting at the end of expiration. Nasal canula on at 6L and O2 saturations in the 70s. Rapid response RN applied NRB with improvement in O2 saturations to the 90s. Pt responds to voice but unable to hold head up or keep eyes open. Pt states no pain. Bedside RN states 0.24m IV dilaudid given at 0830. Narcan given with improvement in mentation. Pt has cortrak tube noted to be longer than usual measuring 35 cm at the corner of the nare. Tube feed infusing at 45cc/hr. Tube feeds stopped. Original documentation of cortrak shows placement at 80cm at the corner of the nare. Lungs clear to auscultation. Pt noted to be hypotensive. NS bolus started.  Initial Vitals HR 118 ST BP 86/47 (60) O2 74% on [place] -> 92% NRB Respirations 28/min    Interventions:  NRB applied Narcan 0.451mIV given Tube feeds stopped, KUB and CXR ordered. NS bolus for hypotension MD notified and pt transferred to SDU for closer monitoring.   Plan of Care:  Pt transferred to SDU for closer monitoring and stabilization CXR shows feeding tube overlies the left main bronchus and could be in the mid esophagus or in the airway. Cortrak removed and replaced by rapid response RN once in SDU.  Event Summary:   MD Notified: KrMaryland PinkMD Call Time: 09Socorroime: 0940 End Time: 10[place]arker Ten MileRN

## 2020-07-04 NOTE — Progress Notes (Signed)
Pharmacy Antibiotic Note  Kim Hicks is a 55 y.o. female admitted on 06/27/2020 with abdominal pain, bile leak, s/p ERCP with stenting on 2/2.  On 2/10 she developed somnolence requiring Narcan, hypoxia, hypotension, fever, and CXR with possible pneumonia.  Pharmacy has been consulted for Vancomycin and Zosyn dosing.  Plan: Zosyn 3.375g IV Q8H infused over 4hrs. Vancomycin 1750 mg IV q24h Follow up renal function, culture results, and clinical course.    Height: 5' 4"  (162.6 cm) Weight: 79.7 kg (175 lb 11.3 oz) IBW/kg (Calculated) : 54.7  Temp (24hrs), Avg:99.2 F (37.3 C), Min:98 F (36.7 C), Max:102 F (38.9 C)  Recent Labs  Lab 06/28/20 0446 06/29/20 0847 06/30/20 0550 07/01/20 0427 07/02/20 0446 07/03/20 0501 07/04/20 0508  WBC 6.8 5.2 5.1 4.4 4.5 4.7  --   CREATININE 0.43* 0.43* 0.52 0.43* 0.62 0.58 0.58  LATICACIDVEN 0.7  --   --   --   --   --   --     Estimated Creatinine Clearance: 81.2 mL/min (by C-G formula based on SCr of 0.58 mg/dL).    Allergies  Allergen Reactions  . Talwin [Pentazocine]     Turned blue around lips and face, rash all over body    Antimicrobials this admission: 2/3 Cipro >> 2/8 2/10 Vancomycin >> 2/10 Zosyn >>  Dose adjustments this admission:   Microbiology results: 2/3 Covid: neg 2/10 MRSA PCR:  2/10 BCx:   Thank you for allowing pharmacy to be a part of this patient's care.  Gretta Arab PharmD, BCPS Clinical Pharmacist [facility] main pharmacy [phone] 07/04/2020 12:27 PM

## 2020-07-04 NOTE — Consult Note (Signed)
NAME:  Kim Hicks, MRN:  [id number], DOB:  1966-05-07, LOS: 6 ADMISSION DATE:  06/27/2020, CONSULTATION DATE: 07/04/2020 REFERRING MD: Dr. Pricilla Handler, CHIEF COMPLAINT: Hypertension altered mental status  Brief History:  55 year old female abdominal and low back pain with recent diagnosis of infected bowel.  She has been followed by infectious disease and GI.  PCCM consult 2/10 due to hypotension and somnolence.  History of Present Illness:  Kim Hicks is a 55 year old female with an extensive past medical history significant for but not limited to cirrhosis from NASH, cholecystitis complicated by persistent bile leak and infected biloma, type 2 diabetes hyperlipidemia, hypertension, migraines, neuropathy, GERD, and IBS who presented for acute complaints of abdominal and low back pain that began after outpatient ERCP performed 2/2.  Outpatient GI recommended patient presented to the ED for further evaluation.  Patient has known persistent bile leak after undergoing laparoscopic cholecystectomy 08/04/2019 for chronic cholecystitis.  She has underwent multiple ERCPs and stent placement/stent exchanges.  Most recent stent exchange was 05/27/2020 by ERCP.    Morning of 2/10 patient was seen somnolent with associated hypotension and hypoxia.  This prompted rapid response called and administration of Narcan given recent dose of Dilaudid.  Mentation improved with Narcan dosing.  Blood pressure slightly improved with fluid bolus.  PCCM consulted for further management.  Past Medical History:  Cirrhosis from NASH, cholecystitis complicated by persistent bile leak and infected biloma, type 2 diabetes hyperlipidemia, hypertension, migraines, neuropathy, GERD, and IBS   Significant Hospital Events:  Admitted 2/3 > for abdominal and back pain  Consults:  GI   Procedures:  PTA  ERCP 2/2 > 1 partially occluded migrated stent with moderate main bile duct dilation and 4 plastic biliary stents were placed within  the common bile duct  Significant Diagnostic Tests:  CT abdomen and pelvis 2/3 >  Percutaneous drainage catheter coiled in the gallbladder fossa without focal fluid collection. New 4 biliary stents extending from the porta hepatis to the duodenum.  Large colonic stool burden   Micro Data:  COVID 2/3 > negative Blood cultures >  MRSA PCR 2/10 > negative  Antimicrobials:  Ceftriaxone 2/2 > 2/10 Zosyn 2/10 > Vancomycin 2/10  Interim History / Subjective:  Lying in bed slightly sleepy but easily arouses to verbal stimuli.   Remains hypertensive and mildly tachycardic  Objective   Blood pressure (!) 86/35, pulse (!) 108, temperature (!) 102 F (38.9 C), temperature source Axillary, resp. rate (!) 25, height 5' 4"  (1.626 m), weight 79.7 kg, SpO2 92 %.        Intake/Output Summary (Last 24 hours) at 07/04/2020 1241 Last data filed at 07/04/2020 1200 Gross per 24 hour  Intake 5455.01 ml  Output 2415 ml  Net 3040.01 ml   Filed Weights   06/27/20 1552 07/04/20 1045  Weight: 72.6 kg 79.7 kg    Examination: General: Acute on chronic ill-appearing middle-aged female lying in bed in no acute distress HEENT: [place]/AT, core track in place, MM pink/moist, PERRL,  Neuro: Alert and oriented to self only, nonfocal, lethargic CV: s1s2 regular rate and rhythm, no murmur, rubs, or gallops,  PULM: Clear to auscultation bilaterally but diminished in bases, no increased work of breathing, oxygen saturations low to mid 90s on 4 L nasal cannula GI: soft, bowel sounds active in all 4 quadrants, non-tender, non-distended, tolerating TF Extremities: warm/dry, no edema  Skin: no rashes or lesions  Resolved Hospital Problem list     Assessment & Plan:  Concern for  developing sepsis -Morning of 2/10 patient developed fever of 102, tachypnea, tachycardia, hypotension P: ICU Continue supplemental oxygen Follow cultures  IV antibiotics broadened to vancomycin Zosyn Received IV resuscitation prior to  PCCM consult Follow blood pressure closely May need vasopressor support Obtain lactic acid and procalcitonin Monitor urine output  AMS with somnolence -Likely multifactorial including likely opioid overdose coupled with acute illness. -Improved with Narcan dosing P: Supportive care Minimize opioid administration Frequent neurochecks Delirium precautions  Post ERCP pancreatitis History of persistent bile leak with multiple biliary stents/exchanges -Patient has had longstanding history of above since original cholecystectomy 07/2019 Hx of liver cirrhosis secondary to NASH P: Primary management per GI, appreciate assistance Continue tube feeds IR pigtail drain placed in gallbladder fossa remains in place Supportive care Pain control remains an issue attempt to control pain with nonopioid options  Insulin-dependent type 2 diabetes -Home medications include Basaglar, Trulicity,  Metformin and Synjardy P: SSI  Continue lantus   Anemia of chronic disease  P: Stable  Trend CBC   GERD  P: Continue PPI  Best practice (evaluated daily)  Diet: Tube feeds  Pain/Anxiety/Delirium protocol (if indicated): As needed  VAP protocol (if indicated): N/A DVT prophylaxis: Lovenox GI prophylaxis: PPI Glucose control: SSI Mobility: Bedrest  Disposition:ICU  Goals of Care:  Last date of multidisciplinary goals of care discussion:Pending  Family and staff present: Pending  Summary of discussion: Pending Follow up goals of care discussion due: Pending  Code Status: Full  Labs   CBC: Recent Labs  Lab 06/27/20 1658 06/28/20 0446 06/29/20 0847 06/30/20 0550 07/01/20 0427 07/02/20 0446 07/03/20 0501  WBC 6.2 6.8 5.2 5.1 4.4 4.5 4.7  NEUTROABS 3.7 4.2  --   --   --   --   --   HGB 9.8* 9.3* 9.8* 9.4* 10.3* 9.8* 9.9*  HCT 33.5* 32.5* 34.1* 31.8* 35.8* 33.9* 34.2*  MCV 83.3 84.2 84.8 83.5 84.4 84.1 84.7  PLT 161 144* 132* 142* 149* 133* 146*    Basic Metabolic Panel: Recent  Labs  Lab 06/28/20 0446 06/29/20 0847 06/30/20 0550 07/01/20 0427 07/02/20 0446 07/02/20 1401 07/02/20 1630 07/03/20 0501 07/04/20 0508  NA 139   < > 142 140 140  --   --  143 141  K 4.0   < > 4.0 3.5 3.8  --   --  4.0 3.7  CL 112*   < > 111 109 109  --   --  112* 112*  CO2 21*   < > 24 22 21*  --   --  25 19*  GLUCOSE 186*   < > 128* 116* 113*  --   --  126* 194*  BUN 16   < > 11 9 9   --   --  9 12  CREATININE 0.43*   < > 0.52 0.43* 0.62  --   --  0.58 0.58  CALCIUM 8.6*   < > 8.7* 8.9 8.8*  --   --  8.8* 8.6*  MG 1.8  --   --  1.7  --  1.7 1.7 1.8  --   PHOS 2.7  --   --   --   --  3.0 2.7 2.8  --    < > = values in this interval not displayed.   GFR: Estimated Creatinine Clearance: 81.2 mL/min (by C-G formula based on SCr of 0.58 mg/dL). Recent Labs  Lab 06/28/20 0446 06/29/20 0847 06/30/20 0550 07/01/20 0427 07/02/20 0446 07/03/20 0501  WBC 6.8   < >  5.1 4.4 4.5 4.7  LATICACIDVEN 0.7  --   --   --   --   --    < > = values in this interval not displayed.    Liver Function Tests: Recent Labs  Lab 06/30/20 0550 07/01/20 0427 07/02/20 0446 07/03/20 0501 07/04/20 0508  AST 39 49* 63* 67* 61*  ALT 24 31 38 40 38  ALKPHOS 118 128* 133* 129* 128*  BILITOT 0.6 0.5 0.6 0.4 0.4  PROT 6.4* 7.2 6.8 6.5 6.8  ALBUMIN 3.4* 3.7 3.6 3.5 3.6   Recent Labs  Lab 06/28/20 0446 06/29/20 0847 06/30/20 0550 07/01/20 0427 07/03/20 0507  LIPASE 1,838* 1,052* 294* 59* 34   No results for input(s): AMMONIA in the last 168 hours.  ABG No results found for: PHART, PCO2ART, PO2ART, HCO3, TCO2, ACIDBASEDEF, O2SAT   Coagulation Profile: Recent Labs  Lab 06/27/20 1700  INR 1.0    Cardiac Enzymes: Recent Labs  Lab 06/28/20 0446  CKTOTAL 22*    HbA1C: Hgb A1c MFr Bld  Date/Time Value Ref Range Status  06/27/2020 05:00 PM 8.9 (H) 4.8 - 5.6 % Final    Comment:    (NOTE) Pre diabetes:          5.7%-6.4%  Diabetes:              >6.4%  Glycemic control for    <7.0% adults with diabetes   05/21/2020 05:06 AM 8.2 (H) 4.8 - 5.6 % Final    Comment:    (NOTE) Pre diabetes:          5.7%-6.4%  Diabetes:              >6.4%  Glycemic control for   <7.0% adults with diabetes     CBG: Recent Labs  Lab 07/03/20 2021 07/04/20 0003 07/04/20 0422 07/04/20 0807 07/04/20 1123  GLUCAP 260* 143* 191* 316* 385*    Review of Systems:   Unable to gather   Past Medical History:  She,  has a past medical history of Anxiety, Cataract, Cirrhosis of liver ([place]), Depression, Diabetes mellitus without complication ([place]), Dysrhythmia, Edema, Family history of adverse reaction to anesthesia, Foot fracture, left, Headache, Hyperlipidemia, Hypertension, Neuromuscular disorder ([place]), Palpitations, and Wheezing.   Surgical History:   Past Surgical History:  Procedure Laterality Date  . ABDOMINAL HYSTERECTOMY     one ovary left  . BILIARY STENT PLACEMENT N/A 08/07/2019   Procedure: BILIARY STENT PLACEMENT;  Surgeon: [name], MD;  Location: [facility];  Service: Endoscopy;  Laterality: N/A;  . BILIARY STENT PLACEMENT N/A 11/07/2019   Procedure: BILIARY STENT PLACEMENT;  Surgeon: [name], MD;  Location: [facility];  Service: Endoscopy;  Laterality: N/A;  . BILIARY STENT PLACEMENT N/A 05/13/2020   Procedure: BILIARY STENT PLACEMENT;  Surgeon: [name], MD;  Location: [facility];  Service: Endoscopy;  Laterality: N/A;  . BILIARY STENT PLACEMENT N/A 05/27/2020   Procedure: BILIARY STENT PLACEMENT;  Surgeon: [name], MD;  [facility]: [facility];  Service: Endoscopy;  Laterality: N/A;  . BILIARY STENT PLACEMENT N/A 06/26/2020   Procedure: BILIARY STENT PLACEMENT;  Surgeon: [name], MD;  Location: [facility];  Service: Gastroenterology;  Laterality: N/A;  . BIOPSY  06/26/2020   Procedure: BIOPSY;  Surgeon: [name], MD;  Location: [facility];  Service: Gastroenterology;;  . CATARACT EXTRACTION  W/PHACO Right 01/11/2018   Procedure: CATARACT EXTRACTION PHACO AND INTRAOCULAR LENS PLACEMENT ([place]);  Surgeon: [name], MD;  Location: [facility];  Service: Ophthalmology;  Laterality: Right;  [place] 00:57.5 AP% 15.0 CDE 8.61 Fluid Pack Lot # [id number]  . CATARACT EXTRACTION W/PHACO Left 02/15/2018   Procedure: CATARACT EXTRACTION PHACO AND INTRAOCULAR LENS PLACEMENT ([facility]);  Surgeon: [name], MD;  Location: [facility];  Service: Ophthalmology;  Laterality: Left;  [place] 00:51 AP% 13.2 CDE 6.82 Fluid p ack lot # [id number]  . CHOLECYSTECTOMY N/A 08/04/2019   Procedure: LAPAROSCOPIC CHOLECYSTECTOMY;  Surgeon: [name], MD;  Location: [facility];  Service: General;  Laterality: N/A;  . COLONOSCOPY    . ENDOSCOPIC RETROGRADE CHOLANGIOPANCREATOGRAPHY (ERCP) WITH PROPOFOL N/A 11/07/2019   Procedure: ENDOSCOPIC RETROGRADE CHOLANGIOPANCREATOGRAPHY (ERCP) WITH PROPOFOL;  Surgeon: [name], MD;  [facility]: [facility];  Service: Endoscopy;  Laterality: N/A;  . ENDOSCOPIC RETROGRADE CHOLANGIOPANCREATOGRAPHY (ERCP) WITH PROPOFOL N/A 05/13/2020   Procedure: ENDOSCOPIC RETROGRADE CHOLANGIOPANCREATOGRAPHY (ERCP) WITH PROPOFOL;  Surgeon: [name], MD;  [facility];  Service: Endoscopy;  Laterality: N/A;  . ENDOSCOPIC RETROGRADE CHOLANGIOPANCREATOGRAPHY (ERCP) WITH PROPOFOL N/A 06/26/2020   Procedure: ENDOSCOPIC RETROGRADE CHOLANGIOPANCREATOGRAPHY (ERCP) WITH PROPOFOL;  Surgeon: [name], MD;  Location: [facility];  Service: Gastroenterology;  Laterality: N/A;  . ERCP N/A 08/07/2019   Procedure: ENDOSCOPIC RETROGRADE CHOLANGIOPANCREATOGRAPHY (ERCP);  Surgeon: [name], MD;  Location: [facility];  Service: Endoscopy;  Laterality: N/A;  . ERCP N/A 05/27/2020   Procedure: ENDOSCOPIC RETROGRADE CHOLANGIOPANCREATOGRAPHY (ERCP);  Surgeon: [name], MD;  Location: [facility];  Service: Endoscopy;  Laterality: N/A;  . ESOPHAGOGASTRODUODENOSCOPY N/A  06/26/2020   Procedure: ESOPHAGOGASTRODUODENOSCOPY (EGD);  Surgeon: [name]., MD;  Location: [facility];  Service: Gastroenterology;  Laterality: N/A;  . IR RADIOLOGIST EVAL & MGMT  06/20/2020  . RCR Bilateral   . REMOVAL OF STONES  08/07/2019   Procedure: REMOVAL OF STONES;  Surgeon: [name], MD;  [facility]: [facility];  Service: Endoscopy;;  balloon sweep, no stones  . REMOVAL OF STONES  05/27/2020   Procedure: REMOVAL OF STONES;  Surgeon: [name], MD;  [facility];  Service: Endoscopy;;  . REMOVAL OF STONES  06/26/2020   Procedure: REMOVAL OF STONES;  Surgeon: [name]., MD;  Location: [facility];  Service: Gastroenterology;;  . [name]  08/07/2019   Procedure: SPHINCTEROTOMY;  Surgeon: [name], MD;  [facility];  Service: Endoscopy;;  . STENT REMOVAL  11/07/2019   Procedure: STENT REMOVAL;  Surgeon: [name], MD;  [facility];  Service: Endoscopy;;  . STENT REMOVAL  05/13/2020   Procedure: STENT REMOVAL;  Surgeon: [name], MD;  [facility];  Service: Endoscopy;;  . STENT REMOVAL  05/27/2020   Procedure: STENT REMOVAL;  Surgeon: [name], MD;  [facility];  Service: Endoscopy;;  . STENT REMOVAL  06/26/2020   Procedure: STENT REMOVAL;  Surgeon: [name]., MD;  [facility];  Service: Gastroenterology;;     Social History:   reports that she has never smoked. She has never used smokeless tobacco. She reports that she does not drink alcohol and does not use drugs.   Family History:  Her family history includes Depression in her mother; Diabetes in her mother; Heart failure in her mother. There is no history of Colon cancer, Stomach cancer, Esophageal cancer, or Pancreatic cancer.   Allergies Allergies  Allergen Reactions  . [place] [Pentazocine]     Turned blue around lips and face, rash all over body     Home  Medications   Prior to Admission medications   Medication Sig Start Date End Date Taking? Authorizing Provider  acetaminophen (TYLENOL) 500 MG tablet Take 1,000 mg by mouth every 6 (six) hours as needed for moderate pain.   Yes [provider]  brexpiprazole (REXULTI) 2 MG TABS tablet Take 2 mg by mouth at bedtime.   Yes [provider]  Dulaglutide 4.5 MG/0.5ML SOPN Inject 4.5 mg into the skin every Monday. 12/20/18  Yes [provider]  Empagliflozin-metFORMIN HCl ER (SYNJARDY XR) 25-1000 MG TB24 Take 1 tablet by mouth daily before breakfast.  11/15/18  Yes [provider]  ezetimibe (ZETIA) 10 MG tablet Take 10 mg by mouth daily. 04/25/20  Yes [provider]  Insulin Glargine (BASAGLAR KWIKPEN) 100 UNIT/ML Inject 30 Units into the skin at bedtime. 03/20/20  Yes [provider]  LINZESS 290 MCG CAPS capsule Take 290 mcg by mouth every morning. 08/16/19  Yes [provider]  meloxicam (MOBIC) 15 MG tablet Take 15 mg by mouth daily.   Yes [provider]  metFORMIN (GLUCOPHAGE) 1000 MG tablet Take 1,000 mg by mouth at bedtime. 02/27/20  Yes [provider]  Nutritional Supplements (NUTRITIONAL SHAKE HIGH PROTEIN PO) Take 237 mLs by mouth 2 (two) times daily.   Yes [provider]  ondansetron (ZOFRAN-ODT) 4 MG disintegrating tablet Take 4 mg by mouth every 8 (eight) hours as needed for nausea or vomiting.   Yes [provider]  prazosin (MINIPRESS) 2 MG capsule Take 4 mg by mouth daily.   Yes [provider]  pregabalin (LYRICA) 100 MG capsule TAKE 1 CAPSULE BY MOUTH THREE TIMES A DAY Patient taking differently: Take 100 mg by mouth 3 (three) times daily. 02/12/20  Yes [name], NP  QUEtiapine (SEROQUEL) 400 MG tablet Take 800 mg by mouth at bedtime.   Yes [provider]  simvastatin (ZOCOR) 10 MG tablet Take 10 mg by mouth daily. 12/13/17  Yes [provider]  topiramate (TOPAMAX) 200  MG tablet Take 200 mg by mouth 2 (two) times daily. 04/12/20  Yes [provider]  vortioxetine HBr ([name]) 20 MG TABS tablet Take 20 mg by mouth at bedtime.   Yes [provider]  ziprasidone ([name]) 20 MG capsule Take 20 mg by mouth at bedtime.   Yes [provider]  amoxicillin-clavulanate (AUGMENTIN) 875-125 MG tablet Take 1 tablet by mouth 2 (two) times daily. Patient taking differently: Take 1 tablet by mouth 2 (two) times daily. Start date : 06/13/20 06/13/20   Prudencio Pair T, MD  lisinopril (ZESTRIL) 20 MG tablet TAKE 1 TABLET BY MOUTH EVERY DAY Patient not taking: No sig reported 02/08/20   [name], MD  pantoprazole (PROTONIX) 40 MG tablet Take 1 tablet (40 mg total) by mouth daily. Patient not taking: No sig reported 06/26/20 06/26/21  Mansouraty, Telford Nab., MD     Critical care time:    Performed by: Johnsie Cancel  Total critical care time: 40 minutes  Critical care time was exclusive of separately billable procedures and treating other patients.  Critical care was necessary to treat or prevent imminent or life-threatening deterioration.  Critical care was time spent personally by me on the following activities: development of treatment plan with patient and/or surrogate as well as nursing, discussions with consultants, evaluation of patient's response to treatment, examination of patient, obtaining history from patient or surrogate, ordering and performing treatments and interventions, ordering and review of laboratory studies,  ordering and review of radiographic studies, pulse oximetry and re-evaluation of patient's condition.  Johnsie Cancel, NP-C [facility] Personal contact information can be found on Amion  If no response please page: Adult pulmonary and critical care medicine pager on Amion unitl 7pm After 7pm please call [phone] 07/04/2020, 1:32 PM

## 2020-07-04 NOTE — Progress Notes (Addendum)
Progress Note  Chief Complaint:   Pancreatitis       ASSESSMENT / PLAN:    # 55 yo female with post ERCP pancreatitis. She has a history of NASH cirrhosis, DM, HTN, GERD, IBS, cholecystitis s/p cholecystectomy March 1962 which was complicated by post-op bile leak. Had had numerous ERCP with stent exchanges for persistent bile leak. Last week she had four plastic biliary stents placed in [place]. Patient returned to hospital with abdominal pain, nausea/ vomiting and lpost ERCP pancreatitis. CT scan suggested constipation, good biliary stent placement. Biliary drainage has been decreasing but recovery has been slow with inability to eat secondary to nausea / vomiting / abdominal pain  Nasoenteric tube placed a couple of days ago and tube feeds started.  --Drain output only 15 ml all day yesterday.  --Surgery to ask IR about exchanging pigtail drain for straight drain to start slowly backing drain out.  --Patient is EXTREMELY sleepy. Will not answer my questions. Respirations are okay. Hospitalist saw her, holding Dilaudid and putting on continuous pulse oximetry. If mental status doesn't quickly improve could try treating for hepatic encephalopathy. Hard to elicit asterixis given her inability to cooperate.   Addendum: Asked nursing to get updated vital. Patient hypotensive, tachycardic with 02 sat in 60's. Ordered oxygen, put tube feeds on hold. Called Hospitalist. Rapid response came. Patient to get narcan.     # NASH cirrhosis.       SUBJECTIVE:   She is EXTREMELY sleepy. Got only a small dose of Dilaudid around 4 am and again ~ 830 am but no oxycodone since yesterday.   Scheduled inpatient medications:  . brexpiprazole  2 mg Oral QHS  . enoxaparin (LOVENOX) injection  40 mg Subcutaneous Daily  . feeding supplement (PROSource TF)  45 mL Per Tube TID  . free water  100 mL Per Tube Q4H  . insulin aspart  0-9 Units Subcutaneous Q4H  . insulin glargine  5 Units Subcutaneous QHS  .  ondansetron (ZOFRAN) IV  4 mg Intravenous Q6H  . pantoprazole  40 mg Oral QHS  . pregabalin  100 mg Oral TID  . QUEtiapine  800 mg Oral QHS  . senna  1 tablet Oral BID  . topiramate  200 mg Oral BID  . vortioxetine HBr  20 mg Oral QHS  . ziprasidone  20 mg Oral QHS   Continuous inpatient infusions:  . sodium chloride 100 mL/hr at 07/04/20 0820  . feeding supplement (OSMOLITE 1.5 CAL) 1,000 mL (07/03/20 1250)   PRN inpatient medications: acetaminophen **OR** acetaminophen, bisacodyl, HYDROmorphone (DILAUDID) injection, oxyCODONE  Vital signs in last 24 hours: Temp:  [98 F (36.7 C)-98.4 F (36.9 C)] 98.2 F (36.8 C) (02/10 0424) Pulse Rate:  [82-109] 109 (02/10 0424) Resp:  [16] 16 (02/10 0424) BP: (97-131)/(64-76) 131/76 (02/10 0424) SpO2:  [73 %-98 %] 73 % (02/10 0424) Last BM Date: 06/30/20  Intake/Output Summary (Last 24 hours) at 07/04/2020 0858 Last data filed at 07/03/2020 1800 Gross per 24 hour  Intake 3270.81 ml  Output 3315 ml  Net -44.19 ml    Physical Exam:  . General: lethargic, obese female . Heart:  Regular rate and rhythm. No lower extremity edema . Pulmonary: Normal respiratory effort . Abdomen: Soft, nondistended, nontender. Normal bowel sounds.  . Neurologic: Alert and oriented . Psych: Pleasant. Cooperative.   Filed Weights   06/27/20 1552  Weight: 72.6 kg    Lab Results: Recent Labs    07/02/20 0446 07/03/20  0501  WBC 4.5 4.7  HGB 9.8* 9.9*  HCT 33.9* 34.2*  PLT 133* 146*   BMET Recent Labs    07/02/20 0446 07/03/20 0501 07/04/20 0508  NA 140 143 141  K 3.8 4.0 3.7  CL 109 112* 112*  CO2 21* 25 19*  GLUCOSE 113* 126* 194*  BUN 9 9 12   CREATININE 0.62 0.58 0.58  CALCIUM 8.8* 8.8* 8.6*   LFT Recent Labs    07/04/20 0508  PROT 6.8  ALBUMIN 3.6  AST 61*  ALT 38  ALKPHOS 128*  BILITOT 0.4      LOS: 6 days   [name] ,NP 07/04/2020, 8:58  AM    ________________________________________________________________________  Vel[facility] Heckler GI MD note:  I personally examined the patient, reviewed the data and agree with the assessment and plan described above. I am not certain what led to her clinical deterioration. Very possible aspiration pneumonia with resultant sepsis. She is sleepy but arousable and intermittently very conversant in the ICU, still with significant hypotension and is being started on vasopressor now.  She had a fever to 102 a couple hours ago.  Will follow along, currently no plan for repeat procedures. Still plan to have IR exchange her drain for one that can be slowly pulled back.  Data from drain output seems lost in Epic now.  I spoke with RN and they will be restarting the drain volumes in flowsheets.  Owens Loffler, MD [facility] Pager [phone]

## 2020-07-04 NOTE — Progress Notes (Signed)
Met with patient and her husband. She had a rough morning. Currently denies abdominal pain, but is still a bit somnolent. Abdomen is soft, tender RUQ, drain output light/clear with some mucus- no bile tinge currently. Output has been low the last few days.  I've discussed her case over the last few weeks with Dr. Fuller Plan and with several of my partners including the hepatobiliary specialists- I agree with the plans documented by Saverio Danker and Dr. Dema Severin.  Hopefully with multiple stents and slowly backing out the drain this will heal.

## 2020-07-04 NOTE — Progress Notes (Signed)
Subjective: Patient grunting in pain this morning.  RN reports patient's husband brought her a subway sub last night and she ate the whole thing despite complaining of chronic nausea.  Has Cortrak in place today with TFs running at 45cc/hr.    ROS: See above, otherwise other systems negative  Objective: Vital signs in last 24 hours: Temp:  [98 F (36.7 C)-98.4 F (36.9 C)] 98.2 F (36.8 C) (02/10 0424) Pulse Rate:  [82-109] 109 (02/10 0424) Resp:  [16] 16 (02/10 0424) BP: (97-131)/(64-76) 131/76 (02/10 0424) SpO2:  [73 %-98 %] 73 % (02/10 0424) Last BM Date: 06/30/20  Intake/Output from previous day: 02/09 0701 - 02/10 0700 In: 3270.8 [P.O.:120; I.V.:2366.6; NG/GT:784.2] Out: 3315 [Urine:3300; Drains:15] Intake/Output this shift: No intake/output data recorded.  PE: Abd: soft, mild tenderness in epigastrium, drain in RUQ with minimal output, only 15cc documented yesterday  Lab Results:  Recent Labs    07/02/20 0446 07/03/20 0501  WBC 4.5 4.7  HGB 9.8* 9.9*  HCT 33.9* 34.2*  PLT 133* 146*   BMET Recent Labs    07/03/20 0501 07/04/20 0508  NA 143 141  K 4.0 3.7  CL 112* 112*  CO2 25 19*  GLUCOSE 126* 194*  BUN 9 12  CREATININE 0.58 0.58  CALCIUM 8.8* 8.6*   PT/INR No results for input(s): LABPROT, INR in the last 72 hours. CMP     Component Value Date/Time   NA 141 07/04/2020 0508   NA 137 12/14/2012 1255   K 3.7 07/04/2020 0508   K 4.0 12/14/2012 1255   CL 112 (H) 07/04/2020 0508   CL 104 12/14/2012 1255   CO2 19 (L) 07/04/2020 0508   CO2 27 12/14/2012 1255   GLUCOSE 194 (H) 07/04/2020 0508   GLUCOSE 233 (H) 12/14/2012 1255   BUN 12 07/04/2020 0508   BUN 17 12/14/2012 1255   CREATININE 0.58 07/04/2020 0508   CREATININE 0.46 (L) 12/14/2012 1255   CALCIUM 8.6 (L) 07/04/2020 0508   CALCIUM 9.3 12/14/2012 1255   PROT 6.8 07/04/2020 0508   PROT 7.0 05/16/2019 1154   ALBUMIN 3.6 07/04/2020 0508   AST 61 (H) 07/04/2020 0508   ALT 38  07/04/2020 0508   ALKPHOS 128 (H) 07/04/2020 0508   BILITOT 0.4 07/04/2020 0508   GFRNONAA >60 07/04/2020 0508   GFRNONAA >60 12/14/2012 1255   GFRAA >60 02/16/2020 0520   GFRAA >60 12/14/2012 1255   Lipase     Component Value Date/Time   LIPASE 34 07/03/2020 0507       Studies/Results: DG Abd 1 View  Result Date: 07/02/2020 CLINICAL DATA:  Enteric tube placement EXAM: ABDOMEN - 1 VIEW COMPARISON:  April 11, 2019 FINDINGS: Enteric tube tip is at the level of the first portion of the duodenum. There is moderate stool throughout the colon. No bowel dilatation or air-fluid level to suggest bowel obstruction. No free air. There are apparent biliary stents in the right abdomen. There is an apparent drain in the lateral upper right abdomen. There are safety pins which presumably overlies the pelvis. There are tubal ligation clips bilaterally. IMPRESSION: Enteric tube tip at level of first portion of duodenum. No bowel obstruction or free air. Moderate stool in colon. Apparent biliary stents. Drain in right upper abdomen laterally noted. Electronically Signed   By: Lowella Grip III M.D.   On: 07/02/2020 15:24    Anti-infectives: Anti-infectives (From admission, onward)   Start     Dose/Rate Route  Frequency Ordered Stop   06/27/20 2100  ciprofloxacin (CIPRO) IVPB 400 mg        400 mg 200 mL/hr over 60 Minutes Intravenous Every 12 hours 06/27/20 2057 07/02/20 0944       Assessment/Plan HTN HLD Diabetes Cirrhosis   Persistent bile leak s/p fenestrated lap chole March 2021 by Dr. Kae Heller and now with post ERCP pancreatitis -discussed with multiple surgeons.  Will plan to see if IR can pull drain back to start edging it out of the fossa to help close down the fistulous connection. -agree with post pyloric TFs given apparent nausea and difficult tolerating oral diet from her pancreatitis.  Unclear why she ate a subway sandwich last night. -will have her follow up with Dr. Kae Heller  upon discharge as well.  FEN - FLD/TFs VTE - Lovenox ID - none currently   LOS: 6 days    Kim Hicks , [facility] 07/04/2020, 8:59 AM Please see Amion for pager number during day hours 7:00am-4:30pm or 7:00am -11:30am on weekends

## 2020-07-04 NOTE — Progress Notes (Addendum)
PROGRESS NOTE   Kim Hicks  YCX:[id number] DOB: 07-03-1965 DOA: 06/27/2020  PCP: [name], FNP    Brief Narrative: 55 year old female with history of liver cirrhosis from NASH, type 2 diabetes, type 2 diabetes mellitus, migraine, hyperlipidemia, hypertension, neuropathy, GERD, IBS, cholecystitis complicated by persistent bile leak who had ERCP for infected biloma, had stent placed x4 on 06/26/20.  She presented with complains of abdominal pain and chills.  Was found to have acute pancreatitis.  Was hospitalized for further management.  Gastroenterology was consulted.   Subjective: Patient noted to be quite somnolent this morning.  She was given a dose of Dilaudid a few minutes prior to my initial assessment.  Subsequently called by nursing staff that the patient was more obtunded and hypoxic and tachycardic with low blood pressures.  Seen immediately at bedside.  The dose of Narcan was given with improvement in mentation.  Fluid bolus ordered.  We will also order x-Rodgers Likes's of chest and abdomen.    Assessment & Plan:  Altered mental status with hypotension tachycardia and hypoxia Likely due to opioid overdose.  CBG was 316.  Patient on hydromorphone and oral narcotics as well.  She was given Narcan with improvement in mentation.  Blood pressure remains low.  She will be given fluid bolus.  She will be transferred to stepdown unit.  She is also getting tube feedings through her nasogastric feeding tube.  Appears to have been pulled out some.  She may have aspirated as well.  Will get chest x-Devonne Kitchen and abdominal films.  Patient persistently hypotensive.  Mentation has not improved much.  Noted to be febrile now.  Chest x-Roshawnda Pecora raise concern for pneumonia right greater than left.  Blood cultures ordered.  Vancomycin and Zosyn ordered.  Remains hypoxic requiring oxygen.  Additional fluid bolus ordered.  Additional dose of Narcan to be given.  Discussed with critical care medicine who will evaluate.   Feeding tube now in good position after it was replaced.  Procalcitonin noted to be 20.4. Hgb has dropped to 6.7. No overt bleeding noted. This was checked after almost 3 ltrs of fluid bolus. Could be dilutional drop. But cannot r/o occult bleeding. Will repeat CT abdomen and pelvis.  Post ERCP pancreatitis/history of persistent bile leak/ s/p biliary stent placement Patient had significantly elevated lipase level of 1800.  Gastroenterology was consulted and they are following.  Tube feedings were initiated. Patient with a recent biliary stent placement. Was on ciprofloxacin. She has completed course of same. General surgery was consulted to assist with management of bile leak.  Diabetes mellitus type 2, insulin-dependent At home she is on Basaglar 30 units nightly, Trulicity 4 mg every week and Synjardy, metformin.   CBGs were noted to be low due to her poor oral intake and so dose of Lantus was decreased.  Glucose levels have increased now that she is on tube feedings.  We will increase the dose of her Lantus.  Normal anion gap metabolic acidosis Stable  Anemia of chronic disease Hemoglobin low but stable.  No evidence of overt blood loss.   Essential hypertension/hyperlipidemia Antihypertensives were placed on hold at the time of admission.  Low blood pressure this morning likely due to opioid overdose.    GERD  On PPI.    Chronic constipation Laxatives  Liver cirrhosis due to NASH Outpatient monitoring per GI.   DVT prophylaxis: Lovenox  Code Status: Full code Family Communication: No family at bedside Disposition: Hopefully return home when improved  Status is:  Inpatient  Remains inpatient appropriate because:Ongoing active pain requiring inpatient pain management, IV treatments appropriate due to intensity of illness or inability to take PO and Inpatient level of care appropriate due to severity of illness   Dispo:  Patient From: Home  Planned Disposition: To be  determined  Expected discharge date: 07/05/2020  Medically stable for discharge: No        Consultants: [facility] gastroenterology Procedures: None yet  Culture/Microbiology    Component Value Date/Time   SDES  05/23/2020 1557    ABSCESS GALLBLADDER FOSSA FLUID Performed at [facility], [place] [address], [place], [place] [address]    [place]  05/23/2020 1557    Normal Performed at [facility], [place] [address], [place], [place] [address]    CULT  05/23/2020 1557    RARE ENTEROCOCCUS FAECALIS NO ANAEROBES ISOLATED Performed at [facility], [place] [address], [place], [place] [address]    REPTSTATUS 05/28/2020 FINAL 05/23/2020 1557      Medications: Scheduled Meds: . brexpiprazole  2 mg Oral QHS  . enoxaparin (LOVENOX) injection  40 mg Subcutaneous Daily  . feeding supplement (PROSource TF)  45 mL Per Tube TID  . free water  100 mL Per Tube Q4H  . insulin aspart  0-9 Units Subcutaneous Q4H  . insulin glargine  5 Units Subcutaneous QHS  . naloxone      . ondansetron (ZOFRAN) IV  4 mg Intravenous Q6H  . pantoprazole  40 mg Oral QHS  . [START ON 07/05/2020] pregabalin  100 mg Oral TID  . [START ON 07/05/2020] QUEtiapine  800 mg Oral QHS  . senna  1 tablet Oral BID  . [START ON 07/05/2020] topiramate  200 mg Oral BID  . [START ON 07/05/2020] vortioxetine HBr  20 mg Oral QHS  . [START ON 07/05/2020] ziprasidone  20 mg Oral QHS   Continuous Infusions: . sodium chloride 100 mL/hr at 07/04/20 0820  . feeding supplement (OSMOLITE 1.5 CAL) 1,000 mL (07/03/20 1250)  . sodium chloride      Antimicrobials: Anti-infectives (From admission, onward)   Start     Dose/Rate Route Frequency Ordered Stop   06/27/20 2100  ciprofloxacin (CIPRO) IVPB 400 mg        400 mg 200 mL/hr over 60 Minutes Intravenous Every 12 hours 06/27/20 2057 07/02/20 0944     Objective: Vitals: Today's Vitals   07/04/20 0358 07/04/20 0424 07/04/20 0427 07/04/20 0828   BP:  131/76    Pulse:  (!) 109    Resp:  16    Temp:  98.2 F (36.8 C)    TempSrc:  Oral    SpO2:  (!) 73%    Weight:      Height:      PainSc: 10-Worst pain ever  8  10-Worst pain ever    Intake/Output Summary (Last 24 hours) at 07/04/2020 1002 Last data filed at 07/03/2020 1800 Gross per 24 hour  Intake 1399.61 ml  Output 2415 ml  Net -1015.39 ml   Filed Weights   06/27/20 1552  Weight: 72.6 kg   Weight change:   Intake/Output from previous day: 02/09 0701 - 02/10 0700 In: 3270.8 [P.O.:120; I.V.:2366.6; NG/GT:784.2] Out: 3315 [Urine:3300; Drains:15] Intake/Output this shift: No intake/output data recorded. Filed Weights   06/27/20 1552  Weight: 72.6 kg    Examination:  General appearance: Obtunded this morning Resp: Poor effort noted.  Diminished air entry at the bases.  No definite crackles or wheezing  appreciated. Cardio: S1-S2 is tachycardic regular.  No S3-S4.  No rubs murmurs or bruit GI: Abdomen is soft.  Tender in the epigastric area without any rebound rigidity or guarding.  No masses organomegaly.  Sluggish bowel sounds. Extremities: No edema.   Neurologic: Noted to be encephalopathic.  Mentation improved after Narcan.  No focal deficits.      Data Reviewed: I have personally reviewed following labs and imaging studies CBC: Recent Labs  Lab 06/27/20 1658 06/28/20 0446 06/29/20 0847 06/30/20 0550 07/01/20 0427 07/02/20 0446 07/03/20 0501  WBC 6.2 6.8 5.2 5.1 4.4 4.5 4.7  NEUTROABS 3.7 4.2  --   --   --   --   --   HGB 9.8* 9.3* 9.8* 9.4* 10.3* 9.8* 9.9*  HCT 33.5* 32.5* 34.1* 31.8* 35.8* 33.9* 34.2*  MCV 83.3 84.2 84.8 83.5 84.4 84.1 84.7  PLT 161 144* 132* 142* 149* 133* 016*   Basic Metabolic Panel: Recent Labs  Lab 06/28/20 0446 06/29/20 0847 06/30/20 0550 07/01/20 0427 07/02/20 0446 07/02/20 1401 07/02/20 1630 07/03/20 0501 07/04/20 0508  NA 139   < > 142 140 140  --   --  143 141  K 4.0   < > 4.0 3.5 3.8  --   --  4.0 3.7   CL 112*   < > 111 109 109  --   --  112* 112*  CO2 21*   < > 24 22 21*  --   --  25 19*  GLUCOSE 186*   < > 128* 116* 113*  --   --  126* 194*  BUN 16   < > 11 9 9   --   --  9 12  CREATININE 0.43*   < > 0.52 0.43* 0.62  --   --  0.58 0.58  CALCIUM 8.6*   < > 8.7* 8.9 8.8*  --   --  8.8* 8.6*  MG 1.8  --   --  1.7  --  1.7 1.7 1.8  --   PHOS 2.7  --   --   --   --  3.0 2.7 2.8  --    < > = values in this interval not displayed.   GFR: Estimated Creatinine Clearance: 79.3 mL/min (by C-G formula based on SCr of 0.58 mg/dL).  Liver Function Tests: Recent Labs  Lab 06/30/20 0550 07/01/20 0427 07/02/20 0446 07/03/20 0501 07/04/20 0508  AST 39 49* 63* 67* 61*  ALT 24 31 38 40 38  ALKPHOS 118 128* 133* 129* 128*  BILITOT 0.6 0.5 0.6 0.4 0.4  PROT 6.4* 7.2 6.8 6.5 6.8  ALBUMIN 3.4* 3.7 3.6 3.5 3.6   Recent Labs  Lab 06/28/20 0446 06/29/20 0847 06/30/20 0550 07/01/20 0427 07/03/20 0507  LIPASE 1,838* 1,052* 294* 59* 34   Coagulation Profile: Recent Labs  Lab 06/27/20 1700  INR 1.0   Cardiac Enzymes: Recent Labs  Lab 06/28/20 0446  CKTOTAL 22*   CBG: Recent Labs  Lab 07/03/20 1615 07/03/20 2021 07/04/20 0003 07/04/20 0422 07/04/20 0807  GLUCAP 147* 260* 143* 191* 316*   Sepsis Labs: Recent Labs  Lab 06/28/20 0446  LATICACIDVEN 0.7    Recent Results (from the past 240 hour(s))  SARS CORONAVIRUS 2 (TAT 6-24 HRS) Nasopharyngeal Nasopharyngeal Swab     Status: None   Collection Time: 06/27/20  8:37 PM   Specimen: Nasopharyngeal Swab  Result Value Ref Range Status   SARS Coronavirus 2 NEGATIVE NEGATIVE Final    Comment: (  NOTE) SARS-CoV-2 target nucleic acids are NOT DETECTED.  The SARS-CoV-2 RNA is generally detectable in upper and lower respiratory specimens during the acute phase of infection. Negative results do not preclude SARS-CoV-2 infection, do not rule out co-infections with other pathogens, and should not be used as the sole basis for  treatment or other patient management decisions. Negative results must be combined with clinical observations, patient history, and epidemiological information. The expected result is Negative.  Fact Sheet for Patients: SugarRoll.be  Fact Sheet for Healthcare Providers: https://www.woods-mathews.com/  This test is not yet approved or cleared by the [place] FDA and  has been authorized for detection and/or diagnosis of SARS-CoV-2 by FDA under an Emergency Use Authorization (EUA). This EUA will remain  in effect (meaning this test can be used) for the duration of the COVID-19 declaration under Se ction 564(b)(1) of the Act, 21 U.S.C. section 360bbb-3(b)(1), unless the authorization is terminated or revoked sooner.  Performed at [facility], [place] [address], [place], [place] [address]      Radiology Studies: DG Abd 1 View  Result Date: 07/02/2020 CLINICAL DATA:  Enteric tube placement EXAM: ABDOMEN - 1 VIEW COMPARISON:  April 11, 2019 FINDINGS: Enteric tube tip is at the level of the first portion of the duodenum. There is moderate stool throughout the colon. No bowel dilatation or air-fluid level to suggest bowel obstruction. No free air. There are apparent biliary stents in the right abdomen. There is an apparent drain in the lateral upper right abdomen. There are safety pins which presumably overlies the pelvis. There are tubal ligation clips bilaterally. IMPRESSION: Enteric tube tip at level of first portion of duodenum. No bowel obstruction or free air. Moderate stool in colon. Apparent biliary stents. Drain in right upper abdomen laterally noted. Electronically Signed   By: Lowella Grip III M.D.   On: 07/02/2020 15:24     LOS: 6 days   Bonnielee Haff, MD [facility]  07/04/2020, 10:02 AM

## 2020-07-05 ENCOUNTER — Inpatient Hospital Stay (HOSPITAL_COMMUNITY): Payer: Medicare Other

## 2020-07-05 DIAGNOSIS — N179 Acute kidney failure, unspecified: Secondary | ICD-10-CM

## 2020-07-05 DIAGNOSIS — R6521 Severe sepsis with septic shock: Secondary | ICD-10-CM | POA: Diagnosis not present

## 2020-07-05 DIAGNOSIS — A419 Sepsis, unspecified organism: Secondary | ICD-10-CM

## 2020-07-05 DIAGNOSIS — J189 Pneumonia, unspecified organism: Secondary | ICD-10-CM

## 2020-07-05 DIAGNOSIS — R1011 Right upper quadrant pain: Secondary | ICD-10-CM | POA: Diagnosis not present

## 2020-07-05 DIAGNOSIS — Y95 Nosocomial condition: Secondary | ICD-10-CM

## 2020-07-05 LAB — GLUCOSE, CAPILLARY
Glucose-Capillary: 163 mg/dL — ABNORMAL HIGH (ref 70–99)
Glucose-Capillary: 183 mg/dL — ABNORMAL HIGH (ref 70–99)
Glucose-Capillary: 245 mg/dL — ABNORMAL HIGH (ref 70–99)
Glucose-Capillary: 246 mg/dL — ABNORMAL HIGH (ref 70–99)
Glucose-Capillary: 278 mg/dL — ABNORMAL HIGH (ref 70–99)
Glucose-Capillary: 324 mg/dL — ABNORMAL HIGH (ref 70–99)

## 2020-07-05 LAB — COOXEMETRY PANEL
Carboxyhemoglobin: 0.9 % (ref 0.5–1.5)
Methemoglobin: 1.1 % (ref 0.0–1.5)
O2 Saturation: 86.1 %
Total hemoglobin: 11 g/dL — ABNORMAL LOW (ref 12.0–16.0)

## 2020-07-05 LAB — CBC
HCT: 35.8 % — ABNORMAL LOW (ref 36.0–46.0)
Hemoglobin: 10.5 g/dL — ABNORMAL LOW (ref 12.0–15.0)
MCH: 24.9 pg — ABNORMAL LOW (ref 26.0–34.0)
MCHC: 29.3 g/dL — ABNORMAL LOW (ref 30.0–36.0)
MCV: 84.8 fL (ref 80.0–100.0)
Platelets: 182 10*3/uL (ref 150–400)
RBC: 4.22 MIL/uL (ref 3.87–5.11)
RDW: 17.2 % — ABNORMAL HIGH (ref 11.5–15.5)
WBC: 12.5 10*3/uL — ABNORMAL HIGH (ref 4.0–10.5)
nRBC: 0 % (ref 0.0–0.2)

## 2020-07-05 LAB — COMPREHENSIVE METABOLIC PANEL
ALT: 47 U/L — ABNORMAL HIGH (ref 0–44)
AST: 75 U/L — ABNORMAL HIGH (ref 15–41)
Albumin: 2.8 g/dL — ABNORMAL LOW (ref 3.5–5.0)
Alkaline Phosphatase: 95 U/L (ref 38–126)
Anion gap: 9 (ref 5–15)
BUN: 40 mg/dL — ABNORMAL HIGH (ref 6–20)
CO2: 14 mmol/L — ABNORMAL LOW (ref 22–32)
Calcium: 7.7 mg/dL — ABNORMAL LOW (ref 8.9–10.3)
Chloride: 117 mmol/L — ABNORMAL HIGH (ref 98–111)
Creatinine, Ser: 1.3 mg/dL — ABNORMAL HIGH (ref 0.44–1.00)
GFR, Estimated: 49 mL/min — ABNORMAL LOW (ref >60)
Glucose, Bld: 306 mg/dL — ABNORMAL HIGH (ref 70–99)
Potassium: 3.1 mmol/L — ABNORMAL LOW (ref 3.5–5.1)
Sodium: 140 mmol/L (ref 135–145)
Total Bilirubin: 0.7 mg/dL (ref 0.3–1.2)
Total Protein: 5.7 g/dL — ABNORMAL LOW (ref 6.5–8.1)

## 2020-07-05 LAB — PROCALCITONIN: Procalcitonin: 45.61 ng/mL

## 2020-07-05 LAB — HEMOGLOBIN AND HEMATOCRIT, BLOOD
HCT: 30.5 % — ABNORMAL LOW (ref 36.0–46.0)
Hemoglobin: 8.9 g/dL — ABNORMAL LOW (ref 12.0–15.0)

## 2020-07-05 LAB — CREATININE, URINE, RANDOM: Creatinine, Urine: <10 mg/dL

## 2020-07-05 LAB — SODIUM, URINE, RANDOM: Sodium, Ur: 116 mmol/L

## 2020-07-05 MED ORDER — VASOPRESSIN 20 UNITS/100 ML INFUSION FOR SHOCK
0.0000 [IU]/min | INTRAVENOUS | Status: DC
  Administered 2020-07-05: 0.03 [IU]/min via INTRAVENOUS
  Administered 2020-07-06: 0.02 [IU]/min via INTRAVENOUS
  Administered 2020-07-06: 0.03 [IU]/min via INTRAVENOUS
  Filled 2020-07-05 (×5): qty 100

## 2020-07-05 MED ORDER — NOREPINEPHRINE 4 MG/250ML-% IV SOLN
0.0000 ug/min | INTRAVENOUS | Status: DC
  Administered 2020-07-05: 30 ug/min via INTRAVENOUS
  Administered 2020-07-05: 18 ug/min via INTRAVENOUS
  Administered 2020-07-05: 12 ug/min via INTRAVENOUS
  Administered 2020-07-05: 25 ug/min via INTRAVENOUS
  Administered 2020-07-05: 29 ug/min via INTRAVENOUS
  Administered 2020-07-05: 22 ug/min via INTRAVENOUS
  Filled 2020-07-05 (×5): qty 250

## 2020-07-05 MED ORDER — LACTULOSE 10 GM/15ML PO SOLN
20.0000 g | Freq: Two times a day (BID) | ORAL | Status: DC
  Administered 2020-07-05: 20 g
  Filled 2020-07-05: qty 30

## 2020-07-05 MED ORDER — INSULIN ASPART 100 UNIT/ML ~~LOC~~ SOLN
0.0000 [IU] | SUBCUTANEOUS | Status: DC
  Administered 2020-07-05: 7 [IU] via SUBCUTANEOUS
  Administered 2020-07-05 (×2): 4 [IU] via SUBCUTANEOUS
  Administered 2020-07-05: 15 [IU] via SUBCUTANEOUS
  Administered 2020-07-06: 4 [IU] via SUBCUTANEOUS
  Administered 2020-07-07 (×2): 3 [IU] via SUBCUTANEOUS
  Administered 2020-07-08: 4 [IU] via SUBCUTANEOUS
  Administered 2020-07-08 (×2): 3 [IU] via SUBCUTANEOUS
  Administered 2020-07-09 (×2): 7 [IU] via SUBCUTANEOUS
  Administered 2020-07-09 (×2): 4 [IU] via SUBCUTANEOUS
  Administered 2020-07-09: 7 [IU] via SUBCUTANEOUS
  Administered 2020-07-10: 4 [IU] via SUBCUTANEOUS
  Administered 2020-07-10: 7 [IU] via SUBCUTANEOUS

## 2020-07-05 MED ORDER — SORBITOL 70 % SOLN
960.0000 mL | TOPICAL_OIL | Freq: Once | ORAL | Status: AC
  Administered 2020-07-05: 960 mL via RECTAL
  Filled 2020-07-05: qty 473

## 2020-07-05 MED ORDER — INSULIN DETEMIR 100 UNIT/ML ~~LOC~~ SOLN
10.0000 [IU] | Freq: Two times a day (BID) | SUBCUTANEOUS | Status: DC
  Administered 2020-07-05 – 2020-07-11 (×13): 10 [IU] via SUBCUTANEOUS
  Filled 2020-07-05 (×14): qty 0.1

## 2020-07-05 MED ORDER — ALBUMIN HUMAN 5 % IV SOLN
12.5000 g | Freq: Once | INTRAVENOUS | Status: AC
  Administered 2020-07-05: 12.5 g via INTRAVENOUS
  Filled 2020-07-05: qty 250

## 2020-07-05 MED ORDER — FUROSEMIDE 10 MG/ML IJ SOLN
40.0000 mg | Freq: Once | INTRAMUSCULAR | Status: AC
  Administered 2020-07-05: 40 mg via INTRAVENOUS
  Filled 2020-07-05: qty 4

## 2020-07-05 MED ORDER — LACTULOSE 10 GM/15ML PO SOLN
20.0000 g | Freq: Three times a day (TID) | ORAL | Status: DC
  Administered 2020-07-05 – 2020-07-08 (×9): 20 g
  Filled 2020-07-05 (×9): qty 30

## 2020-07-05 MED ORDER — NOREPINEPHRINE 16 MG/250ML-% IV SOLN
0.0000 ug/min | INTRAVENOUS | Status: DC
  Administered 2020-07-05: 22 ug/min via INTRAVENOUS
  Administered 2020-07-06: 17 ug/min via INTRAVENOUS
  Filled 2020-07-05 (×2): qty 250

## 2020-07-05 MED ORDER — POTASSIUM CHLORIDE 10 MEQ/50ML IV SOLN
10.0000 meq | INTRAVENOUS | Status: AC
  Administered 2020-07-05 (×2): 10 meq via INTRAVENOUS
  Filled 2020-07-05 (×2): qty 50

## 2020-07-05 NOTE — Progress Notes (Addendum)
Progress Note  Chief Complaint:   pancreatitis    ASSESSMENT / PLAN:    #  Post -ERCP pancreatitis.  55 yo female with NASH cirrhosis, DM, HTN, GERD, IBS,  cholecystitis s/p cholecystectomy March 9242 which was complicated by post-op bile leak. Has perc drain. She is s/p numerous ERCPs with stent exchanges for persistent bile leak. Last week she had four plastic biliary stents placed in CBD. Now admitted with post-ERCP pancreatitis --Drain output day before yesterday was only 15 ml. Now drain output is not showing up in I+O. Will ask Nursing to document drain output in I+O. This am there is negilgible drain output. CT scan last night mentions downward migration of biliary stents, I will ask Dr. Ardis Hughs to review  --Surgery following. They are discussing case with IR. To replace pigtail with strain drain which can then slowly be pulled back.   # Sepsis, ? Aspiration PNA  vrs HCAP.  Somnolent on rounds yesterday. Found to have hypoxia / hypotension/ tachycardia.  Given Narcan with improvement in mental status but remained hypotensive . subsequently became febrile. Transferred to ICU. CXR showed bilateral airspace disease R> L. Non-contrast CT scan abd / pelvis showed same pulmonary findings, perc drain in gb fossa without recurrent or residual fluid collection, downstream migration of 4 biliary stents traversing the distal CBD, stents patent, increasing fecal retention, developing bowel wall thickening of distal transverse colon ( inflammatory, infectious or stercoral colitis). No evidence for acute hemorrhage to explain anemia  --On  Zosyn, vasopressors --PCCM following  --Her mental status is better than yesterday but still having difficulty staying awake. Possibly mild asterixis on exam. At risk for HE given illness, constipation, medications. Will start Lactulose BID through small bowel feeding tube.  --She vomited last night, has been on intermittent wall suction. CT scan shows significant  stool in colon. Once bowels moving after SMOG enema I think we can start trickle feeds.   # Constipation per CT scan.  --SMOG already ordered. Ordering lactulose which should also help.   # Acute on chronic anemia. Hgb was stable in upper 9 range, declined to 6.7 yesterday around 1 pm ( though I think that was after she was given IVF during rapid response) --Got 1 uPRBC this am though RN says it was only a partial unit due to IV issues? Hgb now 8.9. No overt GI bleeding at this point.      # [place] cirrhosis. No definite evidence for decompensation though possible mild asterixis on exam. Majority of encephalopathy likely from opioids and sepsis but HE could be contributing.  --Starting lactulose      SUBJECTIVE:   No complaints right now. Husband in room    OBJECTIVE:    Scheduled inpatient medications:  . bisacodyl  10 mg Rectal Daily  . Chlorhexidine Gluconate Cloth  6 each Topical Daily  . enoxaparin (LOVENOX) injection  40 mg Subcutaneous Daily  . feeding supplement (PROSource TF)  45 mL Per Tube TID  . free water  100 mL Per Tube Q4H  . insulin aspart  0-20 Units Subcutaneous Q4H  . insulin detemir  10 Units Subcutaneous BID  . magnesium hydroxide  30 mL Per Tube BID  . mouth rinse  15 mL Mouth Rinse BID  . pantoprazole sodium  40 mg Per Tube QHS  . pregabalin  100 mg Oral TID  . sennosides  5 mL Oral QHS  . sodium chloride flush  10-40 mL Intracatheter Q12H   Continuous  inpatient infusions:  . sodium chloride Stopped (07/05/20 0617)  . sodium chloride Stopped (07/05/20 0620)  . sodium chloride Stopped (07/05/20 0104)  . feeding supplement (OSMOLITE 1.5 CAL) Stopped (07/04/20 1100)  . lactated ringers    . norepinephrine (LEVOPHED) Adult infusion 29 mcg/min (07/05/20 0819)  . piperacillin-tazobactam (ZOSYN)  IV 12.5 mL/hr at 07/05/20 0710  . potassium chloride 10 mEq (07/05/20 0809)  . vasopressin 0.03 Units/min (07/05/20 0710)   PRN inpatient medications: sodium  chloride, acetaminophen **OR** acetaminophen, naLOXone (NARCAN)  injection, sodium chloride flush  Vital signs in last 24 hours: Temp:  [98.24 F (36.8 C)-103.6 F (39.8 C)] 98.24 F (36.8 C) (02/11 0700) Pulse Rate:  [96-125] 119 (02/11 0700) Resp:  [14-33] 14 (02/11 0700) BP: (53-170)/(20-131) 108/32 (02/11 0700) SpO2:  [85 %-100 %] 98 % (02/11 0700) Weight:  [79.7 kg-86.2 kg] 86.2 kg (02/11 0413) Last BM Date: 06/30/20  Intake/Output Summary (Last 24 hours) at 07/05/2020 0900 Last data filed at 07/05/2020 0710 Gross per 24 hour  Intake 7009.24 ml  Output 1300 ml  Net 5709.24 ml     Physical Exam:  . General: pleasant female in NAD . Heart:  Sinus tachycardia. No lower extremity edema . Pulmonary: congested cough. Normal respiratory effort . Abdomen: Soft, nondistended, diffuse tender. A few bomal bowel sounds. Minimal amount of light yellow perc drain output.  . Neurologic: easily arousable. Oriented to person, year and knows she is in hospital. Possible mild asterixis on exam. Still falls asleep easily  . Psych: Pleasant. Cooperative.   Filed Weights   06/27/20 1552 07/04/20 1045 07/05/20 0413  Weight: 72.6 kg 79.7 kg 86.2 kg      Lab Results: Recent Labs    07/03/20 0501 07/04/20 1258 07/05/20 0542 07/05/20 0636  WBC 4.7 10.9* 12.5*  --   HGB 9.9* 6.7* 10.5* 8.9*  HCT 34.2* 24.0* 35.8* 30.5*  PLT 146* 156 182  --    BMET Recent Labs    07/03/20 0501 07/04/20 0508 07/05/20 0542  NA 143 141 140  K 4.0 3.7 3.1*  CL 112* 112* 117*  CO2 25 19* 14*  GLUCOSE 126* 194* 306*  BUN 9 12 40*  CREATININE 0.58 0.58 1.30*  CALCIUM 8.8* 8.6* 7.7*   LFT Recent Labs    07/05/20 0542  PROT 5.7*  ALBUMIN 2.8*  AST 75*  ALT 47*  ALKPHOS 95  BILITOT 0.7   PT/INR No results for input(s): LABPROT, INR in the last 72 hours. Hepatitis Panel No results for input(s): HEPBSAG, HCVAB, HEPAIGM, HEPBIGM in the last 72 hours.  CT ABDOMEN PELVIS WO CONTRAST  Result  Date: 07/04/2020 CLINICAL DATA:  Fever, hypotensive, anemia, abdominal pain EXAM: CT ABDOMEN AND PELVIS WITHOUT CONTRAST TECHNIQUE: Multidetector CT imaging of the abdomen and pelvis was performed following the standard protocol without IV contrast. COMPARISON:  06/27/2020 FINDINGS: Lower chest: Dependent lower lobe consolidation favor airspace disease over atelectasis. No effusion or pneumothorax. Unenhanced CT was performed per clinician order. Lack of IV contrast limits sensitivity and specificity, especially for evaluation of abdominal/pelvic solid viscera. Hepatobiliary: Nodularity of the liver capsule compatible cirrhosis. No focal intrahepatic finding on this unenhanced exam. Stable percutaneous drainage catheter within the gallbladder fossa with no evidence of recurrence or residual fluid collection. Gallbladder is surgically absent. There are 4 stents within the downstream common bile duct, extending into the duodenal lumen. The stents have migrated distally since prior study, but are still patent with pneumobilia again identified. Pancreas: No gross abnormalities on  this unenhanced exam. Spleen: Normal in size without focal abnormality. Adrenals/Urinary Tract: Adrenal glands are unremarkable. Kidneys are normal, without renal calculi, focal lesion, or hydronephrosis. Bladder is unremarkable. Stomach/Bowel: Large amount of retained stool within the distal colon consistent with constipation. There is colonic wall thickening within the distal transverse colon through the splenic flexure, consistent with inflammatory or infectious colitis. No bowel obstruction or ileus. Enteric catheter extends through the gastric lumen, tip within the second portion duodenum. Vascular/Lymphatic: No significant vascular findings are present. No enlarged abdominal or pelvic lymph nodes. Reproductive: Status post hysterectomy. No adnexal masses. Other: No free fluid or free gas. No abdominal wall hernia. No evidence of  retroperitoneal hemorrhage. Musculoskeletal: There are no acute or destructive bony lesions. Reconstructed images demonstrate no additional findings. IMPRESSION: 1. Interval development of bibasilar airspace disease, which could reflect aspiration or infection. 2. Percutaneous drainage catheter within the gallbladder fossa, with no evidence of recurrence or residual fluid collection. 3. Downstream migration of the 4 biliary stents, traversing the distal common bile duct and ampulla. The stents remain patent, with mild pneumobilia again noted. 4. Increasing fecal retention consistent with constipation. Developing bowel wall thickening of the distal transverse colon could reflect inflammatory, infectious, or stercoral colitis. 5. Cirrhosis. 6. No evidence of acute hemorrhage to explain the patient's anemia. Electronically Signed   By: Randa Ngo M.D.   On: 07/04/2020 18:53   DG CHEST PORT 1 VIEW  Result Date: 07/05/2020 CLINICAL DATA:  Central line placement. EXAM: PORTABLE CHEST 1 VIEW COMPARISON:  07/04/2020 FINDINGS: 0548 hours. Low lung volumes. The cardio pericardial silhouette is enlarged. Patchy bilateral airspace opacity again noted, right greater than left. No substantial pleural effusion. A feeding tube passes into the stomach although the distal tip position is not included on the film. Right IJ central line tip overlies the mid to low right atrium. Right PICC line tip overlies the proximal SVC. Bones are diffusely demineralized. Telemetry leads overlie the chest. IMPRESSION: 1. Support apparatus as above. 2. Patchy bilateral airspace disease, right greater than left, similar to prior. Electronically Signed   By: Misty Stanley M.D.   On: 07/05/2020 06:16   DG CHEST PORT 1 VIEW  Result Date: 07/04/2020 CLINICAL DATA:  Hypoxia.  NG placement.  Rapid response. EXAM: PORTABLE CHEST 1 VIEW COMPARISON:  11/07/2019 FINDINGS: Feeding tube overlies the left main bronchus. This could be in the mid  esophagus or in the airway. Recommend repositioning. Patchy airspace disease bilaterally right greater than left. No significant effusion. Heart size enlarged. IMPRESSION: Feeding tube overlies the left main bronchus and could be in the mid esophagus or in the airway. Recommend repositioning Bilateral airspace disease right greater than left. Possible pneumonia or edema. Electronically Signed   By: Franchot Gallo M.D.   On: 07/04/2020 10:56   DG Abd Portable 1V  Result Date: 07/04/2020 CLINICAL DATA:  Feeding tube placement EXAM: PORTABLE ABDOMEN - 1 VIEW COMPARISON:  07/02/2020 FINDINGS: Feeding tube passes through the stomach with the tip in the right upper quadrant. This could be in the duodenum or in the antrum. Biliary drain on the right. Multiple common bile duct stents are present. Normal bowel gas pattern. IMPRESSION: Feeding tube passes through the stomach. Tip in the antrum or proximal duodenum. Electronically Signed   By: Franchot Gallo M.D.   On: 07/04/2020 10:57   [place] EKG SITE RITE  Result Date: 07/04/2020 If Site Rite image not attached, placement could not be confirmed due to current cardiac rhythm.  LOS: 7 days   [name] ,NP 07/05/2020, 9:00 AM    ________________________________________________________________________  Velora Heckler GI MD note:  I personally examined the patient, reviewed the data and agree with the assessment and plan described above. The 4 plastic biliay stents may have migrated a bit distally but are still in good position. JP drain output is really decreasing, none in about 24 hours now.  I think slow pull of the drain will be safe, via IR.  Continue nutritional support via feeding tube and will use that for lactulose as well.  I expect her bowels will start to move with SMOG enema and lactulose.   Will follow along.   Owens Loffler, MD [facility] Gastroenterology Pager [phone]

## 2020-07-05 NOTE — TOC Progression Note (Signed)
Transition of Care [facility]) - Progression Note    Patient Details  Name: Kim Hicks MRN: [id number] Date of Birth: [birth year]  Transition of [facility]) CM/SW Contact  Leeroy Cha, RN Phone Number: 07/05/2020, 8:22 AM  Clinical Narrative:    Temp 103.6, o2 at 4l/min via [place], iv levophed, iv zosyn, iv pressors. Plan: unable to determine at this pint goal is to return to home.        Expected Discharge Plan and Services                                                 Social Determinants of Health (SDOH) Interventions    Readmission Risk Interventions No flowsheet data found.

## 2020-07-05 NOTE — Progress Notes (Addendum)
Spoke with patient's spouse, Woodie Trusty, he is in agreeance with pt receiving blood products. Gave brief update about Kim reasoning and need for blood product. Mr. Harner very appreciative. He gives consent for his wife, Theodore Rahrig, to receive blood products.  Spoke to bedside RN and informed her of consent and informed eMD as well.   Margaret Pyle, RN

## 2020-07-05 NOTE — Progress Notes (Signed)
NAME:  Kim Hicks, MRN:  [id number], DOB:  September 13, 1965, LOS: 7 ADMISSION DATE:  06/27/2020, CONSULTATION DATE: 07/04/2020 REFERRING MD: Dr. Pricilla Handler, CHIEF COMPLAINT: Hypertension altered mental status  Brief History:  55 year old female with NASH & known persistent bile leak after undergoing laparoscopic cholecystectomy 08/04/2019   Admitted with post ERCP pancreatitis 2/2 she has been followed by infectious disease and GI.  PCCM consult 2/10 due to hypotension and somnolence >> some improvement with Narcan  History of Present Illness:  Kim Hicks is a 55 year old female with an extensive past medical history significant for but not limited to cirrhosis from NASH, cholecystitis complicated by persistent bile leak and infected biloma, type 2 diabetes hyperlipidemia, hypertension, migraines, neuropathy, GERD, and IBS who presented for acute complaints of abdominal and low back pain that began after outpatient ERCP performed 2/2.  Outpatient GI recommended patient presented to the ED for further evaluation.  Patient has known persistent bile leak after undergoing laparoscopic cholecystectomy 08/04/2019 for chronic cholecystitis.  She has underwent multiple ERCPs and stent placement/stent exchanges.  Most recent stent exchange was 05/27/2020 by ERCP.    Morning of 2/10 patient was seen somnolent with associated hypotension and hypoxia.  This prompted rapid response called and administration of Narcan given recent dose of Dilaudid.  Mentation improved with Narcan dosing.  Blood pressure slightly improved with fluid bolus.  PCCM consulted for further management.  Past Medical History:  Cirrhosis from NASH, cholecystitis complicated by persistent bile leak and infected biloma, type 2 diabetes hyperlipidemia, hypertension, migraines, neuropathy, GERD, and IBS   Significant Hospital Events:  Admitted 2/3 > for abdominal and back pain  Consults:  GI   Procedures:  PTA  ERCP 2/2 > 1 partially occluded  migrated stent with moderate main bile duct dilation and 4 plastic biliary stents were placed within the common bile duct RIJ CVL 2/11 >>  Significant Diagnostic Tests:  CT abdomen and pelvis 2/3 >  Percutaneous drainage catheter coiled in the gallbladder fossa without focal fluid collection. New 4 biliary stents extending from the porta hepatis to the duodenum.  Large colonic stool burden   CT abdomen/pelvis 2/10 new bi basal airspace disease , no residual fluid collection in gallbladder fossa, catheter in position, downstream migration of biliary stents, thickening of distal transverse colon  Micro Data:  COVID 2/3 > negative Blood cultures 2/10  > ng MRSA PCR 2/10 > negative  Antimicrobials:  Ceftriaxone 2/2 > 2/10 Zosyn 2/10 > Vancomycin 2/10  Interim History / Subjective:   More hypotensive overnight requiring increasing doses of Levophed and vasopressin added. Critically ill Febrile 103 Good urine output 1300 cc Episode of vomiting and panda to place to suction, tube feeds held   Objective   Blood pressure (!) 108/32, pulse (!) 119, temperature 98.24 F (36.8 C), resp. rate 14, height 5' 4"  (1.626 m), weight 86.2 kg, SpO2 98 %.        Intake/Output Summary (Last 24 hours) at 07/05/2020 0834 Last data filed at 07/05/2020 0710 Gross per 24 hour  Intake 7009.24 ml  Output 1300 ml  Net 5709.24 ml   Filed Weights   06/27/20 1552 07/04/20 1045 07/05/20 0413  Weight: 72.6 kg 79.7 kg 86.2 kg    Examination: General: Acutely ill-appearing, no distress HEENT: [place]/AT, core track in place, MM pink/moist, PERRL,  Neuro: Responds to name but does not consistently follow commands, no meningeal signs CV: s1s2 regular rate and rhythm, no murmur, rubs, or gallops,  PULM: No accessory muscle  use, decreased breath sounds bilateral GI: Distended, mild tenderness left lower quadrant, no guarding or rigidity Extremities: warm/dry, no edema  Skin: no rashes or lesions  Chest x-ray  2/11 independently reviewed shows bibasilar infiltrates. Labs show hypokalemia, hyperglycemia, high procalcitonin, improved hemoglobin , increasing creatinine  Resolved Hospital Problem list     Assessment & Plan:  Septic shock -Developed 2/10 , worsening 2/11 Probably due to HAP , no abdominal source identified on repeat CT  P: Titrating pressors to MAP 65 Obtain CVP and use colloids rather than crystalloids if low. Continue Zosyn, okay to DC vancomycin given MRSA PCR negative Dc PICC line  AKI -obtain urine electrolytes, but likely ATN Hypokalemia will be repleted  Acute metabolic encephalopathy -Likely multifactorial including likely opioid overdose coupled with acute illness. -Some response to Narcan 2/10 but remains somnolent 2/11 P: Minimize opiates, will allow low-dose fentanyl if absolutely needed DC [name] and Seroquel DC [name], rexulti and Topamax We will slowly reinitiate once mental status improves Check ammonia, may have to start empiric treatment for hepatic encephalopathy Frequent neurochecks Delirium precautions  Post ERCP pancreatitis History of persistent bile leak with multiple biliary stents/exchanges -Patient has had longstanding history of above since original cholecystectomy 07/2019 Hx of liver cirrhosis secondary to NASH P: Primary management per GI, appreciate assistance IR pigtail drain placed in gallbladder fossa remains in place -plan per surgery and GI Tube feeds on hold due to vomiting, suggest restart trickle   Insulin-dependent type 2 diabetes -Home medications include Basaglar, Trulicity,  Metformin and Synjardy P: SSI  Increase Levemir 10 twice daily and changed to resistant scale  Anemia of chronic disease  P: Hemoglobin of 6.7 on 2/10 appears to be falsely value since repeat has stabilized to 8.9  GERD  P: Continue PPI  Best practice (evaluated daily)  Diet: Tube feeds on hold Pain/Anxiety/Delirium protocol (if  indicated): fent As needed  VAP protocol (if indicated): N/A DVT prophylaxis: Lovenox GI prophylaxis: PPI Glucose control: SSI Mobility: Bedrest  Disposition:ICU  Goals of Care:  Last date of multidisciplinary goals of care discussion:Pending  Family and staff present: Pending  Summary of discussion: Pending Follow up goals of care discussion due: Pending  Code Status: Full  Labs   CBC: Recent Labs  Lab 07/01/20 0427 07/02/20 0446 07/03/20 0501 07/04/20 1258 07/05/20 0542 07/05/20 0636  WBC 4.4 4.5 4.7 10.9* 12.5*  --   HGB 10.3* 9.8* 9.9* 6.7* 10.5* 8.9*  HCT 35.8* 33.9* 34.2* 24.0* 35.8* 30.5*  MCV 84.4 84.1 84.7 89.2 84.8  --   PLT 149* 133* 146* 156 182  --     Basic Metabolic Panel: Recent Labs  Lab 07/01/20 0427 07/02/20 0446 07/02/20 1401 07/02/20 1630 07/03/20 0501 07/04/20 0508 07/05/20 0542  NA 140 140  --   --  143 141 140  K 3.5 3.8  --   --  4.0 3.7 3.1*  CL 109 109  --   --  112* 112* 117*  CO2 22 21*  --   --  25 19* 14*  GLUCOSE 116* 113*  --   --  126* 194* 306*  BUN 9 9  --   --  9 12 40*  CREATININE 0.43* 0.62  --   --  0.58 0.58 1.30*  CALCIUM 8.9 8.8*  --   --  8.8* 8.6* 7.7*  MG 1.7  --  1.7 1.7 1.8  --   --   PHOS  --   --  3.0 2.7  2.8  --   --    GFR: Estimated Creatinine Clearance: 51.9 mL/min (A) (by C-G formula based on SCr of 1.3 mg/dL (H)). Recent Labs  Lab 07/02/20 0446 07/03/20 0501 07/04/20 1258 07/04/20 1310 07/04/20 1438 07/04/20 1934 07/05/20 0542  PROCALCITON  --   --   --  20.40  --   --  45.61  WBC 4.5 4.7 10.9*  --   --   --  12.5*  LATICACIDVEN  --   --   --   --  3.3* 1.2  --     Liver Function Tests: Recent Labs  Lab 07/01/20 0427 07/02/20 0446 07/03/20 0501 07/04/20 0508 07/05/20 0542  AST 49* 63* 67* 61* 75*  ALT 31 38 40 38 47*  ALKPHOS 128* 133* 129* 128* 95  BILITOT 0.5 0.6 0.4 0.4 0.7  PROT 7.2 6.8 6.5 6.8 5.7*  ALBUMIN 3.7 3.6 3.5 3.6 2.8*   Recent Labs  Lab 06/29/20 0847  06/30/20 0550 07/01/20 0427 07/03/20 0507  LIPASE 1,052* 294* 59* 34   No results for input(s): AMMONIA in the last 168 hours.  ABG    Component Value Date/Time   O2SAT 86.1 07/05/2020 0636      Patient critically ill due to septic shock, encephalopathy Interventions to address this today titration of pressors, antibiotics, addressing CNS meds Risk of deterioration without these interventions is high  I personally spent 40 minutes providing critical care not including any separately billable procedures   Kara Mead MD. FCCP. [place] Pager : [phone]  If no response to pager , please call [phone] until 7 pm After 7:00 pm call Elink  [phone]     07/05/2020, 8:34 AM

## 2020-07-05 NOTE — Progress Notes (Signed)
eLink Physician-Brief Progress Note Patient Name: Kim Hicks DOB: 1966/03/05 MRN: [id number]   Date of Service  07/05/2020  HPI/Events of Note  Notified that the patient's PICC is malfunctioning and patient needs alternative central access for infusion of pressors for hypotension.   eICU Interventions  Notified bedside team of the need for insertion of a central line in this patient.     Intervention Category Intermediate Interventions: Hypotension - evaluation and management  Marily Lente [name] 07/05/2020, 3:35 AM

## 2020-07-05 NOTE — Procedures (Signed)
Central Venous Catheter Insertion Procedure Note  Kim Hicks  [id number]  14-Oct-1965  Date:07/05/20  Time:5:41 AM   Provider Performing:Seong-Joo Carson Myrtle   Procedure: Insertion of Non-tunneled Central Venous Catheter(36556) with US guidance (03491)   Indication(s) Medication administration and Difficult access  Consent Unable to obtain consent due to emergent nature of procedure.  Anesthesia Topical only with 1% lidocaine   Timeout Verified patient identification, verified procedure, site/side was marked, verified correct patient position, special equipment/implants available, medications/allergies/relevant history reviewed, required imaging and test results available.  Sterile Technique Maximal sterile technique including full sterile barrier drape, hand hygiene, sterile gown, sterile gloves, mask, hair covering, sterile ultrasound probe cover (if used).  Procedure Description Area of catheter insertion was cleaned with chlorhexidine and draped in sterile fashion.  With real-time ultrasound guidance a central venous catheter was placed into the right internal jugular vein. Nonpulsatile blood flow and easy flushing noted in all ports.  The catheter was sutured in place and sterile dressing applied.  Complications/Tolerance None; patient tolerated the procedure well. Chest X-ray is ordered to verify placement for internal jugular or subclavian cannulation.   Chest x-ray is not ordered for femoral cannulation.  EBL Minimal  Specimen(s) None   Renee Pain, MD Board Certified by the ABIM, [place]

## 2020-07-05 NOTE — Progress Notes (Signed)
Subjective: Patient arouses and talks to me but if confused.  She admits to some abdominal discomfort.  Events from overnight noted.    ROS: See above, otherwise other systems negative  Objective: Vital signs in last 24 hours: Temp:  [98.24 F (36.8 C)-103.6 F (39.8 C)] 98.24 F (36.8 C) (02/11 0700) Pulse Rate:  [96-125] 119 (02/11 0700) Resp:  [14-33] 14 (02/11 0700) BP: (53-170)/(20-131) 108/32 (02/11 0700) SpO2:  [85 %-100 %] 98 % (02/11 0700) Weight:  [79.7 kg-86.2 kg] 86.2 kg (02/11 0413) Last BM Date: 06/30/20  Intake/Output from previous day: 02/10 0701 - 02/11 0700 In: 6965.7 [I.V.:4429.4; Blood:130; NG/GT:1115.8; IV Piggyback:1290.5] Out: 1300 [Urine:1300] Intake/Output this shift: Total I/O In: 407.6 [I.V.:367.5; IV Piggyback:40.1] Out: 600 [Urine:600]  PE: Abd: soft, some distention, PANDA tube to suction somehow and had put out about 500-600cc of bilious output.  Some tenderness, but minimal, drain with no output.  Lab Results:  Recent Labs    07/04/20 1258 07/05/20 0542 07/05/20 0636  WBC 10.9* 12.5*  --   HGB 6.7* 10.5* 8.9*  HCT 24.0* 35.8* 30.5*  PLT 156 182  --    BMET Recent Labs    07/04/20 0508 07/05/20 0542  NA 141 140  K 3.7 3.1*  CL 112* 117*  CO2 19* 14*  GLUCOSE 194* 306*  BUN 12 40*  CREATININE 0.58 1.30*  CALCIUM 8.6* 7.7*   PT/INR No results for input(s): LABPROT, INR in the last 72 hours. CMP     Component Value Date/Time   NA 140 07/05/2020 0542   NA 137 12/14/2012 1255   K 3.1 (L) 07/05/2020 0542   K 4.0 12/14/2012 1255   CL 117 (H) 07/05/2020 0542   CL 104 12/14/2012 1255   CO2 14 (L) 07/05/2020 0542   CO2 27 12/14/2012 1255   GLUCOSE 306 (H) 07/05/2020 0542   GLUCOSE 233 (H) 12/14/2012 1255   BUN 40 (H) 07/05/2020 0542   BUN 17 12/14/2012 1255   CREATININE 1.30 (H) 07/05/2020 0542   CREATININE 0.46 (L) 12/14/2012 1255   CALCIUM 7.7 (L) 07/05/2020 0542   CALCIUM 9.3 12/14/2012 1255   PROT 5.7 (L)  07/05/2020 0542   PROT 7.0 05/16/2019 1154   ALBUMIN 2.8 (L) 07/05/2020 0542   AST 75 (H) 07/05/2020 0542   ALT 47 (H) 07/05/2020 0542   ALKPHOS 95 07/05/2020 0542   BILITOT 0.7 07/05/2020 0542   GFRNONAA 49 (L) 07/05/2020 0542   GFRNONAA >60 12/14/2012 1255   GFRAA >60 02/16/2020 0520   GFRAA >60 12/14/2012 1255   Lipase     Component Value Date/Time   LIPASE 34 07/03/2020 0507       Studies/Results: CT ABDOMEN PELVIS WO CONTRAST  Result Date: 07/04/2020 CLINICAL DATA:  Fever, hypotensive, anemia, abdominal pain EXAM: CT ABDOMEN AND PELVIS WITHOUT CONTRAST TECHNIQUE: Multidetector CT imaging of the abdomen and pelvis was performed following the standard protocol without IV contrast. COMPARISON:  06/27/2020 FINDINGS: Lower chest: Dependent lower lobe consolidation favor airspace disease over atelectasis. No effusion or pneumothorax. Unenhanced CT was performed per clinician order. Lack of IV contrast limits sensitivity and specificity, especially for evaluation of abdominal/pelvic solid viscera. Hepatobiliary: Nodularity of the liver capsule compatible cirrhosis. No focal intrahepatic finding on this unenhanced exam. Stable percutaneous drainage catheter within the gallbladder fossa with no evidence of recurrence or residual fluid collection. Gallbladder is surgically absent. There are 4 stents within the downstream common bile duct, extending into the  duodenal lumen. The stents have migrated distally since prior study, but are still patent with pneumobilia again identified. Pancreas: No gross abnormalities on this unenhanced exam. Spleen: Normal in size without focal abnormality. Adrenals/Urinary Tract: Adrenal glands are unremarkable. Kidneys are normal, without renal calculi, focal lesion, or hydronephrosis. Bladder is unremarkable. Stomach/Bowel: Large amount of retained stool within the distal colon consistent with constipation. There is colonic wall thickening within the distal  transverse colon through the splenic flexure, consistent with inflammatory or infectious colitis. No bowel obstruction or ileus. Enteric catheter extends through the gastric lumen, tip within the second portion duodenum. Vascular/Lymphatic: No significant vascular findings are present. No enlarged abdominal or pelvic lymph nodes. Reproductive: Status post hysterectomy. No adnexal masses. Other: No free fluid or free gas. No abdominal wall hernia. No evidence of retroperitoneal hemorrhage. Musculoskeletal: There are no acute or destructive bony lesions. Reconstructed images demonstrate no additional findings. IMPRESSION: 1. Interval development of bibasilar airspace disease, which could reflect aspiration or infection. 2. Percutaneous drainage catheter within the gallbladder fossa, with no evidence of recurrence or residual fluid collection. 3. Downstream migration of the 4 biliary stents, traversing the distal common bile duct and ampulla. The stents remain patent, with mild pneumobilia again noted. 4. Increasing fecal retention consistent with constipation. Developing bowel wall thickening of the distal transverse colon could reflect inflammatory, infectious, or stercoral colitis. 5. Cirrhosis. 6. No evidence of acute hemorrhage to explain the patient's anemia. Electronically Signed   By: Randa Ngo M.D.   On: 07/04/2020 18:53   DG CHEST PORT 1 VIEW  Result Date: 07/05/2020 CLINICAL DATA:  Central line placement. EXAM: PORTABLE CHEST 1 VIEW COMPARISON:  07/04/2020 FINDINGS: 0548 hours. Low lung volumes. The cardio pericardial silhouette is enlarged. Patchy bilateral airspace opacity again noted, right greater than left. No substantial pleural effusion. A feeding tube passes into the stomach although the distal tip position is not included on the film. Right IJ central line tip overlies the mid to low right atrium. Right PICC line tip overlies the proximal SVC. Bones are diffusely demineralized. Telemetry  leads overlie the chest. IMPRESSION: 1. Support apparatus as above. 2. Patchy bilateral airspace disease, right greater than left, similar to prior. Electronically Signed   By: Misty Stanley M.D.   On: 07/05/2020 06:16   DG CHEST PORT 1 VIEW  Result Date: 07/04/2020 CLINICAL DATA:  Hypoxia.  NG placement.  Rapid response. EXAM: PORTABLE CHEST 1 VIEW COMPARISON:  11/07/2019 FINDINGS: Feeding tube overlies the left main bronchus. This could be in the mid esophagus or in the airway. Recommend repositioning. Patchy airspace disease bilaterally right greater than left. No significant effusion. Heart size enlarged. IMPRESSION: Feeding tube overlies the left main bronchus and could be in the mid esophagus or in the airway. Recommend repositioning Bilateral airspace disease right greater than left. Possible pneumonia or edema. Electronically Signed   By: Franchot Gallo M.D.   On: 07/04/2020 10:56   DG Abd Portable 1V  Result Date: 07/04/2020 CLINICAL DATA:  Feeding tube placement EXAM: PORTABLE ABDOMEN - 1 VIEW COMPARISON:  07/02/2020 FINDINGS: Feeding tube passes through the stomach with the tip in the right upper quadrant. This could be in the duodenum or in the antrum. Biliary drain on the right. Multiple common bile duct stents are present. Normal bowel gas pattern. IMPRESSION: Feeding tube passes through the stomach. Tip in the antrum or proximal duodenum. Electronically Signed   By: Franchot Gallo M.D.   On: 07/04/2020 10:57   [place]  EKG SITE RITE  Result Date: 07/04/2020 If [facility] image not attached, placement could not be confirmed due to current cardiac rhythm.   Anti-infectives: Anti-infectives (From admission, onward)   Start     Dose/Rate Route Frequency Ordered Stop   07/04/20 1330  vancomycin (VANCOREADY) IVPB 1500 mg/300 mL  Status:  Discontinued        1,500 mg 150 mL/hr over 120 Minutes Intravenous  Once 07/04/20 1234 07/04/20 1254   07/04/20 1330  vancomycin (VANCOREADY) IVPB 1750  mg/350 mL  Status:  Discontinued        1,750 mg 175 mL/hr over 120 Minutes Intravenous Every 24 hours 07/04/20 1254 07/05/20 0821   07/04/20 1245  piperacillin-tazobactam (ZOSYN) IVPB 3.375 g        3.375 g 12.5 mL/hr over 240 Minutes Intravenous Every 8 hours 07/04/20 1233     06/27/20 2100  ciprofloxacin (CIPRO) IVPB 400 mg        400 mg 200 mL/hr over 60 Minutes Intravenous Every 12 hours 06/27/20 2057 07/02/20 0944       Assessment/Plan HTN HLD Diabetes Cirrhosiswith some encephalopathy - lactulose per medicine Septic shock - likely secondary to HAP per CCM Anemia   Persistent bile leak s/p fenestrated lap chole March 2021 by Dr. Kae Heller and now with post ERCP pancreatitis -no intra-abdominal source of sepsis noted. -drain with no output today which is great. -awaiting IR to pull back drain once patient is more stable to go for this procedure, which is non-urgent at this time -once again, significant constipation noted.  Lactulose per CCM and SMOG enema today.  Daily suppositories -no acute surgical intervention warranted at this time.  FEN - NPO currently, defer to GI VTE - Lovenox ID - none currently   LOS: 7 days    Henreitta Cea , [facility] 07/05/2020, 10:02 AM Please see Amion for pager number during day hours 7:00am-4:30pm or 7:00am -11:30am on weekends

## 2020-07-05 NOTE — Progress Notes (Addendum)
eLink Physician-Brief Progress Note Patient Name: Kim Hicks DOB: Apr 13, 1966 MRN: [id number]   Date of Service  07/05/2020  HPI/Events of Note  RN asks if OK to give pRBCs for Hgb 6.7 despite fever.  Also, asks for levophed to be changed to central line concentration.   eICU Interventions  OK to give pRBCs despite fever. Will just give 1 unit of the 2 units of pRBCs ordered.  Changed levophed order to central line concentration.   ADDENDUM 07/05/20 1:38 AM:  - RN notifies me that she cannot locate the blood consent. Asks whether or not the blood should still be transfused. RN states she has been trying to contact the patient's husband but so far has been unable to reach them.  - Given that there is no evidence of active hemorrhage and the last Hgb was only slightly below standard transfusion threshold of 7.0, we will cancel/defer the transfusion for now pending confirmation from the patient's healthcare proxy regarding transfusion consent.  - If an emergent indication for transfusion were to arise in the interim, this decision would need to be re-evaluated.   Intervention Category Intermediate Interventions: Other:;Hypotension - evaluation and management  Charlott Rakes 07/05/2020, 1:07 AM

## 2020-07-05 NOTE — Progress Notes (Signed)
eLink Physician-Brief Progress Note Patient Name: Kim Hicks DOB: 06-06-1965 MRN: [id number]   Date of Service  07/05/2020  HPI/Events of Note  K 3.1. NPO. Has central line. Cr 1.3.  eICU Interventions  Ordered 20 mEq KCl IV.     Intervention Category Intermediate Interventions: Electrolyte abnormality - evaluation and management  Charlott Rakes 07/05/2020, 6:32 AM

## 2020-07-05 NOTE — Progress Notes (Signed)
Patient's husband [name] in to visit with [name]. This RN placed 3 silver rings and 2 silver bracelets in a plastic bag and gave to husband to take home.

## 2020-07-06 DIAGNOSIS — A419 Sepsis, unspecified organism: Secondary | ICD-10-CM | POA: Diagnosis not present

## 2020-07-06 DIAGNOSIS — J9601 Acute respiratory failure with hypoxia: Secondary | ICD-10-CM | POA: Diagnosis not present

## 2020-07-06 DIAGNOSIS — R6521 Severe sepsis with septic shock: Secondary | ICD-10-CM | POA: Diagnosis not present

## 2020-07-06 DIAGNOSIS — R1011 Right upper quadrant pain: Secondary | ICD-10-CM | POA: Diagnosis not present

## 2020-07-06 DIAGNOSIS — K859 Acute pancreatitis without necrosis or infection, unspecified: Secondary | ICD-10-CM | POA: Diagnosis not present

## 2020-07-06 LAB — BLOOD GAS, VENOUS
Acid-base deficit: 5.7 mmol/L — ABNORMAL HIGH (ref 0.0–2.0)
Bicarbonate: 18.9 mmol/L — ABNORMAL LOW (ref 20.0–28.0)
FIO2: 21
O2 Saturation: 76.2 %
Patient temperature: 98.6
pCO2, Ven: 35.7 mmHg — ABNORMAL LOW (ref 44.0–60.0)
pH, Ven: 7.343 (ref 7.250–7.430)
pO2, Ven: 42.7 mmHg (ref 32.0–45.0)

## 2020-07-06 LAB — GLUCOSE, CAPILLARY
Glucose-Capillary: 95 mg/dL (ref 70–99)
Glucose-Capillary: 112 mg/dL — ABNORMAL HIGH (ref 70–99)
Glucose-Capillary: 99 mg/dL (ref 70–99)
Glucose-Capillary: 108 mg/dL — ABNORMAL HIGH (ref 70–99)
Glucose-Capillary: 156 mg/dL — ABNORMAL HIGH (ref 70–99)
Glucose-Capillary: 131 mg/dL — ABNORMAL HIGH (ref 70–99)
Glucose-Capillary: 128 mg/dL — ABNORMAL HIGH (ref 70–99)

## 2020-07-06 LAB — COMPREHENSIVE METABOLIC PANEL
ALT: 41 U/L (ref 0–44)
AST: 59 U/L — ABNORMAL HIGH (ref 15–41)
Albumin: 3 g/dL — ABNORMAL LOW (ref 3.5–5.0)
Alkaline Phosphatase: 79 U/L (ref 38–126)
Anion gap: 10 (ref 5–15)
BUN: 43 mg/dL — ABNORMAL HIGH (ref 6–20)
CO2: 16 mmol/L — ABNORMAL LOW (ref 22–32)
Calcium: 7.5 mg/dL — ABNORMAL LOW (ref 8.9–10.3)
Chloride: 113 mmol/L — ABNORMAL HIGH (ref 98–111)
Creatinine, Ser: 1.09 mg/dL — ABNORMAL HIGH (ref 0.44–1.00)
GFR, Estimated: 60 mL/min — ABNORMAL LOW (ref >60)
Glucose, Bld: 125 mg/dL — ABNORMAL HIGH (ref 70–99)
Potassium: 2.5 mmol/L — CL (ref 3.5–5.1)
Sodium: 139 mmol/L (ref 135–145)
Total Bilirubin: 0.6 mg/dL (ref 0.3–1.2)
Total Protein: 5.7 g/dL — ABNORMAL LOW (ref 6.5–8.1)

## 2020-07-06 LAB — CBC
HCT: 33.6 % — ABNORMAL LOW (ref 36.0–46.0)
Hemoglobin: 10.1 g/dL — ABNORMAL LOW (ref 12.0–15.0)
MCH: 24.6 pg — ABNORMAL LOW (ref 26.0–34.0)
MCHC: 30.1 g/dL (ref 30.0–36.0)
MCV: 81.8 fL (ref 80.0–100.0)
Platelets: 149 10*3/uL — ABNORMAL LOW (ref 150–400)
RBC: 4.11 MIL/uL (ref 3.87–5.11)
RDW: 17 % — ABNORMAL HIGH (ref 11.5–15.5)
WBC: 7.4 10*3/uL (ref 4.0–10.5)
nRBC: 0 % (ref 0.0–0.2)

## 2020-07-06 LAB — BASIC METABOLIC PANEL
Anion gap: 9 (ref 5–15)
Anion gap: 9 (ref 5–15)
BUN: 21 mg/dL — ABNORMAL HIGH (ref 6–20)
BUN: 25 mg/dL — ABNORMAL HIGH (ref 6–20)
CO2: 15 mmol/L — ABNORMAL LOW (ref 22–32)
CO2: 19 mmol/L — ABNORMAL LOW (ref 22–32)
Calcium: 6.7 mg/dL — ABNORMAL LOW (ref 8.9–10.3)
Calcium: 7.7 mg/dL — ABNORMAL LOW (ref 8.9–10.3)
Chloride: 116 mmol/L — ABNORMAL HIGH (ref 98–111)
Chloride: 113 mmol/L — ABNORMAL HIGH (ref 98–111)
Creatinine, Ser: 0.65 mg/dL (ref 0.44–1.00)
Creatinine, Ser: 0.63 mg/dL (ref 0.44–1.00)
GFR, Estimated: >60 mL/min (ref >60)
GFR, Estimated: >60 mL/min (ref >60)
Glucose, Bld: 570 mg/dL (ref 70–99)
Glucose, Bld: 144 mg/dL — ABNORMAL HIGH (ref 70–99)
Potassium: 2.3 mmol/L — CL (ref 3.5–5.1)
Potassium: 2.4 mmol/L — CL (ref 3.5–5.1)
Sodium: 140 mmol/L (ref 135–145)
Sodium: 141 mmol/L (ref 135–145)

## 2020-07-06 LAB — PROCALCITONIN: Procalcitonin: 40.69 ng/mL

## 2020-07-06 LAB — MAGNESIUM: Magnesium: 2 mg/dL (ref 1.7–2.4)

## 2020-07-06 LAB — AMMONIA: Ammonia: 62 umol/L — ABNORMAL HIGH (ref 9–35)

## 2020-07-06 MED ORDER — POTASSIUM CHLORIDE 10 MEQ/50ML IV SOLN
10.0000 meq | INTRAVENOUS | Status: AC
  Administered 2020-07-07 (×4): 10 meq via INTRAVENOUS
  Filled 2020-07-06 (×4): qty 50

## 2020-07-06 MED ORDER — DEXTROSE IN LACTATED RINGERS 5 % IV SOLN: INTRAVENOUS | Status: DC

## 2020-07-06 MED ORDER — POTASSIUM CHLORIDE 20 MEQ PO PACK
40.0000 meq | PACK | Freq: Once | ORAL | Status: AC
  Administered 2020-07-07: 40 meq
  Filled 2020-07-06: qty 2

## 2020-07-06 MED ORDER — POTASSIUM CHLORIDE 10 MEQ/50ML IV SOLN: 10.0000 meq | INTRAVENOUS | Status: DC

## 2020-07-06 MED ORDER — POTASSIUM CHLORIDE 10 MEQ/50ML IV SOLN
10.0000 meq | INTRAVENOUS | Status: AC
  Administered 2020-07-06 (×4): 10 meq via INTRAVENOUS
  Filled 2020-07-06 (×4): qty 50

## 2020-07-06 MED ORDER — POTASSIUM CHLORIDE 10 MEQ/50ML IV SOLN
10.0000 meq | INTRAVENOUS | Status: AC
  Administered 2020-07-06 (×2): 10 meq via INTRAVENOUS
  Filled 2020-07-06 (×2): qty 50

## 2020-07-06 MED ORDER — POTASSIUM CHLORIDE 20 MEQ PO PACK
40.0000 meq | PACK | Freq: Once | ORAL | Status: AC
  Administered 2020-07-06: 40 meq
  Filled 2020-07-06: qty 2

## 2020-07-06 NOTE — Progress Notes (Signed)
[facility] Progress Note Patient Name: Kim Hicks DOB: [birth year] MRN: [id number]   Date of Service  07/06/2020  HPI/Events of Note  K+ 2.5  eICU Interventions  Adult electrolyte replacement protocol for K+ ordered.        Kerry Kass Nekoda Chock 07/06/2020, 6:04 AM

## 2020-07-06 NOTE — Progress Notes (Signed)
CRITICAL VALUE ALERT  Critical Value:  K+ 2.3; glucose 570  Date & Time Notied:  07/06/20 2140  Provider Notified: E-link notified  Orders Received/Actions taken: rechecked glucose via CBG, resulted 131; will redraw BMP & mag; orders for IV potassium

## 2020-07-06 NOTE — Progress Notes (Signed)
CRITICAL VALUE STICKER  CRITICAL VALUE: potassium 2.5  RECEIVER (on-site recipient of call):  DATE & TIME NOTIFIED: 07/06/20 0522  MESSENGER (representative from lab):  MD NOTIFIED: Myrtle: [phone]  RESPONSE: awaiting

## 2020-07-06 NOTE — Progress Notes (Signed)
Assessment & Plan:  Persistent bile leak s/p fenestrated lap chole March 2021 - Dr. Romana Juniper  Small thin bilious output from RUQ drain overnight  Plan drain change on Monday 2/14 with plans to slowly advance drain out over time  HTN HLD Diabetes Cirrhosis Anemia        Kim Gemma, MD       [facility]       Office: [phone]   Chief Complaint: Persistent bile leak after lap chole  Subjective: Patient sleeping, hard to arouse; appears comfortable  Objective: Vital signs in last 24 hours: Temp:  [98.4 F (36.9 C)-101.66 F (38.7 C)] 100.04 F (37.8 C) (02/12 0600) Pulse Rate:  [27-121] 27 (02/12 0600) Resp:  [14-31] 24 (02/12 0600) BP: (56-151)/(22-118) 106/49 (02/12 0600) SpO2:  [86 %-100 %] 99 % (02/12 0600) Weight:  [80.2 kg] 80.2 kg (02/12 0408) Last BM Date: 07/05/20  Intake/Output from previous day: 02/11 0701 - 02/12 0700 In: 2386.6 [I.V.:1433.9; NG/GT:700; IV Piggyback:252.7] Out: 3520 [Urine:3350; Drains:20; Stool:150] Intake/Output this shift: No intake/output data recorded.  Physical Exam: HEENT - sclerae clear, mucous membranes moist Neck - soft Abdomen - soft, obese; RUQ drain with thin bilious in bag - 10cc overnight per nurse  Lab Results:  Recent Labs    07/05/20 0542 07/05/20 0636 07/06/20 0411  WBC 12.5*  --  7.4  HGB 10.5* 8.9* 10.1*  HCT 35.8* 30.5* 33.6*  PLT 182  --  149*   BMET Recent Labs    07/05/20 0542 07/06/20 0411  NA 140 139  K 3.1* 2.5*  CL 117* 113*  CO2 14* 16*  GLUCOSE 306* 125*  BUN 40* 43*  CREATININE 1.30* 1.09*  CALCIUM 7.7* 7.5*   PT/INR No results for input(s): LABPROT, INR in the last 72 hours. Comprehensive Metabolic Panel:    Component Value Date/Time   NA 139 07/06/2020 0411   NA 140 07/05/2020 0542   NA 137 12/14/2012 1255   K 2.5 (LL) 07/06/2020 0411   K 3.1 (L) 07/05/2020 0542   K 4.0 12/14/2012 1255   CL 113 (H) 07/06/2020 0411   CL 117 (H)  07/05/2020 0542   CL 104 12/14/2012 1255   CO2 16 (L) 07/06/2020 0411   CO2 14 (L) 07/05/2020 0542   CO2 27 12/14/2012 1255   BUN 43 (H) 07/06/2020 0411   BUN 40 (H) 07/05/2020 0542   BUN 17 12/14/2012 1255   CREATININE 1.09 (H) 07/06/2020 0411   CREATININE 1.30 (H) 07/05/2020 0542   CREATININE 0.46 (L) 12/14/2012 1255   GLUCOSE 125 (H) 07/06/2020 0411   GLUCOSE 306 (H) 07/05/2020 0542   GLUCOSE 233 (H) 12/14/2012 1255   CALCIUM 7.5 (L) 07/06/2020 0411   CALCIUM 7.7 (L) 07/05/2020 0542   CALCIUM 9.3 12/14/2012 1255   AST 59 (H) 07/06/2020 0411   AST 75 (H) 07/05/2020 0542   ALT 41 07/06/2020 0411   ALT 47 (H) 07/05/2020 0542   ALKPHOS 79 07/06/2020 0411   ALKPHOS 95 07/05/2020 0542   BILITOT 0.6 07/06/2020 0411   BILITOT 0.7 07/05/2020 0542   PROT 5.7 (L) 07/06/2020 0411   PROT 5.7 (L) 07/05/2020 0542   PROT 7.0 05/16/2019 1154   ALBUMIN 3.0 (L) 07/06/2020 0411   ALBUMIN 2.8 (L) 07/05/2020 0542    Studies/Results: CT ABDOMEN PELVIS WO CONTRAST  Result Date: 07/04/2020 CLINICAL DATA:  Fever, hypotensive, anemia, abdominal pain EXAM: CT ABDOMEN AND PELVIS WITHOUT CONTRAST TECHNIQUE: Multidetector CT  imaging of the abdomen and pelvis was performed following the standard protocol without IV contrast. COMPARISON:  06/27/2020 FINDINGS: Lower chest: Dependent lower lobe consolidation favor airspace disease over atelectasis. No effusion or pneumothorax. Unenhanced CT was performed per clinician order. Lack of IV contrast limits sensitivity and specificity, especially for evaluation of abdominal/pelvic solid viscera. Hepatobiliary: Nodularity of the liver capsule compatible cirrhosis. No focal intrahepatic finding on this unenhanced exam. Stable percutaneous drainage catheter within the gallbladder fossa with no evidence of recurrence or residual fluid collection. Gallbladder is surgically absent. There are 4 stents within the downstream common bile duct, extending into the duodenal lumen.  The stents have migrated distally since prior study, but are still patent with pneumobilia again identified. Pancreas: No gross abnormalities on this unenhanced exam. Spleen: Normal in size without focal abnormality. Adrenals/Urinary Tract: Adrenal glands are unremarkable. Kidneys are normal, without renal calculi, focal lesion, or hydronephrosis. Bladder is unremarkable. Stomach/Bowel: Large amount of retained stool within the distal colon consistent with constipation. There is colonic wall thickening within the distal transverse colon through the splenic flexure, consistent with inflammatory or infectious colitis. No bowel obstruction or ileus. Enteric catheter extends through the gastric lumen, tip within the second portion duodenum. Vascular/Lymphatic: No significant vascular findings are present. No enlarged abdominal or pelvic lymph nodes. Reproductive: Status post hysterectomy. No adnexal masses. Other: No free fluid or free gas. No abdominal wall hernia. No evidence of retroperitoneal hemorrhage. Musculoskeletal: There are no acute or destructive bony lesions. Reconstructed images demonstrate no additional findings. IMPRESSION: 1. Interval development of bibasilar airspace disease, which could reflect aspiration or infection. 2. Percutaneous drainage catheter within the gallbladder fossa, with no evidence of recurrence or residual fluid collection. 3. Downstream migration of the 4 biliary stents, traversing the distal common bile duct and ampulla. The stents remain patent, with mild pneumobilia again noted. 4. Increasing fecal retention consistent with constipation. Developing bowel wall thickening of the distal transverse colon could reflect inflammatory, infectious, or stercoral colitis. 5. Cirrhosis. 6. No evidence of acute hemorrhage to explain the patient's anemia. Electronically Signed   By: Kim Hicks M.D.   On: 07/04/2020 18:53   DG CHEST PORT 1 VIEW  Result Date: 07/05/2020 CLINICAL DATA:   Central line placement. EXAM: PORTABLE CHEST 1 VIEW COMPARISON:  07/04/2020 FINDINGS: 0548 hours. Low lung volumes. The cardio pericardial silhouette is enlarged. Patchy bilateral airspace opacity again noted, right greater than left. No substantial pleural effusion. A feeding tube passes into the stomach although the distal tip position is not included on the film. Right IJ central line tip overlies the mid to low right atrium. Right PICC line tip overlies the proximal SVC. Bones are diffusely demineralized. Telemetry leads overlie the chest. IMPRESSION: 1. Support apparatus as above. 2. Patchy bilateral airspace disease, right greater than left, similar to prior. Electronically Signed   By: Misty Stanley M.D.   On: 07/05/2020 06:16   DG CHEST PORT 1 VIEW  Result Date: 07/04/2020 CLINICAL DATA:  Hypoxia.  NG placement.  Rapid response. EXAM: PORTABLE CHEST 1 VIEW COMPARISON:  11/07/2019 FINDINGS: Feeding tube overlies the left main bronchus. This could be in the mid esophagus or in the airway. Recommend repositioning. Patchy airspace disease bilaterally right greater than left. No significant effusion. Heart size enlarged. IMPRESSION: Feeding tube overlies the left main bronchus and could be in the mid esophagus or in the airway. Recommend repositioning Bilateral airspace disease right greater than left. Possible pneumonia or edema. Electronically Signed   By:  Franchot Gallo M.D.   On: 07/04/2020 10:56   DG Abd Portable 1V  Result Date: 07/04/2020 CLINICAL DATA:  Feeding tube placement EXAM: PORTABLE ABDOMEN - 1 VIEW COMPARISON:  07/02/2020 FINDINGS: Feeding tube passes through the stomach with the tip in the right upper quadrant. This could be in the duodenum or in the antrum. Biliary drain on the right. Multiple common bile duct stents are present. Normal bowel gas pattern. IMPRESSION: Feeding tube passes through the stomach. Tip in the antrum or proximal duodenum. Electronically Signed   By: Franchot Gallo M.D.   On: 07/04/2020 10:57   [place] EKG SITE RITE  Result Date: 07/04/2020 If Site Rite image not attached, placement could not be confirmed due to current cardiac rhythm.     Kim Hicks 07/06/2020  Patient ID: Kim Hicks, female   DOB: 04/05/1966, 55 y.o.   MRN: [id number]

## 2020-07-06 NOTE — Progress Notes (Signed)
NAME:  Kim Hicks, MRN:  [id number], DOB:  1966-03-04, LOS: 8 ADMISSION DATE:  06/27/2020, CONSULTATION DATE: 07/04/2020 REFERRING MD: Dr. Pricilla Handler, CHIEF COMPLAINT: Hypertension altered mental status  Brief History:  55 year old female with NASH & known persistent bile leak after undergoing laparoscopic cholecystectomy 08/04/2019   Admitted with post ERCP pancreatitis 2/2 she has been followed by infectious disease and GI.  PCCM consult 2/10 due to hypotension and somnolence >> some improvement with Narcan  History of Present Illness:  Kim Hicks is a 55 year old female with an extensive past medical history significant for but not limited to cirrhosis from NASH, cholecystitis complicated by persistent bile leak and infected biloma, type 2 diabetes hyperlipidemia, hypertension, migraines, neuropathy, GERD, and IBS who presented for acute complaints of abdominal and low back pain that began after outpatient ERCP performed 2/2.  Outpatient GI recommended patient presented to the ED for further evaluation.  Patient has known persistent bile leak after undergoing laparoscopic cholecystectomy 08/04/2019 for chronic cholecystitis.  She has underwent multiple ERCPs and stent placement/stent exchanges.  Most recent stent exchange was 05/27/2020 by ERCP.    Morning of 2/10 patient was seen somnolent with associated hypotension and hypoxia.  This prompted rapid response called and administration of Narcan given recent dose of Dilaudid.  Mentation improved with Narcan dosing.  Blood pressure slightly improved with fluid bolus.  PCCM consulted for further management.  Past Medical History:  Cirrhosis from NASH, cholecystitis complicated by persistent bile leak and infected biloma, type 2 diabetes hyperlipidemia, hypertension, migraines, neuropathy, GERD, and IBS   Significant Hospital Events:  Admitted 2/3 > for abdominal and back pain  Consults:  GI   Procedures:  PTA  ERCP 2/2 > 1 partially occluded  migrated stent with moderate main bile duct dilation and 4 plastic biliary stents were placed within the common bile duct RIJ CVL 2/11 >>  Significant Diagnostic Tests:  CT abdomen and pelvis 2/3 >  Percutaneous drainage catheter coiled in the gallbladder fossa without focal fluid collection. New 4 biliary stents extending from the porta hepatis to the duodenum.  Large colonic stool burden   CT abdomen/pelvis 2/10 new bi basal airspace disease , no residual fluid collection in gallbladder fossa, catheter in position, downstream migration of biliary stents, thickening of distal transverse colon  Micro Data:  COVID 2/3 > negative Blood cultures 2/10  > neg MRSA PCR 2/10 > negative  Antimicrobials:  Ceftriaxone 2/2 > 2/10 Zosyn 2/10 >>> Vancomycin 2/10 >>>   Scheduled Meds: . bisacodyl  10 mg Rectal Daily  . Chlorhexidine Gluconate Cloth  6 each Topical Daily  . enoxaparin (LOVENOX) injection  40 mg Subcutaneous Daily  . feeding supplement (PROSource TF)  45 mL Per Tube TID  . free water  100 mL Per Tube Q4H  . insulin aspart  0-20 Units Subcutaneous Q4H  . insulin detemir  10 Units Subcutaneous BID  . lactulose  20 g Per Tube TID  . mouth rinse  15 mL Mouth Rinse BID  . pantoprazole sodium  40 mg Per Tube QHS  . pregabalin  100 mg Oral TID  . sennosides  5 mL Oral QHS  . sodium chloride flush  10-40 mL Intracatheter Q12H   Continuous Infusions: . sodium chloride Stopped (07/05/20 0617)  . sodium chloride Stopped (07/06/20 0546)  . sodium chloride Stopped (07/05/20 0104)  . dextrose 5% lactated ringers 50 mL/hr at 07/06/20 0536  . feeding supplement (OSMOLITE 1.5 CAL) Stopped (07/04/20 1100)  . lactated  ringers    . norepinephrine (LEVOPHED) Adult infusion 17 mcg/min (07/06/20 0848)  . piperacillin-tazobactam (ZOSYN)  IV 12.5 mL/hr at 07/06/20 0827  . potassium chloride 10 mEq (07/06/20 0922)  . vasopressin 0.03 Units/min (07/06/20 0827)   PRN Meds:.sodium chloride,  acetaminophen **OR** acetaminophen, naLOXone (NARCAN)  injection, sodium chloride flush    Interim History / Subjective:  More somnolent last 24 h     Objective   Blood pressure (!) 135/54, pulse 99, temperature 99.86 F (37.7 C), temperature source Bladder, resp. rate 19, height 5' 4"  (1.626 m), weight 80.2 kg, SpO2 99 %. CVP:  [5 mmHg-12 mmHg] 8 mmHg      Intake/Output Summary (Last 24 hours) at 07/06/2020 0930 Last data filed at 07/06/2020 0827 Gross per 24 hour  Intake 2416.63 ml  Output 2920 ml  Net -503.37 ml   Filed Weights   07/04/20 1045 07/05/20 0413 07/06/20 0408  Weight: 79.7 kg 86.2 kg 80.2 kg    Examination: General:   4lpm NP  sats 97% Tmax 101.7 (downward trend)  Pt not responding to verbal, does have spont movements all 4, some jerking  No jvd NG clamped off  Neck supple Lungs with a few scattered exp > insp rhonchi bilaterally RRR no s3 or or sign murmur Abd obese soft with nl  excursion  Extr warm with no edema or clubbing noted    I personally reviewed images and agree with radiology impression as follows:  CXR:   2/12  Portable 1. Support apparatus as above. 2. Patchy bilateral airspace disease, right greater than left, similar to prior     Resolved Hospital Problem list     Assessment & Plan:  Septic shock  -Developed 2/10 , worsening 2/11 Probably due to HAP , no abdominal source identified on repeat CT - still levophed/vasopressin dep am 2/12   P: goal MAP 65 See microflow/abx flow sheets above Vol expand with d5LR as tolerated but risk of making gas exchange worse so monitor closely     AKI with non AG met acidosis  Lab Results  Component Value Date   CREATININE 1.09 (H) 07/06/2020   CREATININE 1.30 (H) 07/05/2020   CREATININE 0.58 07/04/2020   - urine electrolytes with high Urine na so very  high FeNa -  Changed fluids to LR 2/12    Hypokalemia  K 2.5 am 2/12  - rx in progress, repeat level when runs done   Acute  metabolic encephalopathy -Likely multifactorial including likely opioid overdose coupled with acute illness. -Some response to Narcan 2/10 but remains somnolent 2/11 P: Minimize opiates, will allow low-dose fentanyl if absolutely needed DCdGeodon and Seroquel DCd [name], rexulti and Topamax >>> d/c lyrica am 2/12  And check ammonia/ continue lactulose    Post ERCP pancreatitis History of persistent bile leak with multiple biliary stents/exchanges -Patient has had longstanding history of above since original cholecystectomy 07/2019 Hx of liver cirrhosis secondary to NASH P: Primary management per GI  IR pigtail drain placed in gallbladder fossa remains in place -plan per surgery and GI Tube feeds on hold due to vomiting / ams   Insulin-dependent type 2 diabetes -Home medications include Basaglar, Trulicity,  Metformin and Synjardy P: Presently on  Levemir 10 twice daily and   resistant  SSI - blood sugars low side so changed to d5LR 2/12    Anemia of chronic disease  Lab Results  Component Value Date   HGB 10.1 (L) 07/06/2020   HGB 8.9 (L) 07/05/2020  HGB 10.5 (L) 07/05/2020   HGB 14.7 12/14/2012    P: Trending better     GERD  P: Continue PPI  Best practice (evaluated daily)  Diet: Tube feeds on hold Pain/Anxiety/Delirium protocol (if indicated): fent As needed  VAP protocol (if indicated): N/A DVT prophylaxis: Lovenox GI prophylaxis: PPI Glucose control: SSI Mobility: Bedrest  Disposition:ICU  Goals of Care:  Last date of multidisciplinary goals of care discussion:Pending  Family and staff present: Pending  Summary of discussion: Pending Follow up goals of care discussion due: Pending  Code Status: Full Updated husband 2/12, doing everything we can to turn things around for her but prognosis very guarded   Labs   CBC: Recent Labs  Lab 07/02/20 0446 07/03/20 0501 07/04/20 1258 07/05/20 0542 07/05/20 0636 07/06/20 0411  WBC 4.5 4.7 10.9* 12.5*   --  7.4  HGB 9.8* 9.9* 6.7* 10.5* 8.9* 10.1*  HCT 33.9* 34.2* 24.0* 35.8* 30.5* 33.6*  MCV 84.1 84.7 89.2 84.8  --  81.8  PLT 133* 146* 156 182  --  149*    Basic Metabolic Panel: Recent Labs  Lab 07/01/20 0427 07/02/20 0446 07/02/20 1401 07/02/20 1630 07/03/20 0501 07/04/20 0508 07/05/20 0542 07/06/20 0411  NA 140 140  --   --  143 141 140 139  K 3.5 3.8  --   --  4.0 3.7 3.1* 2.5*  CL 109 109  --   --  112* 112* 117* 113*  CO2 22 21*  --   --  25 19* 14* 16*  GLUCOSE 116* 113*  --   --  126* 194* 306* 125*  BUN 9 9  --   --  9 12 40* 43*  CREATININE 0.43* 0.62  --   --  0.58 0.58 1.30* 1.09*  CALCIUM 8.9 8.8*  --   --  8.8* 8.6* 7.7* 7.5*  MG 1.7  --  1.7 1.7 1.8  --   --   --   PHOS  --   --  3.0 2.7 2.8  --   --   --    GFR: Estimated Creatinine Clearance: 59.7 mL/min (A) (by C-G formula based on SCr of 1.09 mg/dL (H)). Recent Labs  Lab 07/03/20 0501 07/04/20 1258 07/04/20 1310 07/04/20 1438 07/04/20 1934 07/05/20 0542 07/06/20 0411  PROCALCITON  --   --  20.40  --   --  45.61 40.69  WBC 4.7 10.9*  --   --   --  12.5* 7.4  LATICACIDVEN  --   --   --  3.3* 1.2  --   --     Liver Function Tests: Recent Labs  Lab 07/02/20 0446 07/03/20 0501 07/04/20 0508 07/05/20 0542 07/06/20 0411  AST 63* 67* 61* 75* 59*  ALT 38 40 38 47* 41  ALKPHOS 133* 129* 128* 95 79  BILITOT 0.6 0.4 0.4 0.7 0.6  PROT 6.8 6.5 6.8 5.7* 5.7*  ALBUMIN 3.6 3.5 3.6 2.8* 3.0*   Recent Labs  Lab 06/30/20 0550 07/01/20 0427 07/03/20 0507  LIPASE 294* 59* 34   No results for input(s): AMMONIA in the last 168 hours.  ABG    Component Value Date/Time   O2SAT 86.1 07/05/2020 0636       The patient is critically ill with multiple organ systems failure and requires high complexity decision making for assessment and support, frequent evaluation and titration of therapies, application of advanced monitoring technologies and extensive interpretation of multiple databases. Critical  Care Time devoted  to patient care services described in this note is 45 minutes.     Christinia Gully, MD [facility] [place] [phone]   After 7:00 pm call Elink  [phone]

## 2020-07-06 NOTE — Progress Notes (Signed)
[facility] Progress Note    Since last GI note: She is on two pressors, breathing comfortably, sleeping in ICU bed currently.  Enemas were effective at stimulating her bowels and now she has copious liquid stools. RN planning flexiseal.  No overt bleeding.   Coretrak feedings have been on hold for 2 days due to presumed aspiration event.  Drain output very minimal in past 2 days.  Objective: Vital signs in last 24 hours: Temp:  [98.6 F (37 C)-101.66 F (38.7 C)] 100.04 F (37.8 C) (02/12 0600) Pulse Rate:  [27-121] 27 (02/12 0600) Resp:  [14-31] 24 (02/12 0600) BP: (56-151)/(22-118) 106/49 (02/12 0600) SpO2:  [86 %-100 %] 99 % (02/12 0600) Weight:  [80.2 kg] 80.2 kg (02/12 0408) Last BM Date: 07/05/20 General: asleep Heart: regular rate and rythm  Lab Results: Recent Labs    07/04/20 1258 07/05/20 0542 07/05/20 0636 07/06/20 0411  WBC 10.9* 12.5*  --  7.4  HGB 6.7* 10.5* 8.9* 10.1*  PLT 156 182  --  149*  MCV 89.2 84.8  --  81.8   Recent Labs    07/04/20 0508 07/05/20 0542 07/06/20 0411  NA 141 140 139  K 3.7 3.1* 2.5*  CL 112* 117* 113*  CO2 19* 14* 16*  GLUCOSE 194* 306* 125*  BUN 12 40* 43*  CREATININE 0.58 1.30* 1.09*  CALCIUM 8.6* 7.7* 7.5*   Recent Labs    07/04/20 0508 07/05/20 0542 07/06/20 0411  PROT 6.8 5.7* 5.7*  ALBUMIN 3.6 2.8* 3.0*  AST 61* 75* 59*  ALT 38 47* 41  ALKPHOS 128* 95 79  BILITOT 0.4 0.7 0.6   No results for input(s): INR in the last 72 hours.   Studies/Results: CT ABDOMEN PELVIS WO CONTRAST  Result Date: 07/04/2020 CLINICAL DATA:  Fever, hypotensive, anemia, abdominal pain EXAM: CT ABDOMEN AND PELVIS WITHOUT CONTRAST TECHNIQUE: Multidetector CT imaging of the abdomen and pelvis was performed following the standard protocol without IV contrast. COMPARISON:  06/27/2020 FINDINGS: Lower chest: Dependent lower lobe consolidation favor airspace disease over atelectasis. No effusion or pneumothorax. Unenhanced CT  was performed per clinician order. Lack of IV contrast limits sensitivity and specificity, especially for evaluation of abdominal/pelvic solid viscera. Hepatobiliary: Nodularity of the liver capsule compatible cirrhosis. No focal intrahepatic finding on this unenhanced exam. Stable percutaneous drainage catheter within the gallbladder fossa with no evidence of recurrence or residual fluid collection. Gallbladder is surgically absent. There are 4 stents within the downstream common bile duct, extending into the duodenal lumen. The stents have migrated distally since prior study, but are still patent with pneumobilia again identified. Pancreas: No gross abnormalities on this unenhanced exam. Spleen: Normal in size without focal abnormality. Adrenals/Urinary Tract: Adrenal glands are unremarkable. Kidneys are normal, without renal calculi, focal lesion, or hydronephrosis. Bladder is unremarkable. Stomach/Bowel: Large amount of retained stool within the distal colon consistent with constipation. There is colonic wall thickening within the distal transverse colon through the splenic flexure, consistent with inflammatory or infectious colitis. No bowel obstruction or ileus. Enteric catheter extends through the gastric lumen, tip within the second portion duodenum. Vascular/Lymphatic: No significant vascular findings are present. No enlarged abdominal or pelvic lymph nodes. Reproductive: Status post hysterectomy. No adnexal masses. Other: No free fluid or free gas. No abdominal wall hernia. No evidence of retroperitoneal hemorrhage. Musculoskeletal: There are no acute or destructive bony lesions. Reconstructed images demonstrate no additional findings. IMPRESSION: 1. Interval development of bibasilar airspace disease, which could reflect aspiration or infection. 2.  Percutaneous drainage catheter within the gallbladder fossa, with no evidence of recurrence or residual fluid collection. 3. Downstream migration of the 4  biliary stents, traversing the distal common bile duct and ampulla. The stents remain patent, with mild pneumobilia again noted. 4. Increasing fecal retention consistent with constipation. Developing bowel wall thickening of the distal transverse colon could reflect inflammatory, infectious, or stercoral colitis. 5. Cirrhosis. 6. No evidence of acute hemorrhage to explain the patient's anemia. Electronically Signed   By: Randa Ngo M.D.   On: 07/04/2020 18:53   DG CHEST PORT 1 VIEW  Result Date: 07/05/2020 CLINICAL DATA:  Central line placement. EXAM: PORTABLE CHEST 1 VIEW COMPARISON:  07/04/2020 FINDINGS: 0548 hours. Low lung volumes. The cardio pericardial silhouette is enlarged. Patchy bilateral airspace opacity again noted, right greater than left. No substantial pleural effusion. A feeding tube passes into the stomach although the distal tip position is not included on the film. Right IJ central line tip overlies the mid to low right atrium. Right PICC line tip overlies the proximal SVC. Bones are diffusely demineralized. Telemetry leads overlie the chest. IMPRESSION: 1. Support apparatus as above. 2. Patchy bilateral airspace disease, right greater than left, similar to prior. Electronically Signed   By: Misty Stanley M.D.   On: 07/05/2020 06:16   DG CHEST PORT 1 VIEW  Result Date: 07/04/2020 CLINICAL DATA:  Hypoxia.  NG placement.  Rapid response. EXAM: PORTABLE CHEST 1 VIEW COMPARISON:  11/07/2019 FINDINGS: Feeding tube overlies the left main bronchus. This could be in the mid esophagus or in the airway. Recommend repositioning. Patchy airspace disease bilaterally right greater than left. No significant effusion. Heart size enlarged. IMPRESSION: Feeding tube overlies the left main bronchus and could be in the mid esophagus or in the airway. Recommend repositioning Bilateral airspace disease right greater than left. Possible pneumonia or edema. Electronically Signed   By: Franchot Gallo M.D.   On:  07/04/2020 10:56   DG Abd Portable 1V  Result Date: 07/04/2020 CLINICAL DATA:  Feeding tube placement EXAM: PORTABLE ABDOMEN - 1 VIEW COMPARISON:  07/02/2020 FINDINGS: Feeding tube passes through the stomach with the tip in the right upper quadrant. This could be in the duodenum or in the antrum. Biliary drain on the right. Multiple common bile duct stents are present. Normal bowel gas pattern. IMPRESSION: Feeding tube passes through the stomach. Tip in the antrum or proximal duodenum. Electronically Signed   By: Franchot Gallo M.D.   On: 07/04/2020 10:57   [place] EKG SITE RITE  Result Date: 07/04/2020 If Site Rite image not attached, placement could not be confirmed due to current cardiac rhythm.   Medications: Scheduled Meds: . bisacodyl  10 mg Rectal Daily  . Chlorhexidine Gluconate Cloth  6 each Topical Daily  . enoxaparin (LOVENOX) injection  40 mg Subcutaneous Daily  . feeding supplement (PROSource TF)  45 mL Per Tube TID  . free water  100 mL Per Tube Q4H  . insulin aspart  0-20 Units Subcutaneous Q4H  . insulin detemir  10 Units Subcutaneous BID  . lactulose  20 g Per Tube TID  . mouth rinse  15 mL Mouth Rinse BID  . pantoprazole sodium  40 mg Per Tube QHS  . potassium chloride  40 mEq Per Tube Once  . pregabalin  100 mg Oral TID  . sennosides  5 mL Oral QHS  . sodium chloride flush  10-40 mL Intracatheter Q12H   Continuous Infusions: . sodium chloride Stopped (07/05/20 0617)  .  sodium chloride Stopped (07/06/20 0546)  . sodium chloride Stopped (07/05/20 0104)  . dextrose 5% lactated ringers 50 mL/hr at 07/06/20 0536  . feeding supplement (OSMOLITE 1.5 CAL) Stopped (07/04/20 1100)  . lactated ringers    . norepinephrine (LEVOPHED) Adult infusion 17 mcg/min (07/06/20 0629)  . piperacillin-tazobactam (ZOSYN)  IV 12.5 mL/hr at 07/06/20 0629  . potassium chloride    . vasopressin 0.03 Units/min (07/06/20 0629)   PRN Meds:.sodium chloride, acetaminophen **OR** acetaminophen,  naLOXone (NARCAN)  injection, sodium chloride flush    Assessment/Plan: 55 y.o. female with chronic bile leak, post ERCP pancreatitis, hosp acquired pneumonia (likely aspiration related), cirrhosis  Her bile leak has persisted since 07/2019 lap chole. She had her 5th ERCP 10 days ago with placement of 4 plastic biliary stents in her CBD. Fortunately it appears that this is helping the leak, her RUQ drain output has decreased to nearly zero over the past several days.  IR is planning to change the drain on Monday and then slowly remove the new drain over time, pending clinical course.  Pneumonia presumed from aspiration related to initial Coretrak malposition and possibly opiod induced lethargy. She is on two pressors and IV antibiotics (zosyn) for sepsis like picture.  The coretrak feedings (started for post ERCP pancreatitis, inability to eat) have been held for 2 days now.  I think retrial of feedings is reasonable under crit care guidance.    Started lactulose yesterday for cirrhosis, constipation.  Should continue for now.  [facility] will plan to see her again on Monday but we are available 24/7 if needed for any questions or concerns.  Milus Banister, MD  07/06/2020, 8:14 AM [facility] Pager [phone]

## 2020-07-06 NOTE — Progress Notes (Addendum)
eLink Physician-Brief Progress Note Patient Name: STEPHONIE WILCOXEN DOB: 1966/02/12 MRN: [id number]   Date of Service  07/06/2020  HPI/Events of Note  K 2.5 this AM in setting of significant enteral losses due to enemas (she is having copious liquid stools per GI note). Cr was 1.09 (downtrending) and she received KCl 40 mEq PO x1 and KCl 40 mEq IV this morning.  No repeat BMP ordered yet.   eICU Interventions  STAT BMP and Mg ordered.  Also ordered VBG and ammonia level for increased somnolence.  Suspect she will need additional repletion.   ADDENDUM 07/06/20 9:46 PM   - BMP shows K of 2.3 but also shows glucose 570. CBG checked and was normal (100-200 range). Per RN, D5 LR had been infusing through the port. Suspect that specimen was contaminated by this despite wasting volume first. Will repeat BMP draw.  - Despite suspected contamination, it is very likely the patient is indeed hypokalemic. I have therefore ordered 20 mEq KCl IV to be given empirically while awaiting repeat BMP result.   ADDENDUM 07/06/20 11:30 PM  - Repeat BMP with normal glucose value, Na 141, K 2.4, HCO3 19, Cr 0.63.  - Ordered an additional 40 mEq KCl IV (for 60 mEq total) + 40 mEq KCl per tube.  - Recheck BMP twice daily, next at 0500 hrs.   Intervention Category Intermediate Interventions: Electrolyte abnormality - evaluation and management  Charlott Rakes 07/06/2020, 8:28 PM

## 2020-07-06 NOTE — Progress Notes (Signed)
CRITICAL VALUE ALERT  Critical Value:  K+ 2.4  Date & Time Notied:  07/06/20 2313  Provider Notified: E-link notified  Orders Received/Actions taken: will continue runs of K, will continue to monitor

## 2020-07-06 NOTE — Progress Notes (Signed)
eLink Physician-Brief Progress Note Patient Name: Kim Hicks DOB: 19-Mar-1966 MRN: [id number]   Date of Service  07/06/2020  HPI/Events of Note  CBG level trending down, with the last reading being 95 mg / dl, patient's enteral nutrition is on hold. Most recent K+ is 3.1.  eICU Interventions  D 5 % LR ordered at 50 ml  / hour pending resumption of enteral nutrition, it is ordered for 12 hours but can be discontinued any time after enteral nutrition is resumed.        Kerry Kass [name] 07/06/2020, 5:32 AM

## 2020-07-07 ENCOUNTER — Inpatient Hospital Stay (HOSPITAL_COMMUNITY): Payer: Medicare Other

## 2020-07-07 DIAGNOSIS — R6521 Severe sepsis with septic shock: Secondary | ICD-10-CM | POA: Diagnosis not present

## 2020-07-07 DIAGNOSIS — K859 Acute pancreatitis without necrosis or infection, unspecified: Secondary | ICD-10-CM | POA: Diagnosis not present

## 2020-07-07 DIAGNOSIS — A419 Sepsis, unspecified organism: Secondary | ICD-10-CM | POA: Diagnosis not present

## 2020-07-07 LAB — GLUCOSE, CAPILLARY
Glucose-Capillary: 114 mg/dL — ABNORMAL HIGH (ref 70–99)
Glucose-Capillary: 116 mg/dL — ABNORMAL HIGH (ref 70–99)
Glucose-Capillary: 118 mg/dL — ABNORMAL HIGH (ref 70–99)
Glucose-Capillary: 119 mg/dL — ABNORMAL HIGH (ref 70–99)
Glucose-Capillary: 127 mg/dL — ABNORMAL HIGH (ref 70–99)
Glucose-Capillary: 146 mg/dL — ABNORMAL HIGH (ref 70–99)

## 2020-07-07 LAB — BASIC METABOLIC PANEL
Anion gap: 6 (ref 5–15)
BUN: 15 mg/dL (ref 6–20)
CO2: 20 mmol/L — ABNORMAL LOW (ref 22–32)
Calcium: 6.9 mg/dL — ABNORMAL LOW (ref 8.9–10.3)
Chloride: 115 mmol/L — ABNORMAL HIGH (ref 98–111)
Creatinine, Ser: 0.37 mg/dL — ABNORMAL LOW (ref 0.44–1.00)
GFR, Estimated: >60 mL/min (ref >60)
Glucose, Bld: 127 mg/dL — ABNORMAL HIGH (ref 70–99)
Potassium: 2.7 mmol/L — CL (ref 3.5–5.1)
Sodium: 141 mmol/L (ref 135–145)

## 2020-07-07 LAB — COMPREHENSIVE METABOLIC PANEL
ALT: 34 U/L (ref 0–44)
AST: 50 U/L — ABNORMAL HIGH (ref 15–41)
Albumin: 2.9 g/dL — ABNORMAL LOW (ref 3.5–5.0)
Alkaline Phosphatase: 89 U/L (ref 38–126)
Anion gap: 8 (ref 5–15)
BUN: 20 mg/dL (ref 6–20)
CO2: 20 mmol/L — ABNORMAL LOW (ref 22–32)
Calcium: 7.8 mg/dL — ABNORMAL LOW (ref 8.9–10.3)
Chloride: 112 mmol/L — ABNORMAL HIGH (ref 98–111)
Creatinine, Ser: 0.43 mg/dL — ABNORMAL LOW (ref 0.44–1.00)
GFR, Estimated: >60 mL/min (ref >60)
Glucose, Bld: 119 mg/dL — ABNORMAL HIGH (ref 70–99)
Potassium: 3.2 mmol/L — ABNORMAL LOW (ref 3.5–5.1)
Sodium: 140 mmol/L (ref 135–145)
Total Bilirubin: 1.1 mg/dL (ref 0.3–1.2)
Total Protein: 5.9 g/dL — ABNORMAL LOW (ref 6.5–8.1)

## 2020-07-07 LAB — CBC
HCT: 28.6 % — ABNORMAL LOW (ref 36.0–46.0)
Hemoglobin: 9 g/dL — ABNORMAL LOW (ref 12.0–15.0)
MCH: 24.3 pg — ABNORMAL LOW (ref 26.0–34.0)
MCHC: 31.5 g/dL (ref 30.0–36.0)
MCV: 77.3 fL — ABNORMAL LOW (ref 80.0–100.0)
Platelets: 133 10*3/uL — ABNORMAL LOW (ref 150–400)
RBC: 3.7 MIL/uL — ABNORMAL LOW (ref 3.87–5.11)
RDW: 16.7 % — ABNORMAL HIGH (ref 11.5–15.5)
WBC: 6.1 10*3/uL (ref 4.0–10.5)
nRBC: 0 % (ref 0.0–0.2)

## 2020-07-07 LAB — AMMONIA: Ammonia: 46 umol/L — ABNORMAL HIGH (ref 9–35)

## 2020-07-07 LAB — MAGNESIUM: Magnesium: 1.8 mg/dL (ref 1.7–2.4)

## 2020-07-07 MED ORDER — GERHARDT'S BUTT CREAM
TOPICAL_CREAM | Freq: Two times a day (BID) | CUTANEOUS | Status: DC
  Administered 2020-07-08: 1 via TOPICAL
  Filled 2020-07-07: qty 1

## 2020-07-07 MED ORDER — POTASSIUM CHLORIDE 10 MEQ/100ML IV SOLN
10.0000 meq | INTRAVENOUS | Status: AC
  Administered 2020-07-07 (×4): 10 meq via INTRAVENOUS
  Filled 2020-07-07 (×4): qty 100

## 2020-07-07 MED ORDER — POTASSIUM CHLORIDE 10 MEQ/50ML IV SOLN
10.0000 meq | INTRAVENOUS | Status: AC
  Administered 2020-07-07 – 2020-07-08 (×8): 10 meq via INTRAVENOUS
  Filled 2020-07-07 (×7): qty 50

## 2020-07-07 MED ORDER — MAGNESIUM SULFATE IN D5W 1-5 GM/100ML-% IV SOLN
1.0000 g | Freq: Once | INTRAVENOUS | Status: AC
  Administered 2020-07-07: 1 g via INTRAVENOUS
  Filled 2020-07-07: qty 100

## 2020-07-07 NOTE — Progress Notes (Signed)
NAME:  Kim Hicks, MRN:  [id number], DOB:  1966-01-27, LOS: 9 ADMISSION DATE:  06/27/2020, CONSULTATION DATE: 07/04/2020 REFERRING MD: Dr. Pricilla Handler, CHIEF COMPLAINT: Hypertension /altered mental status  Brief History:  55 year old female with NASH & known persistent bile leak after undergoing laparoscopic cholecystectomy 08/04/2019   Admitted with post ERCP pancreatitis 2/2 she has been followed by infectious disease and GI.  PCCM consult 2/10 due to hypotension and somnolence >> some improvement with Narcan  History of Present Illness:  Kim Hicks is a 55 year old female with an extensive past medical history significant for but not limited to cirrhosis from NASH, cholecystitis complicated by persistent bile leak and infected biloma, type 2 diabetes hyperlipidemia, hypertension, migraines, neuropathy, GERD, and IBS who presented for acute complaints of abdominal and low back pain that began after outpatient ERCP performed 2/2.  Outpatient GI recommended patient presented to the ED for further evaluation.  Patient has known persistent bile leak after undergoing laparoscopic cholecystectomy 08/04/2019 for chronic cholecystitis.  She has underwent multiple ERCPs and stent placement/stent exchanges.  Most recent stent exchange was 05/27/2020 by ERCP.    Morning of 2/10 patient was seen somnolent with associated hypotension and hypoxia.  This prompted rapid response called and administration of Narcan given recent dose of Dilaudid.  Mentation improved with Narcan dosing.  Blood pressure slightly improved with fluid bolus.  PCCM consulted for further management.  Past Medical History:  Cirrhosis from NASH, cholecystitis complicated by persistent bile leak and infected biloma, type 2 diabetes hyperlipidemia, hypertension, migraines, neuropathy, GERD, and IBS   Significant Hospital Events:  Admitted 2/3 > for abdominal and back pain  Consults:  GI  Medicine GI surgery   Procedures:  ERCP prior to  admit 2/2 > 1 partially occluded migrated stent with moderate main bile duct dilation and 4 plastic biliary stents were placed within the common bile duct RIJ CVL 2/11 >>  Significant Diagnostic Tests:  CT abdomen and pelvis 2/3 >  Percutaneous drainage catheter coiled in the gallbladder fossa without focal fluid collection. New 4 biliary stents extending from the porta hepatis to the duodenum.  Large colonic stool burden   CT abdomen/pelvis 2/10 new bi basal airspace disease , no residual fluid collection in gallbladder fossa, catheter in position, downstream migration of biliary stents, thickening of distal transverse colon  Micro Data:  COVID 2/3 > negative Blood cultures 2/10  > neg MRSA PCR 2/10 > negative  Antimicrobials:  Ceftriaxone 2/2 > 2/10 Vancomycin 2/10 only  Zosyn 2/10 >>>   Scheduled Meds: . bisacodyl  10 mg Rectal Daily  . Chlorhexidine Gluconate Cloth  6 each Topical Daily  . enoxaparin (LOVENOX) injection  40 mg Subcutaneous Daily  . feeding supplement (PROSource TF)  45 mL Per Tube TID  . free water  100 mL Per Tube Q4H  . insulin aspart  0-20 Units Subcutaneous Q4H  . insulin detemir  10 Units Subcutaneous BID  . lactulose  20 g Per Tube TID  . mouth rinse  15 mL Mouth Rinse BID  . pantoprazole sodium  40 mg Per Tube QHS  . sennosides  5 mL Oral QHS  . sodium chloride flush  10-40 mL Intracatheter Q12H   Continuous Infusions: . sodium chloride 10 mL/hr at 07/07/20 0831  . sodium chloride Stopped (07/05/20 0104)  . dextrose 5% lactated ringers 100 mL/hr at 07/06/20 2311  . feeding supplement (OSMOLITE 1.5 CAL) Stopped (07/04/20 1100)  . lactated ringers    . norepinephrine (LEVOPHED)  Adult infusion 4 mcg/min (07/07/20 0831)  . piperacillin-tazobactam (ZOSYN)  IV 12.5 mL/hr at 07/07/20 0831  . vasopressin Stopped (07/06/20 1600)   PRN Meds:.sodium chloride, acetaminophen **OR** acetaminophen, naLOXone (NARCAN)  injection, sodium chloride flush     Interim History / Subjective:  Much more alert/ still on low dose levophed but overall clearly improved      Objective   Blood pressure 132/62, pulse 94, temperature 98.7 F (37.1 C), temperature source Axillary, resp. rate 19, height 5' 4"  (1.626 m), weight 77.3 kg, SpO2 97 %. CVP:  [5 mmHg-14 mmHg] 14 mmHg      Intake/Output Summary (Last 24 hours) at 07/07/2020 0933 Last data filed at 07/07/2020 0831 Gross per 24 hour  Intake 3352.07 ml  Output 3860 ml  Net -507.93 ml   Filed Weights   07/05/20 0413 07/06/20 0408 07/07/20 0500  Weight: 86.2 kg 80.2 kg 77.3 kg    Examination: General:    sats are 97% on RA  Tmax  100.4 (continuing downward trend) Pt more alert, makes eye contact but not consistently f/c  No jvd Neck supple Lungs with min  exp > insp rhonchi bilaterally RRR no s3 or or sign murmur Abd mod distended but soft, nl excursion  Extr warm with no edema or clubbing noted             Resolved Hospital Problem list     Assessment & Plan:  Septic shock  -Developed 2/10 , worsening 2/11 Probably due to HAP , no abdominal source identified on repeat CT - ? Still  levophed dep am 2/13 despite adequate cvp > try to wean off  P: goal MAP 65 See microflow/abx flow sheets above Vol expanded with d5LR 2/12  but still down 500 cc am 2/13  > continue as gas exchange is excellent >>> recheck cxr am 2/14   AKI with non AG met acidosis  Lab Results  Component Value Date   CREATININE 0.43 (L) 07/07/2020   CREATININE 0.63 07/06/2020   CREATININE 0.65 07/06/2020   - urine electrolytes with high Urine na so very  high FeNa -  Changed fluids to LR 2/12 > continue    Hypokalemia  K 3.2 am 2/13 >>> continue to replace per icu /elink protocols  Acute metabolic encephalopathy -Likely multifactorial including likely opioid overdose coupled with acute illness. -Some response to Narcan 2/10   P: Minimize opiates, ok for  fentanyl if absolutely needed DCd  [name] and Seroquel DCd [name], rexulti and Topamax - ammonia level 2/12 = 62  >>> d/cd lyrica am 2/12  And more alert am 2/13     Post ERCP pancreatitis History of persistent bile leak with multiple biliary stents/exchanges -Patient has had longstanding history of above since original cholecystectomy 07/2019 Hx of liver cirrhosis secondary to NASH P: Primary management per GI  IR pigtail drain placed in gallbladder fossa remains in place -plan per surgery and GI Tube feeds on hold due to vomiting / ams   Insulin-dependent type 2 diabetes -Home medications include Basaglar, Trulicity,  Metformin and Synjardy P: Presently on  Levemir 10 twice daily and  resistant  SSI - blood sugars low side so changed to d5LR 2/12  And continue with good control am 2/13   Anemia of chronic disease  Lab Results  Component Value Date   HGB 9.0 (L) 07/07/2020   HGB 10.1 (L) 07/06/2020   HGB 8.9 (L) 07/05/2020   HGB 14.7 12/14/2012    P: Continue  to monitor, no obvious source of bleeding on lovenox     GERD  P: Continue PPI  Best practice (evaluated daily)  Diet: Tube feeds on hold Pain/Anxiety/Delirium protocol (if indicated): fent As needed sparingly  VAP protocol (if indicated): N/A DVT prophylaxis: Lovenox GI prophylaxis: PPI Glucose control: SSI Mobility: Bedrest  Disposition:ICU  Goals of Care:  Last date of multidisciplinary goals of care discussion:Pending  Family and staff present: Pending  Summary of discussion: Pending Follow up goals of care discussion due: Pending  Code Status: Full Updated husband 2/13 at bedside   Labs   CBC: Recent Labs  Lab 07/03/20 0501 07/04/20 1258 07/05/20 0542 07/05/20 0636 07/06/20 0411 07/07/20 0740  WBC 4.7 10.9* 12.5*  --  7.4 6.1  HGB 9.9* 6.7* 10.5* 8.9* 10.1* 9.0*  HCT 34.2* 24.0* 35.8* 30.5* 33.6* 28.6*  MCV 84.7 89.2 84.8  --  81.8 77.3*  PLT 146* 156 182  --  149* 133*    Basic Metabolic Panel: Recent Labs  Lab  07/01/20 0427 07/02/20 0446 07/02/20 1401 07/02/20 1630 07/03/20 0501 07/04/20 0508 07/05/20 0542 07/06/20 0411 07/06/20 2044 07/06/20 2207 07/07/20 0740  NA 140   < >  --   --  143   < > 140 139 140 141 140  K 3.5   < >  --   --  4.0   < > 3.1* 2.5* 2.3* 2.4* 3.2*  CL 109   < >  --   --  112*   < > 117* 113* 116* 113* 112*  CO2 22   < >  --   --  25   < > 14* 16* 15* 19* 20*  GLUCOSE 116*   < >  --   --  126*   < > 306* 125* 570* 144* 119*  BUN 9   < >  --   --  9   < > 40* 43* 21* 25* 20  CREATININE 0.43*   < >  --   --  0.58   < > 1.30* 1.09* 0.65 0.63 0.43*  CALCIUM 8.9   < >  --   --  8.8*   < > 7.7* 7.5* 6.7* 7.7* 7.8*  MG 1.7  --  1.7 1.7 1.8  --   --   --  2.0  --   --   PHOS  --   --  3.0 2.7 2.8  --   --   --   --   --   --    < > = values in this interval not displayed.   GFR: Estimated Creatinine Clearance: 79.9 mL/min (A) (by C-G formula based on SCr of 0.43 mg/dL (L)). Recent Labs  Lab 07/04/20 1258 07/04/20 1310 07/04/20 1438 07/04/20 1934 07/05/20 0542 07/06/20 0411 07/07/20 0740  PROCALCITON  --  20.40  --   --  45.61 40.69  --   WBC 10.9*  --   --   --  12.5* 7.4 6.1  LATICACIDVEN  --   --  3.3* 1.2  --   --   --     Liver Function Tests: Recent Labs  Lab 07/03/20 0501 07/04/20 0508 07/05/20 0542 07/06/20 0411 07/07/20 0740  AST 67* 61* 75* 59* 50*  ALT 40 38 47* 41 34  ALKPHOS 129* 128* 95 79 89  BILITOT 0.4 0.4 0.7 0.6 1.1  PROT 6.5 6.8 5.7* 5.7* 5.9*  ALBUMIN 3.5 3.6 2.8* 3.0* 2.9*   Recent  Labs  Lab 07/01/20 0427 07/03/20 0507  LIPASE 59* 34   Recent Labs  Lab 07/06/20 2043  AMMONIA 62*    ABG    Component Value Date/Time   HCO3 18.9 (L) 07/06/2020 2100   ACIDBASEDEF 5.7 (H) 07/06/2020 2100   O2SAT 76.2 07/06/2020 2100      Christinia Gully, MD Pulmonary and [place] [facility] [phone]   After 7:00 pm call Elink  [phone]

## 2020-07-07 NOTE — Progress Notes (Signed)
eLink Physician-Brief Progress Note Patient Name: Kim Hicks DOB: [birth year] MRN: [id number]   Date of Service  07/07/2020  HPI/Events of Note  Mag at 1.8  eICU Interventions  1 gm mag sulfate ordered     Intervention Category Minor Interventions: Electrolytes abnormality - evaluation and management  Elmer Sow 07/07/2020, 9:31 PM

## 2020-07-07 NOTE — Progress Notes (Addendum)
Pharmacy Antibiotic Note  Kim Hicks is a 55 y.o. female admitted on 06/27/2020 with abdominal pain, bile leak, s/p ERCP with stenting on 2/2.  On 2/10 she developed somnolence requiring Narcan, hypoxia, hypotension, fever, and CXR with possible pneumonia.  Pharmacy has been consulted for Vancomycin and Zosyn dosing. Vanc d/c'd 2/11 after negative MRSA PCR and SCr doubling from 0.58 to 1.3, SCr has since decreased back to patent's normal  Day 4 zosyn WBC WNL SCr now back to WNL Tmax 100 on 2/12 AM, afebrile since  Plan: Continue Zosyn 3.375g IV Q8H infused over 4hrs. What is plan for LOT with Zosyn - 5 days? Follow up renal function, culture results, and clinical course.   Height: 5' 4"  (162.6 cm) Weight: 77.3 kg (170 lb 6.7 oz) IBW/kg (Calculated) : 54.7  Temp (24hrs), Avg:98.7 F (37.1 C), Min:97.6 F (36.4 C), Max:99.6 F (37.6 C)  Recent Labs  Lab 07/03/20 0501 07/04/20 0508 07/04/20 1258 07/04/20 1438 07/04/20 1934 07/05/20 0542 07/06/20 0411 07/06/20 2044 07/06/20 2207 07/07/20 0740  WBC 4.7  --  10.9*  --   --  12.5* 7.4  --   --  6.1  CREATININE 0.58   < >  --   --   --  1.30* 1.09* 0.65 0.63 0.43*  LATICACIDVEN  --   --   --  3.3* 1.2  --   --   --   --   --    < > = values in this interval not displayed.    Estimated Creatinine Clearance: 79.9 mL/min (A) (by C-G formula based on SCr of 0.43 mg/dL (L)).    Allergies  Allergen Reactions  . Talwin [Pentazocine]     Turned blue around lips and face, rash all over body    Antimicrobials this admission: 2/3 Cipro >> 2/8 2/10 Vancomycin >> 2/11 2/10 Zosyn >>  Dose adjustments this admission:   Microbiology results: 2/3 Covid: neg 2/10 MRSA PCR:  2/10 BCx: ngtd Thank you for allowing pharmacy to be a part of this patient's care.  Adrian Saran, PharmD, BCPS 07/07/2020 11:43 AM

## 2020-07-07 NOTE — Progress Notes (Signed)
eLink Physician-Brief Progress Note Patient Name: Kim Hicks DOB: 16-Feb-1966 MRN: [id number]   Date of Service  07/07/2020  HPI/Events of Note  4:30pm K+ still low  2.7  , post AM replacement.   has central line, was wondering if we should check Mag, if you order mag we might be able to call lab and have them to add it on possbily to the 4:30pm lab     RN asking for flexi-seal- 6 stools today  eICU Interventions  - Kcl IV replacement and flexi seal ordered Follow K level/BMP in AM.   Discussed with bed side RN.     Intervention Category Intermediate Interventions: Electrolyte abnormality - evaluation and management  Elmer Sow 07/07/2020, 8:06 PM

## 2020-07-07 NOTE — Progress Notes (Signed)
eLink Physician-Brief Progress Note Patient Name: Kim Hicks DOB: August 06, 1965 MRN: [id number]   Date of Service  07/07/2020  HPI/Events of Note  Discussed with RN; Asking for lap belt, already on mittents. Camera: Rolling on side to side. Alarms go off. VS stable  Sepsis, encephalopathy. On nasal o2.    eICU Interventions  Lap belt ordered. Fall precautions Asp precautions     Intervention Category Intermediate Interventions: Other:  Elmer Sow 07/07/2020, 11:04 PM

## 2020-07-08 ENCOUNTER — Inpatient Hospital Stay (HOSPITAL_COMMUNITY)
Admit: 2020-07-08 | Discharge: 2020-07-08 | Disposition: A | Discharge status: Home / Self Care | Payer: Medicare Other | Attending: Student in an Organized Health Care Education/Training Program | Admitting: Student in an Organized Health Care Education/Training Program

## 2020-07-08 ENCOUNTER — Inpatient Hospital Stay (HOSPITAL_COMMUNITY): Payer: Medicare Other

## 2020-07-08 DIAGNOSIS — R4182 Altered mental status, unspecified: Secondary | ICD-10-CM

## 2020-07-08 DIAGNOSIS — G928 Other toxic encephalopathy: Secondary | ICD-10-CM

## 2020-07-08 DIAGNOSIS — G9341 Metabolic encephalopathy: Secondary | ICD-10-CM

## 2020-07-08 DIAGNOSIS — K7581 Nonalcoholic steatohepatitis (NASH): Secondary | ICD-10-CM | POA: Diagnosis not present

## 2020-07-08 DIAGNOSIS — K839 Disease of biliary tract, unspecified: Secondary | ICD-10-CM | POA: Diagnosis not present

## 2020-07-08 DIAGNOSIS — J9601 Acute respiratory failure with hypoxia: Secondary | ICD-10-CM | POA: Diagnosis not present

## 2020-07-08 LAB — BPAM RBC
Blood Product Expiration Date: 202203122359
Blood Product Expiration Date: 202203122359
ISSUE DATE / TIME: 202202110130
Unit Type and Rh: 5100
Unit Type and Rh: 5100

## 2020-07-08 LAB — GLUCOSE, CAPILLARY
Glucose-Capillary: 109 mg/dL — ABNORMAL HIGH (ref 70–99)
Glucose-Capillary: 98 mg/dL (ref 70–99)
Glucose-Capillary: 139 mg/dL — ABNORMAL HIGH (ref 70–99)
Glucose-Capillary: 120 mg/dL — ABNORMAL HIGH (ref 70–99)

## 2020-07-08 LAB — BASIC METABOLIC PANEL
Anion gap: 9 (ref 5–15)
Anion gap: 8 (ref 5–15)
BUN: 12 mg/dL (ref 6–20)
BUN: 9 mg/dL (ref 6–20)
CO2: 21 mmol/L — ABNORMAL LOW (ref 22–32)
CO2: 20 mmol/L — ABNORMAL LOW (ref 22–32)
Calcium: 7.8 mg/dL — ABNORMAL LOW (ref 8.9–10.3)
Calcium: 7.6 mg/dL — ABNORMAL LOW (ref 8.9–10.3)
Chloride: 112 mmol/L — ABNORMAL HIGH (ref 98–111)
Chloride: 112 mmol/L — ABNORMAL HIGH (ref 98–111)
Creatinine, Ser: 0.35 mg/dL — ABNORMAL LOW (ref 0.44–1.00)
Creatinine, Ser: 0.34 mg/dL — ABNORMAL LOW (ref 0.44–1.00)
GFR, Estimated: >60 mL/min (ref >60)
GFR, Estimated: >60 mL/min (ref >60)
Glucose, Bld: 114 mg/dL — ABNORMAL HIGH (ref 70–99)
Glucose, Bld: 142 mg/dL — ABNORMAL HIGH (ref 70–99)
Potassium: 3.1 mmol/L — ABNORMAL LOW (ref 3.5–5.1)
Potassium: 3.2 mmol/L — ABNORMAL LOW (ref 3.5–5.1)
Sodium: 142 mmol/L (ref 135–145)
Sodium: 140 mmol/L (ref 135–145)

## 2020-07-08 LAB — CBC
HCT: 27.6 % — ABNORMAL LOW (ref 36.0–46.0)
Hemoglobin: 8.7 g/dL — ABNORMAL LOW (ref 12.0–15.0)
MCH: 24.6 pg — ABNORMAL LOW (ref 26.0–34.0)
MCHC: 31.5 g/dL (ref 30.0–36.0)
MCV: 78.2 fL — ABNORMAL LOW (ref 80.0–100.0)
Platelets: 107 10*3/uL — ABNORMAL LOW (ref 150–400)
RBC: 3.53 MIL/uL — ABNORMAL LOW (ref 3.87–5.11)
RDW: 16.9 % — ABNORMAL HIGH (ref 11.5–15.5)
WBC: 4 10*3/uL (ref 4.0–10.5)
nRBC: 0 % (ref 0.0–0.2)

## 2020-07-08 LAB — TYPE AND SCREEN
ABO/RH(D): O POS
Antibody Screen: NEGATIVE
Unit division: 0
Unit division: 0

## 2020-07-08 LAB — TSH: TSH: 2.609 u[IU]/mL (ref 0.350–4.500)

## 2020-07-08 LAB — VITAMIN B12: Vitamin B-12: 1484 pg/mL — ABNORMAL HIGH (ref 180–914)

## 2020-07-08 LAB — PHOSPHORUS: Phosphorus: <1 mg/dL — CL (ref 2.5–4.6)

## 2020-07-08 MED ORDER — POTASSIUM PHOSPHATES 15 MMOLE/5ML IV SOLN
30.0000 mmol | Freq: Once | INTRAVENOUS | Status: AC
  Administered 2020-07-08: 30 mmol via INTRAVENOUS
  Filled 2020-07-08: qty 10

## 2020-07-08 MED ORDER — LACTULOSE 10 GM/15ML PO SOLN
20.0000 g | Freq: Two times a day (BID) | ORAL | Status: DC
  Administered 2020-07-08 – 2020-07-10 (×4): 20 g
  Filled 2020-07-08 (×4): qty 30

## 2020-07-08 MED ORDER — POTASSIUM CHLORIDE 10 MEQ/50ML IV SOLN
10.0000 meq | INTRAVENOUS | Status: AC
  Administered 2020-07-08 (×4): 10 meq via INTRAVENOUS
  Filled 2020-07-08 (×4): qty 50

## 2020-07-08 MED ORDER — ZINC OXIDE 40 % EX OINT
TOPICAL_OINTMENT | Freq: Three times a day (TID) | CUTANEOUS | Status: DC
  Administered 2020-07-10 – 2020-07-12 (×6): 1 via TOPICAL
  Filled 2020-07-08 (×3): qty 57

## 2020-07-08 MED ORDER — POTASSIUM CHLORIDE 20 MEQ PO PACK
40.0000 meq | PACK | Freq: Once | ORAL | Status: AC
  Administered 2020-07-08: 40 meq
  Filled 2020-07-08: qty 2

## 2020-07-08 MED ORDER — ONDANSETRON HCL 4 MG/2ML IJ SOLN
4.0000 mg | Freq: Four times a day (QID) | INTRAMUSCULAR | Status: DC | PRN
  Administered 2020-07-08 – 2020-07-12 (×7): 4 mg via INTRAVENOUS
  Filled 2020-07-08 (×7): qty 2

## 2020-07-08 NOTE — Progress Notes (Signed)
eLink Physician-Brief Progress Note Patient Name: Kim Hicks DOB: [birth year] MRN: [id number]   Date of Service  07/08/2020  HPI/Events of Note  Hypokalemia - K+ = 3.2 and Creatinine = 0.34.  eICU Interventions  Will replace K+.      Intervention Category Major Interventions: Electrolyte abnormality - evaluation and management  Lysle Dingwall 07/08/2020, 7:43 PM

## 2020-07-08 NOTE — Progress Notes (Signed)
CRITICAL VALUE ALERT  Critical Value: Phosphorus less than 1.0  Date & Time Notied:  7:42 AM 07/08/2020  Provider Notified: Marni Griffon, NP  Orders Received/Actions taken: New orders received. See MAR.

## 2020-07-08 NOTE — TOC Progression Note (Signed)
Transition of Care [facility]) - Progression Note    Patient Details  Name: Kim Hicks MRN: [id number] Date of Birth: 05-Jan-1966  Transition of Care [facility]) CM/SW Contact  Leeroy Cha, RN Phone Number: 07/08/2020, 8:01 AM  Clinical Narrative:     55 y.o. female past medical history of cirrhosis of the liver, diabetes, bilateral neuropathy, hypertension, hyperlipidemia, anxiety, presenting to the hospital for evaluation of abdominal pain and low back pain on 06/27/2020 and has since been admitted to the hospital. She was admitted for a known persistent bile leak after undergoing a laparoscopic cholecystectomy on 08/04/2019, admitted on due to post ERCP pancreatitis and has been followed by infectious disease and GI.  On 07/04/2020, she had hypotension and increased somnolence with some improvement with Narcan, and was transferred to the ICU to the critical care service. Neurology was consulted today because of concern for receptive aphasia-inability to follow commands which was noted at shift change 7 AM on 07/07/2020. Chart review reveals that the impression from the critical care team is that the patient is somewhat better in terms of her mentation but the RN tells me that she was given a handoff that the patient was noted to be receptively aphasic, for which a head CT was performed which was unremarkable and neurological consultation was obtained. Patient is unable to provide any clear history at this time.  LKW: Symptoms noted at 7 AM on 07/07/2020.  Last known well unknown. tpa given?: no, outside the window Premorbid modified Rankin scale (mRS): Unable to ascertain from the patient  ROS: Unable to obtain due to altered mental status PLAN: UNCLEAR AT THIS TIME DUE TO MEDICAL CONDITION AND WORKUP, FROM HOME       Expected Discharge Plan and Services                                                 Social Determinants of Health (SDOH) Interventions    Readmission Risk  Interventions No flowsheet data found.

## 2020-07-08 NOTE — Progress Notes (Signed)
[place] Progress Note Patient Name: Kim Hicks DOB: [birth year] MRN: [id number]   Date of Service  07/08/2020  HPI/Events of Note  Dr Rory Percy conveyed his recommendation for encephalopathy. Notes seen.  eICU Interventions  Follow MRI/EEG in AM.      Intervention Category Intermediate Interventions: Other:  Elmer Sow 07/08/2020, 12:44 AM

## 2020-07-08 NOTE — Progress Notes (Signed)
Progress Note  Chief Complaint:   pancreatitis     ASSESSMENT / PLAN:    # Post-ERCP pancreatitis. She is s/p multiple biliary stents / exchanges for persistent bile duck leak post cholecystectomy in March 2021. Four stents placed on 06/26/20. Only a small amount of output in perc drain ( 88 ml yesterday) so stents must be working.  CCS following. -- Later today IR to exchange drain for a straight onen which can then be slowly pulled back.  --Her small bowel feeding tubes are on hold for now ( for IR procedure? ). Hopefully resume later today --No complaints of abdominal pain at present Abdomen doesn't appear overly tender.   # Sepsis related to aspiration ( during period of encephalopathy) vrs hospital acquired PNA.  --On Zosyn   # Persistent encephalopathy probably multifactorial ( illness, medications, possibly HE). Not on opioids,  benzos or other sedating meds now.  May have component of HE. Ammonia 46 today.  --Getting EEG in a few minutes --MRI later today per Neuro --I am going to reduce Lactulose to BID. She is having numerous loose BMs a day.  # Electrolyte imbalances. K+ 3.1, Phosphorus < 1  --PCCM replacing and rechecking  # Constipation, significantly improved after enema and starting lactulose  # Anemia. hgb is stable at 8.7. Got partial unit of PRBCs on 2/11.   # NASH cirrhosis --Started lactulose on Friday --May have mild HE, otherwise no evidence for decompensation       SUBJECTIVE:   She has no complaints of abdominal pain right now. Per RN patient having mutiple loose BMs a day, has already had 3 this am. Patient pulled out her flexiseal .    Scheduled inpatient medications:  . bisacodyl  10 mg Rectal Daily  . Chlorhexidine Gluconate Cloth  6 each Topical Daily  . enoxaparin (LOVENOX) injection  40 mg Subcutaneous Daily  . feeding supplement (PROSource TF)  45 mL Per Tube TID  . free water  100 mL Per Tube Q4H  . Gerhardt's butt cream   Topical BID   . insulin aspart  0-20 Units Subcutaneous Q4H  . insulin detemir  10 Units Subcutaneous BID  . lactulose  20 g Per Tube TID  . mouth rinse  15 mL Mouth Rinse BID  . pantoprazole sodium  40 mg Per Tube QHS  . sennosides  5 mL Oral QHS  . sodium chloride flush  10-40 mL Intracatheter Q12H   Continuous inpatient infusions:  . sodium chloride 10 mL/hr at 07/08/20 1000  . dextrose 5% lactated ringers 50 mL/hr at 07/08/20 1111  . feeding supplement (OSMOLITE 1.5 CAL) Stopped (07/04/20 1100)  . norepinephrine (LEVOPHED) Adult infusion Stopped (07/07/20 1135)  . piperacillin-tazobactam (ZOSYN)  IV Stopped (07/08/20 0953)  . potassium PHOSPHATE IVPB (in mmol)     PRN inpatient medications: sodium chloride, acetaminophen **OR** acetaminophen, naLOXone (NARCAN)  injection, sodium chloride flush  Vital signs in last 24 hours: Temp:  [97.5 F (36.4 C)-98.9 F (37.2 C)] 97.8 F (36.6 C) (02/14 0800) Pulse Rate:  [79-96] 79 (02/14 1000) Resp:  [17-33] 21 (02/14 1000) BP: (110-151)/(46-100) 131/50 (02/14 1000) SpO2:  [97 %-100 %] 98 % (02/14 1000) Weight:  [74.5 kg] 74.5 kg (02/14 0500) Last BM Date: 07/08/20  Intake/Output Summary (Last 24 hours) at 07/08/2020 1121 Last data filed at 07/08/2020 1011 Gross per 24 hour  Intake 1075.75 ml  Output 2613 ml  Net -1537.25 ml     Physical Exam:  .  General: Alert for a few seconds when spoke to, otherwise sleeping . Heart:  Regular rate and rhythm. No lower extremity edema . Pulmonary: Normal respiratory effort . Abdomen: Soft, nondistended, nontender. Normal bowel sounds. Currently ! 50 cc of clear fluid with tiny pieces of debris in perc drain bag.  [name] Neurologic: sleepy, opens eyes briefly when spoke. Realizes she is at hospital. No obvious asterixis but hard to know for sure given her limited ability to participate in exam.   Filed Weights   07/06/20 0408 07/07/20 0500 07/08/20 0500  Weight: 80.2 kg 77.3 kg 74.5 kg    Intake/Output from  previous day: 02/13 0701 - 02/14 0700 In: 1236.4 [I.V.:185.8; IV Piggyback:1050.6] Out: 2613 [Urine:2525; Drains:88] Intake/Output this shift: Total I/O In: 97.8 [I.V.:60.8; IV Piggyback:37] Out: -     Lab Results: Recent Labs    07/06/20 0411 07/07/20 0740 07/08/20 0630  WBC 7.4 6.1 4.0  HGB 10.1* 9.0* 8.7*  HCT 33.6* 28.6* 27.6*  PLT 149* 133* 107*   BMET Recent Labs    07/07/20 1631 07/08/20 0528 07/08/20 0630  NA 141 QUESTIONABLE RESULTS, RECOMMEND RECOLLECT TO VERIFY 142  K 2.7* QUESTIONABLE RESULTS, RECOMMEND RECOLLECT TO VERIFY 3.1*  CL 115* QUESTIONABLE RESULTS, RECOMMEND RECOLLECT TO VERIFY 112*  CO2 20* QUESTIONABLE RESULTS, RECOMMEND RECOLLECT TO VERIFY 21*  GLUCOSE 127* QUESTIONABLE RESULTS, RECOMMEND RECOLLECT TO VERIFY 114*  BUN 15 QUESTIONABLE RESULTS, RECOMMEND RECOLLECT TO VERIFY 12  CREATININE 0.37* QUESTIONABLE RESULTS, RECOMMEND RECOLLECT TO VERIFY 0.35*  CALCIUM 6.9* QUESTIONABLE RESULTS, RECOMMEND RECOLLECT TO VERIFY 7.8*   LFT Recent Labs    07/07/20 0740  PROT 5.9*  ALBUMIN 2.9*  AST 50*  ALT 34  ALKPHOS 89  BILITOT 1.1   PT/INR No results for input(s): LABPROT, INR in the last 72 hours. Hepatitis Panel No results for input(s): HEPBSAG, HCVAB, HEPAIGM, HEPBIGM in the last 72 hours.  CT HEAD WO CONTRAST  Result Date: 07/07/2020 CLINICAL DATA:  Mental status change. EXAM: CT HEAD WITHOUT CONTRAST TECHNIQUE: Contiguous axial images were obtained from the base of the skull through the vertex without intravenous contrast. COMPARISON:  02/16/2020 FINDINGS: Brain: Stable mild age advanced cerebral atrophy. The ventricles are in the midline without mass effect or shift. No extra-axial fluid collections. No CT findings for acute hemispheric infarction or intracranial hemorrhage. No extra-axial fluid collections are identified. Brainstem and cerebellum are grossly normal. Vascular: No aneurysm or hyperdense vessels. Skull: No skull fracture or bone  lesions. Sinuses/Orbits: The paranasal sinuses and mastoid air cells are clear. The globes are intact. Other: No scalp lesions or scalp hematoma. IMPRESSION: 1. Stable mild age advanced cerebral atrophy. 2. No acute intracranial findings or mass lesions. Electronically Signed   By: Marijo Sanes M.D.   On: 07/07/2020 15:12   DG Chest Port 1 View  Result Date: 07/08/2020 CLINICAL DATA:  Aspiration. EXAM: PORTABLE CHEST 1 VIEW COMPARISON:  July 05, 2018. FINDINGS: Feeding tube courses below the diaphragm and outside the field of view. Right IJ central venous catheter with the tip projecting at the right atrium, similar to prior. Interval removal of the right PICC. Improved bilateral airspace opacities. No visible pleural effusions or pneumothorax. Similar cardiomediastinal silhouette. IMPRESSION: 1. Improved bilateral airspace opacities. 2. Support devices as above. Electronically Signed   By: Margaretha Sheffield MD   On: 07/08/2020 07:39      Active Problems:   Insulin dependent type 2 diabetes mellitus ([place])   Hyperlipidemia associated with type 2 diabetes mellitus ([place])  Dehydration   Hypertension associated with diabetes (H[facility])   GERD   Chronic constipation   Abdominal pain, right upper quadrant   Other cirrhosis of liver (HCC)   NASH (nonalcoholic steatohepatitis)   Diabetic peripheral neuropathy ([place])   Post-ERCP acute pancreatitis   Pancreatitis   Pressure injury of skin   Septic shock (HCC)   Acute respiratory failure with hypoxemia ([place])     LOS: 10 days   [name] ,NP 07/08/2020, 11:21 AM

## 2020-07-08 NOTE — Progress Notes (Signed)
EEG Completed; Results Pending  

## 2020-07-08 NOTE — Progress Notes (Addendum)
NAME:  Kim Hicks, MRN:  [id number], DOB:  Jul 02, 1965, LOS: 35 ADMISSION DATE:  06/27/2020, CONSULTATION DATE: 07/04/2020 REFERRING MD: Dr. Pricilla Handler, CHIEF COMPLAINT: Hypertension /altered mental status  Brief History:  55 year old female with NASH & known persistent bile leak after undergoing laparoscopic cholecystectomy 08/04/2019   Admitted with post ERCP pancreatitis 2/2 she has been followed by infectious disease and GI.  PCCM consult 2/10 due to hypotension and somnolence >> some improvement with Narcan   Past Medical History:  Cirrhosis from NASH, cholecystitis complicated by persistent bile leak and infected biloma, type 2 diabetes hyperlipidemia, hypertension, migraines, neuropathy, GERD, and IBS   Significant Hospital Events:  Admitted 2/3 > for abdominal and back pain 2/10 pccm consulted for new septic shock. Felt 2/2 aspiration event.  2/10-->2/14 treated w/ zosyn. Required fluid resuscitation and pressors. As of 2/14 off pressors for 24 hrs. Residual issue at this point remains fairly sig encephalopathy. This is slowly improving Consults:  GI  Medicine GI surgery   Procedures:  ERCP prior to admit 2/2 > 1 partially occluded migrated stent with moderate main bile duct dilation and 4 plastic biliary stents were placed within the common bile duct RIJ CVL 2/11 >>  Significant Diagnostic Tests:  CT abdomen and pelvis 2/3 >  Percutaneous drainage catheter coiled in the gallbladder fossa without focal fluid collection. New 4 biliary stents extending from the porta hepatis to the duodenum.  Large colonic stool burden   CT abdomen/pelvis 2/10 new bi basal airspace disease , no residual fluid collection in gallbladder fossa, catheter in position, downstream migration of biliary stents, thickening of distal transverse colon  Micro Data:  COVID 2/3 > negative Blood cultures 2/10  > neg MRSA PCR 2/10 > negative  Antimicrobials:  Ceftriaxone 2/2 > 2/10 Vancomycin 2/10 only  Zosyn  2/10 >>>   Interim History / Subjective:  Getting better  Objective   Blood pressure (Abnormal) 131/50, pulse 79, temperature 98.7 F (37.1 C), temperature source Axillary, resp. rate (Abnormal) 21, height 5' 4"  (1.626 m), weight 74.5 kg, SpO2 98 %.        Intake/Output Summary (Last 24 hours) at 07/08/2020 1005 Last data filed at 07/08/2020 1000 Gross per 24 hour  Intake 1138.41 ml  Output 2613 ml  Net -1474.59 ml   Filed Weights   07/06/20 0408 07/07/20 0500 07/08/20 0500  Weight: 80.2 kg 77.3 kg 74.5 kg    Examination: General this is a 55 year old female. She is lying in bed. Not in distress HENT NCAT no JVD MMM Feeding tube in nare. Pupils not icteric  Pulm clear. On room air  Card rrr abd soft RUQ biliary drain has clear liquid + bowel sounds Neuro awake. Sp soft.disoriented. will f/c at times. No focal motor def noted GU cl yellow Ext warm and dry   Resolved Hospital Problem list   Post ERCP pancreatitis AKI resolved  NAG metabolic acidosis Septic shock  -Developed 2/10 , worsening 2/11 Probably due to HAP , no abdominal source identified on repeat CT --off pressors for > 24 hrs  Assessment & Plan:   persistent bile leak with multiple biliary stents/exchanges -Patient has had longstanding history of above since original cholecystectomy 07/2019 plan: Has pigtail placed by IR in GB fossa. They will cont to manage w/ GI and surg.  Plan is for drain change today  NPO for procedure   Sepsis 2/2 HCAP (NOS) pcxr infiltrates essentially resolved.  Plan Day 5 zosyn; if abx not needed  from GI standpoint dc at day 7 Change IVF to 50cc/hr down from 100  Recent post ERCP pancreatitis  Plan Re-trial tubefeeds after tube exchange today 2/14  Hx of liver cirrhosis secondary to NASH Plan Cont lactulose   Fluid and electrolyte imbalance: hypokalemia, severe hypophosphatemia Plan Replace and recheck  Acute metabolic encephalopathy -Likely multifactorial  including likely opioid overdose coupled with acute illness. -being seen by neurology acutely on 2/13 for new concern for stroke. Had multiple sedating meds ([name], Seroquel, Trintelix, rexulti, topamax and lyrica, all d/c'd slowly and off as of 2/14) Plan For MRI today per neuro to r/o stroke Limit sedation Cont lactulose     Insulin-dependent type 2 diabetes -Home medications include Basaglar, Trulicity,  Metformin and Synjardy -has excellent control plan Cont levemir 10 units bid Cont ssi   Anemia of chronic disease  Continue to monitor, no obvious bleeding. Tol LMWH. hgb holding Plan Trend cbc Transfuse for hgb < 7    H/o GERD  Plan Cont PPI  Best practice (evaluated daily)  Diet: Tube feeds on hold-->resume 2/14 Pain/Anxiety/Delirium protocol (if indicated): dc VAP protocol (if indicated): N/A DVT prophylaxis: Lovenox GI prophylaxis: PPI Glucose control: SSI Mobility: Bedrest  Disposition:ICU  Goals of Care:  Last date of multidisciplinary goals of care discussion:Pending  Family and staff present: Pending  Summary of discussion: Pending Follow up goals of care discussion due: Pending  Code Status: Full Updated husband 2/14 at bedside   Back to [facility] for 2/15  Erick Colace ACNP-BC [employer] Pager # [phone] OR # [phone] if no answer

## 2020-07-08 NOTE — Progress Notes (Signed)
[id number] pain, bile leak  Subjective: Confused in mittens and abdominal restraint with what some think is an expressive aphasia last PM.  She has had encephalopathy and is on lactulose qid for this, which explains diarrhea.  She is looks and answers OK this AM, but nothing more complex than OK this AM while I was in there.  Still having loose stools.  Seen by Neurology early AM, and they think it is encephalopathy, negative Acute findings on head CT, MRI pending  Abdomen is soft, does not appear tender or distended drain is clear serous appearing fluid. Off pressors. IR drain change planned for today, but with MRI pending that may be delayed.  Objective: Vital signs in last 24 hours: Temp:  [97.5 F (36.4 C)-98.9 F (37.2 C)] 98.7 F (37.1 C) (02/14 0400) Pulse Rate:  [86-98] 86 (02/14 0720) Resp:  [17-33] 33 (02/14 0720) BP: (110-151)/(46-100) 127/61 (02/14 0600) SpO2:  [97 %-100 %] 97 % (02/14 0720) Weight:  [74.5 kg] 74.5 kg (02/14 0500) Last BM Date: 07/07/20 1236 IV No tube feeding recorded Urine 2525 Drain 88 Stool x10 K+ 3.1 WBC 4.0 H/H 8.7/27.6 Intake/Output from previous day: 02/13 0701 - 02/14 0700 In: 1236.4 [I.V.:185.8; IV Piggyback:1050.6] Out: 2613 [Urine:2525; Drains:88] Intake/Output this shift: Total I/O In: 9.3 [I.V.:4.2; IV Piggyback:5.1] Out: -   General appearance: alert and confused, I only got a couple one word answers to questions this AM.  She is in mitten and abdominal restraints; incontinent of stool, foley in place. Resp: clear anterior GI: soft, does not appear tender, no distension, drain fluid is clear.  Lab Results:  Recent Labs    07/07/20 0740 07/08/20 0630  WBC 6.1 4.0  HGB 9.0* 8.7*  HCT 28.6* 27.6*  PLT 133* 107*    BMET Recent Labs    07/08/20 0528 07/08/20 0630  NA QUESTIONABLE RESULTS, RECOMMEND RECOLLECT TO VERIFY 142  K QUESTIONABLE RESULTS, RECOMMEND RECOLLECT TO VERIFY 3.1*  CL QUESTIONABLE RESULTS,  RECOMMEND RECOLLECT TO VERIFY 112*  CO2 QUESTIONABLE RESULTS, RECOMMEND RECOLLECT TO VERIFY 21*  GLUCOSE QUESTIONABLE RESULTS, RECOMMEND RECOLLECT TO VERIFY 114*  BUN QUESTIONABLE RESULTS, RECOMMEND RECOLLECT TO VERIFY 12  CREATININE QUESTIONABLE RESULTS, RECOMMEND RECOLLECT TO VERIFY 0.35*  CALCIUM QUESTIONABLE RESULTS, RECOMMEND RECOLLECT TO VERIFY 7.8*   PT/INR No results for input(s): LABPROT, INR in the last 72 hours.  Recent Labs  Lab 07/03/20 0501 07/04/20 0508 07/05/20 0542 07/06/20 0411 07/07/20 0740  AST 67* 61* 75* 59* 50*  ALT 40 38 47* 41 34  ALKPHOS 129* 128* 95 79 89  BILITOT 0.4 0.4 0.7 0.6 1.1  PROT 6.5 6.8 5.7* 5.7* 5.9*  ALBUMIN 3.5 3.6 2.8* 3.0* 2.9*     Lipase     Component Value Date/Time   LIPASE 34 07/03/2020 0507     Medications: . bisacodyl  10 mg Rectal Daily  . Chlorhexidine Gluconate Cloth  6 each Topical Daily  . enoxaparin (LOVENOX) injection  40 mg Subcutaneous Daily  . feeding supplement (PROSource TF)  45 mL Per Tube TID  . free water  100 mL Per Tube Q4H  . Gerhardt's butt cream   Topical BID  . insulin aspart  0-20 Units Subcutaneous Q4H  . insulin detemir  10 Units Subcutaneous BID  . lactulose  20 g Per Tube TID  . mouth rinse  15 mL Mouth Rinse BID  . pantoprazole sodium  40 mg Per Tube QHS  . sennosides  5 mL Oral QHS  .  sodium chloride flush  10-40 mL Intracatheter Q12H   . sodium chloride 10 mL/hr at 07/08/20 0720  . sodium chloride Stopped (07/05/20 0104)  . dextrose 5% lactated ringers 100 mL/hr at 07/07/20 1025  . feeding supplement (OSMOLITE 1.5 CAL) Stopped (07/04/20 1100)  . norepinephrine (LEVOPHED) Adult infusion Stopped (07/07/20 1135)  . piperacillin-tazobactam (ZOSYN)  IV 12.5 mL/hr at 07/08/20 0720    Assessment/Plan Altered mental status/concern for aphasia/stroke  - stable head CT 07/07/20 >> MRI pending/neurology following Acute metabolic encephalopathy Post ERCP pancreatitis NASH/Cirrhosis Type 2  diabetes Hypertension Migraines Neuropathy GERD IBS Hypokalemia Anemia  Sepsis 07/04/2020 Persistent bile leak s/p fenestrated laparoscopic cholecystectomy 08/04/2019 Dr. Jens Som ERCP/stent placement: 08/07/2019, 11/07/2019, 05/13/2020, 07/14/2020 IR drain placement 05/23/2020; DG sinus tract/fistula tube check leak between decompressed gallbladder fossa abscess cavity/cystic duct remnant  FEN:IV fluids/TF on hold ID: Zosyn 2/10>> day 5 DVT:  Lovenox Follow up:  Dr. Windle Guard  Plan:  Pt is off pressors, confusion/aphasia is being further evaluated.  IR planning drain change.  Drainage is currently clear.    LOS: 10 days    Kim Hicks 07/08/2020 Please see Amion

## 2020-07-08 NOTE — Procedures (Signed)
Patient Name: Kim Hicks  MRN: [id number]  Epilepsy Attending: Lora Havens  Referring Physician/Provider: Dr. Amie Portland Date: 07/08/2020 Duration: 23.55 mins  Patient history: 82 old female with altered mental status, noted to have speech disturbance.  EEG to evaluate for seizures.  Level of alertness: Awake, asleep  AEDs during EEG study: None  Technical aspects: This EEG study was done with scalp electrodes positioned according to the 10-20 International system of electrode placement. Electrical activity was acquired at a sampling rate of 500Hz  and reviewed with a high frequency filter of 70Hz  and a low frequency filter of 1Hz . EEG data were recorded continuously and digitally stored.   Description: The posterior dominant rhythm consists of 8 Hz activity of moderate voltage (25-35 uV) seen predominantly in posterior head regions, symmetric and reactive to eye opening and eye closing. Sleep was characterized by sleep spindles (12- 14 Hz), maximal frontocentral region. EEG showed continuous generalized 3 to 6 Hz theta-delta slowing. Generalized periodic discharges with triphasic morphology were noted at 1-1.5hz  which were predominantly seen when patient was awake/stimulated. Hyperventilation and photic stimulation were not performed.     ABNORMALITY -Continuous slow, generalized -Periodic discharges with triphasic morphology, generalized  IMPRESSION: This study is suggestive of mild diffuse encephalopathy, nonspecific to etiology but could be secondary to toxic-metabolic causes. No seizures or definite epileptiform discharges were seen throughout the recording.  [place] Barbra Sarks

## 2020-07-08 NOTE — Consult Note (Addendum)
Neurology Consultation  Reason for Consult: Altered mental status, concern for aphasia and stroke Referring Physician: Dr. Christinia Gully  CC: Altered mental status, concern for aphasia and stroke  History is obtained from: Chart review, patient's bedside RN  HPI: Kim Hicks is a 55 y.o. female past medical history of cirrhosis of the liver, diabetes, bilateral neuropathy, hypertension, hyperlipidemia, anxiety, presenting to the hospital for evaluation of abdominal pain and low back pain on 06/27/2020 and has since been admitted to the hospital. She was admitted for a known persistent bile leak after undergoing a laparoscopic cholecystectomy on 08/04/2019, admitted on due to post ERCP pancreatitis and has been followed by infectious disease and GI.  On 07/04/2020, she had hypotension and increased somnolence with some improvement with Narcan, and was transferred to the ICU to the critical care service. Neurology was consulted today because of concern for receptive aphasia-inability to follow commands which was noted at shift change 7 AM on 07/07/2020. Chart review reveals that the impression from the critical care team is that the patient is somewhat better in terms of her mentation but the RN tells me that she was given a handoff that the patient was noted to be receptively aphasic, for which a head CT was performed which was unremarkable and neurological consultation was obtained. Patient is unable to provide any clear history at this time.  LKW: Symptoms noted at 7 AM on 07/07/2020.  Last known well unknown. tpa given?: no, outside the window Premorbid modified Rankin scale (mRS): Unable to ascertain from the patient  ROS: Unable to obtain due to altered mental status  Past Medical History:  Diagnosis Date  . Anxiety   . Cataract   . Cirrhosis of liver ([place])   . Depression   . Diabetes mellitus without complication ([place])   . Dysrhythmia   . Edema   . Family history of adverse reaction to  anesthesia    mother had trouble waking up after surgery  . Foot fracture, left   . Headache    MIGRAINES  . Hyperlipidemia   . Hypertension   . Neuromuscular disorder ([place])    NEUROPATHY-both hands and feet  . Palpitations   . Wheezing     Family History  Problem Relation Age of Onset  . Diabetes Mother   . Heart failure Mother   . Depression Mother   . Colon cancer Neg Hx   . Stomach cancer Neg Hx   . Esophageal cancer Neg Hx   . Pancreatic cancer Neg Hx      Social History:   reports that she has never smoked. She has never used smokeless tobacco. She reports that she does not drink alcohol and does not use drugs.  Medications  Current Facility-Administered Medications:  .  0.9 %  sodium chloride infusion, , Intravenous, PRN, Bonnielee Haff, MD, Stopped at 07/07/20 2345 .  0.9 %  sodium chloride infusion, 250 mL, Intravenous, Continuous, Mannam, Praveen, MD, Stopped at 07/05/20 0104 .  acetaminophen (TYLENOL) tablet 650 mg, 650 mg, Oral, Q6H PRN, 650 mg at 07/06/20 0037 **OR** acetaminophen (TYLENOL) suppository 650 mg, 650 mg, Rectal, Q6H PRN, Doutova, Anastassia, MD, 650 mg at 07/05/20 0156 .  bisacodyl (DULCOLAX) suppository 10 mg, 10 mg, Rectal, Daily, Saverio Danker, PA-C .  Chlorhexidine Gluconate Cloth 2 % PADS 6 each, 6 each, Topical, Daily, Bonnielee Haff, MD, 6 each at 07/07/20 0100 .  dextrose 5 % in lactated ringers infusion, , Intravenous, Continuous, Tanda Rockers, MD, Last  Rate: 100 mL/hr at 07/07/20 1025, New Bag at 07/07/20 1025 .  enoxaparin (LOVENOX) injection 40 mg, 40 mg, Subcutaneous, Daily, Bonnielee Haff, MD, 40 mg at 07/07/20 0820 .  feeding supplement (OSMOLITE 1.5 CAL) liquid 1,000 mL, 1,000 mL, Per Tube, Continuous, Bonnielee Haff, MD, Stopped at 07/04/20 1100 .  feeding supplement (PROSource TF) liquid 45 mL, 45 mL, Per Tube, TID, Bonnielee Haff, MD, 45 mL at 07/07/20 2140 .  free water 100 mL, 100 mL, Per Tube, Q4H, Bonnielee Haff, MD,  100 mL at 07/07/20 2140 .  Gerhardt's butt cream, , Topical, BID, Rigoberto Noel, MD, Given at 07/07/20 2250 .  insulin aspart (novoLOG) injection 0-20 Units, 0-20 Units, Subcutaneous, Q4H, Rigoberto Noel, MD, 3 Units at 07/07/20 1144 .  insulin detemir (LEVEMIR) injection 10 Units, 10 Units, Subcutaneous, BID, Rigoberto Noel, MD, 10 Units at 07/07/20 2251 .  lactulose (CHRONULAC) 10 GM/15ML solution 20 g, 20 g, Per Tube, TID, Willia Craze, NP, 20 g at 07/07/20 2243 .  MEDLINE mouth rinse, 15 mL, Mouth Rinse, BID, Bonnielee Haff, MD, 15 mL at 07/07/20 2251 .  naloxone [facility]) injection 0.4 mg, 0.4 mg, Intravenous, PRN, Bonnielee Haff, MD, 0.4 mg at 07/04/20 1231 .  norepinephrine (LEVOPHED) 16 mg in 242m premix infusion, 0-40 mcg/min, Intravenous, Titrated, ARigoberto Noel MD, Stopped at 07/07/20 1135 .  pantoprazole sodium (PROTONIX) 40 mg/20 mL oral suspension 40 mg, 40 mg, Per Tube, QHS, KBonnielee Haff MD, 40 mg at 07/07/20 2243 .  piperacillin-tazobactam (ZOSYN) IVPB 3.375 g, 3.375 g, Intravenous, Q8H, Shade, Christine E, RPH, Last Rate: 12.5 mL/hr at 07/07/20 2346, Infusion Verify at 07/07/20 2346 .  potassium chloride 10 mEq in 50 mL *CENTRAL LINE* IVPB, 10 mEq, Intravenous, Q1H, MElmer Sow MD, Stopped at 07/07/20 2347 .  sennosides (SENOKOT) 8.8 MG/5ML syrup 5 mL, 5 mL, Oral, QHS, KBonnielee Haff MD, 5 mL at 07/07/20 2244 .  sodium chloride flush (NS) 0.9 % injection 10-40 mL, 10-40 mL, Intracatheter, Q12H, KBonnielee Haff MD, 10 mL at 07/07/20 2251 .  sodium chloride flush (NS) 0.9 % injection 10-40 mL, 10-40 mL, Intracatheter, PRN, KBonnielee Haff MD  Exam: Current vital signs: BP (!) 122/54   Pulse 96   Temp 98.8 F (37.1 C) (Oral)   Resp (!) 24   Ht 5' 4"  (1.626 m)   Wt 77.3 kg   SpO2 97%   BMI 29.25 kg/m  Vital signs in last 24 hours: Temp:  [97.5 F (36.4 C)-99.6 F (37.6 C)] 98.8 F (37.1 C) (02/13 2000) Pulse Rate:  [87-101] 96 (02/13 2300) Resp:   [16-31] 24 (02/13 2300) BP: (107-135)/(46-115) 122/54 (02/13 2300) SpO2:  [95 %-100 %] 97 % (02/13 2300) Weight:  [77.3 kg] 77.3 kg (02/13 0500) General: Awake, alert, in no apparent distress HEENT: Normocephalic, atraumatic, dry oral mucous membranes, no neck stiffness CVS: Regular rate rhythm Respiratory: Breathing well saturating normally on room air Extremities: Warm well perfused Abdomen: Nontender, recently had a bowel movement and had taken her Flexi-Seal off Neurological exam Awake, alert Was able to tell me her name Unable to tell me her age Very slow to respond to questions Was able to repeat a sentence after coaching Unable to name objects consistently Speech was mildly dysarthric Cranial nerves: Pupils equal round react light, extraocular movements intact, visual fields full to threat, facial symmetry preserved. Motor exam: Antigravity in all fours with asterixis on outstretched arms bilaterally Normal tone.  Question some subtle twitching noted  in all major muscle groups in the upper and lower extremity on rest as well-likely asterixis. Sensory exam: Intact to touch all over Coordination: Does not follow commands consistently to check finger-nose-finger or heel-knee-shin testing NIH stroke scale 1a Level of Conscious.: 0 1b LOC Questions: 2 1c LOC Commands: 0 2 Best Gaze: 0 3 Visual: 0 4 Facial Palsy: 0 5a Motor Arm - left: 0 5b Motor Arm - Right: 0 6a Motor Leg - Left: 0 6b Motor Leg - Right: 0 7 Limb Ataxia: 0 8 Sensory: 0 9 Best Language: 1 10 Dysarthria: 1 11 Extinct. and Inatten.: 0 TOTAL: 4    Labs I have reviewed labs in epic and the results pertinent to this consultation are:  CBC    Component Value Date/Time   WBC 6.1 07/07/2020 0740   RBC 3.70 (L) 07/07/2020 0740   HGB 9.0 (L) 07/07/2020 0740   HGB 14.7 12/14/2012 1255   HCT 28.6 (L) 07/07/2020 0740   PLT 133 (L) 07/07/2020 0740   MCV 77.3 (L) 07/07/2020 0740   MCH 24.3 (L) 07/07/2020  0740   MCHC 31.5 07/07/2020 0740   RDW 16.7 (H) 07/07/2020 0740   LYMPHSABS 1.7 06/28/2020 0446   MONOABS 0.4 06/28/2020 0446   EOSABS 0.4 06/28/2020 0446   BASOSABS 0.0 06/28/2020 0446    CMP     Component Value Date/Time   NA 141 07/07/2020 1631   NA 137 12/14/2012 1255   K 2.7 (LL) 07/07/2020 1631   K 4.0 12/14/2012 1255   CL 115 (H) 07/07/2020 1631   CL 104 12/14/2012 1255   CO2 20 (L) 07/07/2020 1631   CO2 27 12/14/2012 1255   GLUCOSE 127 (H) 07/07/2020 1631   GLUCOSE 233 (H) 12/14/2012 1255   BUN 15 07/07/2020 1631   BUN 17 12/14/2012 1255   CREATININE 0.37 (L) 07/07/2020 1631   CREATININE 0.46 (L) 12/14/2012 1255   CALCIUM 6.9 (L) 07/07/2020 1631   CALCIUM 9.3 12/14/2012 1255   PROT 5.9 (L) 07/07/2020 0740   PROT 7.0 05/16/2019 1154   ALBUMIN 2.9 (L) 07/07/2020 0740   AST 50 (H) 07/07/2020 0740   ALT 34 07/07/2020 0740   ALKPHOS 89 07/07/2020 0740   BILITOT 1.1 07/07/2020 0740   GFRNONAA >60 07/07/2020 1631   GFRNONAA >60 12/14/2012 1255   GFRAA >60 02/16/2020 0520   GFRAA >60 12/14/2012 1255   Ordered ammonia level Last level was 62 on 07/06/2020. Today's ammonia level is 46  Imaging I have reviewed the images obtained:  CT-scan of the brain-stable mild cerebral atrophy which is advanced for age.  No acute abnormality.  No bleed.  Assessment:  55 year old with past medical history of cirrhosis, diabetes, bilateral neuropathies, hypertension, hyperlipidemia, anxiety, known persistent bile leak, post ERCP pancreatitis admitted to ICU for hypotension and altered mental status/depressed level of consciousness likely secondary to narcotics/medications.  Continue to have multiple toxic metabolic derangements. Neurology consultation for concern for receptive aphasia noted at shift change this morning.  Unclear last known well. She did have difficulty in following commands-did not follow them consistently but I feel that her overall picture is more consistent with  toxic metabolic encephalopathy. Noncontrast head CT unremarkable for acute process. Although improving, continues to have hyperammonemia. Clinical picture usually lags lab improvement in toxic metabolic derangements Given risk factors, evaluation for stroke is worthwhile Unclear last known well-outside the window for TPA Exam not consistent with LVO  Impression: Toxic metabolic encephalopathy Evaluate for stroke   Recommendations: MRI  brain without contrast in the morning Routine EEG, B12, TSH Minimize sedating medications as you are If the MRI shows a stroke, a full stroke work-up at that time. Management of toxic metabolic derangements per primary team as you are Neurology will follow on the above studies  We will relay plan to the PCCM attending on call. -- Amie Portland, MD Neurologist [facility] Pager: [phone]

## 2020-07-09 DIAGNOSIS — K859 Acute pancreatitis without necrosis or infection, unspecified: Secondary | ICD-10-CM | POA: Diagnosis not present

## 2020-07-09 DIAGNOSIS — K839 Disease of biliary tract, unspecified: Secondary | ICD-10-CM | POA: Diagnosis not present

## 2020-07-09 DIAGNOSIS — R4182 Altered mental status, unspecified: Secondary | ICD-10-CM | POA: Diagnosis not present

## 2020-07-09 DIAGNOSIS — G9341 Metabolic encephalopathy: Secondary | ICD-10-CM | POA: Diagnosis not present

## 2020-07-09 DIAGNOSIS — K9189 Other postprocedural complications and disorders of digestive system: Secondary | ICD-10-CM | POA: Diagnosis not present

## 2020-07-09 LAB — COMPREHENSIVE METABOLIC PANEL
ALT: 27 U/L (ref 0–44)
AST: 35 U/L (ref 15–41)
Albumin: 2.9 g/dL — ABNORMAL LOW (ref 3.5–5.0)
Alkaline Phosphatase: 122 U/L (ref 38–126)
Anion gap: 8 (ref 5–15)
BUN: 8 mg/dL (ref 6–20)
CO2: 20 mmol/L — ABNORMAL LOW (ref 22–32)
Calcium: 7.8 mg/dL — ABNORMAL LOW (ref 8.9–10.3)
Chloride: 112 mmol/L — ABNORMAL HIGH (ref 98–111)
Creatinine, Ser: 0.32 mg/dL — ABNORMAL LOW (ref 0.44–1.00)
GFR, Estimated: >60 mL/min (ref >60)
Glucose, Bld: 227 mg/dL — ABNORMAL HIGH (ref 70–99)
Potassium: 3.2 mmol/L — ABNORMAL LOW (ref 3.5–5.1)
Sodium: 140 mmol/L (ref 135–145)
Total Bilirubin: 0.8 mg/dL (ref 0.3–1.2)
Total Protein: 5.8 g/dL — ABNORMAL LOW (ref 6.5–8.1)

## 2020-07-09 LAB — CBC
HCT: 29.3 % — ABNORMAL LOW (ref 36.0–46.0)
Hemoglobin: 8.8 g/dL — ABNORMAL LOW (ref 12.0–15.0)
MCH: 24.3 pg — ABNORMAL LOW (ref 26.0–34.0)
MCHC: 30 g/dL (ref 30.0–36.0)
MCV: 80.9 fL (ref 80.0–100.0)
Platelets: 107 10*3/uL — ABNORMAL LOW (ref 150–400)
RBC: 3.62 MIL/uL — ABNORMAL LOW (ref 3.87–5.11)
RDW: 17 % — ABNORMAL HIGH (ref 11.5–15.5)
WBC: 3.9 10*3/uL — ABNORMAL LOW (ref 4.0–10.5)
nRBC: 0 % (ref 0.0–0.2)

## 2020-07-09 LAB — GLUCOSE, CAPILLARY
Glucose-Capillary: 153 mg/dL — ABNORMAL HIGH (ref 70–99)
Glucose-Capillary: 172 mg/dL — ABNORMAL HIGH (ref 70–99)
Glucose-Capillary: 155 mg/dL — ABNORMAL HIGH (ref 70–99)
Glucose-Capillary: 201 mg/dL — ABNORMAL HIGH (ref 70–99)
Glucose-Capillary: 205 mg/dL — ABNORMAL HIGH (ref 70–99)
Glucose-Capillary: 206 mg/dL — ABNORMAL HIGH (ref 70–99)
Glucose-Capillary: 223 mg/dL — ABNORMAL HIGH (ref 70–99)
Glucose-Capillary: 247 mg/dL — ABNORMAL HIGH (ref 70–99)

## 2020-07-09 LAB — MAGNESIUM: Magnesium: 1.7 mg/dL (ref 1.7–2.4)

## 2020-07-09 MED ORDER — QUETIAPINE FUMARATE 100 MG PO TABS: 200.0000 mg | ORAL_TABLET | Freq: Every day | ORAL | Status: DC

## 2020-07-09 MED ORDER — SENNOSIDES 8.8 MG/5ML PO SYRP
5.0000 mL | ORAL_SOLUTION | Freq: Every day | ORAL | Status: DC
  Filled 2020-07-09: qty 5

## 2020-07-09 MED ORDER — ONDANSETRON HCL 4 MG/2ML IJ SOLN
4.0000 mg | Freq: Once | INTRAMUSCULAR | Status: AC
  Administered 2020-07-09: 4 mg via INTRAVENOUS
  Filled 2020-07-09: qty 2

## 2020-07-09 MED ORDER — POTASSIUM CHLORIDE 10 MEQ/100ML IV SOLN: 10.0000 meq | INTRAVENOUS | Status: DC

## 2020-07-09 MED ORDER — ACETAMINOPHEN 160 MG/5ML PO SOLN
650.0000 mg | Freq: Four times a day (QID) | ORAL | Status: DC | PRN
  Administered 2020-07-09 – 2020-07-10 (×2): 650 mg
  Filled 2020-07-09 (×2): qty 20.3

## 2020-07-09 MED ORDER — JEVITY 1.5 CAL/FIBER PO LIQD
1000.0000 mL | ORAL | Status: DC
  Administered 2020-07-09: 1000 mL
  Filled 2020-07-09 (×6): qty 1000

## 2020-07-09 MED ORDER — PREGABALIN 50 MG PO CAPS
50.0000 mg | ORAL_CAPSULE | Freq: Three times a day (TID) | ORAL | Status: DC
  Administered 2020-07-09: 50 mg via ORAL
  Filled 2020-07-09: qty 1

## 2020-07-09 MED ORDER — MELATONIN 3 MG PO TABS
3.0000 mg | ORAL_TABLET | Freq: Every evening | ORAL | Status: DC | PRN
  Administered 2020-07-09: 3 mg
  Filled 2020-07-09: qty 1

## 2020-07-09 MED ORDER — PREGABALIN 50 MG PO CAPS
50.0000 mg | ORAL_CAPSULE | Freq: Three times a day (TID) | ORAL | Status: DC
  Administered 2020-07-09 – 2020-07-10 (×2): 50 mg
  Filled 2020-07-09 (×3): qty 1

## 2020-07-09 MED ORDER — POTASSIUM CHLORIDE 10 MEQ/50ML IV SOLN
10.0000 meq | INTRAVENOUS | Status: AC
  Administered 2020-07-09 (×4): 10 meq via INTRAVENOUS
  Filled 2020-07-09 (×4): qty 50

## 2020-07-09 MED ORDER — QUETIAPINE FUMARATE 100 MG PO TABS
200.0000 mg | ORAL_TABLET | Freq: Every day | ORAL | Status: DC
  Administered 2020-07-09: 200 mg
  Filled 2020-07-09: qty 2

## 2020-07-09 MED ORDER — FREE WATER
200.0000 mL | Status: DC
  Administered 2020-07-09 – 2020-07-10 (×6): 200 mL

## 2020-07-09 MED ORDER — ACETAMINOPHEN 650 MG RE SUPP: 650.0000 mg | Freq: Four times a day (QID) | RECTAL | Status: DC | PRN

## 2020-07-09 NOTE — Progress Notes (Signed)
Progress Note  Chief Complaint:  Pancreatitis.      ASSESSMENT / PLAN:    # Post-ERCP pancreatitis. She is s/p multiple biliary stents / exchanges for persistent bile duck leak post cholecystectomy in March 2021. Four stents placed on 06/26/20. Only a small amount of output in perc drain ( 80 ml yesterday).  No pancreatic findings on last non-contrast CT scan 07/13/20. CCS following.  --IR hasn't yet exchanged drain.   --Her small bowel feeding tube feeds have been resumed. Currently at 35 ml / hr but having nausea since feeding were started. Will increase anti-emetics to scheduled dosing if not already done. Hopefull she will be able to tolerate enteral feeding.   # Sepsis related to aspiration ( during period of encephalopathy) vrs hospital acquired PNA. CXR yesterday shows improvement in airspace disease.  --On Zosyn for PNA.  It isn't needed from a GI standpoint.  # Improved encephalopathy which was probably multifactorial ( illness, medications, possibly HE).  Brain MRI negative for acute stroke. EEG yesterday suggestive mild, diffuse encephalopathy.  --Kim Hicks is much more lucid and oriented today. Talking to husband. Carries on a conversation with me. She want to get out of bed. Admitting team is slowly resuming home lyrica and seroquel. There doesn't appear to be a need to reduce dosages of these meds based on liver disease.  --Continue BID lactulose   # Constipation, significantly improved after enema and starting lactulose  # Anemia. hgb stable at 8.8. Got partial unit of PRBCs on 2/11.   # NASH cirrhosis --Continue BId lactulose, has flexiseal --May have mild HE, otherwise no evidence for decompensation       SUBJECTIVE:   Has been nauseated since yesterday but no vomiting.     OBJECTIVE:      Scheduled inpatient medications:  . bisacodyl  10 mg Rectal Daily  . Chlorhexidine Gluconate Cloth  6 each Topical Daily  . enoxaparin (LOVENOX) injection  40 mg  Subcutaneous Daily  . feeding supplement (PROSource TF)  45 mL Per Tube TID  . free water  200 mL Per Tube Q4H  . insulin aspart  0-20 Units Subcutaneous Q4H  . insulin detemir  10 Units Subcutaneous BID  . lactulose  20 g Per Tube BID  . liver oil-zinc oxide   Topical TID  . mouth rinse  15 mL Mouth Rinse BID  . pantoprazole sodium  40 mg Per Tube QHS  . pregabalin  50 mg Oral TID  . QUEtiapine  200 mg Oral QHS  . sennosides  5 mL Oral QHS  . sodium chloride flush  10-40 mL Intracatheter Q12H   Continuous inpatient infusions:  . sodium chloride Stopped (07/08/20 1031)  . dextrose 5% lactated ringers 50 mL/hr at 07/09/20 0737  . feeding supplement (JEVITY 1.5 CAL/FIBER)    . feeding supplement (OSMOLITE 1.5 CAL) 35 mL/hr at 07/09/20 0400  . piperacillin-tazobactam (ZOSYN)  IV Stopped (07/09/20 0853)  . potassium chloride     PRN inpatient medications: sodium chloride, acetaminophen **OR** acetaminophen, melatonin, naLOXone (NARCAN)  injection, ondansetron (ZOFRAN) IV, sodium chloride flush  Vital signs in last 24 hours: Temp:  [98.4 F (36.9 C)-98.7 F (37.1 C)] 98.4 F (36.9 C) (02/15 0800) Pulse Rate:  [86-91] 89 (02/15 0800) Resp:  [18-26] 23 (02/15 0800) BP: (124-143)/(50-84) 139/50 (02/15 0800) SpO2:  [91 %-100 %] 99 % (02/15 0800) Last BM Date: 07/09/20  Intake/Output Summary (Last 24 hours) at 07/09/2020 1200 Last data filed at 07/09/2020  0921 Gross per 24 hour  Intake 6955.17 ml  Output 86 ml  Net 6869.17 ml     Physical Exam:  . General: Alert female in NAD. SB feedings in progress . Heart:  Regular rate and rhythm. No lower extremity edema . Pulmonary: Normal respiratory effort . Abdomen: Soft, nondistended, nontender. Normal bowel sounds. Scan amount of light yellow drainage in biliary bag . Neurologic: Alert and oriented. No asterixis . Psych: Pleasant. Cooperative.   Filed Weights   07/06/20 0408 07/07/20 0500 07/08/20 0500  Weight: 80.2 kg 77.3 kg  74.5 kg    Intake/Output from previous day: 02/14 0701 - 02/15 0700 In: 6829.4 [I.V.:4217.6; NG/GT:1915.4; IV Piggyback:696.3] Out: 86 [Urine:1; Drains:80; Stool:5] Intake/Output this shift: Total I/O In: 223.6 [I.V.:130; NG/GT:70; IV Piggyback:23.6] Out: -     Lab Results: Recent Labs    07/07/20 0740 07/08/20 0630 07/09/20 0455  WBC 6.1 4.0 3.9*  HGB 9.0* 8.7* 8.8*  HCT 28.6* 27.6* 29.3*  PLT 133* 107* 107*   BMET Recent Labs    07/08/20 0630 07/08/20 1700 07/09/20 0930  NA 142 140 140  K 3.1* 3.2* 3.2*  CL 112* 112* 112*  CO2 21* 20* 20*  GLUCOSE 114* 142* 227*  BUN 12 9 8   CREATININE 0.35* 0.34* 0.32*  CALCIUM 7.8* 7.6* 7.8*   LFT Recent Labs    07/09/20 0930  PROT 5.8*  ALBUMIN 2.9*  AST 35  ALT 27  ALKPHOS 122  BILITOT 0.8   PT/INR No results for input(s): LABPROT, INR in the last 72 hours. Hepatitis Panel No results for input(s): HEPBSAG, HCVAB, HEPAIGM, HEPBIGM in the last 72 hours.  EEG  Result Date: 07/08/2020 Lora Havens, MD     07/08/2020  1:03 PM Patient Name: Kim Hicks MRN: [id number] Epilepsy Attending: Lora Havens Referring Physician/Provider: Dr. Amie Portland Date: 07/08/2020 Duration: 23.55 mins Patient history: 86 old female with altered mental status, noted to have speech disturbance.  EEG to evaluate for seizures. Level of alertness: Awake, asleep AEDs during EEG study: None Technical aspects: This EEG study was done with scalp electrodes positioned according to the 10-20 International system of electrode placement. Electrical activity was acquired at a sampling rate of 500Hz  and reviewed with a high frequency filter of 70Hz  and a low frequency filter of 1Hz . EEG data were recorded continuously and digitally stored. Description: The posterior dominant rhythm consists of 8 Hz activity of moderate voltage (25-35 uV) seen predominantly in posterior head regions, symmetric and reactive to eye opening and eye closing. Sleep was  characterized by sleep spindles (12- 14 Hz), maximal frontocentral region. EEG showed continuous generalized 3 to 6 Hz theta-delta slowing. Generalized periodic discharges with triphasic morphology were noted at 1-1.5hz  which were predominantly seen when patient was awake/stimulated. Hyperventilation and photic stimulation were not performed.   ABNORMALITY -Continuous slow, generalized -Periodic discharges with triphasic morphology, generalized IMPRESSION: This study is suggestive of mild diffuse encephalopathy, nonspecific to etiology but could be secondary to toxic-metabolic causes. No seizures or definite epileptiform discharges were seen throughout the recording. Lora Havens   CT HEAD WO CONTRAST  Result Date: 07/07/2020 CLINICAL DATA:  Mental status change. EXAM: CT HEAD WITHOUT CONTRAST TECHNIQUE: Contiguous axial images were obtained from the base of the skull through the vertex without intravenous contrast. COMPARISON:  02/16/2020 FINDINGS: Brain: Stable mild age advanced cerebral atrophy. The ventricles are in the midline without mass effect or shift. No extra-axial fluid collections. No CT findings for acute  hemispheric infarction or intracranial hemorrhage. No extra-axial fluid collections are identified. Brainstem and cerebellum are grossly normal. Vascular: No aneurysm or hyperdense vessels. Skull: No skull fracture or bone lesions. Sinuses/Orbits: The paranasal sinuses and mastoid air cells are clear. The globes are intact. Other: No scalp lesions or scalp hematoma. IMPRESSION: 1. Stable mild age advanced cerebral atrophy. 2. No acute intracranial findings or mass lesions. Electronically Signed   By: Marijo Sanes M.D.   On: 07/07/2020 15:12   MR BRAIN WO CONTRAST  Result Date: 07/08/2020 CLINICAL DATA:  Neuro deficit, acute, stroke suspected. EXAM: MRI HEAD WITHOUT CONTRAST TECHNIQUE: Multiplanar, multiecho pulse sequences of the brain and surrounding structures were obtained without  intravenous contrast. COMPARISON:  Head CT July 07, 2020. FINDINGS: The study is partially degraded by motion. Brain: No acute infarction, hemorrhage, hydrocephalus, extra-axial collection or mass lesion. Vascular: Normal flow voids. Skull and upper cervical spine: Normal marrow signal. Sinuses/Orbits: Bilateral lens surgery. Paranasal sinuses are clear. IMPRESSION: 1. Motion degraded study. 2. No acute intracranial abnormality identified. Electronically Signed   By: Pedro Earls M.D.   On: 07/08/2020 11:20   DG Chest Port 1 View  Result Date: 07/08/2020 CLINICAL DATA:  Aspiration. EXAM: PORTABLE CHEST 1 VIEW COMPARISON:  July 05, 2018. FINDINGS: Feeding tube courses below the diaphragm and outside the field of view. Right IJ central venous catheter with the tip projecting at the right atrium, similar to prior. Interval removal of the right PICC. Improved bilateral airspace opacities. No visible pleural effusions or pneumothorax. Similar cardiomediastinal silhouette. IMPRESSION: 1. Improved bilateral airspace opacities. 2. Support devices as above. Electronically Signed   By: Margaretha Sheffield MD   On: 07/08/2020 07:39      Active Problems:   Insulin dependent type 2 diabetes mellitus ([place])   Hyperlipidemia associated with type 2 diabetes mellitus ([place])   Dehydration   Hypertension associated with diabetes ([place])   GERD   Chronic constipation   Abdominal pain, right upper quadrant   Other cirrhosis of liver ([facility])   NASH (nonalcoholic steatohepatitis)   Diabetic peripheral neuropathy ([place])   Post-ERCP acute pancreatitis   Pancreatitis   Pressure injury of skin   Septic shock ([place])   Acute respiratory failure with hypoxemia ([facility])   Metabolic encephalopathy     LOS: 11 days   [name] ,NP 07/09/2020, 12:00 PM

## 2020-07-09 NOTE — Progress Notes (Signed)
Subjective/Chief Complaint: No significant changes No IR procedures yesterday Copious diarrhea - lactulose adjusted MRI Brain - no acute abnormalities Drain less than 80 cc of output   Objective: Vital signs in last 24 hours: Temp:  [97.8 F (36.6 C)-98.7 F (37.1 C)] 98.4 F (36.9 C) (02/15 0400) Pulse Rate:  [79-90] 87 (02/15 0720) Resp:  [18-28] 23 (02/15 0720) BP: (124-143)/(41-84) 138/50 (02/15 0700) SpO2:  [91 %-100 %] 94 % (02/15 0720) Last BM Date: 07/08/20  Intake/Output from previous day: 02/14 0701 - 02/15 0700 In: 6829.4 [I.V.:4217.6; BS/JG:2836.6; IV Piggyback:696.3] Out: 86 [Urine:1; Drains:80; Stool:5] Intake/Output this shift: No intake/output data recorded.  Abd - soft, NT, minimal drain output  Lab Results:  Recent Labs    07/08/20 0630 07/09/20 0455  WBC 4.0 3.9*  HGB 8.7* 8.8*  HCT 27.6* 29.3*  PLT 107* 107*   BMET Recent Labs    07/08/20 0630 07/08/20 1700  NA 142 140  K 3.1* 3.2*  CL 112* 112*  CO2 21* 20*  GLUCOSE 114* 142*  BUN 12 9  CREATININE 0.35* 0.34*  CALCIUM 7.8* 7.6*   PT/INR No results for input(s): LABPROT, INR in the last 72 hours. ABG Recent Labs    07/06/20 2100  HCO3 18.9*    Studies/Results: EEG  Result Date: 07/08/2020 Lora Havens, MD     07/08/2020  1:03 PM Patient Name: Kim Hicks MRN: [id number] Epilepsy Attending: Lora Havens Referring Physician/Provider: Dr. Amie Portland Date: 07/08/2020 Duration: 23.55 mins Patient history: 65 old female with altered mental status, noted to have speech disturbance.  EEG to evaluate for seizures. Level of alertness: Awake, asleep AEDs during EEG study: None Technical aspects: This EEG study was done with scalp electrodes positioned according to the 10-20 International system of electrode placement. Electrical activity was acquired at a sampling rate of 500Hz  and reviewed with a high frequency filter of 70Hz  and a low frequency filter of 1Hz . EEG data were  recorded continuously and digitally stored. Description: The posterior dominant rhythm consists of 8 Hz activity of moderate voltage (25-35 uV) seen predominantly in posterior head regions, symmetric and reactive to eye opening and eye closing. Sleep was characterized by sleep spindles (12- 14 Hz), maximal frontocentral region. EEG showed continuous generalized 3 to 6 Hz theta-delta slowing. Generalized periodic discharges with triphasic morphology were noted at 1-1.5hz  which were predominantly seen when patient was awake/stimulated. Hyperventilation and photic stimulation were not performed.   ABNORMALITY -Continuous slow, generalized -Periodic discharges with triphasic morphology, generalized IMPRESSION: This study is suggestive of mild diffuse encephalopathy, nonspecific to etiology but could be secondary to toxic-metabolic causes. No seizures or definite epileptiform discharges were seen throughout the recording. Lora Havens   CT HEAD WO CONTRAST  Result Date: 07/07/2020 CLINICAL DATA:  Mental status change. EXAM: CT HEAD WITHOUT CONTRAST TECHNIQUE: Contiguous axial images were obtained from the base of the skull through the vertex without intravenous contrast. COMPARISON:  02/16/2020 FINDINGS: Brain: Stable mild age advanced cerebral atrophy. The ventricles are in the midline without mass effect or shift. No extra-axial fluid collections. No CT findings for acute hemispheric infarction or intracranial hemorrhage. No extra-axial fluid collections are identified. Brainstem and cerebellum are grossly normal. Vascular: No aneurysm or hyperdense vessels. Skull: No skull fracture or bone lesions. Sinuses/Orbits: The paranasal sinuses and mastoid air cells are clear. The globes are intact. Other: No scalp lesions or scalp hematoma. IMPRESSION: 1. Stable mild age advanced cerebral atrophy. 2. No acute intracranial  findings or mass lesions. Electronically Signed   By: Marijo Sanes M.D.   On: 07/07/2020 15:12    MR BRAIN WO CONTRAST  Result Date: 07/08/2020 CLINICAL DATA:  Neuro deficit, acute, stroke suspected. EXAM: MRI HEAD WITHOUT CONTRAST TECHNIQUE: Multiplanar, multiecho pulse sequences of the brain and surrounding structures were obtained without intravenous contrast. COMPARISON:  Head CT July 07, 2020. FINDINGS: The study is partially degraded by motion. Brain: No acute infarction, hemorrhage, hydrocephalus, extra-axial collection or mass lesion. Vascular: Normal flow voids. Skull and upper cervical spine: Normal marrow signal. Sinuses/Orbits: Bilateral lens surgery. Paranasal sinuses are clear. IMPRESSION: 1. Motion degraded study. 2. No acute intracranial abnormality identified. Electronically Signed   By: Pedro Earls M.D.   On: 07/08/2020 11:20   DG Chest Port 1 View  Result Date: 07/08/2020 CLINICAL DATA:  Aspiration. EXAM: PORTABLE CHEST 1 VIEW COMPARISON:  July 05, 2018. FINDINGS: Feeding tube courses below the diaphragm and outside the field of view. Right IJ central venous catheter with the tip projecting at the right atrium, similar to prior. Interval removal of the right PICC. Improved bilateral airspace opacities. No visible pleural effusions or pneumothorax. Similar cardiomediastinal silhouette. IMPRESSION: 1. Improved bilateral airspace opacities. 2. Support devices as above. Electronically Signed   By: Margaretha Sheffield MD   On: 07/08/2020 07:39    Anti-infectives: Anti-infectives (From admission, onward)   Start     Dose/Rate Route Frequency Ordered Stop   07/04/20 1330  vancomycin (VANCOREADY) IVPB 1500 mg/300 mL  Status:  Discontinued        1,500 mg 150 mL/hr over 120 Minutes Intravenous  Once 07/04/20 1234 07/04/20 1254   07/04/20 1330  vancomycin (VANCOREADY) IVPB 1750 mg/350 mL  Status:  Discontinued        1,750 mg 175 mL/hr over 120 Minutes Intravenous Every 24 hours 07/04/20 1254 07/05/20 0821   07/04/20 1245  piperacillin-tazobactam (ZOSYN)  IVPB 3.375 g        3.375 g 12.5 mL/hr over 240 Minutes Intravenous Every 8 hours 07/04/20 1233     06/27/20 2100  ciprofloxacin (CIPRO) IVPB 400 mg        400 mg 200 mL/hr over 60 Minutes Intravenous Every 12 hours 06/27/20 2057 07/02/20 0944      Assessment/Plan: Altered mental status/concern for aphasia/stroke  - stable head CT 07/07/20 >> neurology following Acute metabolic encephalopathy Post ERCP pancreatitis NASH/Cirrhosis Type 2 diabetes Hypertension Migraines Neuropathy GERD IBS Hypokalemia Anemia  Sepsis 07/04/2020 Persistent bile leak s/p fenestrated laparoscopic cholecystectomy 08/04/2019 Dr. Romana Juniper ERCP/stent placement: 08/07/2019, 11/07/2019, 05/13/2020, 07/14/2020 IR drain placement 05/23/2020; DG sinus tract/fistula tube check leak between decompressed gallbladder fossa abscess cavity/cystic duct remnant  FEN:IV fluids/TF on hold ID: Zosyn 2/10>> day 5 DVT:  Lovenox Follow up:  Dr. Kae Heller  Plan:  IR planning drain change.  Drainage is currently clear with decreasing volume.  LOS: 11 days    Maia Petties 07/09/2020

## 2020-07-09 NOTE — Progress Notes (Signed)
Nutrition Follow-up  DOCUMENTATION CODES:   Not applicable  INTERVENTION:  - diet advancement as medically feasible. - will adjust TF regimen in an attempt to help with diarrhea. - Jevity 1.5 @ 25 ml/hr to advance by 10 ml every 4 hours to reach goal rate of 55 ml/hr with 45 ml Prosource TF TID and 200 ml free water every 4 hours. - at goal rate, this regimen will provide 2100 kcal, 117 grams protein, and 2203 ml free water.    NUTRITION DIAGNOSIS:   Inadequate oral intake related to poor appetite as evidenced by per patient/family report. -ongoing  GOAL:   Patient will meet greater than or equal to 90% of their needs -to be met with TF regimen  MONITOR:   Diet advancement,TF tolerance,Labs,Weight trends,Skin  ASSESSMENT:   55 year old female admitted for post ERCP acute pancreatitis presented with abdominal pain and chills. Past medical history of NASH liver cirrhosis, DM2, migraines, HLD, HTN, neuropathy, GERD, IBS, cholecystitis complicated by persistent bile leak s/p ERCP with stent x 4 on 2/02.  Patient laying in bed, asleep. A female visitor is at bedside, on the phone.   Patient has small bore NGT in L nare and she is currently receiving Osmolite 1.5 @ 35 ml/hr with 45 ml Prosource TF TID and 100 ml free water every 4 hours. This regimen is providing 1380 kcal, 86 grams protein, and 1240 ml free water.  Goal TF regimen is Osmolite 1.5 @ 55 ml/hr with 45 ml Prosource TF TID and 100 ml free water every 4 hours. This regimen will provide 2100 kcal, 116 grams protein, and 1606 ml free water.   Weight is now trending back down and is +4 lb compared to admission (2/3) weight. Skin status updated to reflect new findings of breakdown.  Able to talk with RN who reports that patient is tolerating TF without issue. Pending GI visit today to further clarify plan concerning meds per tube vs oral and when able to transition from TF to oral nutrition. RN reports patient with ongoing  diarrhea 2/2 lactulose.    Surgery note today states that lactulose dose has been adjusted d/t copious amounts of diarrhea.   Labs reviewed; CBGs: 172 and 155 mg/dl, K: 3.2 mmol/l, Cl: 112 mmol/l, creatinine: 0.32 mg/dl, Ca: 7.8 mg/dl. Medications reviewed; sliding scale novolog, 10 units levemir BID, 20 g lactulose BID, 40 mg protonix/day, 40 mEq Klor-Con x1 dose 2/14, 10 mEq IV KCl x4 runs 2/14, 30 mmol IV KPhos x1 run 2/14, 5 ml senokot/day. IVF; D5-LR @ 50 ml/hr (204 kcal/24 hours).   Diet Order:   Diet Order            Diet NPO time specified  Diet effective now                 EDUCATION NEEDS:   Education needs have been addressed  Skin:  Skin Assessment: Skin Integrity Issues: Skin Integrity Issues:: Stage I,Other (Comment) Stage I: sacrum (2/10) Other: MASD bilateral buttocks  Last BM:  2/14 (type 6 x2)  Height:   Ht Readings from Last 1 Encounters:  07/04/20 _0  (1.626 m)    Weight:   Wt Readings from Last 1 Encounters:  07/08/20 74.5 kg    Estimated Nutritional Needs:  Kcal:  2000-2200 Protein:  110-125 Fluid:  >2 L      Jarome Matin, MS, RD, LDN, CNSC Inpatient Clinical Dietitian RD pager # available in [place]  After hours/weekend pager # available  in [facility]

## 2020-07-09 NOTE — Progress Notes (Signed)
eLink Physician-Brief Progress Note Patient Name: Kim Hicks DOB: 04-Jul-1965 MRN: [id number]   Date of Service  07/09/2020  HPI/Events of Note  Patient requests sleep aid.   eICU Interventions  Plan: 1. Melatonin 3 mg per tube Q HS PRN sleep.      Intervention Category Major Interventions: Other:  Maudine Kluesner Cornelia Copa 07/09/2020, 1:13 AM

## 2020-07-09 NOTE — Progress Notes (Addendum)
Referring Physician(s): Dr. Dema Severin C.   Supervising Physician: Daryll Brod  Patient Status:  [facility] - In-pt  Chief Complaint: Image guided biliary grain exchange and downsizing.   Subjective: Patient was originally scheduled for the procedure last week, but she became hypoxic and hypotensive with SBP in 70s. The procedure was put in hold at the moment.  Patient now is more stable, we will proceed with the procedure.   IR was requested for image guided biliary drain exchange and downsizing. The plan is to back out the drain slowly over time per [facility]CS. [facility] originally asked IR to exchange the drain to a straight drain; however, a straight  drain would simply fall out per Dr. Vernard Gambles.   Dr. Jerrye Beavers suggested downsizing of the pigtail drain and [facility] agreed.  Upon evaluation, patient laying in bed comfortably. She is alert and oriented, able to answer questions with no difficulty. Denise headache, fever, chills, shortness of breath, cough, chest pain, abdominal pain, nausea ,vomiting, and bleeding.  Allergies: Talwin [pentazocine]  Medications: Prior to Admission medications   Medication Sig Start Date End Date Taking? Authorizing Provider  acetaminophen (TYLENOL) 500 MG tablet Take 1,000 mg by mouth every 6 (six) hours as needed for moderate pain.   Yes [provider]  brexpiprazole (REXULTI) 2 MG TABS tablet Take 2 mg by mouth at bedtime.   Yes [provider]  Dulaglutide 4.5 MG/0.5ML SOPN Inject 4.5 mg into the skin every Monday. 12/20/18  Yes [provider]  Empagliflozin-metFORMIN HCl ER (SYNJARDY XR) 25-1000 MG TB24 Take 1 tablet by mouth daily before breakfast.  11/15/18  Yes [provider]  ezetimibe (ZETIA) 10 MG tablet Take 10 mg by mouth daily. 04/25/20  Yes [provider]  Insulin Glargine (BASAGLAR KWIKPEN) 100 UNIT/ML Inject 30 Units into the skin at bedtime. 03/20/20  Yes [provider]  LINZESS 290 MCG CAPS capsule  Take 290 mcg by mouth every morning. 08/16/19  Yes [provider]  meloxicam (MOBIC) 15 MG tablet Take 15 mg by mouth daily.   Yes [provider]  metFORMIN (GLUCOPHAGE) 1000 MG tablet Take 1,000 mg by mouth at bedtime. 02/27/20  Yes [provider]  Nutritional Supplements (NUTRITIONAL SHAKE HIGH PROTEIN PO) Take 237 mLs by mouth 2 (two) times daily.   Yes [provider]  ondansetron (ZOFRAN-ODT) 4 MG disintegrating tablet Take 4 mg by mouth every 8 (eight) hours as needed for nausea or vomiting.   Yes [provider]  prazosin (MINIPRESS) 2 MG capsule Take 4 mg by mouth daily.   Yes [provider]  pregabalin (LYRICA) 100 MG capsule TAKE 1 CAPSULE BY MOUTH THREE TIMES A DAY Patient taking differently: Take 100 mg by mouth 3 (three) times daily. 02/12/20  Yes [name], NP  QUEtiapine (SEROQUEL) 400 MG tablet Take 800 mg by mouth at bedtime.   Yes [provider]  simvastatin (ZOCOR) 10 MG tablet Take 10 mg by mouth daily. 12/13/17  Yes [provider]  topiramate (TOPAMAX) 200 MG tablet Take 200 mg by mouth 2 (two) times daily. 04/12/20  Yes [provider]  vortioxetine HBr ([name]) 20 MG TABS tablet Take 20 mg by mouth at bedtime.   Yes [provider]  ziprasidone ([name]) 20 MG capsule Take 20 mg by mouth at bedtime.   Yes [provider]  amoxicillin-clavulanate (AUGMENTIN) 875-125 MG tablet Take 1 tablet by mouth 2 (two) times daily. Patient taking differently: Take 1 tablet  by mouth 2 (two) times daily. Start date : 06/13/20 06/13/20   Prudencio Pair T, MD  lisinopril (ZESTRIL) 20 MG tablet TAKE 1 TABLET BY MOUTH EVERY DAY Patient not taking: No sig reported 02/08/20   [name], MD  pantoprazole (PROTONIX) 40 MG tablet Take 1 tablet (40 mg total) by mouth daily. Patient not taking: No sig reported 06/26/20 06/26/21  Mansouraty, Telford Nab., MD     Vital Signs: BP (!) 147/59    Pulse 88   Temp 98.3 F (36.8 C) (Oral)   Resp 20   Ht 5' 4"  (1.626 m)   Wt 164 lb 3.9 oz (74.5 kg)   SpO2 96%   BMI 28.19 kg/m   Physical Exam Cardiovascular:     Rate and Rhythm: Normal rate and regular rhythm.     Heart sounds: Normal heart sounds.  Pulmonary:     Effort: Pulmonary effort is normal.     Breath sounds: Normal breath sounds.  Abdominal:     General: Bowel sounds are normal.     Comments: Positive for RUQ drain.  Dressing and site clean, dry, intact.  Scant clear output in the gravity bag.   Neurological:     Mental Status: She is alert and oriented to person, place, and time.     Imaging: EEG  Result Date: 07/08/2020 Lora Havens, MD     07/08/2020  1:03 PM Patient Name: KATHEE TUMLIN MRN: [id number] Epilepsy Attending: Lora Havens Referring Physician/Provider: Dr. Amie [place] Date: 07/08/2020 Duration: 23.55 mins Patient history: 78 old female with altered mental status, noted to have speech disturbance.  EEG to evaluate for seizures. Level of alertness: Awake, asleep AEDs during EEG study: None Technical aspects: This EEG study was done with scalp electrodes positioned according to the 10-20 International system of electrode placement. Electrical activity was acquired at a sampling rate of 500Hz  and reviewed with a high frequency filter of 70Hz  and a low frequency filter of 1Hz . EEG data were recorded continuously and digitally stored. Description: The posterior dominant rhythm consists of 8 Hz activity of moderate voltage (25-35 uV) seen predominantly in posterior head regions, symmetric and reactive to eye opening and eye closing. Sleep was characterized by sleep spindles (12- 14 Hz), maximal frontocentral region. EEG showed continuous generalized 3 to 6 Hz theta-delta slowing. Generalized periodic discharges with triphasic morphology were noted at 1-1.5hz  which were predominantly seen when patient was awake/stimulated. Hyperventilation and photic  stimulation were not performed.   ABNORMALITY -Continuous slow, generalized -Periodic discharges with triphasic morphology, generalized IMPRESSION: This study is suggestive of mild diffuse encephalopathy, nonspecific to etiology but could be secondary to toxic-metabolic causes. No seizures or definite epileptiform discharges were seen throughout the recording. Lora Havens   CT HEAD WO CONTRAST  Result Date: 07/07/2020 CLINICAL DATA:  Mental status change. EXAM: CT HEAD WITHOUT CONTRAST TECHNIQUE: Contiguous axial images were obtained from the base of the skull through the vertex without intravenous contrast. COMPARISON:  02/16/2020 FINDINGS: Brain: Stable mild age advanced cerebral atrophy. The ventricles are in the midline without mass effect or shift. No extra-axial fluid collections. No CT findings for acute hemispheric infarction or intracranial hemorrhage. No extra-axial fluid collections are identified. Brainstem and cerebellum are grossly normal. Vascular: No aneurysm or hyperdense vessels. Skull: No skull fracture or bone lesions. Sinuses/Orbits: The paranasal sinuses and mastoid air cells are clear. The globes are intact. Other: No scalp lesions or scalp hematoma. IMPRESSION: 1. Stable mild age advanced  cerebral atrophy. 2. No acute intracranial findings or mass lesions. Electronically Signed   By: Marijo Sanes M.D.   On: 07/07/2020 15:12   MR BRAIN WO CONTRAST  Result Date: 07/08/2020 CLINICAL DATA:  Neuro deficit, acute, stroke suspected. EXAM: MRI HEAD WITHOUT CONTRAST TECHNIQUE: Multiplanar, multiecho pulse sequences of the brain and surrounding structures were obtained without intravenous contrast. COMPARISON:  Head CT July 07, 2020. FINDINGS: The study is partially degraded by motion. Brain: No acute infarction, hemorrhage, hydrocephalus, extra-axial collection or mass lesion. Vascular: Normal flow voids. Skull and upper cervical spine: Normal marrow signal. Sinuses/Orbits: Bilateral  lens surgery. Paranasal sinuses are clear. IMPRESSION: 1. Motion degraded study. 2. No acute intracranial abnormality identified. Electronically Signed   By: Pedro Earls M.D.   On: 07/08/2020 11:20   DG Chest Port 1 View  Result Date: 07/08/2020 CLINICAL DATA:  Aspiration. EXAM: PORTABLE CHEST 1 VIEW COMPARISON:  July 05, 2018. FINDINGS: Feeding tube courses below the diaphragm and outside the field of view. Right IJ central venous catheter with the tip projecting at the right atrium, similar to prior. Interval removal of the right PICC. Improved bilateral airspace opacities. No visible pleural effusions or pneumothorax. Similar cardiomediastinal silhouette. IMPRESSION: 1. Improved bilateral airspace opacities. 2. Support devices as above. Electronically Signed   By: Margaretha Sheffield MD   On: 07/08/2020 07:39    Labs:  CBC: Recent Labs    07/06/20 0411 07/07/20 0740 07/08/20 0630 07/09/20 0455  WBC 7.4 6.1 4.0 3.9*  HGB 10.1* 9.0* 8.7* 8.8*  HCT 33.6* 28.6* 27.6* 29.3*  PLT 149* 133* 107* 107*    COAGS: Recent Labs    08/05/19 0530 05/20/20 2100 06/27/20 1700  INR 1.2 1.6* 1.0  APTT 30 58*  --     BMP: Recent Labs    08/11/19 0445 11/07/19 1204 02/16/20 0520 05/20/20 1152 07/08/20 0528 07/08/20 0630 07/08/20 1700 07/09/20 0930  NA 142 140 138   < > QUESTIONABLE RESULTS, RECOMMEND RECOLLECT TO VERIFY 142 140 140  K 4.0 4.9 3.9   < > QUESTIONABLE RESULTS, RECOMMEND RECOLLECT TO VERIFY 3.1* 3.2* 3.2*  CL 104 108 107   < > QUESTIONABLE RESULTS, RECOMMEND RECOLLECT TO VERIFY 112* 112* 112*  CO2 29 20* 17*   < > QUESTIONABLE RESULTS, RECOMMEND RECOLLECT TO VERIFY 21* 20* 20*  GLUCOSE 221* 321* 339*   < > QUESTIONABLE RESULTS, RECOMMEND RECOLLECT TO VERIFY 114* 142* 227*  BUN 8 31* 39*   < > QUESTIONABLE RESULTS, RECOMMEND RECOLLECT TO VERIFY 12 9 8   CALCIUM 8.2* 8.4* 8.1*   < > QUESTIONABLE RESULTS, RECOMMEND RECOLLECT TO VERIFY 7.8* 7.6* 7.8*   CREATININE 0.40* 0.57 1.08*   < > QUESTIONABLE RESULTS, RECOMMEND RECOLLECT TO VERIFY 0.35* 0.34* 0.32*  GFRNONAA >60 >60 58*   < > QUESTIONABLE RESULTS, RECOMMEND RECOLLECT TO VERIFY >60 >60 >60  GFRAA >60 >60 >60  --  QUESTIONABLE RESULTS, RECOMMEND RECOLLECT TO VERIFY  --   --   --    < > = values in this interval not displayed.    LIVER FUNCTION TESTS: Recent Labs    07/05/20 0542 07/06/20 0411 07/07/20 0740 07/09/20 0930  BILITOT 0.7 0.6 1.1 0.8  AST 75* 59* 50* 35  ALT 47* 41 34 27  ALKPHOS 95 79 89 122  PROT 5.7* 5.7* 5.9* 5.8*  ALBUMIN 2.8* 3.0* 2.9* 2.9*    Assessment and Plan: 55 year old female with biliary drain originally placed by Dr. Kathlene Cote in December  2021. IR has been requested to exchange and downsize the drain.  Patient was originally scheduled for the procedure last week; however, she became unstable. Now patient is more stable, IR will proceed with the procedure.  Patient is scheduled for image guided biliary grain exchange and downsizing tomorrow, 07/10/20.    Consent signed, in chart.    All of the patient's questions were answered, patient is agreeable to proceed. Consent signed and in chart.   Electronically Signed: Tera Mater, PA-C 07/09/2020, 3:58 PM   I spent a total of 15 Minutes at the the patient's bedside AND on the patient's hospital floor or unit, greater than 50% of which was counseling/coordinating care for image guided biliary grain exchange and downsizing.

## 2020-07-09 NOTE — Progress Notes (Signed)
PROGRESS NOTE    Kim Hicks  PXT:[id number] DOB: 15-Jul-1965 DOA: 06/27/2020 PCP: [name], FNP   Chief Complaint  Patient presents with  . Post-op Problem  . Abdominal Pain  Brief Narrative: 55 year old female with T2DM, migraine, HLD, HTN, Neuropathy, GERD/IBSNASH & known persistent bile leak after undergoing laparoscopic cholecystectomy 08/04/2019, admitted with post ERCP pancreatitis, being managed conservatively and GI was following closely but she was noted to be obtunded 2/10:Given Narcan,hypotensive,likely aspirated-was transferred to ICU needed pressors for septic shock from aspiration.GI, surg and IR following and also seen by neurology due to concern for slow to resolve encephalopathy. 2/15: Transferred out of ICU to [facility]  Subjective: Seen/examined.  Husband at the bedside. Alert awake oriented Afebrile overnight, blood pressure stable, on room air Drain output less than 80 cc, copious diarrhea Labs pending this morning NG tube in place with tube feeding.  Assessment & Plan:   Post ERCP pancreatitis, with multiple biliary stent/exchanges for persistent bile leak post cholecystectomy in march 2021, s/p 4 stent 2//22.  Being managed by GI/IR/CCS. drain output only 80 cc-IR to continue possible stent exchange.  Sepsis to aspiration/pneumonia, in the setting of encephalopathy, plan is to stop Zosyn after 7 days if not needed from stand point. Chest x-ray infiltrates resolved. Recent Labs  Lab 07/04/20 1310 07/04/20 1438 07/04/20 1934 07/05/20 0542 07/06/20 0411 07/07/20 0740 07/08/20 0630 07/09/20 0455  WBC  --   --   --  12.5* 7.4 6.1 4.0 3.9*  LATICACIDVEN  --  3.3* 1.2  --   --   --   --   --   PROCALCITON 20.40  --   --  45.61 40.69  --   --   --    Persistent acute metabolic encephalopathy likely multifactorial due to multiple illness, opiates pain medication benzos, component of HE due to cirrhosis, seen by neurology, CT head no acute finding underwent MRI  motion degraded no acute stroke, EEG-no epilepsy but suggestive of mild diffuse encephalopathy.  TSH euthyroid, B12 on higher side, ammonia 62>46 .  Continue supportive care, primary etiology management, PT OT delirium precaution.  She is alert awake oriented x3.  Patient reports she is on chronic Lyrica 100 mg tid and Seroquel 800 mg qhs and requesting to resume we will resume at lower dose and slowly uptitrate as she tolerates  Hypokalemia hypophosphatemia due to diarrhea/poor intake, monitor and adjust electrolytes. NASH cirrhosis-lactulose due to diarrhea Diarrhea due to lactulose, on bisacodyl, senna- adjust dose per GI Constipation resolved but now with diarrhea  Anemia likely from chronic disease and had partial PRBC 2/11 Recent Labs  Lab 07/05/20 0636 07/06/20 0411 07/07/20 0740 07/08/20 0630 07/09/20 0455  HGB 8.9* 10.1* 9.0* 8.7* 8.8*  HCT 30.5* 33.6* 28.6* 27.6* 29.3*   Insulin dependent type 2 diabetes mellitus, hba1c uncontrolled 8.92/3.Blood sugar stable on levemir 10 u bid and ssi. Recent Labs  Lab 07/08/20 1556 07/08/20 2028 07/09/20 0358 07/09/20 0737 07/09/20 1126  GLUCAP 153* 120* 172* 155* 201*   Hyperlipidemia associated with type 2 diabetes mellitus (HCC)  Nutrition: Diet Order            Diet NPO time specified  Diet effective now                 Nutrition Problem: Inadequate oral intake Etiology: poor appetite Signs/Symptoms: per patient/family report Interventions: Tube feeding Pt's Body mass index is 28.19 kg/m.  Pressure Ulcer: Pressure Injury 07/04/20 Sacrum Mid Stage 1 -  Intact  skin with non-blanchable redness of a localized area usually over a bony prominence. (Active)  07/04/20 1406  Location: Sacrum  Location Orientation: Mid  Staging: Stage 1 -  Intact skin with non-blanchable redness of a localized area usually over a bony prominence.  Wound Description (Comments):   Present on Admission:     DVT prophylaxis: enoxaparin  (LOVENOX) injection 40 mg Start: 06/30/20 1400 SCDs Start: 06/27/20 2058 Code Status:   Code Status: Full Code  Family Communication: plan of care discussed with patient at bedside. Discussed with the husband at the bedside  Status is: Inpatient Remains inpatient appropriate because:Inpatient level of care appropriate due to severity of illness and For ongoing management of her sepsis, deconditioning, poor p.o., abdominal pain and biliary drainage  Dispo:  Patient From: Home  Planned Disposition: To be determined  Expected discharge date: 2-3 days  Medically stable for discharge: No    Consultants:see note  Procedures:see note  Unresulted Labs (From admission, onward)          Start     Ordered   07/10/20 0500  Phosphorus  Tomorrow morning,   R       Question:  Specimen collection method  Answer:  Unit=Unit collect   07/09/20 0821   07/10/20 0500  Comprehensive metabolic panel  Daily,   R     Question:  Specimen collection method  Answer:  Unit=Unit collect   07/09/20 0821   07/10/20 0500  CBC  Daily,   R     Question:  Specimen collection method  Answer:  Unit=Unit collect   07/09/20 4008          Culture/Microbiology    Component Value Date/Time   SDES  07/04/2020 1309    BLOOD LEFT ANTECUBITAL Performed at [facility], [place] [address], [place], [place] [address]    [place]  07/04/2020 1309    BOTTLES DRAWN AEROBIC ONLY Blood Culture results may not be optimal due to an inadequate volume of blood received in culture bottles Performed at [facility], [place] [address] [place], [place] [address]    CULT  07/04/2020 1309    NO GROWTH 4 DAYS Performed at [place] [address], [place], [place] [address]    REPTSTATUS PENDING 07/04/2020 1309    Other culture-see note  Medications: Scheduled Meds: . bisacodyl  10 mg Rectal Daily  . Chlorhexidine Gluconate Cloth  6 each Topical Daily  . enoxaparin (LOVENOX)  injection  40 mg Subcutaneous Daily  . feeding supplement (PROSource TF)  45 mL Per Tube TID  . free water  200 mL Per Tube Q4H  . insulin aspart  0-20 Units Subcutaneous Q4H  . insulin detemir  10 Units Subcutaneous BID  . lactulose  20 g Per Tube BID  . liver oil-zinc oxide   Topical TID  . mouth rinse  15 mL Mouth Rinse BID  . pantoprazole sodium  40 mg Per Tube QHS  . pregabalin  50 mg Oral TID  . QUEtiapine  200 mg Oral QHS  . sennosides  5 mL Oral QHS  . sodium chloride flush  10-40 mL Intracatheter Q12H   Continuous Infusions: . sodium chloride Stopped (07/08/20 1031)  . dextrose 5% lactated ringers 50 mL/hr at 07/09/20 0737  . feeding supplement (JEVITY 1.5 CAL/FIBER)    . feeding supplement (OSMOLITE 1.5 CAL) 35 mL/hr at 07/09/20 0400  . piperacillin-tazobactam (ZOSYN)  IV Stopped (07/09/20 0853)  . potassium chloride  Antimicrobials: Anti-infectives (From admission, onward)   Start     Dose/Rate Route Frequency Ordered Stop   07/04/20 1330  vancomycin (VANCOREADY) IVPB 1500 mg/300 mL  Status:  Discontinued        1,500 mg 150 mL/hr over 120 Minutes Intravenous  Once 07/04/20 1234 07/04/20 1254   07/04/20 1330  vancomycin (VANCOREADY) IVPB 1750 mg/350 mL  Status:  Discontinued        1,750 mg 175 mL/hr over 120 Minutes Intravenous Every 24 hours 07/04/20 1254 07/05/20 0821   07/04/20 1245  piperacillin-tazobactam (ZOSYN) IVPB 3.375 g        3.375 g 12.5 mL/hr over 240 Minutes Intravenous Every 8 hours 07/04/20 1233     06/27/20 2100  ciprofloxacin (CIPRO) IVPB 400 mg        400 mg 200 mL/hr over 60 Minutes Intravenous Every 12 hours 06/27/20 2057 07/02/20 0944     Objective: Vitals: Today's Vitals   07/09/20 0700 07/09/20 0720 07/09/20 0745 07/09/20 0800  BP: (!) 138/50   (!) 139/50  Pulse: 87 87 91 89  Resp: (!) 21 (!) 23 (!) 21 (!) 23  Temp:    98.4 F (36.9 C)  TempSrc:    Oral  SpO2: 100% 94% 97% 99%  Weight:      Height:      PainSc:  Asleep 0-No  pain     Intake/Output Summary (Last 24 hours) at 07/09/2020 1130 Last data filed at 07/09/2020 2725 Gross per 24 hour  Intake 6955.17 ml  Output 86 ml  Net 6869.17 ml   Filed Weights   07/06/20 0408 07/07/20 0500 07/08/20 0500  Weight: 80.2 kg 77.3 kg 74.5 kg   Weight change:   Intake/Output from previous day: 02/14 0701 - 02/15 0700 In: 6829.4 [I.V.:4217.6; DG/UY:4034.7; IV Piggyback:696.3] Out: 86 [Urine:1; Drains:80; Stool:5] Intake/Output this shift: Total I/O In: 223.6 [I.V.:130; NG/GT:70; IV Piggyback:23.6] Out: -  Filed Weights   07/06/20 0408 07/07/20 0500 07/08/20 0500  Weight: 80.2 kg 77.3 kg 74.5 kg    Examination: General exam: AAOx3, old for age, frail looking, NGT+ HEENT:Oral mucosa moist, Ear/Nose WNL grossly,dentition normal. Respiratory system: bilaterally clear,no wheezing or crackles,no use of accessory muscle, non tender. Cardiovascular system: S1 & S2 +, regular, No JVD. Gastrointestinal system: Abdomen soft, right side abdomen with biliary drain is present, BS+  Nervous System:Alert, awake, moving extremities and grossly nonfocal Extremities: No edema, distal peripheral pulses palpable.  Skin: No rashes,no icterus. MSK: Normal muscle bulk,tone, power   Data Reviewed: I have personally reviewed following labs and imaging studies CBC: Recent Labs  Lab 07/05/20 0542 07/05/20 0636 07/06/20 0411 07/07/20 0740 07/08/20 0630 07/09/20 0455  WBC 12.5*  --  7.4 6.1 4.0 3.9*  HGB 10.5* 8.9* 10.1* 9.0* 8.7* 8.8*  HCT 35.8* 30.5* 33.6* 28.6* 27.6* 29.3*  MCV 84.8  --  81.8 77.3* 78.2* 80.9  PLT 182  --  149* 133* 107* 425*   Basic Metabolic Panel: Recent Labs  Lab 07/02/20 1401 07/02/20 1630 07/03/20 0501 07/04/20 0508 07/06/20 2044 07/06/20 2207 07/07/20 1631 07/08/20 0528 07/08/20 0630 07/08/20 1700 07/09/20 0930  NA  --   --  143   < > 140   < > 141 QUESTIONABLE RESULTS, RECOMMEND RECOLLECT TO VERIFY 142 140 140  K  --   --  4.0   < >  2.3*   < > 2.7* QUESTIONABLE RESULTS, RECOMMEND RECOLLECT TO VERIFY 3.1* 3.2* 3.2*  CL  --   --  112*   < > 116*   < > 115* QUESTIONABLE RESULTS, RECOMMEND RECOLLECT TO VERIFY 112* 112* 112*  CO2  --   --  25   < > 15*   < > 20* QUESTIONABLE RESULTS, RECOMMEND RECOLLECT TO VERIFY 21* 20* 20*  GLUCOSE  --   --  126*   < > 570*   < > 127* QUESTIONABLE RESULTS, RECOMMEND RECOLLECT TO VERIFY 114* 142* 227*  BUN  --   --  9   < > 21*   < > 15 QUESTIONABLE RESULTS, RECOMMEND RECOLLECT TO VERIFY 12 9 8   CREATININE  --   --  0.58   < > 0.65   < > 0.37* QUESTIONABLE RESULTS, RECOMMEND RECOLLECT TO VERIFY 0.35* 0.34* 0.32*  CALCIUM  --   --  8.8*   < > 6.7*   < > 6.9* QUESTIONABLE RESULTS, RECOMMEND RECOLLECT TO VERIFY 7.8* 7.6* 7.8*  MG 1.7 1.7 1.8  --  2.0  --  1.8  --   --   --  1.7  PHOS 3.0 2.7 2.8  --   --   --   --   --  <1.0*  --   --    < > = values in this interval not displayed.   GFR: Estimated Creatinine Clearance: 78.5 mL/min (A) (by C-G formula based on SCr of 0.32 mg/dL (L)). Liver Function Tests: Recent Labs  Lab 07/04/20 0508 07/05/20 0542 07/06/20 0411 07/07/20 0740 07/09/20 0930  AST 61* 75* 59* 50* 35  ALT 38 47* 41 34 27  ALKPHOS 128* 95 79 89 122  BILITOT 0.4 0.7 0.6 1.1 0.8  PROT 6.8 5.7* 5.7* 5.9* 5.8*  ALBUMIN 3.6 2.8* 3.0* 2.9* 2.9*   Recent Labs  Lab 07/03/20 0507  LIPASE 34   Recent Labs  Lab 07/06/20 2043 07/07/20 1830  AMMONIA 62* 46*   Coagulation Profile: No results for input(s): INR, PROTIME in the last 168 hours. Cardiac Enzymes: No results for input(s): CKTOTAL, CKMB, CKMBINDEX, TROPONINI in the last 168 hours. BNP (last 3 results) No results for input(s): PROBNP in the last 8760 hours. HbA1C: No results for input(s): HGBA1C in the last 72 hours. CBG: Recent Labs  Lab 07/08/20 1556 07/08/20 2028 07/09/20 0358 07/09/20 0737 07/09/20 1126  GLUCAP 153* 120* 172* 155* 201*   Lipid Profile: No results for input(s): CHOL, HDL, LDLCALC,  TRIG, CHOLHDL, LDLDIRECT in the last 72 hours. Thyroid Function Tests: Recent Labs    07/08/20 0630  TSH 2.609   Anemia Panel: Recent Labs    07/08/20 0630  VITAMINB12 1,484*   Sepsis Labs: Recent Labs  Lab 07/04/20 1310 07/04/20 1438 07/04/20 1934 07/05/20 0542 07/06/20 0411  PROCALCITON 20.40  --   --  45.61 40.69  LATICACIDVEN  --  3.3* 1.2  --   --     Recent Results (from the past 240 hour(s))  MRSA PCR Screening     Status: None   Collection Time: 07/04/20 10:52 AM   Specimen: Nasal Mucosa; Nasopharyngeal  Result Value Ref Range Status   MRSA by PCR NEGATIVE NEGATIVE Final    Comment:        The GeneXpert MRSA Assay (FDA approved for NASAL specimens only), is one component of a comprehensive MRSA colonization surveillance program. It is not intended to diagnose MRSA infection nor to guide or monitor treatment for MRSA infections. Performed at [facility], [place] [address] [place], [place] [address]  Culture, blood (Routine X 2) w Reflex to ID Panel     Status: None (Preliminary result)   Collection Time: 07/04/20 12:58 PM   Specimen: BLOOD  Result Value Ref Range Status   Specimen Description   Final    BLOOD RIGHT ANTECUBITAL Performed at [facility] [address], [place], [place] [address]    Special Requests   Final    BOTTLES DRAWN AEROBIC AND ANAEROBIC Blood Culture adequate volume Performed at [place] [address] [place], [place] [address]    Culture   Final    NO GROWTH 4 DAYS Performed at [facility], [place] [address], [place], [place] [address]    Report Status PENDING  Incomplete  Culture, blood (Routine X 2) w Reflex to ID Panel     Status: None (Preliminary result)   Collection Time: 07/04/20  1:09 PM   Specimen: BLOOD  Result Value Ref Range Status   Specimen Description   Final    BLOOD LEFT ANTECUBITAL Performed at [facility]  [address], [place], [place] [address]    Special Requests   Final    BOTTLES DRAWN AEROBIC ONLY Blood Culture results may not be optimal due to an inadequate volume of blood received in culture bottles Performed at [facility] [address], [place], [place] [address]    Culture   Final    NO GROWTH 4 DAYS Performed at [facility], [place] [address] [place],  [address]    Report Status PENDING  Incomplete     Radiology Studies: EEG  Result Date: 07/08/2020 Lora Havens, MD     07/08/2020  1:03 PM Patient Name: TASMIN EXANTUS MRN: [id number] Epilepsy Attending: Lora Havens Referring Physician/Provider: Dr. Amie Portland Date: 07/08/2020 Duration: 23.55 mins Patient history: 26 old female with altered mental status, noted to have speech disturbance.  EEG to evaluate for seizures. Level of alertness: Awake, asleep AEDs during EEG study: None Technical aspects: This EEG study was done with scalp electrodes positioned according to the 10-20 International system of electrode placement. Electrical activity was acquired at a sampling rate of 500Hz  and reviewed with a high frequency filter of 70Hz  and a low frequency filter of 1Hz . EEG data were recorded continuously and digitally stored. Description: The posterior dominant rhythm consists of 8 Hz activity of moderate voltage (25-35 uV) seen predominantly in posterior head regions, symmetric and reactive to eye opening and eye closing. Sleep was characterized by sleep spindles (12- 14 Hz), maximal frontocentral region. EEG showed continuous generalized 3 to 6 Hz theta-delta slowing. Generalized periodic discharges with triphasic morphology were noted at 1-1.5hz  which were predominantly seen when patient was awake/stimulated. Hyperventilation and photic stimulation were not performed.   ABNORMALITY -Continuous slow, generalized -Periodic discharges with triphasic morphology, generalized IMPRESSION: This study is suggestive  of mild diffuse encephalopathy, nonspecific to etiology but could be secondary to toxic-metabolic causes. No seizures or definite epileptiform discharges were seen throughout the recording. Lora Havens   CT HEAD WO CONTRAST  Result Date: 07/07/2020 CLINICAL DATA:  Mental status change. EXAM: CT HEAD WITHOUT CONTRAST TECHNIQUE: Contiguous axial images were obtained from the base of the skull through the vertex without intravenous contrast. COMPARISON:  02/16/2020 FINDINGS: Brain: Stable mild age advanced cerebral atrophy. The ventricles are in the midline without mass effect or shift. No extra-axial fluid collections. No CT findings for acute hemispheric infarction or intracranial  hemorrhage. No extra-axial fluid collections are identified. Brainstem and cerebellum are grossly normal. Vascular: No aneurysm or hyperdense vessels. Skull: No skull fracture or bone lesions. Sinuses/Orbits: The paranasal sinuses and mastoid air cells are clear. The globes are intact. Other: No scalp lesions or scalp hematoma. IMPRESSION: 1. Stable mild age advanced cerebral atrophy. 2. No acute intracranial findings or mass lesions. Electronically Signed   By: Marijo Sanes M.D.   On: 07/07/2020 15:12   MR BRAIN WO CONTRAST  Result Date: 07/08/2020 CLINICAL DATA:  Neuro deficit, acute, stroke suspected. EXAM: MRI HEAD WITHOUT CONTRAST TECHNIQUE: Multiplanar, multiecho pulse sequences of the brain and surrounding structures were obtained without intravenous contrast. COMPARISON:  Head CT July 07, 2020. FINDINGS: The study is partially degraded by motion. Brain: No acute infarction, hemorrhage, hydrocephalus, extra-axial collection or mass lesion. Vascular: Normal flow voids. Skull and upper cervical spine: Normal marrow signal. Sinuses/Orbits: Bilateral lens surgery. Paranasal sinuses are clear. IMPRESSION: 1. Motion degraded study. 2. No acute intracranial abnormality identified. Electronically Signed   By: Pedro Earls M.D.   On: 07/08/2020 11:20   DG Chest Port 1 View  Result Date: 07/08/2020 CLINICAL DATA:  Aspiration. EXAM: PORTABLE CHEST 1 VIEW COMPARISON:  July 05, 2018. FINDINGS: Feeding tube courses below the diaphragm and outside the field of view. Right IJ central venous catheter with the tip projecting at the right atrium, similar to prior. Interval removal of the right PICC. Improved bilateral airspace opacities. No visible pleural effusions or pneumothorax. Similar cardiomediastinal silhouette. IMPRESSION: 1. Improved bilateral airspace opacities. 2. Support devices as above. Electronically Signed   By: Margaretha Sheffield MD   On: 07/08/2020 07:39     LOS: 2 days   Antonieta Pert, MD [facility]  07/09/2020, 11:30 AM

## 2020-07-09 NOTE — Progress Notes (Signed)
Neurology Progress Note  S: Feels well this am except for nausea. Denies pain. States her mind has cleared up greatly. States she did not sleep at all last night. No aphasia now. Patient was seen 07/08/20 by neurology who felt her AMS was due to toxic metabolic encephalopathy, but ordered stroke evaluation. Also, recommendations for Vitamin B12 level, TSH, and electrolytes.   O: Current vital signs: BP (!) 131/53   Pulse 91   Temp 98.3 F (36.8 C) (Oral)   Resp (!) 35   Ht 5' 4"  (1.626 m)   Wt 74.5 kg   SpO2 95%   BMI 28.19 kg/m  Vital signs in last 24 hours: Temp:  [98.3 F (36.8 C)-98.7 F (37.1 C)] 98.3 F (36.8 C) (02/15 1200) Pulse Rate:  [86-91] 91 (02/15 1400) Resp:  [18-35] 35 (02/15 1400) BP: (120-143)/(35-70) 131/53 (02/15 1400) SpO2:  [90 %-100 %] 95 % (02/15 1400)  GENERAL: Awake, alert for most of exam, but dozes off sometimes.  HEENT: Normocephalic and atraumatic, dry mm LUNGS: Normal respiratory effort.  CV: RRR on tele.  ABDOMEN: Soft, nontender Ext: warm  NEURO:  Mental Status: AA&Ox3  Speech/Language: speech is without dysarthria or aphasia.  Naming, repetition, fluency, and comprehension intact.  Cranial Nerves:  II: PERRL. Visual fields full. III, IV, VI: EOMI. Eyelids elevate symmetrically.  V: Sensation is intact to light touch and symmetrical to face.  VII: Smile is symmetrical. Able to puff cheeks and raise eyebrows.  VIII: hearing intact to voice. IX, X: Palate elevates symmetrically. Phonation is normal.  JS:EGBTDVVO shrug 5/5. XII: tongue is midline without fasciculations. Motor: 5/5 strength to all muscle groups tested.  Tone: is normal and bulk is normal Sensation- Intact to light touch bilaterally. Able to tell which side she is being touched on.  Coordination: FTN intact bilaterally. No ataxia, no drift.  Gait- deferred  Medications  Current Facility-Administered Medications:  .  0.9 %  sodium chloride infusion, , Intravenous, PRN,  Bonnielee Haff, MD, Stopped at 07/08/20 1031 .  acetaminophen (TYLENOL) tablet 650 mg, 650 mg, Oral, Q6H PRN, 650 mg at 07/09/20 1454 **OR** acetaminophen (TYLENOL) suppository 650 mg, 650 mg, Rectal, Q6H PRN, Doutova, Anastassia, MD, 650 mg at 07/05/20 0156 .  bisacodyl (DULCOLAX) suppository 10 mg, 10 mg, Rectal, Daily, Saverio Danker, PA-C .  Chlorhexidine Gluconate Cloth 2 % PADS 6 each, 6 each, Topical, Daily, Bonnielee Haff, MD, 6 each at 07/08/20 0400 .  dextrose 5 % in lactated ringers infusion, , Intravenous, Continuous, Erick Colace, NP, Last Rate: 50 mL/hr at 07/09/20 0737, New Bag at 07/09/20 0737 .  enoxaparin (LOVENOX) injection 40 mg, 40 mg, Subcutaneous, Daily, Bonnielee Haff, MD, 40 mg at 07/09/20 1607 .  feeding supplement (JEVITY 1.5 CAL/FIBER) liquid 1,000 mL, 1,000 mL, Per Tube, Continuous, Kc, Ramesh, MD .  feeding supplement (OSMOLITE 1.5 CAL) liquid 1,000 mL, 1,000 mL, Per Tube, Continuous, Kc, Ramesh, MD, Last Rate: 35 mL/hr at 07/09/20 0400, Rate Change at 07/09/20 0400 .  feeding supplement (PROSource TF) liquid 45 mL, 45 mL, Per Tube, TID, Bonnielee Haff, MD, 45 mL at 07/09/20 1323 .  free water 200 mL, 200 mL, Per Tube, Q4H, Kc, Ramesh, MD, 200 mL at 07/09/20 1144 .  insulin aspart (novoLOG) injection 0-20 Units, 0-20 Units, Subcutaneous, Q4H, Rigoberto Noel, MD, 7 Units at 07/09/20 1143 .  insulin detemir (LEVEMIR) injection 10 Units, 10 Units, Subcutaneous, BID, Rigoberto Noel, MD, 10 Units at 07/09/20 [phone] .  lactulose (  CHRONULAC) 10 GM/15ML solution 20 g, 20 g, Per Tube, BID, Willia Craze, NP, 20 g at 07/09/20 0920 .  liver oil-zinc oxide (DESITIN) 40 % ointment, , Topical, TID, Freddi Starr, MD, Given at 07/09/20 [phone] .  MEDLINE mouth rinse, 15 mL, Mouth Rinse, BID, Bonnielee Haff, MD, 15 mL at 07/09/20 2023 .  melatonin tablet 3 mg, 3 mg, Per Tube, QHS PRN, Anders Simmonds, MD, 3 mg at 07/09/20 0335 .  naloxone [facility]) injection 0.4 mg, 0.4 mg,  Intravenous, PRN, Bonnielee Haff, MD, 0.4 mg at 07/04/20 1231 .  ondansetron (ZOFRAN) injection 4 mg, 4 mg, Intravenous, Q6H PRN, Freddi Starr, MD, 4 mg at 07/09/20 1322 .  pantoprazole sodium (PROTONIX) 40 mg/20 mL oral suspension 40 mg, 40 mg, Per Tube, QHS, Bonnielee Haff, MD, 40 mg at 07/08/20 2127 .  piperacillin-tazobactam (ZOSYN) IVPB 3.375 g, 3.375 g, Intravenous, Q8H, Shade, Christine E, RPH, Last Rate: 12.5 mL/hr at 07/09/20 1319, 3.375 g at 07/09/20 1319 .  potassium chloride 10 mEq in 50 mL *CENTRAL LINE* IVPB, 10 mEq, Intravenous, Q1 Hr x 4, Kc, Ramesh, MD, Last Rate: 50 mL/hr at 07/09/20 1420, 10 mEq at 07/09/20 1420 .  pregabalin (LYRICA) capsule 50 mg, 50 mg, Oral, TID, Kc, Ramesh, MD .  QUEtiapine (SEROQUEL) tablet 200 mg, 200 mg, Oral, QHS, Kc, Ramesh, MD .  sennosides (SENOKOT) 8.8 MG/5ML syrup 5 mL, 5 mL, Oral, QHS, Bonnielee Haff, MD, 5 mL at 07/07/20 2244 .  sodium chloride flush (NS) 0.9 % injection 10-40 mL, 10-40 mL, Intracatheter, Q12H, Bonnielee Haff, MD, 30 mL at 07/09/20 0921 .  sodium chloride flush (NS) 0.9 % injection 10-40 mL, 10-40 mL, Intracatheter, PRN, Bonnielee Haff, MD  Pertinent Labs TSH 2.609     ammonia  62 down to 46        Vit B12  1484  Imaging  CT Head 1. Stable mild age advanced cerebral atrophy. 2. No acute intracranial findings or mass lesions.  MRI Brain  1. Motion degraded study. 2. No acute intracranial abnormality identified  Assessment: 55 yo female who presented with abdominal pain and low back pain. On 07/04/20, she had hypotension with increased somnolence and was transferred to ICU. Neurology was consulted due to receptive aphasia and AMS. Thought was patient's presentation was toxic metabolic encephalopathy. Recommendations were for lab work with correction of metabolic derangements, EEG, and MRI brain to r/o stroke.  MRI brain neg for stroke. EEG showed no epileptiform discharges or seizure activity, but was consistent with  mild diffuse encephalopathy.   Impression: Toxic metabolic encephalopathy  Recommendations: 1. Continue treating metabolic derangements as you are doing.  2. Please call us back if patient deteriorates from neurological standpoint.  3. Thank you for involvement in this patient's care. Neurology will be available for questions.   Pt seen by Clance Boll, MSN, APN-BC/Nurse Practitioner/Neuro. Plan was discussed with and approved by MD.   Pager: [phone]

## 2020-07-10 ENCOUNTER — Ambulatory Visit: Payer: Medicare Other | Admitting: Gastroenterology

## 2020-07-10 ENCOUNTER — Inpatient Hospital Stay (HOSPITAL_COMMUNITY): Payer: Medicare Other

## 2020-07-10 DIAGNOSIS — K9189 Other postprocedural complications and disorders of digestive system: Secondary | ICD-10-CM | POA: Diagnosis not present

## 2020-07-10 DIAGNOSIS — K859 Acute pancreatitis without necrosis or infection, unspecified: Secondary | ICD-10-CM | POA: Diagnosis not present

## 2020-07-10 HISTORY — PX: IR EXCHANGE BILIARY DRAIN: IMG6046

## 2020-07-10 LAB — MAGNESIUM: Magnesium: 1.7 mg/dL (ref 1.7–2.4)

## 2020-07-10 LAB — COMPREHENSIVE METABOLIC PANEL
ALT: 23 U/L (ref 0–44)
AST: 30 U/L (ref 15–41)
Albumin: 2.5 g/dL — ABNORMAL LOW (ref 3.5–5.0)
Alkaline Phosphatase: 114 U/L (ref 38–126)
Anion gap: 7 (ref 5–15)
BUN: 8 mg/dL (ref 6–20)
CO2: 23 mmol/L (ref 22–32)
Calcium: 7.7 mg/dL — ABNORMAL LOW (ref 8.9–10.3)
Chloride: 114 mmol/L — ABNORMAL HIGH (ref 98–111)
Creatinine, Ser: 0.37 mg/dL — ABNORMAL LOW (ref 0.44–1.00)
GFR, Estimated: >60 mL/min (ref >60)
Glucose, Bld: 169 mg/dL — ABNORMAL HIGH (ref 70–99)
Potassium: 2.8 mmol/L — ABNORMAL LOW (ref 3.5–5.1)
Sodium: 144 mmol/L (ref 135–145)
Total Bilirubin: 0.6 mg/dL (ref 0.3–1.2)
Total Protein: 5.3 g/dL — ABNORMAL LOW (ref 6.5–8.1)

## 2020-07-10 LAB — CBC
HCT: 27.9 % — ABNORMAL LOW (ref 36.0–46.0)
Hemoglobin: 8.5 g/dL — ABNORMAL LOW (ref 12.0–15.0)
MCH: 24.5 pg — ABNORMAL LOW (ref 26.0–34.0)
MCHC: 30.5 g/dL (ref 30.0–36.0)
MCV: 80.4 fL (ref 80.0–100.0)
Platelets: 141 10*3/uL — ABNORMAL LOW (ref 150–400)
RBC: 3.47 MIL/uL — ABNORMAL LOW (ref 3.87–5.11)
RDW: 17.1 % — ABNORMAL HIGH (ref 11.5–15.5)
WBC: 4.1 10*3/uL (ref 4.0–10.5)
nRBC: 0 % (ref 0.0–0.2)

## 2020-07-10 LAB — GLUCOSE, CAPILLARY
Glucose-Capillary: 172 mg/dL — ABNORMAL HIGH (ref 70–99)
Glucose-Capillary: 92 mg/dL (ref 70–99)
Glucose-Capillary: 148 mg/dL — ABNORMAL HIGH (ref 70–99)
Glucose-Capillary: 219 mg/dL — ABNORMAL HIGH (ref 70–99)
Glucose-Capillary: 144 mg/dL — ABNORMAL HIGH (ref 70–99)

## 2020-07-10 LAB — CULTURE, BLOOD (ROUTINE X 2)
Culture: NO GROWTH
Culture: NO GROWTH
Special Requests: ADEQUATE

## 2020-07-10 LAB — PHOSPHORUS: Phosphorus: 2 mg/dL — ABNORMAL LOW (ref 2.5–4.6)

## 2020-07-10 MED ORDER — POTASSIUM CHLORIDE 20 MEQ PO PACK
40.0000 meq | PACK | Freq: Once | ORAL | Status: AC
  Administered 2020-07-10: 40 meq
  Filled 2020-07-10: qty 2

## 2020-07-10 MED ORDER — ACETAMINOPHEN 325 MG PO TABS
650.0000 mg | ORAL_TABLET | Freq: Four times a day (QID) | ORAL | Status: DC | PRN
  Administered 2020-07-10 – 2020-07-13 (×9): 650 mg via ORAL
  Filled 2020-07-10 (×9): qty 2

## 2020-07-10 MED ORDER — SODIUM CHLORIDE 0.9% FLUSH
5.0000 mL | Freq: Three times a day (TID) | INTRAVENOUS | Status: DC
  Administered 2020-07-10 – 2020-07-13 (×9): 5 mL

## 2020-07-10 MED ORDER — POTASSIUM CHLORIDE 10 MEQ/50ML IV SOLN
10.0000 meq | INTRAVENOUS | Status: AC
Start: 2020-07-10 — End: 2020-07-10
  Administered 2020-07-10 (×2): 10 meq via INTRAVENOUS
  Filled 2020-07-10 (×2): qty 50

## 2020-07-10 MED ORDER — LACTULOSE 10 GM/15ML PO SOLN
20.0000 g | Freq: Two times a day (BID) | ORAL | Status: DC
  Administered 2020-07-10: 20 g via ORAL
  Filled 2020-07-10 (×3): qty 30

## 2020-07-10 MED ORDER — POTASSIUM PHOSPHATES 15 MMOLE/5ML IV SOLN
15.0000 mmol | Freq: Once | INTRAVENOUS | Status: AC
  Administered 2020-07-10: 15 mmol via INTRAVENOUS
  Filled 2020-07-10: qty 5

## 2020-07-10 MED ORDER — FREE WATER: 200.0000 mL | Status: DC

## 2020-07-10 MED ORDER — DEXTROSE IN LACTATED RINGERS 5 % IV SOLN: INTRAVENOUS | Status: AC

## 2020-07-10 MED ORDER — PREGABALIN 50 MG PO CAPS
50.0000 mg | ORAL_CAPSULE | Freq: Three times a day (TID) | ORAL | Status: DC
  Administered 2020-07-10 – 2020-07-13 (×9): 50 mg via ORAL
  Filled 2020-07-10 (×9): qty 1

## 2020-07-10 MED ORDER — INSULIN ASPART 100 UNIT/ML ~~LOC~~ SOLN
0.0000 [IU] | Freq: Three times a day (TID) | SUBCUTANEOUS | Status: DC
  Administered 2020-07-10: 5 [IU] via SUBCUTANEOUS
  Administered 2020-07-11: 3 [IU] via SUBCUTANEOUS
  Administered 2020-07-11: 2 [IU] via SUBCUTANEOUS
  Administered 2020-07-12: 3 [IU] via SUBCUTANEOUS
  Administered 2020-07-12: 8 [IU] via SUBCUTANEOUS
  Administered 2020-07-12: 2 [IU] via SUBCUTANEOUS
  Administered 2020-07-13: 5 [IU] via SUBCUTANEOUS

## 2020-07-10 MED ORDER — QUETIAPINE FUMARATE 200 MG PO TABS
200.0000 mg | ORAL_TABLET | Freq: Every day | ORAL | Status: DC
  Administered 2020-07-10 – 2020-07-12 (×3): 200 mg via ORAL
  Filled 2020-07-10 (×2): qty 2
  Filled 2020-07-10: qty 1

## 2020-07-10 MED ORDER — LIDOCAINE HCL 1 % IJ SOLN
INTRAMUSCULAR | Status: AC
  Administered 2020-07-10: 2 mL
  Filled 2020-07-10: qty 20

## 2020-07-10 MED ORDER — GLUCERNA SHAKE PO LIQD
237.0000 mL | Freq: Two times a day (BID) | ORAL | Status: DC
  Administered 2020-07-11: 237 mL via ORAL
  Filled 2020-07-10 (×5): qty 237

## 2020-07-10 MED ORDER — IOHEXOL 300 MG/ML  SOLN
50.0000 mL | Freq: Once | INTRAMUSCULAR | Status: AC | PRN
  Administered 2020-07-10: 10 mL

## 2020-07-10 MED ORDER — MELATONIN 3 MG PO TABS
3.0000 mg | ORAL_TABLET | Freq: Every evening | ORAL | Status: DC | PRN
  Administered 2020-07-10: 3 mg via ORAL
  Filled 2020-07-10: qty 1

## 2020-07-10 MED ORDER — PANTOPRAZOLE SODIUM 40 MG PO TBEC
40.0000 mg | DELAYED_RELEASE_TABLET | Freq: Every day | ORAL | Status: DC
  Administered 2020-07-10 – 2020-07-12 (×3): 40 mg via ORAL
  Filled 2020-07-10 (×3): qty 1

## 2020-07-10 MED ORDER — MAGNESIUM SULFATE 2 GM/50ML IV SOLN
2.0000 g | Freq: Once | INTRAVENOUS | Status: AC
  Administered 2020-07-10: 2 g via INTRAVENOUS
  Filled 2020-07-10: qty 50

## 2020-07-10 NOTE — TOC Progression Note (Signed)
Transition of Care [facility]) - Progression Note    Patient Details  Name: Kim Hicks MRN: [id number] Date of Birth: March 31, 1966  [facility] CM/SW Contact  Leeroy Cha, RN Phone Number: 07/10/2020, 8:34 AM  Clinical Narrative:    55 year old female with T2DM, migraine, HLD, HTN, Neuropathy, GERD/IBSNASH & known persistent bile leak after undergoing laparoscopic cholecystectomy 08/04/2019, admitted with post ERCP pancreatitis, being managed conservatively and GI was following closely but she was noted to be obtunded 2/10:Given Narcan,hypotensive,likely aspirated-was transferred to ICU needed pressors for septic shock from aspiration.GI, surg and IR following and also seen by neurology due to concern for slow to resolve encephalopathy. 2/15: Transferred out of ICU to [facility]  Subjective: Seen and examined this morning.  She is resting comfortably alert awake oriented. Reports she saw her GI doctor who will be planning to feed her after her procedure today Afebrile overnight, potassium low at 2.8 Phos 2.0 Still having multiple episodes of loose bowel movement. NG tube in place with tube feeding. Right sided biliary drainage with greenish output small volume  Assessment & Plan:  Post ERCP pancreatitis, with multiple biliary stent/exchanges for persistent bile leak post cholecystectomy in march 2021, s/p 4 stent 2//22.  Being managed by GI/IR/CCS. monitor drain output and has decreased, plan for IR drain exchange today  Sepsis to aspiration/pneumonia, in the setting of encephalopathy, plan is to stop Zosyn after 7 days total. Chest x-ray infiltrates resolved.  Currently no leukocytosis afebrile. PLAN: to return to home       Expected Discharge Plan and Services                                                 Social Determinants of Health (SDOH) Interventions    Readmission Risk Interventions No flowsheet data found.

## 2020-07-10 NOTE — Progress Notes (Signed)
CC:  Subjective: Encephalopathy is markedly better.  She is alert and conversing normally.  She has minimal abdominal discomfort.  She knows she is going to have a IR procedure today.  There is some green fluid drainage in the bag this AM.   Objective: Vital signs in last 24 hours: Temp:  [98 F (36.7 C)-98.4 F (36.9 C)] 98.4 F (36.9 C) (02/16 0400) Pulse Rate:  [83-107] 100 (02/16 0500) Resp:  [14-35] 19 (02/16 0500) BP: (100-147)/(35-81) 125/81 (02/16 0500) SpO2:  [90 %-99 %] 97 % (02/16 0500) Weight:  [75.9 kg] 75.9 kg (02/16 0500) Last BM Date: 07/09/20 800 IV TF 788 Urine 300 recorded Nothing recorded for the drain BM x 1 Afebrile, VSS HR up some last PM K+ 2.8 WBC 4.1  Intake/Output from previous day: 02/15 0701 - 02/16 0700 In: 1625.3 [I.V.:575; NG/GT:788.8; IV Piggyback:261.5] Out: 300 [Urine:300] Intake/Output this shift: No intake/output data recorded.  General appearance: alert, cooperative and no distress Resp: Clear to auscultation GI: soft, non-tender; bowel sounds normal; no masses,  no organomegaly and Minimal abdominal discomfort.  IR drain showing some green-colored drainage this AM.  Lab Results:  Recent Labs    07/09/20 0455 07/10/20 0434  WBC 3.9* 4.1  HGB 8.8* 8.5*  HCT 29.3* 27.9*  PLT 107* 141*    BMET Recent Labs    07/09/20 0930 07/10/20 0434  NA 140 144  K 3.2* 2.8*  CL 112* 114*  CO2 20* 23  GLUCOSE 227* 169*  BUN 8 8  CREATININE 0.32* 0.37*  CALCIUM 7.8* 7.7*   PT/INR No results for input(s): LABPROT, INR in the last 72 hours.  Recent Labs  Lab 07/05/20 0542 07/06/20 0411 07/07/20 0740 07/09/20 0930 07/10/20 0434  AST 75* 59* 50* 35 30  ALT 47* 41 34 27 23  ALKPHOS 95 79 89 122 114  BILITOT 0.7 0.6 1.1 0.8 0.6  PROT 5.7* 5.7* 5.9* 5.8* 5.3*  ALBUMIN 2.8* 3.0* 2.9* 2.9* 2.5*     Lipase     Component Value Date/Time   LIPASE 34 07/03/2020 0507     Medications: . bisacodyl  10 mg Rectal Daily   . Chlorhexidine Gluconate Cloth  6 each Topical Daily  . enoxaparin (LOVENOX) injection  40 mg Subcutaneous Daily  . feeding supplement (PROSource TF)  45 mL Per Tube TID  . free water  200 mL Per Tube Q4H  . insulin aspart  0-20 Units Subcutaneous Q4H  . insulin detemir  10 Units Subcutaneous BID  . lactulose  20 g Per Tube BID  . liver oil-zinc oxide   Topical TID  . mouth rinse  15 mL Mouth Rinse BID  . pantoprazole sodium  40 mg Per Tube QHS  . pregabalin  50 mg Per Tube TID  . QUEtiapine  200 mg Per Tube QHS  . sennosides  5 mL Per Tube QHS  . sodium chloride flush  10-40 mL Intracatheter Q12H    Assessment/Plan Altered mental status/concern for aphasia/stroke - stable head CT 07/07/20 >> neurology following>> toxic metabolic encephalopathy  Acute metabolic encephalopathy Post ERCP pancreatitis NASH/Cirrhosis Type 2 diabetes Hypertension Migraines Neuropathy GERD IBS Hypokalemia  - K+ 2.8; Mag+ 1.7 Anemia   Sepsis 07/04/2020 Persistent bile leak s/p fenestrated laparoscopic cholecystectomy 08/04/2019 Dr. Romana Juniper ERCP/stent placement:08/07/2019, 11/07/2019, 05/13/2020, 07/14/2020 IR drain placement 05/23/2020; DG sinus tract/fistula tube check leak between decompressed gallbladder fossa abscess cavity/cystic duct remnant  FEN:IV fluids/TF on hold ID: Zosyn 2/10>> day 7  DVT: Lovenox Follow up: Dr. Kae Heller   Plan: She will need her potassium repleted will defer to medicine for that.  She is for IR drain exchange today.    LOS: 12 days    Kim Hicks 07/10/2020 Please see Amion

## 2020-07-10 NOTE — Procedures (Signed)
Interventional Radiology Procedure Note  Procedure: GB fossa drain downsize  Indication: Biliary leak  Findings: Please refer to procedural dictation for full description.  Complications: None  EBL: < 10 mL  Miachel Roux, MD [phone]

## 2020-07-10 NOTE — Progress Notes (Signed)
PROGRESS NOTE    Kim Hicks  NOI:[id number] DOB: [birth year] DOA: 06/27/2020 PCP: [name], FNP   Chief Complaint  Patient presents with  . Post-op Problem  . Abdominal Pain  Brief Narrative: 55 year old female with T2DM, migraine, HLD, HTN, Neuropathy, GERD/IBSNASH & known persistent bile leak after undergoing laparoscopic cholecystectomy 08/04/2019, admitted with post ERCP pancreatitis, being managed conservatively and GI was following closely but she was noted to be obtunded 2/10:Given Narcan,hypotensive,likely aspirated-was transferred to ICU needed pressors for septic shock from aspiration.GI, surg and IR following and also seen by neurology due to concern for slow to resolve encephalopathy. 2/15: Transferred out of ICU to [facility]  Subjective: Seen and examined this morning.  She is resting comfortably alert awake oriented. Reports she saw her GI doctor who will be planning to feed her after her procedure today Afebrile overnight, potassium low at 2.8 Phos 2.0 Still having multiple episodes of loose bowel movement. NG tube in place with tube feeding. Right sided biliary drainage with greenish output small volume  Assessment & Plan:  Post ERCP pancreatitis, with multiple biliary stent/exchanges for persistent bile leak post cholecystectomy in march 2021, s/p 4 stent 2//22.  Being managed by GI/IR/CCS. monitor drain output and has decreased, plan for IR drain exchange today  Sepsis to aspiration/pneumonia, in the setting of encephalopathy, plan is to stop Zosyn after 7 days total. Chest x-ray infiltrates resolved.  Currently no leukocytosis afebrile. Recent Labs  Lab 07/04/20 1310 07/04/20 1438 07/04/20 1934 07/05/20 0542 07/06/20 0411 07/07/20 0740 07/08/20 0630 07/09/20 0455 07/10/20 0434  WBC  --   --   --  12.5* 7.4 6.1 4.0 3.9* 4.1  LATICACIDVEN  --  3.3* 1.2  --   --   --   --   --   --   PROCALCITON 20.40  --   --  45.61 40.69  --   --   --   --    Persistent acute  metabolic encephalopathy likely multifactorial due to multiple illness, opiates pain medication benzos, component of HE due to cirrhosis, seen by neurology, CT head no acute finding underwent MRI motion degraded no acute stroke, EEG-no epilepsy but suggestive of mild diffuse encephalopathy.  TSH euthyroid, B12 on higher side, ammonia 62>46 .  She is alert awake oriented and conversant.  Continue PT OT delirium precaution. She is on Lyrica 100 mg tid and Seroquel 800 mg qhs and and resuming at lower dose and slowly uptitrate as she tolerates  Hypokalemia hypophosphatemia, hypomagnesemia: Due to GI loss we will replete with K-Phos liquid and IV potassium and IV magnesium and repeat labs.   NASH cirrhosis-lactulose due to diarrhea Diarrhea due to lactulose, senna 5 ml nightly- will hold.  Hold laxatives for diarrhea.   Constipation resolved but now with diarrhea  Anemia likely from chronic disease and had partial PRBC 2/11 hemoglobin overall stable. Recent Labs  Lab 07/06/20 0411 07/07/20 0740 07/08/20 0630 07/09/20 0455 07/10/20 0434  HGB 10.1* 9.0* 8.7* 8.8* 8.5*  HCT 33.6* 28.6* 27.6* 29.3* 27.9*   Insulin dependent type 2 diabetes mellitus, hba1c uncontrolled 8.92/3.blood sugar controlled on levemir 10 u bid and ssi. Recent Labs  Lab 07/09/20 1922 07/09/20 2234 07/09/20 2315 07/10/20 0402 07/10/20 0732  GLUCAP 206* 223* 247* 172* 92   Nutrition: Diet Order            Diet NPO time specified  Diet effective now  Nutrition Problem: Inadequate oral intake Etiology: poor appetite Signs/Symptoms: per patient/family report Interventions: Tube feeding Pt's Body mass index is 28.72 kg/m.  Pressure Ulcer: Pressure Injury 07/04/20 Sacrum Mid Stage 1 -  Intact skin with non-blanchable redness of a localized area usually over a bony prominence. (Active)  07/04/20 1406  Location: Sacrum  Location Orientation: Mid  Staging: Stage 1 -  Intact skin with  non-blanchable redness of a localized area usually over a bony prominence.  Wound Description (Comments):   Present on Admission:     DVT prophylaxis: enoxaparin (LOVENOX) injection 40 mg Start: 06/30/20 1400 SCDs Start: 06/27/20 2058 Code Status:   Code Status: Full Code  Family Communication: plan of care discussed with patient at bedside. Discussed with the husband at the bedside 2/15-no family at bedside this morning. Discussed with the nursing staff.  Status is: Inpatient Remains inpatient appropriate because:Inpatient level of care appropriate due to severity of illness and For ongoing management of her sepsis, deconditioning, poor p.o., abdominal pain and biliary drainage  Dispo:  Patient From: Home  Planned Disposition: To be determined  Expected discharge date: 3 days  Medically stable for discharge: No    Consultants:see note  Procedures:see note  Unresulted Labs (From admission, onward)          Start     Ordered   07/10/20 0500  Comprehensive metabolic panel  Daily,   R     Question:  Specimen collection method  Answer:  Unit=Unit collect   07/09/20 0821   07/10/20 0500  CBC  Daily,   R     Question:  Specimen collection method  Answer:  Unit=Unit collect   07/09/20 7939          Culture/Microbiology    Component Value Date/Time   SDES  07/04/2020 1309    BLOOD LEFT ANTECUBITAL Performed at [facility], [place] [address] [place], [place] [address]    [place]  07/04/2020 1309    BOTTLES DRAWN AEROBIC ONLY Blood Culture results may not be optimal due to an inadequate volume of blood received in culture bottles Performed at [facility], [place] [address] [place], [place] [address]    CULT  07/04/2020 1309    NO GROWTH 4 DAYS Performed at [place] [address], [place], [place] [address]    REPTSTATUS PENDING 07/04/2020 1309    Other culture-see note  Medications: Scheduled Meds: . bisacodyl  10 mg  Rectal Daily  . Chlorhexidine Gluconate Cloth  6 each Topical Daily  . enoxaparin (LOVENOX) injection  40 mg Subcutaneous Daily  . feeding supplement (PROSource TF)  45 mL Per Tube TID  . free water  200 mL Per Tube Q4H  . insulin aspart  0-20 Units Subcutaneous Q4H  . insulin detemir  10 Units Subcutaneous BID  . lactulose  20 g Per Tube BID  . liver oil-zinc oxide   Topical TID  . mouth rinse  15 mL Mouth Rinse BID  . pantoprazole sodium  40 mg Per Tube QHS  . pregabalin  50 mg Per Tube TID  . QUEtiapine  200 mg Per Tube QHS  . sennosides  5 mL Per Tube QHS  . sodium chloride flush  10-40 mL Intracatheter Q12H   Continuous Infusions: . sodium chloride Stopped (07/08/20 1031)  . dextrose 5% lactated ringers 50 mL/hr at 07/09/20 0737  . feeding supplement (JEVITY 1.5 CAL/FIBER) Stopped (07/10/20 0005)  . piperacillin-tazobactam (ZOSYN)  IV 3.375  g (07/10/20 0521)    Antimicrobials: Anti-infectives (From admission, onward)   Start     Dose/Rate Route Frequency Ordered Stop   07/04/20 1330  vancomycin (VANCOREADY) IVPB 1500 mg/300 mL  Status:  Discontinued        1,500 mg 150 mL/hr over 120 Minutes Intravenous  Once 07/04/20 1234 07/04/20 1254   07/04/20 1330  vancomycin (VANCOREADY) IVPB 1750 mg/350 mL  Status:  Discontinued        1,750 mg 175 mL/hr over 120 Minutes Intravenous Every 24 hours 07/04/20 1254 07/05/20 0821   07/04/20 1245  piperacillin-tazobactam (ZOSYN) IVPB 3.375 g        3.375 g 12.5 mL/hr over 240 Minutes Intravenous Every 8 hours 07/04/20 1233     06/27/20 2100  ciprofloxacin (CIPRO) IVPB 400 mg        400 mg 200 mL/hr over 60 Minutes Intravenous Every 12 hours 06/27/20 2057 07/02/20 0944     Objective: Vitals: Today's Vitals   07/10/20 0400 07/10/20 0500 07/10/20 0600 07/10/20 0700  BP: (!) 107/53 125/81 (!) 126/55 139/68  Pulse: 99 100 99 95  Resp: 18 19 (!) 25 (!) 33  Temp: 98.4 F (36.9 C)     TempSrc: Axillary     SpO2: 97% 97% 100% 98%   Weight:  75.9 kg    Height:      PainSc: 0-No pain  0-No pain     Intake/Output Summary (Last 24 hours) at 07/10/2020 0804 Last data filed at 07/09/2020 2010 Gross per 24 hour  Intake 1625.26 ml  Output 300 ml  Net 1325.26 ml   Filed Weights   07/07/20 0500 07/08/20 0500 07/10/20 0500  Weight: 77.3 kg 74.5 kg 75.9 kg   Weight change:   Intake/Output from previous day: 02/15 0701 - 02/16 0700 In: 1625.3 [I.V.:575; NG/GT:788.8; IV Piggyback:261.5] Out: 300 [Urine:300] Intake/Output this shift: No intake/output data recorded. Filed Weights   07/07/20 0500 07/08/20 0500 07/10/20 0500  Weight: 77.3 kg 74.5 kg 75.9 kg    Examination: General exam: AAO,at baseline ng tube +, old for her age HEENT:Oral mucosa moist, Ear/Nose WNL grossly, dentition normal. Respiratory system: bilaterally clearat baseline, conversant Cardiovascular system: S1 & S2 +, No JVD,. Gastrointestinal system: Abdomen soft, NT,ND, BS+,right-sided biliary drainage present Nervous System:Alert, awake,  moving extremities and grossly nonfocal Extremities: No edema, distal peripheral pulses palpable.  Skin: No rashes,no icterus. MSK: Normal muscle bulk,tone, power.  Data Reviewed: I have personally reviewed following labs and imaging studies CBC: Recent Labs  Lab 07/06/20 0411 07/07/20 0740 07/08/20 0630 07/09/20 0455 07/10/20 0434  WBC 7.4 6.1 4.0 3.9* 4.1  HGB 10.1* 9.0* 8.7* 8.8* 8.5*  HCT 33.6* 28.6* 27.6* 29.3* 27.9*  MCV 81.8 77.3* 78.2* 80.9 80.4  PLT 149* 133* 107* 107* 712*   Basic Metabolic Panel: Recent Labs  Lab 07/06/20 2044 07/06/20 2207 07/07/20 1631 07/08/20 0528 07/08/20 0630 07/08/20 1700 07/09/20 0930 07/10/20 0434  NA 140   < > 141 QUESTIONABLE RESULTS, RECOMMEND RECOLLECT TO VERIFY 142 140 140 144  K 2.3*   < > 2.7* QUESTIONABLE RESULTS, RECOMMEND RECOLLECT TO VERIFY 3.1* 3.2* 3.2* 2.8*  CL 116*   < > 115* QUESTIONABLE RESULTS, RECOMMEND RECOLLECT TO VERIFY 112* 112*  112* 114*  CO2 15*   < > 20* QUESTIONABLE RESULTS, RECOMMEND RECOLLECT TO VERIFY 21* 20* 20* 23  GLUCOSE 570*   < > 127* QUESTIONABLE RESULTS, RECOMMEND RECOLLECT TO VERIFY 114* 142* 227* 169*  BUN 21*   < >  15 QUESTIONABLE RESULTS, RECOMMEND RECOLLECT TO VERIFY 12 9 8 8   CREATININE 0.65   < > 0.37* QUESTIONABLE RESULTS, RECOMMEND RECOLLECT TO VERIFY 0.35* 0.34* 0.32* 0.37*  CALCIUM 6.7*   < > 6.9* QUESTIONABLE RESULTS, RECOMMEND RECOLLECT TO VERIFY 7.8* 7.6* 7.8* 7.7*  MG 2.0  --  1.8  --   --   --  1.7 1.7  PHOS  --   --   --   --  <1.0*  --   --  2.0*   < > = values in this interval not displayed.   GFR: Estimated Creatinine Clearance: 79.3 mL/min (A) (by C-G formula based on SCr of 0.37 mg/dL (L)). Liver Function Tests: Recent Labs  Lab 07/05/20 0542 07/06/20 0411 07/07/20 0740 07/09/20 0930 07/10/20 0434  AST 75* 59* 50* 35 30  ALT 47* 41 34 27 23  ALKPHOS 95 79 89 122 114  BILITOT 0.7 0.6 1.1 0.8 0.6  PROT 5.7* 5.7* 5.9* 5.8* 5.3*  ALBUMIN 2.8* 3.0* 2.9* 2.9* 2.5*   No results for input(s): LIPASE, AMYLASE in the last 168 hours. Recent Labs  Lab 07/06/20 2043 07/07/20 1830  AMMONIA 62* 46*   Coagulation Profile: No results for input(s): INR, PROTIME in the last 168 hours. Cardiac Enzymes: No results for input(s): CKTOTAL, CKMB, CKMBINDEX, TROPONINI in the last 168 hours. BNP (last 3 results) No results for input(s): PROBNP in the last 8760 hours. HbA1C: No results for input(s): HGBA1C in the last 72 hours. CBG: Recent Labs  Lab 07/09/20 1922 07/09/20 2234 07/09/20 2315 07/10/20 0402 07/10/20 0732  GLUCAP 206* 223* 247* 172* 92   Lipid Profile: No results for input(s): CHOL, HDL, LDLCALC, TRIG, CHOLHDL, LDLDIRECT in the last 72 hours. Thyroid Function Tests: Recent Labs    07/08/20 0630  TSH 2.609   Anemia Panel: Recent Labs    07/08/20 0630  VITAMINB12 1,484*   Sepsis Labs: Recent Labs  Lab 07/04/20 1310 07/04/20 1438 07/04/20 1934  07/05/20 0542 07/06/20 0411  PROCALCITON 20.40  --   --  45.61 40.69  LATICACIDVEN  --  3.3* 1.2  --   --     Recent Results (from the past 240 hour(s))  MRSA PCR Screening     Status: None   Collection Time: 07/04/20 10:52 AM   Specimen: Nasal Mucosa; Nasopharyngeal  Result Value Ref Range Status   MRSA by PCR NEGATIVE NEGATIVE Final    Comment:        The GeneXpert MRSA Assay (FDA approved for NASAL specimens only), is one component of a comprehensive MRSA colonization surveillance program. It is not intended to diagnose MRSA infection nor to guide or monitor treatment for MRSA infections. Performed at [facility], [place] [address], [place], [place] [address]   Culture, blood (Routine X 2) w Reflex to ID Panel     Status: None (Preliminary result)   Collection Time: 07/04/20 12:58 PM   Specimen: BLOOD  Result Value Ref Range Status   Specimen Description   Final    BLOOD RIGHT ANTECUBITAL Performed at [facility] [address], [place], [place] [address]    Special Requests   Final    BOTTLES DRAWN AEROBIC AND ANAEROBIC Blood Culture adequate volume Performed at [facility] [address], [place], [place] [address]    Culture   Final    NO GROWTH 4 DAYS Performed at [facility], [place] [address] [place] [address], [place], [place] [address]  Report Status PENDING  Incomplete  Culture, blood (Routine X 2) w Reflex to ID Panel     Status: None (Preliminary result)   Collection Time: 07/04/20  1:09 PM   Specimen: BLOOD  Result Value Ref Range Status   Specimen Description   Final    BLOOD LEFT ANTECUBITAL Performed at [facility] [address] [place], [place] [address]    Special Requests   Final    BOTTLES DRAWN AEROBIC ONLY Blood Culture results may not be optimal due to an inadequate volume of blood received in culture bottles Performed at [place] [address] [place], [place] [address]    Culture   Final    NO GROWTH 4 DAYS Performed at [facility], [place] [address] [place], [place] [address]    Report Status PENDING  Incomplete     Radiology Studies: EEG  Result Date: 07/08/2020 Lora Havens, MD     07/08/2020  1:03 PM Patient Name: Kim Hicks MRN: [id number] Epilepsy Attending: Lora Havens Referring Physician/Provider: Dr. Amie [place] Date: 07/08/2020 Duration: 23.55 mins Patient history: 38 old female with altered mental status, noted to have speech disturbance.  EEG to evaluate for seizures. Level of alertness: Awake, asleep AEDs during EEG study: None Technical aspects: This EEG study was done with scalp electrodes positioned according to the 10-20 International system of electrode placement. Electrical activity was acquired at a sampling rate of 500Hz  and reviewed with a high frequency filter of 70Hz  and a low frequency filter of 1Hz . EEG data were recorded continuously and digitally stored. Description: The posterior dominant rhythm consists of 8 Hz activity of moderate voltage (25-35 uV) seen predominantly in posterior head regions, symmetric and reactive to eye opening and eye closing. Sleep was characterized by sleep spindles (12- 14 Hz), maximal frontocentral region. EEG showed continuous generalized 3 to 6 Hz theta-delta slowing. Generalized periodic discharges with triphasic morphology were noted at 1-1.5hz  which were predominantly seen when patient was awake/stimulated. Hyperventilation and photic stimulation were not performed.   ABNORMALITY -Continuous slow, generalized -Periodic discharges with triphasic morphology, generalized IMPRESSION: This study is suggestive of mild diffuse encephalopathy, nonspecific to etiology but could be secondary to toxic-metabolic causes. No seizures or definite epileptiform discharges were seen throughout the recording. Lora Havens   MR BRAIN WO CONTRAST  Result Date:  07/08/2020 CLINICAL DATA:  Neuro deficit, acute, stroke suspected. EXAM: MRI HEAD WITHOUT CONTRAST TECHNIQUE: Multiplanar, multiecho pulse sequences of the brain and surrounding structures were obtained without intravenous contrast. COMPARISON:  Head CT July 07, 2020. FINDINGS: The study is partially degraded by motion. Brain: No acute infarction, hemorrhage, hydrocephalus, extra-axial collection or mass lesion. Vascular: Normal flow voids. Skull and upper cervical spine: Normal marrow signal. Sinuses/Orbits: Bilateral lens surgery. Paranasal sinuses are clear. IMPRESSION: 1. Motion degraded study. 2. No acute intracranial abnormality identified. Electronically Signed   By: Pedro Earls M.D.   On: 07/08/2020 11:20     LOS: 12 days   Antonieta Pert, MD [facility]  07/10/2020, 8:04 AM

## 2020-07-11 DIAGNOSIS — K9189 Other postprocedural complications and disorders of digestive system: Secondary | ICD-10-CM | POA: Diagnosis not present

## 2020-07-11 DIAGNOSIS — K859 Acute pancreatitis without necrosis or infection, unspecified: Secondary | ICD-10-CM | POA: Diagnosis not present

## 2020-07-11 LAB — COMPREHENSIVE METABOLIC PANEL
ALT: 24 U/L (ref 0–44)
AST: 32 U/L (ref 15–41)
Albumin: 2.7 g/dL — ABNORMAL LOW (ref 3.5–5.0)
Alkaline Phosphatase: 121 U/L (ref 38–126)
Anion gap: 7 (ref 5–15)
BUN: <5 mg/dL — ABNORMAL LOW (ref 6–20)
CO2: 25 mmol/L (ref 22–32)
Calcium: 8 mg/dL — ABNORMAL LOW (ref 8.9–10.3)
Chloride: 113 mmol/L — ABNORMAL HIGH (ref 98–111)
Creatinine, Ser: 0.39 mg/dL — ABNORMAL LOW (ref 0.44–1.00)
GFR, Estimated: >60 mL/min (ref >60)
Glucose, Bld: 123 mg/dL — ABNORMAL HIGH (ref 70–99)
Potassium: 3.1 mmol/L — ABNORMAL LOW (ref 3.5–5.1)
Sodium: 145 mmol/L (ref 135–145)
Total Bilirubin: 0.5 mg/dL (ref 0.3–1.2)
Total Protein: 5.5 g/dL — ABNORMAL LOW (ref 6.5–8.1)

## 2020-07-11 LAB — GLUCOSE, CAPILLARY
Glucose-Capillary: 69 mg/dL — ABNORMAL LOW (ref 70–99)
Glucose-Capillary: 112 mg/dL — ABNORMAL HIGH (ref 70–99)
Glucose-Capillary: 142 mg/dL — ABNORMAL HIGH (ref 70–99)
Glucose-Capillary: 176 mg/dL — ABNORMAL HIGH (ref 70–99)
Glucose-Capillary: 189 mg/dL — ABNORMAL HIGH (ref 70–99)

## 2020-07-11 LAB — CBC
HCT: 27.6 % — ABNORMAL LOW (ref 36.0–46.0)
Hemoglobin: 8.3 g/dL — ABNORMAL LOW (ref 12.0–15.0)
MCH: 24.5 pg — ABNORMAL LOW (ref 26.0–34.0)
MCHC: 30.1 g/dL (ref 30.0–36.0)
MCV: 81.4 fL (ref 80.0–100.0)
Platelets: 182 10*3/uL (ref 150–400)
RBC: 3.39 MIL/uL — ABNORMAL LOW (ref 3.87–5.11)
RDW: 17.2 % — ABNORMAL HIGH (ref 11.5–15.5)
WBC: 5 10*3/uL (ref 4.0–10.5)
nRBC: 0 % (ref 0.0–0.2)

## 2020-07-11 LAB — PHOSPHORUS: Phosphorus: 2.9 mg/dL (ref 2.5–4.6)

## 2020-07-11 LAB — MAGNESIUM: Magnesium: 1.9 mg/dL (ref 1.7–2.4)

## 2020-07-11 MED ORDER — POTASSIUM CHLORIDE CRYS ER 20 MEQ PO TBCR
40.0000 meq | EXTENDED_RELEASE_TABLET | ORAL | Status: AC
  Administered 2020-07-11 (×2): 40 meq via ORAL
  Filled 2020-07-11 (×2): qty 2

## 2020-07-11 MED ORDER — INSULIN DETEMIR 100 UNIT/ML ~~LOC~~ SOLN
5.0000 [IU] | Freq: Every day | SUBCUTANEOUS | Status: DC
  Administered 2020-07-12: 5 [IU] via SUBCUTANEOUS
  Filled 2020-07-11 (×2): qty 0.05

## 2020-07-11 NOTE — Progress Notes (Addendum)
CC:  Abdominal pain  Subjective: She looks good this AM, less fluid in the IR drain, drain was downsized yesterday. Drainage is serous with just a tint of green this AM.    Objective: Vital signs in last 24 hours: Temp:  [98.2 F (36.8 C)-99.9 F (37.7 C)] 98.5 F (36.9 C) (02/17 0400) Pulse Rate:  [87-95] 88 (02/17 0441) Resp:  [9-33] 16 (02/17 0441) BP: (128-154)/(46-68) 135/64 (02/17 0441) SpO2:  [96 %-100 %] 96 % (02/17 0441) Last BM Date: 07/10/20 700 PO 2500 IV ? Some of this is listed as TF, but NG removed yesterday, They are not sure what she got. Urine x 5 BM x 4 Afebrile, VSS K+ 3.1 WBC 5.0 H/H stable 8.3/27.6  Intake/Output from previous day: 02/16 0701 - 02/17 0700 In: 3536.5 [P.O.:700; I.V.:1498.5; NG/GT:800; IV Piggyback:533] Out: 55 [Drains:55] Intake/Output this shift: Total I/O In: 320 [I.V.:315; Other:5] Out: 5 [Drains:5]  General appearance: alert, cooperative and no distress Resp: clear anterior exam GI: soft, normal soreness around the drain, drain is clear serous with just a tint of green.  + BS, +BM  Lab Results:  Recent Labs    07/10/20 0434 07/11/20 0254  WBC 4.1 5.0  HGB 8.5* 8.3*  HCT 27.9* 27.6*  PLT 141* 182    BMET Recent Labs    07/10/20 0434 07/11/20 0254  NA 144 145  K 2.8* 3.1*  CL 114* 113*  CO2 23 25  GLUCOSE 169* 123*  BUN 8 <5*  CREATININE 0.37* 0.39*  CALCIUM 7.7* 8.0*   PT/INR No results for input(s): LABPROT, INR in the last 72 hours.  Recent Labs  Lab 07/06/20 0411 07/07/20 0740 07/09/20 0930 07/10/20 0434 07/11/20 0254  AST 59* 50* 35 30 32  ALT 41 34 27 23 24   ALKPHOS 79 89 122 114 121  BILITOT 0.6 1.1 0.8 0.6 0.5  PROT 5.7* 5.9* 5.8* 5.3* 5.5*  ALBUMIN 3.0* 2.9* 2.9* 2.5* 2.7*     Lipase     Component Value Date/Time   LIPASE 34 07/03/2020 0507     Medications: . bisacodyl  10 mg Rectal Daily  . Chlorhexidine Gluconate Cloth  6 each Topical Daily  . enoxaparin (LOVENOX)  injection  40 mg Subcutaneous Daily  . feeding supplement (GLUCERNA SHAKE)  237 mL Oral BID BM  . feeding supplement (PROSource TF)  45 mL Per Tube TID  . insulin aspart  0-15 Units Subcutaneous TID WC  . insulin detemir  10 Units Subcutaneous BID  . lactulose  20 g Oral BID  . liver oil-zinc oxide   Topical TID  . mouth rinse  15 mL Mouth Rinse BID  . pantoprazole  40 mg Oral QHS  . pregabalin  50 mg Oral TID  . QUEtiapine  200 mg Oral QHS  . sodium chloride flush  10-40 mL Intracatheter Q12H  . sodium chloride flush  5 mL Intracatheter Q8H    Assessment/Plan Altered mental status/concern for aphasia/stroke - stable head CT 07/07/20 >> neurology following>> toxic metabolic encephalopathy  Acute metabolic encephalopathy Post ERCP pancreatitis NASH/Cirrhosis Type 2 diabetes Hypertension Migraines Neuropathy GERD IBS Hypokalemia  - K+ 3.1; Mag 1.9 Anemia   Sepsis 07/04/2020 Persistent bile leak s/p fenestrated laparoscopic cholecystectomy 08/04/2019 Dr. Romana Juniper ERCP/stent placement:08/07/2019, 11/07/2019, 05/13/2020, 07/14/2020 IR drain placement 05/23/2020; DG sinus tract/fistula tube check leak between decompressed gallbladder fossa abscess cavity/cystic duct remnant:  IR drain downsized 07/10/20  FEN:IV fluids/TF on hold/ Clear liquids>> advance to full  liquids ID: Zosyn 2/10-2/16 DVT: Lovenox Follow up: Dr. Kae Heller   Plan:  Advance her diet, mobilize.       LOS: 13 days    Vasilis Luhman 07/11/2020 Please see Amion

## 2020-07-11 NOTE — Progress Notes (Signed)
Referring Physician(s): Saverio Danker ([place])  Supervising Physician: Arne Cleveland  Patient Status:  [facility] - In-pt  Chief Complaint: "Pain at incision"  Subjective:  History of acute cholecystitis s/p cholecystectomy in 08/1658; complicated by persistent cystic duck leak causing biloma despite plastic (occluded) and metal biliary stent placement s/p gallbladder fossa drain placement in IR (for hopes at healing biliary fistula) 05/23/2020; s/p gallbladder fossa drain downsize/exchange in IR 07/10/2020. Patient awake and alert sitting in chair. Husband at bedside. Complains of tenderness at drain insertion site, as expected. Gallbladder fossa drain site c/d/i.   Allergies: Talwin [pentazocine]  Medications: Prior to Admission medications   Medication Sig Start Date End Date Taking? Authorizing Provider  acetaminophen (TYLENOL) 500 MG tablet Take 1,000 mg by mouth every 6 (six) hours as needed for moderate pain.   Yes [provider]  brexpiprazole (REXULTI) 2 MG TABS tablet Take 2 mg by mouth at bedtime.   Yes [provider]  Dulaglutide 4.5 MG/0.5ML SOPN Inject 4.5 mg into the skin every Monday. 12/20/18  Yes [provider]  Empagliflozin-metFORMIN HCl ER (SYNJARDY XR) 25-1000 MG TB24 Take 1 tablet by mouth daily before breakfast.  11/15/18  Yes [provider]  ezetimibe (ZETIA) 10 MG tablet Take 10 mg by mouth daily. 04/25/20  Yes [provider]  Insulin Glargine (BASAGLAR KWIKPEN) 100 UNIT/ML Inject 30 Units into the skin at bedtime. 03/20/20  Yes [provider]  LINZESS 290 MCG CAPS capsule Take 290 mcg by mouth every morning. 08/16/19  Yes [provider]  meloxicam (MOBIC) 15 MG tablet Take 15 mg by mouth daily.   Yes [provider]  metFORMIN (GLUCOPHAGE) 1000 MG tablet Take 1,000 mg by mouth at bedtime. 02/27/20  Yes [provider]  Nutritional Supplements (NUTRITIONAL SHAKE HIGH PROTEIN PO)  Take 237 mLs by mouth 2 (two) times daily.   Yes [provider]  ondansetron (ZOFRAN-ODT) 4 MG disintegrating tablet Take 4 mg by mouth every 8 (eight) hours as needed for nausea or vomiting.   Yes [provider]  prazosin (MINIPRESS) 2 MG capsule Take 4 mg by mouth daily.   Yes [provider]  pregabalin (LYRICA) 100 MG capsule TAKE 1 CAPSULE BY MOUTH THREE TIMES A DAY Patient taking differently: Take 100 mg by mouth 3 (three) times daily. 02/12/20  Yes [name], NP  QUEtiapine (SEROQUEL) 400 MG tablet Take 800 mg by mouth at bedtime.   Yes [provider]  simvastatin (ZOCOR) 10 MG tablet Take 10 mg by mouth daily. 12/13/17  Yes [provider]  topiramate (TOPAMAX) 200 MG tablet Take 200 mg by mouth 2 (two) times daily. 04/12/20  Yes [provider]  vortioxetine HBr ([name]) 20 MG TABS tablet Take 20 mg by mouth at bedtime.   Yes [provider]  ziprasidone ([name]) 20 MG capsule Take 20 mg by mouth at bedtime.   Yes [provider]  amoxicillin-clavulanate (AUGMENTIN) 875-125 MG tablet Take 1 tablet by mouth 2 (two) times daily. Patient taking differently: Take 1 tablet by mouth 2 (two) times daily. Start date : 06/13/20 06/13/20   Prudencio Pair T, MD  lisinopril (ZESTRIL) 20 MG tablet TAKE 1 TABLET BY MOUTH EVERY DAY Patient not taking: No sig reported 02/08/20   [name], MD  pantoprazole (PROTONIX) 40 MG tablet Take 1 tablet (40 mg total) by mouth daily. Patient not taking: No sig reported 06/26/20 06/26/21  Mansouraty, Telford Nab., MD  Vital Signs: BP 135/64 (BP Location: Left Arm)   Pulse 88   Temp 98.5 F (36.9 C) (Oral)   Resp 16   Ht 5' 4"  (1.626 m)   Wt 167 lb 5.3 oz (75.9 kg)   SpO2 96%   BMI 28.72 kg/m   Physical Exam Constitutional:      General: She is not in acute distress.    Appearance: Normal appearance.  Pulmonary:     Effort: Pulmonary effort is normal. No respiratory  distress.  Abdominal:     Comments: Gallbladder fossa drain site witt mild tenderness, no erythema, drainage, or active bleeding; approximately 50 cc clear yellow fluid in gravity bag.  Skin:    General: Skin is warm and dry.  Neurological:     Mental Status: She is alert and oriented to person, place, and time.     Imaging: EEG  Result Date: 07/08/2020 Lora Havens, MD     07/08/2020  1:03 PM Patient Name: Kim Hicks MRN: [id number] Epilepsy Attending: Lora Havens Referring Physician/Provider: Dr. Amie Portland Date: 07/08/2020 Duration: 23.55 mins Patient history: 59 old female with altered mental status, noted to have speech disturbance.  EEG to evaluate for seizures. Level of alertness: Awake, asleep AEDs during EEG study: None Technical aspects: This EEG study was done with scalp electrodes positioned according to the 10-20 International system of electrode placement. Electrical activity was acquired at a sampling rate of 500Hz  and reviewed with a high frequency filter of 70Hz  and a low frequency filter of 1Hz . EEG data were recorded continuously and digitally stored. Description: The posterior dominant rhythm consists of 8 Hz activity of moderate voltage (25-35 uV) seen predominantly in posterior head regions, symmetric and reactive to eye opening and eye closing. Sleep was characterized by sleep spindles (12- 14 Hz), maximal frontocentral region. EEG showed continuous generalized 3 to 6 Hz theta-delta slowing. Generalized periodic discharges with triphasic morphology were noted at 1-1.5hz  which were predominantly seen when patient was awake/stimulated. Hyperventilation and photic stimulation were not performed.   ABNORMALITY -Continuous slow, generalized -Periodic discharges with triphasic morphology, generalized IMPRESSION: This study is suggestive of mild diffuse encephalopathy, nonspecific to etiology but could be secondary to toxic-metabolic causes. No seizures or definite  epileptiform discharges were seen throughout the recording. Lora Havens   CT HEAD WO CONTRAST  Result Date: 07/07/2020 CLINICAL DATA:  Mental status change. EXAM: CT HEAD WITHOUT CONTRAST TECHNIQUE: Contiguous axial images were obtained from the base of the skull through the vertex without intravenous contrast. COMPARISON:  02/16/2020 FINDINGS: Brain: Stable mild age advanced cerebral atrophy. The ventricles are in the midline without mass effect or shift. No extra-axial fluid collections. No CT findings for acute hemispheric infarction or intracranial hemorrhage. No extra-axial fluid collections are identified. Brainstem and cerebellum are grossly normal. Vascular: No aneurysm or hyperdense vessels. Skull: No skull fracture or bone lesions. Sinuses/Orbits: The paranasal sinuses and mastoid air cells are clear. The globes are intact. Other: No scalp lesions or scalp hematoma. IMPRESSION: 1. Stable mild age advanced cerebral atrophy. 2. No acute intracranial findings or mass lesions. Electronically Signed   By: Marijo Sanes M.D.   On: 07/07/2020 15:12   MR BRAIN WO CONTRAST  Result Date: 07/08/2020 CLINICAL DATA:  Neuro deficit, acute, stroke suspected. EXAM: MRI HEAD WITHOUT CONTRAST TECHNIQUE: Multiplanar, multiecho pulse sequences of the brain and surrounding structures were obtained without intravenous contrast. COMPARISON:  Head CT July 07, 2020. FINDINGS: The study is partially degraded by  motion. Brain: No acute infarction, hemorrhage, hydrocephalus, extra-axial collection or mass lesion. Vascular: Normal flow voids. Skull and upper cervical spine: Normal marrow signal. Sinuses/Orbits: Bilateral lens surgery. Paranasal sinuses are clear. IMPRESSION: 1. Motion degraded study. 2. No acute intracranial abnormality identified. Electronically Signed   By: Pedro Earls M.D.   On: 07/08/2020 11:20   DG Chest Port 1 View  Result Date: 07/08/2020 CLINICAL DATA:  Aspiration. EXAM:  PORTABLE CHEST 1 VIEW COMPARISON:  July 05, 2018. FINDINGS: Feeding tube courses below the diaphragm and outside the field of view. Right IJ central venous catheter with the tip projecting at the right atrium, similar to prior. Interval removal of the right PICC. Improved bilateral airspace opacities. No visible pleural effusions or pneumothorax. Similar cardiomediastinal silhouette. IMPRESSION: 1. Improved bilateral airspace opacities. 2. Support devices as above. Electronically Signed   By: Margaretha Sheffield MD   On: 07/08/2020 07:39   IR EXCHANGE BILIARY DRAIN  Result Date: 07/10/2020 INDICATION: 55 year old woman with history of biliary leak status post cholecystectomy treated by placement of 10.2 [place] multipurpose pigtail drain in the gallbladder fossa on 05/20/2020. Patient has greater than 50 mL output from the drain on a daily basis. Surgical service requesting downsizing of drain. EXAM: Gallbladder fossa abscess drain downsize. MEDICATIONS: None ANESTHESIA/SEDATION: None FLUOROSCOPY TIME:  Fluoroscopy Time: 0 minutes 54 seconds (24 mGy). COMPLICATIONS: None immediate. PROCEDURE: Informed written consent was obtained from the patient after a thorough discussion of the procedural risks, benefits and alternatives. All questions were addressed. Maximal Sterile Barrier Technique was utilized including caps, mask, sterile gowns, sterile gloves, sterile drape, hand hygiene and skin antiseptic. A timeout was performed prior to the initiation of the procedure. External segment of the right upper quadrant drain and surrounding skin was prepped and draped in usual fashion. Contrast administered through the drain under fluoroscopy showed a small residual cavity and communication with the cystic duct remnant. The existing 10.2 [place] drain was cut and removed over a guidewire and replaced with a smaller caliber 8.5 [place] multipurpose pigtail drain. Contrast administered through the new drain under fluoroscopy  demonstrated appropriate positioning within the gallbladder fossa. Drain flushed, reattached to bag, and secured to skin with Prolene suture. IMPRESSION: 10.2 French gallbladder fossa drain downsized to 8.5 [place] per surgery service request. Persistent communication with cystic duct seen during abscessogram. Electronically Signed   By: Miachel Roux M.D.   On: 07/10/2020 13:36    Labs:  CBC: Recent Labs    07/08/20 0630 07/09/20 0455 07/10/20 0434 07/11/20 0254  WBC 4.0 3.9* 4.1 5.0  HGB 8.7* 8.8* 8.5* 8.3*  HCT 27.6* 29.3* 27.9* 27.6*  PLT 107* 107* 141* 182    COAGS: Recent Labs    08/05/19 0530 05/20/20 2100 06/27/20 1700  INR 1.2 1.6* 1.0  APTT 30 58*  --     BMP: Recent Labs    08/11/19 0445 11/07/19 1204 02/16/20 0520 05/20/20 1152 07/08/20 0528 07/08/20 0630 07/08/20 1700 07/09/20 0930 07/10/20 0434 07/11/20 0254  NA 142 140 138   < > QUESTIONABLE RESULTS, RECOMMEND RECOLLECT TO VERIFY   < > 140 140 144 145  K 4.0 4.9 3.9   < > QUESTIONABLE RESULTS, RECOMMEND RECOLLECT TO VERIFY   < > 3.2* 3.2* 2.8* 3.1*  CL 104 108 107   < > QUESTIONABLE RESULTS, RECOMMEND RECOLLECT TO VERIFY   < > 112* 112* 114* 113*  CO2 29 20* 17*   < > QUESTIONABLE RESULTS, RECOMMEND RECOLLECT TO VERIFY   < >  20* 20* 23 25  GLUCOSE 221* 321* 339*   < > QUESTIONABLE RESULTS, RECOMMEND RECOLLECT TO VERIFY   < > 142* 227* 169* 123*  BUN 8 31* 39*   < > QUESTIONABLE RESULTS, RECOMMEND RECOLLECT TO VERIFY   < > 9 8 8  <5*  CALCIUM 8.2* 8.4* 8.1*   < > QUESTIONABLE RESULTS, RECOMMEND RECOLLECT TO VERIFY   < > 7.6* 7.8* 7.7* 8.0*  CREATININE 0.40* 0.57 1.08*   < > QUESTIONABLE RESULTS, RECOMMEND RECOLLECT TO VERIFY   < > 0.34* 0.32* 0.37* 0.39*  GFRNONAA >60 >60 58*   < > QUESTIONABLE RESULTS, RECOMMEND RECOLLECT TO VERIFY   < > >60 >60 >60 >60  GFRAA >60 >60 >60  --  QUESTIONABLE RESULTS, RECOMMEND RECOLLECT TO VERIFY  --   --   --   --   --    < > = values in this interval not displayed.     LIVER FUNCTION TESTS: Recent Labs    07/07/20 0740 07/09/20 0930 07/10/20 0434 07/11/20 0254  BILITOT 1.1 0.8 0.6 0.5  AST 50* 35 30 32  ALT 34 27 23 24   ALKPHOS 89 122 114 121  PROT 5.9* 5.8* 5.3* 5.5*  ALBUMIN 2.9* 2.9* 2.5* 2.7*    Assessment and Plan:  History of acute cholecystitis s/p cholecystectomy in 0/6237; complicated by persistent cystic duck leak causing biloma despite plastic (occluded) and metal biliary stent placement s/p gallbladder fossa drain placement in IR (for hopes at healing biliary fistula) 05/23/2020; s/p gallbladder fossa drain downsize/exchange in IR 07/10/2020. Gallbladder fossa drain stable with approximately 50 cc clear yellow fluid in gravity bag (additional 55 cc output from drain in past 24 hours per chart). Continue current drain management- continue with Qshift flushes/monitor of output. Plan for repeat CT/possible drain injection when output <10 cc/day (assess for possible removal). Further plans per [facility]- appreciate and agree with management. IR to follow.   Electronically Signed: Earley Abide, PA-C 07/11/2020, 8:51 AM   I spent a total of 15 Minutes at the the patient's bedside AND on the patient's hospital floor or unit, greater than 50% of which was counseling/coordinating care for gallbladder fossa fluid collection s/p gallbladder fossa drain placement.

## 2020-07-11 NOTE — Progress Notes (Signed)
PROGRESS NOTE    Kim Hicks  BCW:[id number] DOB: [birth year] DOA: 06/27/2020 PCP: [name], FNP   Chief Complaint  Patient presents with  . Post-op Problem  . Abdominal Pain  Brief Narrative: 55 year old female with T2DM, migraine, HLD, HTN, Neuropathy, GERD/IBSNASH & known persistent bile leak after undergoing laparoscopic cholecystectomy 08/04/2019, admitted with post ERCP pancreatitis, being managed conservatively and GI was following closely but she was noted to be obtunded 2/10:Given Narcan,hypotensive,likely aspirated-was transferred to ICU needed pressors for septic shock from aspiration.GI, surg and IR following and also seen by neurology due to concern for slow to resolve encephalopathy. 2/15: Transferred out of ICU to [facility]  Subjective: Up on bedside chair, off NGT Multiple BMs yesterday but slowing down No acute events overnight Per drain exchanged by IR 2/16 k at 3.1 On RA  Assessment & Plan:  Post ERCP pancreatitis, with multiple biliary stent/exchanges for persistent bile leak post cholecystectomy in march 2021, s/p 4 stent 2//22.  Being managed by GI/IR/CCS.  Diet advance as per surgery.  Status post IR drain exchanged 2/16.   Sepsis to aspiration/pneumonia, in the setting of encephalopathy, she completed Zosyn x7 days.  Chest x-ray with resolved infiltrates.  Leukocytosis has resolved.  Persistent acute metabolic encephalopathy: At this time resolved alert awake oriented and at baseline.  Likely multifactorial due to multiple illness, opiates pain medication benzos, component of HE due to cirrhosis, seen by neurology, CT head no acute finding underwent MRI motion degraded no acute stroke, EEG-no epilepsy but suggestive of mild diffuse encephalopathy.  TSH euthyroid, B12 on higher side, ammonia 62>46 .She is on Lyrica 100 mg tid and Seroquel 800 mg qhs and and resumed at lower dose and slowly uptitrate as she tolerates. Cont ptot  Hypokalemia hypophosphatemia,  hypomagnesemia: Potassium is still low and being repleted.  Phosphorus normalized magnesium improved   NASH cirrhosis-lactulose to be continued hold if excessive diarrhea.  Diarrhea due to lactulose, senna 5 ml nightly- will hold.  Continue lactulose as tolerated Constipation resolved but now with diarrhea  Anemia likely from chronic disease and had partial PRBC 2/11 hemoglobin holding 8 to 9 g.  Recent Labs  Lab 07/07/20 0740 07/08/20 0630 07/09/20 0455 07/10/20 0434 07/11/20 0254  HGB 9.0* 8.7* 8.8* 8.5* 8.3*  HCT 28.6* 27.6* 29.3* 27.9* 27.6*   Insulin dependent type 2 diabetes mellitus, hba1c uncontrolled 8.92/3.hypoglycemia 69 overnight.  Cut down Lantus 10 u bid> 5 U daily and cont qachs ssi.  Diet is being advanced.  Off tube feeding Recent Labs  Lab 07/10/20 0732 07/10/20 1116 07/10/20 1634 07/10/20 2116 07/11/20 0750  GLUCAP 92 148* 219* 144* 70*   Nutrition: Diet Order            Diet full liquid Room service appropriate? Yes; Fluid consistency: Thin  Diet effective now                 Nutrition Problem: Inadequate oral intake Etiology: poor appetite Signs/Symptoms: per patient/family report Interventions: Tube feeding Pt's Body mass index is 28.72 kg/m.  Pressure Ulcer: Pressure Injury 07/04/20 Sacrum Mid Stage 1 -  Intact skin with non-blanchable redness of a localized area usually over a bony prominence. (Active)  07/04/20 1406  Location: Sacrum  Location Orientation: Mid  Staging: Stage 1 -  Intact skin with non-blanchable redness of a localized area usually over a bony prominence.  Wound Description (Comments):   Present on Admission:     DVT prophylaxis: enoxaparin (LOVENOX) injection 40 mg Start: 06/30/20  1400 SCDs Start: 06/27/20 2058 Code Status:   Code Status: Full Code  Family Communication: plan of care discussed with patient at bedside. Discussed with the husband at the bedside 2/15-no family at bedside this morning. Discussed with  CCS  Status is: Inpatient Remains inpatient appropriate because:Inpatient level of care appropriate due to severity of illness and For ongoing management of her sepsis, deconditioning, poor p.o., abdominal pain and biliary drainage  Dispo:  Patient From: Home  Planned Disposition: To be determined  Expected discharge date:2-3 days  Medically stable for discharge: No    Consultants:see note  Procedures:see note  Unresulted Labs (From admission, onward)          Start     Ordered   07/10/20 0500  Comprehensive metabolic panel  Daily,   R     Question:  Specimen collection method  Answer:  Unit=Unit collect   07/09/20 0821   07/10/20 0500  CBC  Daily,   R     Question:  Specimen collection method  Answer:  Unit=Unit collect   07/09/20 9622          Culture/Microbiology    Component Value Date/Time   SDES  07/04/2020 1309    BLOOD LEFT ANTECUBITAL Performed at [facility], [place] [address] [place], [place] [address]    [place]  07/04/2020 1309    BOTTLES DRAWN AEROBIC ONLY Blood Culture results may not be optimal due to an inadequate volume of blood received in culture bottles Performed at [facility], [place] [address], [place], [place] [address]    CULT  07/04/2020 1309    NO GROWTH 6 DAYS Performed at [place] [address] [place], [place] [address]    REPTSTATUS 07/10/2020 FINAL 07/04/2020 1309    Other culture-see note  Medications: Scheduled Meds: . bisacodyl  10 mg Rectal Daily  . Chlorhexidine Gluconate Cloth  6 each Topical Daily  . enoxaparin (LOVENOX) injection  40 mg Subcutaneous Daily  . feeding supplement (GLUCERNA SHAKE)  237 mL Oral BID BM  . feeding supplement (PROSource TF)  45 mL Per Tube TID  . insulin aspart  0-15 Units Subcutaneous TID WC  . insulin detemir  10 Units Subcutaneous BID  . lactulose  20 g Oral BID  . liver oil-zinc oxide   Topical TID  . mouth rinse  15 mL Mouth Rinse BID   . pantoprazole  40 mg Oral QHS  . pregabalin  50 mg Oral TID  . QUEtiapine  200 mg Oral QHS  . sodium chloride flush  10-40 mL Intracatheter Q12H  . sodium chloride flush  5 mL Intracatheter Q8H   Continuous Infusions: . sodium chloride Stopped (07/08/20 1031)  . feeding supplement (JEVITY 1.5 CAL/FIBER) Stopped (07/10/20 0005)    Antimicrobials: Anti-infectives (From admission, onward)   Start     Dose/Rate Route Frequency Ordered Stop   07/04/20 1330  vancomycin (VANCOREADY) IVPB 1500 mg/300 mL  Status:  Discontinued        1,500 mg 150 mL/hr over 120 Minutes Intravenous  Once 07/04/20 1234 07/04/20 1254   07/04/20 1330  vancomycin (VANCOREADY) IVPB 1750 mg/350 mL  Status:  Discontinued        1,750 mg 175 mL/hr over 120 Minutes Intravenous Every 24 hours 07/04/20 1254 07/05/20 0821   07/04/20 1245  piperacillin-tazobactam (ZOSYN) IVPB 3.375 g        3.375 g 12.5 mL/hr over 240 Minutes Intravenous Every 8 hours 07/04/20  1233 07/10/20 2359   06/27/20 2100  ciprofloxacin (CIPRO) IVPB 400 mg        400 mg 200 mL/hr over 60 Minutes Intravenous Every 12 hours 06/27/20 2057 07/02/20 0944     Objective: Vitals: Today's Vitals   07/10/20 2345 07/10/20 2346 07/11/20 0400 07/11/20 0441  BP:    135/64  Pulse:    88  Resp:    16  Temp:  98.2 F (36.8 C) 98.5 F (36.9 C)   TempSrc:  Oral Oral   SpO2:    96%  Weight:      Height:      PainSc: 0-No pain       Intake/Output Summary (Last 24 hours) at 07/11/2020 0812 Last data filed at 07/11/2020 0500 Gross per 24 hour  Intake 2728.51 ml  Output 55 ml  Net 2673.51 ml   Filed Weights   07/07/20 0500 07/08/20 0500 07/10/20 0500  Weight: 77.3 kg 74.5 kg 75.9 kg   Weight change:   Intake/Output from previous day: 02/16 0701 - 02/17 0700 In: 3536.5 [P.O.:700; I.V.:1498.5; NG/GT:800; IV Piggyback:533] Out: 55 [Drains:55] Intake/Output this shift: No intake/output data recorded. Filed Weights   07/07/20 0500 07/08/20 0500  07/10/20 0500  Weight: 77.3 kg 74.5 kg 75.9 kg    Examination: General exam: AAOX3, OLD FOR AGE, PLEASANT,NAD, weak appearing. HEENT:Oral mucosa moist, Ear/Nose WNL grossly, dentition normal. Respiratory system: bilaterally clear,no wheezing or crackles,no use of accessory muscle Cardiovascular system: S1 & S2 +, No JVD,. Gastrointestinal system: Abdomen soft, RUQ PER DRAIN+ WITH BILE, NT,ND, BS+ Nervous System:Alert, awake, moving extremities and grossly nonfocal Extremities: No edema, distal peripheral pulses palpable.  Skin: No rashes,no icterus. MSK: Normal muscle bulk,tone, power  Data Reviewed: I have personally reviewed following labs and imaging studies CBC: Recent Labs  Lab 07/07/20 0740 07/08/20 0630 07/09/20 0455 07/10/20 0434 07/11/20 0254  WBC 6.1 4.0 3.9* 4.1 5.0  HGB 9.0* 8.7* 8.8* 8.5* 8.3*  HCT 28.6* 27.6* 29.3* 27.9* 27.6*  MCV 77.3* 78.2* 80.9 80.4 81.4  PLT 133* 107* 107* 141* 539   Basic Metabolic Panel: Recent Labs  Lab 07/06/20 2044 07/06/20 2207 07/07/20 1631 07/08/20 0528 07/08/20 0630 07/08/20 1700 07/09/20 0930 07/10/20 0434 07/11/20 0254  NA 140   < > 141   < > 142 140 140 144 145  K 2.3*   < > 2.7*   < > 3.1* 3.2* 3.2* 2.8* 3.1*  CL 116*   < > 115*   < > 112* 112* 112* 114* 113*  CO2 15*   < > 20*   < > 21* 20* 20* 23 25  GLUCOSE 570*   < > 127*   < > 114* 142* 227* 169* 123*  BUN 21*   < > 15   < > 12 9 8 8  <5*  CREATININE 0.65   < > 0.37*   < > 0.35* 0.34* 0.32* 0.37* 0.39*  CALCIUM 6.7*   < > 6.9*   < > 7.8* 7.6* 7.8* 7.7* 8.0*  MG 2.0  --  1.8  --   --   --  1.7 1.7 1.9  PHOS  --   --   --   --  <1.0*  --   --  2.0* 2.9   < > = values in this interval not displayed.   GFR: Estimated Creatinine Clearance: 79.3 mL/min (A) (by C-G formula based on SCr of 0.39 mg/dL (L)). Liver Function Tests: Recent Labs  Lab 07/06/20 0411  07/07/20 0740 07/09/20 0930 07/10/20 0434 07/11/20 0254  AST 59* 50* 35 30 32  ALT 41 34 27 23 24    ALKPHOS 79 89 122 114 121  BILITOT 0.6 1.1 0.8 0.6 0.5  PROT 5.7* 5.9* 5.8* 5.3* 5.5*  ALBUMIN 3.0* 2.9* 2.9* 2.5* 2.7*   No results for input(s): LIPASE, AMYLASE in the last 168 hours. Recent Labs  Lab 07/06/20 2043 07/07/20 1830  AMMONIA 62* 46*   Coagulation Profile: No results for input(s): INR, PROTIME in the last 168 hours. Cardiac Enzymes: No results for input(s): CKTOTAL, CKMB, CKMBINDEX, TROPONINI in the last 168 hours. BNP (last 3 results) No results for input(s): PROBNP in the last 8760 hours. HbA1C: No results for input(s): HGBA1C in the last 72 hours. CBG: Recent Labs  Lab 07/10/20 0732 07/10/20 1116 07/10/20 1634 07/10/20 2116 07/11/20 0750  GLUCAP 92 148* 219* 144* 69*   Lipid Profile: No results for input(s): CHOL, HDL, LDLCALC, TRIG, CHOLHDL, LDLDIRECT in the last 72 hours. Thyroid Function Tests: No results for input(s): TSH, T4TOTAL, FREET4, T3FREE, THYROIDAB in the last 72 hours. Anemia Panel: No results for input(s): VITAMINB12, FOLATE, FERRITIN, TIBC, IRON, RETICCTPCT in the last 72 hours. Sepsis Labs: Recent Labs  Lab 07/04/20 1310 07/04/20 1438 07/04/20 1934 07/05/20 0542 07/06/20 0411  PROCALCITON 20.40  --   --  45.61 40.69  LATICACIDVEN  --  3.3* 1.2  --   --     Recent Results (from the past 240 hour(s))  MRSA PCR Screening     Status: None   Collection Time: 07/04/20 10:52 AM   Specimen: Nasal Mucosa; Nasopharyngeal  Result Value Ref Range Status   MRSA by PCR NEGATIVE NEGATIVE Final    Comment:        The GeneXpert MRSA Assay (FDA approved for NASAL specimens only), is one component of a comprehensive MRSA colonization surveillance program. It is not intended to diagnose MRSA infection nor to guide or monitor treatment for MRSA infections. Performed at [facility], [place] [address], [place], [place] [address]   Culture, blood (Routine X 2) w Reflex to ID Panel     Status: None   Collection Time:  07/04/20 12:58 PM   Specimen: BLOOD  Result Value Ref Range Status   Specimen Description   Final    BLOOD RIGHT ANTECUBITAL Performed at [facility] [address], [place], [place] [address]    Special Requests   Final    BOTTLES DRAWN AEROBIC AND ANAEROBIC Blood Culture adequate volume Performed at [place] [address], [place], [place] [address]    Culture   Final    NO GROWTH 6 DAYS Performed at [facility], [place] [address] [place], [place] [address]    Report Status 07/10/2020 FINAL  Final  Culture, blood (Routine X 2) w Reflex to ID Panel     Status: None   Collection Time: 07/04/20  1:09 PM   Specimen: BLOOD  Result Value Ref Range Status   Specimen Description   Final    BLOOD LEFT ANTECUBITAL Performed at [place] [address] [place], [place] [address]    Special Requests   Final    BOTTLES DRAWN AEROBIC ONLY Blood Culture results may not be optimal due to an inadequate volume of blood received in culture bottles Performed at [place] [address] [place], [place] [address]    Culture   Final  NO GROWTH 6 DAYS Performed at [facility], [place] [address], [place], [place] [address]    Report Status 07/10/2020 FINAL  Final     Radiology Studies: IR EXCHANGE BILIARY DRAIN  Result Date: 07/10/2020 INDICATION: 55 year old woman with history of biliary leak status post cholecystectomy treated by placement of 10.2 [place] multipurpose pigtail drain in the gallbladder fossa on 05/20/2020. Patient has greater than 50 mL output from the drain on a daily basis. Surgical service requesting downsizing of drain. EXAM: Gallbladder fossa abscess drain downsize. MEDICATIONS: None ANESTHESIA/SEDATION: None FLUOROSCOPY TIME:  Fluoroscopy Time: 0 minutes 54 seconds (24 mGy). COMPLICATIONS: None immediate. PROCEDURE: Informed written consent was obtained from the patient after  a thorough discussion of the procedural risks, benefits and alternatives. All questions were addressed. Maximal Sterile Barrier Technique was utilized including caps, mask, sterile gowns, sterile gloves, sterile drape, hand hygiene and skin antiseptic. A timeout was performed prior to the initiation of the procedure. External segment of the right upper quadrant drain and surrounding skin was prepped and draped in usual fashion. Contrast administered through the drain under fluoroscopy showed a small residual cavity and communication with the cystic duct remnant. The existing 10.2 [place] drain was cut and removed over a guidewire and replaced with a smaller caliber 8.5 [place] multipurpose pigtail drain. Contrast administered through the new drain under fluoroscopy demonstrated appropriate positioning within the gallbladder fossa. Drain flushed, reattached to bag, and secured to skin with Prolene suture. IMPRESSION: 10.2 French gallbladder fossa drain downsized to 8.5 [place] per surgery service request. Persistent communication with cystic duct seen during abscessogram. Electronically Signed   By: Miachel Roux M.D.   On: 07/10/2020 13:36     LOS: 13 days   Antonieta Pert, MD [facility]  07/11/2020, 8:12 AM

## 2020-07-11 NOTE — Progress Notes (Addendum)
  Hypoglycemic Event  CBG: 69 Treatment: 4 oz orange juice given  Symptoms:  none  Follow-up CBG: 0840 Time: CBG Result:112  Possible Reasons for Event: Not enough food eaten  Comments/MD notified: yes. MD says to hold levemir today & give 5 units tomorrow instead.     Lynnea Maizes Garcia-Contreras

## 2020-07-12 DIAGNOSIS — R1011 Right upper quadrant pain: Secondary | ICD-10-CM | POA: Diagnosis not present

## 2020-07-12 LAB — COMPREHENSIVE METABOLIC PANEL
ALT: 23 U/L (ref 0–44)
AST: 30 U/L (ref 15–41)
Albumin: 2.6 g/dL — ABNORMAL LOW (ref 3.5–5.0)
Alkaline Phosphatase: 138 U/L — ABNORMAL HIGH (ref 38–126)
Anion gap: 7 (ref 5–15)
BUN: 6 mg/dL (ref 6–20)
CO2: 24 mmol/L (ref 22–32)
Calcium: 8 mg/dL — ABNORMAL LOW (ref 8.9–10.3)
Chloride: 112 mmol/L — ABNORMAL HIGH (ref 98–111)
Creatinine, Ser: 0.35 mg/dL — ABNORMAL LOW (ref 0.44–1.00)
GFR, Estimated: >60 mL/min (ref >60)
Glucose, Bld: 137 mg/dL — ABNORMAL HIGH (ref 70–99)
Potassium: 3.8 mmol/L (ref 3.5–5.1)
Sodium: 143 mmol/L (ref 135–145)
Total Bilirubin: 0.5 mg/dL (ref 0.3–1.2)
Total Protein: 5.5 g/dL — ABNORMAL LOW (ref 6.5–8.1)

## 2020-07-12 LAB — CBC
HCT: 27.1 % — ABNORMAL LOW (ref 36.0–46.0)
Hemoglobin: 8.1 g/dL — ABNORMAL LOW (ref 12.0–15.0)
MCH: 24.8 pg — ABNORMAL LOW (ref 26.0–34.0)
MCHC: 29.9 g/dL — ABNORMAL LOW (ref 30.0–36.0)
MCV: 83.1 fL (ref 80.0–100.0)
Platelets: 210 10*3/uL (ref 150–400)
RBC: 3.26 MIL/uL — ABNORMAL LOW (ref 3.87–5.11)
RDW: 17.9 % — ABNORMAL HIGH (ref 11.5–15.5)
WBC: 4.9 10*3/uL (ref 4.0–10.5)
nRBC: 0 % (ref 0.0–0.2)

## 2020-07-12 LAB — GLUCOSE, CAPILLARY
Glucose-Capillary: 123 mg/dL — ABNORMAL HIGH (ref 70–99)
Glucose-Capillary: 183 mg/dL — ABNORMAL HIGH (ref 70–99)
Glucose-Capillary: 255 mg/dL — ABNORMAL HIGH (ref 70–99)
Glucose-Capillary: 173 mg/dL — ABNORMAL HIGH (ref 70–99)

## 2020-07-12 MED ORDER — POLYVINYL ALCOHOL 1.4 % OP SOLN
1.0000 [drp] | OPHTHALMIC | Status: DC | PRN
  Administered 2020-07-12: 1 [drp] via OPHTHALMIC
  Filled 2020-07-12: qty 15

## 2020-07-12 MED ORDER — LACTULOSE 10 GM/15ML PO SOLN: 10.0000 g | Freq: Every day | ORAL | Status: DC

## 2020-07-12 NOTE — Progress Notes (Signed)
Progress Note     Subjective: Patient reports some R sided abdominal pain right at drain site today. She denies nausea and is now tolerating a regular diet. She is having some diarrhea still.   Objective: Vital signs in last 24 hours: Temp:  [98 F (36.7 C)-98.7 F (37.1 C)] 98.3 F (36.8 C) (02/18 0812) Pulse Rate:  [82-101] 84 (02/18 0600) Resp:  [0-27] 27 (02/18 0600) SpO2:  [96 %-100 %] 96 % (02/18 0600) Weight:  [75 kg] 75 kg (02/18 0500) Last BM Date: 07/11/20  Intake/Output from previous day: 02/17 0701 - 02/18 0700 In: 280 [P.O.:240; I.V.:40] Out: 950 [Urine:850; Drains:100] Intake/Output this shift: No intake/output data recorded.  PE: General: pleasant, WD, WN female who is sitting up in NAD HEENT: Sclera are anicteric.  Heart: regular, rate, and rhythm.   Lungs: CTAB, no wheezes, rhonchi, or rales noted.  Respiratory effort nonlabored Abd: soft, mildly ttp in RUQ, ND, +BS, biliary drain with thin clear bilious fluid MS: all 4 extremities are symmetrical with no cyanosis, clubbing, or edema. Skin: warm and dry with no masses, lesions, or rashes Neuro: Cranial nerves 2-12 grossly intact, sensation is normal throughout Psych: A&Ox3 with an appropriate affect.    Lab Results:  Recent Labs    07/11/20 0254 07/12/20 0243  WBC 5.0 4.9  HGB 8.3* 8.1*  HCT 27.6* 27.1*  PLT 182 210   BMET Recent Labs    07/11/20 0254 07/12/20 0243  NA 145 143  K 3.1* 3.8  CL 113* 112*  CO2 25 24  GLUCOSE 123* 137*  BUN <5* 6  CREATININE 0.39* 0.35*  CALCIUM 8.0* 8.0*   PT/INR No results for input(s): LABPROT, INR in the last 72 hours. CMP     Component Value Date/Time   NA 143 07/12/2020 0243   NA 137 12/14/2012 1255   K 3.8 07/12/2020 0243   K 4.0 12/14/2012 1255   CL 112 (H) 07/12/2020 0243   CL 104 12/14/2012 1255   CO2 24 07/12/2020 0243   CO2 27 12/14/2012 1255   GLUCOSE 137 (H) 07/12/2020 0243   GLUCOSE 233 (H) 12/14/2012 1255   BUN 6 07/12/2020  0243   BUN 17 12/14/2012 1255   CREATININE 0.35 (L) 07/12/2020 0243   CREATININE 0.46 (L) 12/14/2012 1255   CALCIUM 8.0 (L) 07/12/2020 0243   CALCIUM 9.3 12/14/2012 1255   PROT 5.5 (L) 07/12/2020 0243   PROT 7.0 05/16/2019 1154   ALBUMIN 2.6 (L) 07/12/2020 0243   AST 30 07/12/2020 0243   ALT 23 07/12/2020 0243   ALKPHOS 138 (H) 07/12/2020 0243   BILITOT 0.5 07/12/2020 0243   GFRNONAA >60 07/12/2020 0243   GFRNONAA >60 12/14/2012 1255   GFRAA  07/08/2020 0528    QUESTIONABLE RESULTS, RECOMMEND RECOLLECT TO VERIFY   GFRAA >60 12/14/2012 1255   Lipase     Component Value Date/Time   LIPASE 34 07/03/2020 0507       Studies/Results: IR EXCHANGE BILIARY DRAIN  Result Date: 07/10/2020 INDICATION: 55 year old woman with history of biliary leak status post cholecystectomy treated by placement of 10.2 [place] multipurpose pigtail drain in the gallbladder fossa on 05/20/2020. Patient has greater than 50 mL output from the drain on a daily basis. Surgical service requesting downsizing of drain. EXAM: Gallbladder fossa abscess drain downsize. MEDICATIONS: None ANESTHESIA/SEDATION: None FLUOROSCOPY TIME:  Fluoroscopy Time: 0 minutes 54 seconds (24 mGy). COMPLICATIONS: None immediate. PROCEDURE: Informed written consent was obtained from the patient after a thorough  discussion of the procedural risks, benefits and alternatives. All questions were addressed. Maximal Sterile Barrier Technique was utilized including caps, mask, sterile gowns, sterile gloves, sterile drape, hand hygiene and skin antiseptic. A timeout was performed prior to the initiation of the procedure. External segment of the right upper quadrant drain and surrounding skin was prepped and draped in usual fashion. Contrast administered through the drain under fluoroscopy showed a small residual cavity and communication with the cystic duct remnant. The existing 10.2 [place] drain was cut and removed over a guidewire and replaced with a  smaller caliber 8.5 [place] multipurpose pigtail drain. Contrast administered through the new drain under fluoroscopy demonstrated appropriate positioning within the gallbladder fossa. Drain flushed, reattached to bag, and secured to skin with Prolene suture. IMPRESSION: 10.2 French gallbladder fossa drain downsized to 8.5 [place] per surgery service request. Persistent communication with cystic duct seen during abscessogram. Electronically Signed   By: Miachel Roux M.D.   On: 07/10/2020 13:36    Anti-infectives: Anti-infectives (From admission, onward)   Start     Dose/Rate Route Frequency Ordered Stop   07/04/20 1330  vancomycin (VANCOREADY) IVPB 1500 mg/300 mL  Status:  Discontinued        1,500 mg 150 mL/hr over 120 Minutes Intravenous  Once 07/04/20 1234 07/04/20 1254   07/04/20 1330  vancomycin (VANCOREADY) IVPB 1750 mg/350 mL  Status:  Discontinued        1,750 mg 175 mL/hr over 120 Minutes Intravenous Every 24 hours 07/04/20 1254 07/05/20 0821   07/04/20 1245  piperacillin-tazobactam (ZOSYN) IVPB 3.375 g        3.375 g 12.5 mL/hr over 240 Minutes Intravenous Every 8 hours 07/04/20 1233 07/10/20 2359   06/27/20 2100  ciprofloxacin (CIPRO) IVPB 400 mg        400 mg 200 mL/hr over 60 Minutes Intravenous Every 12 hours 06/27/20 2057 07/02/20 0944       Assessment/Plan Altered mental status/concern for aphasia/stroke - stable head CT 07/07/20 >> neurology following>> toxic metabolic encephalopathy Acute metabolic encephalopathy Post ERCP pancreatitis NASH/Cirrhosis Type 2 diabetes Hypertension Migraines Neuropathy GERD IBS Hypokalemia - K+ 3.8, improved Anemia   Sepsis 07/04/2020 Persistent bile leak s/p fenestrated laparoscopic cholecystectomy 08/04/2019 Dr. Romana Juniper ERCP/stent placement:08/07/2019, 11/07/2019, 05/13/2020, 07/14/2020 IR drain placement 05/23/2020; DG sinus tract/fistula tube check leak between decompressed gallbladder fossa abscess cavity/cystic duct  remnant:  IR drain downsized 07/10/20 - Tbili normal, AST/ALT normal, Alk phos mildly elevated  - tolerating reg diet - nothing acutely to do from a surgical standpoint - we will follow peripherally at this point - will work on ensuring patient has follow in our office   FEN: reg diet  ID: Zosyn 2/10-2/16 DVT: Lovenox Follow up: Dr. Kae Heller vs [facility] surgeon    LOS: 14 days    Norm Parcel , [facility] Surgery 07/12/2020, 10:02 AM Please see Amion for pager number during day hours 7:00am-4:30pm

## 2020-07-12 NOTE — Evaluation (Signed)
Occupational Therapy Evaluation Patient Details Name: Kim Hicks MRN: [id number] DOB: 07-02-1965 Today's Date: 07/12/2020    History of Present Illness 55 year old female with T2DM, migraine, HLD, HTN, Neuropathy, GERD/IBSNASH & known persistent bile leak after undergoing laparoscopic cholecystectomy 08/04/2019, admitted with post ERCP pancreatitis. Transferred to ICU needing pressors for septic shock from aspiration.GI, surg and IR following and also seen by neurology due to concern for slow to resolve encephalopathy.  2/15: Transferred out of ICU to [facility]  2/16: Drain exchanged by IR, diet is being advanced.   Clinical Impression   Kim Hicks is a 55 year old woman who presents with generalized weakness of upper extremities, reports her arms have been getting weaker and exhibits some atrophy in thumb we space, but able to perform all ADLs without difficulty. Patient demonstrates ability to ambulate in room without DME and without loss of balance and perform 5 sit to stands with significant fatigue. She did have to use her arms to assist with rising. Patient has assistance of husband and all DME needed at home if needed. No OT needs at this time.    Follow Up Recommendations  No OT follow up    Equipment Recommendations  None recommended by OT    Recommendations for Other Services       Precautions / Restrictions Precautions Precautions: None Restrictions Weight Bearing Restrictions: No      Mobility Bed Mobility Overal bed mobility: Modified Independent                  Transfers Overall transfer level: Modified independent                    Balance Overall balance assessment: No apparent balance deficits (not formally assessed)                                         ADL either performed or assessed with clinical judgement   ADL Overall ADL's : At baseline                                             Vision  Patient Visual Report: No change from baseline       Perception     Praxis      Pertinent Vitals/Pain Pain Assessment: No/denies pain     Hand Dominance Right   Extremity/Trunk Assessment Upper Extremity Assessment Upper Extremity Assessment: Generalized weakness   Lower Extremity Assessment Lower Extremity Assessment: Defer to PT evaluation   Cervical / Trunk Assessment Cervical / Trunk Assessment: Normal   Communication Communication Communication: No difficulties   Cognition Arousal/Alertness: Awake/alert Behavior During Therapy: WFL for tasks assessed/performed Overall Cognitive Status: Within Functional Limits for tasks assessed                                     General Comments       Exercises     Shoulder Instructions      Home Living Family/patient expects to be discharged to:: Private residence Living Arrangements: Spouse/significant other;Children Available Help at Discharge: Family;Available 24 hours/day Type of Home: House Home Access: Stairs to enter [employer] of Steps: 6 Entrance Stairs-Rails: Right;Left;Can reach both  Home Layout: One level     Bathroom Shower/Tub: Tub/shower unit;Walk-in shower   Bathroom Toilet: Handicapped height     Home Equipment: Environmental consultant - 2 wheels;Cane - quad;Crutches;Walker - 4 wheels;Bedside commode;Toilet riser          Prior Functioning/Environment Level of Independence: Independent                 OT Problem List: Decreased strength      OT Treatment/Interventions:      OT Goals(Current goals can be found in the care plan section) Acute Rehab OT Goals OT Goal Formulation: All assessment and education complete, DC therapy  OT Frequency:     Barriers to D/C:            Co-evaluation              AM-PAC OT "6 Clicks" Daily Activity     Outcome Measure Help from another person eating meals?: None Help from another person taking care of personal grooming?:  None Help from another person toileting, which includes using toliet, bedpan, or urinal?: None Help from another person bathing (including washing, rinsing, drying)?: None Help from another person to put on and taking off regular upper body clothing?: None Help from another person to put on and taking off regular lower body clothing?: None 6 Click Score: 24   End of Session Nurse Communication: Mobility status  Activity Tolerance: Patient tolerated treatment well Patient left: in bed;with call bell/phone within reach;with family/visitor present  OT Visit Diagnosis: Muscle weakness (generalized) (M62.81)                Time: 1610-9604 OT Time Calculation (min): 8 min Charges:  OT General Charges $OT Visit: 1 Visit  Derl Barrow, OTR/L [place]  Office [phone] Pager: [phone]   Lenward Chancellor 07/12/2020, 3:55 PM

## 2020-07-12 NOTE — Progress Notes (Signed)
PROGRESS NOTE    Kim Hicks  UMP:[id number] DOB: February 26, 1966 DOA: 06/27/2020 PCP: [name], FNP   Chief Complaint  Patient presents with  . Post-op Problem  . Abdominal Pain  Brief Narrative: 55 year old female with T2DM, migraine, HLD, HTN, Neuropathy, GERD/IBSNASH & known persistent bile leak after undergoing laparoscopic cholecystectomy 08/04/2019, admitted with post ERCP pancreatitis, being managed conservatively and GI was following closely but she was noted to be obtunded 2/10:Given Narcan,hypotensive,likely aspirated-was transferred to ICU needed pressors for septic shock from aspiration.GI, surg and IR following and also seen by neurology due to concern for slow to resolve encephalopathy. 2/15: Transferred out of ICU to [facility] 2/16: Drain exchanged by IR, diet is being advanced  Subjective:  Afebrile overnight. Blood work stable electrolytes Mentation has improved to baseline Drain output 100 cc No new complaints  c/o multiple BM  At least 20 yesterday and has not taken lactulose yesterday also.  Assessment & Plan:  Post ERCP pancreatitis, with multiple biliary stent/exchanges for persistent bile leak post cholecystectomy in march 2021, failed plastic stent then metal stent migration and then again plastic stenting. Being managed by GI/IR/CCS. GI signed off. Status post IR drain exchanged 2/16. IR following, continue with every shift flushes, monitor output and once diuretic intensity per day repeat CT/significant injection for possible drain removal.  Sepsis to aspiration/pneumonia, in the setting of encephalopathy. Completed Zosyn. Respiratory status stable no leukocytosis or fever.  Persistent acute metabolic encephalopathy: This has resolved for few days now.Alert awake oriented x3 Likely multifactorial due to multiple illness, opiates pain medication benzos, component of HE due to cirrhosis, seen by neurology, CT head no acute finding underwent MRI motion degraded no acute  stroke, EEG-no epilepsy but suggestive of mild diffuse encephalopathy.  TSH euthyroid, B12 on higher side, ammonia 62>46 .She is on Lyrica 100 mg tid and Seroquel 800 mg qhs and and resumed at lower dose and slowly uptitrate as she tolerates. Continue supportive measures  Hypokalemia hypophosphatemia, hypomagnesemia: Electrolytes have normalized.   NASH cirrhosis-lactulose to be continued hold if excessive diarrhea.  Diarrhea due to lactulose, senna 5 ml nightly- will hold.  Continue lactulose as tolerated Constipation resolved.   Anemia likely from chronic disease and had partial PRBC 2/11 hemoglobin holding 8 to 9 g. Monitor and transfuse if less than 7 g Recent Labs  Lab 07/08/20 0630 07/09/20 0455 07/10/20 0434 07/11/20 0254 07/12/20 0243  HGB 8.7* 8.8* 8.5* 8.3* 8.1*  HCT 27.6* 29.3* 27.9* 27.6* 27.1*   Insulin dependent type 2 diabetes mellitus, hba1c uncontrolled 8.9 on 2/3.hypoglycemia 69 2/17. Reduced  Lantus 10 u bid> 5 U daily 2/17- cont qachs , diet . off tube feeding Recent Labs  Lab 07/11/20 0840 07/11/20 1149 07/11/20 1632 07/11/20 2105 07/12/20 0735  GLUCAP 112* 142* 176* 189* 123*   Nutrition: Diet Order            Diet regular Room service appropriate? Yes; Fluid consistency: Thin  Diet effective now                 Nutrition Problem: Inadequate oral intake Etiology: poor appetite Signs/Symptoms: per patient/family report Interventions: Tube feeding Pt's Body mass index is 28.38 kg/m.  Pressure Ulcer: Pressure Injury 07/04/20 Sacrum Mid Stage 1 -  Intact skin with non-blanchable redness of a localized area usually over a bony prominence. (Active)  07/04/20 1406  Location: Sacrum  Location Orientation: Mid  Staging: Stage 1 -  Intact skin with non-blanchable redness of a localized area usually  over a bony prominence.  Wound Description (Comments):   Present on Admission:     DVT prophylaxis: enoxaparin (LOVENOX) injection 40 mg Start: 06/30/20  1400 SCDs Start: 06/27/20 2058 Code Status:   Code Status: Full Code  Family Communication: plan of care discussed with patient at bedside. Discussed with the husband at the bedside 2/15-no family at bedside this morning. Discussed with CCS  Status is: Inpatient Remains inpatient appropriate because:Inpatient level of care appropriate due to severity of illness and For ongoing management of her sepsis, deconditioning, poor p.o., abdominal pain and biliary drainage  Dispo:  Patient From: Home  Planned Disposition: To be determined obtain PT OT evaluation, transfer out of step down.  Expected discharge date:1-2 days  Medically stable for discharge: No    Consultants:see note  Procedures:see note  Unresulted Labs (From admission, onward)          Start     Ordered   07/13/20 0500  Comprehensive metabolic panel  Daily,   R     Question:  Specimen collection method  Answer:  Lab=Lab collect   07/12/20 0756   07/13/20 0500  CBC  Daily,   R     Question:  Specimen collection method  Answer:  Lab=Lab collect   07/12/20 0756          Culture/Microbiology    Component Value Date/Time   SDES  07/04/2020 1309    BLOOD LEFT ANTECUBITAL Performed at [facility], [place] [address], [place], [place] [address]    [place]  07/04/2020 1309    BOTTLES DRAWN AEROBIC ONLY Blood Culture results may not be optimal due to an inadequate volume of blood received in culture bottles Performed at [facility], [place] [address], [place],  [address]    CULT  07/04/2020 1309    NO GROWTH 6 DAYS Performed at [place] [address], [place],  [address]    REPTSTATUS 07/10/2020 FINAL 07/04/2020 1309    Other culture-see note  Medications: Scheduled Meds: . bisacodyl  10 mg Rectal Daily  . Chlorhexidine Gluconate Cloth  6 each Topical Daily  . enoxaparin (LOVENOX) injection  40 mg Subcutaneous Daily  . feeding supplement (GLUCERNA  SHAKE)  237 mL Oral BID BM  . feeding supplement (PROSource TF)  45 mL Per Tube TID  . insulin aspart  0-15 Units Subcutaneous TID WC  . insulin detemir  5 Units Subcutaneous Daily  . lactulose  20 g Oral BID  . liver oil-zinc oxide   Topical TID  . mouth rinse  15 mL Mouth Rinse BID  . pantoprazole  40 mg Oral QHS  . pregabalin  50 mg Oral TID  . QUEtiapine  200 mg Oral QHS  . sodium chloride flush  10-40 mL Intracatheter Q12H  . sodium chloride flush  5 mL Intracatheter Q8H   Continuous Infusions: . sodium chloride Stopped (07/08/20 1031)  . feeding supplement (JEVITY 1.5 CAL/FIBER) Stopped (07/10/20 0005)    Antimicrobials: Anti-infectives (From admission, onward)   Start     Dose/Rate Route Frequency Ordered Stop   07/04/20 1330  vancomycin (VANCOREADY) IVPB 1500 mg/300 mL  Status:  Discontinued        1,500 mg 150 mL/hr over 120 Minutes Intravenous  Once 07/04/20 1234 07/04/20 1254   07/04/20 1330  vancomycin (VANCOREADY) IVPB 1750 mg/350 mL  Status:  Discontinued        1,750 mg 175 mL/hr over 120 Minutes Intravenous Every  24 hours 07/04/20 1254 07/05/20 0821   07/04/20 1245  piperacillin-tazobactam (ZOSYN) IVPB 3.375 g        3.375 g 12.5 mL/hr over 240 Minutes Intravenous Every 8 hours 07/04/20 1233 07/10/20 2359   06/27/20 2100  ciprofloxacin (CIPRO) IVPB 400 mg        400 mg 200 mL/hr over 60 Minutes Intravenous Every 12 hours 06/27/20 2057 07/02/20 0944     Objective: Vitals: Today's Vitals   07/12/20 0300 07/12/20 0400 07/12/20 0500 07/12/20 0600  BP:      Pulse: 86 82 83 84  Resp: 16 (!) 0 (!) 22 (!) 27  Temp:  98.5 F (36.9 C)    TempSrc:  Oral    SpO2: 99% 100% 100% 96%  Weight:   75 kg   Height:      PainSc: 5  Asleep      Intake/Output Summary (Last 24 hours) at 07/12/2020 0757 Last data filed at 07/12/2020 5597 Gross per 24 hour  Intake 280 ml  Output 950 ml  Net -670 ml   Filed Weights   07/08/20 0500 07/10/20 0500 07/12/20 0500  Weight:  74.5 kg 75.9 kg 75 kg   Weight change:   Intake/Output from previous day: 02/17 0701 - 02/18 0700 In: 280 [P.O.:240; I.V.:40] Out: 950 [Urine:850; Drains:100] Intake/Output this shift: No intake/output data recorded. Filed Weights   07/08/20 0500 07/10/20 0500 07/12/20 0500  Weight: 74.5 kg 75.9 kg 75 kg    Examination: General exam: AAOx3, old for age,NAD, weak appearing. HEENT:Oral mucosa moist, Ear/Nose WNL grossly, dentition normal. Respiratory system:Bilaterally clear,no wheezing or crackles,no use of accessory muscle Cardiovascular system:S1 & S2 +, No JVD,. Gastrointestinal system:Abdomen soft, NT, right upper quadrant drain in place,ND, BS+ Nervous System:Alert,awake,moving extremities and grossly non-focal. Extremities: No edema, distal peripheral pulses palpable.  Skin: No rashes,no icterus. MSK: Normal muscle bulk,tone, power.  Data Reviewed: I have personally reviewed following labs and imaging studies CBC: Recent Labs  Lab 07/08/20 0630 07/09/20 0455 07/10/20 0434 07/11/20 0254 07/12/20 0243  WBC 4.0 3.9* 4.1 5.0 4.9  HGB 8.7* 8.8* 8.5* 8.3* 8.1*  HCT 27.6* 29.3* 27.9* 27.6* 27.1*  MCV 78.2* 80.9 80.4 81.4 83.1  PLT 107* 107* 141* 182 416   Basic Metabolic Panel: Recent Labs  Lab 07/06/20 2044 07/06/20 2207 07/07/20 1631 07/08/20 0528 07/08/20 0630 07/08/20 1700 07/09/20 0930 07/10/20 0434 07/11/20 0254 07/12/20 0243  NA 140   < > 141   < > 142 140 140 144 145 143  K 2.3*   < > 2.7*   < > 3.1* 3.2* 3.2* 2.8* 3.1* 3.8  CL 116*   < > 115*   < > 112* 112* 112* 114* 113* 112*  CO2 15*   < > 20*   < > 21* 20* 20* 23 25 24   GLUCOSE 570*   < > 127*   < > 114* 142* 227* 169* 123* 137*  BUN 21*   < > 15   < > 12 9 8 8  <5* 6  CREATININE 0.65   < > 0.37*   < > 0.35* 0.34* 0.32* 0.37* 0.39* 0.35*  CALCIUM 6.7*   < > 6.9*   < > 7.8* 7.6* 7.8* 7.7* 8.0* 8.0*  MG 2.0  --  1.8  --   --   --  1.7 1.7 1.9  --   PHOS  --   --   --   --  <1.0*  --   --  2.0* 2.9   --    < > = values in this interval not displayed.   GFR: Estimated Creatinine Clearance: 78.8 mL/min (A) (by C-G formula based on SCr of 0.35 mg/dL (L)). Liver Function Tests: Recent Labs  Lab 07/07/20 0740 07/09/20 0930 07/10/20 0434 07/11/20 0254 07/12/20 0243  AST 50* 35 30 32 30  ALT 34 27 23 24 23   ALKPHOS 89 122 114 121 138*  BILITOT 1.1 0.8 0.6 0.5 0.5  PROT 5.9* 5.8* 5.3* 5.5* 5.5*  ALBUMIN 2.9* 2.9* 2.5* 2.7* 2.6*   No results for input(s): LIPASE, AMYLASE in the last 168 hours. Recent Labs  Lab 07/06/20 2043 07/07/20 1830  AMMONIA 62* 46*   Coagulation Profile: No results for input(s): INR, PROTIME in the last 168 hours. Cardiac Enzymes: No results for input(s): CKTOTAL, CKMB, CKMBINDEX, TROPONINI in the last 168 hours. BNP (last 3 results) No results for input(s): PROBNP in the last 8760 hours. HbA1C: No results for input(s): HGBA1C in the last 72 hours. CBG: Recent Labs  Lab 07/11/20 0840 07/11/20 1149 07/11/20 1632 07/11/20 2105 07/12/20 0735  GLUCAP 112* 142* 176* 189* 123*   Lipid Profile: No results for input(s): CHOL, HDL, LDLCALC, TRIG, CHOLHDL, LDLDIRECT in the last 72 hours. Thyroid Function Tests: No results for input(s): TSH, T4TOTAL, FREET4, T3FREE, THYROIDAB in the last 72 hours. Anemia Panel: No results for input(s): VITAMINB12, FOLATE, FERRITIN, TIBC, IRON, RETICCTPCT in the last 72 hours. Sepsis Labs: Recent Labs  Lab 07/06/20 0411  PROCALCITON 40.69    Recent Results (from the past 240 hour(s))  MRSA PCR Screening     Status: None   Collection Time: 07/04/20 10:52 AM   Specimen: Nasal Mucosa; Nasopharyngeal  Result Value Ref Range Status   MRSA by PCR NEGATIVE NEGATIVE Final    Comment:        The GeneXpert MRSA Assay (FDA approved for NASAL specimens only), is one component of a comprehensive MRSA colonization surveillance program. It is not intended to diagnose MRSA infection nor to guide or monitor treatment  for MRSA infections. Performed at [facility], [place] [address], [place], [place] [address]   Culture, blood (Routine X 2) w Reflex to ID Panel     Status: None   Collection Time: 07/04/20 12:58 PM   Specimen: BLOOD  Result Value Ref Range Status   Specimen Description   Final    BLOOD RIGHT ANTECUBITAL Performed at [place] [address], [place], [place] [address]    Special Requests   Final    BOTTLES DRAWN AEROBIC AND ANAEROBIC Blood Culture adequate volume Performed at [place] [address], [place], [place] [address]    Culture   Final    NO GROWTH 6 DAYS Performed at [facility], [place] [address], [place], [place] [address]    Report Status 07/10/2020 FINAL  Final  Culture, blood (Routine X 2) w Reflex to ID Panel     Status: None   Collection Time: 07/04/20  1:09 PM   Specimen: BLOOD  Result Value Ref Range Status   Specimen Description   Final    BLOOD LEFT ANTECUBITAL Performed at [place] [address] [place], [place] [address]    Special Requests   Final    BOTTLES DRAWN AEROBIC ONLY Blood Culture results may not be optimal due to an inadequate volume of blood received in culture bottles Performed at [facility],  [place] [address] [place] [address]    Culture   Final    NO GROWTH 6 DAYS Performed at [facility], [place] [address], [place], [place] [address]    Report Status 07/10/2020 FINAL  Final     Radiology Studies: IR EXCHANGE BILIARY DRAIN  Result Date: 07/10/2020 INDICATION: 55 year old woman with history of biliary leak status post cholecystectomy treated by placement of 10.2 [place] multipurpose pigtail drain in the gallbladder fossa on 05/20/2020. Patient has greater than 50 mL output from the drain on a daily basis. Surgical service requesting downsizing of drain. EXAM: Gallbladder fossa abscess drain downsize.  MEDICATIONS: None ANESTHESIA/SEDATION: None FLUOROSCOPY TIME:  Fluoroscopy Time: 0 minutes 54 seconds (24 mGy). COMPLICATIONS: None immediate. PROCEDURE: Informed written consent was obtained from the patient after a thorough discussion of the procedural risks, benefits and alternatives. All questions were addressed. Maximal Sterile Barrier Technique was utilized including caps, mask, sterile gowns, sterile gloves, sterile drape, hand hygiene and skin antiseptic. A timeout was performed prior to the initiation of the procedure. External segment of the right upper quadrant drain and surrounding skin was prepped and draped in usual fashion. Contrast administered through the drain under fluoroscopy showed a small residual cavity and communication with the cystic duct remnant. The existing 10.2 [place] drain was cut and removed over a guidewire and replaced with a smaller caliber 8.5 [place] multipurpose pigtail drain. Contrast administered through the new drain under fluoroscopy demonstrated appropriate positioning within the gallbladder fossa. Drain flushed, reattached to bag, and secured to skin with Prolene suture. IMPRESSION: 10.2 French gallbladder fossa drain downsized to 8.5 [place] per surgery service request. Persistent communication with cystic duct seen during abscessogram. Electronically Signed   By: Miachel Roux M.D.   On: 07/10/2020 13:36     LOS: 14 days   Antonieta Pert, MD [facility]  07/12/2020, 7:57 AM

## 2020-07-13 DIAGNOSIS — J9601 Acute respiratory failure with hypoxia: Secondary | ICD-10-CM | POA: Diagnosis not present

## 2020-07-13 DIAGNOSIS — K859 Acute pancreatitis without necrosis or infection, unspecified: Secondary | ICD-10-CM | POA: Diagnosis not present

## 2020-07-13 DIAGNOSIS — R1011 Right upper quadrant pain: Secondary | ICD-10-CM | POA: Diagnosis not present

## 2020-07-13 DIAGNOSIS — K9189 Other postprocedural complications and disorders of digestive system: Secondary | ICD-10-CM | POA: Diagnosis not present

## 2020-07-13 LAB — COMPREHENSIVE METABOLIC PANEL
ALT: 22 U/L (ref 0–44)
AST: 28 U/L (ref 15–41)
Albumin: 2.6 g/dL — ABNORMAL LOW (ref 3.5–5.0)
Alkaline Phosphatase: 157 U/L — ABNORMAL HIGH (ref 38–126)
Anion gap: 7 (ref 5–15)
BUN: 9 mg/dL (ref 6–20)
CO2: 24 mmol/L (ref 22–32)
Calcium: 8.3 mg/dL — ABNORMAL LOW (ref 8.9–10.3)
Chloride: 110 mmol/L (ref 98–111)
Creatinine, Ser: 0.34 mg/dL — ABNORMAL LOW (ref 0.44–1.00)
GFR, Estimated: >60 mL/min (ref >60)
Glucose, Bld: 252 mg/dL — ABNORMAL HIGH (ref 70–99)
Potassium: 4.1 mmol/L (ref 3.5–5.1)
Sodium: 141 mmol/L (ref 135–145)
Total Bilirubin: 0.4 mg/dL (ref 0.3–1.2)
Total Protein: 5.7 g/dL — ABNORMAL LOW (ref 6.5–8.1)

## 2020-07-13 LAB — CBC
HCT: 27.7 % — ABNORMAL LOW (ref 36.0–46.0)
Hemoglobin: 8.3 g/dL — ABNORMAL LOW (ref 12.0–15.0)
MCH: 24.8 pg — ABNORMAL LOW (ref 26.0–34.0)
MCHC: 30 g/dL (ref 30.0–36.0)
MCV: 82.7 fL (ref 80.0–100.0)
Platelets: 237 10*3/uL (ref 150–400)
RBC: 3.35 MIL/uL — ABNORMAL LOW (ref 3.87–5.11)
RDW: 17.8 % — ABNORMAL HIGH (ref 11.5–15.5)
WBC: 4.9 10*3/uL (ref 4.0–10.5)
nRBC: 0 % (ref 0.0–0.2)

## 2020-07-13 LAB — GLUCOSE, CAPILLARY: Glucose-Capillary: 222 mg/dL — ABNORMAL HIGH (ref 70–99)

## 2020-07-13 MED ORDER — GLUCERNA SHAKE PO LIQD
237.0000 mL | Freq: Two times a day (BID) | ORAL | Status: DC
  Filled 2020-07-13: qty 237

## 2020-07-13 MED ORDER — SODIUM CHLORIDE 0.9% FLUSH: 5.0000 mL | Freq: Two times a day (BID) | INTRAVENOUS | 0 refills | Status: AC

## 2020-07-13 MED ORDER — PANTOPRAZOLE SODIUM 40 MG PO TBEC: 40.0000 mg | DELAYED_RELEASE_TABLET | Freq: Every day | ORAL | 0 refills | Status: DC

## 2020-07-13 MED ORDER — QUETIAPINE FUMARATE 400 MG PO TABS: 200.0000 mg | ORAL_TABLET | Freq: Every day | ORAL | Status: DC

## 2020-07-13 MED ORDER — LACTULOSE 10 GM/15ML PO SOLN: 10.0000 g | Freq: Every day | ORAL | 0 refills | Status: DC

## 2020-07-13 MED ORDER — INSULIN DETEMIR 100 UNIT/ML ~~LOC~~ SOLN
15.0000 [IU] | Freq: Every day | SUBCUTANEOUS | Status: DC
  Administered 2020-07-13: 15 [IU] via SUBCUTANEOUS
  Filled 2020-07-13: qty 0.15

## 2020-07-13 NOTE — Progress Notes (Signed)
PT Cancellation Note  Patient Details Name: Kim Hicks MRN: [id number] DOB: 1965-09-01   Cancelled Treatment:    Reason Eval/Treat Not Completed: Other (comment). Per chart review from OT and conversation with RN, pt is mobilizing around room independently and well equipped at home. Will sign off at this time, please re-consult if needs arise.    Talbot Grumbling PT, DPT 07/13/20, 8:18 AM

## 2020-07-13 NOTE — Discharge Summary (Signed)
Physician Discharge Summary  Kim Hicks ZLD:[id number] DOB: [birth year] DOA: 06/27/2020  PCP: [name], FNP  Admit date: 06/27/2020 Discharge date: 07/13/2020  Admitted From: Home Disposition: Home  Recommendations for Outpatient Follow-up:  1. Follow up with PCP in 1-2 weeks 2. Follow-up with [facility] 2 weeks 3. Follow-up with interventional radiology outpatient for repeat CT with possible drain injection when output less than 10 mL/day to assess for possible removal  Home Health: No Equipment/Devices: None  Discharge Condition: Stable CODE STATUS: Full code  Diet recommendation: Heart healthy/consistent carbohydrate diet  History of present illness:  Kim Hicks is a 55 year old female with past medical history significant for type 2 diabetes mellitus, essential hypertension, neuropathy, migraine headache, NASH cirrhosis, GERD, IBS, known persistent bile leak following laparoscopic cholecystectomy on 08/04/2019 who was admitted with post ERCP pancreatitis.  On 07/04/2020, patient was noted to be obtunded suspected from medication induced and was given Narcan.  Patient was noted to be hypotensive with likely aspiration and was transferred to the intensive care unit for need of IV vasopressors for septic shock from underlying aspiration pneumonia.  Patient was seen during hospitalization by gastroenterology, general surgery, neurology and interventional radiology.  Patient had her biliary drain downsized by interventional radiology.  Patient was able to be titrated off of vasopressors and completed IV antibiotic course for aspiration pneumonia.  Patient was transferred out of the ICU to the hospitalist service on 07/09/2020.   Hospital course:  Post ERCP pancreatitis Persistent bile leak Patient initially presented to the ED with abdominal pain following biliary stent exchanged by gastroenterology the day prior.  Gastroenterology and general surgery followed during  hospital course.  Patient had an IR guided pigtail drain placed the gallbladder fossa which was downsized on 07/10/2020.  Patient was initially on tube feeds and eventually transitioned back to oral diet with good toleration.  Patient will continue gallbladder fossa drain on discharge and instructed to flush twice daily.  Outpatient follow-up with GI, general surgery and interventional radiology.  Once drain output is less than 10 mL/day, IR considering repeat CT scan with drain injection to consider drain removal at that time.  Septic shock secondary to aspiration pneumonia Acute metabolic encephalopathy: Resolved On 07/04/2020, patient was noted to be lethargic and obtunded.  Likely secondary to IV pain medication administration.  Patient was given Narcan, but unfortunately aspirated and became hypotensive and subsequently was transferred to the ICU for need of vasopressors.  Patient completed IV antibiotic course of Zosyn for aspiration pneumonia and was eventually titrated off of vasopressors.  Patient was seen by neurology with encephalopathy likely secondary to acute infectious process compounded by narcotic need.  Patient now back to her normal baseline.  Hypokalemia Hypophosphatemia Hypomagnesemia Monitored closely and repleted during hospitalization with resolution.  NASH cirrhosis Continue lactulose.  Outpatient follow-up with gastroenterology  Type 2 diabetes mellitus, with hyperglycemia Hemoglobin A1c 8.9, poorly controlled.  Continue home dulaglutide, Metformin, Lantus 30 units subcutaneously nightly.  Outpatient follow-up with PCP for further guidance and titration of diabetic regimen.  Discharge Diagnoses:  Active Problems:   Insulin dependent type 2 diabetes mellitus ([place])   Hyperlipidemia associated with type 2 diabetes mellitus ([place])   Dehydration   Hypertension associated with diabetes ([place])   GERD   Chronic constipation   Abdominal pain, right upper quadrant   Other  cirrhosis of liver (HCC)   NASH (nonalcoholic steatohepatitis)   Diabetic peripheral neuropathy (HCC)   Post-ERCP acute pancreatitis   Pancreatitis  Pressure injury of skin   Septic shock (HCC)   Acute respiratory failure with hypoxemia [facility])   Metabolic encephalopathy    Discharge Instructions  Discharge Instructions    Call MD for:  difficulty breathing, headache or visual disturbances   Complete by: As directed    Call MD for:  extreme fatigue   Complete by: As directed    Call MD for:  persistant dizziness or light-headedness   Complete by: As directed    Call MD for:  persistant nausea and vomiting   Complete by: As directed    Call MD for:  severe uncontrolled pain   Complete by: As directed    Call MD for:  temperature >100.4   Complete by: As directed    Diet - low sodium heart healthy   Complete by: As directed    Increase activity slowly   Complete by: As directed    No wound care   Complete by: As directed      Allergies as of 07/13/2020      Reactions   Talwin [pentazocine]    Turned blue around lips and face, rash all over body      Medication List    STOP taking these medications   amoxicillin-clavulanate 875-125 MG tablet Commonly known as: AUGMENTIN   ciprofloxacin 500 MG tablet Commonly known as: Cipro   Linzess 290 MCG Caps capsule Generic drug: linaclotide   lisinopril 20 MG tablet Commonly known as: ZESTRIL     TAKE these medications   acetaminophen 500 MG tablet Commonly known as: TYLENOL Take 1,000 mg by mouth every 6 (six) hours as needed for moderate pain.   Basaglar KwikPen 100 UNIT/ML Inject 30 Units into the skin at bedtime.   brexpiprazole 2 MG Tabs tablet Commonly known as: REXULTI Take 2 mg by mouth at bedtime.   Dulaglutide 4.5 MG/0.5ML Sopn Inject 4.5 mg into the skin every Monday.   ezetimibe 10 MG tablet Commonly known as: ZETIA Take 10 mg by mouth daily.   lactulose 10 GM/15ML solution Commonly known as:  CHRONULAC Take 15 mLs (10 g total) by mouth daily. Start taking on: July 14, 2020   meloxicam 15 MG tablet Commonly known as: MOBIC Take 15 mg by mouth daily.   metFORMIN 1000 MG tablet Commonly known as: GLUCOPHAGE Take 1,000 mg by mouth at bedtime.   NUTRITIONAL SHAKE HIGH PROTEIN PO Take 237 mLs by mouth 2 (two) times daily.   ondansetron 4 MG disintegrating tablet Commonly known as: ZOFRAN-ODT Take 4 mg by mouth every 8 (eight) hours as needed for nausea or vomiting.   pantoprazole 40 MG tablet Commonly known as: Protonix Take 1 tablet (40 mg total) by mouth daily.   prazosin 2 MG capsule Commonly known as: MINIPRESS Take 4 mg by mouth daily.   pregabalin 100 MG capsule Commonly known as: LYRICA TAKE 1 CAPSULE BY MOUTH THREE TIMES A DAY What changed: See the new instructions.   QUEtiapine 400 MG tablet Commonly known as: SEROQUEL Take 0.5 tablets (200 mg total) by mouth at bedtime. What changed: how much to take   simvastatin 10 MG tablet Commonly known as: ZOCOR Take 10 mg by mouth daily.   sodium chloride flush 0.9 % Soln Commonly known as: NS Inject 5 mLs into the vein every 12 (twelve) hours.   Synjardy XR 25-1000 MG Tb24 Generic drug: Empagliflozin-metFORMIN HCl ER Take 1 tablet by mouth daily before breakfast.   topiramate 200 MG tablet Commonly known as:  TOPAMAX Take 200 mg by mouth 2 (two) times daily.   vortioxetine HBr 20 MG Tabs tablet Commonly known as: [name] Take 20 mg by mouth at bedtime.   ziprasidone 20 MG capsule Commonly known as: [name] Take 20 mg by mouth at bedtime.       Follow-up Information    [name], [place]. Schedule an appointment as soon as possible for a visit in 1 week(s).   Specialty: Nurse Practitioner Contact information: [place] [address] [facility], [place] Follow up in 2 week(s).   Specialty: General Surgery Contact information: [place] [address] [phone]             Allergies  Allergen Reactions  . Talwin [Pentazocine]     Turned blue around lips and face, rash all over body    Consultations:  [facility]  General surgery  Neurology   Procedures/Studies: EEG  Result Date: 07/08/2020 Lora Havens, MD     07/08/2020  1:03 PM Patient Name: TACARRA JUSTO MRN: [id number] Epilepsy Attending: Lora Havens Referring Physician/Provider: Dr. Amie [place] Date: 07/08/2020 Duration: 23.55 mins Patient history: 2 old female with altered mental status, noted to have speech disturbance.  EEG to evaluate for seizures. Level of alertness: Awake, asleep AEDs during EEG study: None Technical aspects: This EEG study was done with scalp electrodes positioned according to the 10-20 International system of electrode placement. Electrical activity was acquired at a sampling rate of 500Hz  and reviewed with a high frequency filter of 70Hz  and a low frequency filter of 1Hz . EEG data were recorded continuously and digitally stored. Description: The posterior dominant rhythm consists of 8 Hz activity of moderate voltage (25-35 uV) seen predominantly in posterior head regions, symmetric and reactive to eye opening and eye closing. Sleep was characterized by sleep spindles (12- 14 Hz), maximal frontocentral region. EEG showed continuous generalized 3 to 6 Hz theta-delta slowing. Generalized periodic discharges with triphasic morphology were noted at 1-1.5hz  which were predominantly seen when patient was awake/stimulated. Hyperventilation and photic stimulation were not performed.   ABNORMALITY -Continuous slow, generalized -Periodic discharges with triphasic morphology, generalized IMPRESSION: This study is suggestive of mild diffuse encephalopathy, nonspecific to etiology but could be secondary to toxic-metabolic causes. No seizures or definite epileptiform discharges  were seen throughout the recording. Priyanka Barbra Sarks   CT ABDOMEN PELVIS WO CONTRAST  Result Date: 07/04/2020 CLINICAL DATA:  Fever, hypotensive, anemia, abdominal pain EXAM: CT ABDOMEN AND PELVIS WITHOUT CONTRAST TECHNIQUE: Multidetector CT imaging of the abdomen and pelvis was performed following the standard protocol without IV contrast. COMPARISON:  06/27/2020 FINDINGS: Lower chest: Dependent lower lobe consolidation favor airspace disease over atelectasis. No effusion or pneumothorax. Unenhanced CT was performed per clinician order. Lack of IV contrast limits sensitivity and specificity, especially for evaluation of abdominal/pelvic solid viscera. Hepatobiliary: Nodularity of the liver capsule compatible cirrhosis. No focal intrahepatic finding on this unenhanced exam. Stable percutaneous drainage catheter within the gallbladder fossa with no evidence of recurrence or residual fluid collection. Gallbladder is surgically absent. There are 4 stents within the downstream common bile duct, extending into the duodenal lumen. The stents have migrated distally since prior study, but are still patent with pneumobilia again identified. Pancreas: No gross abnormalities on this unenhanced exam. Spleen: Normal in size without focal abnormality. Adrenals/Urinary Tract: Adrenal glands are unremarkable. Kidneys are normal, without renal calculi,  focal lesion, or hydronephrosis. Bladder is unremarkable. Stomach/Bowel: Large amount of retained stool within the distal colon consistent with constipation. There is colonic wall thickening within the distal transverse colon through the splenic flexure, consistent with inflammatory or infectious colitis. No bowel obstruction or ileus. Enteric catheter extends through the gastric lumen, tip within the second portion duodenum. Vascular/Lymphatic: No significant vascular findings are present. No enlarged abdominal or pelvic lymph nodes. Reproductive: Status post hysterectomy. No  adnexal masses. Other: No free fluid or free gas. No abdominal wall hernia. No evidence of retroperitoneal hemorrhage. Musculoskeletal: There are no acute or destructive bony lesions. Reconstructed images demonstrate no additional findings. IMPRESSION: 1. Interval development of bibasilar airspace disease, which could reflect aspiration or infection. 2. Percutaneous drainage catheter within the gallbladder fossa, with no evidence of recurrence or residual fluid collection. 3. Downstream migration of the 4 biliary stents, traversing the distal common bile duct and ampulla. The stents remain patent, with mild pneumobilia again noted. 4. Increasing fecal retention consistent with constipation. Developing bowel wall thickening of the distal transverse colon could reflect inflammatory, infectious, or stercoral colitis. 5. Cirrhosis. 6. No evidence of acute hemorrhage to explain the patient's anemia. Electronically Signed   By: Randa Ngo M.D.   On: 07/04/2020 18:53   DG Abd 1 View  Result Date: 07/02/2020 CLINICAL DATA:  Enteric tube placement EXAM: ABDOMEN - 1 VIEW COMPARISON:  April 11, 2019 FINDINGS: Enteric tube tip is at the level of the first portion of the duodenum. There is moderate stool throughout the colon. No bowel dilatation or air-fluid level to suggest bowel obstruction. No free air. There are apparent biliary stents in the right abdomen. There is an apparent drain in the lateral upper right abdomen. There are safety pins which presumably overlies the pelvis. There are tubal ligation clips bilaterally. IMPRESSION: Enteric tube tip at level of first portion of duodenum. No bowel obstruction or free air. Moderate stool in colon. Apparent biliary stents. Drain in right upper abdomen laterally noted. Electronically Signed   By: Lowella Grip III M.D.   On: 07/02/2020 15:24   CT HEAD WO CONTRAST  Result Date: 07/07/2020 CLINICAL DATA:  Mental status change. EXAM: CT HEAD WITHOUT CONTRAST  TECHNIQUE: Contiguous axial images were obtained from the base of the skull through the vertex without intravenous contrast. COMPARISON:  02/16/2020 FINDINGS: Brain: Stable mild age advanced cerebral atrophy. The ventricles are in the midline without mass effect or shift. No extra-axial fluid collections. No CT findings for acute hemispheric infarction or intracranial hemorrhage. No extra-axial fluid collections are identified. Brainstem and cerebellum are grossly normal. Vascular: No aneurysm or hyperdense vessels. Skull: No skull fracture or bone lesions. Sinuses/Orbits: The paranasal sinuses and mastoid air cells are clear. The globes are intact. Other: No scalp lesions or scalp hematoma. IMPRESSION: 1. Stable mild age advanced cerebral atrophy. 2. No acute intracranial findings or mass lesions. Electronically Signed   By: Marijo Sanes M.D.   On: 07/07/2020 15:12   MR BRAIN WO CONTRAST  Result Date: 07/08/2020 CLINICAL DATA:  Neuro deficit, acute, stroke suspected. EXAM: MRI HEAD WITHOUT CONTRAST TECHNIQUE: Multiplanar, multiecho pulse sequences of the brain and surrounding structures were obtained without intravenous contrast. COMPARISON:  Head CT July 07, 2020. FINDINGS: The study is partially degraded by motion. Brain: No acute infarction, hemorrhage, hydrocephalus, extra-axial collection or mass lesion. Vascular: Normal flow voids. Skull and upper cervical spine: Normal marrow signal. Sinuses/Orbits: Bilateral lens surgery. Paranasal sinuses are clear. IMPRESSION: 1. Motion degraded  study. 2. No acute intracranial abnormality identified. Electronically Signed   By: Pedro Earls M.D.   On: 07/08/2020 11:20   CT ABDOMEN PELVIS W CONTRAST  Result Date: 06/27/2020 CLINICAL DATA:  Right lower quadrant pain. ERCP with biliary stent exchange yesterday. EXAM: CT ABDOMEN AND PELVIS WITH CONTRAST TECHNIQUE: Multidetector CT imaging of the abdomen and pelvis was performed using the standard  protocol following bolus administration of intravenous contrast. CONTRAST:  155m OMNIPAQUE IOHEXOL 300 MG/ML  SOLN COMPARISON:  CT 06/07/2020 FINDINGS: Lower chest: No pleural fluid or focal airspace disease. Hepatobiliary: Percutaneous drainage catheter coiled in the gallbladder fossa. No focal fluid collection in the cholecystectomy bed. There is a punctate focus of air at the porta hepatis which is likely related to recent drain interrogation. There are 4 biliary stents extending from the porta hepatis to the duodenum. Common bile duct is decompressed by biliary stents. There is no intrahepatic biliary ductal dilatation. Cirrhotic hepatic morphology with nodular contours again seen. No intrahepatic fluid collection or focal lesion. Patent portal vein. Pancreas: No peripancreatic fat stranding or evidence pancreatitis post ERCP. No pancreatic ductal dilatation. Spleen: Splenomegaly spanning 14.3 cm cranial caudal, unchanged. No focal splenic abnormality. Adrenals/Urinary Tract: Normal adrenal glands. No hydronephrosis or perinephric edema. Homogeneous renal enhancement with symmetric excretion on delayed phase imaging. Urinary bladder is physiologically distended without wall thickening. Stomach/Bowel: Fluid distended stomach. No gastric wall thickening. No duodenal wall thickening. Proximal small bowel are decompressed. Distal small bowel mildly fluid-filled and prominent. There is fecalization of distal small bowel contents. No terminal ileal inflammation. Normal appendix. Large colonic stool with transverse colonic tortuosity. No colonic wall thickening or pericolonic edema. There is stool in the rectum. Vascular/Lymphatic: Normal caliber abdominal aorta. Patent portal and splenic veins. No mesenteric gas or thrombosis. No abdominopelvic adenopathy. Reproductive: Hysterectomy. Ovaries not visualized. No adnexal mass. Presumed tubal ligation clips. Other: No ascites or free fluid. Tiny focus of air in the porta  hepatis is likely related to recent drain manipulation. No pneumoperitoneum. No focal fluid collection in the abdomen or pelvis. Musculoskeletal: There are no acute or suspicious osseous abnormalities. IMPRESSION: 1. Percutaneous drainage catheter coiled in the gallbladder fossa without focal fluid collection. 2. New 4 biliary stents extending from the porta hepatis to the duodenum. Common bile duct is decompressed by biliary stents. No intrahepatic biliary ductal dilatation. 3. No other acute findings. 4. Cirrhotic hepatic morphology with splenomegaly. 5. Large colonic stool burden with transverse colonic tortuosity, suggesting constipation. Distal small bowel mildly fluid-filled and prominent, with fecalization of distal small bowel contents, suggesting slow transit. Electronically Signed   By: MKeith RakeM.D.   On: 06/27/2020 18:39   DG Sinus/Fist Tube Chk-Non GI  Result Date: 06/20/2020 CLINICAL DATA:  History of cholecystectomy performed in March of last year complicated by development of a bile leak, initially treated with endoscopic stent placement however ultimately required percutaneous drainage catheter placement on 05/23/2020 (Kathlene Cote. Patient presents today to the interventional radiology drain clinic for drainage catheter evaluation and management Patient reports high volume output from the drainage catheter for approximately 400 cc per day. The patient continues to flush the drainage catheter with 10 cc of saline once per day. Patient continues to complain of right upper quadrant abdominal pain. She denies fever or chills. EXAM: ABSCESS INJECTION COMPARISON:  CT abdomen pelvis-06/07/2020; 05/20/2020 CT-guided biliary fossa drainage catheter placement-05/23/2020 ERCP-05/27/2020 CONTRAST:  20 cc Omnipaque 300-administered via the existing percutaneous drain. FLUOROSCOPY TIME:  42 seconds (118 mGy) TECHNIQUE:  The patient was positioned supine on the fluoroscopy table. A preprocedural spot  fluoroscopic image was obtained of the right upper abdominal quadrant and the existing percutaneous drainage catheter. Multiple spot fluoroscopic and radiographic images were obtained following the injection of a small amount of contrast via the existing percutaneous drainage catheter. Images were reviewed and discussed with patient. The drainage catheter was flushed with a small amount of saline and reconnected to a gravity bag. A dressing was applied. The patient tolerated the procedure well without immediate postprocedural complication. FINDINGS: Preprocedural spot fluoroscopic image demonstrates unchanged positioning of percutaneous drainage catheter with end coiled and locked over the expected location of the gallbladder fossa, adjacent to cholecystectomy clips. An internal plastic biliary stent overlies expected location of the distal aspect of the CBD. Contrast injection demonstrates opacification of the decompressed abscess cavity with brisk communication between the decompressed abscess cavity and the residual cystic duct. There is brisk passage of contrast through the cystic duct and distal aspect of the CBD to the level of the duodenum. Note is made of a caudal insertion of the cystic duct into the CBD. The plastic internal biliary stent appears to traverse the confluence of the cystic duct and the CBD though appears to have slightly inferiorly migrated. There is minimal reflux of contrast into the central aspect of the intrahepatic biliary tree which appears nondilated. IMPRESSION: 1. Findings compatible with a bile leak between the decompressed gallbladder fossa abscess cavity and the cystic duct remnant. 2. Caudal insertion of the cystic duct into the CBD. 3. Slight inferior migration of the internal biliary stent though its mid aspect still traverses the confluence of the cystic duct and the CBD. PLAN: - The patient was instructed to no longer flush the percutaneous drainage catheter but to maintain  diligent records regarding daily drainage catheter output. - The patient was encouraged to maintain her follow-up appointment with Dr. Fuller Plan scheduled for early February as given high volume output of approximately 400 cc per day, the patient may ultimately require conversion from a plastic stent to a covered stent at the discretion of Dr. Fuller Plan. - The patient may return to the interventional radiology drain clinic at the discretion of Dr. Fuller Plan, however ultimately may require drainage catheter exchange approximately 8 weeks following placement (late February 2022). Electronically Signed   By: Sandi Mariscal M.D.   On: 06/20/2020 11:00   DG Chest Port 1 View  Result Date: 07/08/2020 CLINICAL DATA:  Aspiration. EXAM: PORTABLE CHEST 1 VIEW COMPARISON:  July 05, 2018. FINDINGS: Feeding tube courses below the diaphragm and outside the field of view. Right IJ central venous catheter with the tip projecting at the right atrium, similar to prior. Interval removal of the right PICC. Improved bilateral airspace opacities. No visible pleural effusions or pneumothorax. Similar cardiomediastinal silhouette. IMPRESSION: 1. Improved bilateral airspace opacities. 2. Support devices as above. Electronically Signed   By: Margaretha Sheffield MD   On: 07/08/2020 07:39   DG CHEST PORT 1 VIEW  Result Date: 07/05/2020 CLINICAL DATA:  Central line placement. EXAM: PORTABLE CHEST 1 VIEW COMPARISON:  07/04/2020 FINDINGS: 0548 hours. Low lung volumes. The cardio pericardial silhouette is enlarged. Patchy bilateral airspace opacity again noted, right greater than left. No substantial pleural effusion. A feeding tube passes into the stomach although the distal tip position is not included on the film. Right IJ central line tip overlies the mid to low right atrium. Right PICC line tip overlies the proximal SVC. Bones are diffusely demineralized. Telemetry leads  overlie the chest. IMPRESSION: 1. Support apparatus as above. 2. Patchy  bilateral airspace disease, right greater than left, similar to prior. Electronically Signed   By: Misty Stanley M.D.   On: 07/05/2020 06:16   DG CHEST PORT 1 VIEW  Result Date: 07/04/2020 CLINICAL DATA:  Hypoxia.  NG placement.  Rapid response. EXAM: PORTABLE CHEST 1 VIEW COMPARISON:  11/07/2019 FINDINGS: Feeding tube overlies the left main bronchus. This could be in the mid esophagus or in the airway. Recommend repositioning. Patchy airspace disease bilaterally right greater than left. No significant effusion. Heart size enlarged. IMPRESSION: Feeding tube overlies the left main bronchus and could be in the mid esophagus or in the airway. Recommend repositioning Bilateral airspace disease right greater than left. Possible pneumonia or edema. Electronically Signed   By: Franchot Gallo M.D.   On: 07/04/2020 10:56   DG ERCP BILIARY & PANCREATIC DUCTS  Result Date: 06/26/2020 CLINICAL DATA:  Bile leak and status post percutaneous catheter drainage gallbladder fossa as well as prior endoscopic stenting procedures. EXAM: ERCP TECHNIQUE: Multiple spot images obtained with the fluoroscopic device and submitted for interpretation post-procedure. COMPARISON:  Drainage catheter injection study on 06/20/2020, ERCP on 05/27/2020 and multiple prior imaging studies. FINDINGS: Imaging at the time of ERCP demonstrates removal of the pre-existing endoscopic plastic biliary stent, cholangiogram demonstrating bile leak at the level of the cystic duct stump with opacification of the gallbladder fossa and indwelling gallbladder fossa drain and balloon sweep maneuver. Final image shows the presence of 4 parallel plastic endoscopic biliary stents within the common bile duct of different lengths. IMPRESSION: Persistent bile leak at the level of the cystic duct stump. Placement of 4 new parallel plastic endoscopic biliary stents within the common bile duct. These images were submitted for radiologic interpretation only. Please see  the procedural report for the amount of contrast and the fluoroscopy time utilized. Electronically Signed   By: Aletta Edouard M.D.   On: 06/26/2020 12:30   DG Abd Portable 1V  Result Date: 07/04/2020 CLINICAL DATA:  Feeding tube placement EXAM: PORTABLE ABDOMEN - 1 VIEW COMPARISON:  07/02/2020 FINDINGS: Feeding tube passes through the stomach with the tip in the right upper quadrant. This could be in the duodenum or in the antrum. Biliary drain on the right. Multiple common bile duct stents are present. Normal bowel gas pattern. IMPRESSION: Feeding tube passes through the stomach. Tip in the antrum or proximal duodenum. Electronically Signed   By: Franchot Gallo M.D.   On: 07/04/2020 10:57   IR EXCHANGE BILIARY DRAIN  Result Date: 07/10/2020 INDICATION: 55 year old woman with history of biliary leak status post cholecystectomy treated by placement of 10.2 [place] multipurpose pigtail drain in the gallbladder fossa on 05/20/2020. Patient has greater than 50 mL output from the drain on a daily basis. Surgical service requesting downsizing of drain. EXAM: Gallbladder fossa abscess drain downsize. MEDICATIONS: None ANESTHESIA/SEDATION: None FLUOROSCOPY TIME:  Fluoroscopy Time: 0 minutes 54 seconds (24 mGy). COMPLICATIONS: None immediate. PROCEDURE: Informed written consent was obtained from the patient after a thorough discussion of the procedural risks, benefits and alternatives. All questions were addressed. Maximal Sterile Barrier Technique was utilized including caps, mask, sterile gowns, sterile gloves, sterile drape, hand hygiene and skin antiseptic. A timeout was performed prior to the initiation of the procedure. External segment of the right upper quadrant drain and surrounding skin was prepped and draped in usual fashion. Contrast administered through the drain under fluoroscopy showed a small residual cavity and communication with the  cystic duct remnant. The existing 10.2 [place] drain was cut and  removed over a guidewire and replaced with a smaller caliber 8.5 [place] multipurpose pigtail drain. Contrast administered through the new drain under fluoroscopy demonstrated appropriate positioning within the gallbladder fossa. Drain flushed, reattached to bag, and secured to skin with Prolene suture. IMPRESSION: 10.2 [place] gallbladder fossa drain downsized to 8.5 [place] per surgery service request. Persistent communication with cystic duct seen during abscessogram. Electronically Signed   By: Miachel Roux M.D.   On: 07/10/2020 13:36   [place] EKG SITE RITE  Result Date: 07/04/2020 If Site Rite image not attached, placement could not be confirmed due to current cardiac rhythm.  IR Radiologist Eval & Mgmt  Result Date: 06/20/2020 Please refer to notes tab for details about interventional procedure. (Op Note)     Subjective: Patient seen and examined at bedside, resting comfortably.  Husband present.  Ready for discharge home.  Patient understands maintenance of biliary drain with flushing requirements.  No other questions or concerns at this time.  Denies headache, no fever/chills/night sweats, no nausea/vomiting/diarrhea, no chest pain, no palpitations, no shortness of breath, no abdominal pain, no weakness, no fatigue, no paresthesias.  No acute events overnight per nursing staff.  Discharge Exam: Vitals:   07/13/20 0300 07/13/20 0640  BP: (!) 137/56 133/64  Pulse: 96 93  Resp: 15 15  Temp: 99.4 F (37.4 C) (!) 97.3 F (36.3 C)  SpO2: 96% 98%   Vitals:   07/12/20 1853 07/12/20 2238 07/13/20 0300 07/13/20 0640  BP: (!) 155/69 (!) 149/74 (!) 137/56 133/64  Pulse: 92 90 96 93  Resp:  18 15 15   Temp: 98.6 F (37 C) 98.6 F (37 C) 99.4 F (37.4 C) (!) 97.3 F (36.3 C)  TempSrc: Oral Oral Oral Oral  SpO2: 99% 100% 96% 98%  Weight:      Height:        General: Pt is alert, awake, not in acute distress Cardiovascular: RRR, S1/S2 +, no rubs, no gallops Respiratory: CTA bilaterally,  no wheezing, no rhonchi, oxygen well on room air Abdominal: Soft, NT, ND, bowel sounds +, gallbladder drain noted with bilious drainage in bag Extremities: no edema, no cyanosis    The results of significant diagnostics from this hospitalization (including imaging, microbiology, ancillary and laboratory) are listed below for reference.     Microbiology: Recent Results (from the past 240 hour(s))  MRSA PCR Screening     Status: None   Collection Time: 07/04/20 10:52 AM   Specimen: Nasal Mucosa; Nasopharyngeal  Result Value Ref Range Status   MRSA by PCR NEGATIVE NEGATIVE Final    Comment:        The GeneXpert MRSA Assay (FDA approved for NASAL specimens only), is one component of a comprehensive MRSA colonization surveillance program. It is not intended to diagnose MRSA infection nor to guide or monitor treatment for MRSA infections. Performed at [facility], [place] [address] [place], [place] [address]   Culture, blood (Routine X 2) w Reflex to ID Panel     Status: None   Collection Time: 07/04/20 12:58 PM   Specimen: BLOOD  Result Value Ref Range Status   Specimen Description   Final    BLOOD RIGHT ANTECUBITAL Performed at [facility] [address] [place], [place] [address]    Special Requests   Final    BOTTLES DRAWN AEROBIC AND ANAEROBIC Blood Culture adequate volume Performed at [facility], [address] [place], [place] [address]    Culture   Final    NO GROWTH 6 DAYS Performed at [facility], [place] [address], [place], [place] [address]    Report Status 07/10/2020 FINAL  Final  Culture, blood (Routine X 2) w Reflex to ID Panel     Status: None   Collection Time: 07/04/20  1:09 PM   Specimen: BLOOD  Result Value Ref Range Status   Specimen Description   Final    BLOOD LEFT ANTECUBITAL Performed at [facility] [address] [place], [place] [address]    Special  Requests   Final    BOTTLES DRAWN AEROBIC ONLY Blood Culture results may not be optimal due to an inadequate volume of blood received in culture bottles Performed at [place] [address] [place], [place] [address]    Culture   Final    NO GROWTH 6 DAYS Performed at [facility], [place] [address] [place], [place] [address]    Report Status 07/10/2020 FINAL  Final     Labs: BNP (last 3 results) No results for input(s): BNP in the last 8760 hours. Basic Metabolic Panel: Recent Labs  Lab 07/06/20 2044 07/06/20 2207 07/07/20 1631 07/08/20 0528 07/08/20 0630 07/08/20 1700 07/09/20 0930 07/10/20 0434 07/11/20 0254 07/12/20 0243 07/13/20 0543  NA 140   < > 141   < > 142   < > 140 144 145 143 141  K 2.3*   < > 2.7*   < > 3.1*   < > 3.2* 2.8* 3.1* 3.8 4.1  CL 116*   < > 115*   < > 112*   < > 112* 114* 113* 112* 110  CO2 15*   < > 20*   < > 21*   < > 20* 23 25 24 24   GLUCOSE 570*   < > 127*   < > 114*   < > 227* 169* 123* 137* 252*  BUN 21*   < > 15   < > 12   < > 8 8 <5* 6 9  CREATININE 0.65   < > 0.37*   < > 0.35*   < > 0.32* 0.37* 0.39* 0.35* 0.34*  CALCIUM 6.7*   < > 6.9*   < > 7.8*   < > 7.8* 7.7* 8.0* 8.0* 8.3*  MG 2.0  --  1.8  --   --   --  1.7 1.7 1.9  --   --   PHOS  --   --   --   --  <1.0*  --   --  2.0* 2.9  --   --    < > = values in this interval not displayed.   Liver Function Tests: Recent Labs  Lab 07/09/20 0930 07/10/20 0434 07/11/20 0254 07/12/20 0243 07/13/20 0543  AST 35 30 32 30 28  ALT 27 23 24 23 22   ALKPHOS 122 114 121 138* 157*  BILITOT 0.8 0.6 0.5 0.5 0.4  PROT 5.8* 5.3* 5.5* 5.5* 5.7*  ALBUMIN 2.9* 2.5* 2.7* 2.6* 2.6*   No results for input(s): LIPASE, AMYLASE in the last 168 hours. Recent Labs  Lab 07/06/20 2043 07/07/20 1830  AMMONIA 62* 46*   CBC: Recent Labs  Lab 07/09/20 0455 07/10/20 0434 07/11/20 0254 07/12/20 0243 07/13/20 0543  WBC 3.9* 4.1 5.0 4.9 4.9  HGB 8.8* 8.5* 8.3* 8.1* 8.3*  HCT  29.3* 27.9* 27.6* 27.1* 27.7*  MCV 80.9 80.4 81.4 83.1 82.7  PLT 107* 141* 182 210 237   Cardiac Enzymes: No results for input(s): CKTOTAL, CKMB, CKMBINDEX, TROPONINI in the last 168 hours. BNP: Invalid input(s): POCBNP CBG: Recent Labs  Lab 07/12/20 0735 07/12/20 1101 07/12/20 1606 07/12/20 2242 07/13/20 0710  GLUCAP 123* 183* 255* 173* 222*   D-Dimer No results for input(s): DDIMER in the last 72 hours. Hgb A1c No results for input(s): HGBA1C in the last 72 hours. Lipid Profile No results for input(s): CHOL, HDL, LDLCALC, TRIG, CHOLHDL, LDLDIRECT in the last 72 hours. Thyroid function studies No results for input(s): TSH, T4TOTAL, T3FREE, THYROIDAB in the last 72 hours.  Invalid input(s): FREET3 Anemia work up No results for input(s): VITAMINB12, FOLATE, FERRITIN, TIBC, IRON, RETICCTPCT in the last 72 hours. Urinalysis    Component Value Date/Time   COLORURINE STRAW (A) 06/27/2020 1658   APPEARANCEUR CLEAR 06/27/2020 1658   LABSPEC 1.029 06/27/2020 1658   PHURINE 6.0 06/27/2020 1658   GLUCOSEU >=500 (A) 06/27/2020 1658   HGBUR NEGATIVE 06/27/2020 1658   BILIRUBINUR NEGATIVE 06/27/2020 1658   KETONESUR NEGATIVE 06/27/2020 1658   PROTEINUR NEGATIVE 06/27/2020 1658   NITRITE NEGATIVE 06/27/2020 1658   LEUKOCYTESUR NEGATIVE 06/27/2020 1658   Sepsis Labs Invalid input(s): PROCALCITONIN,  WBC,  LACTICIDVEN Microbiology Recent Results (from the past 240 hour(s))  MRSA PCR Screening     Status: None   Collection Time: 07/04/20 10:52 AM   Specimen: Nasal Mucosa; Nasopharyngeal  Result Value Ref Range Status   MRSA by PCR NEGATIVE NEGATIVE Final    Comment:        The GeneXpert MRSA Assay (FDA approved for NASAL specimens only), is one component of a comprehensive MRSA colonization surveillance program. It is not intended to diagnose MRSA infection nor to guide or monitor treatment for MRSA infections. Performed at [facility], [place]  [address] [place], [place] [address]   Culture, blood (Routine X 2) w Reflex to ID Panel     Status: None   Collection Time: 07/04/20 12:58 PM   Specimen: BLOOD  Result Value Ref Range Status   Specimen Description   Final    BLOOD RIGHT ANTECUBITAL Performed at [facility] [address] [place], [place] [address]    Special Requests   Final    BOTTLES DRAWN AEROBIC AND ANAEROBIC Blood Culture adequate volume Performed at [facility] [address] [place], [place] [address]    Culture   Final    NO GROWTH 6 DAYS Performed at [facility], [place] [address] [place], [place] [address]    Report Status 07/10/2020 FINAL  Final  Culture, blood (Routine X 2) w Reflex to ID Panel     Status: None   Collection Time: 07/04/20  1:09 PM   Specimen: BLOOD  Result Value Ref Range Status   Specimen Description   Final    BLOOD LEFT ANTECUBITAL Performed at [facility] [address] [place], [place] [address]    Special Requests   Final    BOTTLES DRAWN AEROBIC ONLY Blood Culture results may not be optimal due to an inadequate volume of blood received in culture bottles Performed at [facility] [address] [place], [place] [address]    Culture   Final    NO GROWTH 6 DAYS Performed at [facility], [place] [address], [place], [place] [address]    Report Status 07/10/2020 FINAL  Final     Time coordinating discharge: Over 30 minutes  SIGNED:   Shatiqua Heroux J [place] DO  [facility] 07/13/2020, 11:26 AM

## 2020-07-14 ENCOUNTER — Other Ambulatory Visit: Payer: Self-pay | Admitting: Neurology

## 2020-07-15 ENCOUNTER — Ambulatory Visit: Payer: Medicare Other | Admitting: Internal Medicine

## 2020-07-17 ENCOUNTER — Encounter: Payer: Self-pay | Admitting: Internal Medicine

## 2020-07-17 ENCOUNTER — Other Ambulatory Visit: Payer: Self-pay

## 2020-07-17 ENCOUNTER — Telehealth (INDEPENDENT_AMBULATORY_CARE_PROVIDER_SITE_OTHER): Payer: Medicare Other | Admitting: Internal Medicine

## 2020-07-17 DIAGNOSIS — K668 Other specified disorders of peritoneum: Secondary | ICD-10-CM

## 2020-07-17 DIAGNOSIS — K651 Peritoneal abscess: Secondary | ICD-10-CM | POA: Diagnosis not present

## 2020-07-17 NOTE — Progress Notes (Signed)
[facility]  Patient Active Problem List   Diagnosis Date Noted  . Metabolic encephalopathy   . Septic shock ([place]) 07/06/2020  . Acute respiratory failure with hypoxemia ([place]) 07/06/2020  . Pressure injury of skin 07/04/2020  . Post-ERCP acute pancreatitis 06/27/2020  . Pancreatitis 06/27/2020  . Abscess of gallbladder   . Migration of biliary stent   . Malnutrition of moderate degree 05/23/2020  . Abdominal pain 05/21/2020  . Biloma 05/20/2020  . Biliary sludge   . Encounter for removal of biliary stent   . Diabetic peripheral neuropathy ([place]) 09/14/2019  . Gait disturbance 09/14/2019  . NASH (nonalcoholic steatohepatitis) 08/21/2019  . Bile leak   . Other cirrhosis of liver ([place]) 08/04/2019  . DYSPHAGIA UNSPECIFIED 03/04/2009  . GERD 02/18/2009  . PERSISTENT VOMITING 02/18/2009  . CHEST PAIN 02/18/2009  . NAUSEA AND VOMITING 02/18/2009  . Dehydration 07/19/2007  . GASTROENTERITIS 07/19/2007  . FIBROIDS, UTERUS 05/02/2007  . DEPRESSION 05/02/2007  . MIGRAINE HEADACHE 05/02/2007  . Hypertension associated with diabetes ([place]) 05/02/2007  . HEMORRHOIDS 05/02/2007  . Chronic constipation 05/02/2007  . IBS 05/02/2007  . PSORIASIS 05/02/2007  . PITUITARY NEOPLASM, HX OF 05/02/2007  . LIVER FUNCTION TESTS, ABNORMAL, HX OF 05/02/2007  . Insulin dependent type 2 diabetes mellitus ([place]) 02/04/2007  . Hyperlipidemia associated with type 2 diabetes mellitus ([place]) 02/04/2007  . SINUSITIS, ACUTE 02/04/2007  . Abdominal pain, right upper quadrant 02/04/2007      Subjective:    Patient ID: Dian Queen, female    DOB: Sep 27, 1965, 55 y.o.   MRN: [id number]  No chief complaint on file.   HPI:  KIARA MCDOWELL is a 55 y.o. female pmh liver cirrhosis, insulin-dependent type 2 diabetes, hypertension, hyperlipidemia, anemia of chronic disease, depression/anxiety, recent lap chole 07/2019 for chronic cholecystitis complicated by persistent bile  leak s/p biliary stent, admitted 12/27 for right sided abd/flank pain found to have infected biloma. She is here in ID clinic for follow up  07/17/20 id f/u I verified that I was speaking with the correct person using two identifiers. Due to the COVID-19 Pandemic, this service was provided via telemedicine using audio/visual media.   The patient was located at home. The provider was located in the office. The patient did consent to this visit and is aware of charges through their insurance as well as the limitations of evaluation and management by telemedicine. Other persons participating in this telemedicine service were none.  I extended her abx to 2 more weeks from the last visit She unfortunately was over medicated with pain meds and was admitted after found down, deemed to have septic shock from aspiration pna. During this admission (at [facility] -- d/c'ed 2/19) she had her drain down sized. The ct scan didn't see any fluid collection though in the gall bladder fossa. She was given 1 week piptazo in the hospital. She reports ongoing drainage thought green fluid from the drain No n/v/diarrhea/rash Feels better since admission She has not yet seen GI   06/13/20 id f/u Some pain still stable (moderate) around the perc drain No f/c Eating well, gainin 20 pounds since discharge No diarrhea Drainage now clear straw color   Background from 05/30/20 consult by me: ------------------------  Review of chart: S/p subtotal cholecystectomy 0/1561 complicated by cystic duct stump leak ERCP 08/07/2019 with placement initial stent into CBD, along with sphinterotomy ERCP 11/07/2019 for stent exchange; persistent bile leak noted ERCP 05/13/2020  for persistent bile leak; previous stent clogged/removed and new metal stent placed in CBD  Post 12/20 procedure, patient developed progressive right flank/abd pain. She denies fever/chill, but endorsed nausea/poor appetite. Denies diarrhea. Denies  increased abd girth or weight gain/legs swelling  Seen by GI in clinic 05/20/2020. Previsit labs showed alk phos 244, AST 44, ALT 41, total bilirubin 0.3, lipase 101, WBC 6.2, hemoglobin 9.6. She was referred from gi clinic for admission to r/o complication of bile leak vs pancreatitis vs others   Hospital course: 12/27 admission: afebrile; hds Urinalysis positive nitrites, 0-5 RBC/hpf, 21-50 WBC, many bacteria on microscopy ucx ultimately klebsiella Wbc 6.2 CT abdomen/pelvis with contrast showed a multilocular fluid collection within the gallbladder bed measuring 4.6 x 2.6 cm which could be due to biloma or postprocedural seroma. No definite evidence of pancreatitis was seen. Findings suggestive of cirrhosis and portal hypertension also noted. Started empirically on piptazo for concern biliary sepsis 12/28 hida scan shows no evidence bile leak 12/30 s/p IR placement percutaneous drain catheter. The content does show bile and suggestive of persistent bile leak 01/03 ercp to remove metal stent which had migrated and placement of new plastic stent for ongoing bile leak  She never had fever here. And her wbc has been around 6's. The perc biliary drain is draining well   Allergies  Allergen Reactions  . Talwin [Pentazocine]     Turned blue around lips and face, rash all over body      Outpatient Medications Prior to Visit  Medication Sig Dispense Refill  . acetaminophen (TYLENOL) 500 MG tablet Take 1,000 mg by mouth every 6 (six) hours as needed for moderate pain.    . brexpiprazole (REXULTI) 2 MG TABS tablet Take 2 mg by mouth at bedtime.    . Dulaglutide 4.5 MG/0.5ML SOPN Inject 4.5 mg into the skin every Monday.    . Empagliflozin-metFORMIN HCl ER (SYNJARDY XR) 25-1000 MG TB24 Take 1 tablet by mouth daily before breakfast.     . ezetimibe (ZETIA) 10 MG tablet Take 10 mg by mouth daily.    . Insulin Glargine (BASAGLAR KWIKPEN) 100 UNIT/ML Inject 30 Units into the skin at  bedtime.    . lactulose (CHRONULAC) 10 GM/15ML solution Take 15 mLs (10 g total) by mouth daily. 236 mL 0  . meloxicam (MOBIC) 15 MG tablet Take 15 mg by mouth daily.    . metFORMIN (GLUCOPHAGE) 1000 MG tablet Take 1,000 mg by mouth at bedtime.    . Nutritional Supplements (NUTRITIONAL SHAKE HIGH PROTEIN PO) Take 237 mLs by mouth 2 (two) times daily.    . ondansetron (ZOFRAN-ODT) 4 MG disintegrating tablet Take 4 mg by mouth every 8 (eight) hours as needed for nausea or vomiting.    . pantoprazole (PROTONIX) 40 MG tablet Take 1 tablet (40 mg total) by mouth daily. 90 tablet 0  . prazosin (MINIPRESS) 2 MG capsule Take 4 mg by mouth daily.    . pregabalin (LYRICA) 100 MG capsule TAKE 1 CAPSULE BY MOUTH THREE TIMES A DAY DNF 0922 90 capsule 0  . QUEtiapine (SEROQUEL) 400 MG tablet Take 0.5 tablets (200 mg total) by mouth at bedtime.    . simvastatin (ZOCOR) 10 MG tablet Take 10 mg by mouth daily.  6  . sodium chloride flush (NS) 0.9 % SOLN Inject 5 mLs into the vein every 12 (twelve) hours. 900 mL 0  . topiramate (TOPAMAX) 200 MG tablet Take 200 mg by mouth 2 (two) times daily.    .   vortioxetine HBr ([name]) 20 MG TABS tablet Take 20 mg by mouth at bedtime.    . ziprasidone ([name]) 20 MG capsule Take 20 mg by mouth at bedtime.     No facility-administered medications prior to visit.     Social History   Socioeconomic History  . Marital status: Married    Spouse name: Not on file  . Number of children: Not on file  . Years of education: Not on file  . Highest education level: Not on file  Occupational History  . Not on file  Tobacco Use  . Smoking status: Never Smoker  . Smokeless tobacco: Never Used  Vaping Use  . Vaping Use: Never used  Substance and Sexual Activity  . Alcohol use: Never  . Drug use: Never  . Sexual activity: Not on file  Other Topics Concern  . Not on file  Social History Narrative  . Not on file   Social Determinants of Health   Financial Resource  Strain: Not on file  Food Insecurity: Not on file  Transportation Needs: Not on file  Physical Activity: Not on file  Stress: Not on file  Social Connections: Not on file  Intimate Partner Violence: Not on file      Review of Systems   other ros negative  Objective:    There were no vitals taken for this visit. Nursing note and vital signs reviewed.  Physical Exam     Phone visit 07/17/20    Labs: 2/19 cr 0.3; lft wnl; alb 2.6; cbc 5/8/237  Serology: hiv screen negative  Micro: 2/10 bcx negative  12/27 ucx kleb pna (r amp, pansensitive otherwise) 12/30 perc drain fluid rare e faecalis  Imaging: 12/27 ct abd/pelv with contrast I personally reviewed and noted fluid collection around previously where gallbladder bed was. I also noted hyperintense tubular structure ending in the duodenum from cbd  Multilocular fluid collection within the gallbladder bed measuring 4.6 x 2.6 cm which could be due to biloma, or postprocedural seroma. Mild inflammatory changes seen within the porta hepatis No definite evidence of pancreatitis Findings suggestive of cirrhosis and portal hypertension  12/28 hida scan No evidence bile leak  1/01 mra 1. Status post cholecystectomy with percutaneous drainage catheter in the gallbladder fossa, without residual fluid collection around the catheter tip. 2. Hepatic cirrhosis with developing hepatic fibrosis, most confluent in the right lobe of the liver.  1/03 ercp 1. Removal of existing metallic biliary stent, balloon sweeping of the bile duct and placement of a plastic biliary stent.  07/04/20 abd ct Reviewed 1. Interval development of bibasilar airspace disease, which could reflect aspiration or infection. 2. Percutaneous drainage catheter within the gallbladder fossa, with no evidence of recurrence or residual fluid collection. 3. Downstream migration of the 4 biliary stents, traversing the distal common bile duct and  ampulla. The stents remain patent, with mild pneumobilia again noted. 4. Increasing fecal retention consistent with constipation. Developing bowel wall thickening of the distal transverse colon could reflect inflammatory, infectious, or stercoral colitis. 5. Cirrhosis. 6. No evidence of acute hemorrhage to explain the patient's anemia   Assessment & Plan:   Problem List Items Addressed This Visit      Other   Biloma    Other Visit Diagnoses    Intra-abdominal abscess ([place])    -  Primary     Abx: 1/06-2/04 amox-clav 1/02-6 amp-sulbactam 12/28 and 12/30 fluconazole 12/27-1/02 pip-tazo 12/28 nitrofurantoin  Assessment: 54 y.o. female pmh liver cirrhosis (presumed nash), insulin-dependent type 2 diabetes, hypertension, hyperlipidemia, anemia of chronic disease, depression/anxiety, recent lap chole 07/2019 for chronic cholecystitis complicated by persistent bile leak s/p biliary stent, admitted 12/27 for right sided abd/flank pain found to have infected biloma  No evidence peritonitis Patient is now s/p perc drain placement on 12/30; cx rare efaecalis. No sign of sepsis She has what appears to be assymptomatic bacteriuria On 1/03 had ercp with replacement of migrated stent  At this point she appears to have source control,  However this is not cholecystitis per say but rather an "abscess in the gallbladder fossa." Would finish 14 day course abx. Oral abx would be fine. She'll need close f/u and repeat imaging with GI outpatient  06/13/20 id f/u Appears to be doing better Reviewed abd ct from 1/14, minimal if at all any fluid around where the drain is. No abx complication. Will have take 2 more weeks of abx and discuss with gi to see if ongoing leakage around gall bladder fossa  07/17/20 id f/u Ct scan 2/10 no fluid collection further, however still draining. I query ongoing bile leak. As she is doing ok off abx, it suggest  she doesn't have any active infection/abdscess. Will continue to monitor off abx. I advise she'll need to see gi regarding evaluating for ongoing leak. I will see her in 2-3 weeks for face to face visit   -no abx -f/u gi -f/u with me 2-3 weeks for clinical evaluation; or sooner if f/c, decreased appetite, confusion.     Follow-up: Return in about 3 weeks (around 08/07/2020).   I spent more than 20 minute reviewing data/chart, and coordinating care and >50% on the phone time providing counseling/discussing diagnostics/treatment plan with patient     T , MD [facility] -- -- pager   [phone] cell 07/17/2020, 4:29 PM  

## 2020-07-22 ENCOUNTER — Other Ambulatory Visit (HOSPITAL_COMMUNITY): Payer: Medicare Other

## 2020-07-25 ENCOUNTER — Ambulatory Visit (HOSPITAL_COMMUNITY): Admit: 2020-07-25 | Payer: Medicare Other | Admitting: Gastroenterology

## 2020-07-25 ENCOUNTER — Encounter (HOSPITAL_COMMUNITY): Payer: Self-pay

## 2020-07-25 SURGERY — ENDOSCOPIC RETROGRADE CHOLANGIOPANCREATOGRAPHY (ERCP) WITH PROPOFOL
Anesthesia: General

## 2020-08-05 ENCOUNTER — Other Ambulatory Visit: Payer: Self-pay | Admitting: Cardiovascular Disease

## 2020-08-05 ENCOUNTER — Encounter: Payer: Self-pay | Admitting: Internal Medicine

## 2020-08-05 ENCOUNTER — Other Ambulatory Visit: Payer: Self-pay

## 2020-08-05 ENCOUNTER — Ambulatory Visit (INDEPENDENT_AMBULATORY_CARE_PROVIDER_SITE_OTHER): Payer: Medicare Other | Admitting: Internal Medicine

## 2020-08-05 VITALS — BP 114/71 | HR 96 | Temp 97.8°F | Resp 16 | Ht 64.0 in | Wt 167.0 lb

## 2020-08-05 DIAGNOSIS — T8189XD Other complications of procedures, not elsewhere classified, subsequent encounter: Secondary | ICD-10-CM

## 2020-08-05 DIAGNOSIS — K9189 Other postprocedural complications and disorders of digestive system: Secondary | ICD-10-CM

## 2020-08-05 DIAGNOSIS — K651 Peritoneal abscess: Secondary | ICD-10-CM

## 2020-08-05 DIAGNOSIS — K668 Other specified disorders of peritoneum: Secondary | ICD-10-CM

## 2020-08-05 NOTE — Progress Notes (Signed)
[facility]  Patient Active Problem List   Diagnosis Date Noted  . Metabolic encephalopathy   . Septic shock ([place]) 07/06/2020  . Acute respiratory failure with hypoxemia ([place]) 07/06/2020  . Pressure injury of skin 07/04/2020  . Post-ERCP acute pancreatitis 06/27/2020  . Pancreatitis 06/27/2020  . Abscess of gallbladder   . Migration of biliary stent   . Malnutrition of moderate degree 05/23/2020  . Abdominal pain 05/21/2020  . Biloma 05/20/2020  . Biliary sludge   . Complicated UTI (urinary tract infection) 03/11/2020  . Encounter for removal of biliary stent   . Diabetic peripheral neuropathy ([place]) 09/14/2019  . Gait disturbance 09/14/2019  . NASH (nonalcoholic steatohepatitis) 08/21/2019  . Bile leak   . Other cirrhosis of liver ([place]) 08/04/2019  . Major depressive disorder, recurrent episode, severe, with psychotic behavior ([place]) 03/25/2011  . DYSPHAGIA UNSPECIFIED 03/04/2009  . GERD 02/18/2009  . PERSISTENT VOMITING 02/18/2009  . CHEST PAIN 02/18/2009  . NAUSEA AND VOMITING 02/18/2009  . Dehydration 07/19/2007  . GASTROENTERITIS 07/19/2007  . FIBROIDS, UTERUS 05/02/2007  . DEPRESSION 05/02/2007  . MIGRAINE HEADACHE 05/02/2007  . Hypertension associated with diabetes ([place]) 05/02/2007  . HEMORRHOIDS 05/02/2007  . Chronic constipation 05/02/2007  . IBS 05/02/2007  . PSORIASIS 05/02/2007  . PITUITARY NEOPLASM, HX OF 05/02/2007  . LIVER FUNCTION TESTS, ABNORMAL, HX OF 05/02/2007  . Insulin dependent type 2 diabetes mellitus ([place]) 02/04/2007  . Hyperlipidemia associated with type 2 diabetes mellitus ([place]) 02/04/2007  . SINUSITIS, ACUTE 02/04/2007  . Abdominal pain, right upper quadrant 02/04/2007  . Hyperlipidemia 02/04/2007      Subjective:    Patient ID: Kim Hicks, female    DOB: Oct 12, 1965, 55 y.o.   MRN: [id number]  Chief Complaint  Patient presents with  . Follow-up    3 week follow up     HPI:  Kim Hicks is  a 55 y.o. female pmh liver cirrhosis, insulin-dependent type 2 diabetes, hypertension, hyperlipidemia, anemia of chronic disease, depression/anxiety, recent lap chole 07/2019 for chronic cholecystitis complicated by persistent bile leak s/p biliary stent, admitted 12/27 for right sided abd/flank pain found to have infected biloma. She is here in ID clinic for follow up   08/05/2020 id f/u Inpatient visit She haven't seen GI yet since we spoke about a month ago. She remains off abx since 2/04 I reviewed future appointments and it seems she has a gi appointment on 08/09/2020  Her RUQ bag is still with output yellow/greenish non-viscous fluid. She changes bag 4-5 times a day; the bag is 600 cc in total. She changes when its around 100 cc. She haven't seen IR again either No fever, chill Eating well No diarrhea Pain still at the insertion site.   07/17/20 id f/u I verified that I was speaking with the correct person using two identifiers. Due to the COVID-19 Pandemic, this service was provided via telemedicine using audio/visual media.   The patient was located at home. The provider was located in the office. The patient did consent to this visit and is aware of charges through their insurance as well as the limitations of evaluation and management by telemedicine. Other persons participating in this telemedicine service were none.  I extended her abx to 2 more weeks from the last visit She unfortunately was over medicated with pain meds and was admitted after found down, deemed to have septic shock from aspiration pna. During this admission (at [place] --  d/c'ed 2/19) she had her drain down sized. The ct scan didn't see any fluid collection though in the gall bladder fossa. She was given 1 week piptazo in the hospital. She reports ongoing drainage thought green fluid from the drain No n/v/diarrhea/rash Feels better since admission She has not yet seen GI   06/13/20 id f/u Some pain still  stable (moderate) around the perc drain No f/c Eating well, gainin 20 pounds since discharge No diarrhea Drainage now clear straw color   Background from 05/30/20 consult by me: ------------------------  Review of chart: S/p subtotal cholecystectomy 10/9483 complicated by cystic duct stump leak ERCP 08/07/2019 with placement initial stent into CBD, along with sphinterotomy ERCP 11/07/2019 for stent exchange; persistent bile leak noted ERCP 05/13/2020 for persistent bile leak; previous stent clogged/removed and new metal stent placed in CBD  Post 12/20 procedure, patient developed progressive right flank/abd pain. She denies fever/chill, but endorsed nausea/poor appetite. Denies diarrhea. Denies increased abd girth or weight gain/legs swelling  Seen by GI in clinic 05/20/2020. Previsit labs showed alk phos 244, AST 44, ALT 41, total bilirubin 0.3, lipase 101, WBC 6.2, hemoglobin 9.6. She was referred from gi clinic for admission to r/o complication of bile leak vs pancreatitis vs others   Hospital course: 12/27 admission: afebrile; hds Urinalysis positive nitrites, 0-5 RBC/hpf, 21-50 WBC, many bacteria on microscopy ucx ultimately klebsiella Wbc 6.2 CT abdomen/pelvis with contrast showed a multilocular fluid collection within the gallbladder bed measuring 4.6 x 2.6 cm which could be due to biloma or postprocedural seroma. No definite evidence of pancreatitis was seen. Findings suggestive of cirrhosis and portal hypertension also noted. Started empirically on piptazo for concern biliary sepsis 12/28 hida scan shows no evidence bile leak 12/30 s/p IR placement percutaneous drain catheter. The content does show bile and suggestive of persistent bile leak 01/03 ercp to remove metal stent which had migrated and placement of new plastic stent for ongoing bile leak  She never had fever here. And her wbc has been around 6's. The perc biliary drain is draining well   Allergies   Allergen Reactions  . Talwin [Pentazocine]     Turned blue around lips and face, rash all over body      Outpatient Medications Prior to Visit  Medication Sig Dispense Refill  . acetaminophen (TYLENOL) 500 MG tablet Take 1,000 mg by mouth every 6 (six) hours as needed for moderate pain.    . brexpiprazole (REXULTI) 2 MG TABS tablet Take 2 mg by mouth at bedtime.    . Dulaglutide 4.5 MG/0.5ML SOPN Inject 4.5 mg into the skin every Monday.    . Empagliflozin-metFORMIN HCl ER (SYNJARDY XR) 25-1000 MG TB24 Take 1 tablet by mouth daily before breakfast.     . ezetimibe (ZETIA) 10 MG tablet Take 10 mg by mouth daily.    . Insulin Glargine (BASAGLAR KWIKPEN) 100 UNIT/ML Inject 30 Units into the skin at bedtime.    [name] lactulose (CHRONULAC) 10 GM/15ML solution Take 15 mLs (10 g total) by mouth daily. 236 mL 0  . meloxicam (MOBIC) 15 MG tablet Take 15 mg by mouth daily.    . metFORMIN (GLUCOPHAGE) 1000 MG tablet Take 1,000 mg by mouth at bedtime.    . Nutritional Supplements (NUTRITIONAL SHAKE HIGH PROTEIN PO) Take 237 mLs by mouth 2 (two) times daily.    . ondansetron (ZOFRAN-ODT) 4 MG disintegrating tablet Take 4 mg by mouth every 8 (eight) hours as needed for nausea or vomiting.    [name]  pantoprazole (PROTONIX) 40 MG tablet Take 1 tablet (40 mg total) by mouth daily. 90 tablet 0  . prazosin (MINIPRESS) 2 MG capsule Take 4 mg by mouth daily.    . pregabalin (LYRICA) 100 MG capsule TAKE 1 CAPSULE BY MOUTH THREE TIMES A DAY DNF 0922 90 capsule 0  . QUEtiapine (SEROQUEL) 400 MG tablet Take 0.5 tablets (200 mg total) by mouth at bedtime.    . simvastatin (ZOCOR) 10 MG tablet Take 10 mg by mouth daily.  6  . sodium chloride flush (NS) 0.9 % SOLN Inject 5 mLs into the vein every 12 (twelve) hours. 900 mL 0  . topiramate (TOPAMAX) 200 MG tablet Take 200 mg by mouth 2 (two) times daily.    [name] vortioxetine HBr ([name]) 20 MG TABS tablet Take 20 mg by mouth at bedtime.    . ziprasidone ([name]) 20 MG  capsule Take 20 mg by mouth at bedtime.     No facility-administered medications prior to visit.     Social History   Socioeconomic History  . Marital status: Married    Spouse name: Not on file  . Number of children: Not on file  . Years of education: Not on file  . Highest education level: Not on file  Occupational History  . Not on file  Tobacco Use  . Smoking status: Never Smoker  . Smokeless tobacco: Never Used  Vaping Use  . Vaping Use: Never used  Substance and Sexual Activity  . Alcohol use: Never  . Drug use: Never  . Sexual activity: Not on file  Other Topics Concern  . Not on file  Social History Narrative  . Not on file   Social Determinants of Health   Financial Resource Strain: Not on file  Food Insecurity: Not on file  Transportation Needs: Not on file  Physical Activity: Not on file  Stress: Not on file  Social Connections: Not on file  Intimate Partner Violence: Not on file      Review of Systems   other ros negative  Objective:    Resp 16   Ht 5' 4"  (1.626 m)   Wt 167 lb (75.8 kg)   BMI 28.67 kg/m  Nursing note and vital signs reviewed.  Physical Exam Obese, no distress, pleasant Atraumatic; conj clear cv rrr no mrg Lungs clear abd soft, obese, mildly tender around perc cath with mild fibrinous changes. No erythema/fluctuance/purulence. The driainage bag is showing straw/green color fluid Neuro nonfocal Ext no edema  Labs: 2/19 cr 0.3; lft wnl; alb 2.6; cbc 5/8/237  Crp: 06/29/20    0.6 06/13/20    3.3  Serology: hiv screen negative  Micro: 2/10 bcx negative  12/27 ucx kleb pna (r amp, pansensitive otherwise) 12/30 perc drain fluid rare e faecalis  Imaging: 12/27 ct abd/pelv with contrast I personally reviewed and noted fluid collection around previously where gallbladder bed was. I also noted hyperintense tubular structure ending in the duodenum from cbd  Multilocular fluid collection within the gallbladder bed  measuring 4.6 x 2.6 cm which could be due to biloma, or postprocedural seroma. Mild inflammatory changes seen within the porta hepatis No definite evidence of pancreatitis Findings suggestive of cirrhosis and portal hypertension  12/28 hida scan No evidence bile leak  1/01 mra 1. Status post cholecystectomy with percutaneous drainage catheter in the gallbladder fossa, without residual fluid collection around the catheter tip. 2. Hepatic cirrhosis with developing hepatic fibrosis, most confluent in the right lobe of the liver.  1/03 ercp 1. Removal of existing metallic biliary stent, balloon sweeping of the bile duct and placement of a plastic biliary stent.  07/04/20 abd ct Reviewed 1. Interval development of bibasilar airspace disease, which could reflect aspiration or infection. 2. Percutaneous drainage catheter within the gallbladder fossa, with no evidence of recurrence or residual fluid collection. 3. Downstream migration of the 4 biliary stents, traversing the distal common bile duct and ampulla. The stents remain patent, with mild pneumobilia again noted. 4. Increasing fecal retention consistent with constipation. Developing bowel wall thickening of the distal transverse colon could reflect inflammatory, infectious, or stercoral colitis. 5. Cirrhosis. 6. No evidence of acute hemorrhage to explain the patient's anemia   Assessment & Plan:   Problem List Items Addressed This Visit   None    Abx: 1/06-2/04 amox-clav 1/02-6 amp-sulbactam 12/28 and 12/30 fluconazole 12/27-1/02 pip-tazo 12/28 nitrofurantoin                                                        Assessment: 55 y.o. female pmh liver cirrhosis (presumed nash), insulin-dependent type 2 diabetes, hypertension, hyperlipidemia, anemia of chronic disease, depression/anxiety, recent lap chole 07/2019 for chronic cholecystitis complicated by persistent bile leak s/p biliary stent, admitted 12/27 for  right sided abd/flank pain found to have infected biloma  No evidence peritonitis Patient is now s/p perc drain placement on 12/30; cx rare efaecalis. No sign of sepsis She has what appears to be assymptomatic bacteriuria On 1/03 had ercp with replacement of migrated stent  At this point she appears to have source control,  However this is not cholecystitis per say but rather an "abscess in the gallbladder fossa." Would finish 14 day course abx. Oral abx would be fine. She'll need close f/u and repeat imaging with GI outpatient  06/13/20 id f/u Appears to be doing better Reviewed abd ct from 1/14, minimal if at all any fluid around where the drain is. No abx complication. Will have take 2 more weeks of abx and discuss with gi to see if ongoing leakage around gall bladder fossa  07/17/20 id f/u Ct scan 2/10 no fluid collection further, however still draining. I query ongoing bile leak. As she is doing ok off abx, it suggest she doesn't have any active infection/abdscess. Will continue to monitor off abx. I advise she'll need to see gi regarding evaluating for ongoing leak. I will see her in 2-3 weeks for face to face visit  3/14 id f/u She continues to do well without evidence sepsis or smoldering intraabd infection/biloma infection since stopping abx and with good drainage out of the perc drain. At some point, the biloma should be fixed or the perc drain should be internalized to reduce risk of infection  She should f/u gi and discuss this  No indication for abx. The insertion site is irritated as expected but doesn't appear to have SSI either   -no abx indicated -f/u gi -f/u with me as needed if sign of infection-- f/c, decreased appetite, confusion, increased ruq pain  I spent more than 25 minute reviewing data/chart, and coordinating care and >50% direct face to face time providing counseling/discussing diagnostics/treatment plan with patient      Follow-up: Return if symptoms  worsen or fail to improve.   I spent more than 20 minute reviewing data/chart, and  coordinating care and >50% on the phone time providing counseling/discussing diagnostics/treatment plan with patient    Jabier Mutton, [facility] [place] -- -- pager   [phone] cell 08/05/2020, 2:35 PM

## 2020-08-05 NOTE — Patient Instructions (Signed)
You seem to have ongoing bile leak given the drain's output  At this time however, it doesn't appear the biloma is infected. The insertion site is irritated but doesn't appear infected either  If you have increased persistent pain and more redness and more pus coming out of the inerstion site, please let us know   Please follow up with GI to manage what seems to be ongoing bile leak and see if your drain can be removed

## 2020-08-06 LAB — CBC
HCT: 32.7 % — ABNORMAL LOW (ref 35.0–45.0)
Hemoglobin: 9.7 g/dL — ABNORMAL LOW (ref 11.7–15.5)
MCH: 23.8 pg — ABNORMAL LOW (ref 27.0–33.0)
MCHC: 29.7 g/dL — ABNORMAL LOW (ref 32.0–36.0)
MCV: 80.3 fL (ref 80.0–100.0)
MPV: 10.4 fL (ref 7.5–12.5)
Platelets: 212 10*3/uL (ref 140–400)
RBC: 4.07 10*6/uL (ref 3.80–5.10)
RDW: 15.7 % — ABNORMAL HIGH (ref 11.0–15.0)
WBC: 7.6 10*3/uL (ref 3.8–10.8)

## 2020-08-06 LAB — C-REACTIVE PROTEIN: CRP: 14.4 mg/L — ABNORMAL HIGH (ref <8.0)

## 2020-08-07 ENCOUNTER — Encounter: Payer: Self-pay | Admitting: Neurology

## 2020-08-07 ENCOUNTER — Ambulatory Visit (INDEPENDENT_AMBULATORY_CARE_PROVIDER_SITE_OTHER): Payer: Medicare Other | Admitting: Neurology

## 2020-08-07 VITALS — BP 106/68 | HR 82 | Ht 65.0 in | Wt 165.0 lb

## 2020-08-07 DIAGNOSIS — R269 Unspecified abnormalities of gait and mobility: Secondary | ICD-10-CM | POA: Diagnosis not present

## 2020-08-07 DIAGNOSIS — G43019 Migraine without aura, intractable, without status migrainosus: Secondary | ICD-10-CM | POA: Diagnosis not present

## 2020-08-07 DIAGNOSIS — E1142 Type 2 diabetes mellitus with diabetic polyneuropathy: Secondary | ICD-10-CM | POA: Diagnosis not present

## 2020-08-07 HISTORY — DX: Migraine without aura, intractable, without status migrainosus: G43.019

## 2020-08-07 MED ORDER — PREGABALIN 100 MG PO CAPS: ORAL_CAPSULE | ORAL | 1 refills | Status: DC

## 2020-08-07 MED ORDER — AIMOVIG 140 MG/ML ~~LOC~~ SOAJ: 140.0000 mg | SUBCUTANEOUS | 4 refills | Status: DC

## 2020-08-07 NOTE — Progress Notes (Signed)
Reason for visit: Diabetic peripheral neuropathy, migraine headache  Kim Hicks is an 55 y.o. female  History of present illness:  Kim Hicks is a 55 year old right-handed white female with a history of a significant diabetic peripheral neuropathy.  The patient has some gait instability associated with this, she does not use a cane or a walker for ambulation but she will fall on occasion.  She recent was in the hospital on 27 June 2020 with pancreatitis and was in the intensive care unit for several days.  She developed a significant metabolic encephalopathy.  The patient is now on lactulose, she does have cirrhosis of the liver.  The patient has felt somewhat weaker with the legs since being in the hospital.  She also reports that she continues to have migraine headaches, she is on Topamax for this.  She has about 2 headaches a month but the headaches last 2 to 3 days so she may have anywhere from 4-6 headache days a month.  The patient is under a lot of stress as her niece who she is caring for is in the hospital after attempting suicide.  The patient is on Lyrica taking 100 mg 3 times daily, this does not control her discomfort, she is not sleeping well because of the neuropathy pain.  Past Medical History:  Diagnosis Date  . Anxiety   . Cataract   . Cirrhosis of liver ([place])   . Depression   . Diabetes mellitus without complication ([place])   . Dysrhythmia   . Edema   . Family history of adverse reaction to anesthesia    mother had trouble waking up after surgery  . Foot fracture, left   . Headache    MIGRAINES  . Hyperlipidemia   . Hypertension   . Neuromuscular disorder ([place])    NEUROPATHY-both hands and feet  . Palpitations   . Wheezing     Past Surgical History:  Procedure Laterality Date  . ABDOMINAL HYSTERECTOMY     one ovary left  . BILIARY STENT PLACEMENT N/A 08/07/2019   Procedure: BILIARY STENT PLACEMENT;  Surgeon: [name], MD;  [facility]: [facility];   Service: Endoscopy;  Laterality: N/A;  . BILIARY STENT PLACEMENT N/A 11/07/2019   Procedure: BILIARY STENT PLACEMENT;  Surgeon: [name], MD;  [facility];  Service: Endoscopy;  Laterality: N/A;  . BILIARY STENT PLACEMENT N/A 05/13/2020   Procedure: BILIARY STENT PLACEMENT;  Surgeon: [name], MD;  [facility];  Service: Endoscopy;  Laterality: N/A;  . BILIARY STENT PLACEMENT N/A 05/27/2020   Procedure: BILIARY STENT PLACEMENT;  Surgeon: [name], MD;  [facility];  Service: Endoscopy;  Laterality: N/A;  . BILIARY STENT PLACEMENT N/A 06/26/2020   Procedure: BILIARY STENT PLACEMENT;  Surgeon: [name], MD;  [facility];  Service: Gastroenterology;  Laterality: N/A;  . BIOPSY  06/26/2020   Procedure: BIOPSY;  Surgeon: [name], MD;  Location: [facility];  Service: Gastroenterology;;  . CATARACT EXTRACTION W/PHACO Right 01/11/2018   Procedure: CATARACT EXTRACTION PHACO AND INTRAOCULAR LENS PLACEMENT ([place]);  Surgeon: [name], MD;  Location: [facility];  Service: Ophthalmology;  Laterality: Right;  [place] 00:57.5 AP% 15.0 CDE 8.61 Fluid Pack Lot # [id number]  . CATARACT EXTRACTION W/PHACO Left 02/15/2018   Procedure: CATARACT EXTRACTION PHACO AND INTRAOCULAR LENS PLACEMENT (I[facility]);  Surgeon: [name], MD;  Location: [facility];  Service: Ophthalmology;  Laterality: Left;  [place] 00:51 AP% 13.2 CDE 6.82  Fluid p ack lot # [id number]  . CHOLECYSTECTOMY N/A 08/04/2019   Procedure: LAPAROSCOPIC CHOLECYSTECTOMY;  Surgeon: [name], MD;  [facility];  Service: General;  Laterality: N/A;  . COLONOSCOPY    . ENDOSCOPIC RETROGRADE CHOLANGIOPANCREATOGRAPHY (ERCP) WITH PROPOFOL N/A 11/07/2019   Procedure: ENDOSCOPIC RETROGRADE CHOLANGIOPANCREATOGRAPHY (ERCP) WITH PROPOFOL;  Surgeon: [name], MD;  [facility];  Service: Endoscopy;  Laterality: N/A;  . ENDOSCOPIC RETROGRADE  CHOLANGIOPANCREATOGRAPHY (ERCP) WITH PROPOFOL N/A 05/13/2020   Procedure: ENDOSCOPIC RETROGRADE CHOLANGIOPANCREATOGRAPHY (ERCP) WITH PROPOFOL;  Surgeon: [name], MD;  [facility];  Service: Endoscopy;  Laterality: N/A;  . ENDOSCOPIC RETROGRADE CHOLANGIOPANCREATOGRAPHY (ERCP) WITH PROPOFOL N/A 06/26/2020   Procedure: ENDOSCOPIC RETROGRADE CHOLANGIOPANCREATOGRAPHY (ERCP) WITH PROPOFOL;  Surgeon: [name], MD;  [facility];  Service: Gastroenterology;  Laterality: N/A;  . ERCP N/A 08/07/2019   Procedure: ENDOSCOPIC RETROGRADE CHOLANGIOPANCREATOGRAPHY (ERCP);  Surgeon: [name], MD;  Location: [facility];  Service: Endoscopy;  Laterality: N/A;  . ERCP N/A 05/27/2020   Procedure: ENDOSCOPIC RETROGRADE CHOLANGIOPANCREATOGRAPHY (ERCP);  Surgeon: [name], MD;  Location: [facility];  Service: Endoscopy;  Laterality: N/A;  . ESOPHAGOGASTRODUODENOSCOPY N/A 06/26/2020   Procedure: ESOPHAGOGASTRODUODENOSCOPY (EGD);  Surgeon: [name]., MD;  Location: [facility];  Service: Gastroenterology;  Laterality: N/A;  . IR EXCHANGE BILIARY DRAIN  07/10/2020  . IR RADIOLOGIST EVAL & MGMT  06/20/2020  . RCR Bilateral   . REMOVAL OF STONES  08/07/2019   Procedure: REMOVAL OF STONES;  Surgeon: [name], MD;  [facility];  Service: Endoscopy;;  balloon sweep, no stones  . REMOVAL OF STONES  05/27/2020   Procedure: REMOVAL OF STONES;  Surgeon: [name], MD;  [facility];  Service: Endoscopy;;  . REMOVAL OF STONES  06/26/2020   Procedure: REMOVAL OF STONES;  Surgeon: [name]., MD;  Location: [facility];  Service: Gastroenterology;;  . [name]  08/07/2019   Procedure: SPHINCTEROTOMY;  Surgeon: [name], MD;  [facility];  Service: Endoscopy;;  . STENT REMOVAL  11/07/2019   Procedure: STENT REMOVAL;  Surgeon: [name], MD;  [facility];  Service: Endoscopy;;  .  STENT REMOVAL  05/13/2020   Procedure: STENT REMOVAL;  Surgeon: [name], MD;  [facility];  Service: Endoscopy;;  . STENT REMOVAL  05/27/2020   Procedure: STENT REMOVAL;  Surgeon: [name], MD;  [facility];  Service: Endoscopy;;  . STENT REMOVAL  06/26/2020   Procedure: STENT REMOVAL;  Surgeon: [name]., MD;  [facility];  Service: Gastroenterology;;    Family History  Problem Relation Age of Onset  . Diabetes Mother   . Heart failure Mother   . Depression Mother   . Colon cancer Neg Hx   . Stomach cancer Neg Hx   . Esophageal cancer Neg Hx   . Pancreatic cancer Neg Hx     Social history:  reports that she has never smoked. She has never used smokeless tobacco. She reports that she does not drink alcohol and does not use drugs.    Allergies  Allergen Reactions  . Talwin [Pentazocine]     Turned blue around lips and face, rash all over body    Medications:  Prior to Admission medications   Medication Sig Start Date End Date Taking? Authorizing Provider  acetaminophen (TYLENOL) 500 MG tablet Take 1,000 mg by mouth every 6 (six) hours as needed for moderate pain.  Yes [provider]  brexpiprazole (REXULTI) 2 MG TABS tablet Take 2 mg by mouth at bedtime.   Yes [provider]  Dulaglutide 4.5 MG/0.5ML SOPN Inject 4.5 mg into the skin every Monday. 12/20/18  Yes [provider]  Empagliflozin-metFORMIN HCl ER (SYNJARDY XR) 25-1000 MG TB24 Take 1 tablet by mouth daily before breakfast.  11/15/18  Yes [provider]  ezetimibe (ZETIA) 10 MG tablet Take 10 mg by mouth daily. 04/25/20  Yes [provider]  Insulin Glargine (BASAGLAR KWIKPEN) 100 UNIT/ML Inject 30 Units into the skin at bedtime. 03/20/20  Yes [provider]  lactulose (CHRONULAC) 10 GM/15ML solution Take 15 mLs (10 g total) by mouth daily. 07/14/20  Yes [place] Eric J, DO  meloxicam (MOBIC) 15 MG tablet  Take 15 mg by mouth daily.   Yes [provider]  metFORMIN (GLUCOPHAGE) 1000 MG tablet Take 1,000 mg by mouth at bedtime. 02/27/20  Yes [provider]  Nutritional Supplements (NUTRITIONAL SHAKE HIGH PROTEIN PO) Take 237 mLs by mouth 2 (two) times daily.   Yes [provider]  ondansetron (ZOFRAN-ODT) 4 MG disintegrating tablet Take 4 mg by mouth every 8 (eight) hours as needed for nausea or vomiting.   Yes [provider]  pantoprazole (PROTONIX) 40 MG tablet Take 1 tablet (40 mg total) by mouth daily. 07/13/20 10/11/20 Yes [place] Eric J, DO  prazosin (MINIPRESS) 2 MG capsule Take 4 mg by mouth daily.   Yes [provider]  pregabalin (LYRICA) 100 MG capsule TAKE 1 CAPSULE BY MOUTH THREE TIMES A DAY [id number] 07/17/20  Yes [name], NP  QUEtiapine (SEROQUEL) 400 MG tablet Take 0.5 tablets (200 mg total) by mouth at bedtime. 07/13/20  Yes [place] Eric J, DO  simvastatin (ZOCOR) 10 MG tablet Take 10 mg by mouth daily. 12/13/17  Yes [provider]  sodium chloride flush (NS) 0.9 % SOLN Inject 5 mLs into the vein every 12 (twelve) hours. 07/13/20 10/11/20 Yes [place] Eric J, DO  topiramate (TOPAMAX) 200 MG tablet Take 200 mg by mouth 2 (two) times daily. 04/12/20  Yes [provider]  vortioxetine HBr ([name]) 20 MG TABS tablet Take 20 mg by mouth at bedtime.   Yes [provider]  ziprasidone ([name]) 20 MG capsule Take 20 mg by mouth at bedtime.   Yes [provider]    ROS:  Out of a complete 14 system review of symptoms, the patient complains only of the following symptoms, and all other reviewed systems are negative.  Walking difficulty Insomnia Headache  Blood pressure 106/68, pulse 82, height 5' 5"  (1.651 m), weight 165 lb (74.8 kg).  Physical Exam  General: The patient is alert and cooperative at the time of the examination.  Skin: No significant peripheral edema is noted.   Neurologic  Exam  Mental status: The patient is alert and oriented x 3 at the time of the examination. The patient has apparent normal recent and remote memory, with an apparently normal attention span and concentration ability.   Cranial nerves: Facial symmetry is present. Speech is normal, no aphasia or dysarthria is noted. Extraocular movements are full. Visual fields are full.  Motor: The patient has good strength in all 4 extremities, with exception of some weakness of intrinsic muscles bilaterally, atrophy of the first dorsal interossei bilaterally.  Sensory examination: Soft touch sensation is symmetric on the face, arms, and legs.  Coordination: The patient has good finger-nose-finger and heel-to-shin bilaterally.  Gait and station: The patient has a slightly wide-based gait. Tandem gait is was not tested. Romberg is negative. No drift is seen.  Reflexes: Deep tendon reflexes are symmetric, but are decreased.   Assessment/Plan:  1.  Peripheral neuropathy, diabetic  2.  Mild gait disorder  3.  Intractable migraine headache  The patient continues to have frequent headaches, she is on Topamax and Seroquel without benefit.  She also is on Lyrica and this has not helped her headache.  We will try Aimovig taking 140 mg every 30 days.  The patient will go up on the Lyrica taking 100 mg twice during the day and 200 mg at night.  She will call for any dose adjustments.  She will follow up here in 6 months.  Jill Alexanders MD 08/07/2020 10:44 AM  [facility] [address] [place], [place] [address]  Phone [phone] Fax [phone]

## 2020-08-07 NOTE — Patient Instructions (Signed)
We will go up to on the lyrica to 100 mg twice during the day and 2 at night.  We will start [place] for headache prevention.

## 2020-08-08 ENCOUNTER — Telehealth: Payer: Self-pay | Admitting: Emergency Medicine

## 2020-08-08 NOTE — Telephone Encounter (Signed)
PA for Aimovig started on CMM Key: [id number] Awaiting determination from [facility]

## 2020-08-08 NOTE — Telephone Encounter (Signed)
This approval authorizes your coverage from 05/25/2020 - 11/06/2020

## 2020-08-09 ENCOUNTER — Other Ambulatory Visit: Payer: Self-pay

## 2020-08-09 ENCOUNTER — Other Ambulatory Visit (INDEPENDENT_AMBULATORY_CARE_PROVIDER_SITE_OTHER): Payer: Medicare Other

## 2020-08-09 ENCOUNTER — Encounter: Payer: Self-pay | Admitting: Nurse Practitioner

## 2020-08-09 ENCOUNTER — Ambulatory Visit (INDEPENDENT_AMBULATORY_CARE_PROVIDER_SITE_OTHER): Payer: Medicare Other | Admitting: Nurse Practitioner

## 2020-08-09 ENCOUNTER — Ambulatory Visit (INDEPENDENT_AMBULATORY_CARE_PROVIDER_SITE_OTHER)
Admission: RE | Admit: 2020-08-09 | Discharge: 2020-08-09 | Disposition: A | Discharge status: Home / Self Care | Payer: Medicare Other | Source: Ambulatory Visit | Attending: Nurse Practitioner | Admitting: Nurse Practitioner

## 2020-08-09 VITALS — BP 112/70 | HR 107 | Temp 98.0°F | Ht 65.0 in | Wt 169.8 lb

## 2020-08-09 DIAGNOSIS — K839 Disease of biliary tract, unspecified: Secondary | ICD-10-CM

## 2020-08-09 LAB — HEPATIC FUNCTION PANEL
ALT: 22 U/L (ref 0–35)
AST: 36 U/L (ref 0–37)
Albumin: 3.4 g/dL — ABNORMAL LOW (ref 3.5–5.2)
Alkaline Phosphatase: 170 U/L — ABNORMAL HIGH (ref 39–117)
Bilirubin, Direct: 0 mg/dL (ref 0.0–0.3)
Total Bilirubin: 0.3 mg/dL (ref 0.2–1.2)
Total Protein: 6.7 g/dL (ref 6.0–8.3)

## 2020-08-09 NOTE — Patient Instructions (Signed)
It was a pleasure to see you today. Based on our discussion, I am providing you with my recommendations below:  It was a pleasure to meet you today. Based on our discussion, I am providing you with my recommendations below:  RECOMMENDATION(S):   . Your provider has requested that you have an abdominal x ray before leaving today. Please go to the basement floor to our Radiology department for the test.  . Your provider has requested that you go to the basement level for lab work before leaving today. Press "B" on the elevator. The lab is located at the first door on the left as you exit the elevator.  BMI:  . If you are age 73 or older, your body mass index should be between 23-30. Your Body mass index is 28.26 kg/m. If this is out of the aforementioned range listed, please consider follow up with your Primary Care Provider.  . If you are age 52 or younger, your body mass index should be between 19-25. Your Body mass index is 28.26 kg/m. If this is out of the aformentioned range listed, please consider follow up with your Primary Care Provider.   Thank you for trusting me with your gastrointestinal care!    [name], NP

## 2020-08-09 NOTE — Progress Notes (Signed)
ASSESSMENT AND PLAN    # 55 year old female with persistent postop bile leak since March 2021.  She has required numerous ERCP with stent exchanges for clogged and/or migrated stents.  Most recent ERCP was on 06/26/2018, 4 biliary plastic stents were placed, see report below --Recommendation at time of last ERCP was for repeat ERCP in 4 months for stent exchange.  However, patient has been having increasing amounts of biliary drainage through her percutaneous catheter.  Upon discharge from the hospital mid February she was having about 40 mL output a day, now up to 400 a day.  --Obtain liver chemistries.  Will obtain KUB to look for placement of stents. --Patient is going to need follow-up ERCP for stent exchange.   # Post ERCP pancreatitis requiring prolonged admission last month.  Hospital course complicated by aspiration pneumonia.  Patient was slow to recover from pancreatitis, required enteral feedings.  Her symptoms have resolved   # Cirrhosis, compensated.  --Continue BID lactulose  HISTORY OF PRESENT ILLNESS     Primary Gastroenterologist :   Kim Edward, MD  Chief Complaint : hospital follow up   Kim Hicks is a 55 y.o. female with multiple medical problems not limited to anxiety, depression, hypertension, diabetes, cirrhosis, postop bile duct leak .   Kim Hicks had a laparoscopic cholecystectomy in March 6294 complicated by a bile duct leak .  Since then she has had several ERCPs with stent exchanges for a showing persistent cystic duct leak.  In December 2021 ERCP showed a clogged biliary stent and a persistent bile leak.  A fully covered metal stent was placed in the common bile duct.  Another ERCP early January showed the metal stent had migrated into the duodenum.  The stent was removed, there was no bile leak demonstrated, a plastic stent was placed.   She had a percutaneous drain study in late January which showed ongoing bile leak.  She underwent repeat ERCP 06/26/20 by Dr.  Rush Landmark.  Partially occluded migrated stent was removed.  The entire main bile duct was moderately dilated.  Sludge was found and removed from the biliary tree.  Biliary extravasation was noted from the cystic duct into the percutaneous drain.  4 biliary stents were placed into the CBD.   Patient presented to the ED with abdominal pain the day after her ERCP. she was found to have post ERCP pancreatitis.  She had a long hospital course which was complicated by likely aspiration pneumonia leading to sepsis requiring vasopressors.  She spent several days in ICU.  She was slow to recover from pancreatitis, required nasoenteric tube for enteral nutrition.  She did have decreased biliary output in her percutaneous drain.  Prior to discharge IR downsized percutaneous catheter.   Interval History:   Kim Hicks is here for hospital follow-up.  At the time of hospital discharge on 07/13/2020 she was having minimal output from her percutaneous drain .  Over the last few weeks however Kim Hicks says that the output is gone from about 40 mL a day to 400 mL a day.  Other than that she has no complaints.  She has not had any fever.  No significant abdominal pain. Appetite is okay  Most recent ERCP            Past Medical History:  Diagnosis Date  . Anxiety   . Cataract   . Cirrhosis of liver ([place])   . Common migraine with intractable migraine 08/07/2020  . Depression   .  Diabetes mellitus without complication ([place])   . Dysrhythmia   . Edema   . Family history of adverse reaction to anesthesia    mother had trouble waking up after surgery  . Foot fracture, left   . Headache    MIGRAINES  . Hyperlipidemia   . Hypertension   . Neuromuscular disorder ([place])    NEUROPATHY-both hands and feet  . Palpitations   . Wheezing     Current Medications, Allergies, Past Surgical History, Family History and Social History were reviewed in [employer] record.   Current Outpatient  Medications  Medication Sig Dispense Refill  . acetaminophen (TYLENOL) 500 MG tablet Take 1,000 mg by mouth every 6 (six) hours as needed for moderate pain.    . brexpiprazole (REXULTI) 2 MG TABS tablet Take 2 mg by mouth at bedtime.    . Dulaglutide 4.5 MG/0.5ML SOPN Inject 4.5 mg into the skin every Monday.    . Empagliflozin-metFORMIN HCl ER (SYNJARDY XR) 25-1000 MG TB24 Take 1 tablet by mouth daily before breakfast.     . ezetimibe (ZETIA) 10 MG tablet Take 10 mg by mouth daily.    . Insulin Glargine (BASAGLAR KWIKPEN) 100 UNIT/ML Inject 30 Units into the skin at bedtime.    [name] lactulose (CHRONULAC) 10 GM/15ML solution Take 15 mLs (10 g total) by mouth daily. 236 mL 0  . meloxicam (MOBIC) 15 MG tablet Take 15 mg by mouth daily.    . metFORMIN (GLUCOPHAGE) 1000 MG tablet Take 1,000 mg by mouth at bedtime.    . Nutritional Supplements (NUTRITIONAL SHAKE HIGH PROTEIN PO) Take 237 mLs by mouth 2 (two) times daily.    . ondansetron (ZOFRAN-ODT) 4 MG disintegrating tablet Take 4 mg by mouth every 8 (eight) hours as needed for nausea or vomiting.    . prazosin (MINIPRESS) 2 MG capsule Take 4 mg by mouth daily.    . pregabalin (LYRICA) 100 MG capsule One capsule in the morning and midday, 2 in the evening 360 capsule 1  . QUEtiapine (SEROQUEL) 400 MG tablet Take 0.5 tablets (200 mg total) by mouth at bedtime.    . simvastatin (ZOCOR) 10 MG tablet Take 10 mg by mouth daily.  6  . sodium chloride flush (NS) 0.9 % SOLN Inject 5 mLs into the vein every 12 (twelve) hours. 900 mL 0  . topiramate (TOPAMAX) 200 MG tablet Take 200 mg by mouth 2 (two) times daily.    [name] vortioxetine HBr ([name]) 20 MG TABS tablet Take 20 mg by mouth at bedtime.     No current facility-administered medications for this visit.    Review of Systems: No chest pain. No shortness of breath. No urinary complaints.   PHYSICAL EXAM :    Wt Readings from Last 3 Encounters:  08/09/20 169 lb 12.8 oz (77 kg)  08/07/20 165 lb  (74.8 kg)  08/05/20 167 lb (75.8 kg)    BP 112/70   Pulse (!) 107   Temp 98 F (36.7 C) (Tympanic)   Ht 5' 5"  (1.651 m)   Wt 169 lb 12.8 oz (77 kg)   SpO2 99%   BMI 28.26 kg/m  Constitutional:  Pleasant  female in no acute distress. Psychiatric: Normal mood and affect. Behavior is normal. EENT: Pupils normal.  Conjunctivae are normal. No scleral icterus. Neck supple.  Cardiovascular: Normal rate, regular rhythm. No edema Pulmonary/chest: Effort normal and breath sounds normal. No wheezing, rales or rhonchi. Abdominal: Soft, nondistended, nontender. Bowel sounds active  throughout. There are no masses palpable. No hepatomegaly.  RUQ percutaneous catheter.  She has about 200 mL of bilious fluid in drainage bag Neurological: Alert and oriented to person place and time. Skin: Skin is warm and dry. No rashes noted.  [name], NP  08/09/2020, 10:49 AM

## 2020-08-10 NOTE — Progress Notes (Signed)
Attending Physician's Attestation   I have reviewed the chart.   I agree with the Advanced Practitioner's note, impression, and recommendations with any updates as below. 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 Fuller Plan, this is a difficult situation.  The patient has previously failed plastic stenting, covered stenting, and for plastic stents being in the CBD.  Unfortunately most recent ERCP led to post ERCP pancreatitis.  It looks like she had been doing well but if she really is having this amount of drainage/output, there would be concern for stent dysfunction or whether this biliary leak will ever actually heal endoscopic therapy alone.  I am happy to be available, as able in the next few weeks, for a repeat attempt at likely fully covered self-expanding metal stent placement.  I think at this point, patient evaluation for the consideration of a bile duct reconstruction will need to be discussed with our surgical colleagues or at one of the [place] centers because she is now had 5 ERCPs and is still having it persistently.  I think 1 more attempt at fully covered self-expanding metal stent is reasonable but becoming less and less likely that endoscopic therapy is going to help this patient unfortunately.   Justice Britain, MD [facility] Advanced Endoscopy Office # [phone]

## 2020-08-12 ENCOUNTER — Telehealth: Payer: Self-pay

## 2020-08-12 NOTE — Telephone Encounter (Signed)
-----   Message from [name]., MD sent at 08/10/2020  1:31 AM EDT ----- Regarding: RE: MS and PG, Happy to be of assistance.  This is very unfortunate that she has failed four plastic stents being in her CBD to try and heal this.  I am not sure that we are going to be able to salvage this with everything that she has gone through that now over a year later still having a biliary leak present.  I think the next step will be a repeat attempt at fully covered self-expanding metal stent.  But I think because of her issues that surgery needs to reevaluate her to see about the consideration of a bile duct reconstruction and if that is not going to be performed here in [place] than consideration of it at one of the quaternary centers is reasonable because she has failed 4-5 ERCPs at this point. I will let she will decide upon that. Since she has a drain in place, will try to get her set up for an ERCP slot when I have availability (this is probably 6 to 10 weeks out but we will see what Chong Sicilian can find). [name], move forward with getting the patient scheduled for atypical ERCP.  Thanks. GM ----- Message ----- From: [name], MD Sent: 08/09/2020   3:12 PM EDT To: Timothy Lasso, RN, Willia Craze, NP, # Subject: RE:                                            Kim Hicks,   You performed her ERCP in Feb with 4 biliary stents placed for a persistent bile leak. Since she is a complex biliary patient would you please schedule her follow up ERCPs to manage her bile leak. I'm happy to follow her other GI problems and handle all her GI care after her bile leak has resolved.  Thanks,  MS ----- Message ----- From: Willia Craze, NP Sent: 08/09/2020  10:47 AM EDT To: [name], MD  Hi, this patient saw me for hospital follow up today. She has been a biliary nightmare. Looks like based on most recent ERCP in Feb 2022 that she needs repeat ERCP with stent exchange in June. Your hospital  schedule is not yet out for June. Thanks

## 2020-08-13 ENCOUNTER — Other Ambulatory Visit: Payer: Self-pay

## 2020-08-13 DIAGNOSIS — K839 Disease of biliary tract, unspecified: Secondary | ICD-10-CM

## 2020-08-13 NOTE — Telephone Encounter (Signed)
ERCP scheduled with Dr Rush Landmark at [facility] on 09/30/20

## 2020-08-13 NOTE — Telephone Encounter (Signed)
COVID test scheduled for 09/26/20 at 1010 am.  ERCP scheduled, pt instructed and medications reviewed.  Patient instructions mailed to home.  Patient to call with any questions or concerns.

## 2020-08-19 ENCOUNTER — Telehealth: Payer: Self-pay | Admitting: Nurse Practitioner

## 2020-08-19 DIAGNOSIS — K839 Disease of biliary tract, unspecified: Secondary | ICD-10-CM

## 2020-08-19 NOTE — Telephone Encounter (Signed)
Inbound call from patient requesting a call back from a nurse please.  States there is a foul smell coming from the bile duct which she's never had before.  Please advise.

## 2020-08-19 NOTE — Telephone Encounter (Signed)
Dr Rush Landmark the pt is calling to discuss a very foul smell coming from her "bag used for the bile leak" she says the tubing is very dark and "nasty".  She mentions the fluid coming into the bag is not only very foul it has mucous in it and more volume.  This all began on Friday.  She has pain on the right side neat the tubing.  Please advise

## 2020-08-20 ENCOUNTER — Telehealth: Payer: Self-pay

## 2020-08-20 NOTE — Telephone Encounter (Signed)
I spoke with the pt and she will call IR to make appt. She will also come in to our office for labs. Orders have been entered.

## 2020-08-20 NOTE — Telephone Encounter (Signed)
Patient made aware that this is an GI issue and will need to contact Dr. Gaylan Gerold office for further evaluation with IR. Patient verbalized understanding.  Kim Hicks

## 2020-08-20 NOTE — Telephone Encounter (Signed)
If there is a significant change in the way the tubing S, the query/consideration would be if she is developing an infection or if there needs to be tube exchange.  Recommend patient coming in for labs to check CBC and CMP.  Also recommend giving patient number for interventional radiology clinic as percutaneous drain may need to be exchanged if she is having issues from the tubing.  Please keep Norberto Sorenson and myself up-to-date.  Thanks. GM

## 2020-08-20 NOTE — Telephone Encounter (Signed)
Patient called states her bile duct drain has slightly come out and the stitch has broken loose. Patient states she has always had some leakage with this drain, but now she states there is a foul odor. Patient denies any fevers, chills, vomiting. Patient states she has connected with GI and there referred her back to [facility] regarding the issue. Advised patient that she may need to go to ED have to have drain evaluated, but patient delined. And states she could not do that today. Also offered patient a video visit with open slots today, but patient leaves about 30 minutes away and prefers a in person visit. Routing to Dr. Gale Journey for additional advise.  Kim Hicks

## 2020-08-20 NOTE — Telephone Encounter (Signed)
-----   Message from [name]., MD sent at 08/20/2020  4:16 PM EDT ----- Dr. Gale Journey, Thanks for reaching out.   We aren't sure if dislodge, but it could be with her drainage having changed.  She will be reaching out to IR in case they need to make any adjustments. Dr. Fuller Plan and I will continue to work on seeing if endoscopic approach to bile leak will be possible or if she needs surgical intervention. Thanks. GM ----- Message ----- From: Jabier Mutton, MD Sent: 08/20/2020   4:11 PM EDT To: [name]., MD, #  Dear Dr Rush Landmark  I reviewed the message from my clinic that this patient's perc drain is dislodged  I last seen her several weeks ago and previously treated for infected biloma. She has no further id issue and will need further biloma/ongoing leak managed with GI/IR  Please let me know if there is concern for recurrent infection of her biloma   Thank you       Jabier Mutton, [place] [facility] [place] [phone]  pager   [phone] cell 08/20/2020, 4:11 PM

## 2020-08-21 ENCOUNTER — Ambulatory Visit (INDEPENDENT_AMBULATORY_CARE_PROVIDER_SITE_OTHER): Payer: Medicare Other | Admitting: Gastroenterology

## 2020-08-21 ENCOUNTER — Other Ambulatory Visit: Payer: Self-pay

## 2020-08-21 ENCOUNTER — Other Ambulatory Visit (INDEPENDENT_AMBULATORY_CARE_PROVIDER_SITE_OTHER): Payer: Medicare Other

## 2020-08-21 DIAGNOSIS — K839 Disease of biliary tract, unspecified: Secondary | ICD-10-CM | POA: Diagnosis not present

## 2020-08-21 DIAGNOSIS — Z23 Encounter for immunization: Secondary | ICD-10-CM | POA: Diagnosis not present

## 2020-08-21 DIAGNOSIS — R7989 Other specified abnormal findings of blood chemistry: Secondary | ICD-10-CM

## 2020-08-21 LAB — COMPREHENSIVE METABOLIC PANEL
ALT: 17 U/L (ref 0–35)
AST: 27 U/L (ref 0–37)
Albumin: 3.7 g/dL (ref 3.5–5.2)
Alkaline Phosphatase: 160 U/L — ABNORMAL HIGH (ref 39–117)
BUN: 23 mg/dL (ref 6–23)
CO2: 24 mEq/L (ref 19–32)
Calcium: 8.9 mg/dL (ref 8.4–10.5)
Chloride: 108 mEq/L (ref 96–112)
Creatinine, Ser: 0.53 mg/dL (ref 0.40–1.20)
GFR: 103.89 mL/min (ref >60.00)
Glucose, Bld: 231 mg/dL — ABNORMAL HIGH (ref 70–99)
Potassium: 4.8 mEq/L (ref 3.5–5.1)
Sodium: 140 mEq/L (ref 135–145)
Total Bilirubin: 0.3 mg/dL (ref 0.2–1.2)
Total Protein: 6.8 g/dL (ref 6.0–8.3)

## 2020-08-21 LAB — CBC WITH DIFFERENTIAL/PLATELET
Basophils Absolute: 0 10*3/uL (ref 0.0–0.1)
Basophils Relative: 0.5 % (ref 0.0–3.0)
Eosinophils Absolute: 0.3 10*3/uL (ref 0.0–0.7)
Eosinophils Relative: 7 % — ABNORMAL HIGH (ref 0.0–5.0)
HCT: 29.2 % — ABNORMAL LOW (ref 36.0–46.0)
Hemoglobin: 9.1 g/dL — ABNORMAL LOW (ref 12.0–15.0)
Lymphocytes Relative: 22.6 % (ref 12.0–46.0)
Lymphs Abs: 1.1 10*3/uL (ref 0.7–4.0)
MCHC: 31.3 g/dL (ref 30.0–36.0)
MCV: 76.2 fl — ABNORMAL LOW (ref 78.0–100.0)
Monocytes Absolute: 0.4 10*3/uL (ref 0.1–1.0)
Monocytes Relative: 7.6 % (ref 3.0–12.0)
Neutro Abs: 3.1 10*3/uL (ref 1.4–7.7)
Neutrophils Relative %: 62.3 % (ref 43.0–77.0)
Platelets: 158 10*3/uL (ref 150.0–400.0)
RBC: 3.83 Mil/uL — ABNORMAL LOW (ref 3.87–5.11)
RDW: 17.8 % — ABNORMAL HIGH (ref 11.5–15.5)
WBC: 5 10*3/uL (ref 4.0–10.5)

## 2020-08-26 NOTE — Telephone Encounter (Signed)
Inbound call from patient requesting another call from the nurse please; has additional questions.

## 2020-08-27 ENCOUNTER — Other Ambulatory Visit (HOSPITAL_COMMUNITY): Payer: Self-pay | Admitting: Radiology

## 2020-08-27 DIAGNOSIS — Z9049 Acquired absence of other specified parts of digestive tract: Secondary | ICD-10-CM

## 2020-08-27 NOTE — Telephone Encounter (Signed)
Left message for pt to call back  °

## 2020-08-27 NOTE — Telephone Encounter (Signed)
Pt called stating that radiology told her they could not help her. States she pulled the stiches out and her husband has the drain in place with tape. Also reports a foul odor. Called IR and spoke with Tiffany who reports she has not spoken with the pt. Tiffany will have PA call pt and discuss problems with drain.

## 2020-08-28 ENCOUNTER — Other Ambulatory Visit (INDEPENDENT_AMBULATORY_CARE_PROVIDER_SITE_OTHER): Payer: Medicare Other

## 2020-08-28 ENCOUNTER — Other Ambulatory Visit: Payer: Self-pay

## 2020-08-28 ENCOUNTER — Other Ambulatory Visit (HOSPITAL_COMMUNITY): Payer: Self-pay | Admitting: Radiology

## 2020-08-28 ENCOUNTER — Ambulatory Visit (HOSPITAL_COMMUNITY)
Admission: RE | Admit: 2020-08-28 | Discharge: 2020-08-28 | Disposition: A | Discharge status: Home / Self Care | Payer: Medicare Other | Source: Ambulatory Visit | Attending: Radiology | Admitting: Radiology

## 2020-08-28 ENCOUNTER — Other Ambulatory Visit (HOSPITAL_COMMUNITY): Payer: Self-pay | Admitting: Diagnostic Radiology

## 2020-08-28 ENCOUNTER — Other Ambulatory Visit: Payer: Self-pay | Admitting: Gastroenterology

## 2020-08-28 DIAGNOSIS — Z9049 Acquired absence of other specified parts of digestive tract: Secondary | ICD-10-CM

## 2020-08-28 DIAGNOSIS — K839 Disease of biliary tract, unspecified: Secondary | ICD-10-CM | POA: Diagnosis not present

## 2020-08-28 DIAGNOSIS — Z4803 Encounter for change or removal of drains: Secondary | ICD-10-CM | POA: Diagnosis not present

## 2020-08-28 DIAGNOSIS — R7989 Other specified abnormal findings of blood chemistry: Secondary | ICD-10-CM | POA: Diagnosis not present

## 2020-08-28 DIAGNOSIS — D509 Iron deficiency anemia, unspecified: Secondary | ICD-10-CM

## 2020-08-28 HISTORY — PX: IR EXCHANGE BILIARY DRAIN: IMG6046

## 2020-08-28 LAB — IBC + FERRITIN
Ferritin: 6.4 ng/mL — ABNORMAL LOW (ref 10.0–291.0)
Iron: 19 ug/dL — ABNORMAL LOW (ref 42–145)
Saturation Ratios: 4.3 % — ABNORMAL LOW (ref 20.0–50.0)
Transferrin: 319 mg/dL (ref 212.0–360.0)

## 2020-08-28 MED ORDER — IOHEXOL 300 MG/ML  SOLN
50.0000 mL | Freq: Once | INTRAMUSCULAR | Status: AC | PRN
  Administered 2020-08-28: 10 mL

## 2020-08-28 MED ORDER — IRON 325 (65 FE) MG PO TABS: 325.0000 mg | ORAL_TABLET | Freq: Two times a day (BID) | ORAL | 0 refills | Status: DC

## 2020-08-28 MED ORDER — LIDOCAINE HCL 1 % IJ SOLN
INTRAMUSCULAR | Status: AC
  Administered 2020-08-28: 2 mL
  Filled 2020-08-28: qty 20

## 2020-08-28 NOTE — Procedures (Signed)
Interventional Radiology Procedure:   Indications: Chronic bile leak, concern for dislodged drain in gallbladder fossa.  Procedure: Drain injection and drain change.  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  New 8.5 Fr drain placed in gallbladder fossa.   Complications: None     EBL: None  Plan: 12 week drain exchange.   Omauri Boeve R. Anselm Pancoast, MD  Pager: [phone]

## 2020-08-29 ENCOUNTER — Telehealth: Payer: Self-pay | Admitting: Nurse Practitioner

## 2020-08-29 NOTE — Telephone Encounter (Signed)
Please confirm the Linzess dose she is taking and OK to refill for 6 months.

## 2020-08-29 NOTE — Telephone Encounter (Signed)
Please advise. I do not see this on her active medication list.

## 2020-08-29 NOTE — Telephone Encounter (Signed)
Pt needs rf for Lin[facility] sent to [facility].

## 2020-08-30 MED ORDER — LINACLOTIDE 290 MCG PO CAPS: 290.0000 ug | ORAL_CAPSULE | Freq: Every day | ORAL | 1 refills | Status: DC

## 2020-08-30 NOTE — Telephone Encounter (Signed)
Patient returned your call and stated she takes 290 mcg of the Linzess.

## 2020-08-30 NOTE — Telephone Encounter (Signed)
Called patient and left message that we need to know what dose of Linzess she is taking and we can send a refill for [facility] to [facility].

## 2020-08-30 NOTE — Telephone Encounter (Signed)
Linzess 290 mcg - Once daily before breakfast, #90 1 refill sent to pharmacy.

## 2020-09-09 ENCOUNTER — Telehealth: Payer: Self-pay | Admitting: Gastroenterology

## 2020-09-09 MED ORDER — LACTULOSE 10 GM/15ML PO SOLN: 10.0000 g | Freq: Three times a day (TID) | ORAL | 0 refills | Status: DC | PRN

## 2020-09-09 NOTE — Telephone Encounter (Signed)
Pt is requesting a call back from a nurse to discuss her abdominal pain and difficulty to use the bathroom.

## 2020-09-09 NOTE — Telephone Encounter (Signed)
Patient was instructed by [name] RNP to take lactulose 15 ml TID as needed for constipation.  Patient is out.  New rx sent.  She will call back if this fails to resolve her constipation.

## 2020-09-16 ENCOUNTER — Telehealth: Payer: Self-pay | Admitting: Nurse Practitioner

## 2020-09-16 NOTE — Telephone Encounter (Signed)
Clear liquids for 1-2 days. Try 2 Fleets enemas and if not effective then a Miralax/Gatorade bowel prep and continue on her currently laxative regimen.

## 2020-09-16 NOTE — Telephone Encounter (Addendum)
Patient last seen by Nevin Bloodgood for a hospital follow up, cirrhosis and a bile leak. She calls with complaints of no bowel movement in 2 weeks. Patient was passing "jelly stuff" last week. She is now passing only "stinky gas." She is taking Lactulose 3 times daily Miralax 2 times daily, Linzess 290 mcg and she has purchased OTC stool softeners Pericolace. She feels pain in her abdomen at her "boyfriend" bag, under her ribs  "where those stents are" and into her back. She has not used any enemas.

## 2020-09-16 NOTE — Telephone Encounter (Signed)
Pt states that she has not had a bm in two weeks. Would like some advise.

## 2020-09-16 NOTE — Telephone Encounter (Signed)
Patient instructed on the plan. She agrees to this plan and will follow up by phone with her progress. She will call sooner if she acutely worsens.

## 2020-09-18 NOTE — Telephone Encounter (Signed)
Patient was seen in an ER on 09/17/20 for the abdominal pain and constipation. See Care Everywhere.  She has a procedure with Dr Rush Landmark on 09/30/20.  "[place], [place] - 09/17/2020  Formatting of this note might be different from the original. You were seen in the Emergency Department for abdominal pain. Your labwork and physical examination were very reassuring as was your CT scan. Your imaging showed increased stool and gas likely as the cause for your pain. You were prescribed golytely to take one more time tomorrow if you are not having signficant bowel movement. We would also recommend taking senna 2 tablets in the morning. Continue your lactulose twice daily as prescribed. Please follow-up with your primary care provider in the next week to make sure that symptoms are resolving. If you pain worsens, you develop nausea or vomiting, diarrhea, blood in stool or vomit, fevers or any other concerning symptoms, please seek urgent medical attention. "  Called the patient to follow up. No answer. Left her a message to call us if she was not improving or had concerns, questions.

## 2020-09-20 NOTE — Telephone Encounter (Signed)
Ok, thanks [employer]

## 2020-09-25 ENCOUNTER — Encounter (HOSPITAL_COMMUNITY): Payer: Self-pay | Admitting: Gastroenterology

## 2020-09-25 ENCOUNTER — Other Ambulatory Visit: Payer: Self-pay

## 2020-09-25 ENCOUNTER — Telehealth: Payer: Self-pay | Admitting: Neurology

## 2020-09-25 NOTE — Telephone Encounter (Signed)
Pt has been told by pharmacy they need a new prescription for pt's current dose of pregabalin (LYRICA) [facility] MG capsule [id number]

## 2020-09-26 ENCOUNTER — Other Ambulatory Visit (HOSPITAL_COMMUNITY)
Admission: RE | Admit: 2020-09-26 | Discharge: 2020-09-26 | Disposition: A | Discharge status: Home / Self Care | Payer: Medicare Other | Source: Ambulatory Visit | Attending: Gastroenterology | Admitting: Gastroenterology

## 2020-09-26 DIAGNOSIS — Z01812 Encounter for preprocedural laboratory examination: Secondary | ICD-10-CM | POA: Insufficient documentation

## 2020-09-26 DIAGNOSIS — Z20822 Contact with and (suspected) exposure to covid-19: Secondary | ICD-10-CM | POA: Diagnosis not present

## 2020-09-26 NOTE — Telephone Encounter (Signed)
Returned patient's call.  She does not need another prescription, she found another bottle of Lyrica she had misplaced.  Patient denied further questions, verbalized understanding and expressed appreciation for the phone call.

## 2020-09-27 LAB — SARS CORONAVIRUS 2 (TAT 6-24 HRS): SARS Coronavirus 2: NEGATIVE

## 2020-09-30 ENCOUNTER — Other Ambulatory Visit: Payer: Self-pay

## 2020-09-30 ENCOUNTER — Ambulatory Visit (HOSPITAL_COMMUNITY): Payer: Medicare Other | Admitting: Anesthesiology

## 2020-09-30 ENCOUNTER — Encounter (HOSPITAL_COMMUNITY): Payer: Self-pay | Admitting: Gastroenterology

## 2020-09-30 ENCOUNTER — Ambulatory Visit (HOSPITAL_COMMUNITY): Payer: Medicare Other

## 2020-09-30 ENCOUNTER — Inpatient Hospital Stay (HOSPITAL_COMMUNITY)
Admission: RE | Admit: 2020-09-30 | Discharge: 2020-10-03 | DRG: 919 | Disposition: A | Discharge status: Home / Self Care | Payer: Medicare Other | Attending: Internal Medicine | Admitting: Internal Medicine

## 2020-09-30 ENCOUNTER — Encounter (HOSPITAL_COMMUNITY)
Admission: RE | Disposition: A | Discharge status: Home / Self Care | Payer: Self-pay | Source: Home / Self Care | Attending: Internal Medicine

## 2020-09-30 ENCOUNTER — Observation Stay (HOSPITAL_COMMUNITY): Payer: Medicare Other

## 2020-09-30 DIAGNOSIS — K8050 Calculus of bile duct without cholangitis or cholecystitis without obstruction: Secondary | ICD-10-CM

## 2020-09-30 DIAGNOSIS — R1012 Left upper quadrant pain: Secondary | ICD-10-CM

## 2020-09-30 DIAGNOSIS — Z4659 Encounter for fitting and adjustment of other gastrointestinal appliance and device: Secondary | ICD-10-CM | POA: Diagnosis not present

## 2020-09-30 DIAGNOSIS — I10 Essential (primary) hypertension: Secondary | ICD-10-CM | POA: Diagnosis present

## 2020-09-30 DIAGNOSIS — F419 Anxiety disorder, unspecified: Secondary | ICD-10-CM | POA: Diagnosis present

## 2020-09-30 DIAGNOSIS — Z818 Family history of other mental and behavioral disorders: Secondary | ICD-10-CM

## 2020-09-30 DIAGNOSIS — K7469 Other cirrhosis of liver: Secondary | ICD-10-CM | POA: Diagnosis present

## 2020-09-30 DIAGNOSIS — Y848 Other medical procedures as the cause of abnormal reaction of the patient, or of later complication, without mention of misadventure at the time of the procedure: Secondary | ICD-10-CM | POA: Diagnosis present

## 2020-09-30 DIAGNOSIS — K838 Other specified diseases of biliary tract: Secondary | ICD-10-CM

## 2020-09-30 DIAGNOSIS — K219 Gastro-esophageal reflux disease without esophagitis: Secondary | ICD-10-CM | POA: Diagnosis present

## 2020-09-30 DIAGNOSIS — D61818 Other pancytopenia: Secondary | ICD-10-CM | POA: Diagnosis present

## 2020-09-30 DIAGNOSIS — K3189 Other diseases of stomach and duodenum: Secondary | ICD-10-CM | POA: Diagnosis not present

## 2020-09-30 DIAGNOSIS — E119 Type 2 diabetes mellitus without complications: Secondary | ICD-10-CM | POA: Diagnosis present

## 2020-09-30 DIAGNOSIS — E785 Hyperlipidemia, unspecified: Secondary | ICD-10-CM | POA: Diagnosis present

## 2020-09-30 DIAGNOSIS — D509 Iron deficiency anemia, unspecified: Secondary | ICD-10-CM | POA: Diagnosis present

## 2020-09-30 DIAGNOSIS — R109 Unspecified abdominal pain: Secondary | ICD-10-CM | POA: Diagnosis present

## 2020-09-30 DIAGNOSIS — Z833 Family history of diabetes mellitus: Secondary | ICD-10-CM

## 2020-09-30 DIAGNOSIS — T402X5A Adverse effect of other opioids, initial encounter: Secondary | ICD-10-CM | POA: Diagnosis present

## 2020-09-30 DIAGNOSIS — Z888 Allergy status to other drugs, medicaments and biological substances status: Secondary | ICD-10-CM

## 2020-09-30 DIAGNOSIS — K839 Disease of biliary tract, unspecified: Secondary | ICD-10-CM | POA: Diagnosis present

## 2020-09-30 DIAGNOSIS — Z8249 Family history of ischemic heart disease and other diseases of the circulatory system: Secondary | ICD-10-CM

## 2020-09-30 DIAGNOSIS — Z9981 Dependence on supplemental oxygen: Secondary | ICD-10-CM

## 2020-09-30 DIAGNOSIS — Z794 Long term (current) use of insulin: Secondary | ICD-10-CM

## 2020-09-30 DIAGNOSIS — K859 Acute pancreatitis without necrosis or infection, unspecified: Secondary | ICD-10-CM | POA: Diagnosis present

## 2020-09-30 DIAGNOSIS — Z20822 Contact with and (suspected) exposure to covid-19: Secondary | ICD-10-CM | POA: Diagnosis present

## 2020-09-30 DIAGNOSIS — T8189XA Other complications of procedures, not elsewhere classified, initial encounter: Principal | ICD-10-CM | POA: Diagnosis present

## 2020-09-30 DIAGNOSIS — Z7984 Long term (current) use of oral hypoglycemic drugs: Secondary | ICD-10-CM

## 2020-09-30 DIAGNOSIS — G8929 Other chronic pain: Secondary | ICD-10-CM | POA: Diagnosis present

## 2020-09-30 DIAGNOSIS — G629 Polyneuropathy, unspecified: Secondary | ICD-10-CM | POA: Diagnosis present

## 2020-09-30 DIAGNOSIS — W19XXXA Unspecified fall, initial encounter: Secondary | ICD-10-CM

## 2020-09-30 DIAGNOSIS — F32A Depression, unspecified: Secondary | ICD-10-CM | POA: Diagnosis present

## 2020-09-30 DIAGNOSIS — L299 Pruritus, unspecified: Secondary | ICD-10-CM | POA: Diagnosis present

## 2020-09-30 DIAGNOSIS — Z79899 Other long term (current) drug therapy: Secondary | ICD-10-CM

## 2020-09-30 DIAGNOSIS — J9610 Chronic respiratory failure, unspecified whether with hypoxia or hypercapnia: Secondary | ICD-10-CM | POA: Diagnosis present

## 2020-09-30 HISTORY — DX: Other specified health status: Z78.9

## 2020-09-30 HISTORY — PX: BILIARY STENT PLACEMENT: SHX5538

## 2020-09-30 HISTORY — PX: ENDOSCOPIC RETROGRADE CHOLANGIOPANCREATOGRAPHY (ERCP) WITH PROPOFOL: SHX5810

## 2020-09-30 HISTORY — PX: REMOVAL OF STONES: SHX5545

## 2020-09-30 HISTORY — PX: STENT REMOVAL: SHX6421

## 2020-09-30 LAB — BASIC METABOLIC PANEL
Anion gap: 9 (ref 5–15)
BUN: 35 mg/dL — ABNORMAL HIGH (ref 6–20)
CO2: 23 mmol/L (ref 22–32)
Calcium: 8.8 mg/dL — ABNORMAL LOW (ref 8.9–10.3)
Chloride: 102 mmol/L (ref 98–111)
Creatinine, Ser: 0.79 mg/dL (ref 0.44–1.00)
GFR, Estimated: >60 mL/min (ref >60)
Glucose, Bld: 286 mg/dL — ABNORMAL HIGH (ref 70–99)
Potassium: 6 mmol/L — ABNORMAL HIGH (ref 3.5–5.1)
Sodium: 134 mmol/L — ABNORMAL LOW (ref 135–145)

## 2020-09-30 LAB — CBC WITH DIFFERENTIAL/PLATELET
Abs Immature Granulocytes: 0.03 10*3/uL (ref 0.00–0.07)
Basophils Absolute: 0 10*3/uL (ref 0.0–0.1)
Basophils Relative: 0 %
Eosinophils Absolute: 0 10*3/uL (ref 0.0–0.5)
Eosinophils Relative: 1 %
HCT: 37.2 % (ref 36.0–46.0)
Hemoglobin: 10.8 g/dL — ABNORMAL LOW (ref 12.0–15.0)
Immature Granulocytes: 1 %
Lymphocytes Relative: 8 %
Lymphs Abs: 0.5 10*3/uL — ABNORMAL LOW (ref 0.7–4.0)
MCH: 25.1 pg — ABNORMAL LOW (ref 26.0–34.0)
MCHC: 29 g/dL — ABNORMAL LOW (ref 30.0–36.0)
MCV: 86.3 fL (ref 80.0–100.0)
Monocytes Absolute: 0.1 10*3/uL (ref 0.1–1.0)
Monocytes Relative: 1 %
Neutro Abs: 6 10*3/uL (ref 1.7–7.7)
Neutrophils Relative %: 89 %
Platelets: 171 10*3/uL (ref 150–400)
RBC: 4.31 MIL/uL (ref 3.87–5.11)
RDW: 19 % — ABNORMAL HIGH (ref 11.5–15.5)
WBC: 6.6 10*3/uL (ref 4.0–10.5)
nRBC: 0 % (ref 0.0–0.2)

## 2020-09-30 LAB — GLUCOSE, CAPILLARY
Glucose-Capillary: 178 mg/dL — ABNORMAL HIGH (ref 70–99)
Glucose-Capillary: 261 mg/dL — ABNORMAL HIGH (ref 70–99)
Glucose-Capillary: 310 mg/dL — ABNORMAL HIGH (ref 70–99)
Glucose-Capillary: 123 mg/dL — ABNORMAL HIGH (ref 70–99)

## 2020-09-30 LAB — HEPATIC FUNCTION PANEL
ALT: 42 U/L (ref 0–44)
AST: 60 U/L — ABNORMAL HIGH (ref 15–41)
Albumin: 3.8 g/dL (ref 3.5–5.0)
Alkaline Phosphatase: 180 U/L — ABNORMAL HIGH (ref 38–126)
Bilirubin, Direct: 0.1 mg/dL (ref 0.0–0.2)
Indirect Bilirubin: 0.6 mg/dL (ref 0.3–0.9)
Total Bilirubin: 0.7 mg/dL (ref 0.3–1.2)
Total Protein: 7.7 g/dL (ref 6.5–8.1)

## 2020-09-30 LAB — HEMOGLOBIN A1C
Hgb A1c MFr Bld: 9.5 % — ABNORMAL HIGH (ref 4.8–5.6)
Mean Plasma Glucose: 225.95 mg/dL

## 2020-09-30 LAB — LIPASE, BLOOD: Lipase: 46 U/L (ref 11–51)

## 2020-09-30 LAB — C-REACTIVE PROTEIN: CRP: 1.7 mg/dL — ABNORMAL HIGH (ref <1.0)

## 2020-09-30 SURGERY — ENDOSCOPIC RETROGRADE CHOLANGIOPANCREATOGRAPHY (ERCP) WITH PROPOFOL
Anesthesia: General

## 2020-09-30 MED ORDER — GLUCAGON HCL RDNA (DIAGNOSTIC) 1 MG IJ SOLR
INTRAMUSCULAR | Status: AC
  Filled 2020-09-30: qty 1

## 2020-09-30 MED ORDER — INSULIN ASPART 100 UNIT/ML IJ SOLN
0.0000 [IU] | INTRAMUSCULAR | Status: DC
  Administered 2020-09-30: 11 [IU] via SUBCUTANEOUS
  Administered 2020-09-30: 8 [IU] via SUBCUTANEOUS
  Administered 2020-09-30 – 2020-10-01 (×2): 2 [IU] via SUBCUTANEOUS
  Administered 2020-10-02 (×2): 3 [IU] via SUBCUTANEOUS
  Administered 2020-10-03: 5 [IU] via SUBCUTANEOUS
  Administered 2020-10-03: 3 [IU] via SUBCUTANEOUS
  Administered 2020-10-03: 2 [IU] via SUBCUTANEOUS
  Administered 2020-10-03: 3 [IU] via SUBCUTANEOUS

## 2020-09-30 MED ORDER — LIDOCAINE 2% (20 MG/ML) 5 ML SYRINGE
INTRAMUSCULAR | Status: DC | PRN
  Administered 2020-09-30: 60 mg via INTRAVENOUS

## 2020-09-30 MED ORDER — SODIUM CHLORIDE 0.9 % IV SOLN
6.2500 mg | Freq: Once | INTRAVENOUS | Status: DC
  Filled 2020-09-30: qty 0.25

## 2020-09-30 MED ORDER — SODIUM CHLORIDE 0.9 % IV SOLN
6.2500 mg | Freq: Once | INTRAVENOUS | Status: AC
  Administered 2020-09-30: 6.25 mg via INTRAVENOUS
  Filled 2020-09-30: qty 0.25

## 2020-09-30 MED ORDER — PROPOFOL 10 MG/ML IV BOLUS
INTRAVENOUS | Status: DC | PRN
  Administered 2020-09-30: 130 mg via INTRAVENOUS

## 2020-09-30 MED ORDER — FENTANYL CITRATE (PF) 100 MCG/2ML IJ SOLN
50.0000 ug | Freq: Once | INTRAMUSCULAR | Status: AC
Start: 2020-09-30 — End: 2020-09-30
  Administered 2020-09-30: 50 ug via INTRAVENOUS

## 2020-09-30 MED ORDER — FENTANYL CITRATE (PF) 100 MCG/2ML IJ SOLN
INTRAMUSCULAR | Status: AC
  Filled 2020-09-30: qty 2

## 2020-09-30 MED ORDER — ONDANSETRON HCL 4 MG PO TABS
4.0000 mg | ORAL_TABLET | Freq: Four times a day (QID) | ORAL | Status: DC | PRN
  Administered 2020-10-01: 4 mg via ORAL
  Filled 2020-09-30 (×2): qty 1

## 2020-09-30 MED ORDER — GLUCAGON HCL RDNA (DIAGNOSTIC) 1 MG IJ SOLR
INTRAMUSCULAR | Status: DC | PRN
  Administered 2020-09-30 (×3): .25 mg via INTRAVENOUS

## 2020-09-30 MED ORDER — MIDAZOLAM HCL 2 MG/2ML IJ SOLN
INTRAMUSCULAR | Status: AC
  Filled 2020-09-30: qty 2

## 2020-09-30 MED ORDER — IOPAMIDOL (ISOVUE-300) INJECTION 61%
INTRAVENOUS | Status: DC | PRN
  Administered 2020-09-30: 75 mL

## 2020-09-30 MED ORDER — OXYCODONE HCL 5 MG PO TABS: 5.0000 mg | ORAL_TABLET | Freq: Four times a day (QID) | ORAL | 0 refills | Status: DC | PRN

## 2020-09-30 MED ORDER — INDOMETHACIN 50 MG RE SUPP
RECTAL | Status: DC | PRN
  Administered 2020-09-30: 100 mg via RECTAL

## 2020-09-30 MED ORDER — PANTOPRAZOLE SODIUM 40 MG IV SOLR
40.0000 mg | INTRAVENOUS | Status: DC
  Administered 2020-09-30 – 2020-10-01 (×2): 40 mg via INTRAVENOUS
  Filled 2020-09-30 (×2): qty 40

## 2020-09-30 MED ORDER — CIPROFLOXACIN IN D5W 400 MG/200ML IV SOLN
INTRAVENOUS | Status: AC
  Filled 2020-09-30: qty 200

## 2020-09-30 MED ORDER — ONDANSETRON HCL 4 MG/2ML IJ SOLN
INTRAMUSCULAR | Status: DC | PRN
  Administered 2020-09-30: 4 mg via INTRAVENOUS

## 2020-09-30 MED ORDER — DEXAMETHASONE SODIUM PHOSPHATE 10 MG/ML IJ SOLN
INTRAMUSCULAR | Status: DC | PRN
  Administered 2020-09-30: 10 mg via INTRAVENOUS

## 2020-09-30 MED ORDER — ONDANSETRON HCL 4 MG/2ML IJ SOLN
4.0000 mg | Freq: Four times a day (QID) | INTRAMUSCULAR | Status: DC | PRN
  Administered 2020-10-01 (×2): 4 mg via INTRAVENOUS
  Filled 2020-09-30 (×2): qty 2

## 2020-09-30 MED ORDER — PROPOFOL 10 MG/ML IV BOLUS
INTRAVENOUS | Status: AC
  Filled 2020-09-30: qty 20

## 2020-09-30 MED ORDER — HYDRALAZINE HCL 20 MG/ML IJ SOLN: 10.0000 mg | Freq: Four times a day (QID) | INTRAMUSCULAR | Status: DC | PRN

## 2020-09-30 MED ORDER — HYDROMORPHONE HCL 1 MG/ML IJ SOLN
INTRAMUSCULAR | Status: AC
  Filled 2020-09-30: qty 1

## 2020-09-30 MED ORDER — LIP MEDEX EX OINT
TOPICAL_OINTMENT | CUTANEOUS | Status: AC
  Filled 2020-09-30: qty 7

## 2020-09-30 MED ORDER — LACTATED RINGERS IV SOLN
INTRAVENOUS | Status: DC
  Administered 2020-09-30: 1000 mL via INTRAVENOUS

## 2020-09-30 MED ORDER — ROCURONIUM BROMIDE 10 MG/ML (PF) SYRINGE
PREFILLED_SYRINGE | INTRAVENOUS | Status: DC | PRN
  Administered 2020-09-30: 50 mg via INTRAVENOUS
  Administered 2020-09-30: 10 mg via INTRAVENOUS

## 2020-09-30 MED ORDER — MIDAZOLAM HCL 2 MG/2ML IJ SOLN
INTRAMUSCULAR | Status: DC | PRN
  Administered 2020-09-30: 2 mg via INTRAVENOUS

## 2020-09-30 MED ORDER — KETOROLAC TROMETHAMINE 30 MG/ML IJ SOLN
30.0000 mg | Freq: Once | INTRAMUSCULAR | Status: AC
  Administered 2020-09-30: 30 mg via INTRAVENOUS
  Filled 2020-09-30: qty 1

## 2020-09-30 MED ORDER — CIPROFLOXACIN IN D5W 400 MG/200ML IV SOLN
INTRAVENOUS | Status: DC | PRN
  Administered 2020-09-30: 400 mg via INTRAVENOUS

## 2020-09-30 MED ORDER — SODIUM CHLORIDE 0.45 % IV SOLN: INTRAVENOUS | Status: DC

## 2020-09-30 MED ORDER — PHENYLEPHRINE 40 MCG/ML (10ML) SYRINGE FOR IV PUSH (FOR BLOOD PRESSURE SUPPORT)
PREFILLED_SYRINGE | INTRAVENOUS | Status: DC | PRN
  Administered 2020-09-30: 120 ug via INTRAVENOUS
  Administered 2020-09-30 (×2): 80 ug via INTRAVENOUS
  Administered 2020-09-30: 40 ug via INTRAVENOUS
  Administered 2020-09-30: 80 ug via INTRAVENOUS

## 2020-09-30 MED ORDER — SUGAMMADEX SODIUM 200 MG/2ML IV SOLN
INTRAVENOUS | Status: DC | PRN
  Administered 2020-09-30: 200 mg via INTRAVENOUS

## 2020-09-30 MED ORDER — INDOMETHACIN 50 MG RE SUPP
RECTAL | Status: AC
  Filled 2020-09-30: qty 2

## 2020-09-30 MED ORDER — LACTATED RINGERS IV SOLN: INTRAVENOUS | Status: AC

## 2020-09-30 MED ORDER — HYDROMORPHONE HCL 1 MG/ML IJ SOLN
1.0000 mg | INTRAMUSCULAR | Status: DC | PRN
  Administered 2020-09-30 – 2020-10-01 (×5): 1 mg via INTRAVENOUS
  Filled 2020-09-30 (×5): qty 1

## 2020-09-30 MED ORDER — HYDROMORPHONE HCL 1 MG/ML IJ SOLN
0.5000 mg | Freq: Once | INTRAMUSCULAR | Status: AC
Start: 2020-09-30 — End: 2020-09-30
  Administered 2020-09-30: 0.5 mg via INTRAVENOUS

## 2020-09-30 MED ORDER — SODIUM CHLORIDE 0.9 % IV SOLN: INTRAVENOUS | Status: DC

## 2020-09-30 MED ORDER — FENTANYL CITRATE (PF) 100 MCG/2ML IJ SOLN
INTRAMUSCULAR | Status: DC | PRN
  Administered 2020-09-30 (×2): 50 ug via INTRAVENOUS

## 2020-09-30 NOTE — Anesthesia Preprocedure Evaluation (Addendum)
Anesthesia Evaluation  Patient identified by MRN, date of birth, ID band Patient awake    Reviewed: Allergy & Precautions, NPO status , Patient's Chart, lab work & pertinent test results  Airway Mallampati: II  TM Distance: >3 FB Neck ROM: Full    Dental  (+) Partial Upper   Pulmonary    breath sounds clear to auscultation       Cardiovascular hypertension, Pt. on medications  Rhythm:Regular Rate:Normal     Neuro/Psych  Headaches, PSYCHIATRIC DISORDERS Anxiety Depression  Neuromuscular disease    GI/Hepatic GERD  Medicated,(+) Cirrhosis       , Hepatitis -  Endo/Other  diabetes, Type 2, Oral Hypoglycemic Agents, Insulin Dependent  Renal/GU      Musculoskeletal   Abdominal Normal abdominal exam  (+)   Peds  Hematology   Anesthesia Other Findings   Reproductive/Obstetrics                           Anesthesia Physical Anesthesia Plan  ASA: III  Anesthesia Plan: General   Post-op Pain Management:    Induction: Intravenous  PONV Risk Score and Plan: 3 and Ondansetron, Midazolam and Dexamethasone  Airway Management Planned: Oral ETT  Additional Equipment: None  Intra-op Plan:   Post-operative Plan: Extubation in OR  Informed Consent: I have reviewed the patients History and Physical, chart, labs and discussed the procedure including the risks, benefits and alternatives for the proposed anesthesia with the patient or authorized representative who has indicated his/her understanding and acceptance.       Plan Discussed with: CRNA  Anesthesia Plan Comments:        Anesthesia Quick Evaluation

## 2020-09-30 NOTE — Discharge Instructions (Signed)
YOU HAD AN ENDOSCOPIC PROCEDURE TODAY: Refer to the procedure report and other information in the discharge instructions given to you for any specific questions about what was found during the examination. If this information does not answer your questions, please call [place] office at [phone] to clarify.   YOU SHOULD EXPECT: Some feelings of bloating in the abdomen. Passage of more gas than usual. Walking can help get rid of the air that was put into your GI tract during the procedure and reduce the bloating. If you had a lower endoscopy (such as a colonoscopy or flexible sigmoidoscopy) you may notice spotting of blood in your stool or on the toilet paper. Some abdominal soreness may be present for a day or two, also.  DIET: Your first meal following the procedure should be a light meal and then it is ok to progress to your normal diet. A half-sandwich or bowl of soup is an example of a good first meal. Heavy or fried foods are harder to digest and may make you feel nauseous or bloated. Drink plenty of fluids but you should avoid alcoholic beverages for 24 hours. If you had a esophageal dilation, please see attached instructions for diet.    ACTIVITY: Your care partner should take you home directly after the procedure. You should plan to take it easy, moving slowly for the rest of the day. You can resume normal activity the day after the procedure however YOU SHOULD NOT DRIVE, use power tools, machinery or perform tasks that involve climbing or major physical exertion for 24 hours (because of the sedation medicines used during the test).   SYMPTOMS TO REPORT IMMEDIATELY: A gastroenterologist can be reached at any hour. Please call [phone]  for any of the following symptoms:   Following upper endoscopy (EGD, EUS, ERCP, esophageal dilation) Vomiting of blood or coffee ground material  New, significant abdominal pain  New, significant chest pain or pain under the shoulder blades  Painful or  persistently difficult swallowing  New shortness of breath  Black, tarry-looking or red, bloody stools  FOLLOW UP:  If any biopsies were taken you will be contacted by phone or by letter within the next 1-3 weeks. Call [phone]  if you have not heard about the biopsies in 3 weeks.  Please also call with any specific questions about appointments or follow up tests.  

## 2020-09-30 NOTE — Anesthesia Procedure Notes (Signed)
Procedure Name: Intubation Date/Time: 09/30/2020 11:33 AM Performed by: Sharlette Dense, CRNA Patient Re-evaluated:Patient Re-evaluated prior to induction Oxygen Delivery Method: Circle system utilized Preoxygenation: Pre-oxygenation with 100% oxygen Induction Type: IV induction Ventilation: Mask ventilation without difficulty and Oral airway inserted - appropriate to patient size Laryngoscope Size: Sabra Heck and 2 Grade View: Grade I Tube type: Oral Tube size: 7.0 mm Number of attempts: 1 Airway Equipment and Method: Stylet Placement Confirmation: ETT inserted through vocal cords under direct vision,  positive ETCO2 and breath sounds checked- equal and bilateral Secured at: 21 cm Tube secured with: Tape Dental Injury: Teeth and Oropharynx as per pre-operative assessment

## 2020-09-30 NOTE — H&P (Signed)
History and Physical    HEIDIE KRALL ENI:[id number] DOB: 03-22-1966 DOA: 09/30/2020  PCP: [name], FNP   Patient coming from: Home  I have personally briefly reviewed patient's old medical records in [place]  Chief Complaint: Abdominal pain  HPI: CALLE Hicks is a 55 y.o. female with medical history significant for anxiety, depression, liver cirrhosis, hypertension, chronic respiratory failure on 2 L of oxygen at bedtime, hypertension who is status post ERCP for persistent bile leak following a cholecystectomy that was done on 08/12/19.  Patient had stents placed 2 months ago but continued to have bile leak and so had an ERCP done today with removal and exchange of biliary stent as well as sphincterotomy. Post procedure patient developed worsening abdominal pain mostly in the left upper quadrant, right upper as well as right lower quadrant.  She states that she had pain in the left upper quadrant which was initially a 4 x 10 in intensity following a fall a couple of days prior to her admission but now the intensity of her pain in the left upper quadrant is about an 8 x 10.  Abdominal pain is associated with nausea but no vomiting.  Patient received 100 mg of fentanyl while in recovery as well as 20 mg of Toradol without any significant improvement in her pain. Due to her history of post ERCP pancreatitis and concerns for possible complication postprocedure, observation was requested for further evaluation. She denies having any chest pain, no shortness of breath, no dizziness, no lightheadedness, no headache, no palpitations, no diaphoresis, no urinary symptoms, no blurred vision, no weakness Labs are still pending at the time of this H&P Abdominal x-ray shows interval placement of metallic wall stent adjacent to a residual plastic stent in the common bile duct. Cholecystostomy tube remains in place. No acute intra-abdominal abnormality is noted. Chest x-ray reviewed by me shows  clear lungs     ED Course: N/A  Review of Systems: As per HPI otherwise all other systems reviewed and negative.    Past Medical History:  Diagnosis Date  . Anxiety   . Cataract   . Cirrhosis of liver ([place])   . Common migraine with intractable migraine 08/07/2020  . Depression   . Diabetes mellitus without complication ([place])   . Dysrhythmia   . Edema   . Family history of adverse reaction to anesthesia    mother had trouble waking up after surgery  . Foot fracture, left   . Headache    MIGRAINES  . Hyperlipidemia   . Hypertension   . Neuromuscular disorder ([place])    NEUROPATHY-both hands and feet  . On supplemental oxygen by nasal cannula    2L at bedtime only  . Palpitations   . Wheezing     Past Surgical History:  Procedure Laterality Date  . ABDOMINAL HYSTERECTOMY     one ovary left  . BILIARY STENT PLACEMENT N/A 08/07/2019   Procedure: BILIARY STENT PLACEMENT;  Surgeon: [name], MD;  Location: [facility];  Service: Endoscopy;  Laterality: N/A;  . BILIARY STENT PLACEMENT N/A 11/07/2019   Procedure: BILIARY STENT PLACEMENT;  Surgeon: [name], MD;  Location: [facility];  Service: Endoscopy;  Laterality: N/A;  . BILIARY STENT PLACEMENT N/A 05/13/2020   Procedure: BILIARY STENT PLACEMENT;  Surgeon: [name], MD;  Location: [facility];  Service: Endoscopy;  Laterality: N/A;  . BILIARY STENT PLACEMENT N/A 05/27/2020   Procedure: BILIARY STENT PLACEMENT;  Surgeon: Lucio Edward  T, MD;  Location: [facility];  Service: Endoscopy;  Laterality: N/A;  . BILIARY STENT PLACEMENT N/A 06/26/2020   Procedure: BILIARY STENT PLACEMENT;  Surgeon: [name], MD;  Location: [facility];  Service: Gastroenterology;  Laterality: N/A;  . BIOPSY  06/26/2020   Procedure: BIOPSY;  Surgeon: [name], MD;  Location: [facility];  Service: Gastroenterology;;  . CATARACT EXTRACTION W/PHACO Right 01/11/2018   Procedure: CATARACT EXTRACTION  PHACO AND INTRAOCULAR LENS PLACEMENT ([place]);  Surgeon: [name], MD;  Location: [facility];  Service: Ophthalmology;  Laterality: Right;  [place] 00:57.5 AP% 15.0 CDE 8.61 Fluid Pack Lot # [id number]  . CATARACT EXTRACTION W/PHACO Left 02/15/2018   Procedure: CATARACT EXTRACTION PHACO AND INTRAOCULAR LENS PLACEMENT (IOC);  Surgeon: [name], MD;  Location: [facility];  Service: Ophthalmology;  Laterality: Left;  [place] 00:51 AP% 13.2 CDE 6.82 Fluid p ack lot # [id number]  . CHOLECYSTECTOMY N/A 08/04/2019   Procedure: LAPAROSCOPIC CHOLECYSTECTOMY;  Surgeon: [name], MD;  Location: [facility];  Service: General;  Laterality: N/A;  . COLONOSCOPY    . ENDOSCOPIC RETROGRADE CHOLANGIOPANCREATOGRAPHY (ERCP) WITH PROPOFOL N/A 11/07/2019   Procedure: ENDOSCOPIC RETROGRADE CHOLANGIOPANCREATOGRAPHY (ERCP) WITH PROPOFOL;  Surgeon: [name], MD;  [facility]: [facility];  Service: Endoscopy;  Laterality: N/A;  . ENDOSCOPIC RETROGRADE CHOLANGIOPANCREATOGRAPHY (ERCP) WITH PROPOFOL N/A 05/13/2020   Procedure: ENDOSCOPIC RETROGRADE CHOLANGIOPANCREATOGRAPHY (ERCP) WITH PROPOFOL;  Surgeon: [name], MD;  [facility];  Service: Endoscopy;  Laterality: N/A;  . ENDOSCOPIC RETROGRADE CHOLANGIOPANCREATOGRAPHY (ERCP) WITH PROPOFOL N/A 06/26/2020   Procedure: ENDOSCOPIC RETROGRADE CHOLANGIOPANCREATOGRAPHY (ERCP) WITH PROPOFOL;  Surgeon: [name], MD;  [facility]: [facility];  Service: Gastroenterology;  Laterality: N/A;  . ERCP N/A 08/07/2019   Procedure: ENDOSCOPIC RETROGRADE CHOLANGIOPANCREATOGRAPHY (ERCP);  Surgeon: [name], MD;  Location: [facility];  Service: Endoscopy;  Laterality: N/A;  . ERCP N/A 05/27/2020   Procedure: ENDOSCOPIC RETROGRADE CHOLANGIOPANCREATOGRAPHY (ERCP);  Surgeon: [name], MD;  Location: [facility];  Service: Endoscopy;  Laterality: N/A;  . ESOPHAGOGASTRODUODENOSCOPY N/A 06/26/2020   Procedure: ESOPHAGOGASTRODUODENOSCOPY (EGD);   Surgeon: [name]., MD;  Location: [facility];  Service: Gastroenterology;  Laterality: N/A;  . IR EXCHANGE BILIARY DRAIN  07/10/2020  . IR EXCHANGE BILIARY DRAIN  08/28/2020  . IR RADIOLOGIST EVAL & MGMT  06/20/2020  . RCR Bilateral   . REMOVAL OF STONES  08/07/2019   Procedure: REMOVAL OF STONES;  Surgeon: [name], MD;  [facility];  Service: Endoscopy;;  balloon sweep, no stones  . REMOVAL OF STONES  05/27/2020   Procedure: REMOVAL OF STONES;  Surgeon: [name], MD;  [facility]: [facility];  Service: Endoscopy;;  . REMOVAL OF STONES  06/26/2020   Procedure: REMOVAL OF STONES;  Surgeon: [name]., MD;  Location: [facility];  Service: Gastroenterology;;  . [name]  08/07/2019   Procedure: SPHINCTEROTOMY;  Surgeon: [name], MD;  [facility]: [facility];  Service: Endoscopy;;  . STENT REMOVAL  11/07/2019   Procedure: STENT REMOVAL;  Surgeon: [name], MD;  [facility]: [facility];  Service: Endoscopy;;  . STENT REMOVAL  05/13/2020   Procedure: STENT REMOVAL;  Surgeon: [name], MD;  [facility];  Service: Endoscopy;;  . STENT REMOVAL  05/27/2020   Procedure: STENT REMOVAL;  Surgeon: [name], MD;  [facility];  Service: Endoscopy;;  . STENT REMOVAL  06/26/2020   Procedure: STENT REMOVAL;  Surgeon: [name]., MD;  Location:  [facility];  Service: Gastroenterology;;     reports that she has never smoked. She has never used smokeless tobacco. She reports that she does not drink alcohol and does not use drugs.  Allergies  Allergen Reactions  . Talwin [Pentazocine]     Turned blue around lips and face, rash all over body    Family History  Problem Relation Age of Onset  . Diabetes Mother   . Heart failure Mother   . Depression Mother   . Colon cancer Neg Hx   . Stomach cancer Neg Hx   . Esophageal cancer Neg Hx   . Pancreatic cancer Neg Hx       Prior to  Admission medications   Medication Sig Start Date End Date Taking? Authorizing Provider  acetaminophen (TYLENOL) 500 MG tablet Take 1,000 mg by mouth every 6 (six) hours as needed for moderate pain.   Yes [provider]  AIMOVIG 140 MG/ML SOAJ Inject 140 mg into the skin every 30 (thirty) days. 09/10/20  Yes [provider]  ALPRAZolam Duanne Moron) 0.5 MG tablet Take 0.5 mg by mouth 2 (two) times daily as needed for anxiety. 08/30/20  Yes [provider]  brexpiprazole (REXULTI) 2 MG TABS tablet Take 2 mg by mouth at bedtime.   Yes [provider]  Dulaglutide 4.5 MG/0.5ML SOPN Inject 4.5 mg into the skin every Monday. 12/20/18  Yes [provider]  Empagliflozin-metFORMIN HCl ER (SYNJARDY XR) 25-1000 MG TB24 Take 1 tablet by mouth daily before breakfast.  11/15/18  Yes [provider]  ezetimibe (ZETIA) 10 MG tablet Take 10 mg by mouth daily. 04/25/20  Yes [provider]  Insulin Glargine (BASAGLAR KWIKPEN) 100 UNIT/ML Inject 40 Units into the skin at bedtime. 03/20/20  Yes [provider]  lactulose (CHRONULAC) 10 GM/15ML solution Take 15 mLs (10 g total) by mouth 3 (three) times daily as needed for mild constipation. Patient taking differently: Take 10 g by mouth 3 (three) times daily. 09/09/20  Yes Willia Craze, NP  linaclotide [facility]) 290 MCG CAPS capsule Take 1 capsule (290 mcg total) by mouth daily before breakfast. 08/30/20  Yes [name], MD  lisinopril (ZESTRIL) 20 MG tablet Take 20 mg by mouth daily.   Yes [provider]  meloxicam (MOBIC) 15 MG tablet Take 15 mg by mouth daily.   Yes [provider]  metFORMIN (GLUCOPHAGE) 1000 MG tablet Take 1,000 mg by mouth at bedtime. 02/27/20  Yes [provider]  ondansetron (ZOFRAN-ODT) 4 MG disintegrating tablet Take 4 mg by mouth every 8 (eight) hours as needed for nausea or vomiting.   Yes [provider]  oxyCODONE (ROXICODONE) 5 MG  immediate release tablet Take 1 tablet (5 mg total) by mouth every 6 (six) hours as needed for severe pain. 09/30/20  Yes Mansouraty, Telford Nab., MD  pantoprazole (PROTONIX) 40 MG tablet Take 40 mg by mouth daily. 08/19/20  Yes [provider]  prazosin (MINIPRESS) 2 MG capsule Take 4 mg by mouth at bedtime.   Yes [provider]  pregabalin (LYRICA) 100 MG capsule One capsule in the morning and midday, 2 in the evening Patient taking differently: Take 100 mg by mouth See admin instructions. Take 100 mg   in the morning and midday, 200 mg in the evening 08/07/20  Yes Kathrynn Ducking, MD  QUEtiapine (SEROQUEL) 400 MG tablet Take 0.5 tablets (200 mg total) by mouth at bedtime. Patient taking differently: Take 800 mg by  mouth at bedtime. 07/13/20  Yes [place] Eric J, DO  QUEtiapine (SEROQUEL) 50 MG tablet Take 100 mg by mouth every morning. 08/30/20  Yes [provider]  simvastatin (ZOCOR) 10 MG tablet Take 10 mg by mouth daily. 12/13/17  Yes [provider]  traMADol ([name]) 50 MG tablet Take 50 mg by mouth 2 (two) times daily. 08/24/20  Yes [provider]  vortioxetine HBr ([name]) 20 MG TABS tablet Take 20 mg by mouth at bedtime.   Yes [provider]  ziprasidone ([name]) 20 MG capsule Take 20 mg by mouth at bedtime. 08/30/20  Yes [provider]  Ferrous Sulfate (IRON) 325 (65 Fe) MG TABS Take 1 tablet (325 mg total) by mouth 2 (two) times daily after a meal. Patient not taking: Reported on 09/19/2020 08/28/20   [name], MD  sodium chloride flush (NS) 0.9 % SOLN Inject 5 mLs into the vein every 12 (twelve) hours. 07/13/20 10/11/20  [place] Eric J, DO    Physical Exam: Vitals:   09/30/20 1530 09/30/20 1540 09/30/20 1545 09/30/20 1550  BP: (!) 183/149 (!) 177/162 (!) 155/77 (!) 171/77  Pulse: 81 82 83 83  Resp: 19 19 15 13   Temp:      TempSrc:      SpO2: 100% 98% 99% 99%  Weight:      Height:         Vitals:   09/30/20  1530 09/30/20 1540 09/30/20 1545 09/30/20 1550  BP: (!) 183/149 (!) 177/162 (!) 155/77 (!) 171/77  Pulse: 81 82 83 83  Resp: 19 19 15 13   Temp:      TempSrc:      SpO2: 100% 98% 99% 99%  Weight:      Height:          Constitutional: Alert and oriented x 3 .  Appears uncomfortable HEENT:      Head: Normocephalic and atraumatic.         Eyes: PERLA, EOMI, Conjunctivae are normal. Sclera is non-icteric.       Mouth/Throat: Mucous membranes are moist.       Neck: Supple with no signs of meningismus. Cardiovascular: Regular rate and rhythm. No murmurs, gallops, or rubs. 2+ symmetrical distal pulses are present . No JVD. No LE edema Respiratory: Respiratory effort normal .Lungs sounds clear bilaterally. No wheezes, crackles, or rhonchi.  Gastrointestinal: Soft, tender left upper quadrant, right upper quadrant and right lower quadrant, distended, hypoactive bowel sounds  Genitourinary: No CVA tenderness. Musculoskeletal: Nontender with normal range of motion in all extremities. No cyanosis, or erythema of extremities. Neurologic:  Face is symmetric. Moving all extremities. No gross focal neurologic deficits  Skin: Skin is warm, dry.  No rash or ulcers Psychiatric: Mood and affect are normal   Labs on Admission: I have personally reviewed following labs and imaging studies  CBC: No results for input(s): WBC, NEUTROABS, HGB, HCT, MCV, PLT in the last 168 hours. Basic Metabolic Panel: No results for input(s): NA, K, CL, CO2, GLUCOSE, BUN, CREATININE, CALCIUM, MG, PHOS in the last 168 hours. GFR: CrCl cannot be calculated (Patient's most recent lab result is older than the maximum 21 days allowed.). Liver Function Tests: No results for input(s): AST, ALT, ALKPHOS, BILITOT, PROT, ALBUMIN in the last 168 hours. No results for input(s): LIPASE, AMYLASE in the last 168 hours. No results for input(s): AMMONIA in the last 168 hours. Coagulation Profile: No results for input(s): INR, PROTIME  in the last 168 hours.  Cardiac Enzymes: No results for input(s): CKTOTAL, CKMB, CKMBINDEX, TROPONINI in the last 168 hours. BNP (last 3 results) No results for input(s): PROBNP in the last 8760 hours. HbA1C: No results for input(s): HGBA1C in the last 72 hours. CBG: Recent Labs  Lab 09/30/20 1048  GLUCAP 178*   Lipid Profile: No results for input(s): CHOL, HDL, LDLCALC, TRIG, CHOLHDL, LDLDIRECT in the last 72 hours. Thyroid Function Tests: No results for input(s): TSH, T4TOTAL, FREET4, T3FREE, THYROIDAB in the last 72 hours. Anemia Panel: No results for input(s): VITAMINB12, FOLATE, FERRITIN, TIBC, IRON, RETICCTPCT in the last 72 hours. Urine analysis:    Component Value Date/Time   COLORURINE STRAW (A) 06/27/2020 1658   APPEARANCEUR CLEAR 06/27/2020 1658   LABSPEC 1.029 06/27/2020 1658   PHURINE 6.0 06/27/2020 1658   GLUCOSEU >=500 (A) 06/27/2020 1658   HGBUR NEGATIVE 06/27/2020 1658   BILIRUBINUR NEGATIVE 06/27/2020 1658   KETONESUR NEGATIVE 06/27/2020 1658   PROTEINUR NEGATIVE 06/27/2020 1658   NITRITE NEGATIVE 06/27/2020 1658   LEUKOCYTESUR NEGATIVE 06/27/2020 1658    Radiological Exams on Admission: DG Chest Port 1 View  Result Date: 09/30/2020 CLINICAL DATA:  Recent ERCP with pain, initial encounter EXAM: PORTABLE CHEST 1 VIEW COMPARISON:  07/08/2020 FINDINGS: Cardiac shadow is at the upper limits of normal in size. Mild bibasilar atelectasis is noted. The lungs are otherwise clear. No bony abnormality is seen. IMPRESSION: Mild bibasilar atelectasis. Electronically Signed   By: Inez Catalina M.D.   On: 09/30/2020 15:44   DG ERCP  Result Date: 09/30/2020 CLINICAL DATA:  55 year old female with history of cholecystectomy and postoperative bile leak EXAM: ERCP TECHNIQUE: Multiple spot images obtained with the fluoroscopic device and submitted for interpretation post-procedure. FLUOROSCOPY TIME:  Fluoroscopy Time:  8 minutes 15 seconds COMPARISON:  08/28/2020 FINDINGS:  Limited intraoperative fluoroscopic spot images during ERCP. Initial image demonstrates the endoscope projecting over the upper abdomen with plastic biliary stents in place. Subsequently there is removal of a plastic biliary stents and then placement of a safety wire cross the ampulla. Retrograde infusion of contrast. There is deployment of a retrieval balloon. Images also demonstrate contrast accumulating in extra lumen location adjacent tube pigtail drainage catheter. Final image demonstrates deployment of parallel metallic biliary stent and plastic biliary stent. IMPRESSION: Limited intraoperative fluoroscopic spot images during ERCP demonstrates treatment of ongoing biliary leak with exchange of plastic biliary stents for new parallel plastic and metallic biliary stents and deployment of retrieval balloon. Please refer to the dictated operative report for full details of intraoperative findings and procedure. Electronically Signed   By: Corrie Mckusick D.O.   On: 09/30/2020 13:24   DG Abd Portable 2V  Result Date: 09/30/2020 CLINICAL DATA:  Abdominal pain following ERCP EXAM: PORTABLE ABDOMEN - 2 VIEW COMPARISON:  ERCP filled films from earlier in the same day, prior plain film from 09/17/2020 FINDINGS: Scattered large and small bowel gas is noted. No free air is seen. Two of the previously seen 3 common bile duct stents have been removed in the interval. A new metallic wall stent is noted adjacent to the residual plastic stent. Cholecystostomy tube is noted in the right upper quadrant. No other focal abnormality is noted. IMPRESSION: Interval placement of metallic wall stent adjacent to a residual plastic stent in the common bile duct. Cholecystostomy tube remains in place. No acute intra-abdominal abnormality is noted. Electronically Signed   By: Inez Catalina M.D.   On: 09/30/2020 15:43     Assessment/Plan Principal Problem:  Abdominal pain Active Problems:   Diabetes mellitus without complication  (HCC)   Hypertension   Bile leak     Abdominal pain Post ERCP for bile leak following cholecystectomy Concern for possible post ERCP pancreatitis versus complication from procedure Pain control and antiemetics Keep patient n.p.o. for now Hold oral medications for now Obtain lipase levels We will obtain we will obtain CT scan of abdomen and pelvis if abdominal pain does not improve for further evaluation GI consult   Diabetes mellitus Hold oral hypoglycemic agents Sliding scale coverage with insulin Accu-Cheks every 4 hours    Depression Hold all antipsychotics and anxiolytics for now    Hypertension We will treat patient with IV hydralazine  DVT prophylaxis: SCD Code Status: full code Family Communication: Greater than 50% of time was spent discussing patient's condition and plan of care with her and her husband at bedside.  All questions and concerns have been addressed.  They verbalized understanding and agree with the plan. Disposition Plan: Back to previous home environment Consults called: Gastroenterology Status: Observation    Vangie Henthorn MD [facility]     09/30/2020, 3:55 PM

## 2020-09-30 NOTE — H&P (Signed)
GASTROENTEROLOGY PROCEDURE H&P NOTE   Primary Care Physician: [name], FNP  HPI: Kim Hicks is a 55 y.o. female who presents for ERCP for persistent bile leak post plastic stenting.  Past Medical History:  Diagnosis Date  . Anxiety   . Cataract   . Cirrhosis of liver ([place])   . Common migraine with intractable migraine 08/07/2020  . Depression   . Diabetes mellitus without complication ([place])   . Dysrhythmia   . Edema   . Family history of adverse reaction to anesthesia    mother had trouble waking up after surgery  . Foot fracture, left   . Headache    MIGRAINES  . Hyperlipidemia   . Hypertension   . Neuromuscular disorder ([place])    NEUROPATHY-both hands and feet  . On supplemental oxygen by nasal cannula    2L at bedtime only  . Palpitations   . Wheezing    Past Surgical History:  Procedure Laterality Date  . ABDOMINAL HYSTERECTOMY     one ovary left  . BILIARY STENT PLACEMENT N/A 08/07/2019   Procedure: BILIARY STENT PLACEMENT;  Surgeon: [name], MD;  [facility]: [facility];  Service: Endoscopy;  Laterality: N/A;  . BILIARY STENT PLACEMENT N/A 11/07/2019   Procedure: BILIARY STENT PLACEMENT;  Surgeon: [name], MD;  [facility];  Service: Endoscopy;  Laterality: N/A;  . BILIARY STENT PLACEMENT N/A 05/13/2020   Procedure: BILIARY STENT PLACEMENT;  Surgeon: [name], MD;  [facility];  Service: Endoscopy;  Laterality: N/A;  . BILIARY STENT PLACEMENT N/A 05/27/2020   Procedure: BILIARY STENT PLACEMENT;  Surgeon: [name], MD;  [facility];  Service: Endoscopy;  Laterality: N/A;  . BILIARY STENT PLACEMENT N/A 06/26/2020   Procedure: BILIARY STENT PLACEMENT;  Surgeon: [name], MD;  [facility];  Service: Gastroenterology;  Laterality: N/A;  . BIOPSY  06/26/2020   Procedure: BIOPSY;  Surgeon: [name], MD;  Location: [facility];  Service: Gastroenterology;;  .  CATARACT EXTRACTION W/PHACO Right 01/11/2018   Procedure: CATARACT EXTRACTION PHACO AND INTRAOCULAR LENS PLACEMENT ([place]);  Surgeon: [name], MD;  Location: [facility];  Service: Ophthalmology;  Laterality: Right;  [place] 00:57.5 AP% 15.0 CDE 8.61 Fluid Pack Lot # [id number]  . CATARACT EXTRACTION W/PHACO Left 02/15/2018   Procedure: CATARACT EXTRACTION PHACO AND INTRAOCULAR LENS PLACEMENT ([facility]);  Surgeon: [name], MD;  Location: [facility];  Service: Ophthalmology;  Laterality: Left;  [place] 00:51 AP% 13.2 CDE 6.82 Fluid p ack lot # [id number]  . CHOLECYSTECTOMY N/A 08/04/2019   Procedure: LAPAROSCOPIC CHOLECYSTECTOMY;  Surgeon: [name], MD;  [facility]: [facility];  Service: General;  Laterality: N/A;  . COLONOSCOPY    . ENDOSCOPIC RETROGRADE CHOLANGIOPANCREATOGRAPHY (ERCP) WITH PROPOFOL N/A 11/07/2019   Procedure: ENDOSCOPIC RETROGRADE CHOLANGIOPANCREATOGRAPHY (ERCP) WITH PROPOFOL;  Surgeon: [name], MD;  [facility];  Service: Endoscopy;  Laterality: N/A;  . ENDOSCOPIC RETROGRADE CHOLANGIOPANCREATOGRAPHY (ERCP) WITH PROPOFOL N/A 05/13/2020   Procedure: ENDOSCOPIC RETROGRADE CHOLANGIOPANCREATOGRAPHY (ERCP) WITH PROPOFOL;  Surgeon: [name], MD;  [facility];  Service: Endoscopy;  Laterality: N/A;  . ENDOSCOPIC RETROGRADE CHOLANGIOPANCREATOGRAPHY (ERCP) WITH PROPOFOL N/A 06/26/2020   Procedure: ENDOSCOPIC RETROGRADE CHOLANGIOPANCREATOGRAPHY (ERCP) WITH PROPOFOL;  Surgeon: [name], MD;  [facility];  Service: Gastroenterology;  Laterality: N/A;  . ERCP N/A 08/07/2019   Procedure: ENDOSCOPIC RETROGRADE CHOLANGIOPANCREATOGRAPHY (ERCP);  Surgeon: [name], MD;  Location: [facility];  Service: Endoscopy;  Laterality: N/A;  . ERCP N/A 05/27/2020   Procedure: ENDOSCOPIC RETROGRADE CHOLANGIOPANCREATOGRAPHY (ERCP);  Surgeon: [name], MD;  Location: [facility];  Service: Endoscopy;  Laterality: N/A;  .  ESOPHAGOGASTRODUODENOSCOPY N/A 06/26/2020   Procedure: ESOPHAGOGASTRODUODENOSCOPY (EGD);  Surgeon: [name]., MD;  Location: [facility];  Service: Gastroenterology;  Laterality: N/A;  . IR EXCHANGE BILIARY DRAIN  07/10/2020  . IR EXCHANGE BILIARY DRAIN  08/28/2020  . IR RADIOLOGIST EVAL & MGMT  06/20/2020  . RCR Bilateral   . REMOVAL OF STONES  08/07/2019   Procedure: REMOVAL OF STONES;  Surgeon: [name], MD;  [facility];  Service: Endoscopy;;  balloon sweep, no stones  . REMOVAL OF STONES  05/27/2020   Procedure: REMOVAL OF STONES;  Surgeon: [name], MD;  [facility];  Service: Endoscopy;;  . REMOVAL OF STONES  06/26/2020   Procedure: REMOVAL OF STONES;  Surgeon: [name]., MD;  Location: [facility];  Service: Gastroenterology;;  . [name]  08/07/2019   Procedure: SPHINCTEROTOMY;  Surgeon: [name], MD;  [facility];  Service: Endoscopy;;  . STENT REMOVAL  11/07/2019   Procedure: STENT REMOVAL;  Surgeon: [name], MD;  [facility];  Service: Endoscopy;;  . STENT REMOVAL  05/13/2020   Procedure: STENT REMOVAL;  Surgeon: [name], MD;  [facility];  Service: Endoscopy;;  . STENT REMOVAL  05/27/2020   Procedure: STENT REMOVAL;  Surgeon: [name], MD;  [facility];  Service: Endoscopy;;  . STENT REMOVAL  06/26/2020   Procedure: STENT REMOVAL;  Surgeon: [name]., MD;  [facility];  Service: Gastroenterology;;   No current facility-administered medications for this encounter.   Allergies  Allergen Reactions  . Talwin [Pentazocine]     Turned blue around lips and face, rash all over body   Family History  Problem Relation Age of Onset  . Diabetes Mother   . Heart failure Mother   . Depression Mother   . Colon cancer Neg Hx   . Stomach cancer Neg Hx   . Esophageal cancer Neg Hx   . Pancreatic cancer Neg Hx    Social  History   Socioeconomic History  . Marital status: Married    Spouse name: Not on file  . Number of children: Not on file  . Years of education: Not on file  . Highest education level: Not on file  Occupational History  . Not on file  Tobacco Use  . Smoking status: Never Smoker  . Smokeless tobacco: Never Used  Vaping Use  . Vaping Use: Never used  Substance and Sexual Activity  . Alcohol use: Never  . Drug use: Never  . Sexual activity: Not Currently  Other Topics Concern  . Not on file  Social History Narrative   Lives with niece and husband   Right handed   Drinks 4-5 cups daily   Social Determinants of Health   Financial Resource Strain: Not on file  Food Insecurity: Not on file  Transportation Needs: Not on file  Physical Activity: Not on file  Stress: Not on file  Social Connections: Not on file  Intimate Partner Violence: Not on file    Physical Exam: Vital signs in last 24 hours:     GEN: NAD EYE: Sclerae anicteric ENT: MMM CV: Non-tachycardic GI: Soft, protuberant abdomen, TTP throughout 7-8/10 pain currently NEURO:  Alert & Oriented x 3  Lab Results: No results for  input(s): WBC, HGB, HCT, PLT in the last 72 hours. BMET No results for input(s): NA, K, CL, CO2, GLUCOSE, BUN, CREATININE, CALCIUM in the last 72 hours. LFT No results for input(s): PROT, ALBUMIN, AST, ALT, ALKPHOS, BILITOT, BILIDIR, IBILI in the last 72 hours. PT/INR No results for input(s): LABPROT, INR in the last 72 hours.   Impression / Plan: This is a 55 y.o.female who presents for ERCP for persistent bile leak post plastic stenting.  The risks and benefits of endoscopic evaluation were discussed with the patient; these include but are not limited to the risk of perforation, infection, bleeding, missed lesions, lack of diagnosis, severe illness requiring hospitalization, as well as anesthesia and sedation related illnesses.  The patient is agreeable to proceed.    Justice Britain, MD [facility] Office # [phone]

## 2020-09-30 NOTE — Op Note (Signed)
[facility] Patient Name: Kim Hicks Procedure Date: 09/30/2020 MRN: [id number] Attending MD: Justice Britain , MD Date of Birth: [birth year] CSN: [id number] Age: 55 Admit Type: Outpatient Procedure:                ERCP Indications:              Bile leak, Generalized abdominal pain Providers:                Justice Britain, MD, Baird Cancer, RN, Laverda Sorenson, Technician, Danley Danker, CRNA Referring MD:             Pricilla Riffle. Fuller Plan, MD, Chelsea A. Sharyn Blitz                            Wells, Dr. Anselm Pancoast (Interventional Radiology) Medicines:                General Anesthesia, Indomethacin 100 mg PR, Cipro                            400 mg IV, Glucagon 6.94 mg IV Complications:            No immediate complications. Estimated Blood Loss:     Estimated blood loss was minimal. Procedure:                Pre-Anesthesia Assessment:                           - Prior to the procedure, a History and Physical                            was performed, and patient medications and                            allergies were reviewed. The patient's tolerance of                            previous anesthesia was also reviewed. The risks                            and benefits of the procedure and the sedation                            options and risks were discussed with the patient.                            All questions were answered, and informed consent                            was obtained. Prior Anticoagulants: The patient has                            taken no previous anticoagulant or antiplatelet  agents. ASA Grade Assessment: III - A patient with                            severe systemic disease. After reviewing the risks                            and benefits, the patient was deemed in                            satisfactory condition to undergo the procedure.                           After obtaining  informed consent, the scope was                            passed under direct vision. Throughout the                            procedure, the patient's blood pressure, pulse, and                            oxygen saturations were monitored continuously. The                            Olympus TJF-Q180V [id number]) was introduced through                            the mouth, and used to inject contrast into and                            used to inject contrast into the bile duct. The                            ERCP was accomplished without difficulty. The                            patient tolerated the procedure. Scope In: Scope Out: Findings:      A scout film of the abdomen was obtained. Surgical clips, consistent       with previous cholecystectomy, were seen in the area of the right upper       quadrant of the abdomen. One percutaneous drain ending in the Right       upper quadrant was seen. Four stents ending in the main bile duct were       seen.      The upper GI tract was traversed under direct vision without detailed       examination. Patchy moderately erythematous mucosa without bleeding was       found in the entire examined stomach - previously biopsied. No gross       lesions were noted in the duodenal bulb, in the first portion of the       duodenum and in the second portion of the duodenum. A biliary       sphincterotomy had been performed. The sphincterotomy appeared open.       Four plastic  biliary stents originating in the biliary tree were       emerging from the major papilla. The stents were partially occluded and       had migrated downwards somewhat, but still present within the CBD. Four       stents were removed from the biliary tree using a combination of a       rat-toothed forceps and snare.      A short 0.025 inch Revolution Antonietta Breach was passed into the biliary tree       and what seemed to be the cystic duct. I left this wire in place. A       second short  0.025 inch Revolution Jagwire was then able to be passed       into the proximal bile duct. The Jagtome sphincterotome was passed over       the guidewire and the bile duct was then deeply cannulated. Contrast was       injected. I personally interpreted the bile duct images. Ductal flow of       contrast was adequate. Image quality was adequate. Contrast extended to       the bifurcation proximally. Opacification of the entire biliary tree       except for the gallbladder was successful. The upper third of the main       bile duct and left and right hepatic ducts and all intrahepatic branches       were normal. The lower third of the main bile duct was mildly dilated -       query from the previous 4 plastic stents being in place. The largest       diameter was 10 mm. The lower third of the main bile duct contained       filling defects thought to be sludge. Extravasation of contrast       originating from the cystic duct was observed and this is found quite       low within the CBD (approximately 2 cm from the ampulla. To discover       objects, the biliary tree was swept with a retrieval balloon starting at       the bifurcation. Sludge was swept from the duct. To discover objects,       the biliary tree was swept with a retrieval balloon starting at the       cystic duct. Sludge was swept from the duct. One stone was removed. No       stones remained. An occlusion cholangiogram was performed that showed no       further significant biliary pathology within the CBD other than       extravasation of contrast originating from the cystic duct still being       observed.      The patient has previously had migration of a 10 mm FCSEMS and she has       failed 4 plastic stents as well. I made a decision to try an attempt at       cystic duct stenting and placement of a FCSEMS into the CBD in effort of       trying to decrease migration and help this persistent leak (now >1 year       old). One  8.5 Fr by 5 cm plastic biliary stent with a single external       flap and a single internal flap was placed into the cystic duct  carefully. Bile flowed through the stent. The stent was in good       position. One 10 mm by 4 cm [place] fully covered metal biliary stent was       placed into the common bile duct. Bile flowed through the stent. The       stent was in good position.      An occlusion cholangiogram through the FCSEMS was performed that showed       no further significant biliary pathology and no evidence of persistent       leak was present.      A pancreatogram was not performed.      The duodenoscope was withdrawn from the patient. Impression:               - Erythematous mucosa in the stomach - previously                            biopsied.                           - No gross lesions in the duodenal bulb, in the                            first portion of the duodenum and in the second                            portion of the duodenum.                           - Prior biliary sphincterotomy appeared open. Four                            partially occluded stents from the biliary tree                            were seen in the major papilla - partially migrated                            downwards though still within the CBD. The stents                            were removed.                           - The fluoroscopic examination was suspicious for                            sludge of the biliary tree.                           - The lower third of the main bile duct was mildly                            dilated.                           -  Wire placed into the cystic duct as well.                           - A bile leak was found from a low-lying cystic                            duct.                           - Choledocholithiasis was found within the cystic                            duct and complete removal was accomplished by                             sweeping. Biliary sludge and debris within the main                            CBD was removed by sweeping.                           - One plastic biliary stent was placed into the                            cystic duct.                           - One fully covered metal biliary stent was placed                            into the common bile duct.                           - Completion cholangiogram did not show persistent                            leakage. Moderate Sedation:      Not Applicable - Patient had care per Anesthesia. Recommendation:           - The patient will be observed post-procedure,                            until all discharge criteria are met.                           - Discharge patient to home.                           - Patient has a contact number available for                            emergencies. The signs and symptoms of potential                            delayed complications were discussed with the  patient. Return to normal activities tomorrow.                            Written discharge instructions were provided to the                            patient.                           - Low fat diet.                           - Observe patient's clinical course.                           - Check liver enzymes (AST, ALT, alkaline                            phosphatase, bilirubin) in 1 week.                           - Repeat ERCP in 3 months to exchange stent.                           - Watch for pancreatitis, bleeding, perforation,                            and cholangitis.                           - Short course of Oral Opioids to be given for                            patient, though she and family understand that we                            will not be continuing this post-procedurally, and                            any chronic pain medications will need to be                            prescribed by PCP or by  Pain team.                           - If patient fails this attempt at bile leak                            closure, then would ask IR if Cystic duct                            embolization via coils/glue could be possible vs                            patient being referred to  surgery for consideration                            of Bile Duct reconstruction. If patient/family want                            another opinion from [facility] elsewhere,                            we can help with facilitating this (but with prior                            plastic, FCSEMS, 4 plastic stents, and now what we                            have done, I am not sure what else would be                            offered/trialed).                           - The findings and recommendations were discussed                            with the patient.                           - The findings and recommendations were discussed                            with the patient's family. Procedure Code(s):        --- Professional ---                           [phone], Endoscopic retrograde                            cholangiopancreatography (ERCP); with removal and                            exchange of stent(s), biliary or pancreatic duct,                            including pre- and post-dilation and guide wire                            passage, when performed, including sphincterotomy,                            when performed, each stent exchanged                           43276, 59, Endoscopic retrograde                            cholangiopancreatography (ERCP); with removal  and                            exchange of stent(s), biliary or pancreatic duct,                            including pre- and post-dilation and guide wire                            passage, when performed, including sphincterotomy,                            when performed, each stent exchanged                           43264,  Endoscopic retrograde                            cholangiopancreatography (ERCP); with removal of                            calculi/debris from biliary/pancreatic duct(s) Diagnosis Code(s):        --- Professional ---                           K31.89, Other diseases of stomach and duodenum                           T85.590A, Other mechanical complication of bile                            duct prosthesis, initial encounter                           K83.8, Other specified diseases of biliary tract                           K83.9, Disease of biliary tract, unspecified                           K80.50, Calculus of bile duct without cholangitis                            or cholecystitis without obstruction                           Z46.59, Encounter for fitting and adjustment of                            other gastrointestinal appliance and device                           R10.84, Generalized abdominal pain CPT copyright 2019 American Medical Association. All rights reserved. The codes documented in this report are preliminary and upon coder review may  be revised to meet current compliance requirements. Justice Britain, MD 09/30/2020 1:30:03 PM Number of Addenda: 0

## 2020-09-30 NOTE — Progress Notes (Signed)
Patient evaluated for potential discharge. Patient having discomfort but stable. Reviewed results of ERCP. Patient agreeable with short course opioid therapy to be administered for potential discharge. Patient discharged via computer.  Called by Endo team that patient having progressive abdominal discomfort with continued fentanyl use up to 100 mcg without significant improvement leading to Toradol use.  Patient x-rays (chest x-ray and KUB ordered as stat) and our team reevaluated patient.  Patient hemodynamically stable throughout.  X-rays unremarkable other than newly placed cystic duct stent and metal biliary stent.  Discussed case with medicine service for observation admission for potential post ERCP pancreatitis versus biliary stent discomfort/pain/postprocedural complication.  Patient hemodynamically stable with pain noted to be 8 out of 10 which was her baseline at this point in the afternoon.  Patient will be admitted to the hospitalist service with GI inpatient team for Real following.  Patient's husband updated.  Patient was initiated on lactated ringer 150 cc/h to be monitored over the course of the next few hours.  If patient becomes tachycardic she will need further fluid bolusing.  Labs ordered by medicine service.  Lipase to be ordered as well.  If issues arise over the course of the evening please feel free to reach out to the inpatient GI service on call otherwise we will see the patient tomorrow.  Aggressive treatment with pain control but if pain is not able to be managed, then patient should undergo a CT abdomen/pelvis with IV and oral contrast and after that has been obtained please reach out to the on-call GI service to update if there are any signs on imaging that would suggest further post procedural complications.  Otherwise continue treatment for post ERCP pancreatitis.  Appreciate the medical service taking on the patient for observation.  Patient's husband  updated throughout the course of the day.  Justice Britain, MD [facility] Office # [phone]

## 2020-09-30 NOTE — Transfer of Care (Signed)
Immediate Anesthesia Transfer of Care Note  Patient: Kim Hicks  Procedure(s) Performed: ENDOSCOPIC RETROGRADE CHOLANGIOPANCREATOGRAPHY (ERCP) WITH PROPOFOL (N/A ) REMOVAL OF STONES STENT REMOVAL BILIARY STENT PLACEMENT (N/A )  Patient Location: Endoscopy Unit  Anesthesia Type:General  Level of Consciousness: awake  Airway & Oxygen Therapy: Patient Spontanous Breathing and Patient connected to face mask oxygen  Post-op Assessment: Report given to RN and Post -op Vital signs reviewed and stable  Post vital signs: Reviewed and stable  Last Vitals:  Vitals Value Taken Time  BP    Temp    Pulse 95 09/30/20 1307  Resp    SpO2 99 % 09/30/20 1307  Vitals shown include unvalidated device data.  Last Pain:  Vitals:   09/30/20 1012  TempSrc: Oral  PainSc: 0-No pain         Complications: No complications documented.

## 2020-10-01 ENCOUNTER — Encounter (HOSPITAL_COMMUNITY): Payer: Self-pay | Admitting: Gastroenterology

## 2020-10-01 ENCOUNTER — Inpatient Hospital Stay (HOSPITAL_COMMUNITY): Payer: Medicare Other

## 2020-10-01 ENCOUNTER — Other Ambulatory Visit: Payer: Self-pay | Admitting: Nurse Practitioner

## 2020-10-01 DIAGNOSIS — K219 Gastro-esophageal reflux disease without esophagitis: Secondary | ICD-10-CM | POA: Diagnosis present

## 2020-10-01 DIAGNOSIS — L299 Pruritus, unspecified: Secondary | ICD-10-CM | POA: Diagnosis present

## 2020-10-01 DIAGNOSIS — R1013 Epigastric pain: Secondary | ICD-10-CM | POA: Diagnosis not present

## 2020-10-01 DIAGNOSIS — K839 Disease of biliary tract, unspecified: Secondary | ICD-10-CM

## 2020-10-01 DIAGNOSIS — D509 Iron deficiency anemia, unspecified: Secondary | ICD-10-CM | POA: Diagnosis present

## 2020-10-01 DIAGNOSIS — Z20822 Contact with and (suspected) exposure to covid-19: Secondary | ICD-10-CM | POA: Diagnosis present

## 2020-10-01 DIAGNOSIS — R110 Nausea: Secondary | ICD-10-CM

## 2020-10-01 DIAGNOSIS — K7469 Other cirrhosis of liver: Secondary | ICD-10-CM | POA: Diagnosis present

## 2020-10-01 DIAGNOSIS — T8189XA Other complications of procedures, not elsewhere classified, initial encounter: Secondary | ICD-10-CM | POA: Diagnosis present

## 2020-10-01 DIAGNOSIS — K9189 Other postprocedural complications and disorders of digestive system: Secondary | ICD-10-CM | POA: Diagnosis not present

## 2020-10-01 DIAGNOSIS — E119 Type 2 diabetes mellitus without complications: Secondary | ICD-10-CM | POA: Diagnosis present

## 2020-10-01 DIAGNOSIS — Z888 Allergy status to other drugs, medicaments and biological substances status: Secondary | ICD-10-CM | POA: Diagnosis not present

## 2020-10-01 DIAGNOSIS — K838 Other specified diseases of biliary tract: Secondary | ICD-10-CM | POA: Diagnosis not present

## 2020-10-01 DIAGNOSIS — F32A Depression, unspecified: Secondary | ICD-10-CM | POA: Diagnosis present

## 2020-10-01 DIAGNOSIS — R1084 Generalized abdominal pain: Secondary | ICD-10-CM | POA: Diagnosis not present

## 2020-10-01 DIAGNOSIS — Z79899 Other long term (current) drug therapy: Secondary | ICD-10-CM | POA: Diagnosis not present

## 2020-10-01 DIAGNOSIS — E785 Hyperlipidemia, unspecified: Secondary | ICD-10-CM | POA: Diagnosis present

## 2020-10-01 DIAGNOSIS — Z9981 Dependence on supplemental oxygen: Secondary | ICD-10-CM | POA: Diagnosis not present

## 2020-10-01 DIAGNOSIS — I10 Essential (primary) hypertension: Secondary | ICD-10-CM | POA: Diagnosis present

## 2020-10-01 DIAGNOSIS — Z794 Long term (current) use of insulin: Secondary | ICD-10-CM | POA: Diagnosis not present

## 2020-10-01 DIAGNOSIS — Y848 Other medical procedures as the cause of abnormal reaction of the patient, or of later complication, without mention of misadventure at the time of the procedure: Secondary | ICD-10-CM | POA: Diagnosis present

## 2020-10-01 DIAGNOSIS — D61818 Other pancytopenia: Secondary | ICD-10-CM | POA: Diagnosis present

## 2020-10-01 DIAGNOSIS — F419 Anxiety disorder, unspecified: Secondary | ICD-10-CM | POA: Diagnosis present

## 2020-10-01 DIAGNOSIS — G8929 Other chronic pain: Secondary | ICD-10-CM | POA: Diagnosis present

## 2020-10-01 DIAGNOSIS — Z818 Family history of other mental and behavioral disorders: Secondary | ICD-10-CM | POA: Diagnosis not present

## 2020-10-01 DIAGNOSIS — Z7984 Long term (current) use of oral hypoglycemic drugs: Secondary | ICD-10-CM | POA: Diagnosis not present

## 2020-10-01 DIAGNOSIS — Z9889 Other specified postprocedural states: Secondary | ICD-10-CM | POA: Diagnosis not present

## 2020-10-01 DIAGNOSIS — Z8249 Family history of ischemic heart disease and other diseases of the circulatory system: Secondary | ICD-10-CM | POA: Diagnosis not present

## 2020-10-01 DIAGNOSIS — Z833 Family history of diabetes mellitus: Secondary | ICD-10-CM | POA: Diagnosis not present

## 2020-10-01 DIAGNOSIS — G629 Polyneuropathy, unspecified: Secondary | ICD-10-CM | POA: Diagnosis present

## 2020-10-01 DIAGNOSIS — J9610 Chronic respiratory failure, unspecified whether with hypoxia or hypercapnia: Secondary | ICD-10-CM | POA: Diagnosis present

## 2020-10-01 DIAGNOSIS — K859 Acute pancreatitis without necrosis or infection, unspecified: Secondary | ICD-10-CM | POA: Diagnosis present

## 2020-10-01 LAB — COMPREHENSIVE METABOLIC PANEL
ALT: 36 U/L (ref 0–44)
AST: 51 U/L — ABNORMAL HIGH (ref 15–41)
Albumin: 3.7 g/dL (ref 3.5–5.0)
Alkaline Phosphatase: 168 U/L — ABNORMAL HIGH (ref 38–126)
Anion gap: 7 (ref 5–15)
BUN: 29 mg/dL — ABNORMAL HIGH (ref 6–20)
CO2: 28 mmol/L (ref 22–32)
Calcium: 9.2 mg/dL (ref 8.9–10.3)
Chloride: 103 mmol/L (ref 98–111)
Creatinine, Ser: 0.51 mg/dL (ref 0.44–1.00)
GFR, Estimated: >60 mL/min (ref >60)
Glucose, Bld: 100 mg/dL — ABNORMAL HIGH (ref 70–99)
Potassium: 4.8 mmol/L (ref 3.5–5.1)
Sodium: 138 mmol/L (ref 135–145)
Total Bilirubin: 0.2 mg/dL — ABNORMAL LOW (ref 0.3–1.2)
Total Protein: 7.6 g/dL (ref 6.5–8.1)

## 2020-10-01 LAB — C-REACTIVE PROTEIN: CRP: 1.3 mg/dL — ABNORMAL HIGH (ref <1.0)

## 2020-10-01 LAB — GLUCOSE, CAPILLARY
Glucose-Capillary: 100 mg/dL — ABNORMAL HIGH (ref 70–99)
Glucose-Capillary: 105 mg/dL — ABNORMAL HIGH (ref 70–99)
Glucose-Capillary: 114 mg/dL — ABNORMAL HIGH (ref 70–99)
Glucose-Capillary: 122 mg/dL — ABNORMAL HIGH (ref 70–99)
Glucose-Capillary: 83 mg/dL (ref 70–99)
Glucose-Capillary: 83 mg/dL (ref 70–99)

## 2020-10-01 LAB — CBC
HCT: 36.3 % (ref 36.0–46.0)
Hemoglobin: 10.6 g/dL — ABNORMAL LOW (ref 12.0–15.0)
MCH: 24.9 pg — ABNORMAL LOW (ref 26.0–34.0)
MCHC: 29.2 g/dL — ABNORMAL LOW (ref 30.0–36.0)
MCV: 85.4 fL (ref 80.0–100.0)
Platelets: 180 10*3/uL (ref 150–400)
RBC: 4.25 MIL/uL (ref 3.87–5.11)
RDW: 19 % — ABNORMAL HIGH (ref 11.5–15.5)
WBC: 5.6 10*3/uL (ref 4.0–10.5)
nRBC: 0 % (ref 0.0–0.2)

## 2020-10-01 LAB — LIPASE, BLOOD: Lipase: 42 U/L (ref 11–51)

## 2020-10-01 MED ORDER — IOHEXOL 9 MG/ML PO SOLN
ORAL | Status: AC
  Administered 2020-10-01: 500 mL
  Filled 2020-10-01: qty 1000

## 2020-10-01 MED ORDER — BREXPIPRAZOLE 2 MG PO TABS
2.0000 mg | ORAL_TABLET | Freq: Every day | ORAL | Status: DC
  Administered 2020-10-01 – 2020-10-02 (×2): 2 mg via ORAL
  Filled 2020-10-01 (×2): qty 1

## 2020-10-01 MED ORDER — PREGABALIN 100 MG PO CAPS
100.0000 mg | ORAL_CAPSULE | Freq: Two times a day (BID) | ORAL | Status: DC
  Administered 2020-10-02 – 2020-10-03 (×4): 100 mg via ORAL
  Filled 2020-10-01 (×4): qty 1

## 2020-10-01 MED ORDER — ACETAMINOPHEN 325 MG PO TABS
650.0000 mg | ORAL_TABLET | Freq: Four times a day (QID) | ORAL | Status: DC | PRN
  Administered 2020-10-01 – 2020-10-02 (×4): 650 mg via ORAL
  Filled 2020-10-01 (×5): qty 2

## 2020-10-01 MED ORDER — IOHEXOL 9 MG/ML PO SOLN: 500.0000 mL | ORAL | Status: AC

## 2020-10-01 MED ORDER — VORTIOXETINE HBR 5 MG PO TABS
20.0000 mg | ORAL_TABLET | Freq: Every day | ORAL | Status: DC
  Administered 2020-10-01 – 2020-10-02 (×2): 20 mg via ORAL
  Filled 2020-10-01 (×2): qty 4

## 2020-10-01 MED ORDER — DIPHENHYDRAMINE HCL 25 MG PO CAPS
25.0000 mg | ORAL_CAPSULE | Freq: Four times a day (QID) | ORAL | Status: DC | PRN
  Administered 2020-10-01 (×2): 25 mg via ORAL
  Filled 2020-10-01 (×2): qty 1

## 2020-10-01 MED ORDER — ZIPRASIDONE HCL 20 MG PO CAPS
20.0000 mg | ORAL_CAPSULE | Freq: Every day | ORAL | Status: DC
  Administered 2020-10-01 – 2020-10-02 (×2): 20 mg via ORAL
  Filled 2020-10-01 (×3): qty 1

## 2020-10-01 MED ORDER — QUETIAPINE FUMARATE 50 MG PO TABS
200.0000 mg | ORAL_TABLET | Freq: Every day | ORAL | Status: DC
  Administered 2020-10-01 – 2020-10-02 (×2): 200 mg via ORAL
  Filled 2020-10-01 (×2): qty 4

## 2020-10-01 MED ORDER — PREGABALIN 100 MG PO CAPS
200.0000 mg | ORAL_CAPSULE | Freq: Every day | ORAL | Status: DC
  Administered 2020-10-01 – 2020-10-02 (×2): 200 mg via ORAL
  Filled 2020-10-01 (×2): qty 2

## 2020-10-01 NOTE — Progress Notes (Addendum)
PROGRESS NOTE    Kim Hicks  OZD:[id number] DOB: [birth year] DOA: 09/30/2020 PCP: [name], FNP   No chief complaint on file.   Brief Narrative:  55 y.o. female with medical history significant for anxiety, depression, liver cirrhosis, hypertension, chronic respiratory failure on 2 L of oxygen at bedtime, hypertension who is status post ERCP for persistent bile leak following Numa Schroeter cholecystectomy that was done on 08/12/19.  She had stents placed 2 months ago but continued to have bile leak and so had an ERCP done today with removal and exchange of biliary stent as well as sphincterotomy.  She developed post procedure pain and was admitted to the hospitalist service for further management.   Assessment & Plan:   Principal Problem:   Abdominal pain Active Problems:   Diabetes mellitus without complication (HCC)   Hypertension   Bile leak  Abdominal pain Post ERCP for bile leak following cholecystectomy S/p ERCP with stent exchange and sphincterotomy 5/9 Abdominal pain afterwards Lipase wnl Concern for possible post ERCP pancreatitis versus complication from procedure Pain control and antiemetics Full liquid diet per GI CT abdomen/pelvis  GI consult - appreciate recommendations   Pruritus Normal bili Allergy to opiates? Benadryl Follow and w/u as indicated  Diabetes mellitus Hold oral hypoglycemic agents On 40 units basal at home - holding at this time Sliding scale coverage with insulin Accu-Cheks every 4 hours  Depression She's on many psych meds for depression/anxiety Will confirm she's taking these -> she notes she's taking - will resume, but need to review with her prescribing psychiatrist for polypharmacy?  Hypertension Hold lisinopril for now  Chronic Pain Resuming home lyrica as well   DVT prophylaxis: SCD Code Status: full  Family Communication: husband at bedside Disposition:   Status is: Observation  The patient will require care spanning > 2  midnights and should be moved to inpatient because: Inpatient level of care appropriate due to severity of illness  Dispo: The patient is from: Home              Anticipated d/c is to: Home              Patient currently is not medically stable to d/c.   Difficult to place patient No       Consultants:   GI  Procedures:  ERCP Impression - Erythematous mucosa in the stomach - previously biopsied. - No gross lesions in the duodenal bulb, in the first portion of the duodenum and in the second portion of the duodenum. - Prior biliary sphincterotomy appeared open. Four partially occluded stents from the biliary tree were seen in the major papilla - partially migrated downwards though still within the CBD. The stents were removed. - The fluoroscopic examination was suspicious for sludge of the biliary tree. - The lower third of the main bile duct was mildly dilated. - Wire placed into the cystic duct as well. - Kim Hicks bile leak was found from Kim Hicks low-lying cystic duct. - Choledocholithiasis was found within the cystic duct and complete removal was accomplished by sweeping. Biliary sludge and debris within the main CBD was removed by sweeping. - One plastic biliary stent was placed into the cystic duct. - One fully covered metal biliary stent was placed into the common bile duct. - Completion cholangiogram did not show persistent leakage. Recommendation The patient will be observed post-procedure, until all discharge criteria are met. - Discharge patient to home. - Patient has Kim Hicks contact number available for emergencies. The signs and  symptoms of potential delayed complications were discussed with the patient. Return to normal activities tomorrow. Written discharge instructions were provided to the patient. - Low fat diet. - Observe patient's clinical course. - Check liver enzymes (AST, ALT, alkaline phosphatase, bilirubin) in 1 week. - Repeat ERCP in 3 months to exchange stent. - Watch  for pancreatitis, bleeding, perforation, and cholangitis. - Short course of Oral Opioids to be given for patient, though she and family understand that we will not be continuing this post-procedurally, and any chronic pain medications will need to be prescribed by PCP or by Pain team. - If patient fails this attempt at bile leak closure, then would ask IR if Cystic duct embolization via coils/glue could be possible vs patient being referred to surgery for consideration of Bile Duct reconstruction. If patient/family want another opinion from [facility] elsewhere, we can help with facilitating this (but with prior plastic, FCSEMS, 4 plastic stents, and now what we have done, I am not sure what else would be offered/trialed). - The findings and recommendations were discussed with the patient. - The findings and recommendations were discussed with the patient's family.  Antimicrobials:  Anti-infectives (From admission, onward)   None        Subjective: C/o itching and abdominal pain   Objective: Vitals:   10/01/20 0156 10/01/20 0538 10/01/20 0906 10/01/20 1306  BP: (!) 141/76 133/76 132/80 135/78  Pulse: 87 87 89 85  Resp: _0 Temp: 97.7 F (36.5 C) 98.9 F (37.2 C) 98.4 F (36.9 C) 98.2 F (36.8 C)  TempSrc: Oral Oral Oral Oral  SpO2: 100% 100% 93% 95%  Weight:      Height:        Intake/Output Summary (Last 24 hours) at 10/01/2020 1544 Last data filed at 10/01/2020 1348 Gross per 24 hour  Intake 2742.42 ml  Output 0 ml  Net 2742.42 ml   Filed Weights   09/25/20 1454 09/30/20 1012  Weight: 77.1 kg 77.1 kg    Examination:  General exam: Appears calm and comfortable  Respiratory system: Clear to auscultation. Respiratory effort normal. Cardiovascular system: S1 & S2 heard, RRR.  Gastrointestinal system: Abdomen is nondistended, soft and nontender. Central nervous system: Alert and oriented. No focal neurological deficits. Extremities: no LEE Skin:  No rashes, lesions or ulcers Psychiatry: Judgement and insight appear normal. Mood & affect appropriate.     Data Reviewed: I have personally reviewed following labs and imaging studies  CBC: Recent Labs  Lab 09/30/20 1622 10/01/20 0443  WBC 6.6 5.6  NEUTROABS 6.0  --   HGB 10.8* 10.6*  HCT 37.2 36.3  MCV 86.3 85.4  PLT 171 397    Basic Metabolic Panel: Recent Labs  Lab 09/30/20 1622 10/01/20 0443  NA 134* 138  K 6.0* 4.8  CL 102 103  CO2 23 28  GLUCOSE 286* 100*  BUN 35* 29*  CREATININE 0.79 0.51  CALCIUM 8.8* 9.2    GFR: Estimated Creatinine Clearance: 81.5 mL/min (by C-G formula based on SCr of 0.51 mg/dL).  Liver Function Tests: Recent Labs  Lab 09/30/20 1632 10/01/20 0443  AST 60* 51*  ALT 42 36  ALKPHOS 180* 168*  BILITOT 0.7 0.2*  PROT 7.7 7.6  ALBUMIN 3.8 3.7    CBG: Recent Labs  Lab 09/30/20 2317 10/01/20 0353 10/01/20 0737 10/01/20 1144 10/01/20 1530  GLUCAP 123* 83 83 100* 122*     Recent Results (from the past 240 hour(s))  SARS CORONAVIRUS 2 (  TAT 6-24 HRS) Nasopharyngeal Nasopharyngeal Swab     Status: None   Collection Time: 09/26/20  9:53 AM   Specimen: Nasopharyngeal Swab  Result Value Ref Range Status   SARS Coronavirus 2 NEGATIVE NEGATIVE Final    Comment: (NOTE) SARS-CoV-2 target nucleic acids are NOT DETECTED.  The SARS-CoV-2 RNA is generally detectable in upper and lower respiratory specimens during the acute phase of infection. Negative results do not preclude SARS-CoV-2 infection, do not rule out co-infections with other pathogens, and should not be used as the sole basis for treatment or other patient management decisions. Negative results must be combined with clinical observations, patient history, and epidemiological information. The expected result is Negative.  Fact Sheet for Patients: SugarRoll.be  Fact Sheet for Healthcare  Providers: https://www.woods-mathews.com/  This test is not yet approved or cleared by the [place] FDA and  has been authorized for detection and/or diagnosis of SARS-CoV-2 by FDA under an Emergency Use Authorization (EUA). This EUA will remain  in effect (meaning this test can be used) for the duration of the COVID-19 declaration under Se ction 564(b)(1) of the Act, 21 U.S.C. section 360bbb-3(b)(1), unless the authorization is terminated or revoked sooner.  Performed at [facility], [place] [address] [place], [place] [address]          Radiology Studies: DG Ribs Unilateral Left  Result Date: 09/30/2020 CLINICAL DATA:  [place] onto some steps 1 week ago. Left anterior/inferior chest wall/rib pain. EXAM: LEFT RIBS - 2 VIEW COMPARISON:  Current chest radiograph. Prior exams. Prior chest CT dated 11/07/2019. FINDINGS: No fracture or other bone lesions are seen involving the ribs. IMPRESSION: Negative. Electronically Signed   By: Lajean Manes M.D.   On: 09/30/2020 16:38   DG Chest Port 1 View  Result Date: 09/30/2020 CLINICAL DATA:  Recent ERCP with pain, initial encounter EXAM: PORTABLE CHEST 1 VIEW COMPARISON:  07/08/2020 FINDINGS: Cardiac shadow is at the upper limits of normal in size. Mild bibasilar atelectasis is noted. The lungs are otherwise clear. No bony abnormality is seen. IMPRESSION: Mild bibasilar atelectasis. Electronically Signed   By: Inez Catalina M.D.   On: 09/30/2020 15:44   DG ERCP  Result Date: 09/30/2020 CLINICAL DATA:  55 year old female with history of cholecystectomy and postoperative bile leak EXAM: ERCP TECHNIQUE: Multiple spot images obtained with the fluoroscopic device and submitted for interpretation post-procedure. FLUOROSCOPY TIME:  Fluoroscopy Time:  8 minutes 15 seconds COMPARISON:  08/28/2020 FINDINGS: Limited intraoperative fluoroscopic spot images during ERCP. Initial image demonstrates the endoscope projecting over the upper abdomen  with plastic biliary stents in place. Subsequently there is removal of Kim Hicks plastic biliary stents and then placement of Kim Hicks safety wire cross the ampulla. Retrograde infusion of contrast. There is deployment of Kim Hicks retrieval balloon. Images also demonstrate contrast accumulating in extra lumen location adjacent tube pigtail drainage catheter. Final image demonstrates deployment of parallel metallic biliary stent and plastic biliary stent. IMPRESSION: Limited intraoperative fluoroscopic spot images during ERCP demonstrates treatment of ongoing biliary leak with exchange of plastic biliary stents for new parallel plastic and metallic biliary stents and deployment of retrieval balloon. Please refer to the dictated operative report for full details of intraoperative findings and procedure. Electronically Signed   By: Corrie Mckusick D.O.   On: 09/30/2020 13:24   DG Abd Portable 2V  Result Date: 09/30/2020 CLINICAL DATA:  Abdominal pain following ERCP EXAM: PORTABLE ABDOMEN - 2 VIEW COMPARISON:  ERCP filled films from earlier in the same day,  prior plain film from 09/17/2020 FINDINGS: Scattered large and small bowel gas is noted. No free air is seen. Two of the previously seen 3 common bile duct stents have been removed in the interval. Kim Hicks new metallic wall stent is noted adjacent to the residual plastic stent. Cholecystostomy tube is noted in the right upper quadrant. No other focal abnormality is noted. IMPRESSION: Interval placement of metallic wall stent adjacent to Kim Hicks residual plastic stent in the common bile duct. Cholecystostomy tube remains in place. No acute intra-abdominal abnormality is noted. Electronically Signed   By: Inez Catalina M.D.   On: 09/30/2020 15:43        Scheduled Meds: . insulin aspart  0-15 Units Subcutaneous Q4H  . pantoprazole (PROTONIX) IV  40 mg Intravenous Q24H   Continuous Infusions: . sodium chloride 125 mL/hr at 10/01/20 0900  . lactated ringers 1,000 mL (09/30/20 1052)      LOS: 0 days    Time spent: over 30 min    Fayrene Helper, MD [facility]   To contact the attending provider between 7A-7P or the covering provider during after hours 7P-7A, please log into the web site [email or web address] and access using universal [place] password for that web site. If you do not have the password, please call the hospital operator.  10/01/2020, 3:44 PM

## 2020-10-01 NOTE — Progress Notes (Addendum)
[facility] Progress Note  CC:  Post ERCP abdominal pain   Subjective: She continues to have upper abdominal, RUQ > LUQ and RLQ pain which decreases after she receives Dilaudid Q 4 hours. Some nausea. No vomiting. She is tolerating a clear liquid diet. She passed a small brown formed BM this morning. No rectal bleeding or melena. No CP or SOB.    Objective:   ERCP 09/30/2020: - Erythematous mucosa in the stomach - previously biopsied. - No gross lesions in the duodenal bulb, in the first portion of the duodenum and in the second portion of the duodenum. - Prior biliary sphincterotomy appeared open. Four partially occluded stents from the biliary tree were seen in the major papilla - partially migrated downwards though still within the CBD. The stents were removed. - The fluoroscopic examination was suspicious for sludge of the biliary tree. - The lower third of the main bile duct was mildly dilated. - Wire placed into the cystic duct as well. - A bile leak was found from a low-lying cystic duct. - Choledocholithiasis was found within the cystic duct and complete removal was accomplished by sweeping. Biliary sludge and debris within the main CBD was removed by sweeping. - One plastic biliary stent was placed into the cystic duct. - One fully covered metal biliary stent was placed into the common bile duct. - Completion cholangiogram did not show persistent leakage.   Vital signs in last 24 hours: Temp:  [97.7 F (36.5 C)-98.9 F (37.2 C)] 98.9 F (37.2 C) (05/10 0538) Pulse Rate:  [81-96] 87 (05/10 0538) Resp:  [10-23] 16 (05/10 0538) BP: (133-187)/(54-162) 133/76 (05/10 0538) SpO2:  [88 %-100 %] 100 % (05/10 0538) Weight:  [77.1 kg] 77.1 kg (05/09 1012) Last BM Date: 09/29/20 General:   Alert 55 year old female in NAD.  Heart: RRR, no murmur.  Pulm:  Breath sounds clear throughout.  Abdomen: Mild gaseous distension. Moderate tenderness throughout the  upper abdomen, less tenderness to the RLQ. No rebound or guarding. Biliary drain site intact, scant amount of bilious drainage in collection bag.  Extremities:  Without edema. Neurologic:  Alert and  oriented x4. Grossly normal neurologically. Psych:  Alert and cooperative. Normal mood and affect.  Intake/Output from previous day: 05/09 0701 - 05/10 0700 In: 2755.8 [P.O.:240; I.V.:2315.8; IV Piggyback:200] Out: 0  Intake/Output this shift: No intake/output data recorded.  Lab Results: Recent Labs    09/30/20 1622 10/01/20 0443  WBC 6.6 5.6  HGB 10.8* 10.6*  HCT 37.2 36.3  PLT 171 180   BMET Recent Labs    09/30/20 1622 10/01/20 0443  NA 134* 138  K 6.0* 4.8  CL 102 103  CO2 23 28  GLUCOSE 286* 100*  BUN 35* 29*  CREATININE 0.79 0.51  CALCIUM 8.8* 9.2   LFT Recent Labs    09/30/20 1632 10/01/20 0443  PROT 7.7 7.6  ALBUMIN 3.8 3.7  AST 60* 51*  ALT 42 36  ALKPHOS 180* 168*  BILITOT 0.7 0.2*  BILIDIR 0.1  --   IBILI 0.6  --    PT/INR No results for input(s): LABPROT, INR in the last 72 hours. Hepatitis Panel No results for input(s): HEPBSAG, HCVAB, HEPAIGM, HEPBIGM in the last 72 hours.  DG Ribs Unilateral Left  Result Date: 09/30/2020 CLINICAL DATA:  [place] onto some steps 1 week ago. Left anterior/inferior chest wall/rib pain. EXAM: LEFT RIBS - 2 VIEW COMPARISON:  Current chest radiograph. Prior exams. Prior chest CT  dated 11/07/2019. FINDINGS: No fracture or other bone lesions are seen involving the ribs. IMPRESSION: Negative. Electronically Signed   By: Lajean Manes M.D.   On: 09/30/2020 16:38   DG Chest Port 1 View  Result Date: 09/30/2020 CLINICAL DATA:  Recent ERCP with pain, initial encounter EXAM: PORTABLE CHEST 1 VIEW COMPARISON:  07/08/2020 FINDINGS: Cardiac shadow is at the upper limits of normal in size. Mild bibasilar atelectasis is noted. The lungs are otherwise clear. No bony abnormality is seen. IMPRESSION: Mild bibasilar atelectasis.  Electronically Signed   By: Inez Catalina M.D.   On: 09/30/2020 15:44   DG ERCP  Result Date: 09/30/2020 CLINICAL DATA:  55 year old female with history of cholecystectomy and postoperative bile leak EXAM: ERCP TECHNIQUE: Multiple spot images obtained with the fluoroscopic device and submitted for interpretation post-procedure. FLUOROSCOPY TIME:  Fluoroscopy Time:  8 minutes 15 seconds COMPARISON:  08/28/2020 FINDINGS: Limited intraoperative fluoroscopic spot images during ERCP. Initial image demonstrates the endoscope projecting over the upper abdomen with plastic biliary stents in place. Subsequently there is removal of a plastic biliary stents and then placement of a safety wire cross the ampulla. Retrograde infusion of contrast. There is deployment of a retrieval balloon. Images also demonstrate contrast accumulating in extra lumen location adjacent tube pigtail drainage catheter. Final image demonstrates deployment of parallel metallic biliary stent and plastic biliary stent. IMPRESSION: Limited intraoperative fluoroscopic spot images during ERCP demonstrates treatment of ongoing biliary leak with exchange of plastic biliary stents for new parallel plastic and metallic biliary stents and deployment of retrieval balloon. Please refer to the dictated operative report for full details of intraoperative findings and procedure. Electronically Signed   By: Corrie Mckusick D.O.   On: 09/30/2020 13:24   DG Abd Portable 2V  Result Date: 09/30/2020 CLINICAL DATA:  Abdominal pain following ERCP EXAM: PORTABLE ABDOMEN - 2 VIEW COMPARISON:  ERCP filled films from earlier in the same day, prior plain film from 09/17/2020 FINDINGS: Scattered large and small bowel gas is noted. No free air is seen. Two of the previously seen 3 common bile duct stents have been removed in the interval. A new metallic wall stent is noted adjacent to the residual plastic stent. Cholecystostomy tube is noted in the right upper quadrant. No  other focal abnormality is noted. IMPRESSION: Interval placement of metallic wall stent adjacent to a residual plastic stent in the common bile duct. Cholecystostomy tube remains in place. No acute intra-abdominal abnormality is noted. Electronically Signed   By: Inez Catalina M.D.   On: 09/30/2020 15:43    Assessment / Plan:  34. 55 year old female s/p laparoscopic cholecystectomy in March 6384 complicated by a post op bile leak which required numerous ERCPs and biliary stent placement and exchanges. She underwent an ERCP by Dr. Rush Landmark on 09/30/2020 which showed the prior biliary sphincterotomy appeared open. Four partially occluded stents from the biliary tree were seen in the major papilla (partially migrated downwards though still within the CBD). The stents were removed. A bile leak was identified from the cystic duct and choledocholithiasis was found in the cyst duct and was removed. Biliary sludge and debris in the main CBD was also removed. One plastic biliary stent was placed into the cystic duct and one fully covered metal biliary stent was placed in the CBD. Cholangiogram did not show any further bile leakage. She awakened post procedure with significant LUQ, RUQ and RLQ abdominal pain. Chest xray and KUB without evidence of perforation. She was admitted  overnight to observe for post ERCP pancreatitis. IVFs @ 150cc/hr. Lipase 42. Stable LFTs. AST 51. ALT 36. Alk phos 168. T. Bili 0.2. CRP 1.3. Hemodynamically stable.  -Reduce IV fluids to 100cc/hr -Full liquid diet -Pain management per the hospitalist -Zofran 13m po or IV Q 6 hours -Repeat CBC and hepatic panel in am -CTAP if her abdominal pain worsen, await further recommendations per Dr. NSilverio Decamp -Repeat ERCP in 3 months to exchange stent  2. Normocytic anemia. No overt GI bleeding.   3. Cirrhosis per CTAP 07/04/2020.  -Further cirrhosis follow up/evaluatuion as outpatient   Principal Problem:   Abdominal pain Active Problems:    Diabetes mellitus without complication ([place]   Hypertension   Bile leak     LOS: 0 days   CIVIANA BLASINGAME 10/01/2020, 8:24 AM    Attending physician's note   I have taken an interval history, reviewed the chart and examined the patient. I agree with the Advanced Practitioner's note, impression and recommendations.    Complains of generalized abdominal discomfort, nausea and itching She appears comfortable laying in bed She wants to try full liquid diet, will advance to full liquids  Will consider CT abdomen pelvis for further evaluation if patient is unable to tolerate any liquid diet and continues to have persistent or worsening abdominal pain   The patient was provided an opportunity to ask questions and all were answered. The patient agreed with the plan and demonstrated an understanding of the instructions.  KDamaris Hippo, MD [phone]

## 2020-10-01 NOTE — Progress Notes (Signed)
Inpatient Diabetes Program Recommendations  AACE/ADA: New Consensus Statement on Inpatient Glycemic Control (2015)  Target Ranges:  Prepandial:   less than 140 mg/dL      Peak postprandial:   less than 180 mg/dL (1-2 hours)      Critically ill patients:  140 - 180 mg/dL   Lab Results  Component Value Date   GLUCAP 83 10/01/2020   HGBA1C 9.5 (H) 09/30/2020    Review of Glycemic Control Results for Kim Hicks, Kim Hicks (MRN [id number]) as of 10/01/2020 10:50  Ref. Range 09/30/2020 23:17 10/01/2020 03:53 10/01/2020 07:37  Glucose-Capillary Latest Ref Range: 70 - 99 mg/dL 123 (H) 83 83   Diabetes history: DM2 Outpatient Diabetes medications:  Trulicity 4.5 mg weekly Synjary 25-1000 mg QAM Metfromin 1000 mg QHS Basaglar 40 units QHS Current orders for Inpatient glycemic control:  Novolog 0-15 units Q4H  Inpatient Diabetes Program Recommendations:     Might consider:  Novolog 0-15 units TID and 0-5 QHS  Will continue to follow while inpatient.  Thank you, Reche Dixon, RN, BSN Diabetes Coordinator Inpatient Diabetes Program [phone] (team pager from 8a-5p)

## 2020-10-01 NOTE — Anesthesia Postprocedure Evaluation (Signed)
Anesthesia Post Note  Patient: Kim Hicks  Procedure(s) Performed: ENDOSCOPIC RETROGRADE CHOLANGIOPANCREATOGRAPHY (ERCP) WITH PROPOFOL (N/A ) REMOVAL OF STONES STENT REMOVAL BILIARY STENT PLACEMENT (N/A )     Patient location during evaluation: PACU Anesthesia Type: General Level of consciousness: awake and alert Pain management: pain level controlled Vital Signs Assessment: post-procedure vital signs reviewed and stable Respiratory status: spontaneous breathing, nonlabored ventilation, respiratory function stable and patient connected to nasal cannula oxygen Cardiovascular status: blood pressure returned to baseline and stable Postop Assessment: no apparent nausea or vomiting Anesthetic complications: no   No complications documented.               Effie Berkshire

## 2020-10-01 NOTE — Progress Notes (Signed)
Pt feels like she is having an allergic reaction to the detergent the wash cloth is washed in. Her head and face is red and itchy. MD notified.

## 2020-10-02 DIAGNOSIS — Z9889 Other specified postprocedural states: Secondary | ICD-10-CM | POA: Diagnosis not present

## 2020-10-02 DIAGNOSIS — F32A Depression, unspecified: Secondary | ICD-10-CM

## 2020-10-02 DIAGNOSIS — K839 Disease of biliary tract, unspecified: Secondary | ICD-10-CM | POA: Diagnosis not present

## 2020-10-02 DIAGNOSIS — R1084 Generalized abdominal pain: Secondary | ICD-10-CM | POA: Diagnosis not present

## 2020-10-02 LAB — BASIC METABOLIC PANEL
Anion gap: 7 (ref 5–15)
BUN: 19 mg/dL (ref 6–20)
CO2: 24 mmol/L (ref 22–32)
Calcium: 8.3 mg/dL — ABNORMAL LOW (ref 8.9–10.3)
Chloride: 105 mmol/L (ref 98–111)
Creatinine, Ser: 0.54 mg/dL (ref 0.44–1.00)
GFR, Estimated: >60 mL/min (ref >60)
Glucose, Bld: 114 mg/dL — ABNORMAL HIGH (ref 70–99)
Potassium: 4.4 mmol/L (ref 3.5–5.1)
Sodium: 136 mmol/L (ref 135–145)

## 2020-10-02 LAB — CBC
HCT: 31.9 % — ABNORMAL LOW (ref 36.0–46.0)
Hemoglobin: 9.2 g/dL — ABNORMAL LOW (ref 12.0–15.0)
MCH: 25 pg — ABNORMAL LOW (ref 26.0–34.0)
MCHC: 28.8 g/dL — ABNORMAL LOW (ref 30.0–36.0)
MCV: 86.7 fL (ref 80.0–100.0)
Platelets: 146 10*3/uL — ABNORMAL LOW (ref 150–400)
RBC: 3.68 MIL/uL — ABNORMAL LOW (ref 3.87–5.11)
RDW: 18.9 % — ABNORMAL HIGH (ref 11.5–15.5)
WBC: 4.7 10*3/uL (ref 4.0–10.5)
nRBC: 0 % (ref 0.0–0.2)

## 2020-10-02 LAB — GLUCOSE, CAPILLARY
Glucose-Capillary: 106 mg/dL — ABNORMAL HIGH (ref 70–99)
Glucose-Capillary: 74 mg/dL (ref 70–99)
Glucose-Capillary: 159 mg/dL — ABNORMAL HIGH (ref 70–99)
Glucose-Capillary: 119 mg/dL — ABNORMAL HIGH (ref 70–99)
Glucose-Capillary: 185 mg/dL — ABNORMAL HIGH (ref 70–99)

## 2020-10-02 LAB — HEPATIC FUNCTION PANEL
ALT: 32 U/L (ref 0–44)
AST: 53 U/L — ABNORMAL HIGH (ref 15–41)
Albumin: 3 g/dL — ABNORMAL LOW (ref 3.5–5.0)
Alkaline Phosphatase: 122 U/L (ref 38–126)
Bilirubin, Direct: <0.1 mg/dL (ref 0.0–0.2)
Total Bilirubin: 0.7 mg/dL (ref 0.3–1.2)
Total Protein: 6.2 g/dL — ABNORMAL LOW (ref 6.5–8.1)

## 2020-10-02 LAB — LIPASE, BLOOD: Lipase: 106 U/L — ABNORMAL HIGH (ref 11–51)

## 2020-10-02 MED ORDER — TRAMADOL HCL 50 MG PO TABS
50.0000 mg | ORAL_TABLET | Freq: Two times a day (BID) | ORAL | Status: DC
Start: 2020-10-02 — End: 2020-10-03
  Administered 2020-10-02 – 2020-10-03 (×2): 50 mg via ORAL
  Filled 2020-10-02 (×2): qty 1

## 2020-10-02 MED ORDER — SODIUM CHLORIDE 0.9% FLUSH: 10.0000 mL | INTRAVENOUS | Status: DC | PRN

## 2020-10-02 MED ORDER — PANTOPRAZOLE SODIUM 40 MG PO TBEC: 40.0000 mg | DELAYED_RELEASE_TABLET | Freq: Every day | ORAL | Status: DC

## 2020-10-02 MED ORDER — LINACLOTIDE 145 MCG PO CAPS
290.0000 ug | ORAL_CAPSULE | Freq: Every day | ORAL | Status: DC
  Administered 2020-10-03: 290 ug via ORAL
  Filled 2020-10-02: qty 2

## 2020-10-02 MED ORDER — PANTOPRAZOLE SODIUM 40 MG PO TBEC
40.0000 mg | DELAYED_RELEASE_TABLET | Freq: Every day | ORAL | Status: DC
  Administered 2020-10-02 – 2020-10-03 (×2): 40 mg via ORAL
  Filled 2020-10-02 (×2): qty 1

## 2020-10-02 MED ORDER — OXYCODONE HCL 5 MG PO TABS
5.0000 mg | ORAL_TABLET | ORAL | Status: DC | PRN
Start: 2020-10-02 — End: 2020-10-03
  Administered 2020-10-02 – 2020-10-03 (×2): 5 mg via ORAL
  Filled 2020-10-02 (×2): qty 1

## 2020-10-02 MED ORDER — ALPRAZOLAM 0.5 MG PO TABS: 0.5000 mg | ORAL_TABLET | Freq: Two times a day (BID) | ORAL | Status: DC | PRN

## 2020-10-02 MED ORDER — LACTULOSE 10 GM/15ML PO SOLN: 10.0000 g | Freq: Three times a day (TID) | ORAL | Status: DC | PRN

## 2020-10-02 MED ORDER — MORPHINE SULFATE (PF) 2 MG/ML IV SOLN
1.0000 mg | INTRAVENOUS | Status: DC | PRN
  Administered 2020-10-02 (×2): 2 mg via INTRAVENOUS
  Filled 2020-10-02 (×2): qty 1

## 2020-10-02 MED ORDER — CHLORHEXIDINE GLUCONATE CLOTH 2 % EX PADS
6.0000 | MEDICATED_PAD | Freq: Every day | CUTANEOUS | Status: DC
  Administered 2020-10-03: 6 via TOPICAL

## 2020-10-02 NOTE — Progress Notes (Signed)
The patient is receiving Protonix by the intravenous route.  Based on criteria approved by the Pharmacy and [place], the medication is being converted to the equivalent oral dose form.  These criteria include: -No active GI bleeding -Able to tolerate diet of full liquids (or better) or tube feeding -Able to tolerate other medications by the oral or enteral route  If you have any questions about this conversion, please contact the Pharmacy Department (phone [phone]).  Thank you.  Minda Ditto PharmD 10/02/2020, 8:48 AM

## 2020-10-02 NOTE — Progress Notes (Addendum)
Progress Note  Chief Complaint:    Post ERCP abdominal pain      Attending physician's note   I have taken an interval history, reviewed the chart and examined the patient. I agree with the Advanced Practitioner's note, impression and recommendations.   Tolerated diet CT abd & pelvis with no acute changes Mild lipase elevation s/p ERCP Overall feeling better and feels she is ready to go home tomorrow AM Follow up with IR and Dr Rush Landmark after discharge  The patient was provided an opportunity to ask questions and all were answered. The patient agreed with the plan and demonstrated an understanding of the instructions.  Damaris Hippo , MD [phone]    ASSESSMENT / PLAN:    # 55 yo female with complex biliary history. She had a post-cholecystectomy bile leak in 2021 and has since required several ERCP with stent exchanges for migration / occlusion. Most recent ERCP on 5/9 with removal of 4 partially occluded stents from the biliary tree partially migrated downwards though still within the CBD. A bile leak was found from a low-lying cystic duct. Choledocholithiasis was found within the cystic duct and CBD complete removal was accomplished by sweeping. A plastic biliary stent was placed into the cystic duct and a fully covered metal biliary stent was placed into the CBD.  Completion cholangiogram did not show persistent leakage. She has a chronic cholecystostomy tube  --ALk phos has normalized, bilirubin normal.  # Post ERCP pain.  --Lipase minimally elevated at 106 but otherwise labs at baseline .  She still rates her RUQ pain at 9 out 10 today but cannot take Dilaudid due to itching. RN has contacted [facility] about alternative.  --CT scan yesterday demonstrates minimal edema around HOP and hepatoduodenal ligament, cirrhosis. No biliary duct dilation. Percutaneous pigtail catheter in GB fossa. Etiology of ongoing pain not clear.  --She is tolerating full liquids.  .   #  Cirrhosis by imaging . No evidence for decompensation       SUBJECTIVE:   Still having constant right upper abdominal pain . She is awaiting pain medication change, Dilaudid causes itching. Had two BMs today.     OBJECTIVE:    Scheduled inpatient medications:  . brexpiprazole  2 mg Oral QHS  . Chlorhexidine Gluconate Cloth  6 each Topical Daily  . insulin aspart  0-15 Units Subcutaneous Q4H  . pantoprazole  40 mg Oral Daily  . pregabalin  100 mg Oral BID WC  . pregabalin  200 mg Oral QHS  . QUEtiapine  200 mg Oral QHS  . vortioxetine HBr  20 mg Oral QHS  . ziprasidone  20 mg Oral QHS   Continuous inpatient infusions:  . sodium chloride 125 mL/hr at 10/02/20 0540   PRN inpatient medications: acetaminophen, diphenhydrAMINE, HYDROmorphone (DILAUDID) injection, ondansetron **OR** ondansetron (ZOFRAN) IV, sodium chloride flush  Vital signs in last 24 hours: Temp:  [98.2 F (36.8 C)-98.5 F (36.9 C)] 98.5 F (36.9 C) (05/11 0524) Pulse Rate:  [85-89] 88 (05/11 0524) Resp:  [15-18] 15 (05/11 0524) BP: (109-135)/(63-78) 109/63 (05/11 0524) SpO2:  [92 %-98 %] 98 % (05/11 0524) Last BM Date: 10/01/20  Intake/Output Summary (Last 24 hours) at 10/02/2020 1039 Last data filed at 10/02/2020 1000 Gross per 24 hour  Intake 1285.39 ml  Output 0 ml  Net 1285.39 ml     Physical Exam:  . General: Alert female in NAD . Heart:  Regular rate and rhythm. No lower extremity edema .  Pulmonary: Normal respiratory effort . Abdomen: Soft, nondistended, moderate RUQ tenderness. Normal bowel sounds. Biliary drain bag with ~ 75 ml cloudy bilious fluid . Neurologic: Alert and oriented . Psych: Pleasant. Cooperative.   Filed Weights   09/25/20 1454 09/30/20 1012  Weight: 77.1 kg 77.1 kg    Intake/Output from previous day: 05/10 0701 - 05/11 0700 In: 1725.3 [P.O.:240; I.V.:1475.3] Out: 0  Intake/Output this shift: Total I/O In: 120 [P.O.:120] Out: 0     Lab Results: Recent Labs     09/30/20 1622 10/01/20 0443 10/02/20 0300  WBC 6.6 5.6 4.7  HGB 10.8* 10.6* 9.2*  HCT 37.2 36.3 31.9*  PLT 171 180 146*   BMET Recent Labs    09/30/20 1622 10/01/20 0443 10/02/20 0300  NA 134* 138 136  K 6.0* 4.8 4.4  CL 102 103 105  CO2 23 28 24   GLUCOSE 286* 100* 114*  BUN 35* 29* 19  CREATININE 0.79 0.51 0.54  CALCIUM 8.8* 9.2 8.3*   LFT Recent Labs    10/02/20 0300  PROT 6.2*  ALBUMIN 3.0*  AST 53*  ALT 32  ALKPHOS 122  BILITOT 0.7  BILIDIR <0.1  IBILI NOT CALCULATED   PT/INR No results for input(s): LABPROT, INR in the last 72 hours. Hepatitis Panel No results for input(s): HEPBSAG, HCVAB, HEPAIGM, HEPBIGM in the last 72 hours.  CT ABDOMEN PELVIS WO CONTRAST  Result Date: 10/01/2020 CLINICAL DATA:  Abdominal pain.  ERCP yesterday. EXAM: CT ABDOMEN AND PELVIS WITHOUT CONTRAST TECHNIQUE: Multidetector CT imaging of the abdomen and pelvis was performed following the standard protocol without IV contrast. COMPARISON:  09/17/2020 FINDINGS: Lower chest: Basilar atelectasis. Hepatobiliary: Nodular liver contour compatible with cirrhosis. Percutaneous pigtail catheter again noted with the loop formed in the gallbladder fossa. Adjacent common bile duct stents evident. Pneumobilia is compatible with the presence of the stents. Pancreas: No focal mass lesion. No dilatation of the main duct. No intraparenchymal cyst. No peripancreatic edema. Spleen: No splenomegaly. No focal mass lesion. Adrenals/Urinary Tract: No adrenal nodule or mass. Kidneys unremarkable. No evidence for hydroureter. The urinary bladder appears normal for the degree of distention. Stomach/Bowel: Stomach is unremarkable. No gastric wall thickening. No evidence of outlet obstruction. Duodenum is normally positioned as is the ligament of Treitz. No small bowel wall thickening. No small bowel dilatation. The terminal ileum is normal. No gross colonic mass. No colonic wall thickening. Vascular/Lymphatic: No  abdominal aortic aneurysm there is some minimal edema around the head of pancreas and in the hepatoduodenal ligament, nonspecific given recent procedure. There is no gastrohepatic or hepatoduodenal ligament lymphadenopathy. No retroperitoneal or mesenteric lymphadenopathy. No pelvic sidewall lymphadenopathy. Reproductive: The uterus is surgically absent. There is no adnexal mass. Other: No intraperitoneal free fluid. Musculoskeletal: No worrisome lytic or sclerotic osseous abnormality. IMPRESSION: 1. Percutaneous pigtail catheter again noted with the loop formed in the gallbladder fossa. 2. Adjacent common bile duct stents with pneumobilia compatible with the presence of the stents. No associated intra or extrahepatic biliary duct dilatation. 3. Minimal edema around the head of pancreas and in the hepatoduodenal ligament, nonspecific given recent procedure. 4. Cirrhosis. Electronically Signed   By: Misty Stanley M.D.   On: 10/01/2020 20:27   DG Ribs Unilateral Left  Result Date: 09/30/2020 CLINICAL DATA:  [place] onto some steps 1 week ago. Left anterior/inferior chest wall/rib pain. EXAM: LEFT RIBS - 2 VIEW COMPARISON:  Current chest radiograph. Prior exams. Prior chest CT dated 11/07/2019. FINDINGS: No fracture or other bone lesions are  seen involving the ribs. IMPRESSION: Negative. Electronically Signed   By: Lajean Manes M.D.   On: 09/30/2020 16:38   DG Chest Port 1 View  Result Date: 09/30/2020 CLINICAL DATA:  Recent ERCP with pain, initial encounter EXAM: PORTABLE CHEST 1 VIEW COMPARISON:  07/08/2020 FINDINGS: Cardiac shadow is at the upper limits of normal in size. Mild bibasilar atelectasis is noted. The lungs are otherwise clear. No bony abnormality is seen. IMPRESSION: Mild bibasilar atelectasis. Electronically Signed   By: Inez Catalina M.D.   On: 09/30/2020 15:44   DG ERCP  Result Date: 09/30/2020 CLINICAL DATA:  55 year old female with history of cholecystectomy and postoperative bile leak EXAM:  ERCP TECHNIQUE: Multiple spot images obtained with the fluoroscopic device and submitted for interpretation post-procedure. FLUOROSCOPY TIME:  Fluoroscopy Time:  8 minutes 15 seconds COMPARISON:  08/28/2020 FINDINGS: Limited intraoperative fluoroscopic spot images during ERCP. Initial image demonstrates the endoscope projecting over the upper abdomen with plastic biliary stents in place. Subsequently there is removal of a plastic biliary stents and then placement of a safety wire cross the ampulla. Retrograde infusion of contrast. There is deployment of a retrieval balloon. Images also demonstrate contrast accumulating in extra lumen location adjacent tube pigtail drainage catheter. Final image demonstrates deployment of parallel metallic biliary stent and plastic biliary stent. IMPRESSION: Limited intraoperative fluoroscopic spot images during ERCP demonstrates treatment of ongoing biliary leak with exchange of plastic biliary stents for new parallel plastic and metallic biliary stents and deployment of retrieval balloon. Please refer to the dictated operative report for full details of intraoperative findings and procedure. Electronically Signed   By: Corrie Mckusick D.O.   On: 09/30/2020 13:24   DG Abd Portable 2V  Result Date: 09/30/2020 CLINICAL DATA:  Abdominal pain following ERCP EXAM: PORTABLE ABDOMEN - 2 VIEW COMPARISON:  ERCP filled films from earlier in the same day, prior plain film from 09/17/2020 FINDINGS: Scattered large and small bowel gas is noted. No free air is seen. Two of the previously seen 3 common bile duct stents have been removed in the interval. A new metallic wall stent is noted adjacent to the residual plastic stent. Cholecystostomy tube is noted in the right upper quadrant. No other focal abnormality is noted. IMPRESSION: Interval placement of metallic wall stent adjacent to a residual plastic stent in the common bile duct. Cholecystostomy tube remains in place. No acute  intra-abdominal abnormality is noted. Electronically Signed   By: Inez Catalina M.D.   On: 09/30/2020 15:43      Principal Problem:   Abdominal pain Active Problems:   Diabetes mellitus without complication (HCC)   Hypertension   Bile leak     LOS: 1 day   [name] ,NP 10/02/2020, 10:39 AM

## 2020-10-02 NOTE — Progress Notes (Signed)
PROGRESS NOTE    Kim Hicks  RXV:[id number] DOB: 14-Mar-1966 DOA: 09/30/2020 PCP: [name], FNP    No chief complaint on file.   Brief Narrative:  55 y.o.femalewith medical history significant foranxiety, depression, liver cirrhosis, hypertension, chronic respiratory failure on 2 L of oxygen at bedtime, hypertension who is status post ERCP for persistent bile leak following a cholecystectomy that was done on03/20/21.  She had stents placed 2 months ago but continued to have bile leak and so had an ERCP done today with removal and exchange of biliary stent as well as sphincterotomy.  She developed post procedure pain and was admitted to the hospitalist service for further management.     Assessment & Plan:   Principal Problem:   Abdominal pain Active Problems:   Diabetes mellitus without complication (HCC)   Depression   Hypertension   Bile leak  1 abdominal pain postprocedure/post ERCP abdominal pain -Questionable etiology.?  Post ERCP pancreatitis  -Patient status post ERCP 09/30/2020 with removal of 4 partially occluded stent from the biliary tree, partially migrated downward still still within the CBD.  Bile leak found from low-lying cystic duct.  Choledocholithiasis within cystic duct and CBD complete removal accomplished with sweeping.  Plastic biliary stent placed into the cystic duct and a fully covered metal biliary stent placed into the CBD.  Cholangiogram did not show persistent leak.  Status post chronic cholecystostomy tube. -Minimally elevated at 106 postprocedure. -CT abdomen and pelvis with minimal edema around head of pancreas, hepatoduodenal ligament, cirrhosis.  No biliary duct dilatation.  Percutaneous pigtail catheter in gallbladder fossa. -Patient still with abdominal pain stating unable to tolerate Dilaudid due to itching. -Tolerating full liquid diet. -Asking for diet to be advanced. -Discontinue IV Dilaudid and placed on IV morphine as needed. -Resume  home regimen Linzess -Resume home regimen [name]. -Oxycodone 5 mg p.o. every 4 hours as needed breakthrough pain. -GI following and appreciate input and recommendations.  2.  Cirrhosis -Noted on imaging -Per GI.  3.  Diabetes mellitus type 2 -Hemoglobin A1c 9.5 (09/30/2020). -CBG 74 this morning. -Continue to hold oral hypoglycemic agents and basal Lantus. -SSI.  4.  Depression -Continue home regimen Seroquel, [name], [name].  Resume home regimen Xanax.  5.  Hypertension -Continue to hold lisinopril.  6.  Chronic pain -Resumed home regimen Lyrica. -Resume home regimen [name].   DVT prophylaxis: SCDs Code Status: Full Family Communication: Updated patient.  No family at bedside. Disposition:   Status is: Inpatient    Dispo: The patient is from: Home              Anticipated d/c is to: Home              Patient currently with abdominal pain, status post ERCP with stent exchange, biliary sludge, diet being advanced.  Not stable for discharge.   Difficult to place patient no       Consultants:   Gastroenterology  Procedures:   ERCP per Dr. Rush Landmark 09/30/2020  CT abdomen and pelvis 10/01/2020  Plain films of the left ribs 09/30/2020  Chest x-ray 09/30/2020  Abdominal films 09/30/2020  Antimicrobials:   None   Subjective: C/o abd pain slightly improved from yesterday. States itching from dilaudid and would like pain medication changed.  No chest pain.  No shortness of breath.  Tolerating full liquid diet.  Asking for diet to be advanced.  Objective: Vitals:   10/01/20 1306 10/01/20 2129 10/02/20 0524 10/02/20 1313  BP: 135/78 134/72 109/63 128/62  Pulse: 85 89 88 93  Resp: 18 18 15 16   Temp: 98.2 F (36.8 C) 98.2 F (36.8 C) 98.5 F (36.9 C) 98.2 F (36.8 C)  TempSrc: Oral Oral Oral Oral  SpO2: 95% 92% 98% 98%  Weight:      Height:        Intake/Output Summary (Last 24 hours) at 10/02/2020 1652 Last data filed at 10/02/2020 1400 Gross per 24  hour  Intake 1423.5 ml  Output 0 ml  Net 1423.5 ml   Filed Weights   09/25/20 1454 09/30/20 1012  Weight: 77.1 kg 77.1 kg    Examination:  General exam: Appears calm and comfortable  Respiratory system: Clear to auscultation bilaterally.  Some decreased breath sounds in the bases.  No wheezing.  No crackles.  Speaking in full sentences.[name] Respiratory effort normal. Cardiovascular system: S1 & S2 heard, RRR. No JVD, murmurs, rubs, gallops or clicks. No pedal edema. Gastrointestinal system: Abdomen is nondistended, soft and tender to palpation in the epigastrium and upper abdominal region.  Positive bowel sounds.  No rebound.  No guarding.  Biliary drain bag with bilious drainage noted.   Central nervous system: Alert and oriented. No focal neurological deficits. Extremities: Symmetric 5 x 5 power. Skin: No rashes, lesions or ulcers Psychiatry: Judgement and insight appear normal. Mood & affect appropriate.     Data Reviewed: I have personally reviewed following labs and imaging studies  CBC: Recent Labs  Lab 09/30/20 1622 10/01/20 0443 10/02/20 0300  WBC 6.6 5.6 4.7  NEUTROABS 6.0  --   --   HGB 10.8* 10.6* 9.2*  HCT 37.2 36.3 31.9*  MCV 86.3 85.4 86.7  PLT 171 180 146*    Basic Metabolic Panel: Recent Labs  Lab 09/30/20 1622 10/01/20 0443 10/02/20 0300  NA 134* 138 136  K 6.0* 4.8 4.4  CL 102 103 105  CO2 23 28 24   GLUCOSE 286* 100* 114*  BUN 35* 29* 19  CREATININE 0.79 0.51 0.54  CALCIUM 8.8* 9.2 8.3*    GFR: Estimated Creatinine Clearance: 81.5 mL/min (by C-G formula based on SCr of 0.54 mg/dL).  Liver Function Tests: Recent Labs  Lab 09/30/20 1632 10/01/20 0443 10/02/20 0300  AST 60* 51* 53*  ALT 42 36 32  ALKPHOS 180* 168* 122  BILITOT 0.7 0.2* 0.7  PROT 7.7 7.6 6.2*  ALBUMIN 3.8 3.7 3.0*    CBG: Recent Labs  Lab 10/01/20 2340 10/02/20 0338 10/02/20 0726 10/02/20 1149 10/02/20 1552  GLUCAP 114* 106* 74 159* 119*     Recent Results  (from the past 240 hour(s))  SARS CORONAVIRUS 2 (TAT 6-24 HRS) Nasopharyngeal Nasopharyngeal Swab     Status: None   Collection Time: 09/26/20  9:53 AM   Specimen: Nasopharyngeal Swab  Result Value Ref Range Status   SARS Coronavirus 2 NEGATIVE NEGATIVE Final    Comment: (NOTE) SARS-CoV-2 target nucleic acids are NOT DETECTED.  The SARS-CoV-2 RNA is generally detectable in upper and lower respiratory specimens during the acute phase of infection. Negative results do not preclude SARS-CoV-2 infection, do not rule out co-infections with other pathogens, and should not be used as the sole basis for treatment or other patient management decisions. Negative results must be combined with clinical observations, patient history, and epidemiological information. The expected result is Negative.  Fact Sheet for Patients: SugarRoll.be  Fact Sheet for Healthcare Providers: https://www.woods-mathews.com/  This test is not yet approved or cleared by the [place] and  has been authorized  for detection and/or diagnosis of SARS-CoV-2 by FDA under an Emergency Use Authorization (EUA). This EUA will remain  in effect (meaning this test can be used) for the duration of the COVID-19 declaration under Se ction 564(b)(1) of the Act, 21 U.S.C. section 360bbb-3(b)(1), unless the authorization is terminated or revoked sooner.  Performed at [facility], [place] [address] [place], [place] [address]          Radiology Studies: CT ABDOMEN PELVIS WO CONTRAST  Result Date: 10/01/2020 CLINICAL DATA:  Abdominal pain.  ERCP yesterday. EXAM: CT ABDOMEN AND PELVIS WITHOUT CONTRAST TECHNIQUE: Multidetector CT imaging of the abdomen and pelvis was performed following the standard protocol without IV contrast. COMPARISON:  09/17/2020 FINDINGS: Lower chest: Basilar atelectasis. Hepatobiliary: Nodular liver contour compatible with cirrhosis. Percutaneous  pigtail catheter again noted with the loop formed in the gallbladder fossa. Adjacent common bile duct stents evident. Pneumobilia is compatible with the presence of the stents. Pancreas: No focal mass lesion. No dilatation of the main duct. No intraparenchymal cyst. No peripancreatic edema. Spleen: No splenomegaly. No focal mass lesion. Adrenals/Urinary Tract: No adrenal nodule or mass. Kidneys unremarkable. No evidence for hydroureter. The urinary bladder appears normal for the degree of distention. Stomach/Bowel: Stomach is unremarkable. No gastric wall thickening. No evidence of outlet obstruction. Duodenum is normally positioned as is the ligament of Treitz. No small bowel wall thickening. No small bowel dilatation. The terminal ileum is normal. No gross colonic mass. No colonic wall thickening. Vascular/Lymphatic: No abdominal aortic aneurysm there is some minimal edema around the head of pancreas and in the hepatoduodenal ligament, nonspecific given recent procedure. There is no gastrohepatic or hepatoduodenal ligament lymphadenopathy. No retroperitoneal or mesenteric lymphadenopathy. No pelvic sidewall lymphadenopathy. Reproductive: The uterus is surgically absent. There is no adnexal mass. Other: No intraperitoneal free fluid. Musculoskeletal: No worrisome lytic or sclerotic osseous abnormality. IMPRESSION: 1. Percutaneous pigtail catheter again noted with the loop formed in the gallbladder fossa. 2. Adjacent common bile duct stents with pneumobilia compatible with the presence of the stents. No associated intra or extrahepatic biliary duct dilatation. 3. Minimal edema around the head of pancreas and in the hepatoduodenal ligament, nonspecific given recent procedure. 4. Cirrhosis. Electronically Signed   By: Misty Stanley M.D.   On: 10/01/2020 20:27        Scheduled Meds: . brexpiprazole  2 mg Oral QHS  . Chlorhexidine Gluconate Cloth  6 each Topical Daily  . insulin aspart  0-15 Units Subcutaneous  Q4H  . [START ON 10/03/2020] linaclotide  290 mcg Oral QAC breakfast  . pantoprazole  40 mg Oral Daily  . pregabalin  100 mg Oral BID WC  . pregabalin  200 mg Oral QHS  . QUEtiapine  200 mg Oral QHS  . traMADol  50 mg Oral BID  . vortioxetine HBr  20 mg Oral QHS  . ziprasidone  20 mg Oral QHS   Continuous Infusions: . sodium chloride 125 mL/hr at 10/02/20 0540     LOS: 1 day    Time spent: 35 minutes    Irine Seal, MD [facility]   To contact the attending provider between 7A-7P or the covering provider during after hours 7P-7A, please log into the web site [email or web address] and access using universal [place] password for that web site. If you do not have the password, please call the hospital operator.  10/02/2020, 4:52 PM

## 2020-10-03 ENCOUNTER — Other Ambulatory Visit: Payer: Self-pay | Admitting: Internal Medicine

## 2020-10-03 ENCOUNTER — Telehealth: Payer: Self-pay | Admitting: Pharmacy Technician

## 2020-10-03 DIAGNOSIS — K9189 Other postprocedural complications and disorders of digestive system: Secondary | ICD-10-CM

## 2020-10-03 DIAGNOSIS — D509 Iron deficiency anemia, unspecified: Secondary | ICD-10-CM | POA: Diagnosis not present

## 2020-10-03 DIAGNOSIS — R1013 Epigastric pain: Secondary | ICD-10-CM

## 2020-10-03 DIAGNOSIS — K859 Acute pancreatitis without necrosis or infection, unspecified: Secondary | ICD-10-CM

## 2020-10-03 DIAGNOSIS — K838 Other specified diseases of biliary tract: Secondary | ICD-10-CM

## 2020-10-03 DIAGNOSIS — K7469 Other cirrhosis of liver: Secondary | ICD-10-CM

## 2020-10-03 DIAGNOSIS — K219 Gastro-esophageal reflux disease without esophagitis: Secondary | ICD-10-CM

## 2020-10-03 LAB — COMPREHENSIVE METABOLIC PANEL
ALT: 24 U/L (ref 0–44)
AST: 39 U/L (ref 15–41)
Albumin: 3 g/dL — ABNORMAL LOW (ref 3.5–5.0)
Alkaline Phosphatase: 117 U/L (ref 38–126)
Anion gap: 3 — ABNORMAL LOW (ref 5–15)
BUN: 11 mg/dL (ref 6–20)
CO2: 29 mmol/L (ref 22–32)
Calcium: 8.2 mg/dL — ABNORMAL LOW (ref 8.9–10.3)
Chloride: 109 mmol/L (ref 98–111)
Creatinine, Ser: 0.61 mg/dL (ref 0.44–1.00)
GFR, Estimated: >60 mL/min (ref >60)
Glucose, Bld: 163 mg/dL — ABNORMAL HIGH (ref 70–99)
Potassium: 3.5 mmol/L (ref 3.5–5.1)
Sodium: 141 mmol/L (ref 135–145)
Total Bilirubin: 0.4 mg/dL (ref 0.3–1.2)
Total Protein: 6 g/dL — ABNORMAL LOW (ref 6.5–8.1)

## 2020-10-03 LAB — MAGNESIUM: Magnesium: 1.8 mg/dL (ref 1.7–2.4)

## 2020-10-03 LAB — GLUCOSE, CAPILLARY
Glucose-Capillary: 147 mg/dL — ABNORMAL HIGH (ref 70–99)
Glucose-Capillary: 184 mg/dL — ABNORMAL HIGH (ref 70–99)
Glucose-Capillary: 190 mg/dL — ABNORMAL HIGH (ref 70–99)
Glucose-Capillary: 214 mg/dL — ABNORMAL HIGH (ref 70–99)

## 2020-10-03 LAB — CBC
HCT: 31.6 % — ABNORMAL LOW (ref 36.0–46.0)
Hemoglobin: 9 g/dL — ABNORMAL LOW (ref 12.0–15.0)
MCH: 24.7 pg — ABNORMAL LOW (ref 26.0–34.0)
MCHC: 28.5 g/dL — ABNORMAL LOW (ref 30.0–36.0)
MCV: 86.6 fL (ref 80.0–100.0)
Platelets: 137 10*3/uL — ABNORMAL LOW (ref 150–400)
RBC: 3.65 MIL/uL — ABNORMAL LOW (ref 3.87–5.11)
RDW: 18.8 % — ABNORMAL HIGH (ref 11.5–15.5)
WBC: 3.8 10*3/uL — ABNORMAL LOW (ref 4.0–10.5)
nRBC: 0 % (ref 0.0–0.2)

## 2020-10-03 LAB — LIPASE, BLOOD: Lipase: 127 U/L — ABNORMAL HIGH (ref 11–51)

## 2020-10-03 MED ORDER — POLYSACCHARIDE IRON COMPLEX 150 MG PO CAPS: 150.0000 mg | ORAL_CAPSULE | Freq: Every day | ORAL | 1 refills | Status: DC

## 2020-10-03 MED ORDER — POLYSACCHARIDE IRON COMPLEX 150 MG PO CAPS: 150.0000 mg | ORAL_CAPSULE | Freq: Every day | ORAL | Status: DC

## 2020-10-03 MED ORDER — SODIUM CHLORIDE 0.9 % IV SOLN
510.0000 mg | Freq: Once | INTRAVENOUS | Status: AC
  Administered 2020-10-03: 510 mg via INTRAVENOUS
  Filled 2020-10-03: qty 510

## 2020-10-03 MED ORDER — POTASSIUM CHLORIDE CRYS ER 20 MEQ PO TBCR
40.0000 meq | EXTENDED_RELEASE_TABLET | Freq: Once | ORAL | Status: AC
  Administered 2020-10-03: 40 meq via ORAL
  Filled 2020-10-03: qty 2

## 2020-10-03 MED ORDER — MAGNESIUM SULFATE 2 GM/50ML IV SOLN
2.0000 g | Freq: Once | INTRAVENOUS | Status: AC
Start: 2020-10-03 — End: 2020-10-03
  Administered 2020-10-03: 2 g via INTRAVENOUS
  Filled 2020-10-03: qty 50

## 2020-10-03 NOTE — Telephone Encounter (Signed)
Dr.  Cellar Submission: NO AUTH REQUIRED Payer: MEDICAID/MEDICARE  Medication & CPT/J Code(s) submitted: Feraheme (ferumoxytol) [id number] Route of submission (phone, fax, portal):  Buy/Bill Units/visits requested: 2

## 2020-10-03 NOTE — Progress Notes (Addendum)
Progress Note  Chief Complaint:    Pancreatitis.      ASSESSMENT / PLAN:    # 55 yo female with complex biliary history. She had a post-cholecystectomy bile leak in 2021 and has since required several ERCP with stent exchanges for migration / occlusion. Most recent ERCP on 5/9 with removal of 4 partially occluded stents from the biliary tree partially migrated downwards though still within the CBD. A bile leak was found from a low-lying cystic duct. Choledocholithiasis was found within the cystic duct and CBD complete removal was accomplished by sweeping. A plastic biliary stent was placed into the cystic duct and a fully covered metal biliary stent was placed into the CBD.  Completion cholangiogram did not show persistent leakage. She has a chronic cholecystostomy tube  --Liver chemistries have normalized  # Post ERCP pain.  --Lipase 127 today. .  --CT scan yesterday demonstrates minimal edema around HOP and hepatoduodenal ligament, cirrhosis. No biliary duct dilation. Percutaneous pigtail catheter in GB fossa. --Pain controlled on Oxycodone and [name]. .   --Tolerated diet was advance to heart healthy yesterday.     # Cirrhosis by imaging . No evidence for decompensation --Mobic on home list. This should be discontinued since she has cirrhosis.   # Pancytopenia ( cirrhosis), stable.  Hgb stable at 9, MCV 86 but she is iron deficient with low ferritin.  --Ordered a dose of IV iron. Home on oral iron once daily.  --She still needs a colonoscopy. Stool filled colon on the last one in January 2021. Was advised to have a repeat colonoscopy but has since been dealing with bile duct leak. --Patient going home today, will see in clinic and arrange for colonoscopy. Appt on 10/25/20 at [place] much better today. Pain better.     OBJECTIVE:    Scheduled inpatient medications:  . brexpiprazole  2 mg Oral QHS  . Chlorhexidine Gluconate Cloth  6 each Topical Daily   . insulin aspart  0-15 Units Subcutaneous Q4H  . linaclotide  290 mcg Oral QAC breakfast  . pantoprazole  40 mg Oral Daily  . pregabalin  100 mg Oral BID WC  . pregabalin  200 mg Oral QHS  . QUEtiapine  200 mg Oral QHS  . traMADol  50 mg Oral BID  . vortioxetine HBr  20 mg Oral QHS  . ziprasidone  20 mg Oral QHS   Continuous inpatient infusions:  . sodium chloride 125 mL/hr at 10/02/20 2117   PRN inpatient medications: acetaminophen, ALPRAZolam, diphenhydrAMINE, lactulose, morphine injection, ondansetron **OR** ondansetron (ZOFRAN) IV, oxyCODONE, sodium chloride flush  Vital signs in last 24 hours: Temp:  [98.2 F (36.8 C)-98.5 F (36.9 C)] 98.5 F (36.9 C) (05/12 0519) Pulse Rate:  [83-93] 83 (05/12 0519) Resp:  [16] 16 (05/12 0519) BP: (114-148)/(62-72) 114/63 (05/12 0519) SpO2:  [98 %-99 %] 99 % (05/12 0519) Last BM Date: 10/02/20  Intake/Output Summary (Last 24 hours) at 10/03/2020 0930 Last data filed at 10/03/2020 0506 Gross per 24 hour  Intake 3831.46 ml  Output 30 ml  Net 3801.46 ml     Physical Exam:  . General: Alert female in NAD . Heart:  Regular rate and rhythm. No lower extremity edema . Pulmonary: Normal respiratory effort . Abdomen: Soft, nondistended, nontender. Normal bowel sounds.  . Neurologic: Alert and oriented . Psych: Pleasant. Cooperative.   Filed Weights   09/25/20 1454 09/30/20 1012  Weight: 77.1  kg 77.1 kg    Intake/Output from previous day: 05/11 0701 - 05/12 0700 In: 3831.5 [P.O.:960; I.V.:2866.5] Out: 30 [Drains:30] Intake/Output this shift: No intake/output data recorded.    Lab Results: Recent Labs    10/01/20 0443 10/02/20 0300 10/03/20 0549  WBC 5.6 4.7 3.8*  HGB 10.6* 9.2* 9.0*  HCT 36.3 31.9* 31.6*  PLT 180 146* 137*   BMET Recent Labs    10/01/20 0443 10/02/20 0300 10/03/20 0549  NA 138 136 141  K 4.8 4.4 3.5  CL 103 105 109  CO2 28 24 29   GLUCOSE 100* 114* 163*  BUN 29* 19 11  CREATININE 0.51 0.54  0.61  CALCIUM 9.2 8.3* 8.2*   LFT Recent Labs    10/02/20 0300 10/03/20 0549  PROT 6.2* 6.0*  ALBUMIN 3.0* 3.0*  AST 53* 39  ALT 32 24  ALKPHOS 122 117  BILITOT 0.7 0.4  BILIDIR <0.1  --   IBILI NOT CALCULATED  --    PT/INR No results for input(s): LABPROT, INR in the last 72 hours. Hepatitis Panel No results for input(s): HEPBSAG, HCVAB, HEPAIGM, HEPBIGM in the last 72 hours.  CT ABDOMEN PELVIS WO CONTRAST  Result Date: 10/01/2020 CLINICAL DATA:  Abdominal pain.  ERCP yesterday. EXAM: CT ABDOMEN AND PELVIS WITHOUT CONTRAST TECHNIQUE: Multidetector CT imaging of the abdomen and pelvis was performed following the standard protocol without IV contrast. COMPARISON:  09/17/2020 FINDINGS: Lower chest: Basilar atelectasis. Hepatobiliary: Nodular liver contour compatible with cirrhosis. Percutaneous pigtail catheter again noted with the loop formed in the gallbladder fossa. Adjacent common bile duct stents evident. Pneumobilia is compatible with the presence of the stents. Pancreas: No focal mass lesion. No dilatation of the main duct. No intraparenchymal cyst. No peripancreatic edema. Spleen: No splenomegaly. No focal mass lesion. Adrenals/Urinary Tract: No adrenal nodule or mass. Kidneys unremarkable. No evidence for hydroureter. The urinary bladder appears normal for the degree of distention. Stomach/Bowel: Stomach is unremarkable. No gastric wall thickening. No evidence of outlet obstruction. Duodenum is normally positioned as is the ligament of Treitz. No small bowel wall thickening. No small bowel dilatation. The terminal ileum is normal. No gross colonic mass. No colonic wall thickening. Vascular/Lymphatic: No abdominal aortic aneurysm there is some minimal edema around the head of pancreas and in the hepatoduodenal ligament, nonspecific given recent procedure. There is no gastrohepatic or hepatoduodenal ligament lymphadenopathy. No retroperitoneal or mesenteric lymphadenopathy. No pelvic  sidewall lymphadenopathy. Reproductive: The uterus is surgically absent. There is no adnexal mass. Other: No intraperitoneal free fluid. Musculoskeletal: No worrisome lytic or sclerotic osseous abnormality. IMPRESSION: 1. Percutaneous pigtail catheter again noted with the loop formed in the gallbladder fossa. 2. Adjacent common bile duct stents with pneumobilia compatible with the presence of the stents. No associated intra or extrahepatic biliary duct dilatation. 3. Minimal edema around the head of pancreas and in the hepatoduodenal ligament, nonspecific given recent procedure. 4. Cirrhosis. Electronically Signed   By: Misty Stanley M.D.   On: 10/01/2020 20:27      Principal Problem:   Abdominal pain Active Problems:   Diabetes mellitus without complication ([place])   Depression   Hypertension   Bile leak     LOS: 2 days   [name] ,NP 10/03/2020, 9:30 AM

## 2020-10-03 NOTE — Progress Notes (Signed)
Discharge instructions given to patient and all questions were answered.  

## 2020-10-03 NOTE — Progress Notes (Signed)
Pt hospitalized, had dose 1 of feraheme Needs dose #2 in about 7 days

## 2020-10-03 NOTE — Discharge Summary (Signed)
Physician Discharge Summary  Kim Hicks OBS:[id number] DOB: 1966-05-09 DOA: 09/30/2020  PCP: [name], FNP  Admit date: 09/30/2020 Discharge date: 10/03/2020  Time spent: 50 minutes  Recommendations for Outpatient Follow-up:  1. Follow-up with [name], NP gastroenterology on 10/25/2020\ 2. Follow-up with [name], FNP in 2 to 3 weeks.   Discharge Diagnoses:  Principal Problem:   Abdominal pain Active Problems:   Post-ERCP acute pancreatitis   Diabetes mellitus without complication (HCC)   Depression   Hypertension   GERD   Other cirrhosis of liver (HCC)   Bile leak   Hyperlipidemia   Iron deficiency anemia   Discharge Condition: Stable and improved  Diet recommendation: Heart healthy  Filed Weights   09/25/20 1454 09/30/20 1012  Weight: 77.1 kg 77.1 kg    History of present illness:  HPI per [name] is a 55 y.o. female with medical history significant for anxiety, depression, liver cirrhosis, hypertension, chronic respiratory failure on 2 L of oxygen at bedtime, hypertension who is status post ERCP for persistent bile leak following a cholecystectomy that was done on 08/12/19.  Patient had stents placed 2 months ago but continued to have bile leak and so had an ERCP done on the day of admission with removal and exchange of biliary stent as well as sphincterotomy. Post procedure patient developed worsening abdominal pain mostly in the left upper quadrant, right upper as well as right lower quadrant.  She stated that she had pain in the left upper quadrant which was initially a 4 x 10 in intensity following a fall a couple of days prior to her admission but now the intensity of her pain in the left upper quadrant is about an 8 x 10.  Abdominal pain is associated with nausea but no vomiting.  Patient received 100 mg of fentanyl while in recovery as well as 20 mg of Toradol without any significant improvement in her pain. Due to her history of post  ERCP pancreatitis and concerns for possible complication postprocedure, observation was requested for further evaluation. She denies having any chest pain, no shortness of breath, no dizziness, no lightheadedness, no headache, no palpitations, no diaphoresis, no urinary symptoms, no blurred vision, no weakness Labs are still pending at the time of this H&P Abdominal x-ray shows interval placement of metallic wall stent adjacent to a residual plastic stent in the common bile duct. Cholecystostomy tube remains in place. No acute intra-abdominal abnormality is noted. Chest x-ray reviewed by me shows clear lungs  Hospital Course:  1 abdominal pain postprocedure/post ERCP abdominal pain/post ERCP pancreatitis -Questionable etiology.  Likely secondary to Post ERCP pancreatitis  -Patient status post ERCP 09/30/2020 with removal of 4 partially occluded stent from the biliary tree, partially migrated downward still still within the CBD.  Bile leak found from low-lying cystic duct.  Choledocholithiasis within cystic duct and CBD complete removal accomplished with sweeping.  Plastic biliary stent placed into the cystic duct and a fully covered metal biliary stent placed into the CBD.  Cholangiogram did not show persistent leak.  Status post chronic cholecystostomy tube. -Lipase was elevated at 106 postprocedure. -CT abdomen and pelvis with minimal edema around head of pancreas, hepatoduodenal ligament, cirrhosis.  No biliary duct dilatation.  Percutaneous pigtail catheter in gallbladder fossa. -Patient was placed on IV fluids, full liquid diet and initially on IV Dilaudid which patient was unable to tolerate due to itching.   -IV Dilaudid was changed to IV morphine as well as oxycodone  as needed which patient was able to tolerate.   -Patient improved clinically.   -Diet was advanced to a soft diet which patient tolerated.   -Patient also resumed back on home regimen of Linzess and [name].   -Patient improved  clinically and will be discharged home in stable and improved condition.   -Outpatient follow-up with GI.   2.  Cirrhosis -Noted on imaging -Patient followed by GI during the hospitalization will follow up with GI in the outpatient setting.    3.  Diabetes mellitus type 2 -Hemoglobin A1c 9.5 (09/30/2020). -Patient's oral hypoglycemic agents were held during the hospitalization patient maintained on sliding scale insulin.  4.  Depression -Patient maintained on home regimen of home regimen Seroquel, [name], [name], and Xanax.  5.  Hypertension -Patient's ACE inhibitor was held during the hospitalization.  Outpatient follow-up.  6.  Chronic pain -Patient placed back on home regimen of Lyrica and [name].  Outpatient follow-up.  7.  Iron deficiency anemia Patient noted to be anemic during the hospitalization. -Panel done had iron of 19, ferritin of 6.4, set ratio 4.3. -Patient received a dose of IV Feraheme during the hospitalization will receive another dose in the week in the outpatient setting. -Patient be discharged home on Nu-Iron 150 mg daily. -Outpatient follow-up with GI.   Procedures:  ERCP per Dr. Rush Landmark 09/30/2020  CT abdomen and pelvis 10/01/2020  Plain films of the left ribs 09/30/2020  Chest x-ray 09/30/2020  Abdominal films 09/30/2020    Consultations:  Gastroenterology   Discharge Exam: Vitals:   10/03/20 0519 10/03/20 1339  BP: 114/63 135/74  Pulse: 83 87  Resp: 16 18  Temp: 98.5 F (36.9 C) 98.4 F (36.9 C)  SpO2: 99% 97%    General: NAD Cardiovascular: RRR Respiratory: CTAB  Discharge Instructions   Discharge Instructions    Diet - low sodium heart healthy   Complete by: As directed    Increase activity slowly   Complete by: As directed      Allergies as of 10/03/2020      Reactions   Talwin [pentazocine]    Turned blue around lips and face, rash all over body      Medication List    STOP taking these medications    Iron 325 (65 Fe) MG Tabs   meloxicam 15 MG tablet Commonly known as: MOBIC     TAKE these medications   acetaminophen 500 MG tablet Commonly known as: TYLENOL Take 1,000 mg by mouth every 6 (six) hours as needed for moderate pain.   Aimovig 140 MG/ML Soaj Generic drug: Erenumab-aooe Inject 140 mg into the skin every 30 (thirty) days.   ALPRAZolam 0.5 MG tablet Commonly known as: XANAX Take 0.5 mg by mouth 2 (two) times daily as needed for anxiety.   Basaglar KwikPen 100 UNIT/ML Inject 40 Units into the skin at bedtime.   brexpiprazole 2 MG Tabs tablet Commonly known as: REXULTI Take 2 mg by mouth at bedtime.   Dulaglutide 4.5 MG/0.5ML Sopn Inject 4.5 mg into the skin every Monday.   ezetimibe 10 MG tablet Commonly known as: ZETIA Take 10 mg by mouth daily.   iron polysaccharides 150 MG capsule Commonly known as: NIFEREX Take 1 capsule (150 mg total) by mouth daily. Start taking on: Oct 04, 2020   lactulose 10 GM/15ML solution Commonly known as: CHRONULAC TAKE 15 MLS (10 G TOTAL) BY MOUTH 3 (THREE) TIMES DAILY AS NEEDED FOR MILD CONSTIPATION. What changed:   when to take this  reasons to take this   linaclotide 290 MCG Caps capsule Commonly known as: LINZESS Take 1 capsule (290 mcg total) by mouth daily before breakfast.   lisinopril 20 MG tablet Commonly known as: ZESTRIL Take 20 mg by mouth daily.   metFORMIN 1000 MG tablet Commonly known as: GLUCOPHAGE Take 1,000 mg by mouth at bedtime.   ondansetron 4 MG disintegrating tablet Commonly known as: ZOFRAN-ODT Take 4 mg by mouth every 8 (eight) hours as needed for nausea or vomiting.   oxyCODONE 5 MG immediate release tablet Commonly known as: Roxicodone Take 1 tablet (5 mg total) by mouth every 6 (six) hours as needed for severe pain.   pantoprazole 40 MG tablet Commonly known as: PROTONIX Take 40 mg by mouth daily.   prazosin 2 MG capsule Commonly known as: MINIPRESS Take 4 mg by mouth at  bedtime.   pregabalin 100 MG capsule Commonly known as: LYRICA One capsule in the morning and midday, 2 in the evening What changed:   how much to take  how to take this  when to take this  additional instructions   QUEtiapine 400 MG tablet Commonly known as: SEROQUEL Take 0.5 tablets (200 mg total) by mouth at bedtime. What changed: how much to take   QUEtiapine 50 MG tablet Commonly known as: SEROQUEL Take 100 mg by mouth every morning. What changed: Another medication with the same name was changed. Make sure you understand how and when to take each.   simvastatin 10 MG tablet Commonly known as: ZOCOR Take 10 mg by mouth daily.   sodium chloride flush 0.9 % Soln Commonly known as: NS Inject 5 mLs into the vein every 12 (twelve) hours.   Synjardy XR 25-1000 MG Tb24 Generic drug: Empagliflozin-metFORMIN HCl ER Take 1 tablet by mouth daily before breakfast.   traMADol 50 MG tablet Commonly known as: [name] Take 50 mg by mouth 2 (two) times daily.   vortioxetine HBr 20 MG Tabs tablet Commonly known as: [name] Take 20 mg by mouth at bedtime.   ziprasidone 20 MG capsule Commonly known as: [name] Take 20 mg by mouth at bedtime.      Allergies  Allergen Reactions  . Talwin [Pentazocine]     Turned blue around lips and face, rash all over body    Follow-up Information    Willia Craze, NP Follow up on 10/25/2020.   Specialty: Gastroenterology Why: at [facility] information: [place] [address] [phone]        [name], [place] Follow up in 2 week(s).   Specialty: Nurse Practitioner Why: f/u in 2-3 weeks. Contact information: [place] [address] [phone]                The results of significant diagnostics from this hospitalization (including imaging, microbiology, ancillary and laboratory) are listed below for reference.    Significant Diagnostic Studies: CT ABDOMEN PELVIS WO  CONTRAST  Result Date: 10/01/2020 CLINICAL DATA:  Abdominal pain.  ERCP yesterday. EXAM: CT ABDOMEN AND PELVIS WITHOUT CONTRAST TECHNIQUE: Multidetector CT imaging of the abdomen and pelvis was performed following the standard protocol without IV contrast. COMPARISON:  09/17/2020 FINDINGS: Lower chest: Basilar atelectasis. Hepatobiliary: Nodular liver contour compatible with cirrhosis. Percutaneous pigtail catheter again noted with the loop formed in the gallbladder fossa. Adjacent common bile duct stents evident. Pneumobilia is compatible with the presence of the stents. Pancreas: No focal mass lesion. No dilatation of the main duct. No intraparenchymal  cyst. No peripancreatic edema. Spleen: No splenomegaly. No focal mass lesion. Adrenals/Urinary Tract: No adrenal nodule or mass. Kidneys unremarkable. No evidence for hydroureter. The urinary bladder appears normal for the degree of distention. Stomach/Bowel: Stomach is unremarkable. No gastric wall thickening. No evidence of outlet obstruction. Duodenum is normally positioned as is the ligament of Treitz. No small bowel wall thickening. No small bowel dilatation. The terminal ileum is normal. No gross colonic mass. No colonic wall thickening. Vascular/Lymphatic: No abdominal aortic aneurysm there is some minimal edema around the head of pancreas and in the hepatoduodenal ligament, nonspecific given recent procedure. There is no gastrohepatic or hepatoduodenal ligament lymphadenopathy. No retroperitoneal or mesenteric lymphadenopathy. No pelvic sidewall lymphadenopathy. Reproductive: The uterus is surgically absent. There is no adnexal mass. Other: No intraperitoneal free fluid. Musculoskeletal: No worrisome lytic or sclerotic osseous abnormality. IMPRESSION: 1. Percutaneous pigtail catheter again noted with the loop formed in the gallbladder fossa. 2. Adjacent common bile duct stents with pneumobilia compatible with the presence of the stents. No associated intra  or extrahepatic biliary duct dilatation. 3. Minimal edema around the head of pancreas and in the hepatoduodenal ligament, nonspecific given recent procedure. 4. Cirrhosis. Electronically Signed   By: Misty Stanley M.D.   On: 10/01/2020 20:27   DG Ribs Unilateral Left  Result Date: 09/30/2020 CLINICAL DATA:  [place] onto some steps 1 week ago. Left anterior/inferior chest wall/rib pain. EXAM: LEFT RIBS - 2 VIEW COMPARISON:  Current chest radiograph. Prior exams. Prior chest CT dated 11/07/2019. FINDINGS: No fracture or other bone lesions are seen involving the ribs. IMPRESSION: Negative. Electronically Signed   By: Lajean Manes M.D.   On: 09/30/2020 16:38   DG Chest Port 1 View  Result Date: 09/30/2020 CLINICAL DATA:  Recent ERCP with pain, initial encounter EXAM: PORTABLE CHEST 1 VIEW COMPARISON:  07/08/2020 FINDINGS: Cardiac shadow is at the upper limits of normal in size. Mild bibasilar atelectasis is noted. The lungs are otherwise clear. No bony abnormality is seen. IMPRESSION: Mild bibasilar atelectasis. Electronically Signed   By: Inez Catalina M.D.   On: 09/30/2020 15:44   DG ERCP  Result Date: 09/30/2020 CLINICAL DATA:  54 year old female with history of cholecystectomy and postoperative bile leak EXAM: ERCP TECHNIQUE: Multiple spot images obtained with the fluoroscopic device and submitted for interpretation post-procedure. FLUOROSCOPY TIME:  Fluoroscopy Time:  8 minutes 15 seconds COMPARISON:  08/28/2020 FINDINGS: Limited intraoperative fluoroscopic spot images during ERCP. Initial image demonstrates the endoscope projecting over the upper abdomen with plastic biliary stents in place. Subsequently there is removal of a plastic biliary stents and then placement of a safety wire cross the ampulla. Retrograde infusion of contrast. There is deployment of a retrieval balloon. Images also demonstrate contrast accumulating in extra lumen location adjacent tube pigtail drainage catheter. Final image  demonstrates deployment of parallel metallic biliary stent and plastic biliary stent. IMPRESSION: Limited intraoperative fluoroscopic spot images during ERCP demonstrates treatment of ongoing biliary leak with exchange of plastic biliary stents for new parallel plastic and metallic biliary stents and deployment of retrieval balloon. Please refer to the dictated operative report for full details of intraoperative findings and procedure. Electronically Signed   By: Corrie Mckusick D.O.   On: 09/30/2020 13:24   DG Abd Portable 2V  Result Date: 09/30/2020 CLINICAL DATA:  Abdominal pain following ERCP EXAM: PORTABLE ABDOMEN - 2 VIEW COMPARISON:  ERCP filled films from earlier in the same day, prior plain film from 09/17/2020 FINDINGS: Scattered large and small bowel gas  is noted. No free air is seen. Two of the previously seen 3 common bile duct stents have been removed in the interval. A new metallic wall stent is noted adjacent to the residual plastic stent. Cholecystostomy tube is noted in the right upper quadrant. No other focal abnormality is noted. IMPRESSION: Interval placement of metallic wall stent adjacent to a residual plastic stent in the common bile duct. Cholecystostomy tube remains in place. No acute intra-abdominal abnormality is noted. Electronically Signed   By: Inez Catalina M.D.   On: 09/30/2020 15:43    Microbiology: Recent Results (from the past 240 hour(s))  SARS CORONAVIRUS 2 (TAT 6-24 HRS) Nasopharyngeal Nasopharyngeal Swab     Status: None   Collection Time: 09/26/20  9:53 AM   Specimen: Nasopharyngeal Swab  Result Value Ref Range Status   SARS Coronavirus 2 NEGATIVE NEGATIVE Final    Comment: (NOTE) SARS-CoV-2 target nucleic acids are NOT DETECTED.  The SARS-CoV-2 RNA is generally detectable in upper and lower respiratory specimens during the acute phase of infection. Negative results do not preclude SARS-CoV-2 infection, do not rule out co-infections with other pathogens, and  should not be used as the sole basis for treatment or other patient management decisions. Negative results must be combined with clinical observations, patient history, and epidemiological information. The expected result is Negative.  Fact Sheet for Patients: SugarRoll.be  Fact Sheet for Healthcare Providers: https://www.woods-mathews.com/  This test is not yet approved or cleared by the [place] FDA and  has been authorized for detection and/or diagnosis of SARS-CoV-2 by FDA under an Emergency Use Authorization (EUA). This EUA will remain  in effect (meaning this test can be used) for the duration of the COVID-19 declaration under Se ction 564(b)(1) of the Act, 21 U.S.C. section 360bbb-3(b)(1), unless the authorization is terminated or revoked sooner.  Performed at [facility], [place] [address] [place], [place] [address]      Labs: Basic Metabolic Panel: Recent Labs  Lab 09/30/20 1622 10/01/20 0443 10/02/20 0300 10/03/20 0549  NA 134* 138 136 141  K 6.0* 4.8 4.4 3.5  CL 102 103 105 109  CO2 23 28 24 29   GLUCOSE 286* 100* 114* 163*  BUN 35* 29* 19 11  CREATININE 0.79 0.51 0.54 0.61  CALCIUM 8.8* 9.2 8.3* 8.2*  MG  --   --   --  1.8   Liver Function Tests: Recent Labs  Lab 09/30/20 1632 10/01/20 0443 10/02/20 0300 10/03/20 0549  AST 60* 51* 53* 39  ALT 42 36 32 24  ALKPHOS 180* 168* 122 117  BILITOT 0.7 0.2* 0.7 0.4  PROT 7.7 7.6 6.2* 6.0*  ALBUMIN 3.8 3.7 3.0* 3.0*   Recent Labs  Lab 09/30/20 1622 10/01/20 0443 10/02/20 0300 10/03/20 0549  LIPASE 46 42 106* 127*   No results for input(s): AMMONIA in the last 168 hours. CBC: Recent Labs  Lab 09/30/20 1622 10/01/20 0443 10/02/20 0300 10/03/20 0549  WBC 6.6 5.6 4.7 3.8*  NEUTROABS 6.0  --   --   --   HGB 10.8* 10.6* 9.2* 9.0*  HCT 37.2 36.3 31.9* 31.6*  MCV 86.3 85.4 86.7 86.6  PLT 171 180 146* 137*   Cardiac Enzymes: No results for  input(s): CKTOTAL, CKMB, CKMBINDEX, TROPONINI in the last 168 hours. BNP: BNP (last 3 results) No results for input(s): BNP in the last 8760 hours.  ProBNP (last 3 results) No results for input(s): PROBNP in the last 8760 hours.  CBG: Recent  Labs  Lab 10/02/20 1944 10/03/20 0003 10/03/20 0428 10/03/20 0741 10/03/20 1143  GLUCAP 185* 190* 184* 147* 214*       Signed:  Irine Seal MD.  [facility] 10/03/2020, 2:20 PM

## 2020-10-03 NOTE — Telephone Encounter (Signed)
Kim Hicks Pt received 1 dose of feraheme this week while hospitalized, so she will only need 1 outpt dose in about 1 week. Thanks [employer]

## 2020-10-04 ENCOUNTER — Other Ambulatory Visit: Payer: Self-pay | Admitting: Pharmacy Technician

## 2020-10-04 ENCOUNTER — Telehealth: Payer: Self-pay

## 2020-10-04 NOTE — Telephone Encounter (Signed)
-----   Message from [name]., MD sent at 10/03/2020  5:12 PM EDT ----- PG, Follow-up in clinic with yourself or myself is fine.  Hopefully we see that her drainage continues to be low. ERCP with me in 3 months.  Kaleyah Labreck please move forward with getting that scheduled. Thanks. GM ----- Message ----- From: Willia Craze, NP Sent: 10/03/2020   9:43 AM EDT To: [name]., MD  Good morning,  This patient will hopefully be going home this am. She needs a 3 month ERCP with stent exchange so need to get that on the schedule. Do you want to see her , or I am happy to see in the interim?  Thanks  Pg

## 2020-10-04 NOTE — Telephone Encounter (Signed)
The pt has an appt with Nevin Bloodgood on 6/3.  Recall ERCP entered.

## 2020-10-07 ENCOUNTER — Telehealth: Payer: Self-pay | Admitting: Nurse Practitioner

## 2020-10-07 NOTE — Telephone Encounter (Signed)
Kim Hicks, This patient and her family have been discussed and described in the past significantly that any pain medications were only given at the time of procedure and would not be given on an outpatient basis otherwise or continued.  This is been discussed with Dr. Lavina Hamman and I in the past.  If the patient's pain is persisting then she may need repeat cross-sectional imaging and/or need her PCP to follow this up.  At the time of her discharge it looks like the GI team felt that she was stable. Please keep Dr. Fuller Plan and I up-to-date. Thanks. GM

## 2020-10-07 NOTE — Telephone Encounter (Signed)
Please advise 

## 2020-10-08 NOTE — Telephone Encounter (Signed)
Informed patient of information below. She will follow up with PCP.

## 2020-10-08 NOTE — Telephone Encounter (Signed)
Left message for patient to call office.  

## 2020-10-11 ENCOUNTER — Ambulatory Visit (INDEPENDENT_AMBULATORY_CARE_PROVIDER_SITE_OTHER): Payer: Medicare Other

## 2020-10-11 ENCOUNTER — Other Ambulatory Visit: Payer: Self-pay

## 2020-10-11 VITALS — BP 115/75 | HR 94 | Temp 97.7°F | Resp 16

## 2020-10-11 DIAGNOSIS — D509 Iron deficiency anemia, unspecified: Secondary | ICD-10-CM | POA: Diagnosis not present

## 2020-10-11 MED ORDER — HEPARIN SOD (PORK) LOCK FLUSH 100 UNIT/ML IV SOLN: 500.0000 [IU] | Freq: Once | INTRAVENOUS | Status: DC | PRN

## 2020-10-11 MED ORDER — FAMOTIDINE IN NACL 20-0.9 MG/50ML-% IV SOLN: 20.0000 mg | Freq: Once | INTRAVENOUS | Status: DC | PRN

## 2020-10-11 MED ORDER — ALBUTEROL SULFATE HFA 108 (90 BASE) MCG/ACT IN AERS: 2.0000 | INHALATION_SPRAY | Freq: Once | RESPIRATORY_TRACT | Status: DC | PRN

## 2020-10-11 MED ORDER — DIPHENHYDRAMINE HCL 50 MG/ML IJ SOLN: 50.0000 mg | Freq: Once | INTRAMUSCULAR | Status: DC | PRN

## 2020-10-11 MED ORDER — SODIUM CHLORIDE 0.9 % IV SOLN
510.0000 mg | Freq: Once | INTRAVENOUS | Status: AC
  Administered 2020-10-11: 510 mg via INTRAVENOUS
  Filled 2020-10-11: qty 17

## 2020-10-11 MED ORDER — ALTEPLASE 2 MG IJ SOLR: 2.0000 mg | Freq: Once | INTRAMUSCULAR | Status: DC | PRN

## 2020-10-11 MED ORDER — SODIUM CHLORIDE 0.9% FLUSH: 10.0000 mL | Freq: Once | INTRAVENOUS | Status: DC | PRN

## 2020-10-11 MED ORDER — SODIUM CHLORIDE 0.9% FLUSH: 3.0000 mL | Freq: Once | INTRAVENOUS | Status: DC | PRN

## 2020-10-11 MED ORDER — HEPARIN SOD (PORK) LOCK FLUSH 100 UNIT/ML IV SOLN: 250.0000 [IU] | Freq: Once | INTRAVENOUS | Status: DC | PRN

## 2020-10-11 MED ORDER — METHYLPREDNISOLONE SODIUM SUCC 125 MG IJ SOLR: 125.0000 mg | Freq: Once | INTRAMUSCULAR | Status: DC | PRN

## 2020-10-11 MED ORDER — SODIUM CHLORIDE 0.9 % IV SOLN: Freq: Once | INTRAVENOUS | Status: DC | PRN

## 2020-10-11 MED ORDER — ANTICOAGULANT SODIUM CITRATE 4% (200MG/5ML) IV SOLN
5.0000 mL | Freq: Once | Status: DC | PRN
  Filled 2020-10-11: qty 5

## 2020-10-11 MED ORDER — EPINEPHRINE 0.3 MG/0.3ML IJ SOAJ: 0.3000 mg | Freq: Once | INTRAMUSCULAR | Status: DC | PRN

## 2020-10-11 NOTE — Progress Notes (Signed)
Diagnosis: Iron Deficiency Anemia  Provider:  Marshell Garfinkel, MD  Procedure: Infusion  IV Type: Peripheral, IV Location: L Hand  Feraheme (Ferumoxytol), Dose: 510 mg  Infusion Start Time: 0938  Infusion Stop Time: 2574  Post Infusion IV Care: Observation period completed and Peripheral IV Discontinued  Discharge: Condition: Good, Destination: Home . AVS provided to patient.   Performed by:  Koren Shiver, RN

## 2020-10-16 ENCOUNTER — Other Ambulatory Visit (HOSPITAL_COMMUNITY): Payer: Self-pay | Admitting: Diagnostic Radiology

## 2020-10-16 ENCOUNTER — Ambulatory Visit (HOSPITAL_COMMUNITY)
Admission: RE | Admit: 2020-10-16 | Discharge: 2020-10-16 | Disposition: A | Discharge status: Home / Self Care | Payer: Medicare Other | Source: Ambulatory Visit | Attending: Diagnostic Radiology | Admitting: Diagnostic Radiology

## 2020-10-16 ENCOUNTER — Telehealth: Payer: Self-pay | Admitting: Gastroenterology

## 2020-10-16 ENCOUNTER — Other Ambulatory Visit: Payer: Self-pay

## 2020-10-16 DIAGNOSIS — Z9049 Acquired absence of other specified parts of digestive tract: Secondary | ICD-10-CM

## 2020-10-16 DIAGNOSIS — Z434 Encounter for attention to other artificial openings of digestive tract: Secondary | ICD-10-CM | POA: Insufficient documentation

## 2020-10-16 HISTORY — PX: IR CHOLANGIOGRAM EXISTING TUBE: IMG6040

## 2020-10-16 MED ORDER — IOHEXOL 300 MG/ML  SOLN
10.0000 mL | Freq: Once | INTRAMUSCULAR | Status: AC | PRN
  Administered 2020-10-16: 5 mL

## 2020-10-16 NOTE — Telephone Encounter (Signed)
The pt has been advised that she will need to speak with IR in regards to her JP drain. She believes the area around the drain is infected. The pt has been advised of the information and verbalized understanding.

## 2020-10-16 NOTE — Telephone Encounter (Signed)
Inbound call from patient stating she saw blood in the stent but was able to flush It out.  She also thinks her incision may be infected; looks red.  Please advise.

## 2020-10-25 ENCOUNTER — Encounter: Payer: Self-pay | Admitting: Nurse Practitioner

## 2020-10-25 ENCOUNTER — Ambulatory Visit (INDEPENDENT_AMBULATORY_CARE_PROVIDER_SITE_OTHER): Payer: Medicare Other | Admitting: Nurse Practitioner

## 2020-10-25 ENCOUNTER — Telehealth: Payer: Self-pay | Admitting: *Deleted

## 2020-10-25 VITALS — BP 90/68 | HR 112 | Ht 65.0 in | Wt 176.0 lb

## 2020-10-25 DIAGNOSIS — D509 Iron deficiency anemia, unspecified: Secondary | ICD-10-CM

## 2020-10-25 DIAGNOSIS — K7460 Unspecified cirrhosis of liver: Secondary | ICD-10-CM

## 2020-10-25 DIAGNOSIS — K5909 Other constipation: Secondary | ICD-10-CM

## 2020-10-25 DIAGNOSIS — K838 Other specified diseases of biliary tract: Secondary | ICD-10-CM | POA: Diagnosis not present

## 2020-10-25 NOTE — Patient Instructions (Signed)
Add 2 stool softeners daily at bedtime.   Hold Iron for 1 week prior to colonoscopy.  You have been scheduled for a colonoscopy. Please follow written instructions given to you at your visit today.  Please pick up your prep supplies at the pharmacy within the next 1-3 days. If you use inhalers (even only as needed), please bring them with you on the day of your procedure.  If you are age 56 or older, your body mass index should be between 23-30. Your Body mass index is 29.29 kg/m. If this is out of the aforementioned range listed, please consider follow up with your Primary Care Provider.  If you are age 73 or younger, your body mass index should be between 19-25. Your Body mass index is 29.29 kg/m. If this is out of the aformentioned range listed, please consider follow up with your Primary Care Provider.   __________________________________________________________  The [place] [facility] providers would like to encourage you to use [facility] to communicate with providers for non-urgent requests or questions.  Due to long hold times on the telephone, sending your provider a message by [facility] may be a faster and more efficient way to get a response.  Please allow 48 business hours for a response.  Please remember that this is for non-urgent requests.

## 2020-10-25 NOTE — Progress Notes (Signed)
Reviewed and agree with management plan for IDA, chronic constipation and cirrhosis. Biliary mgmt decisions per Dr. Rush Landmark.   Pricilla Riffle. Fuller Plan, MD FACG [phone]

## 2020-10-25 NOTE — Progress Notes (Signed)
Attending Physician's Attestation   I have reviewed the chart.   I agree with the Advanced Practitioner's note, impression, and recommendations with any updates as below. This is quite unfortunate.  At this point I have nothing else that I have available here in [place] from an endoscopic approach to trying to stop this bile leak.  She will need to be evaluated by surgery for potential biliary bypass.  In the interim she needs to have an x-ray to see how the bile duct stent and cystic duct stent are sitting in place, I looked at the cholangiogram that suggests that the metal biliary stent is probably fallen out of position once again.  We may need to proceed with repeat ERCP just to remove the covered biliary stent but otherwise will need to see surgery.  I would like the patient to be referred to [facility] surgery Dr. Alexis Goodell. Zenia Resides urgent evaluation.  Also go ahead and asked the patient/husband if they would want to be referred to one of the other big centers and if so which 1 so that we can get them off to hepatobiliary surgery.   Justice Britain, MD [facility] Advanced Endoscopy Office # [phone]

## 2020-10-25 NOTE — Progress Notes (Signed)
ASSESSMENT AND PLAN    # 55 year old female with complex medical history including cirrhosis, chronic postop bile leak following cholecystectomy March 2021 requiring numerous ERCPs for stent placement / exchanges for occluded or migrated stents.  Persistent cystic duct leak on most recent ERCP 09/30/2020 but after additional stenting the completion cholangiogram was negative for persistent leak. However having JP drainage again (approximately 400 mL daily) and cholangiogram on 5/25 showed persistent communication between the residual cystic duct and biliary tree.   --Patient tells me that she and husband spoke with Dr. Rush Landmark following the ERCP and plan was for bile duct reconstruction if leak persisted. She is awaiting a referral. I will check with Dr  Rush Landmark.   # Iron deficiency anemia.  She has received 2 recent IV iron infusions and is on oral.  She had a colonoscopy for bowel changes January 2021 but prep was poor.   Repeat colonoscopy postponed after she suffered post-op bile leak in March 2021.  Given IDA we need to proceed with colonoscopy.  -The risks and benefits of colonoscopy with possible polypectomy / biopsies were discussed and the patient agrees to proceed.  -- 2-day bowel prep  # Chronic constipation, most likely medication related.  She is having a bowel movement every other day but stools are hard requiring her to strain -- Continue daily Linzess to 90 mcg -- Continue daily MiraLAX -- Continue lactulose 3 times daily -- Continue with good hydration -- Add stool softeners, 2 at bedtime  # Presumed cirrhosis, compensated.  Hepatic serologic work-up negative. Presumed NASH cirrhosis. She had acirrhotic appearing liver noted at time of cholecystectomy. Liver biopsy not done --No esophageal varices or portal gastropathy on upper endoscopy in February 2022   -- No focal liver lesions on CT scan with contrast February 2020 today.  She is due for an annual AFP, can  collect that later. --INR normal February 2022  --Continue Lactulose TID. Ammonia elevated in February 2022.  --Hep A, B vaccination ordered at her visit here in March 2021, looks like she didn't receive them. Can check antibiotics at some point    HISTORY OF PRESENT ILLNESS    Chief Complaint : hospital follow up  Kim Hicks is a 55 y.o. female known to Dr. Fuller Plan with a complex medical history significant for cirrhosis, iron deficiency anemia, anxiety, depression, hypertension, DM2, laparoscopic cholecystectomy complicated by postop bile leak March 2021.  See PMH below for any additional medical problems.    Kim Hicks had a postop bile leak following cholecystectomy in March 2021.  She still has a perc drain. She has undergone numerous ERCPs for persistent leak and has has several stent placements / exchanges for clogged stents / migrated stents.  Last ERCP was Sep 30, 2020 and remarkable for 4 partially occluded stents, partly migrated but still in the CBD.  Persistent bile leak from cystic duct, and choledocholithiasis . The 4 stents were removed. A plastic stent was placed in the cystic duct.  Sludge and stone removed from the CBD . A fully covered metal stent was placed in the CBD.  Completion cholangiogram at the end of the study was negative for persistent.  Patient required admission after the procedure for abdominal pain /post ERCP pancreatitis.  She was discharged 10/03/2020, CT scan the day before had shown minimal edema around the head of the pancreas and hepatoduodenal ligament.    [place]elle has chronic anemia, likely multifactorial.  However, she is iron deficient.  Prior to recent hospital discharge she was given a dose of IV iron and then discharged home on oral iron.  She has received a second dose of IV iron this month.  She was made this appointment today to follow-up on recent hospitalization but also so that we can talk about colonoscopy.  She had a colonoscopy in January 2021 for  evaluation of bowel changes.  The prep was poor and plans were to repeat the colonoscopy but that was delayed given postop bile leak.    INTERVAL HISTORY: For the first week post hospital discharge [place] had no drainage in her percutaneous drain collection bag.  However, she has since started draining again and is averaging about 400 mL of cloudy bilious fluid every 24 hours.  Output contained blood last week, patient was sent to IR for evaluation.  Fluoroscopic injection showed that the drain catheter was in stable position.  Contrast communicated with the residual cystic duct and drain in the biliary tree.  Contrast drained through the distal CBD metallic stent into the duodenum.  Biliary system decompressed  Patient tells me that she and her husband spoke with Dr. Rush Landmark after the last ERCP .  If leak persisted then plan was to refer patient to surgery for consideration of bile duct reconstruction.  She says the decision was made not to perform any more ERCP/stent exchanges but wait on surgical evaluation .    Becki has been taking her oral iron.  She complains of constipation.  Though she is having a bowel movement every other day her stools are hard requiring her to strain.  She is on daily MiraLAX, Linzess to 90 daily, lactulose 3 times daily.  She has discontinued sodas and now drinking five 16 ounce bottles of water every day   Past Medical History:  Diagnosis Date  . Anxiety   . Cataract   . Cirrhosis of liver ([place])   . Common migraine with intractable migraine 08/07/2020  . Depression   . Diabetes mellitus without complication ([place])   . Dysrhythmia   . Edema   . Family history of adverse reaction to anesthesia    mother had trouble waking up after surgery  . Foot fracture, left   . Headache    MIGRAINES  . Hyperlipidemia   . Hypertension   . Neuromuscular disorder ([place])    NEUROPATHY-both hands and feet  . On supplemental oxygen by nasal cannula    2L at bedtime only  .  Palpitations   . Wheezing     Current Medications, Allergies, Past Surgical History, Family History and Social History were reviewed in [employer] record.   Current Outpatient Medications  Medication Sig Dispense Refill  . acetaminophen (TYLENOL) 500 MG tablet Take 1,000 mg by mouth every 6 (six) hours as needed for moderate pain.    [name] AIMOVIG 140 MG/ML SOAJ Inject 140 mg into the skin every 30 (thirty) days.    . ALPRAZolam (XANAX) 0.5 MG tablet Take 0.5 mg by mouth 2 (two) times daily as needed for anxiety.    . brexpiprazole (REXULTI) 2 MG TABS tablet Take 2 mg by mouth at bedtime.    . Dulaglutide 4.5 MG/0.5ML SOPN Inject 4.5 mg into the skin every Monday.    . Empagliflozin-metFORMIN HCl ER (SYNJARDY XR) 25-1000 MG TB24 Take 1 tablet by mouth daily before breakfast.     . ezetimibe (ZETIA) 10 MG tablet Take 10 mg by mouth daily.    . ferrous sulfate  325 (65 FE) MG tablet Take 325 mg by mouth daily with breakfast.    . Insulin Glargine (BASAGLAR KWIKPEN) 100 UNIT/ML Inject 40 Units into the skin at bedtime.    . iron polysaccharides (NIFEREX) 150 MG capsule Take 1 capsule (150 mg total) by mouth daily. 30 capsule 1  . lactulose (CHRONULAC) 10 GM/15ML solution TAKE 15 MLS (10 G TOTAL) BY MOUTH 3 (THREE) TIMES DAILY AS NEEDED FOR MILD CONSTIPATION. 900 mL 0  . linaclotide (LINZESS) 290 MCG CAPS capsule Take 1 capsule (290 mcg total) by mouth daily before breakfast. 90 capsule 1  . lisinopril (ZESTRIL) 20 MG tablet Take 20 mg by mouth daily.    . metFORMIN (GLUCOPHAGE) 1000 MG tablet Take 1,000 mg by mouth at bedtime.    . ondansetron (ZOFRAN-ODT) 4 MG disintegrating tablet Take 4 mg by mouth every 8 (eight) hours as needed for nausea or vomiting.    [name] oxyCODONE (ROXICODONE) 5 MG immediate release tablet Take 1 tablet (5 mg total) by mouth every 6 (six) hours as needed for severe pain. 12 tablet 0  . pantoprazole (PROTONIX) 40 MG tablet Take 40 mg by mouth daily.     . prazosin (MINIPRESS) 2 MG capsule Take 4 mg by mouth at bedtime.    . pregabalin (LYRICA) 100 MG capsule One capsule in the morning and midday, 2 in the evening (Patient taking differently: Take 100 mg by mouth See admin instructions. Take 100 mg   in the morning and midday, 200 mg in the evening) 360 capsule 1  . QUEtiapine (SEROQUEL) 400 MG tablet Take 0.5 tablets (200 mg total) by mouth at bedtime. (Patient taking differently: Take 800 mg by mouth at bedtime.)    . QUEtiapine (SEROQUEL) 50 MG tablet Take 100 mg by mouth every morning.    . simvastatin (ZOCOR) 10 MG tablet Take 10 mg by mouth daily.  6  . traMADol ([name]) 50 MG tablet Take 50 mg by mouth 2 (two) times daily.    [name] vortioxetine HBr ([name]) 20 MG TABS tablet Take 20 mg by mouth at bedtime.    . ziprasidone ([name]) 20 MG capsule Take 20 mg by mouth at bedtime.     No current facility-administered medications for this visit.    Review of Systems: No chest pain. No shortness of breath. No urinary complaints.   PHYSICAL EXAM :    Wt Readings from Last 3 Encounters:  10/25/20 176 lb (79.8 kg)  09/30/20 169 lb 15.6 oz (77.1 kg)  08/09/20 169 lb 12.8 oz (77 kg)    BP 90/68   Pulse (!) 112   Ht 5' 5"  (1.651 m)   Wt 176 lb (79.8 kg)   SpO2 99%   BMI 29.29 kg/m  Constitutional:  Pleasant female in no acute distress. Psychiatric: Normal mood and affect. Behavior is normal. EENT: Pupils normal.  Conjunctivae are normal. No scleral icterus. Neck supple.  Cardiovascular: Normal rate, regular rhythm. No edema Pulmonary/chest: Effort normal and breath sounds normal. No wheezing, rales or rhonchi. Abdominal: Soft, nondistended, nontender. Bowel sounds active throughout. There are no masses palpable. No hepatomegaly. Neurological: Alert and oriented to person place and time. Skin: Skin is warm and dry. No rashes noted.  [name], NP  10/25/2020, 9:22 AM

## 2020-10-28 ENCOUNTER — Other Ambulatory Visit: Payer: Self-pay

## 2020-10-28 ENCOUNTER — Telehealth: Payer: Self-pay | Admitting: Gastroenterology

## 2020-10-28 ENCOUNTER — Telehealth: Payer: Self-pay

## 2020-10-28 ENCOUNTER — Ambulatory Visit (INDEPENDENT_AMBULATORY_CARE_PROVIDER_SITE_OTHER)
Admission: RE | Admit: 2020-10-28 | Discharge: 2020-10-28 | Disposition: A | Discharge status: Home / Self Care | Payer: Medicare Other | Source: Ambulatory Visit | Attending: Gastroenterology | Admitting: Gastroenterology

## 2020-10-28 DIAGNOSIS — K838 Other specified diseases of biliary tract: Secondary | ICD-10-CM

## 2020-10-28 NOTE — Telephone Encounter (Signed)
Left message for patient to call office. Who prescribes patient diabetes medication?

## 2020-10-28 NOTE — Telephone Encounter (Signed)
I called and spoke with the patient this afternoon. After discussion with pancreaticobiliary surgery (Dr. Zenia Resides and Dr. Barry Dienes) they feel that next step prior to a biliary bypass would be PBD placement.  This would allow internal/external drainage as well as with my stents in place potentially allowing multiple avenues for drainage rather than the cystic duct/subtotal cholecystectomy remnant. Patient is not sure she wants to have another drainage bag in place but would be happy to meet the surgeons to discuss this. She thinks she would rather do surgery but needs to hear what the risks of a Whipple or biliary bypass are so that she can make a decision. I think the conservative attempt at PBD placement will give the best opportunity to try and see without needing to do surgery whether this can treat this stubborn leak.  I will forward this information to Dr. Zenia Resides and Dr. Barry Dienes to see if they may have an appointment to see and discuss this further with her. She is appreciative for the care that she has she just wants to get better. [name], if you can ensure that there is a referral in place, but I believe you already have one previously.   Kim Britain, MD [facility] Office # [phone]

## 2020-10-28 NOTE — Telephone Encounter (Signed)
-----   Message from [name]., MD sent at 10/25/2020  9:31 PM EDT ----- Kim Hicks, Call the patient/husband when able next week. Get the patient a 2 view KUB to evaluate bile duct stents. Get the patient an urgent referral to Dr. Allen/Dr. Barry Dienes for discussion of biliary bypass for persistent leak. When talking with the patient/husband, if they want to be referred to one of the quatern[facility] centers as well find out which one so that we can send him to the hepatobiliary surgery. Thanks. GM ----- Message ----- From: Willia Craze, NP Sent: 10/25/2020   2:00 PM EDT To: [name], MD, #

## 2020-10-28 NOTE — Telephone Encounter (Signed)
Referral was made this morning with records faxed.

## 2020-10-28 NOTE — Telephone Encounter (Signed)
The pt has been advised and agrees to appt with [facility]. Referral has been made - order for KUB has been entered and pt will come in today for xray.

## 2020-10-29 ENCOUNTER — Other Ambulatory Visit: Payer: Self-pay

## 2020-10-29 ENCOUNTER — Telehealth: Payer: Self-pay

## 2020-10-29 DIAGNOSIS — T85520A Displacement of bile duct prosthesis, initial encounter: Secondary | ICD-10-CM

## 2020-10-29 NOTE — Telephone Encounter (Signed)
-----   Message from [name]., MD sent at 10/28/2020  5:31 PM EDT ----- PG, Patient did not want to have another drain placed without talking with surgery first (I called her this afternoon). Patient will follow-up with surgery to finalize decision about PBD or whether she would want a referral elsewhere from a surgical perspective. Will see the x-ray to determine the stent positioning. Thanks. GM ----- Message ----- From: Willia Craze, NP Sent: 10/28/2020   5:18 PM EDT To: Timothy Lasso, RN, [name]., MD  Looks like  Dr. Barry Dienes doesn't  feel she can help patient and thinks she needs to see IR. I'm trying to follow all the notes / comments on patient. I guess Chong Sicilian is gettng xray to see stent position. Do you think IR intervention next step or another surgical opinion vrs I don't know what else since the leak just will not heal.  Thanks, PG

## 2020-10-29 NOTE — Telephone Encounter (Signed)
[name], FNP prescribes patient's diabetic medications.

## 2020-10-30 ENCOUNTER — Encounter: Payer: Self-pay | Admitting: *Deleted

## 2020-10-31 ENCOUNTER — Telehealth: Payer: Self-pay | Admitting: Gastroenterology

## 2020-10-31 NOTE — Telephone Encounter (Signed)
Faxed request to PCP ask for directions for patient's insulin since she will be on clear liquid diet for 2 full days prior to colonoscopy.

## 2020-10-31 NOTE — Telephone Encounter (Signed)
The pt states that she has a lot of bile leaking and had to empty her bag 3 times in 4 hours yesterday.  She would like a call back to discuss with Dr Rush Landmark .

## 2020-10-31 NOTE — Telephone Encounter (Signed)
Inbound call from pt stating she is having some draining from her stint. Please give her a call. Thank you.

## 2020-11-01 ENCOUNTER — Inpatient Hospital Stay (HOSPITAL_COMMUNITY)
Admission: EM | Admit: 2020-11-01 | Discharge: 2020-11-01 | DRG: 921 | Discharge status: Against Medical Advice | Payer: Medicare Other | Attending: Internal Medicine | Admitting: Internal Medicine

## 2020-11-01 ENCOUNTER — Other Ambulatory Visit: Payer: Self-pay

## 2020-11-01 ENCOUNTER — Encounter (HOSPITAL_COMMUNITY): Payer: Self-pay | Admitting: Emergency Medicine

## 2020-11-01 DIAGNOSIS — E1169 Type 2 diabetes mellitus with other specified complication: Secondary | ICD-10-CM | POA: Diagnosis present

## 2020-11-01 DIAGNOSIS — T85520A Displacement of bile duct prosthesis, initial encounter: Principal | ICD-10-CM | POA: Diagnosis present

## 2020-11-01 DIAGNOSIS — I10 Essential (primary) hypertension: Secondary | ICD-10-CM | POA: Diagnosis present

## 2020-11-01 DIAGNOSIS — Z794 Long term (current) use of insulin: Secondary | ICD-10-CM | POA: Diagnosis not present

## 2020-11-01 DIAGNOSIS — E785 Hyperlipidemia, unspecified: Secondary | ICD-10-CM

## 2020-11-01 DIAGNOSIS — Z885 Allergy status to narcotic agent status: Secondary | ICD-10-CM | POA: Diagnosis not present

## 2020-11-01 DIAGNOSIS — Z79891 Long term (current) use of opiate analgesic: Secondary | ICD-10-CM

## 2020-11-01 DIAGNOSIS — Z90710 Acquired absence of both cervix and uterus: Secondary | ICD-10-CM | POA: Diagnosis not present

## 2020-11-01 DIAGNOSIS — K7460 Unspecified cirrhosis of liver: Secondary | ICD-10-CM | POA: Diagnosis present

## 2020-11-01 DIAGNOSIS — F419 Anxiety disorder, unspecified: Secondary | ICD-10-CM | POA: Diagnosis present

## 2020-11-01 DIAGNOSIS — Z9049 Acquired absence of other specified parts of digestive tract: Secondary | ICD-10-CM

## 2020-11-01 DIAGNOSIS — Z20822 Contact with and (suspected) exposure to covid-19: Secondary | ICD-10-CM | POA: Diagnosis present

## 2020-11-01 DIAGNOSIS — K219 Gastro-esophageal reflux disease without esophagitis: Secondary | ICD-10-CM

## 2020-11-01 DIAGNOSIS — K839 Disease of biliary tract, unspecified: Secondary | ICD-10-CM

## 2020-11-01 DIAGNOSIS — E1140 Type 2 diabetes mellitus with diabetic neuropathy, unspecified: Secondary | ICD-10-CM | POA: Diagnosis present

## 2020-11-01 DIAGNOSIS — Z888 Allergy status to other drugs, medicaments and biological substances status: Secondary | ICD-10-CM

## 2020-11-01 DIAGNOSIS — Y732 Prosthetic and other implants, materials and accessory gastroenterology and urology devices associated with adverse incidents: Secondary | ICD-10-CM | POA: Diagnosis present

## 2020-11-01 DIAGNOSIS — Z7984 Long term (current) use of oral hypoglycemic drugs: Secondary | ICD-10-CM | POA: Diagnosis not present

## 2020-11-01 DIAGNOSIS — Z79899 Other long term (current) drug therapy: Secondary | ICD-10-CM

## 2020-11-01 DIAGNOSIS — E119 Type 2 diabetes mellitus without complications: Secondary | ICD-10-CM | POA: Diagnosis not present

## 2020-11-01 DIAGNOSIS — F32A Depression, unspecified: Secondary | ICD-10-CM | POA: Diagnosis present

## 2020-11-01 DIAGNOSIS — Z818 Family history of other mental and behavioral disorders: Secondary | ICD-10-CM

## 2020-11-01 DIAGNOSIS — Z90721 Acquired absence of ovaries, unilateral: Secondary | ICD-10-CM | POA: Diagnosis not present

## 2020-11-01 DIAGNOSIS — Z833 Family history of diabetes mellitus: Secondary | ICD-10-CM | POA: Diagnosis not present

## 2020-11-01 DIAGNOSIS — K7581 Nonalcoholic steatohepatitis (NASH): Secondary | ICD-10-CM | POA: Diagnosis present

## 2020-11-01 LAB — COMPREHENSIVE METABOLIC PANEL
ALT: 38 U/L (ref 0–44)
AST: 48 U/L — ABNORMAL HIGH (ref 15–41)
Albumin: 4.3 g/dL (ref 3.5–5.0)
Alkaline Phosphatase: 203 U/L — ABNORMAL HIGH (ref 38–126)
Anion gap: 9 (ref 5–15)
BUN: 30 mg/dL — ABNORMAL HIGH (ref 6–20)
CO2: 23 mmol/L (ref 22–32)
Calcium: 9.4 mg/dL (ref 8.9–10.3)
Chloride: 108 mmol/L (ref 98–111)
Creatinine, Ser: 0.72 mg/dL (ref 0.44–1.00)
GFR, Estimated: >60 mL/min (ref >60)
Glucose, Bld: 204 mg/dL — ABNORMAL HIGH (ref 70–99)
Potassium: 5.1 mmol/L (ref 3.5–5.1)
Sodium: 140 mmol/L (ref 135–145)
Total Bilirubin: 0.2 mg/dL — ABNORMAL LOW (ref 0.3–1.2)
Total Protein: 8 g/dL (ref 6.5–8.1)

## 2020-11-01 LAB — URINALYSIS, ROUTINE W REFLEX MICROSCOPIC
Bilirubin Urine: NEGATIVE
Glucose, UA: >=500 mg/dL — AB
Hgb urine dipstick: NEGATIVE
Ketones, ur: NEGATIVE mg/dL
Nitrite: NEGATIVE
Protein, ur: NEGATIVE mg/dL
Specific Gravity, Urine: 1.018 (ref 1.005–1.030)
pH: 5 (ref 5.0–8.0)

## 2020-11-01 LAB — CBC
HCT: 38.3 % (ref 36.0–46.0)
Hemoglobin: 11.5 g/dL — ABNORMAL LOW (ref 12.0–15.0)
MCH: 27.1 pg (ref 26.0–34.0)
MCHC: 30 g/dL (ref 30.0–36.0)
MCV: 90.1 fL (ref 80.0–100.0)
Platelets: 136 10*3/uL — ABNORMAL LOW (ref 150–400)
RBC: 4.25 MIL/uL (ref 3.87–5.11)
RDW: 19.4 % — ABNORMAL HIGH (ref 11.5–15.5)
WBC: 6.7 10*3/uL (ref 4.0–10.5)
nRBC: 0 % (ref 0.0–0.2)

## 2020-11-01 LAB — LIPASE, BLOOD: Lipase: 37 U/L (ref 11–51)

## 2020-11-01 LAB — RESP PANEL BY RT-PCR (FLU A&B, COVID) ARPGX2
Influenza A by PCR: NEGATIVE
Influenza B by PCR: NEGATIVE
SARS Coronavirus 2 by RT PCR: NEGATIVE

## 2020-11-01 LAB — I-STAT BETA HCG BLOOD, ED (MC, WL, AP ONLY): I-stat hCG, quantitative: <5 m[IU]/mL (ref <5)

## 2020-11-01 MED ORDER — LACTATED RINGERS IV SOLN: INTRAVENOUS | Status: DC

## 2020-11-01 MED ORDER — ACETAMINOPHEN 650 MG RE SUPP: 650.0000 mg | Freq: Four times a day (QID) | RECTAL | Status: DC | PRN

## 2020-11-01 MED ORDER — ACETAMINOPHEN 325 MG PO TABS: 650.0000 mg | ORAL_TABLET | Freq: Four times a day (QID) | ORAL | Status: DC | PRN

## 2020-11-01 NOTE — Telephone Encounter (Signed)
This seems quite substantial. When you call her today, if it remains at this significant of a level, I would recommend that she end up coming into the hospital because she likely will need to proceed with percutaneous biliary drain placement as next means of trying to help her. I know she was hesitant about wanting a second drain but this is a huge amount of output.  She may need to have another ERCP to try to temporize things. Let me know what you hear. Thanks. GM   FYI MS.

## 2020-11-01 NOTE — Telephone Encounter (Signed)
Patient called states she was advised to go to the hospital and she is there now but there is a long wait. Requesting to speak with you because she prefers not to wait all day all night.

## 2020-11-01 NOTE — Telephone Encounter (Addendum)
The pt was called this morning and she states that her output is the same maybe even more at this time.  She has been advised to go to the ED to possibly have second drain placed.  She is hesitant but will go as recommended.  FYI Dr Rush Landmark

## 2020-11-01 NOTE — Telephone Encounter (Signed)
[name], Looking through the notes the patient is in the ED and vital signs have been obtained so hopefully should be seen soon. GM

## 2020-11-01 NOTE — Discharge Summary (Signed)
Physician Discharge Summary  Kim Hicks MHD:[id number] DOB: [birth year] DOA: 11/01/2020  PCP: [name], FNP  Admit date: 11/01/2020 Discharge date: patient left AMA on 11/01/20    Admitted From: home Disposition: (patient left AMA on the day of admission)    Recommendations for Outpatient Follow-up:  Patient left AMA admission.  She has existing outpatient follow-up appointment with low-power gastroenterology on 11/05/2020.    Discharge Condition:guarded (patient left AMA).  CODE STATUS:full  Diet recommendation: Recommend the patient remain hospitalized to an active plan relating to progressive biliary drain leakage, including plan for pursuing placement of now percutaneous biliary drain, with preceding n.p.o. status   Brief/Interim Summary:   Of note, the patient left the hospital AMA on the day of admission, before she had moved to her room from the emergency department.  The patient was counseled and's of staying in the hospital for this admission order to pursue recommendations for outpatient gastroenterologist, Dr.Mansouraty, Including placement of new percutaneous biliary drain as well as expediting general surgery consultation for consideration of biliary bypass given recent escalation in volume of output via her biliary leak in the setting of existing percutaneous biliary drain.  The risks of leaving the hospital AMA prior to inaction of this plan, were discussed with the patient including further progression of percutaneous biliary drain leakage, as well as ensuing development of infection, sepsis, as well as increased risk for death.  The patient, who was alert and oriented and expressed her understanding the above indications for hospital to pursue further evaluation management of presenting percutaneous biliary drain leakage, verbalized her understanding of the risks associated with leaving AMA from this hospitalization.  She subsequently hospital AMA in spite of  recommendations conveyed to the patient to stay in the hospital for pursuit of further evaluation and management of presenting worsening percutaneous biliary leakage, as further outlined below:  Kim Hicks is a 55 y.o. female with medical history significant for complicated partial cholecystectomy in March 2021 with ensuing urinary leak status post percutaneous biliary drain Neri stents, type 2 diabetes mellitus, hypertension, hyperlipidemia, who was admitted to [facility] on 11/01/2020 for further evaluation and management of progressive biliary drain leakage.   The patient reportedly underwent partial cholecystectomy in March 2021 with ensuing recurrent biliary leak status post percutaneous biliary drain as well as recurrent placement of biliary stents in the setting of 6 prior ERCPs, with most recent such ERCP occurring in May 2022.  And following with her outpatient gastroenterologist, Dr. Rush Landmark, Recent outpatient imaging demonstrated migration of her metal biliary duct stent to the level of the rectum leaving only a plastic cystic duct stent in place.  Additionally, patient reports 1 week of output via her percutaneous biliary drain noting daily output of 200 to 800 cc of brown-colored fluid relative to her baseline daily output of less than 50 cc leading up to the last 1 week.  She reports mild increase in right upper quadrant abdominal discomfort over the course of the last week, but denies associated diarrhea, melena, or hematochezia.  She also denies any recent nausea or vomiting.  Denies subjective fever, chills, rigors, or generalized myalgias.  Not associate with any recent chest pain, shortness of breath, palpitations, diaphoresis, dizziness, presyncope, or syncope.  Denies any recent worsening of edema in the lower extremities, orthopnea, or PND.  Not associate with any recent dysuria, gross hematuria, or change in urinary urgency/frequency.  Recent headache, neck stiffness, sore  throat, cough, rash.  No known recent COVID-19  exposures.  Not on any blood thinners at home, including no aspirin.   In the setting of the above, the patient has been closely following with Dr.Mansouraty, as her outpatient gastroenterologist.  Per chart review, given this progression of output via her existing percutaneous biliary drain as well as migration of her metal biliary duct stent in spite of 6 prior ERCPs, Dr.MansouratyHas been discussing with the patient the potential for placement of an additional percutaneous biliary drain as an intermediate step towards potential ultimate biliary bypass.  In taking steps interventions, Dr.MansouratyHas arranged for patient general surgery consultation to assess potential biliary bypass.  Specifically the patient confirms that she has an outpatient general surgery consultation for this purpose scheduled with Dr. Zenia Resides in July, with the patient recommend this appointment specifically set to occur on July 11.  However, given recent acceleration of output via her existing percutaneous biliary drain, Dr.MansouratyInstructed the patient to present to [facility] emergency department for admission to help expedite potential placement of a new percutaneous biliary drain as well as to expedite general surgery consultation for consideration of biliary bypass.   From the time admission to the hospital at [facility] on 11/01/2020 until the patient the left AMA a few hours later before arriving in her hospital room, Hospital course unfolded as follows:     #) Biliary drain leak: In the setting of a history of complicated partial cholecystectomy in March 2021 resulting in ensuing placement of percutaneous biliary drain as well as 6 ERCPs associated biliary stent placement and ensuing migration of metal biliary duct stent to the level of the rectum leaving only plastic cystic duct stent in place, as further described above, the patient presented with 1 week of increase in  volume of output via the percutaneous biliary drain prompting her outpatient gastroenterologist, Dr. Rush Landmark, to recommend that she present to the emergency department for admission for expediting consideration of placement of new percutaneous biliary drain placement as well as for expediting general surgery consultation for potential biliary bypass.  She was made NPO in anticipation of the above surgical intervention, with plan for interventional radiology consultation for potential placement of new percutaneous biliary drain in addition to plan for inpatient general surgery consultation for evaluation of potential biliary bypass. However, the patient subsequently the hospital AMA within a few hours initial admission to anticipated consultations occurring.  As needed IV analgesia for residual right upper quadrant abdominal pain offered to the patient, but subsequently refused prior below the patient did not require any doses of sertraline prior to leaving AMA.  We refrain from pharmacologic DVT prophylaxis and pursue SCDs with anticipation of the above surgical consultations.  She was started on gentle maintenance IV fluids while n.p.o. prior to leaving the hospital AMA, the indications for remaining in the hospital in the setting of acceleration of her biliary drain leakage output reviewed with the patient as well as review of the potential risks of leaving the hospital AMA, following which the patient elected to leave AMA to infection of the above outlined plan, as further detailed above.         #) Type 2 diabetes mellitus: The patient insulin regimen consists of Lantus 30 units subcu nightly.  Additionally, her oral hypoglycemic regimen as an outpatient consists of metformin as well as empagliflozin.  Her presenting blood sugar per presenting CMP was noted to be 204.  Plan was to  hold home oral hypoglycemic agents during this hospitalization, and resume portion of her home basal insulin,  with plan for  Lantus 10 units subcu QHS.  Additionally, glycemic management included the plan for every 6 hours Accu-Cheks with low-dose sliding scale insulin.  However, the patient left the hospital AMA prior to receiving any subcutaneous insulin, either basal or short acting.        #) Hyperlipidemia: On Zetia as well as simvastatin as an outpatient.  In the setting of n.p.o. status and pursuit of surgical intervention for presenting worsening biliary drain leak, the initial plan was to hold the patient's home simvastatin and zetia.            #) Essential hypertension: Outpatient antihypertensive regimen consists of lisinopril.  systolic blood pressures have noted to be normotensive in the ED without evidence of hypotension. In the setting of n.p.o. status, as lined above, the patient's home lisinopril was held, orders for close monitoring of ensuing blood pressure via routine vital signs were enacted.          Discharge Diagnoses:  Principal Problem:   Biliary drain displacement Active Problems:   Diabetes mellitus without complication (HCC)   Hyperlipidemia associated with type 2 diabetes mellitus ([place])   Hypertension   GERD   Discharge Instructions Patient not formally discharge, but rather left the hospital AMA within a few hours of admission.  Given that the patient was leaving AMA in spite of multiple conversations encouraging the patient to stay to pursue further evaluation management of progressive biliary drain leak, the patient was encouraged to closely follow-up with her gastroenterologist, while emphasizing to enact gastroenterology plan, as above.     Allergies  Allergen Reactions   Dilaudid [Hydromorphone] Hives   Talwin [Pentazocine]     Turned blue around lips and face, rash all over body    Consultations: The patient left the hospital AMA prior to anticipated formal consultations of interventional radiology and general surgery, as further detailed  above.   Procedures/Studies: DG Abd 2 Views  Result Date: 10/29/2020 CLINICAL DATA:  History of bile leak, follow-up biliary stent EXAM: ABDOMEN - 2 VIEW COMPARISON:  10/16/2020 FINDINGS: Scattered large and small bowel gas is noted. Fecal material is noted throughout the colon. Plastic stent is noted within the common bile duct in satisfactory position. This is stable from the prior injection film. The previously placed metal wall stent however has migrated into the rectum. Changes of prior tubal ligation are noted. Gallbladder abscess drain catheter is again identified and stable. IMPRESSION: Interval migration of previously placed metallic stent into the rectum. Plastic stent remains in place. Right upper quadrant abscess drainage catheter is again noted and stable. Electronically Signed   By: Inez Catalina M.D.   On: 10/29/2020 12:17   IR CHOLANGIOGRAM EXISTING TUBE  Result Date: 10/16/2020 INDICATION: Gallbladder fossa drain, bloody output, right upper quadrant pain EXAM: FLUOROSCOPIC INJECTION OF THE GALLBLADDER FOSSA ABSCESS DRAIN MEDICATIONS: NONE. ANESTHESIA/SEDATION: Moderate Sedation Time:  None. The patient was continuously monitored during the procedure by the interventional radiology nurse under my direct supervision. COMPLICATIONS: None immediate. PROCEDURE: Informed written consent was obtained from the patient after a thorough discussion of the procedural risks, benefits and alternatives. All questions were addressed. Maximal Sterile Barrier Technique was utilized including caps, mask, sterile gowns, sterile gloves, sterile drape, hand hygiene and skin antiseptic. A timeout was performed prior to the initiation of the procedure. Under sterile conditions, the existing gallbladder fossa abscess drain was injected with contrast. Fluoroscopic imaging performed. The drain catheter is stable in position when compared to the CT of  10/01/2020. Pigtail remains within the gallbladder fossa. Adjacent  cholecystectomy clips noted. There is a residual abscess cavity which is collapsed. Contrast does communicate with the residual cystic duct and drain into the biliary tree. Contrast visually drains through the distal CBD metallic stent into the duodenum. Biliary system is decompressed. IMPRESSION: Stable drain catheter position within the gallbladder fossa. Collapsed gallbladder fossa abscess cavity. Persistent communication with the residual patent cystic duct and biliary tree. Contrast drains through the distal CBD metallic stent into the duodenum. Electronically Signed   By: Jerilynn Mages.  Shick M.D.   On: 10/16/2020 15:59   (Echo, Carotid, EGD, Colonoscopy, ERCP)    Subjective:   Discharge Exam: Vitals:   11/01/20 2030 11/01/20 2130  BP: 129/77 116/62  Pulse: 87 84  Resp: 17 17  Temp:    SpO2: 100% 100%   Vitals:   11/01/20 1854 11/01/20 1957 11/01/20 2030 11/01/20 2130  BP: 108/69 109/62 129/77 116/62  Pulse: 65 88 87 84  Resp: 16 18 17 17   Temp: 98.4 F (36.9 C)     TempSrc: Oral     SpO2: 97% 100% 100% 100%  Weight:      Height:           The results of significant diagnostics from this hospitalization (including imaging, microbiology, ancillary and laboratory) are listed below for reference.     Microbiology: No results found for this or any previous visit (from the past 240 hour(s)).   Labs: BNP (last 3 results) No results for input(s): BNP in the last 8760 hours. Basic Metabolic Panel: Recent Labs  Lab 11/01/20 1326  NA 140  K 5.1  CL 108  CO2 23  GLUCOSE 204*  BUN 30*  CREATININE 0.72  CALCIUM 9.4   Liver Function Tests: Recent Labs  Lab 11/01/20 1326  AST 48*  ALT 38  ALKPHOS 203*  BILITOT 0.2*  PROT 8.0  ALBUMIN 4.3   Recent Labs  Lab 11/01/20 1326  LIPASE 37   No results for input(s): AMMONIA in the last 168 hours. CBC: Recent Labs  Lab 11/01/20 1326  WBC 6.7  HGB 11.5*  HCT 38.3  MCV 90.1  PLT 136*   Cardiac Enzymes: No results for  input(s): CKTOTAL, CKMB, CKMBINDEX, TROPONINI in the last 168 hours. BNP: Invalid input(s): POCBNP CBG: No results for input(s): GLUCAP in the last 168 hours. D-Dimer No results for input(s): DDIMER in the last 72 hours. Hgb A1c No results for input(s): HGBA1C in the last 72 hours. Lipid Profile No results for input(s): CHOL, HDL, LDLCALC, TRIG, CHOLHDL, LDLDIRECT in the last 72 hours. Thyroid function studies No results for input(s): TSH, T4TOTAL, T3FREE, THYROIDAB in the last 72 hours.  Invalid input(s): FREET3 Anemia work up No results for input(s): VITAMINB12, FOLATE, FERRITIN, TIBC, IRON, RETICCTPCT in the last 72 hours. Urinalysis    Component Value Date/Time   COLORURINE YELLOW 11/01/2020 2125   APPEARANCEUR CLEAR 11/01/2020 2125   LABSPEC 1.018 11/01/2020 2125   PHURINE 5.0 11/01/2020 2125   GLUCOSEU >=500 (A) 11/01/2020 2125   HGBUR NEGATIVE 11/01/2020 2125   BILIRUBINUR NEGATIVE 11/01/2020 2125   [place] NEGATIVE 11/01/2020 2125   PROTEINUR NEGATIVE 11/01/2020 2125   NITRITE NEGATIVE 11/01/2020 2125   LEUKOCYTESUR MODERATE (A) 11/01/2020 2125   Sepsis Labs Invalid input(s): PROCALCITONIN,  WBC,  LACTICIDVEN Microbiology No results found for this or any previous visit (from the past 240 hour(s)).   Time coordinating discharge: under 30 minutes  SIGNED:   Larkin Ina  B Gagandeep Kossman, DO  [facility] 11/01/2020, 11:06 PM Pager

## 2020-11-01 NOTE — ED Notes (Signed)
Was unlate to get patient vitals earlier because she was in the restroom

## 2020-11-01 NOTE — ED Provider Notes (Signed)
Kim Hicks Provider Note   CSN: [id number] Arrival date & time: 11/01/20  1255     History Chief Complaint  Patient presents with   Abdominal Pain    Kim Hicks is a 55 y.o. female with hx of cirrhosis, DM, HTN, HLD presents with biliary drainage in her percutaneous drain. In March 2021 had laparoscopic cholecystectomy which was technically difficult, the back wall of the gallbladder was left in place. She has had drain in the gallbaldder fossa since that time, changed regularly by IR. This has intermittently drained bile since that time, and has had numerous ERCP for placement of stents in the biliary system which have unfortunately all failed. Most recently on 09/30/20 with placement of a plastic and a fully covered metal stent. Patient has had recurrence of biliary drainage this past week, and abdominal x-ray on 6/6 demonstrated that the metal stent had migrated into the rectum. Plastic stent still in place. She endorses fairly copious drainage, estimated 800cc since arrival at the ED roughly 8 hours ago. Also complains of mild generalized abdominal pain and distention. Also has associated nausea but no vomiting, has had normal PO intake. Otherwise has been in her usual state of health.  Patient has been in contact with her gastroenterologist, Dr. Rush Landmark, who recommended she come to the ED and be evaluated by urgery for potential percutaneous biliary drain vs biliary bypass. He documented that she may also need repeat ERCP to replace stents while determining ultimate therapy.   Past Medical History:  Diagnosis Date   Anxiety    Cataract    Cirrhosis of liver ([place])    Common migraine with intractable migraine 08/07/2020   Depression    Diabetes mellitus without complication ([place])    Dysrhythmia    Edema    Family history of adverse reaction to anesthesia    mother had trouble waking up after surgery   Foot fracture, left    Headache     MIGRAINES   Hyperlipidemia    Hypertension    Neuromuscular disorder ([place])    NEUROPATHY-both hands and feet   On supplemental oxygen by nasal cannula    2L at bedtime only   Palpitations    Wheezing     Patient Active Problem List   Diagnosis Date Noted   Iron deficiency anemia 10/03/2020   Common migraine with intractable migraine 90/24/0973   Metabolic encephalopathy    Septic shock ([place]) 07/06/2020   Acute respiratory failure with hypoxemia ([place]) 07/06/2020   Pressure injury of skin 07/04/2020   Post-ERCP acute pancreatitis 06/27/2020   Pancreatitis 06/27/2020   Abscess of gallbladder    Migration of biliary stent    Malnutrition of moderate degree 05/23/2020   Abdominal pain 05/21/2020   Biloma 05/20/2020   Biliary sludge    Complicated UTI (urinary tract infection) 03/11/2020   Encounter for removal of biliary stent    Diabetic peripheral neuropathy ([place]) 09/14/2019   Gait disturbance 09/14/2019   NASH (nonalcoholic steatohepatitis) 08/21/2019   Bile leak    Other cirrhosis of liver ([place]) 08/04/2019   Major depressive disorder, recurrent episode, severe, with psychotic behavior ([place]) 03/25/2011   DYSPHAGIA UNSPECIFIED 03/04/2009   GERD 02/18/2009   PERSISTENT VOMITING 02/18/2009   CHEST PAIN 02/18/2009   NAUSEA AND VOMITING 02/18/2009   Dehydration 07/19/2007   GASTROENTERITIS 07/19/2007   FIBROIDS, UTERUS 05/02/2007   Depression 05/02/2007   MIGRAINE HEADACHE 05/02/2007   Hypertension 05/02/2007   HEMORRHOIDS 05/02/2007  Chronic constipation 05/02/2007   IBS 05/02/2007   PSORIASIS 05/02/2007   PITUITARY NEOPLASM, HX OF 05/02/2007   LIVER FUNCTION TESTS, ABNORMAL, HX OF 05/02/2007   Diabetes mellitus without complication ([place]) 34/74/2595   Hyperlipidemia associated with type 2 diabetes mellitus ([place]) 02/04/2007   SINUSITIS, ACUTE 02/04/2007   Abdominal pain, right upper quadrant 02/04/2007   Hyperlipidemia 02/04/2007    Past Surgical History:   Procedure Laterality Date   ABDOMINAL HYSTERECTOMY     one ovary left   BILIARY STENT PLACEMENT N/A 08/07/2019   Procedure: BILIARY STENT PLACEMENT;  Surgeon: [name], MD;  [facility]: [facility];  Service: Endoscopy;  Laterality: N/A;   BILIARY STENT PLACEMENT N/A 11/07/2019   Procedure: BILIARY STENT PLACEMENT;  Surgeon: [name], MD;  [facility];  Service: Endoscopy;  Laterality: N/A;   BILIARY STENT PLACEMENT N/A 05/13/2020   Procedure: BILIARY STENT PLACEMENT;  Surgeon: [name], MD;  [facility];  Service: Endoscopy;  Laterality: N/A;   BILIARY STENT PLACEMENT N/A 05/27/2020   Procedure: BILIARY STENT PLACEMENT;  Surgeon: [name], MD;  [facility];  Service: Endoscopy;  Laterality: N/A;   BILIARY STENT PLACEMENT N/A 06/26/2020   Procedure: BILIARY STENT PLACEMENT;  Surgeon: [name], MD;  [facility];  Service: Gastroenterology;  Laterality: N/A;   BILIARY STENT PLACEMENT N/A 09/30/2020   Procedure: BILIARY STENT PLACEMENT;  Surgeon: [name], MD;  [facility];  Service: Gastroenterology;  Laterality: N/A;   BIOPSY  06/26/2020   Procedure: BIOPSY;  Surgeon: [name], MD;  Location: [facility];  Service: Gastroenterology;;   CATARACT EXTRACTION W/PHACO Right 01/11/2018   Procedure: CATARACT EXTRACTION PHACO AND INTRAOCULAR LENS PLACEMENT ([place]);  Surgeon: [name], MD;  Location: [facility];  Service: Ophthalmology;  Laterality: Right;  [place] 00:57.5 AP% 15.0 CDE 8.61 Fluid Pack Lot # [id number]   CATARACT EXTRACTION W/PHACO Left 02/15/2018   Procedure: CATARACT EXTRACTION PHACO AND INTRAOCULAR LENS PLACEMENT ([place]);  Surgeon: [name], MD;  Location: [facility];  Service: Ophthalmology;  Laterality: Left;  [place] 00:51 AP% 13.2 CDE 6.82 Fluid p ack lot # [id number]   CHOLECYSTECTOMY N/A 08/04/2019   Procedure: LAPAROSCOPIC CHOLECYSTECTOMY;  Surgeon: [name], MD;  [facility]: [facility];  Service: General;  Laterality: N/A;   COLONOSCOPY     ENDOSCOPIC RETROGRADE CHOLANGIOPANCREATOGRAPHY (ERCP) WITH PROPOFOL N/A 11/07/2019   Procedure: ENDOSCOPIC RETROGRADE CHOLANGIOPANCREATOGRAPHY (ERCP) WITH PROPOFOL;  Surgeon: [name], MD;  [facility];  Service: Endoscopy;  Laterality: N/A;   ENDOSCOPIC RETROGRADE CHOLANGIOPANCREATOGRAPHY (ERCP) WITH PROPOFOL N/A 05/13/2020   Procedure: ENDOSCOPIC RETROGRADE CHOLANGIOPANCREATOGRAPHY (ERCP) WITH PROPOFOL;  Surgeon: [name], MD;  [facility];  Service: Endoscopy;  Laterality: N/A;   ENDOSCOPIC RETROGRADE CHOLANGIOPANCREATOGRAPHY (ERCP) WITH PROPOFOL N/A 06/26/2020   Procedure: ENDOSCOPIC RETROGRADE CHOLANGIOPANCREATOGRAPHY (ERCP) WITH PROPOFOL;  Surgeon: [name], MD;  [facility];  Service: Gastroenterology;  Laterality: N/A;   ENDOSCOPIC RETROGRADE CHOLANGIOPANCREATOGRAPHY (ERCP) WITH PROPOFOL N/A 09/30/2020   Procedure: ENDOSCOPIC RETROGRADE CHOLANGIOPANCREATOGRAPHY (ERCP) WITH PROPOFOL;  Surgeon: [name], MD;  [facility];  Service: Gastroenterology;  Laterality: N/A;   ERCP N/A 08/07/2019   Procedure: ENDOSCOPIC RETROGRADE CHOLANGIOPANCREATOGRAPHY (ERCP);  Surgeon: [name], MD;  Location: [facility];  Service: Endoscopy;  Laterality: N/A;   ERCP N/A 05/27/2020   Procedure: ENDOSCOPIC RETROGRADE CHOLANGIOPANCREATOGRAPHY (ERCP);  Surgeon: [name], MD;  Location: [facility];  Service: Endoscopy;  Laterality: N/A;  ESOPHAGOGASTRODUODENOSCOPY N/A 06/26/2020   Procedure: ESOPHAGOGASTRODUODENOSCOPY (EGD);  Surgeon: [name]., MD;  Location: [facility];  Service: Gastroenterology;  Laterality: N/A;   IR CHOLANGIOGRAM EXISTING TUBE  10/16/2020   IR EXCHANGE BILIARY DRAIN  07/10/2020   IR EXCHANGE BILIARY DRAIN  08/28/2020   IR RADIOLOGIST EVAL & MGMT  06/20/2020   RCR Bilateral    REMOVAL OF STONES   08/07/2019   Procedure: REMOVAL OF STONES;  Surgeon: [name], MD;  [facility];  Service: Endoscopy;;  balloon sweep, no stones   REMOVAL OF STONES  05/27/2020   Procedure: REMOVAL OF STONES;  Surgeon: [name], MD;  [facility];  Service: Endoscopy;;   REMOVAL OF STONES  06/26/2020   Procedure: REMOVAL OF STONES;  Surgeon: [name]., MD;  Location: [facility];  Service: Gastroenterology;;   REMOVAL OF STONES  09/30/2020   Procedure: REMOVAL OF STONES;  Surgeon: [name]., MD;  Location: [facility];  Service: Gastroenterology;;   [name]  08/07/2019   Procedure: SPHINCTEROTOMY;  Surgeon: [name], MD;  [facility];  Service: Endoscopy;;   STENT REMOVAL  11/07/2019   Procedure: STENT REMOVAL;  Surgeon: [name], MD;  [facility];  Service: Endoscopy;;   STENT REMOVAL  05/13/2020   Procedure: STENT REMOVAL;  Surgeon: [name], MD;  [facility];  Service: Endoscopy;;   STENT REMOVAL  05/27/2020   Procedure: STENT REMOVAL;  Surgeon: [name], MD;  [facility];  Service: Endoscopy;;   STENT REMOVAL  06/26/2020   Procedure: STENT REMOVAL;  Surgeon: [name]., MD;  Location: [facility];  Service: Gastroenterology;;   Lavell Islam REMOVAL  09/30/2020   Procedure: STENT REMOVAL;  Surgeon: [name]., MD;  [facility];  Service: Gastroenterology;;     OB History   No obstetric history on file.     Family History  Problem Relation Age of Onset   Diabetes Mother    Heart failure Mother    Depression Mother    Colon cancer Neg Hx    Stomach cancer Neg Hx    Esophageal cancer Neg Hx    Pancreatic cancer Neg Hx     Social History   Tobacco Use   Smoking status: Never   Smokeless tobacco: Never  Vaping Use   Vaping Use: Never used  Substance Use Topics   Alcohol use: Never   Drug use: Never    Home  Medications Prior to Admission medications   Medication Sig Start Date End Date Taking? Authorizing Provider  acetaminophen (TYLENOL) 500 MG tablet Take 1,000 mg by mouth every 6 (six) hours as needed for moderate pain.    [provider]  AIMOVIG 140 MG/ML SOAJ Inject 140 mg into the skin every 30 (thirty) days. 09/10/20   [provider]  ALPRAZolam Duanne Moron) 0.5 MG tablet Take 0.5 mg by mouth 2 (two) times daily as needed for anxiety. 08/30/20   [provider]  brexpiprazole (REXULTI) 2 MG TABS tablet Take 2 mg by mouth at bedtime.    [provider]  Dulaglutide 4.5 MG/0.5ML SOPN Inject 4.5 mg into the skin every Monday. 12/20/18   [provider]  Empagliflozin-metFORMIN HCl ER (SYNJARDY XR) 25-1000 MG TB24 Take 1 tablet by mouth daily before breakfast.  11/15/18   [provider]  ezetimibe (ZETIA) 10 MG tablet Take 10 mg by mouth daily. 04/25/20   [provider]  ferrous sulfate 325 (65 FE) MG tablet Take 325 mg by mouth daily with breakfast.    [provider]  Insulin Glargine (BASAGLAR KWIKPEN) 100 UNIT/ML Inject 40 Units into the skin at bedtime. 03/20/20   [provider]  iron polysaccharides (NIFEREX) 150 MG capsule Take 1 capsule (150 mg total) by mouth daily. 10/04/20   Eugenie Filler, MD  lactulose (CHRONULAC) 10 GM/15ML solution TAKE 15 MLS (10 G TOTAL) BY MOUTH 3 (THREE) TIMES DAILY AS NEEDED FOR MILD CONSTIPATION. 10/01/20   [name], MD  linaclotide Rolan Lipa) 290 MCG CAPS capsule Take 1 capsule (290 mcg total) by mouth daily before breakfast. 08/30/20   [name], MD  lisinopril (ZESTRIL) 20 MG tablet Take 20 mg by mouth daily.    [provider]  metFORMIN (GLUCOPHAGE) 1000 MG tablet Take 1,000 mg by mouth at bedtime. 02/27/20   [provider]  ondansetron (ZOFRAN-ODT) 4 MG disintegrating tablet Take 4 mg by mouth every 8 (eight) hours as needed for nausea or vomiting.     [provider]  oxyCODONE (ROXICODONE) 5 MG immediate release tablet Take 1 tablet (5 mg total) by mouth every 6 (six) hours as needed for severe pain. 09/30/20   Mansouraty, Telford Nab., MD  pantoprazole (PROTONIX) 40 MG tablet Take 40 mg by mouth daily. 08/19/20   [provider]  prazosin (MINIPRESS) 2 MG capsule Take 4 mg by mouth at bedtime.    [provider]  pregabalin (LYRICA) 100 MG capsule One capsule in the morning and midday, 2 in the evening Patient taking differently: Take 100 mg by mouth See admin instructions. Take 100 mg   in the morning and midday, 200 mg in the evening 08/07/20   Kathrynn Ducking, MD  QUEtiapine (SEROQUEL) 400 MG tablet Take 0.5 tablets (200 mg total) by mouth at bedtime. Patient taking differently: Take 800 mg by mouth at bedtime. 07/13/20   [place] Eric J, DO  QUEtiapine (SEROQUEL) 50 MG tablet Take 100 mg by mouth every morning. 08/30/20   [provider]  simvastatin (ZOCOR) 10 MG tablet Take 10 mg by mouth daily. 12/13/17   [provider]  traMADol ([name]) 50 MG tablet Take 50 mg by mouth 2 (two) times daily. 08/24/20   [provider]  vortioxetine HBr ([name]) 20 MG TABS tablet Take 20 mg by mouth at bedtime.    [provider]  ziprasidone ([name]) 20 MG capsule Take 20 mg by mouth at bedtime. 08/30/20   [provider]    Allergies    Dilaudid [hydromorphone] and Talwin [pentazocine]  Review of Systems   Review of Systems  Constitutional:  Negative for fever.  Respiratory:  Negative for shortness of breath.   Cardiovascular:  Negative for chest pain and palpitations.  Gastrointestinal:  Positive for abdominal distention, abdominal pain and nausea. Negative for constipation and vomiting.  Endocrine: Positive for cold intolerance.  All other systems reviewed and are negative.  Physical Exam Updated Vital Signs BP 129/77   Pulse 87   Temp 98.4 F (36.9 C) (Oral)   Resp 17    Ht 5' 5"  (1.651 m)   Wt 79.8 kg   SpO2 100%   BMI 29.29 kg/m   Physical Exam Vitals and nursing note reviewed.  Constitutional:      Appearance: Normal appearance.  HENT:     Head: Normocephalic and atraumatic.     Right Ear: External ear normal.     Left  Ear: External ear normal.     Nose: Nose normal.     Mouth/Throat:     Mouth: Mucous membranes are moist.  Eyes:     Extraocular Movements: Extraocular movements intact.  Cardiovascular:     Rate and Rhythm: Normal rate and regular rhythm.     Heart sounds: No murmur heard.   No friction rub. No gallop.  Pulmonary:     Effort: Pulmonary effort is normal. No respiratory distress.     Breath sounds: No wheezing, rhonchi or rales.  Abdominal:     General: Abdomen is flat. Bowel sounds are normal.     Tenderness: There is abdominal tenderness in the right upper quadrant, right lower quadrant and left upper quadrant. There is no guarding or rebound.     Comments: Percutaneous drain in RUQ with amber fluid in bag. Insertion site appears clean.  Musculoskeletal:        General: No swelling or deformity. Normal range of motion.     Cervical back: Normal range of motion and neck supple.  Skin:    General: Skin is warm and dry.  Neurological:     General: No focal deficit present.     Mental Status: She is alert and oriented to person, place, and time.  Psychiatric:        Mood and Affect: Mood normal.        Behavior: Behavior normal.    ED Results / Procedures / Treatments   Labs (all labs ordered are listed, but only abnormal results are displayed) Labs Reviewed  COMPREHENSIVE METABOLIC PANEL - Abnormal; Notable for the following components:      Result Value   Glucose, Bld 204 (*)    BUN 30 (*)    AST 48 (*)    Alkaline Phosphatase 203 (*)    Total Bilirubin 0.2 (*)    All other components within normal limits  CBC - Abnormal; Notable for the following components:   Hemoglobin 11.5 (*)    RDW 19.4 (*)     Platelets 136 (*)    All other components within normal limits  RESP PANEL BY RT-PCR (FLU A&B, COVID) ARPGX2  LIPASE, BLOOD  URINALYSIS, ROUTINE W REFLEX MICROSCOPIC  I-STAT BETA HCG BLOOD, ED ([facility], [facility], [facility] ONLY)    EKG None  Radiology No results found.  Procedures Procedures   Medications Ordered in ED Medications - No data to display  ED Course  I have reviewed the triage vital signs and the nursing notes.  Pertinent labs & imaging results that were available during my care of the patient were reviewed by me and considered in my medical decision making (see chart for details).    MDM Rules/Calculators/A&P                          Patient with complex history but in brief had partial cholecystectomy in march 2021 with repeated episodes of bile leak since then, s/p several ERCP and stent placement most recently in May 2022. This week had recurrence of bile leak. She appears well, mild tenderness on exam, labs overall reassuring.   Spoke with GI who requested medical admission to determine further management of her bile leak. Options include biliary bypass by surgery vs percutaneous cholecystostomy drain. Spoke with [facility] hospitalist who will admit for further care.  Final Clinical Impression(s) / ED Diagnoses Final diagnoses:  Bile leak     Andrew Au, MD 11/01/20 2124  Deno Etienne, DO 11/01/20 2230

## 2020-11-01 NOTE — Telephone Encounter (Signed)
Even after I spoke with the patient about the utility of a second drain being placed she was very hesitant about that not sure that she would want that versus just undergoing a biliary bypass.  Thus I suspect she will want to hear that from the surgeons.

## 2020-11-01 NOTE — H&P (Addendum)
History and Physical    PLEASE NOTE THAT DRAGON DICTATION SOFTWARE WAS USED IN THE CONSTRUCTION OF THIS NOTE.   Kim Hicks CVE:[id number] DOB: 1965/10/29 DOA: 11/01/2020  PCP: [name], FNP Patient coming from: home   I have personally briefly reviewed patient's old medical records in [place]  Chief Complaint: Biliary drain leak  HPI: Kim Hicks is a 55 y.o. female with medical history significant for complicated partial cholecystectomy in March 2021 with ensuing urinary leak status post percutaneous biliary drain Neri stents, type 2 diabetes mellitus, hypertension, hyperlipidemia, who is admitted to [facility] on 11/01/2020 for further evaluation and management of progressive biliary drain leakage.  The patient reportedly underwent partial cholecystectomy in March 2021 with ensuing recurrent biliary leak status post percutaneous biliary drain as well as recurrent placement of biliary stents in the setting of 6 prior ERCPs, with most recent such ERCP occurring in May 2022.  And following with her outpatient gastroenterologist, Dr. Rush Landmark, Recent outpatient imaging demonstrated migration of her metal biliary duct stent to the level of the rectum leaving only a plastic cystic duct stent in place.  Additionally, patient reports 1 week of output via her percutaneous biliary drain noting daily output of 200 to 800 cc of brown-colored fluid relative to her baseline daily output of less than 50 cc leading up to the last 1 week.  She reports mild increase in right upper quadrant abdominal discomfort over the course of the last week, but denies associated diarrhea, melena, or hematochezia.  She also denies any recent nausea or vomiting.  Denies subjective fever, chills, rigors, or generalized myalgias.  Not associate with any recent chest pain, shortness of breath, palpitations, diaphoresis, dizziness, presyncope, or syncope.  Denies any recent worsening of edema in the lower  extremities, orthopnea, or PND.  Not associate with any recent dysuria, gross hematuria, or change in urinary urgency/frequency.  Recent headache, neck stiffness, sore throat, cough, rash.  No known recent COVID-19 exposures.  Not on any blood thinners at home, including no aspirin.  In the setting of the above, the patient has been closely following with Dr.Mansouraty, as her outpatient gastroenterologist.  Per chart review, given this progression of output via her existing percutaneous biliary drain as well as migration of her metal biliary duct stent in spite of 6 prior ERCPs, Dr.MansouratyHas been discussing with the patient the potential for placement of an additional percutaneous biliary drain as an intermediate step towards potential ultimate biliary bypass.  In taking steps interventions, Dr.MansouratyHas arranged for patient general surgery consultation to assess potential biliary bypass.  Specifically the patient confirms that she has an outpatient general surgery consultation for this purpose scheduled with Dr. Zenia Resides in July, with the patient recommend this appointment specifically set to occur on July 11.  However, given recent acceleration of output via her existing percutaneous biliary drain, Dr.MansouratyInstructed the patient to present to [facility] emergency department for admission to help expedite potential placement of a new percutaneous biliary drain as well as to expedite general surgery consultation for consideration of biliary bypass.      ED Course:  Vital signs in the ED were notable for the following:  -Temperature max 98.4, heart rate 84-1 01; blood pressure 108/69 129/77; respiratory rate 16-18, oxygen saturation 97 to 100% on room air  Labs were notable for the following: CMP notable for the following: Sodium 148, potassium 5.1, bicarbonate 23, anion gap 9, creatinine 0.72, glucose 204, albumin 4.3, alkaline phosphatase 203,  AST 48, ALT 28, total bilirubin 0.2.  CBC notable  for low blood cell count of 6700.  Urinalysis notable for 6-10 white blood cells, nitrate negative, and 6-10 squamous epithelial cells.  Screening nasopharyngeal COVID-19/influenza PCR were performed in the ED this evening and found to be negative.    Review of Systems: As per HPI otherwise 10 point review of systems negative.   Past Medical History:  Diagnosis Date   Anxiety    Cataract    Cirrhosis of liver ([place])    Common migraine with intractable migraine 08/07/2020   Depression    Diabetes mellitus without complication (HCC)    Dysrhythmia    Edema    Family history of adverse reaction to anesthesia    mother had trouble waking up after surgery   Foot fracture, left    Headache    MIGRAINES   Hyperlipidemia    Hypertension    Neuromuscular disorder (HCC)    NEUROPATHY-both hands and feet   On supplemental oxygen by nasal cannula    2L at bedtime only   Palpitations    Wheezing     Past Surgical History:  Procedure Laterality Date   ABDOMINAL HYSTERECTOMY     one ovary left   BILIARY STENT PLACEMENT N/A 08/07/2019   Procedure: BILIARY STENT PLACEMENT;  Surgeon: [name], MD;  Location: [facility];  Service: Endoscopy;  Laterality: N/A;   BILIARY STENT PLACEMENT N/A 11/07/2019   Procedure: BILIARY STENT PLACEMENT;  Surgeon: [name], MD;  [facility];  Service: Endoscopy;  Laterality: N/A;   BILIARY STENT PLACEMENT N/A 05/13/2020   Procedure: BILIARY STENT PLACEMENT;  Surgeon: [name], MD;  [facility];  Service: Endoscopy;  Laterality: N/A;   BILIARY STENT PLACEMENT N/A 05/27/2020   Procedure: BILIARY STENT PLACEMENT;  Surgeon: [name], MD;  [facility];  Service: Endoscopy;  Laterality: N/A;   BILIARY STENT PLACEMENT N/A 06/26/2020   Procedure: BILIARY STENT PLACEMENT;  Surgeon: [name], MD;  [facility];  Service: Gastroenterology;  Laterality: N/A;   BILIARY STENT PLACEMENT N/A  09/30/2020   Procedure: BILIARY STENT PLACEMENT;  Surgeon: [name], MD;  [facility];  Service: Gastroenterology;  Laterality: N/A;   BIOPSY  06/26/2020   Procedure: BIOPSY;  Surgeon: [name], MD;  Location: [facility];  Service: Gastroenterology;;   CATARACT EXTRACTION W/PHACO Right 01/11/2018   Procedure: CATARACT EXTRACTION PHACO AND INTRAOCULAR LENS PLACEMENT ([place]);  Surgeon: [name], MD;  Location: [facility]S;  Service: Ophthalmology;  Laterality: Right;  [place] 00:57.5 AP% 15.0 CDE 8.61 Fluid Pack Lot # [id number]   CATARACT EXTRACTION W/PHACO Left 02/15/2018   Procedure: CATARACT EXTRACTION PHACO AND INTRAOCULAR LENS PLACEMENT ([place]);  Surgeon: [name], MD;  Location: [facility] OR[facility];  Service: Ophthalmology;  Laterality: Left;  [place] 00:51 AP% 13.2 CDE 6.82 Fluid p ack lot # [id number]   CHOLECYSTECTOMY N/A 08/04/2019   Procedure: LAPAROSCOPIC CHOLECYSTECTOMY;  Surgeon: [name], MD;  [facility]: [facility];  Service: General;  Laterality: N/A;   COLONOSCOPY     ENDOSCOPIC RETROGRADE CHOLANGIOPANCREATOGRAPHY (ERCP) WITH PROPOFOL N/A 11/07/2019   Procedure: ENDOSCOPIC RETROGRADE CHOLANGIOPANCREATOGRAPHY (ERCP) WITH PROPOFOL;  Surgeon: [name], MD;  [facility];  Service: Endoscopy;  Laterality: N/A;   ENDOSCOPIC RETROGRADE CHOLANGIOPANCREATOGRAPHY (ERCP) WITH PROPOFOL N/A 05/13/2020   Procedure: ENDOSCOPIC RETROGRADE CHOLANGIOPANCREATOGRAPHY (ERCP) WITH PROPOFOL;  Surgeon: [name], MD;  [facility];  Service: Endoscopy;  Laterality: N/A;   ENDOSCOPIC RETROGRADE CHOLANGIOPANCREATOGRAPHY (ERCP) WITH PROPOFOL N/A 06/26/2020   Procedure: ENDOSCOPIC RETROGRADE CHOLANGIOPANCREATOGRAPHY (ERCP) WITH PROPOFOL;  Surgeon: [name], MD;  [facility];  Service: Gastroenterology;  Laterality: N/A;   ENDOSCOPIC RETROGRADE CHOLANGIOPANCREATOGRAPHY (ERCP) WITH PROPOFOL N/A 09/30/2020   Procedure:  ENDOSCOPIC RETROGRADE CHOLANGIOPANCREATOGRAPHY (ERCP) WITH PROPOFOL;  Surgeon: [name], MD;  [facility];  Service: Gastroenterology;  Laterality: N/A;   ERCP N/A 08/07/2019   Procedure: ENDOSCOPIC RETROGRADE CHOLANGIOPANCREATOGRAPHY (ERCP);  Surgeon: [name], MD;  Location: [facility];  Service: Endoscopy;  Laterality: N/A;   ERCP N/A 05/27/2020   Procedure: ENDOSCOPIC RETROGRADE CHOLANGIOPANCREATOGRAPHY (ERCP);  Surgeon: [name], MD;  Location: [facility];  Service: Endoscopy;  Laterality: N/A;   ESOPHAGOGASTRODUODENOSCOPY N/A 06/26/2020   Procedure: ESOPHAGOGASTRODUODENOSCOPY (EGD);  Surgeon: [name]., MD;  Location: [facility];  Service: Gastroenterology;  Laterality: N/A;   IR CHOLANGIOGRAM EXISTING TUBE  10/16/2020   IR EXCHANGE BILIARY DRAIN  07/10/2020   IR EXCHANGE BILIARY DRAIN  08/28/2020   IR RADIOLOGIST EVAL & MGMT  06/20/2020   RCR Bilateral    REMOVAL OF STONES  08/07/2019   Procedure: REMOVAL OF STONES;  Surgeon: [name], MD;  [facility];  Service: Endoscopy;;  balloon sweep, no stones   REMOVAL OF STONES  05/27/2020   Procedure: REMOVAL OF STONES;  Surgeon: [name], MD;  [facility];  Service: Endoscopy;;   REMOVAL OF STONES  06/26/2020   Procedure: REMOVAL OF STONES;  Surgeon: [name]., MD;  Location: [facility];  Service: Gastroenterology;;   REMOVAL OF STONES  09/30/2020   Procedure: REMOVAL OF STONES;  Surgeon: [name]., MD;  Location: [facility];  Service: Gastroenterology;;   [name]  08/07/2019   Procedure: SPHINCTEROTOMY;  Surgeon: [name], MD;  [facility];  Service: Endoscopy;;   STENT REMOVAL  11/07/2019   Procedure: STENT REMOVAL;  Surgeon: [name], MD;  [facility];  Service: Endoscopy;;   STENT REMOVAL  05/13/2020   Procedure: STENT REMOVAL;  Surgeon: [name], MD;  [facility];  Service: Endoscopy;;   STENT REMOVAL  05/27/2020   Procedure: STENT REMOVAL;  Surgeon: [name], MD;  [facility];  Service: Endoscopy;;   STENT REMOVAL  06/26/2020   Procedure: STENT REMOVAL;  Surgeon: [name]., MD;  Location[facility];  Service: Gastroenterology;;   Lavell Islam REMOVAL  09/30/2020   Procedure: STENT REMOVAL;  Surgeon: [name]., MD;  [facility];  Service: Gastroenterology;;    Social History:  reports that she has never smoked. She has never used smokeless tobacco. She reports that she does not drink alcohol and does not use drugs.   Allergies  Allergen Reactions   Dilaudid [Hydromorphone] Hives   Talwin [Pentazocine]     Turned blue around lips and face, rash all over body    Family History  Problem Relation Age of Onset   Diabetes Mother    Heart failure Mother    Depression Mother    Colon cancer Neg Hx    Stomach cancer Neg Hx    Esophageal cancer Neg Hx    Pancreatic cancer Neg Hx     Family history reviewed and not pertinent    Prior to Admission medications   Medication Sig Start Date End Date Taking? Authorizing Provider  acetaminophen (TYLENOL) 500 MG tablet Take 1,000 mg by mouth every 6 (  six) hours as needed for moderate pain.   Yes [provider]  AIMOVIG 140 MG/ML SOAJ Inject 140 mg into the skin every 30 (thirty) days. 09/10/20  Yes [provider]  ALPRAZolam Duanne Moron) 0.5 MG tablet Take 0.5 mg by mouth 2 (two) times daily as needed for anxiety. 08/30/20  Yes [provider]  brexpiprazole (REXULTI) 2 MG TABS tablet Take 2 mg by mouth at bedtime.   Yes [provider]  Dulaglutide 4.5 MG/0.5ML SOPN Inject 4.5 mg into the skin every Monday. 12/20/18  Yes [provider]  Empagliflozin-metFORMIN HCl ER (SYNJARDY XR) 25-1000 MG TB24 Take 1 tablet by mouth daily before breakfast.  11/15/18  Yes [provider]  ezetimibe (ZETIA) 10 MG  tablet Take 10 mg by mouth daily. 04/25/20  Yes [provider]  Insulin Glargine (BASAGLAR KWIKPEN) 100 UNIT/ML Inject 30 Units into the skin at bedtime. 03/20/20  Yes [provider]  iron polysaccharides (NIFEREX) 150 MG capsule Take 1 capsule (150 mg total) by mouth daily. Patient taking differently: Take 150 mg by mouth every other day. 10/04/20  Yes Eugenie Filler, MD  lactulose (CHRONULAC) 10 GM/15ML solution TAKE 15 MLS (10 G TOTAL) BY MOUTH 3 (THREE) TIMES DAILY AS NEEDED FOR MILD CONSTIPATION. Patient taking differently: Take 10 g by mouth 3 (three) times daily. 10/01/20  Yes [name], MD  linaclotide [facility]) 290 MCG CAPS capsule Take 1 capsule (290 mcg total) by mouth daily before breakfast. 08/30/20  Yes [name], MD  lisinopril (ZESTRIL) 40 MG tablet Take 40 mg by mouth every morning. 09/27/20  Yes [provider]  meloxicam (MOBIC) 15 MG tablet Take 1 tablet by mouth daily. 10/15/20  Yes [provider]  metFORMIN (GLUCOPHAGE) 1000 MG tablet Take 1,000 mg by mouth at bedtime. 02/27/20  Yes [provider]  ondansetron (ZOFRAN-ODT) 4 MG disintegrating tablet Take 4 mg by mouth every 8 (eight) hours as needed for nausea or vomiting.   Yes [provider]  oxyCODONE (ROXICODONE) 5 MG immediate release tablet Take 1 tablet (5 mg total) by mouth every 6 (six) hours as needed for severe pain. 09/30/20  Yes Mansouraty, Telford Nab., MD  pantoprazole (PROTONIX) 40 MG tablet Take 40 mg by mouth daily. 08/19/20  Yes [provider]  prazosin (MINIPRESS) 2 MG capsule Take 4 mg by mouth at bedtime.   Yes [provider]  pregabalin (LYRICA) 100 MG capsule One capsule in the morning and midday, 2 in the evening Patient taking differently: Take 100 mg by mouth See admin instructions. Take 100 mg   in the morning and midday, 200 mg in the evening 08/07/20  Yes Kathrynn Ducking, MD  QUEtiapine (SEROQUEL) 400 MG tablet Take  0.5 tablets (200 mg total) by mouth at bedtime. Patient taking differently: Take 800 mg by mouth at bedtime. 07/13/20  Yes [place] Eric J, DO  QUEtiapine (SEROQUEL) 50 MG tablet Take 100 mg by mouth every morning. 08/30/20  Yes [provider]  simvastatin (ZOCOR) 10 MG tablet Take 10 mg by mouth daily. 12/13/17  Yes [provider]  traMADol ([name]) 50 MG tablet Take 50 mg by mouth 2 (two) times daily. 08/24/20  Yes [provider]  vortioxetine HBr ([name]) 20 MG TABS tablet Take 20 mg by mouth at bedtime.   Yes [provider]  ziprasidone ([name]) 20 MG capsule Take 20 mg by mouth at bedtime. 08/30/20  Yes [provider]  Objective    Physical Exam: Vitals:   11/01/20 1854 11/01/20 1957 11/01/20 2030 11/01/20 2130  BP: 108/69 109/62 129/77 116/62  Pulse: 65 88 87 84  Resp: 16 18 17 17   Temp: 98.4 F (36.9 C)     TempSrc: Oral     SpO2: 97% 100% 100% 100%  Weight:      Height:        General: appears to be stated age; alert, oriented Skin: warm, dry, no rash Head:  AT/[place] Mouth:  Oral mucosa membranes appear dry, normal dentition Neck: supple; trachea midline Heart:  RRR; did not appreciate any M/R/G Lungs: CTAB, did not appreciate any wheezes, rales, or rhonchi Abdomen: + BS; soft, ND; percutaneous biliary drain noted only 100 cc of light brown-colored fluid in bag; mild tenderness associated with the right upper quadrant in the absence of any associated guarding, rigidity, or rebound tenderness. Vascular: 2+ pedal pulses b/l; 2+ radial pulses b/l Extremities: no peripheral edema, no muscle wasting Neuro: strength and sensation intact in upper and lower extremities b/l    Labs on Admission: I have personally reviewed following labs and imaging studies  CBC: Recent Labs  Lab 11/01/20 1326  WBC 6.7  HGB 11.5*  HCT 38.3  MCV 90.1  PLT 681*   Basic Metabolic Panel: Recent Labs  Lab 11/01/20 1326  NA 140  K 5.1   CL 108  CO2 23  GLUCOSE 204*  BUN 30*  CREATININE 0.72  CALCIUM 9.4   GFR: Estimated Creatinine Clearance: 82.9 mL/min (by C-G formula based on SCr of 0.72 mg/dL). Liver Function Tests: Recent Labs  Lab 11/01/20 1326  AST 48*  ALT 38  ALKPHOS 203*  BILITOT 0.2*  PROT 8.0  ALBUMIN 4.3   Recent Labs  Lab 11/01/20 1326  LIPASE 37   No results for input(s): AMMONIA in the last 168 hours. Coagulation Profile: No results for input(s): INR, PROTIME in the last 168 hours. Cardiac Enzymes: No results for input(s): CKTOTAL, CKMB, CKMBINDEX, TROPONINI in the last 168 hours. BNP (last 3 results) No results for input(s): PROBNP in the last 8760 hours. HbA1C: No results for input(s): HGBA1C in the last 72 hours. CBG: No results for input(s): GLUCAP in the last 168 hours. Lipid Profile: No results for input(s): CHOL, HDL, LDLCALC, TRIG, CHOLHDL, LDLDIRECT in the last 72 hours. Thyroid Function Tests: No results for input(s): TSH, T4TOTAL, FREET4, T3FREE, THYROIDAB in the last 72 hours. Anemia Panel: No results for input(s): VITAMINB12, FOLATE, FERRITIN, TIBC, IRON, RETICCTPCT in the last 72 hours. Urine analysis:    Component Value Date/Time   COLORURINE YELLOW 11/01/2020 2125   APPEARANCEUR CLEAR 11/01/2020 2125   LABSPEC 1.018 11/01/2020 2125   PHURINE 5.0 11/01/2020 2125   GLUCOSEU >=500 (A) 11/01/2020 2125   HGBUR NEGATIVE 11/01/2020 2125   BILIRUBINUR NEGATIVE 11/01/2020 2125   Reading NEGATIVE 11/01/2020 2125   PROTEINUR NEGATIVE 11/01/2020 2125   NITRITE NEGATIVE 11/01/2020 2125   LEUKOCYTESUR MODERATE (A) 11/01/2020 2125    Radiological Exams on Admission: No results found.    Assessment/Plan   Kim Hicks is a 55 y.o. female with medical history significant for complicated partial cholecystectomy in March 2021 with ensuing urinary leak status post percutaneous biliary drain Neri stents, type 2 diabetes mellitus, hypertension, hyperlipidemia, who is  admitted to [facility] on 11/01/2020 for further evaluation and management of progressive biliary drain leakage.   Principal Problem:   Biliary drain displacement Active Problems:   Diabetes  mellitus without complication ([place])   Hyperlipidemia associated with type 2 diabetes mellitus ([place])   Hypertension   GERD     #) Biliary drain leak: In the setting of a history of complicated partial cholecystectomy in March 2021 resulting in ensuing placement of percutaneous biliary drain as well as 6 ERCPs associated biliary stent placement and ensuing migration of metal biliary duct stent to the level of the rectum leaving only plastic cystic duct stent in place, as further described above, the patient presents with 1 week of increase in volume of output via the percutaneous biliary drain prompting her outpatient gastroenterologist, Dr. Rush Landmark, to recommend patient to present to the emergency department for admission for expediting consideration of placement of new percutaneous biliary drain also expediting general surgery consultation for potential biliary bypass.  At the time of my encounter with her, the patient minimal aminal discomfort relative to baseline denies offer for as needed IV analgesia at this time.  She is otherwise asymptomatic at this time, and is nonseptic appearing.  She appears hemodynamically stable.  Will be n.p.o. gentle/maintenance IV fluids, with plan interventional radiology consultation for potential placement of new percutaneous biliary drain gastroenterology plan, as outlined above.  General surgery consultation in the morning for evaluation of potential ultimate biliary bypass given the above presentation.  Plan: NPO.  Continuous LR at 75 cc/h.  Monitor strict I's and O's and daily weights.  As needed IV Zofran.  Check INR.  Check preoperative EKG.  Repeat CMP in the morning we will also repeat CBC in the morning.  IR consultation for potential placement of new  percutaneous biliary drain, as above.  General surgery consultation in the morning for evaluation of potential biliary bypass.  Refraining from pharmacologic DVT prophylaxis at this time.  SCDs.      #) Type 2 diabetes mellitus: The patient insulin regimen consists of Lantus 30 units subcu nightly.  Additionally, her oral hypoglycemic regimen as an outpatient consists of metformin as well as empagliflozin.  Presenting blood sugar per presenting CMP noted to be 204.  We will hold home oral hypoglycemic agents during this hospitalization, and resume portion of her home basal insulin, as further quantified below.  Plan: Hold home oral hypoglycemic agents, as above.  In terms of initial basal insulin initiation during this hospitalization, we will start conservatively with Lantus 10 units subcu nightly, first dose now.  In the setting of current n.p.o. status, will monitor Accu-Cheks every 6 hours with low-dose sliding scale insulin.      #) Hyperlipidemia: On Zetia as well as simvastatin as an outpatient.  Plan: In the setting of current n.p.o. status, will hold home simvastatin and Zetia for now.      #) Essential hypertension: Outpatient antihypertensive regimen consists of lisinopril.  Presenting systolic blood pressures have been normotensive in the ED thus far.  No evidence of hypotension.   Plan: In the setting of current n.p.o. status, will hold home lisinopril for now.  Close monitoring of ensuing blood pressure via routine vital signs.       #) GERD: On scheduled Protonix as an outpatient.  Plan: In the setting of current n.p.o. status, will hold oral Protonix.  Rather, Protonix 40 mg IV daily status persist.       DVT prophylaxis: SCDs Code Status: Full code Family Communication: The patient's case was discussed with her husband, who was present at bedside Disposition Plan: Per Rounding Team;  Consults called: anticipate ensuing consultation of IR as well  as general  surgery, as further detailed above  Admission status: Inpatient; med telemetry     Of note, this patient was added by me to the following Admit List/Treatment Team: [email or web address].      PLEASE NOTE THAT DRAGON DICTATION SOFTWARE WAS USED IN THE CONSTRUCTION OF THIS NOTE.   [facility] Pager [phone] From [place]  Otherwise, please contact night-coverage  [email or web address] Password [facility]   11/01/2020, 10:01 PM

## 2020-11-01 NOTE — Telephone Encounter (Signed)
The pt is currently at [facility] ED- she says she has been waiting since 12 noon.  She would like to know if Dr Rush Landmark can call the ED on her behalf.

## 2020-11-01 NOTE — Telephone Encounter (Signed)
Thank you [name] for the update. MS and JMP, looks like this patient's biliary drain output has significantly increased.   Her fully covered metal stent looks to have already made its way to the rectum so she only has a plastic cystic duct stent at this time in place. I discussed this patient's case (I think Norberto Sorenson you are on these other messages) about a percutaneous biliary drain being placed prior to a true biliary bypass as had been discussed with Dr. Barry Dienes and Dr. Zenia Resides.  The patient was hesitant and wanted to meet with the surgeons first which was what was being arranged because she did not want to have a second bag/drain. Now the leak seems to be more significant and drainage occurring every 3-4 hours. I think that now for biliary leak she may need a PBD to be placed, and have the surgeons discussed that with her further if she feels uncomfortable with that plan. Repeat ERCP to remove the stent or put 2 more plastic stents could be considered as a salvage while waiting. Since she has a subtotal cholecystectomy, cystic duct ablation is not possible. I will be main doctor on Monday. Thanks. GM

## 2020-11-01 NOTE — ED Triage Notes (Signed)
MD told her to come to ED. He would like to place a drain.

## 2020-11-05 ENCOUNTER — Telehealth: Payer: Self-pay | Admitting: *Deleted

## 2020-11-05 ENCOUNTER — Ambulatory Visit (AMBULATORY_SURGERY_CENTER): Payer: Medicare Other | Admitting: *Deleted

## 2020-11-05 ENCOUNTER — Other Ambulatory Visit: Payer: Self-pay

## 2020-11-05 VITALS — Ht 65.0 in | Wt 178.0 lb

## 2020-11-05 DIAGNOSIS — D509 Iron deficiency anemia, unspecified: Secondary | ICD-10-CM

## 2020-11-05 DIAGNOSIS — Z9981 Dependence on supplemental oxygen: Secondary | ICD-10-CM | POA: Insufficient documentation

## 2020-11-05 NOTE — Telephone Encounter (Signed)
Sheri, Can you please schedule this pt at [facility] for a colon and egd per below- thanks  [employer]

## 2020-11-05 NOTE — Telephone Encounter (Signed)
Please schedule colonoscopy and EGD at the hospital. She needs a 2 day bowel prep for her colonoscopy.

## 2020-11-05 NOTE — Progress Notes (Signed)
No egg or soy allergy known to patient  No issues with past sedation with any surgeries or procedures Patient denies ever being told they had issues or difficulty with intubation  No FH of Malignant Hyperthermia No diet pills per patient No home 02 use per patient  No blood thinners per patient  Pt STATES  issues with constipation - SHE IS ON LINZESS AND LACTULOSE BUT HAS ISSUES WITH HARD STOOLS- HAS A BM 1-2 X A WEEK - SHE HAS AN EXTENSIVE PREP FOR HER COLON - pt was given plenvu sample at 10-25-20 ov   No A fib or A flutter  EMMI video to pt or via MyChart  COVID 19 guidelines implemented in [place] today with Pt and RN  Pt is fully vaccinated  for Covid   PT STATES AT PV SHE IS ON HOME 02= 2 LITERS AT BEDTIME- SHE ALSO IS HAVING DYSPHAGIA AND WANTS AN EGD WITH HER COLON- TE TO [facility]   Due to the COVID-19 pandemic we are asking patients to follow certain guidelines.  Pt aware of COVID protocols and [facility] guidelines   Pt verified name, DOB, address and insurance during PV today. Pt mailed instruction packet to included paper to complete and mail back to [facility] with addressed and stamped envelope, Emmi video, copy of consent form to read and not return, and instructions.PV completed over the phone. Pt encouraged to call with questions or issues.   Emailed instructions to [email or web address]

## 2020-11-05 NOTE — Telephone Encounter (Signed)
DR Fuller Plan, AND JOHN   I HAVE DONE A PV THIS MORNING ON [place]- SHE HAD AN OV  WITH PAULA 10-25-2020 - AT PV THIS MORNING PT TOLD ME SHE IS ON HOME 02 - 2 LITERS EVERY SINGLE NIGHT ALL NIGHT LONG-  SHE HAS A COLON [place]. IN THE [facility] FOR WED 6-22 FOR IDA-  SHE ALSO SAYS SHE HAS A STENT LODGES IN HER RECTUM - SHE WAS SUPPOSED TO BE ADMITTED LAST WEEK BUT REFUSED- SHE ISNT SURE IF PASSED STENT, SHE HAS ONLY HAD 2 BM'S SINCE LAST WEEK   WILL SHE NEED A [facility] CASE FOR HER COLON DUE TO HER HOME 02??  PLEASE ADVISE  THANKS   MARIE PV

## 2020-11-05 NOTE — Telephone Encounter (Signed)
PT STATES SHE IS GETTING CHOKED AND IS HAVING ISSUES SWALLOWING FOODS , PILLS AND LIQUIDS AND SHE THINKS SHE NEEDS TO HAVE HER THROAT STRETCHED AS WELL AS HER COLON- PLEASE ADVISE

## 2020-11-06 ENCOUNTER — Other Ambulatory Visit: Payer: Self-pay

## 2020-11-06 DIAGNOSIS — K838 Other specified diseases of biliary tract: Secondary | ICD-10-CM

## 2020-11-06 NOTE — Telephone Encounter (Signed)
Inbound call from patient. Calling in about a drain put in. Best contact number [phone]

## 2020-11-06 NOTE — Telephone Encounter (Signed)
I spoke with IR and Tiffany tells me that this will have to be reviewed because they do not typically place second drains.  She is suppose to call me back when she gets some answers.

## 2020-11-06 NOTE — Telephone Encounter (Signed)
Tiffany returned call and states that the pt will need an appt with IR to discuss plans for drain.  She has entered the referral and the pt will be called to set up appt.

## 2020-11-06 NOTE — Telephone Encounter (Signed)
Awesome. Thanks. GM

## 2020-11-06 NOTE — Telephone Encounter (Signed)
Thanks [name]. Agree this would be a percutaneous biliary drain in just the percutaneous cholecystostomy drain that is in place. If that person speaks with one of the radiologists and is still having questions please ask for the radiologist name and I will try to discuss and talk with them about the plan of action. Thanks. GM

## 2020-11-06 NOTE — Telephone Encounter (Signed)
The pt is calling asking when she is suppose to have the second drain placed. I asked about the ED visit and she tells me that she was suppose to be admitted and left AMA.  She has an appt with IR on 7/5.  Is there anything else either of you want to do prior to that appt?

## 2020-11-06 NOTE — Telephone Encounter (Signed)
[name], Thanks for update, I was wondering what happened. If she is willing to have the percutaneous biliary drain placed, as had been prior discussion and thoughts from the surgeons, then we will need to place an order for it and see if they can meet the patient before the follow up appointment. If she is amenable, then place an outpatient order for Percutaneous Drain Placement. She will need to be evaluated by IR and get an earlier appointment so they can talk with her. She will likely require admission after for at least a day, but again that will be up to IR.  Let's place the order. Let's get her an earlier follow up with Interventional Radiology. Once she is on the books, if you can let me know who it is with, then I can loop them into the conversation. Thanks.  GM

## 2020-11-13 ENCOUNTER — Encounter: Payer: Self-pay | Admitting: Gastroenterology

## 2020-11-13 ENCOUNTER — Other Ambulatory Visit: Payer: Self-pay | Admitting: Pharmacy Technician

## 2020-11-18 ENCOUNTER — Other Ambulatory Visit: Payer: Self-pay

## 2020-11-18 ENCOUNTER — Other Ambulatory Visit: Payer: Self-pay | Admitting: Nurse Practitioner

## 2020-11-18 ENCOUNTER — Encounter (HOSPITAL_COMMUNITY): Payer: Self-pay

## 2020-11-18 ENCOUNTER — Emergency Department (HOSPITAL_COMMUNITY): Payer: Medicare Other

## 2020-11-18 ENCOUNTER — Emergency Department (HOSPITAL_COMMUNITY)
Admission: EM | Admit: 2020-11-18 | Discharge: 2020-11-18 | Disposition: A | Discharge status: Home / Self Care | Payer: Medicare Other | Attending: Emergency Medicine | Admitting: Emergency Medicine

## 2020-11-18 DIAGNOSIS — Z79899 Other long term (current) drug therapy: Secondary | ICD-10-CM | POA: Insufficient documentation

## 2020-11-18 DIAGNOSIS — Z7984 Long term (current) use of oral hypoglycemic drugs: Secondary | ICD-10-CM | POA: Insufficient documentation

## 2020-11-18 DIAGNOSIS — Z794 Long term (current) use of insulin: Secondary | ICD-10-CM | POA: Diagnosis not present

## 2020-11-18 DIAGNOSIS — I10 Essential (primary) hypertension: Secondary | ICD-10-CM | POA: Diagnosis not present

## 2020-11-18 DIAGNOSIS — E1142 Type 2 diabetes mellitus with diabetic polyneuropathy: Secondary | ICD-10-CM | POA: Diagnosis not present

## 2020-11-18 DIAGNOSIS — Z20822 Contact with and (suspected) exposure to covid-19: Secondary | ICD-10-CM | POA: Diagnosis not present

## 2020-11-18 DIAGNOSIS — R112 Nausea with vomiting, unspecified: Secondary | ICD-10-CM | POA: Diagnosis not present

## 2020-11-18 DIAGNOSIS — R1084 Generalized abdominal pain: Secondary | ICD-10-CM | POA: Diagnosis present

## 2020-11-18 DIAGNOSIS — R519 Headache, unspecified: Secondary | ICD-10-CM | POA: Diagnosis not present

## 2020-11-18 DIAGNOSIS — K5900 Constipation, unspecified: Secondary | ICD-10-CM

## 2020-11-18 LAB — COMPREHENSIVE METABOLIC PANEL
ALT: 31 U/L (ref 0–44)
AST: 45 U/L — ABNORMAL HIGH (ref 15–41)
Albumin: 4.5 g/dL (ref 3.5–5.0)
Alkaline Phosphatase: 163 U/L — ABNORMAL HIGH (ref 38–126)
Anion gap: 10 (ref 5–15)
BUN: 31 mg/dL — ABNORMAL HIGH (ref 6–20)
CO2: 19 mmol/L — ABNORMAL LOW (ref 22–32)
Calcium: 9.5 mg/dL (ref 8.9–10.3)
Chloride: 107 mmol/L (ref 98–111)
Creatinine, Ser: 0.89 mg/dL (ref 0.44–1.00)
GFR, Estimated: >60 mL/min (ref >60)
Glucose, Bld: 149 mg/dL — ABNORMAL HIGH (ref 70–99)
Potassium: 5.4 mmol/L — ABNORMAL HIGH (ref 3.5–5.1)
Sodium: 136 mmol/L (ref 135–145)
Total Bilirubin: 0.5 mg/dL (ref 0.3–1.2)
Total Protein: 8.1 g/dL (ref 6.5–8.1)

## 2020-11-18 LAB — URINALYSIS, ROUTINE W REFLEX MICROSCOPIC
Bilirubin Urine: NEGATIVE
Glucose, UA: >=500 mg/dL — AB
Hgb urine dipstick: NEGATIVE
Ketones, ur: NEGATIVE mg/dL
Nitrite: NEGATIVE
Protein, ur: NEGATIVE mg/dL
Specific Gravity, Urine: 1.017 (ref 1.005–1.030)
pH: 5 (ref 5.0–8.0)

## 2020-11-18 LAB — CBC WITH DIFFERENTIAL/PLATELET
Abs Immature Granulocytes: 0.03 10*3/uL (ref 0.00–0.07)
Basophils Absolute: 0 10*3/uL (ref 0.0–0.1)
Basophils Relative: 1 %
Eosinophils Absolute: 0.4 10*3/uL (ref 0.0–0.5)
Eosinophils Relative: 7 %
HCT: 37.9 % (ref 36.0–46.0)
Hemoglobin: 11.5 g/dL — ABNORMAL LOW (ref 12.0–15.0)
Immature Granulocytes: 1 %
Lymphocytes Relative: 29 %
Lymphs Abs: 1.7 10*3/uL (ref 0.7–4.0)
MCH: 27.7 pg (ref 26.0–34.0)
MCHC: 30.3 g/dL (ref 30.0–36.0)
MCV: 91.3 fL (ref 80.0–100.0)
Monocytes Absolute: 0.4 10*3/uL (ref 0.1–1.0)
Monocytes Relative: 6 %
Neutro Abs: 3.3 10*3/uL (ref 1.7–7.7)
Neutrophils Relative %: 56 %
Platelets: 123 10*3/uL — ABNORMAL LOW (ref 150–400)
RBC: 4.15 MIL/uL (ref 3.87–5.11)
RDW: 18.2 % — ABNORMAL HIGH (ref 11.5–15.5)
WBC: 5.8 10*3/uL (ref 4.0–10.5)
nRBC: 0 % (ref 0.0–0.2)

## 2020-11-18 LAB — CBG MONITORING, ED: Glucose-Capillary: 138 mg/dL — ABNORMAL HIGH (ref 70–99)

## 2020-11-18 LAB — APTT: aPTT: 28 seconds (ref 24–36)

## 2020-11-18 LAB — RESP PANEL BY RT-PCR (FLU A&B, COVID) ARPGX2
Influenza A by PCR: NEGATIVE
Influenza B by PCR: NEGATIVE
SARS Coronavirus 2 by RT PCR: NEGATIVE

## 2020-11-18 LAB — AMMONIA: Ammonia: 18 umol/L (ref 9–35)

## 2020-11-18 LAB — LIPASE, BLOOD: Lipase: 35 U/L (ref 11–51)

## 2020-11-18 LAB — PROTIME-INR
INR: 1.1 (ref 0.8–1.2)
Prothrombin Time: 14 seconds (ref 11.4–15.2)

## 2020-11-18 MED ORDER — SODIUM CHLORIDE 0.9 % IV BOLUS
1000.0000 mL | Freq: Once | INTRAVENOUS | Status: AC
Start: 2020-11-18 — End: 2020-11-18
  Administered 2020-11-18: 1000 mL via INTRAVENOUS

## 2020-11-18 MED ORDER — SODIUM CHLORIDE (PF) 0.9 % IJ SOLN
INTRAMUSCULAR | Status: AC
  Filled 2020-11-18: qty 50

## 2020-11-18 MED ORDER — ACETAMINOPHEN 500 MG PO TABS
1000.0000 mg | ORAL_TABLET | Freq: Once | ORAL | Status: AC
  Administered 2020-11-18: 1000 mg via ORAL
  Filled 2020-11-18: qty 2

## 2020-11-18 MED ORDER — PEG 3350-KCL-NA BICARB-NACL 420 G PO SOLR: 4000.0000 mL | Freq: Once | ORAL | Status: DC

## 2020-11-18 MED ORDER — IOHEXOL 300 MG/ML  SOLN
100.0000 mL | Freq: Once | INTRAMUSCULAR | Status: AC | PRN
  Administered 2020-11-18: 100 mL via INTRAVENOUS

## 2020-11-18 MED ORDER — SODIUM ZIRCONIUM CYCLOSILICATE 10 G PO PACK
10.0000 g | PACK | Freq: Once | ORAL | Status: AC
  Administered 2020-11-18: 10 g via ORAL
  Filled 2020-11-18: qty 1

## 2020-11-18 MED ORDER — ONDANSETRON HCL 4 MG/2ML IJ SOLN
4.0000 mg | Freq: Once | INTRAMUSCULAR | Status: AC
Start: 2020-11-18 — End: 2020-11-18
  Administered 2020-11-18: 4 mg via INTRAVENOUS
  Filled 2020-11-18: qty 2

## 2020-11-18 MED ORDER — GOLYTELY 236 G PO SOLR: ORAL | 0 refills | Status: DC

## 2020-11-18 MED ORDER — ONDANSETRON 4 MG PO TBDP: 4.0000 mg | ORAL_TABLET | Freq: Three times a day (TID) | ORAL | 0 refills | Status: DC | PRN

## 2020-11-18 NOTE — ED Provider Notes (Signed)
[facility] DEPT Provider Note   CSN: [id number] Arrival date & time: 11/18/20  0702     History Chief Complaint  Patient presents with   Headache   Emesis   Constipation   drain issues    Kim Hicks is a 55 y.o. female with PMHx HTN, HLD, Diabetes, NASH cirrhosis, and hx complicated partial cholecystectomy (07/2019) with ensuing biliary leak s/p percutaneous biliary drain who presents to the ED today with multiple complaints.   Pt reports that she has not had a bowel movement in over 3 weeks. She states her biliary stent has migrated to her rectum and she is concerned this is why she has not had a solid BM. She has tried multiple OTC medications including metamucil, dulcolax, and fleets enema without relief. She is still passing gas. She began feeling nauseated and vomiting yesterday prompting ED visit today.   Pt also complains of a gradual onset, constant, diffuse, headache. She reports similar issues in February of this year when an infection "spread to her brain" and she had to be in the ICU. Per chart review pt was admitted for post ERCP pancreatitis. While in the hospital she was noted to be obtunded suspected from pain medication and given Narcan. During this pt unfortunately aspirated and became hypoxic; given IV vasopressors for septic shock from underlying aspiration pneumonia and was transferred to the ICU at that time. Pt does have a hx of migraine headaches however states this feels "different." She also mentions that her husband feels like she is more confused than normal; has been compliant with her lactulose.   She mentions recently that she flushed her biliary drain 2 days ago and normal saline returned from around her insertion site which is not typical.   Per chart review: Pt was hospitalized on 06/10 with potential plans for an additional percutaneous biliary drain as an intermediate step towards potential ultimate biliary bypass due to  progression of output via her existing percutaneous biliary drain as well as migration of her biliary duct stent in spite of 6 prior ERCPs. It appears she left AGAINST MEDICAL ADVICE the following day.   The history is provided by the patient and medical records.      Past Medical History:  Diagnosis Date   Anxiety    Cataract    REMOVED BOTH   Cirrhosis of liver (HCC)    Common migraine with intractable migraine 08/07/2020   Depression    Diabetes mellitus without complication (HCC)    Dysrhythmia    Edema    Family history of adverse reaction to anesthesia    mother had trouble waking up after surgery   Foot fracture, left    Headache    MIGRAINES   Hyperlipidemia    Hypertension    Neuromuscular disorder (HCC)    NEUROPATHY-both hands and feet   On supplemental oxygen by nasal cannula    2L at bedtime only NO CPAP JUST 02 PER PT   Oxygen deficiency    Palpitations    Wheezing     Patient Active Problem List   Diagnosis Date Noted   On home oxygen therapy 11/05/2020   Biliary drain displacement 11/01/2020   Iron deficiency anemia 10/03/2020   Common migraine with intractable migraine 86/[birth year]1950   Metabolic encephalopathy    Septic shock ([place]) 07/06/2020   Acute respiratory failure with hypoxemia ([place]) 07/06/2020   Pressure injury of skin 07/04/2020   Post-ERCP acute pancreatitis 06/27/2020   Pancreatitis 06/27/2020  Abscess of gallbladder    Migration of biliary stent    Malnutrition of moderate degree 05/23/2020   Abdominal pain 05/21/2020   Biloma 05/20/2020   Biliary sludge    Complicated UTI (urinary tract infection) 03/11/2020   Encounter for removal of biliary stent    Diabetic peripheral neuropathy ([place]) 09/14/2019   Gait disturbance 09/14/2019   NASH (nonalcoholic steatohepatitis) 08/21/2019   Bile leak    Other cirrhosis of liver ([place]) 08/04/2019   Major depressive disorder, recurrent episode, severe, with psychotic behavior ([place]) 03/25/2011    DYSPHAGIA UNSPECIFIED 03/04/2009   GERD 02/18/2009   PERSISTENT VOMITING 02/18/2009   CHEST PAIN 02/18/2009   NAUSEA AND VOMITING 02/18/2009   Dehydration 07/19/2007   GASTROENTERITIS 07/19/2007   FIBROIDS, UTERUS 05/02/2007   Depression 05/02/2007   MIGRAINE HEADACHE 05/02/2007   Hypertension 05/02/2007   HEMORRHOIDS 05/02/2007   Chronic constipation 05/02/2007   IBS 05/02/2007   PSORIASIS 05/02/2007   PITUITARY NEOPLASM, HX OF 05/02/2007   LIVER FUNCTION TESTS, ABNORMAL, HX OF 05/02/2007   Diabetes mellitus without complication ([place]) 15/40/0867   Hyperlipidemia associated with type 2 diabetes mellitus ([place]) 02/04/2007   SINUSITIS, ACUTE 02/04/2007   Abdominal pain, right upper quadrant 02/04/2007   Hyperlipidemia 02/04/2007    Past Surgical History:  Procedure Laterality Date   ABDOMINAL HYSTERECTOMY     one ovary left   BILIARY STENT PLACEMENT N/A 08/07/2019   Procedure: BILIARY STENT PLACEMENT;  Surgeon: [name], MD;  Location: [facility];  Service: Endoscopy;  Laterality: N/A;   BILIARY STENT PLACEMENT N/A 11/07/2019   Procedure: BILIARY STENT PLACEMENT;  Surgeon: [name], MD;  [facility]: [facility];  Service: Endoscopy;  Laterality: N/A;   BILIARY STENT PLACEMENT N/A 05/13/2020   Procedure: BILIARY STENT PLACEMENT;  Surgeon: [name], MD;  [facility];  Service: Endoscopy;  Laterality: N/A;   BILIARY STENT PLACEMENT N/A 05/27/2020   Procedure: BILIARY STENT PLACEMENT;  Surgeon: [name], MD;  [facility];  Service: Endoscopy;  Laterality: N/A;   BILIARY STENT PLACEMENT N/A 06/26/2020   Procedure: BILIARY STENT PLACEMENT;  Surgeon: [name], MD;  [facility]: [facility];  Service: Gastroenterology;  Laterality: N/A;   BILIARY STENT PLACEMENT N/A 09/30/2020   Procedure: BILIARY STENT PLACEMENT;  Surgeon: [name], MD;  [facility];  Service: Gastroenterology;  Laterality: N/A;    BIOPSY  06/26/2020   Procedure: BIOPSY;  Surgeon: [name], MD;  Location: [facility];  Service: Gastroenterology;;   CATARACT EXTRACTION W/PHACO Right 01/11/2018   Procedure: CATARACT EXTRACTION PHACO AND INTRAOCULAR LENS PLACEMENT ([place]);  Surgeon: [name], MD;  Location: [facility];  Service: Ophthalmology;  Laterality: Right;  [place] 00:57.5 AP% 15.0 CDE 8.61 Fluid Pack Lot # [id number]   CATARACT EXTRACTION W/PHACO Left 02/15/2018   Procedure: CATARACT EXTRACTION PHACO AND INTRAOCULAR LENS PLACEMENT ([place]);  Surgeon: [name], MD;  Location: [facility];  Service: Ophthalmology;  Laterality: Left;  [place] 00:51 AP% 13.2 CDE 6.82 Fluid p ack lot # [id number]   CHOLECYSTECTOMY N/A 08/04/2019   Procedure: LAPAROSCOPIC CHOLECYSTECTOMY;  Surgeon: [name], MD;  [facility]: [facility];  Service: General;  Laterality: N/A;   COLONOSCOPY     ENDOSCOPIC RETROGRADE CHOLANGIOPANCREATOGRAPHY (ERCP) WITH PROPOFOL N/A 11/07/2019   Procedure: ENDOSCOPIC RETROGRADE CHOLANGIOPANCREATOGRAPHY (ERCP) WITH PROPOFOL;  Surgeon: [name], MD;  [facility];  Service: Endoscopy;  Laterality: N/A;   ENDOSCOPIC RETROGRADE CHOLANGIOPANCREATOGRAPHY (ERCP) WITH PROPOFOL N/A 05/13/2020  Procedure: ENDOSCOPIC RETROGRADE CHOLANGIOPANCREATOGRAPHY (ERCP) WITH PROPOFOL;  Surgeon: [name], MD;  [facility];  Service: Endoscopy;  Laterality: N/A;   ENDOSCOPIC RETROGRADE CHOLANGIOPANCREATOGRAPHY (ERCP) WITH PROPOFOL N/A 06/26/2020   Procedure: ENDOSCOPIC RETROGRADE CHOLANGIOPANCREATOGRAPHY (ERCP) WITH PROPOFOL;  Surgeon: [name], MD;  [facility];  Service: Gastroenterology;  Laterality: N/A;   ENDOSCOPIC RETROGRADE CHOLANGIOPANCREATOGRAPHY (ERCP) WITH PROPOFOL N/A 09/30/2020   Procedure: ENDOSCOPIC RETROGRADE CHOLANGIOPANCREATOGRAPHY (ERCP) WITH PROPOFOL;  Surgeon: [name], MD;  [facility];  Service: Gastroenterology;   Laterality: N/A;   ERCP N/A 08/07/2019   Procedure: ENDOSCOPIC RETROGRADE CHOLANGIOPANCREATOGRAPHY (ERCP);  Surgeon: [name], MD;  Location: [facility];  Service: Endoscopy;  Laterality: N/A;   ERCP N/A 05/27/2020   Procedure: ENDOSCOPIC RETROGRADE CHOLANGIOPANCREATOGRAPHY (ERCP);  Surgeon: [name], MD;  Location: [facility];  Service: Endoscopy;  Laterality: N/A;   ESOPHAGOGASTRODUODENOSCOPY N/A 06/26/2020   Procedure: ESOPHAGOGASTRODUODENOSCOPY (EGD);  Surgeon: [name]., MD;  Location: [facility];  Service: Gastroenterology;  Laterality: N/A;   IR CHOLANGIOGRAM EXISTING TUBE  10/16/2020   IR EXCHANGE BILIARY DRAIN  07/10/2020   IR EXCHANGE BILIARY DRAIN  08/28/2020   IR RADIOLOGIST EVAL & MGMT  06/20/2020   RCR Bilateral    REMOVAL OF STONES  08/07/2019   Procedure: REMOVAL OF STONES;  Surgeon: [name], MD;  [facility];  Service: Endoscopy;;  balloon sweep, no stones   REMOVAL OF STONES  05/27/2020   Procedure: REMOVAL OF STONES;  Surgeon: [name], MD;  [facility];  Service: Endoscopy;;   REMOVAL OF STONES  06/26/2020   Procedure: REMOVAL OF STONES;  Surgeon: [name]., MD;  Location: [facility];  Service: Gastroenterology;;   REMOVAL OF STONES  09/30/2020   Procedure: REMOVAL OF STONES;  Surgeon: [name]., MD;  Location: [facility];  Service: Gastroenterology;;   [name]  08/07/2019   Procedure: SPHINCTEROTOMY;  Surgeon: [name], MD;  [facility];  Service: Endoscopy;;   STENT REMOVAL  11/07/2019   Procedure: STENT REMOVAL;  Surgeon: [name], MD;  [facility];  Service: Endoscopy;;   STENT REMOVAL  05/13/2020   Procedure: STENT REMOVAL;  Surgeon: [name], MD;  [facility];  Service: Endoscopy;;   STENT REMOVAL  05/27/2020   Procedure: STENT REMOVAL;  Surgeon: [name], MD;  [facility];  Service: Endoscopy;;    STENT REMOVAL  06/26/2020   Procedure: STENT REMOVAL;  Surgeon: [name]., MD;  Location: [facility];  Service: Gastroenterology;;   Lavell Islam REMOVAL  09/30/2020   Procedure: STENT REMOVAL;  Surgeon: [name]., MD;  [facility];  Service: Gastroenterology;;     OB History   No obstetric history on file.     Family History  Problem Relation Age of Onset   Diabetes Mother    Heart failure Mother    Depression Mother    Colon cancer Neg Hx    Stomach cancer Neg Hx    Esophageal cancer Neg Hx    Pancreatic cancer Neg Hx    Colon polyps Neg Hx    Rectal cancer Neg Hx     Social History   Tobacco Use   Smoking status: Never   Smokeless tobacco: Never  Vaping Use   Vaping Use: Never used  Substance Use Topics   Alcohol use: Never   Drug use: Never    Home Medications Prior to Admission medications  Medication Sig Start Date End Date Taking? Authorizing Provider  acetaminophen (TYLENOL) 500 MG tablet Take 1,000 mg by mouth every 6 (six) hours as needed for moderate pain.   Yes [provider]  AIMOVIG 140 MG/ML SOAJ Inject 140 mg into the skin every 30 (thirty) days. 09/10/20  Yes [provider]  ALPRAZolam Duanne Moron) 0.5 MG tablet Take 0.5 mg by mouth 3 (three) times daily. 08/30/20  Yes [provider]  brexpiprazole (REXULTI) 2 MG TABS tablet Take 2 mg by mouth at bedtime.   Yes [provider]  cyclobenzaprine (FLEXERIL) 5 MG tablet Take 5 mg by mouth 3 (three) times daily as needed. 11/15/20  Yes [provider]  Dulaglutide 4.5 MG/0.5ML SOPN Inject 4.5 mg into the skin every Monday. 12/20/18  Yes [provider]  Empagliflozin-metFORMIN HCl ER (SYNJARDY XR) 25-1000 MG TB24 Take 1 tablet by mouth daily before breakfast.  11/15/18  Yes [provider]  ezetimibe (ZETIA) 10 MG tablet Take 10 mg by mouth daily. 04/25/20  Yes [provider]  Insulin Glargine (BASAGLAR KWIKPEN)  100 UNIT/ML Inject 40 Units into the skin at bedtime. 03/20/20  Yes [provider]  lactulose (CHRONULAC) 10 GM/15ML solution TAKE 15 MLS (10 G TOTAL) BY MOUTH 3 (THREE) TIMES DAILY AS NEEDED FOR MILD CONSTIPATION. Patient taking differently: Take 10 g by mouth 3 (three) times daily. 10/01/20  Yes [name], MD  linaclotide [facility]) 290 MCG CAPS capsule Take 1 capsule (290 mcg total) by mouth daily before breakfast. 08/30/20  Yes [name], MD  lisinopril (ZESTRIL) 40 MG tablet Take 40 mg by mouth every morning. 09/27/20  Yes [provider]  meloxicam (MOBIC) 15 MG tablet Take 1 tablet by mouth daily. 10/15/20  Yes [provider]  metFORMIN (GLUCOPHAGE) 1000 MG tablet Take 1,000 mg by mouth at bedtime. 02/27/20  Yes [provider]  methylPREDNISolone (MEDROL DOSEPAK) 4 MG TBPK tablet Take 4 mg by mouth daily. 11/15/20 11/21/20 Yes [provider]  ondansetron (ZOFRAN ODT) 4 MG disintegrating tablet Take 1 tablet (4 mg total) by mouth every 8 (eight) hours as needed for nausea or vomiting. 11/18/20  Yes Jara Feider, PA-C  ondansetron (ZOFRAN-ODT) 4 MG disintegrating tablet Take 4 mg by mouth every 8 (eight) hours as needed for nausea or vomiting.   Yes [provider]  pantoprazole (PROTONIX) 40 MG tablet Take 40 mg by mouth daily. 08/19/20  Yes [provider]  prazosin (MINIPRESS) 2 MG capsule Take 4 mg by mouth at bedtime.   Yes [provider]  pregabalin (LYRICA) 100 MG capsule One capsule in the morning and midday, 2 in the evening Patient taking differently: Take 100 mg by mouth See admin instructions. Take 100 mg   in the morning and midday, 200 mg in the evening 08/07/20  Yes Kathrynn Ducking, MD  QUEtiapine (SEROQUEL) 400 MG tablet Take 0.5 tablets (200 mg total) by mouth at bedtime. Patient taking differently: Take 800 mg by mouth at bedtime. 07/13/20  Yes [place] Eric J, DO  QUEtiapine (SEROQUEL) 50 MG  tablet Take 100 mg by mouth every morning. 08/30/20  Yes [provider]  simvastatin (ZOCOR) 10 MG tablet Take 10 mg by mouth daily. 12/13/17  Yes [provider]  sulfamethoxazole-trimethoprim (BACTRIM DS) 800-160 MG tablet Take 1 tablet by mouth 2 (two) times daily. 11/04/20  Yes [provider]  traMADol ([name]) 50 MG tablet Take 50 mg by mouth 2 (two) times  daily as needed for moderate pain. 08/24/20  Yes [provider]  vortioxetine HBr ([name]) 20 MG TABS tablet Take 20 mg by mouth at bedtime.   Yes [provider]  ziprasidone ([name]) 20 MG capsule Take 20 mg by mouth at bedtime. 08/30/20  Yes [provider]  iron polysaccharides (NIFEREX) 150 MG capsule Take 1 capsule (150 mg total) by mouth daily. Patient not taking: No sig reported 10/04/20   Eugenie Filler, MD    Allergies    Dilaudid [hydromorphone] and Talwin [pentazocine]  Review of Systems   Review of Systems  Constitutional:  Negative for fever.  Respiratory:  Negative for shortness of breath.   Cardiovascular:  Negative for chest pain.  Gastrointestinal:  Positive for abdominal pain, constipation, nausea and vomiting.  Musculoskeletal:  Negative for neck pain and neck stiffness.  Neurological:  Positive for headaches. Negative for speech difficulty, weakness and numbness.  Psychiatric/Behavioral:  Positive for confusion.   All other systems reviewed and are negative.  Physical Exam Updated Vital Signs BP (!) 157/86 (BP Location: Left Arm)   Pulse (!) 102   Temp 97.7 F (36.5 C) (Oral)   Resp 16   Ht 5' 5"  (1.651 m)   Wt 78 kg   SpO2 99%   BMI 28.62 kg/m   Physical Exam Vitals and nursing note reviewed.  Constitutional:      Appearance: She is obese. She is not ill-appearing or diaphoretic.  HENT:     Head: Normocephalic and atraumatic.  Eyes:     Conjunctiva/sclera: Conjunctivae normal.  Neck:     Meningeal: Brudzinski's sign and Kernig's sign  absent.  Cardiovascular:     Rate and Rhythm: Normal rate and regular rhythm.     Heart sounds: Normal heart sounds.  Pulmonary:     Effort: Pulmonary effort is normal.     Breath sounds: Normal breath sounds. No wheezing, rhonchi or rales.  Abdominal:     General: Bowel sounds are decreased. There is distension.     Tenderness: There is generalized abdominal tenderness. There is no guarding or rebound.  Musculoskeletal:     Cervical back: Neck supple.  Skin:    General: Skin is warm and dry.  Neurological:     Mental Status: She is alert.     Comments: Alert and oriented to self, place, time and event.   Speech is fluent, clear without dysarthria or dysphasia.   Strength 5/5 in upper/lower extremities   Sensation intact in upper/lower extremities   Normal gait.  Negative Romberg. No pronator drift.  Normal finger-to-nose and feet tapping.  CN I not tested  CN II grossly intact visual fields bilaterally. Did not visualize posterior eye.  CN III, IV, VI PERRLA and EOMs intact bilaterally  CN V Intact sensation to sharp and light touch to the face  CN VII facial movements symmetric  CN VIII not tested  CN IX, X no uvula deviation, symmetric rise of soft palate  CN XI 5/5 SCM and trapezius strength bilaterally  CN XII Midline tongue protrusion, symmetric L/R movements      ED Results / Procedures / Treatments   Labs (all labs ordered are listed, but only abnormal results are displayed) Labs Reviewed  COMPREHENSIVE METABOLIC PANEL - Abnormal; Notable for the following components:      Result Value   Potassium 5.4 (*)    CO2 19 (*)    Glucose, Bld 149 (*)    BUN 31 (*)  AST 45 (*)    Alkaline Phosphatase 163 (*)    All other components within normal limits  CBC WITH DIFFERENTIAL/PLATELET - Abnormal; Notable for the following components:   Hemoglobin 11.5 (*)    RDW 18.2 (*)    Platelets 123 (*)    All other components within normal limits  URINALYSIS, ROUTINE W  REFLEX MICROSCOPIC - Abnormal; Notable for the following components:   Glucose, UA >=500 (*)    Leukocytes,Ua TRACE (*)    Bacteria, UA RARE (*)    All other components within normal limits  CBG MONITORING, ED - Abnormal; Notable for the following components:   Glucose-Capillary 138 (*)    All other components within normal limits  RESP PANEL BY RT-PCR (FLU A&B, COVID) ARPGX2  LIPASE, BLOOD  AMMONIA  PROTIME-INR  APTT    EKG EKG Interpretation  Date/Time:  Monday November 18 2020 08:51:00 EDT Ventricular Rate:  94 PR Interval:  196 QRS Duration: 94 QT Interval:  370 QTC Calculation: 462 R Axis:   -2 Text Interpretation: Normal sinus rhythm Low voltage QRS Cannot rule out Anterior infarct , age undetermined Abnormal ECG axis normal no acute ischemia Confirmed by Lorre Munroe ([id number]) on 11/18/2020 8:52:09 AM  Radiology CT Head Wo Contrast  Result Date: 11/18/2020 CLINICAL DATA:  Headache with nausea and fever EXAM: CT HEAD WITHOUT CONTRAST TECHNIQUE: Contiguous axial images were obtained from the base of the skull through the vertex without intravenous contrast. COMPARISON:  July 07, 2020 FINDINGS: Brain: Ventricles and sulci remain borderline prominent for age. No intracranial mass hemorrhage, extra-axial fluid collection, or midline shift. Brain parenchyma appears unremarkable. No appreciable acute infarct. Vascular: No hyperdense vessel.  No evident vascular calcification. Skull: Bony calvarium appears intact. Sinuses/Orbits: Visualized paranasal sinuses are clear. Orbits appear symmetric bilaterally. Other: Mastoid air cells are clear. IMPRESSION: Mild ventricular and sulcal prominence for age. Normal appearing brain parenchyma. No acute infarct. No mass or hemorrhage. Electronically Signed   By: Lowella Grip III M.D.   On: 11/18/2020 09:59   CT Abdomen Pelvis W Contrast  Result Date: 11/18/2020 CLINICAL DATA:  Right-sided abdominal pain for 1 year, initial encounter EXAM: CT  ABDOMEN AND PELVIS WITH CONTRAST TECHNIQUE: Multidetector CT imaging of the abdomen and pelvis was performed using the standard protocol following bolus administration of intravenous contrast. CONTRAST:  149m OMNIPAQUE IOHEXOL 300 MG/ML  SOLN COMPARISON:  10/01/2020 FINDINGS: Lower chest: No acute abnormality. Hepatobiliary: Mild fatty infiltration of the liver is noted. Mild nodularity of the liver is again identified consistent with underlying cirrhosis. Drainage catheter in the gallbladder fossa is again identified. Changes of prior cholecystectomy are noted. Plastic stent remains in the distal aspect of the common bile duct extending into the duodenum. Previously seen metallic stent has migrated Pancreas: Unremarkable. No pancreatic ductal dilatation or surrounding inflammatory changes. Spleen: Normal in size without focal abnormality. Adrenals/Urinary Tract: Adrenal glands are within normal limits. Kidneys are well visualized with a normal enhancement pattern. No renal calculi or obstructive changes are seen. The bladder is within normal limits. Stomach/Bowel: Scattered fecal material is noted throughout the colon. No obstructive or inflammatory changes are noted. Appendix is within normal limits. Stomach and small bowel are unremarkable. Vascular/Lymphatic: No significant vascular findings are present. No enlarged abdominal or pelvic lymph nodes. Reproductive: Status post hysterectomy. No adnexal masses. Other: No abdominal Hicks hernia or abnormality. No abdominopelvic ascites. Musculoskeletal: No acute or significant osseous findings. IMPRESSION: Mild changes of colonic constipation. Gallbladder fossa drain and CBD  stent are again identified and stable. Stable changes in the liver consistent with fatty infiltration and cirrhosis. Electronically Signed   By: Inez Catalina M.D.   On: 11/18/2020 10:12    Procedures Procedures   Medications Ordered in ED Medications  ondansetron (ZOFRAN) injection 4 mg (4  mg Intravenous Given 11/18/20 0805)  sodium chloride 0.9 % bolus 1,000 mL (0 mLs Intravenous Stopped 11/18/20 1049)  sodium chloride (PF) 0.9 % injection (  Given 11/18/20 0947)  iohexol (OMNIPAQUE) 300 MG/ML solution 100 mL (100 mLs Intravenous Contrast Given 11/18/20 0952)  acetaminophen (TYLENOL) tablet 1,000 mg (1,000 mg Oral Given 11/18/20 1042)  sodium zirconium cyclosilicate (LOKELMA) packet 10 g (10 g Oral Given 11/18/20 1042)    ED Course  I have reviewed the triage vital signs and the nursing notes.  Pertinent labs & imaging results that were available during my care of the patient were reviewed by me and considered in my medical decision making (see chart for details).  Clinical Course as of 11/18/20 1355  Mon Nov 18, 2020  0755 Glucose-Capillary(!): 138 [MV]  0828 Hemoglobin(!): 11.5 stable [MV]  0829 Potassium(!): 5.4 [MV]    Clinical Course User Index [MV] Eustaquio Maize, PA-C   MDM Rules/Calculators/A&P                          55 year old female who presents to the ED today with multiple complaints.  Patient has a complicated history of partial cholecystectomy in March 2021 with multiple ERCPs afterwards and biliary stent placement/biliary drain.  She has been constipated for approximately 3 weeks with new onset of nausea and vomiting yesterday.  Also complaining of a headache reports her husband feels like she is confused.  She does also have a history of cirrhosis, currently on lactulose and states that she has been compliant.  On arrival to the ED today patient is afebrile, nontachypneic.  She is nontoxic-appearing.  Mildly tachycardic at 102.  Blood pressure elevated 157/86.  On my exam her abdomen does appear distended, decreased bowel sounds.  She is still passing gas however given complicated surgical history concern for possible SBO at this time.  Will need CT scan.  She is also complaining of a headache, does have history of migraine headaches however states that this  feels different than previous headaches.  She has no focal neurodeficits on exam today, no meningeal signs.  However given new worsening headaches we will plan for CT head as well as CT abdomen and pelvis.  Will obtain labs including CBC, CMP, lipase, ammonia level, urinalysis.  Patient was also recently admitted to the hospital on 06/10 with plans for gastroenterology/general surgery to evaluate patient for possible additional biliary drain.  She unfortunately left AGAINST MEDICAL ADVICE at that time.    CBC without leukocytosis. Hgb stable at 11.5. Platelets slightly decreased at 123; will add on coags at this time given hx of cirrhosis.  CMP with glucose 149, bicarb 19, no gap. Not consistent with DKA. BUN slightly elevated 31 however creatinine 0.89. Potassium 5.4; will obtain EKG at this time. Will hold off on lokelma pending CT scan.  Lipase 35 Ammonia 18 Coags unremarkable COVID and flu negative  CT Head negative CT Abdomen/Pelvis IMPRESSION:  Mild changes of colonic constipation.     Gallbladder fossa drain and CBD stent are again identified and  stable.     Stable changes in the liver consistent with fatty infiltration and  cirrhosis.  Given constipation findings will provide enema and reevaluate. This does seem to be patient's main chief complaint today. If able to have a proper BM in the ED will discharge home with close outpatient gastroenterology follow up. Drain and stent stable per previous imaging.   Nursing staff has also successfully flushed biliary drain without any leakage appreciated.   Pt had no success with soap suds enema in the ED. Given this with previous surgical history consulted gastroenterology [name] NP who evaluated pt's imaging today; pt did have previous known stent in rectum which seems to have passed. Does not seem like this is causing an obstruction today. Given known issues with biliary leak with plans to see general surgery in 2 weeks they  recommend discharging with oral Rx for constipation and antiemetics. They will send medication to pharmacy. Appreciate their involvement. Pt stable for discharge at this time.   This note was prepared using Dragon voice recognition software and may include unintentional dictation errors due to the inherent limitations of voice recognition software.  Final Clinical Impression(s) / ED Diagnoses Final diagnoses:  Constipation, unspecified constipation type    Rx / DC Orders ED Discharge Orders          Ordered    ondansetron (ZOFRAN ODT) 4 MG disintegrating tablet  Every 8 hours PRN        11/18/20 1310             Discharge Instructions      Please pick up medication at the pharmacy and take as prescribed  Follow up with general surgery as scheduled in 2 weeks time  Return to the ED for any new/worsening symptoms        Eustaquio Maize, PA-C 11/18/20 1355    Arnaldo Natal, MD 11/18/20 1459

## 2020-11-18 NOTE — Progress Notes (Signed)
In ED with constipation

## 2020-11-18 NOTE — Discharge Instructions (Addendum)
Please pick up medication at the pharmacy and take as prescribed  Follow up with general surgery as scheduled in 2 weeks time  Return to the ED for any new/worsening symptoms

## 2020-11-18 NOTE — ED Notes (Signed)
Pt ambulates independently, steady gait

## 2020-11-18 NOTE — ED Notes (Signed)
Brought bedside commode to bedside, administered soap sud enema (pt states she has received enemas before and is familiar w procedure). No complications during administration, pt sitting on bedside commode, husband at bedside, will check on pt shortly and see if pt had bm.

## 2020-11-18 NOTE — ED Notes (Signed)
Brought pt warm blankets

## 2020-11-18 NOTE — ED Notes (Signed)
Unable to obtain VS at this time. Pt is attempting to use bedside commode. RN @ bedside.

## 2020-11-18 NOTE — ED Notes (Signed)
Flushed pt's biliary drain, drain flushes easily, no resistance and no leaking around drain site. Provider made aware

## 2020-11-18 NOTE — ED Triage Notes (Signed)
Patient reports no BM in 3 weeks. Patient also c/o emesis since yesterday and a headache.  Patient reports that she had surgery in March 2021 and has had a  JP  drain since. Patient states when she went to flush it  2 days ago she had saline come back from around the insertion site.

## 2020-11-18 NOTE — ED Notes (Signed)
Pt did not have bm after enema, provider made aware

## 2020-11-19 ENCOUNTER — Other Ambulatory Visit: Payer: Self-pay

## 2020-11-19 DIAGNOSIS — R1310 Dysphagia, unspecified: Secondary | ICD-10-CM

## 2020-11-19 DIAGNOSIS — D509 Iron deficiency anemia, unspecified: Secondary | ICD-10-CM

## 2020-11-19 MED ORDER — NA SULFATE-K SULFATE-MG SULF 17.5-3.13-1.6 GM/177ML PO SOLN: ORAL | 0 refills | Status: DC

## 2020-11-19 MED ORDER — LACTULOSE 10 GM/15ML PO SOLN: 10.0000 g | Freq: Three times a day (TID) | ORAL | 1 refills | Status: DC

## 2020-11-19 NOTE — Telephone Encounter (Signed)
Patient notified of endo/colon at [facility] on 12/02/20 10:45.  Instructions mailed to the patient

## 2020-11-26 ENCOUNTER — Other Ambulatory Visit: Payer: Self-pay

## 2020-11-26 ENCOUNTER — Ambulatory Visit (HOSPITAL_COMMUNITY)
Admission: RE | Admit: 2020-11-26 | Discharge: 2020-11-26 | Disposition: A | Discharge status: Home / Self Care | Payer: Medicare Other | Source: Ambulatory Visit | Attending: Diagnostic Radiology | Admitting: Diagnostic Radiology

## 2020-11-26 ENCOUNTER — Other Ambulatory Visit (HOSPITAL_COMMUNITY): Payer: Self-pay | Admitting: Diagnostic Radiology

## 2020-11-26 ENCOUNTER — Other Ambulatory Visit (HOSPITAL_COMMUNITY): Payer: Self-pay | Admitting: Interventional Radiology

## 2020-11-26 DIAGNOSIS — Z9049 Acquired absence of other specified parts of digestive tract: Secondary | ICD-10-CM

## 2020-11-26 DIAGNOSIS — Z4803 Encounter for change or removal of drains: Secondary | ICD-10-CM | POA: Diagnosis not present

## 2020-11-26 HISTORY — PX: IR EXCHANGE BILIARY DRAIN: IMG6046

## 2020-11-26 MED ORDER — IOHEXOL 300 MG/ML  SOLN
15.0000 mL | Freq: Once | INTRAMUSCULAR | Status: AC | PRN
  Administered 2020-11-26: 4 mL

## 2020-11-26 NOTE — Progress Notes (Signed)
Attempted to obtain medical history via telephone, unable to reach at this time. I left a voicemail to return pre surgical testing department's phone call.  

## 2020-11-28 ENCOUNTER — Other Ambulatory Visit: Payer: Self-pay

## 2020-12-02 ENCOUNTER — Encounter (HOSPITAL_COMMUNITY): Payer: Self-pay | Admitting: Gastroenterology

## 2020-12-02 ENCOUNTER — Ambulatory Visit (HOSPITAL_COMMUNITY)
Admission: RE | Admit: 2020-12-02 | Discharge: 2020-12-02 | Disposition: A | Discharge status: Home / Self Care | Payer: Medicare Other | Attending: Gastroenterology | Admitting: Gastroenterology

## 2020-12-02 ENCOUNTER — Ambulatory Visit (HOSPITAL_COMMUNITY): Payer: Medicare Other | Admitting: Anesthesiology

## 2020-12-02 ENCOUNTER — Encounter (HOSPITAL_COMMUNITY)
Admission: RE | Disposition: A | Discharge status: Home / Self Care | Payer: Self-pay | Source: Home / Self Care | Attending: Gastroenterology

## 2020-12-02 ENCOUNTER — Other Ambulatory Visit: Payer: Self-pay

## 2020-12-02 DIAGNOSIS — E119 Type 2 diabetes mellitus without complications: Secondary | ICD-10-CM | POA: Insufficient documentation

## 2020-12-02 DIAGNOSIS — Z980 Intestinal bypass and anastomosis status: Secondary | ICD-10-CM | POA: Diagnosis not present

## 2020-12-02 DIAGNOSIS — R1310 Dysphagia, unspecified: Secondary | ICD-10-CM | POA: Diagnosis not present

## 2020-12-02 DIAGNOSIS — Z794 Long term (current) use of insulin: Secondary | ICD-10-CM | POA: Diagnosis not present

## 2020-12-02 DIAGNOSIS — K7460 Unspecified cirrhosis of liver: Secondary | ICD-10-CM | POA: Diagnosis not present

## 2020-12-02 DIAGNOSIS — D128 Benign neoplasm of rectum: Secondary | ICD-10-CM | POA: Diagnosis not present

## 2020-12-02 DIAGNOSIS — K3189 Other diseases of stomach and duodenum: Secondary | ICD-10-CM

## 2020-12-02 DIAGNOSIS — Z9049 Acquired absence of other specified parts of digestive tract: Secondary | ICD-10-CM | POA: Insufficient documentation

## 2020-12-02 DIAGNOSIS — Z79899 Other long term (current) drug therapy: Secondary | ICD-10-CM | POA: Insufficient documentation

## 2020-12-02 DIAGNOSIS — K5909 Other constipation: Secondary | ICD-10-CM | POA: Insufficient documentation

## 2020-12-02 DIAGNOSIS — K5520 Angiodysplasia of colon without hemorrhage: Secondary | ICD-10-CM | POA: Diagnosis not present

## 2020-12-02 DIAGNOSIS — I8510 Secondary esophageal varices without bleeding: Secondary | ICD-10-CM

## 2020-12-02 DIAGNOSIS — K640 First degree hemorrhoids: Secondary | ICD-10-CM | POA: Diagnosis not present

## 2020-12-02 DIAGNOSIS — K766 Portal hypertension: Secondary | ICD-10-CM | POA: Diagnosis not present

## 2020-12-02 DIAGNOSIS — D509 Iron deficiency anemia, unspecified: Secondary | ICD-10-CM | POA: Insufficient documentation

## 2020-12-02 DIAGNOSIS — Z7984 Long term (current) use of oral hypoglycemic drugs: Secondary | ICD-10-CM | POA: Diagnosis not present

## 2020-12-02 DIAGNOSIS — F32A Depression, unspecified: Secondary | ICD-10-CM | POA: Insufficient documentation

## 2020-12-02 DIAGNOSIS — Z978 Presence of other specified devices: Secondary | ICD-10-CM | POA: Insufficient documentation

## 2020-12-02 DIAGNOSIS — F419 Anxiety disorder, unspecified: Secondary | ICD-10-CM | POA: Diagnosis not present

## 2020-12-02 DIAGNOSIS — K621 Rectal polyp: Secondary | ICD-10-CM

## 2020-12-02 HISTORY — DX: Sleep apnea, unspecified: G47.30

## 2020-12-02 HISTORY — PX: POLYPECTOMY: SHX5525

## 2020-12-02 HISTORY — PX: ESOPHAGOGASTRODUODENOSCOPY (EGD) WITH PROPOFOL: SHX5813

## 2020-12-02 HISTORY — PX: HOT HEMOSTASIS: SHX5433

## 2020-12-02 HISTORY — PX: COLONOSCOPY WITH PROPOFOL: SHX5780

## 2020-12-02 LAB — GLUCOSE, CAPILLARY: Glucose-Capillary: 103 mg/dL — ABNORMAL HIGH (ref 70–99)

## 2020-12-02 SURGERY — ESOPHAGOGASTRODUODENOSCOPY (EGD) WITH PROPOFOL
Anesthesia: Monitor Anesthesia Care

## 2020-12-02 MED ORDER — SODIUM CHLORIDE 0.9 % IV SOLN: INTRAVENOUS | Status: DC

## 2020-12-02 MED ORDER — LIDOCAINE 2% (20 MG/ML) 5 ML SYRINGE
INTRAMUSCULAR | Status: DC | PRN
  Administered 2020-12-02: 80 mg via INTRAVENOUS

## 2020-12-02 MED ORDER — PROPOFOL 10 MG/ML IV BOLUS
INTRAVENOUS | Status: DC | PRN
  Administered 2020-12-02 (×2): 20 mg via INTRAVENOUS
  Administered 2020-12-02: 30 mg via INTRAVENOUS

## 2020-12-02 MED ORDER — LACTATED RINGERS IV SOLN: INTRAVENOUS | Status: DC | PRN

## 2020-12-02 MED ORDER — LACTATED RINGERS IV SOLN
INTRAVENOUS | Status: AC | PRN
  Administered 2020-12-02: 1000 mL via INTRAVENOUS

## 2020-12-02 MED ORDER — PROPOFOL 500 MG/50ML IV EMUL
INTRAVENOUS | Status: DC | PRN
  Administered 2020-12-02: 125 ug/kg/min via INTRAVENOUS

## 2020-12-02 MED ORDER — PROPOFOL 1000 MG/100ML IV EMUL
INTRAVENOUS | Status: AC
  Filled 2020-12-02: qty 100

## 2020-12-02 SURGICAL SUPPLY — 25 items

## 2020-12-02 NOTE — Anesthesia Postprocedure Evaluation (Signed)
Anesthesia Post Note  Patient: Kim Hicks  Procedure(s) Performed: ESOPHAGOGASTRODUODENOSCOPY (EGD) WITH PROPOFOL COLONOSCOPY WITH PROPOFOL HOT HEMOSTASIS (ARGON PLASMA COAGULATION/BICAP) POLYPECTOMY     Patient location during evaluation: PACU Anesthesia Type: MAC Level of consciousness: awake and alert Pain management: pain level controlled Vital Signs Assessment: post-procedure vital signs reviewed and stable Respiratory status: spontaneous breathing, nonlabored ventilation and respiratory function stable Cardiovascular status: stable and blood pressure returned to baseline Anesthetic complications: no   No notable events documented.  Last Vitals:  Vitals:   12/02/20 1050 12/02/20 1100  BP: 132/61 (!) 142/70  Pulse: 100 100  Resp: (!) 25 20  Temp:    SpO2: 100% 96%    Last Pain:  Vitals:   12/02/20 1100  TempSrc:   PainSc: 0-No pain                 Audry Pili

## 2020-12-02 NOTE — Anesthesia Preprocedure Evaluation (Addendum)
Anesthesia Evaluation  Patient identified by MRN, date of birth, ID band Patient awake    Reviewed: Allergy & Precautions, NPO status , Patient's Chart, lab work & pertinent test results  History of Anesthesia Complications Negative for: history of anesthetic complications  Airway Mallampati: II  TM Distance: >3 FB Neck ROM: Full    Dental  (+) Dental Advisory Given   Pulmonary sleep apnea and Oxygen sleep apnea ,   Supplemental O2    Pulmonary exam normal        Cardiovascular hypertension, Pt. on medications Normal cardiovascular exam     Neuro/Psych  Headaches, PSYCHIATRIC DISORDERS Anxiety Depression  Neuromuscular disease (neuropathy)    GI/Hepatic Neg liver ROS, GERD  Medicated and Controlled,  Endo/Other  diabetes, Type 2, Oral Hypoglycemic Agents, Insulin Dependent  Renal/GU negative Renal ROS     Musculoskeletal negative musculoskeletal ROS (+)   Abdominal   Peds  Hematology  Plt 123k    Anesthesia Other Findings   Reproductive/Obstetrics                            Anesthesia Physical Anesthesia Plan  ASA: 3  Anesthesia Plan: MAC   Post-op Pain Management:    Induction:   PONV Risk Score and Plan: 2 and Propofol infusion and Treatment may vary due to age or medical condition  Airway Management Planned: Nasal Cannula and Natural Airway  Additional Equipment: None  Intra-op Plan:   Post-operative Plan:   Informed Consent: I have reviewed the patients History and Physical, chart, labs and discussed the procedure including the risks, benefits and alternatives for the proposed anesthesia with the patient or authorized representative who has indicated his/her understanding and acceptance.       Plan Discussed with: CRNA and Anesthesiologist  Anesthesia Plan Comments:        Anesthesia Quick Evaluation

## 2020-12-02 NOTE — Transfer of Care (Signed)
Immediate Anesthesia Transfer of Care Note  Patient: Kim Hicks  Procedure(s) Performed: ESOPHAGOGASTRODUODENOSCOPY (EGD) WITH PROPOFOL COLONOSCOPY WITH PROPOFOL HOT HEMOSTASIS (ARGON PLASMA COAGULATION/BICAP) POLYPECTOMY  Patient Location: PACU  Anesthesia Type:MAC  Level of Consciousness: awake, alert  and oriented  Airway & Oxygen Therapy: Patient Spontanous Breathing and Patient connected to face mask oxygen  Post-op Assessment: Report given to RN and Post -op Vital signs reviewed and stable  Post vital signs: Reviewed and stable  Last Vitals:  Vitals Value Taken Time  BP    Temp    Pulse 96 12/02/20 1038  Resp 21 12/02/20 1038  SpO2 100 % 12/02/20 1038  Vitals shown include unvalidated device data.  Last Pain:  Vitals:   12/02/20 0817  TempSrc: Temporal  PainSc: 5       Patients Stated Pain Goal: 0 (16/10/96 0454)  Complications: No notable events documented.

## 2020-12-02 NOTE — H&P (Signed)
# 55 year old female with complex medical history including cirrhosis, chronic postop bile leak following cholecystectomy March 2021 requiring numerous ERCPs for stent placement / exchanges for occluded or migrated stents.  Persistent cystic duct leak on most recent ERCP 09/30/2020 but after additional stenting the completion cholangiogram was negative for persistent leak. However having JP drainage again (approximately 400 mL daily) and cholangiogram on 5/25 showed persistent communication between the residual cystic duct and biliary tree.   --Dr. Rush Landmark following the ERCP and plan was for bile duct reconstruction if leak persisted. She is awaiting a referral. I will check with Dr  Rush Landmark.   # Iron deficiency anemia.  She has received 2 recent IV iron infusions and is on oral.  She had a colonoscopy for bowel changes January 2021 but prep was poor.   Repeat colonoscopy postponed after she suffered post-op bile leak in March 2021.  Given IDA we need to proceed with colonoscopy. -The risks (including bleeding, perforation, infection, missed lesions, medication reactions and possible hospitalization or surgery if complications occur), benefits, and alternatives to colonoscopy with possible biopsy and possible polypectomy were discussed with the patient and they consent to proceed.    -- 2-day bowel prep  # Dysphagia. R/O stricture. Schedule EGD at time of colonoscopy. The risks (including bleeding, perforation, infection, missed lesions, medication reactions and possible hospitalization or surgery if complications occur), benefits, and alternatives to endoscopy with possible biopsy and possible dilation were discussed with the patient and they consent to proceed.     # Chronic constipation, most likely medication related.  She is having a bowel movement every other day but stools are hard requiring her to strain -- Continue daily Linzess 290 mcg qd -- Continue daily MiraLAX qd -- Continue lactulose 3  times daily -- Continue with good hydration -- Add stool softeners, 2 at bedtime   # Presumed cirrhosis, compensated.  Hepatic serologic work-up negative. Presumed NASH cirrhosis. She had acirrhotic appearing liver noted at time of cholecystectomy. Liver biopsy not done --No esophageal varices or portal gastropathy on upper endoscopy in February 2022   -- No focal liver lesions on CT scan with contrast February 2020 today.  She is due for an annual AFP, can collect that later. --INR normal February 2022 --Continue Lactulose TID. Ammonia elevated in February 2022. --Hep A, B vaccination ordered at her visit here in March 2021, looks like she didn't receive them. Can check antibiotics at some point       HISTORY OF PRESENT ILLNESS     Chief Complaint : hospital follow up   Kim Hicks is a 55 y.o. female known to Dr. Fuller Plan with a complex medical history significant for cirrhosis, iron deficiency anemia, anxiety, depression, hypertension, DM2, laparoscopic cholecystectomy complicated by postop bile leak March 2021.  See PMH below for any additional medical problems.     Kim Hicks had a postop bile leak following cholecystectomy in March 2021.  She still has a perc drain. She has undergone numerous ERCPs for persistent leak and has has several stent placements / exchanges for clogged stents / migrated stents.  Last ERCP was Sep 30, 2020 and remarkable for 4 partially occluded stents, partly migrated but still in the CBD.  Persistent bile leak from cystic duct, and choledocholithiasis . The 4 stents were removed. A plastic stent was placed in the cystic duct.  Sludge and stone removed from the CBD . A fully covered metal stent was placed in the CBD.  Completion cholangiogram at the  end of the study was negative for persistent.  Patient required admission after the procedure for abdominal pain /post ERCP pancreatitis.  She was discharged 10/03/2020, CT scan the day before had shown minimal edema around the  head of the pancreas and hepatoduodenal ligament.      Kim Hicks has chronic anemia, likely multifactorial.  However, she is iron deficient.  Prior to recent hospital discharge she was given a dose of IV iron and then discharged home on oral iron.  She has received a second dose of IV iron this month.  She was made this appointment today to follow-up on recent hospitalization but also so that we can talk about colonoscopy.  She had a colonoscopy in January 2021 for evaluation of bowel changes.  The prep was poor and plans were to repeat the colonoscopy but that was delayed given postop bile leak.      INTERVAL HISTORY: For the first week post hospital discharge Kim Hicks had no drainage in her percutaneous drain collection bag.  However, she has since started draining again and is averaging about 400 mL of cloudy bilious fluid every 24 hours.  Output contained blood last week, patient was sent to IR for evaluation.  Fluoroscopic injection showed that the drain catheter was in stable position.  Contrast communicated with the residual cystic duct and drain in the biliary tree.  Contrast drained through the distal CBD metallic stent into the duodenum.  Biliary system decompressed   Patient tells me that she and her husband spoke with Dr. Rush Landmark after the last ERCP .  If leak persisted then plan was to refer patient to surgery for consideration of bile duct reconstruction.  She says the decision was made not to perform any more ERCP/stent exchanges but wait on surgical evaluation .      Kim Hicks has been taking her oral iron.  She complains of constipation.  Though she is having a bowel movement every other day her stools are hard requiring her to strain.  She is on daily MiraLAX, Linzess to 90 daily, lactulose 3 times daily.  She has discontinued sodas and now drinking five 16 ounce bottles of water every day         Past Medical History:  Diagnosis Date   Anxiety     Cataract     Cirrhosis of liver (HCC)      Common migraine with intractable migraine 08/07/2020   Depression     Diabetes mellitus without complication (HCC)     Dysrhythmia     Edema     Family history of adverse reaction to anesthesia      mother had trouble waking up after surgery   Foot fracture, left     Headache      MIGRAINES   Hyperlipidemia     Hypertension     Neuromuscular disorder (HCC)      NEUROPATHY-both hands and feet   On supplemental oxygen by nasal cannula      2L at bedtime only   Palpitations     Wheezing        Current Medications, Allergies, Past Surgical History, Family History and Social History were reviewed in [employer] record.         Current Outpatient Medications  Medication Sig Dispense Refill   acetaminophen (TYLENOL) 500 MG tablet Take 1,000 mg by mouth every 6 (six) hours as needed for moderate pain.       AIMOVIG 140 MG/ML SOAJ Inject 140 mg into  the skin every 30 (thirty) days.       ALPRAZolam (XANAX) 0.5 MG tablet Take 0.5 mg by mouth 2 (two) times daily as needed for anxiety.       brexpiprazole (REXULTI) 2 MG TABS tablet Take 2 mg by mouth at bedtime.       Dulaglutide 4.5 MG/0.5ML SOPN Inject 4.5 mg into the skin every Monday.       Empagliflozin-metFORMIN HCl ER (SYNJARDY XR) 25-1000 MG TB24 Take 1 tablet by mouth daily before breakfast.       ezetimibe (ZETIA) 10 MG tablet Take 10 mg by mouth daily.       ferrous sulfate 325 (65 FE) MG tablet Take 325 mg by mouth daily with breakfast.       Insulin Glargine (BASAGLAR KWIKPEN) 100 UNIT/ML Inject 40 Units into the skin at bedtime.       iron polysaccharides (NIFEREX) 150 MG capsule Take 1 capsule (150 mg total) by mouth daily. 30 capsule 1   lactulose (CHRONULAC) 10 GM/15ML solution TAKE 15 MLS (10 G TOTAL) BY MOUTH 3 (THREE) TIMES DAILY AS NEEDED FOR MILD CONSTIPATION. 900 mL 0   linaclotide (LINZESS) 290 MCG CAPS capsule Take 1 capsule (290 mcg total) by mouth daily before breakfast. 90 capsule 1    lisinopril (ZESTRIL) 20 MG tablet Take 20 mg by mouth daily.       metFORMIN (GLUCOPHAGE) 1000 MG tablet Take 1,000 mg by mouth at bedtime.       ondansetron (ZOFRAN-ODT) 4 MG disintegrating tablet Take 4 mg by mouth every 8 (eight) hours as needed for nausea or vomiting.       oxyCODONE (ROXICODONE) 5 MG immediate release tablet Take 1 tablet (5 mg total) by mouth every 6 (six) hours as needed for severe pain. 12 tablet 0   pantoprazole (PROTONIX) 40 MG tablet Take 40 mg by mouth daily.       prazosin (MINIPRESS) 2 MG capsule Take 4 mg by mouth at bedtime.       pregabalin (LYRICA) 100 MG capsule One capsule in the morning and midday, 2 in the evening (Patient taking differently: Take 100 mg by mouth See admin instructions. Take 100 mg   in the morning and midday, 200 mg in the evening) 360 capsule 1   QUEtiapine (SEROQUEL) 400 MG tablet Take 0.5 tablets (200 mg total) by mouth at bedtime. (Patient taking differently: Take 800 mg by mouth at bedtime.)       QUEtiapine (SEROQUEL) 50 MG tablet Take 100 mg by mouth every morning.       simvastatin (ZOCOR) 10 MG tablet Take 10 mg by mouth daily.   6   traMADol ([name]) 50 MG tablet Take 50 mg by mouth 2 (two) times daily.       vortioxetine HBr ([name]) 20 MG TABS tablet Take 20 mg by mouth at bedtime.       ziprasidone ([name]) 20 MG capsule Take 20 mg by mouth at bedtime.        No current facility-administered medications for this visit.      Review of Systems: No chest pain. No shortness of breath. No urinary complaints.   PHYSICAL EXAM :       Wt Readings from Last 3 Encounters:  10/25/20 176 lb (79.8 kg)  09/30/20 169 lb 15.6 oz (77.1 kg)  08/09/20 169 lb 12.8 oz (77 kg)      BP 90/68   Pulse (!) 112   Ht 5'  5" (1.651 m)   Wt 176 lb (79.8 kg)   SpO2 99%   BMI 29.29 kg/m  Constitutional:  Pleasant female in no acute distress. Psychiatric: Normal mood and affect. Behavior is normal. EENT: Pupils normal.  Conjunctivae are  normal. No scleral icterus. Neck supple. Cardiovascular: Normal rate, regular rhythm. No edema Pulmonary/chest: Effort normal and breath sounds normal. No wheezing, rales or rhonchi. Abdominal: Soft, nondistended, nontender. Bowel sounds active throughout. There are no masses palpable. No hepatomegaly. Neurological: Alert and oriented to person place and time. Skin: Skin is warm and dry. No rashes noted.

## 2020-12-02 NOTE — Discharge Instructions (Signed)
YOU HAD AN ENDOSCOPIC PROCEDURE TODAY: Refer to the procedure report and other information in the discharge instructions given to you for any specific questions about what was found during the examination. If this information does not answer your questions, please call [place] office at [phone] to clarify.  ° °YOU SHOULD EXPECT: Some feelings of bloating in the abdomen. Passage of more gas than usual. Walking can help get rid of the air that was put into your GI tract during the procedure and reduce the bloating. If you had a lower endoscopy (such as a colonoscopy or flexible sigmoidoscopy) you may notice spotting of blood in your stool or on the toilet paper. Some abdominal soreness may be present for a day or two, also. ° °DIET: Your first meal following the procedure should be a light meal and then it is ok to progress to your normal diet. A half-sandwich or bowl of soup is an example of a good first meal. Heavy or fried foods are harder to digest and may make you feel nauseous or bloated. Drink plenty of fluids but you should avoid alcoholic beverages for 24 hours. If you had a esophageal dilation, please see attached instructions for diet.   ° °ACTIVITY: Your care partner should take you home directly after the procedure. You should plan to take it easy, moving slowly for the rest of the day. You can resume normal activity the day after the procedure however YOU SHOULD NOT DRIVE, use power tools, machinery or perform tasks that involve climbing or major physical exertion for 24 hours (because of the sedation medicines used during the test).  ° °SYMPTOMS TO REPORT IMMEDIATELY: °A gastroenterologist can be reached at any hour. Please call [phone]  for any of the following symptoms:  °Following lower endoscopy (colonoscopy, flexible sigmoidoscopy) °Excessive amounts of blood in the stool  °Significant tenderness, worsening of abdominal pains  °Swelling of the abdomen that is new, acute  °Fever of 100° or  higher  °Following upper endoscopy (EGD, EUS, ERCP, esophageal dilation) °Vomiting of blood or coffee ground material  °New, significant abdominal pain  °New, significant chest pain or pain under the shoulder blades  °Painful or persistently difficult swallowing  °New shortness of breath  °Black, tarry-looking or red, bloody stools ° °FOLLOW UP:  °If any biopsies were taken you will be contacted by phone or by letter within the next 1-3 weeks. Call [phone]  if you have not heard about the biopsies in 3 weeks.  °Please also call with any specific questions about appointments or follow up tests. ° °

## 2020-12-02 NOTE — Op Note (Signed)
[facility] Patient Name: Kim Hicks Procedure Date: 12/02/2020 MRN: [id number] Attending MD: [name] , MD Date of Birth: Apr 28, 1966 CSN: [id number] Age: 55 Admit Type: Outpatient Procedure:                Colonoscopy Indications:              Iron deficiency anemia Providers:                Pricilla Riffle. Fuller Plan, MD, Benay Pillow, RN, Elspeth Cho Tech., Technician, [facility],                            CRNA Referring MD:             Thedore Mins. Wellsm FNP Medicines:                Monitored Anesthesia Care Complications:            No immediate complications. Estimated blood loss:                            None. Estimated Blood Loss:     Estimated blood loss: none. Procedure:                Pre-Anesthesia Assessment:                           - Prior to the procedure, a History and Physical                            was performed, and patient medications and                            allergies were reviewed. The patient's tolerance of                            previous anesthesia was also reviewed. The risks                            and benefits of the procedure and the sedation                            options and risks were discussed with the patient.                            All questions were answered, and informed consent                            was obtained. Prior Anticoagulants: The patient has                            taken no previous anticoagulant or antiplatelet                            agents. ASA Grade Assessment:  III - A patient with                            severe systemic disease. After reviewing the risks                            and benefits, the patient was deemed in                            satisfactory condition to undergo the procedure.                           After obtaining informed consent, the colonoscope                            was passed under direct vision. Throughout  the                            procedure, the patient's blood pressure, pulse, and                            oxygen saturations were monitored continuously. The                            [id number]-H190DL ([id number]) Olympus pediatric colonscope                            was introduced through the anus and advanced to the                            the cecum, identified by appendiceal orifice and                            ileocecal valve. The ileocecal valve, appendiceal                            orifice, and rectum were photographed. The quality                            of the bowel preparation was fair despite a 2 day                            bowel prep and extensive lavage and suction. The                            colonoscopy was performed without difficulty. The                            patient tolerated the procedure well. Scope In: 9:44:48 AM Scope Out: 10:16:38 AM Scope Withdrawal Time: 0 hours 26 minutes 6 seconds  Total Procedure Duration: 0 hours 31 minutes 50 seconds  Findings:      The perianal and digital rectal examinations were normal.      A single medium-sized localized angiodysplastic lesion without  bleeding       was found in the proximal ascending colon. Fulguration to ablate the       lesion by argon plasma was successful. Estimated blood loss was minimal.      A 7 mm polyp was found in the proximal rectum. The polyp was sessile.       The polyp was removed with a cold snare. Resection and retrieval were       complete.      Internal hemorrhoids were found during retroflexion. The hemorrhoids       were moderate and Grade I (internal hemorrhoids that do not prolapse).      The exam was otherwise without abnormality on direct and retroflexion       views. Impression:               - Preparation of the colon was fair.                           - A single non-bleeding colonic angiodysplastic                            lesion. Treated with argon plasma coagulation  (APC).                           - One 7 mm polyp in the proximal rectum, removed                            with a cold snare. Resected and retrieved.                           - Internal hemorrhoids.                           - The examination was otherwise normal on direct                            and retroflexion views. Moderate Sedation:      Not Applicable - Patient had care per Anesthesia. Recommendation:           - Given fair prep repeat colonoscopy in 2 years                            after studies are complete for surveillance based                            on pathology results with a more extensive bowel                            prep. Current prep was 2 days.                           - Patient has a contact number available for                            emergencies. The signs and symptoms of potential  delayed complications were discussed with the                            patient. Return to normal activities tomorrow.                            Written discharge instructions were provided to the                            patient.                           - Resume previous diet.                           - Continue present medications.                           - Await pathology results.                           - No aspirin, ibuprofen, naproxen, or other                            non-steroidal anti-inflammatory drugs for 2 weeks. Procedure Code(s):        --- Professional ---                           [phone], Colonoscopy, flexible; with ablation of                            tumor(s), polyp(s), or other lesion(s) (includes                            pre- and post-dilation and guide wire passage, when                            performed)                           45385, 59, Colonoscopy, flexible; with removal of                            tumor(s), polyp(s), or other lesion(s) by snare                            technique Diagnosis  Code(s):        --- Professional ---                           K64.0, First degree hemorrhoids                           K55.20, Angiodysplasia of colon without hemorrhage                           K62.1, Rectal polyp  D50.9, Iron deficiency anemia, unspecified CPT copyright 2019 [employer]. All rights reserved. The codes documented in this report are preliminary and upon coder review may  be revised to meet current compliance requirements. [name], MD 12/02/2020 10:37:59 AM This report has been signed electronically. Number of Addenda: 0

## 2020-12-02 NOTE — Op Note (Signed)
[facility] Patient Name: Kim Hicks Procedure Date: 12/02/2020 MRN: [id number] Attending MD: [name] , MD Date of Birth: 07/25/1965 CSN: [id number] Age: 55 Admit Type: Outpatient Procedure:                Upper GI endoscopy Indications:              Iron deficiency anemia, Dysphagia, choking with                            liquids and solids Providers:                Pricilla Riffle. Fuller Plan, MD, Benay Pillow, RN, Elspeth Cho Tech., Technician, [facility],                            CRNA Referring MD:             Thedore Mins. Wells, FNP Medicines:                 Complications:            No immediate complications. Estimated Blood Loss:     Estimated blood loss was minimal. Procedure:                Pre-Anesthesia Assessment:                           - Prior to the procedure, a History and Physical                            was performed, and patient medications and                            allergies were reviewed. The patient's tolerance of                            previous anesthesia was also reviewed. The risks                            and benefits of the procedure and the sedation                            options and risks were discussed with the patient.                            All questions were answered, and informed consent                            was obtained. Prior Anticoagulants: The patient has                            taken no previous anticoagulant or antiplatelet                            agents.  ASA Grade Assessment: III - A patient with                            severe systemic disease. After reviewing the risks                            and benefits, the patient was deemed in                            satisfactory condition to undergo the procedure.                           After obtaining informed consent, the endoscope was                            passed under direct vision. Throughout  the                            procedure, the patient's blood pressure, pulse, and                            oxygen saturations were monitored continuously. The                            GIF-H190 ([id number]) was introduced through the                            mouth, and advanced to the second part of duodenum.                            The upper GI endoscopy was accomplished without                            difficulty. The patient tolerated the procedure                            well. Scope In: Scope Out: Findings:      Grade I varices were found in the distal esophagus. They were 4 mm in       largest diameter.      The exam of the esophagus was otherwise normal.      Moderate portal hypertensive gastropathy was found on the anterior wall       of the gastric body actively oozing. Fulguration to stop the bleeding by       argon plasma was successful.      The exam of the stomach was otherwise normal.      There was evidence of a previous plastic biliary stent in the second       portion of the duodenum with 3-4 cm of stent in the duodenum.      The exam of the duodenum was otherwise normal. Impression:               - Grade I esophageal varices.                           -  Portal hypertensive gastropathy actively oozing.                            Treated with argon plasma coagulation (APC).                           - Previous placed plastic biliary was found in the                            duodenum.                           - No specimens collected. Moderate Sedation:      Not Applicable - Patient had care per Anesthesia. Recommendation:           - Patient has a contact number available for                            emergencies. The signs and symptoms of potential                            delayed complications were discussed with the                            patient. Return to normal activities tomorrow.                            Written discharge instructions  were provided to the                            patient.                           - Resume previous diet.                           - Continue present medications.                           - No aspirin, ibuprofen, naproxen, or other                            non-steroidal anti-inflammatory drugs.                           - No findings to explain dysphagia, choking. If                            these symptoms persist proceed with MBSS.                           - Iron deficiency anemia from colonic AVM and                            portal gastropathy. Long term iron replacement  likely will be needed. Procedure Code(s):        --- Professional ---                           [phone], Esophagogastroduodenoscopy, flexible,                            transoral; with control of bleeding, any method Diagnosis Code(s):        --- Professional ---                           I85.00, Esophageal varices without bleeding                           K76.6, Portal hypertension                           K31.89, Other diseases of stomach and duodenum                           Z98.0, Intestinal bypass and anastomosis status                           D50.9, Iron deficiency anemia, unspecified                           R13.10, Dysphagia, unspecified CPT copyright 2019 [employer]. All rights reserved. The codes documented in this report are preliminary and upon coder review may  be revised to meet current compliance requirements. [name], MD 12/02/2020 10:56:30 AM This report has been signed electronically. Number of Addenda: 0

## 2020-12-03 DIAGNOSIS — D649 Anemia, unspecified: Secondary | ICD-10-CM | POA: Insufficient documentation

## 2020-12-03 LAB — SURGICAL PATHOLOGY

## 2020-12-04 ENCOUNTER — Encounter: Payer: Self-pay | Admitting: Gastroenterology

## 2020-12-11 ENCOUNTER — Other Ambulatory Visit: Payer: Self-pay

## 2020-12-11 NOTE — Progress Notes (Signed)
The proposed treatment discussed in conference is for discussion purpose only and is not a binding recommendation.  The patients have not been physically examined, or presented with their treatment options.  Therefore, final treatment plans cannot be decided.  

## 2020-12-13 ENCOUNTER — Other Ambulatory Visit: Payer: Self-pay

## 2020-12-13 ENCOUNTER — Ambulatory Visit
Admission: RE | Admit: 2020-12-13 | Discharge: 2020-12-13 | Disposition: A | Discharge status: Home / Self Care | Payer: Medicare Other | Source: Ambulatory Visit | Attending: Gastroenterology | Admitting: Gastroenterology

## 2020-12-13 ENCOUNTER — Encounter: Payer: Self-pay | Admitting: *Deleted

## 2020-12-13 DIAGNOSIS — K838 Other specified diseases of biliary tract: Secondary | ICD-10-CM

## 2020-12-13 HISTORY — PX: IR RADIOLOGIST EVAL & MGMT: IMG5224

## 2020-12-13 NOTE — Consult Note (Addendum)
Chief Complaint: Patient was seen in consultation today for chronic biloma.  Referring Physician(s): Mansouraty,Gabriel Jr.  Patient Status: [facility] - Out-pt  History of Present Illness: Kim Hicks is a 55 y.o. female with history of cirrhosis and chronic cholecystitis with the following events over the past 1.5 years:  08/04/19 - lap chole, back wall of gallbladder left in situ 08/06/19 - HIDA with bile leak 08/07/19 - ERCP with plastic stent placement 11/07/19 - ERCP with plastic stent replacement, persistent bile leak 05/13/20 - ERCP with covered stent placement, persistent bile leak 05/23/20 - Biloma drain placement 05/27/20 - ERCP with stent exchange for plastic stent, no definite bile leak 06/20/20 - Biloma drain injection, persistent fistulous connection to common bile duct 06/26/20 - ERCP with placement of 4 plastic CBD stents 07/10/20 - Biloma drain exchange, persistent fistulous connection to common bile duct 08/28/20 - Biloma drain exchange, persistent fistulous connection to common bile duct 09/30/20 - ERCP with plastic stents exchanged for covered stent 10/16/20 - Biliary drain check, persistent fistulous connection to common bile duct 11/26/20 - Biliary drain check, persistent fistulous connection to common bile duct  She has been deemed not a surgical candidate for biliary reconstruction.  IR was consulted for other potential options to resolve chronic bile leak between biloma and common bile duct. Initially, a diverting internal/external biliary drain was proposed.   She has been draining >500 ml per day lately and has been dehydrated.  She is likely going to receive IV crystalloid supplementation soon.  She states frustration with how long this process has been going on and is anxious to have it fixed.        Past Medical History:  Diagnosis Date   Anxiety    Cataract    REMOVED BOTH   Cirrhosis of liver (HCC)    Common migraine with intractable migraine 08/07/2020    Depression    Diabetes mellitus without complication (HCC)    Dysrhythmia    Edema    Family history of adverse reaction to anesthesia    mother had trouble waking up after surgery   Foot fracture, left    Headache    MIGRAINES   Hyperlipidemia    Hypertension    Neuromuscular disorder (HCC)    NEUROPATHY-both hands and feet   On supplemental oxygen by nasal cannula    2L at bedtime only NO CPAP JUST 02 PER PT   Oxygen deficiency    Palpitations    Sleep apnea    Wheezing     Past Surgical History:  Procedure Laterality Date   ABDOMINAL HYSTERECTOMY     one ovary left   BILIARY STENT PLACEMENT N/A 08/07/2019   Procedure: BILIARY STENT PLACEMENT;  Surgeon: [name], MD;  Location: [facility];  Service: Endoscopy;  Laterality: N/A;   BILIARY STENT PLACEMENT N/A 11/07/2019   Procedure: BILIARY STENT PLACEMENT;  Surgeon: [name], MD;  Location: [facility];  Service: Endoscopy;  Laterality: N/A;   BILIARY STENT PLACEMENT N/A 05/13/2020   Procedure: BILIARY STENT PLACEMENT;  Surgeon: [name], MD;  Location: [facility];  Service: Endoscopy;  Laterality: N/A;   BILIARY STENT PLACEMENT N/A 05/27/2020   Procedure: BILIARY STENT PLACEMENT;  Surgeon: [name], MD;  [facility]: [facility];  Service: Endoscopy;  Laterality: N/A;   BILIARY STENT PLACEMENT N/A 06/26/2020   Procedure: BILIARY STENT PLACEMENT;  Surgeon: [name], MD;  Location: [facility];  Service: Gastroenterology;  Laterality: N/A;  BILIARY STENT PLACEMENT N/A 09/30/2020   Procedure: BILIARY STENT PLACEMENT;  Surgeon: [name], MD;  Location: [facility];  Service: Gastroenterology;  Laterality: N/A;   BIOPSY  06/26/2020   Procedure: BIOPSY;  Surgeon: [name], MD;  Location: [facility];  Service: Gastroenterology;;   CATARACT EXTRACTION W/PHACO Right 01/11/2018   Procedure: CATARACT EXTRACTION PHACO AND INTRAOCULAR LENS PLACEMENT ([place]);   Surgeon: [name], MD;  Location: [facility];  Service: Ophthalmology;  Laterality: Right;  [place] 00:57.5 AP% 15.0 CDE 8.61 Fluid Pack Lot # [id number]   CATARACT EXTRACTION W/PHACO Left 02/15/2018   Procedure: CATARACT EXTRACTION PHACO AND INTRAOCULAR LENS PLACEMENT ([place]);  Surgeon: [name], MD;  Location: [facility];  Service: Ophthalmology;  Laterality: Left;  [place] 00:51 AP% 13.2 CDE 6.82 Fluid p ack lot # [id number]   CHOLECYSTECTOMY N/A 08/04/2019   Procedure: LAPAROSCOPIC CHOLECYSTECTOMY;  Surgeon: [name], MD;  Location: [facility];  Service: General;  Laterality: N/A;   COLONOSCOPY     COLONOSCOPY WITH PROPOFOL N/A 12/02/2020   Procedure: COLONOSCOPY WITH PROPOFOL;  Surgeon: [name], MD;  [facility]: [facility];  Service: Endoscopy;  Laterality: N/A;   ENDOSCOPIC RETROGRADE CHOLANGIOPANCREATOGRAPHY (ERCP) WITH PROPOFOL N/A 11/07/2019   Procedure: ENDOSCOPIC RETROGRADE CHOLANGIOPANCREATOGRAPHY (ERCP) WITH PROPOFOL;  Surgeon: [name], MD;  [facility]: [facility];  Service: Endoscopy;  Laterality: N/A;   ENDOSCOPIC RETROGRADE CHOLANGIOPANCREATOGRAPHY (ERCP) WITH PROPOFOL N/A 05/13/2020   Procedure: ENDOSCOPIC RETROGRADE CHOLANGIOPANCREATOGRAPHY (ERCP) WITH PROPOFOL;  Surgeon: [name], MD;  [facility]: [facility];  Service: Endoscopy;  Laterality: N/A;   ENDOSCOPIC RETROGRADE CHOLANGIOPANCREATOGRAPHY (ERCP) WITH PROPOFOL N/A 06/26/2020   Procedure: ENDOSCOPIC RETROGRADE CHOLANGIOPANCREATOGRAPHY (ERCP) WITH PROPOFOL;  Surgeon: [name], MD;  [facility]: [facility];  Service: Gastroenterology;  Laterality: N/A;   ENDOSCOPIC RETROGRADE CHOLANGIOPANCREATOGRAPHY (ERCP) WITH PROPOFOL N/A 09/30/2020   Procedure: ENDOSCOPIC RETROGRADE CHOLANGIOPANCREATOGRAPHY (ERCP) WITH PROPOFOL;  Surgeon: [name], MD;  [facility]: [facility];  Service: Gastroenterology;  Laterality: N/A;   ERCP N/A 08/07/2019   Procedure: ENDOSCOPIC RETROGRADE  CHOLANGIOPANCREATOGRAPHY (ERCP);  Surgeon: [name], MD;  Location: [facility];  Service: Endoscopy;  Laterality: N/A;   ERCP N/A 05/27/2020   Procedure: ENDOSCOPIC RETROGRADE CHOLANGIOPANCREATOGRAPHY (ERCP);  Surgeon: [name], MD;  Location: [facility];  Service: Endoscopy;  Laterality: N/A;   ESOPHAGOGASTRODUODENOSCOPY N/A 06/26/2020   Procedure: ESOPHAGOGASTRODUODENOSCOPY (EGD);  Surgeon: [name]., MD;  Location: [facility];  Service: Gastroenterology;  Laterality: N/A;   ESOPHAGOGASTRODUODENOSCOPY (EGD) WITH PROPOFOL N/A 12/02/2020   Procedure: ESOPHAGOGASTRODUODENOSCOPY (EGD) WITH PROPOFOL;  Surgeon: [name], MD;  [facility];  Service: Endoscopy;  Laterality: N/A;   HOT HEMOSTASIS N/A 12/02/2020   Procedure: HOT HEMOSTASIS (ARGON PLASMA COAGULATION/BICAP);  Surgeon: [name], MD;  Location: [facility];  Service: Endoscopy;  Laterality: N/A;   IR CHOLANGIOGRAM EXISTING TUBE  10/16/2020   IR EXCHANGE BILIARY DRAIN  07/10/2020   IR EXCHANGE BILIARY DRAIN  08/28/2020   IR EXCHANGE BILIARY DRAIN  11/26/2020   IR RADIOLOGIST EVAL & MGMT  06/20/2020   POLYPECTOMY  12/02/2020   Procedure: POLYPECTOMY;  Surgeon: [name], MD;  [facility]: [facility];  Service: Endoscopy;;   RCR Bilateral    REMOVAL OF STONES  08/07/2019   Procedure: REMOVAL OF STONES;  Surgeon: [name], MD;  [facility]: [facility];  Service: Endoscopy;;  balloon sweep, no stones   REMOVAL OF STONES  05/27/2020   Procedure: REMOVAL OF STONES;  Surgeon: Fuller Plan,  Pricilla Riffle, MD;  Location: [facility];  Service: Endoscopy;;   REMOVAL OF STONES  06/26/2020   Procedure: REMOVAL OF STONES;  Surgeon: [name], MD;  Location: [facility];  Service: Gastroenterology;;   REMOVAL OF STONES  09/30/2020   Procedure: REMOVAL OF STONES;  Surgeon: [name]., MD;  Location: [facility];  Service: Gastroenterology;;   [name]  08/07/2019    Procedure: SPHINCTEROTOMY;  Surgeon: [name], MD;  [facility];  Service: Endoscopy;;   STENT REMOVAL  11/07/2019   Procedure: STENT REMOVAL;  Surgeon: [name], MD;  [facility]: [facility];  Service: Endoscopy;;   STENT REMOVAL  05/13/2020   Procedure: STENT REMOVAL;  Surgeon: [name], MD;  [facility];  Service: Endoscopy;;   STENT REMOVAL  05/27/2020   Procedure: STENT REMOVAL;  Surgeon: [name], MD;  [facility];  Service: Endoscopy;;   STENT REMOVAL  06/26/2020   Procedure: STENT REMOVAL;  Surgeon: [name]., MD;  Location: [facility];  Service: Gastroenterology;;   Lavell Islam REMOVAL  09/30/2020   Procedure: STENT REMOVAL;  Surgeon: [name]., MD;  [facility]: [facility];  Service: Gastroenterology;;    Allergies: Dilaudid [hydromorphone] and Talwin [pentazocine]  Medications: Prior to Admission medications   Medication Sig Start Date End Date Taking? Authorizing Provider  acetaminophen (TYLENOL) 500 MG tablet Take 1,000 mg by mouth every 6 (six) hours as needed for moderate pain.    [provider]  AIMOVIG 140 MG/ML SOAJ Inject 140 mg into the skin every 30 (thirty) days. 09/10/20   [provider]  ALPRAZolam Duanne Moron) 0.5 MG tablet Take 0.5 mg by mouth 3 (three) times daily. 08/30/20   [provider]  brexpiprazole (REXULTI) 2 MG TABS tablet Take 2 mg by mouth at bedtime.    [provider]  cyclobenzaprine (FLEXERIL) 5 MG tablet Take 5 mg by mouth 3 (three) times daily as needed. 11/15/20   [provider]  Dulaglutide 4.5 MG/0.5ML SOPN Inject 4.5 mg into the skin every Monday. 12/20/18   [provider]  Empagliflozin-metFORMIN HCl ER (SYNJARDY XR) 25-1000 MG TB24 Take 1 tablet by mouth daily before breakfast.  11/15/18   [provider]  ezetimibe (ZETIA) 10 MG tablet Take 10 mg by mouth daily. 04/25/20   [provider]  Insulin  Glargine (BASAGLAR KWIKPEN) 100 UNIT/ML Inject 44 Units into the skin at bedtime. 03/20/20   [provider]  iron polysaccharides (NIFEREX) 150 MG capsule Take 1 capsule (150 mg total) by mouth daily. 10/04/20   Eugenie Filler, MD  lactulose (CHRONULAC) 10 GM/15ML solution Take 15 mLs (10 g total) by mouth 3 (three) times daily. 11/19/20   [name], MD  linaclotide Rolan Lipa) 290 MCG CAPS capsule Take 1 capsule (290 mcg total) by mouth daily before breakfast. 08/30/20   [name], MD  lisinopril (ZESTRIL) 40 MG tablet Take 40 mg by mouth every morning. 09/27/20   [provider]  meloxicam (MOBIC) 15 MG tablet Take 1 tablet by mouth daily. 10/15/20   [provider]  metFORMIN (GLUCOPHAGE) 1000 MG tablet Take 1,000 mg by mouth at bedtime. 02/27/20   [provider]  Na Sulfate-K Sulfate-Mg Sulf 17.5-3.13-1.6 GM/177ML SOLN Take per colonoscopy instructions sheet 11/19/20   [name], MD  ondansetron (ZOFRAN ODT) 4 MG disintegrating tablet Take 1 tablet (4 mg total) by mouth every 8 (eight) hours as needed for nausea or vomiting.  11/18/20   Alroy Bailiff, Margaux, PA-C  ondansetron (ZOFRAN-ODT) 4 MG disintegrating tablet Take 4 mg by mouth every 8 (eight) hours as needed for nausea or vomiting.    [provider]  pantoprazole (PROTONIX) 40 MG tablet Take 40 mg by mouth daily. 08/19/20   [provider]  polyethylene glycol (GOLYTELY) 236 g solution Drink one glass of the solution every 15 minutes until gone 11/18/20   Willia Craze, NP  prazosin (MINIPRESS) 2 MG capsule Take 4 mg by mouth at bedtime.    [provider]  pregabalin (LYRICA) 100 MG capsule One capsule in the morning and midday, 2 in the evening Patient taking differently: Take 100 mg by mouth See admin instructions. Take 100 mg   in the morning and midday, 200 mg in the evening 08/07/20   Kathrynn Ducking, MD  QUEtiapine (SEROQUEL) 400 MG tablet Take 0.5 tablets  (200 mg total) by mouth at bedtime. Patient taking differently: Take 800 mg by mouth at bedtime. 07/13/20   [place] Eric J, DO  QUEtiapine (SEROQUEL) 50 MG tablet Take 100 mg by mouth every morning. 08/30/20   [provider]  simvastatin (ZOCOR) 10 MG tablet Take 10 mg by mouth daily. 12/13/17   [provider]  sulfamethoxazole-trimethoprim (BACTRIM DS) 800-160 MG tablet Take 1 tablet by mouth 2 (two) times daily. 11/04/20   [provider]  traMADol ([name]) 50 MG tablet Take 50 mg by mouth 2 (two) times daily as needed for moderate pain. 08/24/20   [provider]  vortioxetine HBr ([name]) 20 MG TABS tablet Take 20 mg by mouth at bedtime.    [provider]  ziprasidone ([name]) 20 MG capsule Take 20 mg by mouth at bedtime. 08/30/20   [provider]     Family History  Problem Relation Age of Onset   Diabetes Mother    Heart failure Mother    Depression Mother    Colon cancer Neg Hx    Stomach cancer Neg Hx    Esophageal cancer Neg Hx    Pancreatic cancer Neg Hx    Colon polyps Neg Hx    Rectal cancer Neg Hx     Social History   Socioeconomic History   Marital status: Married    Spouse name: Not on file   Number of children: Not on file   Years of education: Not on file   Highest education level: Not on file  Occupational History   Not on file  Tobacco Use   Smoking status: Never   Smokeless tobacco: Never  Vaping Use   Vaping Use: Never used  Substance and Sexual Activity   Alcohol use: Never   Drug use: Never   Sexual activity: Not Currently  Other Topics Concern   Not on file  Social History Narrative   Lives with niece and husband   Right handed   Drinks 4-5 cups daily   Social Determinants of Health   Financial Resource Strain: Not on file  Food Insecurity: Not on file  Transportation Needs: Not on file  Physical Activity: Not on file  Stress: Not on file  Social Connections: Not on file    Review  of Systems: A 12 point ROS discussed and pertinent positives are indicated in the HPI above.  All other systems are negative.  Vital Signs: There were no vitals taken for this visit.  No physical examination in lieu of telephone visit.  Imaging: Biloma drain exchange (11/26/20)  Labs:  CBC: Recent Labs    10/02/20 0300 10/03/20 0549 11/01/20 1326 11/18/20 0744  WBC 4.7 3.8* 6.7 5.8  HGB 9.2* 9.0* 11.5* 11.5*  HCT 31.9* 31.6* 38.3 37.9  PLT 146* 137* 136* 123*    COAGS: Recent Labs    05/20/20 2100 06/27/20 1700 11/18/20 0828  INR 1.6* 1.0 1.1  APTT 58*  --  28    BMP: Recent Labs    02/16/20 0520 05/20/20 1152 07/08/20 0528 07/08/20 0630 10/02/20 0300 10/03/20 0549 11/01/20 1326 11/18/20 0744  NA 138   < > QUESTIONABLE RESULTS, RECOMMEND RECOLLECT TO VERIFY   < > 136 141 140 136  K 3.9   < > QUESTIONABLE RESULTS, RECOMMEND RECOLLECT TO VERIFY   < > 4.4 3.5 5.1 5.4*  CL 107   < > QUESTIONABLE RESULTS, RECOMMEND RECOLLECT TO VERIFY   < > 105 109 108 107  CO2 17*   < > QUESTIONABLE RESULTS, RECOMMEND RECOLLECT TO VERIFY   < > 24 29 23  19*  GLUCOSE 339*   < > QUESTIONABLE RESULTS, RECOMMEND RECOLLECT TO VERIFY   < > 114* 163* 204* 149*  BUN 39*   < > QUESTIONABLE RESULTS, RECOMMEND RECOLLECT TO VERIFY   < > 19 11 30* 31*  CALCIUM 8.1*   < > QUESTIONABLE RESULTS, RECOMMEND RECOLLECT TO VERIFY   < > 8.3* 8.2* 9.4 9.5  CREATININE 1.08*   < > QUESTIONABLE RESULTS, RECOMMEND RECOLLECT TO VERIFY   < > 0.54 0.61 0.72 0.89  GFRNONAA 58*   < > QUESTIONABLE RESULTS, RECOMMEND RECOLLECT TO VERIFY   < > >60 >60 >60 >60  GFRAA >60  --  QUESTIONABLE RESULTS, RECOMMEND RECOLLECT TO VERIFY  --   --   --   --   --    < > = values in this interval not displayed.    LIVER FUNCTION TESTS: Recent Labs    10/02/20 0300 10/03/20 0549 11/01/20 1326 11/18/20 0744  BILITOT 0.7 0.4 0.2* 0.5  AST 53* 39 48* 45*  ALT 32 24 38 31  ALKPHOS 122 117 203* 163*  PROT 6.2* 6.0* 8.0 8.1   ALBUMIN 3.0* 3.0* 4.3 4.5    TUMOR MARKERS: No results for input(s): AFPTM, CEA, CA199, CHROMGRNA in the last 8760 hours.  Assessment and Plan: 55 year old female with history of chronic biloma with fistulous connection to common bile duct via cystic duct remnant setting status post laparoscopic fenestrated cholecystectomy in March 2021.  She would be an excellent candidate for cystic duct glue embolization to obliterate the chronic biloma.  Plan to leave drain in place for several weeks after glue embolization, followed by capping trial then drain removal if embolization successful.  This plan was discussed along with its risks and benefits, and she is amenable to proceed.    Plan for cystic duct n-BCA glue embolization and biloma drain exchange with moderate sedation at [facility] with same day discharge home.    Thank you for this interesting consult.  I greatly enjoyed meeting WENDY HOBACK and look forward to participating in their care.  A copy of this report was sent to the requesting provider on this date.  Electronically Signed: Suzette Battiest, MD 12/13/2020, 10:39 AM   I spent a total of  60 Minutes  in face to face in clinical consultation, greater than 50% of which was counseling/coordinating care for chronic biloma.

## 2020-12-16 ENCOUNTER — Other Ambulatory Visit (HOSPITAL_COMMUNITY): Payer: Self-pay | Admitting: Interventional Radiology

## 2020-12-16 ENCOUNTER — Other Ambulatory Visit: Payer: Self-pay

## 2020-12-16 ENCOUNTER — Encounter (HOSPITAL_COMMUNITY): Payer: Self-pay | Admitting: Interventional Radiology

## 2020-12-16 ENCOUNTER — Ambulatory Visit (HOSPITAL_COMMUNITY)
Admission: RE | Admit: 2020-12-16 | Discharge: 2020-12-16 | Disposition: A | Discharge status: Home / Self Care | Payer: Medicare Other | Source: Ambulatory Visit | Attending: Interventional Radiology | Admitting: Interventional Radiology

## 2020-12-16 DIAGNOSIS — K9189 Other postprocedural complications and disorders of digestive system: Secondary | ICD-10-CM | POA: Insufficient documentation

## 2020-12-16 DIAGNOSIS — K838 Other specified diseases of biliary tract: Secondary | ICD-10-CM | POA: Diagnosis not present

## 2020-12-16 DIAGNOSIS — Z434 Encounter for attention to other artificial openings of digestive tract: Secondary | ICD-10-CM | POA: Insufficient documentation

## 2020-12-16 HISTORY — PX: IR CATHETER TUBE CHANGE: IMG717

## 2020-12-16 MED ORDER — IOHEXOL 300 MG/ML  SOLN
10.0000 mL | Freq: Once | INTRAMUSCULAR | Status: AC | PRN
  Administered 2020-12-16: 10 mL

## 2020-12-16 MED ORDER — LIDOCAINE HCL 1 % IJ SOLN
INTRAMUSCULAR | Status: AC | PRN
  Administered 2020-12-16: 5 mL

## 2020-12-16 MED ORDER — LIDOCAINE HCL 1 % IJ SOLN
INTRAMUSCULAR | Status: AC
  Administered 2020-12-16: 5 mL
  Filled 2020-12-16: qty 20

## 2020-12-16 NOTE — Procedures (Signed)
Vascular and Interventional Radiology Procedure Note  Patient: CHIANNA SPIRITO DOB: Jul 31, 1965 Medical Record Number: [id number] Note Date/Time: 12/16/20 12:57 PM   Performing Physician: Michaelle Birks, MD Assistant(s): None  Diagnosis: Hx cholecystitis with indwelling chole tube. Drain fell out.  Procedure:  CHOLECYSTOSTOMY TUBE REPLACEMENT ANTEROGRADE CHOLANGIOGRAM  Anesthesia: Local Anesthetic Complications: None Estimated Blood Loss:  0 mL Specimens:  None  Findings:  Successful replacement of an 98F cholecystostomy tube.  Plan: Follow up for routine tube evaluation in 3 month(s).   See detailed procedure note with images in PACS. The patient tolerated the procedure well without incident or complication and was returned to Recovery in stable condition.    Michaelle Birks, MD V[facility] Interventional Radiology [facility] Radiology   Clinic: [phone]

## 2020-12-24 ENCOUNTER — Other Ambulatory Visit: Payer: Self-pay | Admitting: Radiology

## 2020-12-24 ENCOUNTER — Other Ambulatory Visit (HOSPITAL_COMMUNITY): Payer: Self-pay | Admitting: Interventional Radiology

## 2020-12-24 DIAGNOSIS — K839 Disease of biliary tract, unspecified: Secondary | ICD-10-CM

## 2020-12-25 ENCOUNTER — Ambulatory Visit (HOSPITAL_COMMUNITY)
Admission: RE | Admit: 2020-12-25 | Discharge: 2020-12-25 | Disposition: A | Discharge status: Home / Self Care | Payer: Medicare Other | Source: Ambulatory Visit | Attending: Interventional Radiology | Admitting: Interventional Radiology

## 2020-12-25 ENCOUNTER — Other Ambulatory Visit: Payer: Self-pay

## 2020-12-25 DIAGNOSIS — Z7984 Long term (current) use of oral hypoglycemic drugs: Secondary | ICD-10-CM | POA: Diagnosis not present

## 2020-12-25 DIAGNOSIS — Z90710 Acquired absence of both cervix and uterus: Secondary | ICD-10-CM | POA: Diagnosis not present

## 2020-12-25 DIAGNOSIS — K7460 Unspecified cirrhosis of liver: Secondary | ICD-10-CM | POA: Insufficient documentation

## 2020-12-25 DIAGNOSIS — Z885 Allergy status to narcotic agent status: Secondary | ICD-10-CM | POA: Insufficient documentation

## 2020-12-25 DIAGNOSIS — I10 Essential (primary) hypertension: Secondary | ICD-10-CM | POA: Diagnosis not present

## 2020-12-25 DIAGNOSIS — Z4803 Encounter for change or removal of drains: Secondary | ICD-10-CM | POA: Insufficient documentation

## 2020-12-25 DIAGNOSIS — Z79899 Other long term (current) drug therapy: Secondary | ICD-10-CM | POA: Diagnosis not present

## 2020-12-25 DIAGNOSIS — Z794 Long term (current) use of insulin: Secondary | ICD-10-CM | POA: Diagnosis not present

## 2020-12-25 DIAGNOSIS — E785 Hyperlipidemia, unspecified: Secondary | ICD-10-CM | POA: Diagnosis not present

## 2020-12-25 DIAGNOSIS — Z9981 Dependence on supplemental oxygen: Secondary | ICD-10-CM | POA: Insufficient documentation

## 2020-12-25 DIAGNOSIS — Z9049 Acquired absence of other specified parts of digestive tract: Secondary | ICD-10-CM | POA: Diagnosis not present

## 2020-12-25 DIAGNOSIS — Z90721 Acquired absence of ovaries, unilateral: Secondary | ICD-10-CM | POA: Diagnosis not present

## 2020-12-25 DIAGNOSIS — E1140 Type 2 diabetes mellitus with diabetic neuropathy, unspecified: Secondary | ICD-10-CM | POA: Diagnosis not present

## 2020-12-25 DIAGNOSIS — K839 Disease of biliary tract, unspecified: Secondary | ICD-10-CM

## 2020-12-25 HISTORY — PX: IR EMBO VENOUS NOT HEMORR HEMANG  INC GUIDE ROADMAPPING: IMG5447

## 2020-12-25 LAB — COMPREHENSIVE METABOLIC PANEL
ALT: 28 U/L (ref 0–44)
AST: 38 U/L (ref 15–41)
Albumin: 3.5 g/dL (ref 3.5–5.0)
Alkaline Phosphatase: 217 U/L — ABNORMAL HIGH (ref 38–126)
Anion gap: 10 (ref 5–15)
BUN: 29 mg/dL — ABNORMAL HIGH (ref 6–20)
CO2: 24 mmol/L (ref 22–32)
Calcium: 8.9 mg/dL (ref 8.9–10.3)
Chloride: 105 mmol/L (ref 98–111)
Creatinine, Ser: 0.7 mg/dL (ref 0.44–1.00)
GFR, Estimated: >60 mL/min (ref >60)
Glucose, Bld: 394 mg/dL — ABNORMAL HIGH (ref 70–99)
Potassium: 4.8 mmol/L (ref 3.5–5.1)
Sodium: 139 mmol/L (ref 135–145)
Total Bilirubin: 0.5 mg/dL (ref 0.3–1.2)
Total Protein: 7.1 g/dL (ref 6.5–8.1)

## 2020-12-25 LAB — CBC
HCT: 39 % (ref 36.0–46.0)
Hemoglobin: 11.6 g/dL — ABNORMAL LOW (ref 12.0–15.0)
MCH: 28.2 pg (ref 26.0–34.0)
MCHC: 29.7 g/dL — ABNORMAL LOW (ref 30.0–36.0)
MCV: 94.9 fL (ref 80.0–100.0)
Platelets: 155 10*3/uL (ref 150–400)
RBC: 4.11 MIL/uL (ref 3.87–5.11)
RDW: 15.7 % — ABNORMAL HIGH (ref 11.5–15.5)
WBC: 5.2 10*3/uL (ref 4.0–10.5)
nRBC: 0 % (ref 0.0–0.2)

## 2020-12-25 LAB — GLUCOSE, CAPILLARY
Glucose-Capillary: 396 mg/dL — ABNORMAL HIGH (ref 70–99)
Glucose-Capillary: 266 mg/dL — ABNORMAL HIGH (ref 70–99)
Glucose-Capillary: 160 mg/dL — ABNORMAL HIGH (ref 70–99)

## 2020-12-25 LAB — PROTIME-INR
INR: 1 (ref 0.8–1.2)
Prothrombin Time: 13.5 seconds (ref 11.4–15.2)

## 2020-12-25 LAB — APTT: aPTT: 27 seconds (ref 24–36)

## 2020-12-25 MED ORDER — SODIUM CHLORIDE 0.9 % IV SOLN
2.0000 g | Freq: Once | INTRAVENOUS | Status: AC
  Administered 2020-12-25: 2 g via INTRAVENOUS
  Filled 2020-12-25: qty 20

## 2020-12-25 MED ORDER — CEFAZOLIN SODIUM-DEXTROSE 2-4 GM/100ML-% IV SOLN: 2.0000 g | INTRAVENOUS | Status: DC

## 2020-12-25 MED ORDER — IOHEXOL 300 MG/ML  SOLN
100.0000 mL | Freq: Once | INTRAMUSCULAR | Status: AC | PRN
  Administered 2020-12-25: 50 mL

## 2020-12-25 MED ORDER — INSULIN ASPART 100 UNIT/ML IJ SOLN
15.0000 [IU] | Freq: Once | INTRAMUSCULAR | Status: AC
  Administered 2020-12-25: 15 [IU] via SUBCUTANEOUS
  Filled 2020-12-25: qty 1

## 2020-12-25 MED ORDER — SODIUM CHLORIDE 0.9% FLUSH: 5.0000 mL | Freq: Three times a day (TID) | INTRAVENOUS | Status: DC

## 2020-12-25 MED ORDER — MIDAZOLAM HCL 2 MG/2ML IJ SOLN
INTRAMUSCULAR | Status: AC
  Filled 2020-12-25: qty 4

## 2020-12-25 MED ORDER — INSULIN ASPART 100 UNIT/ML IJ SOLN: 15.0000 [IU] | Freq: Once | INTRAMUSCULAR | Status: DC

## 2020-12-25 MED ORDER — FENTANYL CITRATE (PF) 100 MCG/2ML IJ SOLN
INTRAMUSCULAR | Status: AC
  Filled 2020-12-25: qty 4

## 2020-12-25 MED ORDER — LIDOCAINE HCL 1 % IJ SOLN
INTRAMUSCULAR | Status: AC
  Filled 2020-12-25: qty 20

## 2020-12-25 MED ORDER — MIDAZOLAM HCL 2 MG/2ML IJ SOLN
INTRAMUSCULAR | Status: AC | PRN
  Administered 2020-12-25: 0.5 mg via INTRAVENOUS
  Administered 2020-12-25: 1 mg via INTRAVENOUS
  Administered 2020-12-25 (×3): 0.5 mg via INTRAVENOUS

## 2020-12-25 MED ORDER — LIDOCAINE HCL (PF) 1 % IJ SOLN
INTRAMUSCULAR | Status: AC | PRN
Start: 2020-12-25 — End: 2020-12-25
  Administered 2020-12-25: 5 mL

## 2020-12-25 MED ORDER — FENTANYL CITRATE (PF) 100 MCG/2ML IJ SOLN
INTRAMUSCULAR | Status: AC | PRN
  Administered 2020-12-25: 50 ug via INTRAVENOUS
  Administered 2020-12-25: 25 ug via INTRAVENOUS
  Administered 2020-12-25: 50 ug via INTRAVENOUS
  Administered 2020-12-25 (×2): 25 ug via INTRAVENOUS

## 2020-12-25 MED ORDER — INSULIN ASPART 100 UNIT/ML IJ SOLN
INTRAMUSCULAR | Status: AC
  Filled 2020-12-25: qty 1

## 2020-12-25 NOTE — Procedures (Signed)
Interventional Radiology Procedure Note  Procedure:  1) Cystic duct n-BCA glue embolization 2) Biloma drain exchange  Findings: Please refer to procedural dictation for full description. Successful glue embolization of cystic duct with approximately 1 mL n-BCA.  Successful replacement of 8.5 Fr pigtail biloma drain, to bag drainage.  Complications: None immediate  Estimated Blood Loss: < 5 mL  Recommendations: Keep to bag drainage, stop flushing. Follow up in 2 weeks with repeat cholecystogram (in IR clinic, [facility], or [facility] with either myself, Dr. Denna Haggard, or Dr. Dwaine Gale).   Ruthann Cancer, MD

## 2020-12-25 NOTE — Progress Notes (Signed)
Discharge instructions reviewed with pt and her husband. Both voice understanding,

## 2020-12-25 NOTE — H&P (Signed)
Chief Complaint: Patient was seen in consultation today for Chronic bile leak/biloma  at the request of Suttle,Dylan J  Referring Physician(s): Suzette Battiest  Supervising Physician: Ruthann Cancer  Patient Status: [facility] - Out-pt  History of Present Illness: Kim Hicks is a 55 y.o. female  with history of cirrhosis and chronic cholecystitis with the following events over the past 1.5 years:   08/04/19 - lap chole, back wall of gallbladder left in situ 08/06/19 - HIDA with bile leak 08/07/19 - ERCP with plastic stent placement 11/07/19 - ERCP with plastic stent replacement, persistent bile leak 05/13/20 - ERCP with covered stent placement, persistent bile leak 05/23/20 - Biloma drain placement 05/27/20 - ERCP with stent exchange for plastic stent, no definite bile leak 06/20/20 - Biloma drain injection, persistent fistulous connection to common bile duct 06/26/20 - ERCP with placement of 4 plastic CBD stents 07/10/20 - Biloma drain exchange, persistent fistulous connection to common bile duct 08/28/20 - Biloma drain exchange, persistent fistulous connection to common bile duct 09/30/20 - ERCP with plastic stents exchanged for covered stent 10/16/20 - Biliary drain check, persistent fistulous connection to common bile duct 11/26/20 - Biliary drain check, persistent fistulous connection to common bile duct   She has been deemed not a surgical candidate for biliary reconstruction.  IR was consulted for other potential options to resolve chronic bile leak between biloma and common bile duct. Initially, a diverting internal/external biliary drain was proposed.   She has been draining >500 ml per day lately and has been dehydrated.  She is likely going to receive IV crystalloid supplementation soon.  She states frustration with how long this process has been going on and is anxious to have it fixed.    She is scheduled for glue embolization as discussed at her consult. Pt reports feeling well. No  fevers, chills, abd pain, chest pain. Family at bedside.   Past Medical History:  Diagnosis Date   Anxiety    Cataract    REMOVED BOTH   Cirrhosis of liver (HCC)    Common migraine with intractable migraine 08/07/2020   Depression    Diabetes mellitus without complication (HCC)    Dysrhythmia    Edema    Family history of adverse reaction to anesthesia    mother had trouble waking up after surgery   Foot fracture, left    Headache    MIGRAINES   Hyperlipidemia    Hypertension    Neuromuscular disorder (HCC)    NEUROPATHY-both hands and feet   On supplemental oxygen by nasal cannula    2L at bedtime only NO CPAP JUST 02 PER PT   Oxygen deficiency    Palpitations    Sleep apnea    Wheezing     Past Surgical History:  Procedure Laterality Date   ABDOMINAL HYSTERECTOMY     one ovary left   BILIARY STENT PLACEMENT N/A 08/07/2019   Procedure: BILIARY STENT PLACEMENT;  Surgeon: [name], MD;  Location: [facility];  Service: Endoscopy;  Laterality: N/A;   BILIARY STENT PLACEMENT N/A 11/07/2019   Procedure: BILIARY STENT PLACEMENT;  Surgeon: [name], MD;  [facility]: [facility];  Service: Endoscopy;  Laterality: N/A;   BILIARY STENT PLACEMENT N/A 05/13/2020   Procedure: BILIARY STENT PLACEMENT;  Surgeon: [name], MD;  [facility]: [facility];  Service: Endoscopy;  Laterality: N/A;   BILIARY STENT PLACEMENT N/A 05/27/2020   Procedure: BILIARY STENT PLACEMENT;  Surgeon: [name], MD;  Location:  [facility];  Service: Endoscopy;  Laterality: N/A;   BILIARY STENT PLACEMENT N/A 06/26/2020   Procedure: BILIARY STENT PLACEMENT;  Surgeon: [name], MD;  [facility]: [facility];  Service: Gastroenterology;  Laterality: N/A;   BILIARY STENT PLACEMENT N/A 09/30/2020   Procedure: BILIARY STENT PLACEMENT;  Surgeon: [name], MD;  [facility]: [facility];  Service: Gastroenterology;  Laterality: N/A;   BIOPSY  06/26/2020   Procedure:  BIOPSY;  Surgeon: [name], MD;  Location: [facility];  Service: Gastroenterology;;   CATARACT EXTRACTION W/PHACO Right 01/11/2018   Procedure: CATARACT EXTRACTION PHACO AND INTRAOCULAR LENS PLACEMENT ([place]);  Surgeon: [name], MD;  Location: [facility];  Service: Ophthalmology;  Laterality: Right;  [place] 00:57.5 AP% 15.0 CDE 8.61 Fluid Pack Lot # [id number]   CATARACT EXTRACTION W/PHACO Left 02/15/2018   Procedure: CATARACT EXTRACTION PHACO AND INTRAOCULAR LENS PLACEMENT ([place]);  Surgeon: [name], MD;  Location: [facility];  Service: Ophthalmology;  Laterality: Left;  [place] 00:51 AP% 13.2 CDE 6.82 Fluid p ack lot # [id number]   CHOLECYSTECTOMY N/A 08/04/2019   Procedure: LAPAROSCOPIC CHOLECYSTECTOMY;  Surgeon: [name], MD;  [facility]: [facility];  Service: General;  Laterality: N/A;   COLONOSCOPY     COLONOSCOPY WITH PROPOFOL N/A 12/02/2020   Procedure: COLONOSCOPY WITH PROPOFOL;  Surgeon: [name], MD;  [facility]: [facility];  Service: Endoscopy;  Laterality: N/A;   ENDOSCOPIC RETROGRADE CHOLANGIOPANCREATOGRAPHY (ERCP) WITH PROPOFOL N/A 11/07/2019   Procedure: ENDOSCOPIC RETROGRADE CHOLANGIOPANCREATOGRAPHY (ERCP) WITH PROPOFOL;  Surgeon: [name], MD;  [facility];  Service: Endoscopy;  Laterality: N/A;   ENDOSCOPIC RETROGRADE CHOLANGIOPANCREATOGRAPHY (ERCP) WITH PROPOFOL N/A 05/13/2020   Procedure: ENDOSCOPIC RETROGRADE CHOLANGIOPANCREATOGRAPHY (ERCP) WITH PROPOFOL;  Surgeon: [name], MD;  [facility];  Service: Endoscopy;  Laterality: N/A;   ENDOSCOPIC RETROGRADE CHOLANGIOPANCREATOGRAPHY (ERCP) WITH PROPOFOL N/A 06/26/2020   Procedure: ENDOSCOPIC RETROGRADE CHOLANGIOPANCREATOGRAPHY (ERCP) WITH PROPOFOL;  Surgeon: [name], MD;  [facility];  Service: Gastroenterology;  Laterality: N/A;   ENDOSCOPIC RETROGRADE CHOLANGIOPANCREATOGRAPHY (ERCP) WITH PROPOFOL N/A 09/30/2020   Procedure: ENDOSCOPIC  RETROGRADE CHOLANGIOPANCREATOGRAPHY (ERCP) WITH PROPOFOL;  Surgeon: [name], MD;  [facility];  Service: Gastroenterology;  Laterality: N/A;   ERCP N/A 08/07/2019   Procedure: ENDOSCOPIC RETROGRADE CHOLANGIOPANCREATOGRAPHY (ERCP);  Surgeon: [name], MD;  Location: [facility];  Service: Endoscopy;  Laterality: N/A;   ERCP N/A 05/27/2020   Procedure: ENDOSCOPIC RETROGRADE CHOLANGIOPANCREATOGRAPHY (ERCP);  Surgeon: [name], MD;  Location: [facility];  Service: Endoscopy;  Laterality: N/A;   ESOPHAGOGASTRODUODENOSCOPY N/A 06/26/2020   Procedure: ESOPHAGOGASTRODUODENOSCOPY (EGD);  Surgeon: [name]., MD;  Location: [facility];  Service: Gastroenterology;  Laterality: N/A;   ESOPHAGOGASTRODUODENOSCOPY (EGD) WITH PROPOFOL N/A 12/02/2020   Procedure: ESOPHAGOGASTRODUODENOSCOPY (EGD) WITH PROPOFOL;  Surgeon: [name], MD;  [facility];  Service: Endoscopy;  Laterality: N/A;   HOT HEMOSTASIS N/A 12/02/2020   Procedure: HOT HEMOSTASIS (ARGON PLASMA COAGULATION/BICAP);  Surgeon: [name], MD;  Location: [facility];  Service: Endoscopy;  Laterality: N/A;   IR CATHETER TUBE CHANGE  12/16/2020   IR CHOLANGIOGRAM EXISTING TUBE  10/16/2020   IR EXCHANGE BILIARY DRAIN  07/10/2020   IR EXCHANGE BILIARY DRAIN  08/28/2020   IR EXCHANGE BILIARY DRAIN  11/26/2020   IR RADIOLOGIST EVAL & MGMT  06/20/2020   IR RADIOLOGIST EVAL & MGMT  12/13/2020   POLYPECTOMY  12/02/2020   Procedure: POLYPECTOMY;  Surgeon: [name], MD;  Location: [facility];  Service: Endoscopy;;   RCR Bilateral    REMOVAL OF STONES  08/07/2019   Procedure: REMOVAL OF STONES;  Surgeon: [name], MD;  [facility]: [facility];  Service: Endoscopy;;  balloon sweep, no stones   REMOVAL OF STONES  05/27/2020   Procedure: REMOVAL OF STONES;  Surgeon: [name], MD;  [facility]: [facility];  Service: Endoscopy;;   REMOVAL OF STONES  06/26/2020   Procedure:  REMOVAL OF STONES;  Surgeon: [name]., MD;  Location: [facility];  Service: Gastroenterology;;   REMOVAL OF STONES  09/30/2020   Procedure: REMOVAL OF STONES;  Surgeon: [name]., MD;  Location: [facility];  Service: Gastroenterology;;   [name]  08/07/2019   Procedure: [name];  Surgeon: [name], MD;  [facility];  Service: Endoscopy;;   STENT REMOVAL  11/07/2019   Procedure: STENT REMOVAL;  Surgeon: [name], MD;  [facility]: [facility];  Service: Endoscopy;;   STENT REMOVAL  05/13/2020   Procedure: STENT REMOVAL;  Surgeon: [name], MD;  [facility];  Service: Endoscopy;;   STENT REMOVAL  05/27/2020   Procedure: STENT REMOVAL;  Surgeon: [name], MD;  [facility];  Service: Endoscopy;;   STENT REMOVAL  06/26/2020   Procedure: STENT REMOVAL;  Surgeon: [name]., MD;  Location: [facility];  Service: Gastroenterology;;   Lavell Islam REMOVAL  09/30/2020   Procedure: STENT REMOVAL;  Surgeon: [name]., MD;  [facility];  Service: Gastroenterology;;    Allergies: Dilaudid [hydromorphone] and Talwin [pentazocine]  Medications: Prior to Admission medications   Medication Sig Start Date End Date Taking? Authorizing Provider  acetaminophen (TYLENOL) 500 MG tablet Take 1,000 mg by mouth every 6 (six) hours as needed for moderate pain.   Yes [provider]  AIMOVIG 140 MG/ML SOAJ Inject 140 mg into the skin every 30 (thirty) days. 09/10/20  Yes [provider]  ALPRAZolam Duanne Moron) 0.5 MG tablet Take 0.5 mg by mouth 3 (three) times daily. 08/30/20  Yes [provider]  brexpiprazole (REXULTI) 2 MG TABS tablet Take 2 mg by mouth at bedtime.   Yes [provider]  cyclobenzaprine (FLEXERIL) 5 MG tablet Take 5 mg by mouth 3 (three) times daily as needed. 11/15/20  Yes [provider]  Dulaglutide 4.5 MG/0.5ML SOPN Inject 4.5  mg into the skin every Monday. 12/20/18  Yes [provider]  Empagliflozin-metFORMIN HCl ER (SYNJARDY XR) 25-1000 MG TB24 Take 1 tablet by mouth daily before breakfast.  11/15/18  Yes [provider]  ezetimibe (ZETIA) 10 MG tablet Take 10 mg by mouth daily. 04/25/20  Yes [provider]  Insulin Glargine (BASAGLAR KWIKPEN) 100 UNIT/ML Inject 44 Units into the skin at bedtime. 03/20/20  Yes [provider]  iron polysaccharides (NIFEREX) 150 MG capsule Take 1 capsule (150 mg total) by mouth daily. 10/04/20  Yes Eugenie Filler, MD  lactulose (CHRONULAC) 10 GM/15ML solution Take 15 mLs (10 g total) by mouth 3 (three) times daily. 11/19/20  Yes [name], MD  linaclotide [facility]) 290 MCG CAPS capsule Take 1 capsule (290 mcg total) by mouth daily before breakfast. 08/30/20  Yes [name], MD  lisinopril (ZESTRIL) 40 MG tablet Take 40 mg by mouth every morning. 09/27/20  Yes [provider]  meloxicam (MOBIC) 15 MG tablet Take 1 tablet by mouth daily. 10/15/20  Yes [provider]  metFORMIN (GLUCOPHAGE) 1000 MG tablet Take 1,000 mg  by mouth at bedtime. 02/27/20  Yes [provider]  ondansetron (ZOFRAN ODT) 4 MG disintegrating tablet Take 1 tablet (4 mg total) by mouth every 8 (eight) hours as needed for nausea or vomiting. 11/18/20  Yes Alroy Bailiff, Margaux, PA-C  pantoprazole (PROTONIX) 40 MG tablet Take 40 mg by mouth daily. 08/19/20  Yes [provider]  prazosin (MINIPRESS) 2 MG capsule Take 4 mg by mouth at bedtime.   Yes [provider]  pregabalin (LYRICA) 100 MG capsule One capsule in the morning and midday, 2 in the evening Patient taking differently: Take 100 mg by mouth See admin instructions. Take 100 mg   in the morning and midday, 200 mg in the evening 08/07/20  Yes Kathrynn Ducking, MD  QUEtiapine (SEROQUEL) 400 MG tablet Take 0.5 tablets (200 mg total) by mouth at bedtime. Patient taking differently: Take  800 mg by mouth at bedtime. 07/13/20  Yes [place] Eric J, DO  QUEtiapine (SEROQUEL) 50 MG tablet Take 100 mg by mouth every morning. 08/30/20  Yes [provider]  simvastatin (ZOCOR) 10 MG tablet Take 10 mg by mouth daily. 12/13/17  Yes [provider]  sulfamethoxazole-trimethoprim (BACTRIM DS) 800-160 MG tablet Take 1 tablet by mouth 2 (two) times daily. 11/04/20  Yes [provider]  traMADol ([name]) 50 MG tablet Take 50 mg by mouth 2 (two) times daily as needed for moderate pain. 08/24/20  Yes [provider]  vortioxetine HBr ([name]) 20 MG TABS tablet Take 20 mg by mouth at bedtime.   Yes [provider]  ziprasidone ([name]) 20 MG capsule Take 20 mg by mouth at bedtime. 08/30/20  Yes [provider]  Na Sulfate-K Sulfate-Mg Sulf 17.5-3.13-1.6 GM/177ML SOLN Take per colonoscopy instructions sheet 11/19/20   [name], MD  ondansetron (ZOFRAN-ODT) 4 MG disintegrating tablet Take 4 mg by mouth every 8 (eight) hours as needed for nausea or vomiting.    [provider]  polyethylene glycol (GOLYTELY) 236 g solution Drink one glass of the solution every 15 minutes until gone 11/18/20   Willia Craze, NP     Family History  Problem Relation Age of Onset   Diabetes Mother    Heart failure Mother    Depression Mother    Colon cancer Neg Hx    Stomach cancer Neg Hx    Esophageal cancer Neg Hx    Pancreatic cancer Neg Hx    Colon polyps Neg Hx    Rectal cancer Neg Hx     Social History   Socioeconomic History   Marital status: Married    Spouse name: Not on file   Number of children: Not on file   Years of education: Not on file   Highest education level: Not on file  Occupational History   Not on file  Tobacco Use   Smoking status: Never   Smokeless tobacco: Never  Vaping Use   Vaping Use: Never used  Substance and Sexual Activity   Alcohol use: Never   Drug use: Never   Sexual activity: Not Currently   Other Topics Concern   Not on file  Social History Narrative   Lives with niece and husband   Right handed   Drinks 4-5 cups daily   Social Determinants of Health   Financial Resource Strain: Not on file  Food Insecurity: Not on file  Transportation Needs: Not on file  Physical Activity: Not on file  Stress: Not on file  Social Connections: Not  on file     Review of Systems: A 12 point ROS discussed and pertinent positives are indicated in the HPI above.  All other systems are negative.  Review of Systems  Constitutional: Negative.   Respiratory: Negative.    Cardiovascular: Negative.   Gastrointestinal: Negative.    Vital Signs: BP (!) 149/78   Pulse 96   Temp 98 F (36.7 C) (Oral)   Ht 5' 5"  (1.651 m)   Wt 172 lb (78 kg)   SpO2 99%   BMI 28.62 kg/m   Physical Exam Constitutional:      Appearance: Normal appearance. She is not ill-appearing.  HENT:     Mouth/Throat:     Mouth: Mucous membranes are moist.     Pharynx: Oropharynx is clear.  Cardiovascular:     Rate and Rhythm: Normal rate and regular rhythm.     Heart sounds: Normal heart sounds.  Pulmonary:     Effort: Pulmonary effort is normal. No respiratory distress.     Breath sounds: Normal breath sounds.  Abdominal:     Palpations: Abdomen is soft.     Tenderness: There is no abdominal tenderness.     Comments: Biloma drain intact.  Site clean Thin bilious output in bag  Skin:    General: Skin is warm and dry.  Neurological:     General: No focal deficit present.     Mental Status: She is alert and oriented to person, place, and time.  Psychiatric:        Behavior: Behavior normal.        Judgment: Judgment normal.    Imaging: IR Catheter Tube Change  Result Date: 12/16/2020 INDICATION: History of acute cholecystitis, post ultrasound fluoroscopic guided gallbladder fossa drain placement on 05/20/2020. Patient reports that the drain inadvertently fell out. EXAM: FLUOROSCOPIC GUIDED  GALLBLADDER FOSSA DRAIN TUBE REPLACEMENT COMPARISON:  Image guided biliary drain tube exchange 10/16/2020- MEDICATIONS: None ANESTHESIA/SEDATION: None CONTRAST:  10 mL OMNIPAQUE IOHEXOL 300 MG/ML SOLN - administered into the gallbladder fossa. FLUOROSCOPY TIME:  3 minutes and 12 seconds, 29 mGy COMPLICATIONS: None immediate. PROCEDURE: The patient was positioned supine on the fluoroscopy table. The right upper quadrant was prepped and draped in usual sterile fashion. A time-out was performed prior to the initiation of the procedure. A preprocedural spot fluoroscopic image was obtained of the right upper abdominal quadrant. The skin surrounding the cholecystostomy tube was anesthetized with 1% lidocaine with epinephrine. The pre-existing biliary tract was hydro dissected, then a 5 [place] Kumpe catheter and Glidewire were used to follow pre-existing track into the gallbladder fossa. The Glidewire was removed and contrast injection demonstrated adequate positioning at the gallbladder fossa, with opacification of the cystic duct and common bile duct, with contrast extending to the small bowel. A short Amplatz wire was advanced through Kumpe catheter and coiled within the gallbladder fossa. Next, under intermittent fluoroscopic guidance, the existing 8.5 [place] multipurpose drainage tube was positioned into the gallbladder fossa. The gallbladder fossa drainage tube was flushed with a small amount of saline and reconnected to a gravity bag. The drainage tube was secured with an interrupted suture and a Stat Lock device. A dressing was applied. The patient tolerated the procedure well without immediate postprocedural complication. FINDINGS: Preprocedural spot fluoroscopic image demonstrates unchanged positioning of cholecystostomy tube with end coiled and locked over the expected location of the fundus of the gallbladder After fluoroscopic guided replacement with a new 8.5 [place] multipurpose drainage tube, positioned with  end coiled and locked  within the gallbladder fossa. Post replacement cholangiogram demonstrates appropriate positioning and functionality of the new biliary drainage tube. IMPRESSION: Successful fluoroscopic guided replacement of a 8.5 French gallbladder fossa drainage tube, as above. PLAN: - The patient's gallbladder fossa drainage tube was reconnected to a gravity bag. - The patient may return to the interventional radiology drain clinic in 8 weeks for repeat diagnostic cholangiogram, with possible intervention. The patient knows to call the interventional radiology drain clinic [phone]) with any interval questions or concerns. Electronically Signed   By: Michaelle Birks MD   On: 12/16/2020 17:08   IR EXCHANGE BILIARY DRAIN  Result Date: 11/26/2020 INDICATION: 55 year old female with a history of cholecystectomy complicated by postoperative bile leak. Patient has endoscopically placed biliary stents and required a percutaneous drain placement into the gallbladder fossa. The gallbladder fossa drain has not been removed due to persistent fistulous communication between the drainage catheter and the biliary system. EXAM: Drain exchange MEDICATIONS: None. ANESTHESIA/SEDATION: None. COMPLICATIONS: None immediate. PROCEDURE: Informed written consent was obtained from the patient after a thorough discussion of the procedural risks, benefits and alternatives. All questions were addressed. Maximal Sterile Barrier Technique was utilized including caps, mask, sterile gowns, sterile gloves, sterile drape, hand hygiene and skin antiseptic. A timeout was performed prior to the initiation of the procedure. Contrast was injected through the existing drainage catheter. The injected contrast opacifies a persistent biloma and then communicates with the common hepatic duct. A plastic biliary stent remains in place. The retention suture was cut and the existing drain transected. The drain was then removed over a Bentson wire. A new  Cook 8.5 [place] all-purpose drainage catheter was then advanced over the wire and formed. Images were obtained and stored for the medical record. The catheter was secured to the skin with an adhesive fixation device. IMPRESSION: 1. Persistent fistulous communication between the drainage catheter and the adjacent biliary tree. 2. Successful exchange of 8.5 French drainage catheter in the gallbladder fossa. PLAN: 1. Patient has an appointment with surgery later this month to discuss options. 2. Return Interventional Radiology in 3 months for next drain injection and possible exchange. Electronically Signed   By: Jacqulynn Cadet M.D.   On: 11/26/2020 12:50   IR Radiologist Eval & Mgmt  Result Date: 12/13/2020 Please refer to notes tab for details about interventional procedure. (Op Note)   Labs:  CBC: Recent Labs    10/03/20 0549 11/01/20 1326 11/18/20 0744 12/25/20 0709  WBC 3.8* 6.7 5.8 5.2  HGB 9.0* 11.5* 11.5* 11.6*  HCT 31.6* 38.3 37.9 39.0  PLT 137* 136* 123* 155    COAGS: Recent Labs    05/20/20 2100 06/27/20 1700 11/18/20 0828 12/25/20 0709  INR 1.6* 1.0 1.1 1.0  APTT 58*  --  28 27    BMP: Recent Labs    02/16/20 0520 05/20/20 1152 07/08/20 0528 07/08/20 0630 10/03/20 0549 11/01/20 1326 11/18/20 0744 12/25/20 0709  NA 138   < > QUESTIONABLE RESULTS, RECOMMEND RECOLLECT TO VERIFY   < > 141 140 136 139  K 3.9   < > QUESTIONABLE RESULTS, RECOMMEND RECOLLECT TO VERIFY   < > 3.5 5.1 5.4* 4.8  CL 107   < > QUESTIONABLE RESULTS, RECOMMEND RECOLLECT TO VERIFY   < > 109 108 107 105  CO2 17*   < > QUESTIONABLE RESULTS, RECOMMEND RECOLLECT TO VERIFY   < > 29 23 19* 24  GLUCOSE 339*   < > QUESTIONABLE RESULTS, RECOMMEND RECOLLECT TO VERIFY   < >  163* 204* 149* 394*  BUN 39*   < > QUESTIONABLE RESULTS, RECOMMEND RECOLLECT TO VERIFY   < > 11 30* 31* 29*  CALCIUM 8.1*   < > QUESTIONABLE RESULTS, RECOMMEND RECOLLECT TO VERIFY   < > 8.2* 9.4 9.5 8.9  CREATININE 1.08*   < >  QUESTIONABLE RESULTS, RECOMMEND RECOLLECT TO VERIFY   < > 0.61 0.72 0.89 0.70  GFRNONAA 58*   < > QUESTIONABLE RESULTS, RECOMMEND RECOLLECT TO VERIFY   < > >60 >60 >60 >60  GFRAA >60  --  QUESTIONABLE RESULTS, RECOMMEND RECOLLECT TO VERIFY  --   --   --   --   --    < > = values in this interval not displayed.    LIVER FUNCTION TESTS: Recent Labs    10/03/20 0549 11/01/20 1326 11/18/20 0744 12/25/20 0709  BILITOT 0.4 0.2* 0.5 0.5  AST 39 48* 45* 38  ALT 24 38 31 28  ALKPHOS 117 203* 163* 217*  PROT 6.0* 8.0 8.1 7.1  ALBUMIN 3.0* 4.3 4.5 3.5    TUMOR MARKERS: No results for input(s): AFPTM, CEA, CA199, CHROMGRNA in the last 8760 hours.  Assessment and Plan: 55 year old female with history of chronic biloma with fistulous connection to common bile duct via cystic duct remnant setting status post laparoscopic fenestrated cholecystectomy in March 2021.  She would be an excellent candidate for cystic duct glue embolization to obliterate the chronic biloma.  Plan to leave drain in place for several weeks after glue embolization, followed by capping trial then drain removal if embolization successful.   This plan was discussed along with its risks and benefits, and she is amenable to proceed.     Plan for cystic duct n-BCA glue embolization and biloma drain exchange with moderate sedation at [facility] with same day discharge home.  Labs reviewed.  Risks and benefits discussed with the patient including bleeding, infection, damage to adjacent structures, and sepsis.  All of the patient's questions were answered, patient is agreeable to proceed. Consent signed and in chart.    Thank you for this interesting consult.  I greatly enjoyed meeting LYNDZEE KLIEBERT and look forward to participating in their care.  A copy of this report was sent to the requesting provider on this date.  Electronically Signed: Tyson Alias, NP 12/25/2020, 9:09 AM   I spent a total of 20 mins in face  to face in clinical consultation, greater than 50% of which was counseling/coordinating care for biloma embolization.

## 2020-12-27 ENCOUNTER — Other Ambulatory Visit (HOSPITAL_COMMUNITY): Payer: Self-pay | Admitting: Interventional Radiology

## 2020-12-27 ENCOUNTER — Encounter (HOSPITAL_COMMUNITY): Payer: Self-pay

## 2020-12-27 DIAGNOSIS — K839 Disease of biliary tract, unspecified: Secondary | ICD-10-CM

## 2020-12-27 HISTORY — PX: IR ANGIOGRAM VISCERAL SELECTIVE: IMG657

## 2020-12-27 HISTORY — PX: IR EXCHANGE BILIARY DRAIN: IMG6046

## 2021-01-07 ENCOUNTER — Other Ambulatory Visit: Payer: Self-pay | Admitting: Interventional Radiology

## 2021-01-07 ENCOUNTER — Other Ambulatory Visit (HOSPITAL_COMMUNITY): Payer: Self-pay | Admitting: Interventional Radiology

## 2021-01-07 DIAGNOSIS — K839 Disease of biliary tract, unspecified: Secondary | ICD-10-CM

## 2021-01-12 ENCOUNTER — Other Ambulatory Visit: Payer: Self-pay | Admitting: Neurology

## 2021-01-14 ENCOUNTER — Ambulatory Visit (HOSPITAL_COMMUNITY): Payer: Medicare Other

## 2021-01-16 ENCOUNTER — Other Ambulatory Visit (HOSPITAL_COMMUNITY): Payer: Self-pay | Admitting: Interventional Radiology

## 2021-01-16 ENCOUNTER — Other Ambulatory Visit: Payer: Self-pay

## 2021-01-16 ENCOUNTER — Ambulatory Visit (HOSPITAL_COMMUNITY)
Admission: RE | Admit: 2021-01-16 | Discharge: 2021-01-16 | Disposition: A | Discharge status: Home / Self Care | Payer: Medicare Other | Source: Ambulatory Visit | Attending: Interventional Radiology | Admitting: Interventional Radiology

## 2021-01-16 DIAGNOSIS — K839 Disease of biliary tract, unspecified: Secondary | ICD-10-CM | POA: Diagnosis not present

## 2021-01-16 DIAGNOSIS — K819 Cholecystitis, unspecified: Secondary | ICD-10-CM

## 2021-01-16 DIAGNOSIS — K9189 Other postprocedural complications and disorders of digestive system: Secondary | ICD-10-CM | POA: Insufficient documentation

## 2021-01-16 HISTORY — PX: IR CHOLANGIOGRAM EXISTING TUBE: IMG6040

## 2021-01-16 MED ORDER — IOHEXOL 240 MG/ML SOLN
50.0000 mL | Freq: Once | INTRAMUSCULAR | Status: AC | PRN
  Administered 2021-01-16: 10 mL via INTRAVENOUS

## 2021-01-16 NOTE — Procedures (Signed)
Interventional Radiology Procedure:   Indications: Follow up cystic duct embolization  Procedure: Drain injection  Findings: No filling of CBD.  Cystic duct is occluded.  Complications: No immediate complications noted.     EBL:  None  Plan: Cap tube for 1 week and follow up.   Kanton Kamel R. Anselm Pancoast, MD  Pager: [phone]

## 2021-01-17 ENCOUNTER — Telehealth: Payer: Self-pay

## 2021-01-17 ENCOUNTER — Other Ambulatory Visit: Payer: Self-pay

## 2021-01-17 DIAGNOSIS — K838 Other specified diseases of biliary tract: Secondary | ICD-10-CM

## 2021-01-17 DIAGNOSIS — D509 Iron deficiency anemia, unspecified: Secondary | ICD-10-CM

## 2021-01-17 DIAGNOSIS — K839 Disease of biliary tract, unspecified: Secondary | ICD-10-CM

## 2021-01-17 DIAGNOSIS — R1032 Left lower quadrant pain: Secondary | ICD-10-CM

## 2021-01-17 NOTE — Telephone Encounter (Signed)
Left message on machine to call back  

## 2021-01-17 NOTE — Telephone Encounter (Signed)
-----   Message from [name]., MD sent at 01/16/2021  4:31 PM EDT ----- Regarding: RE: cystic duct glue embolization Dylan, This is great news.  Thanks so much for helping.  This has been a long road for her and we have been able to stave off surgery.  I can move forward with CBD stent removal next available and I will have my team start working on getting that on the books. Edu On, please work on scheduling ERCP stent removal next available.  This is 100-minute case total.  Thanks. GM ----- Message ----- From: Suzette Battiest, MD Sent: 01/16/2021   3:20 PM EDT To: [name]., MD Subject: cystic duct glue embolization                  Dr. Rush Landmark,  Just wanted to pass along a high five case with Mrs. Plaza.    As you probably remember she'd had a difficult to manage biloma leak s/p cholecystectomy for over a year.  On 12/25/20 we glued (with n-BCA) her cystic duct and replaced the biloma drain.  She returned today for a drain check, and the cystic duct remains completely occluded, with minimal output from her drain.  We've started a capping trial, which if she passes she'll finally be drain free.  My suspicion is that CBD stent could be removed anytime at your discretion.  The glue is close to it up top, but don't think it's touching, as she has a very low insertion of her cystic duct.  Thanks again for allowing [place] to help take care of this pt.  I think this technique is solid for these type of cases, let me know if you have any more come up!  Dylan

## 2021-01-17 NOTE — Telephone Encounter (Signed)
ERCP scheduled for 02/24/21 at [facility] with GM at 1145 am

## 2021-01-20 NOTE — Telephone Encounter (Signed)
ERCP scheduled, pt instructed and medications reviewed.  Patient instructions mailed to home.  Patient to call with any questions or concerns.

## 2021-01-24 ENCOUNTER — Ambulatory Visit (HOSPITAL_COMMUNITY)
Admission: RE | Admit: 2021-01-24 | Discharge: 2021-01-24 | Disposition: A | Discharge status: Home / Self Care | Payer: Medicare Other | Source: Ambulatory Visit | Attending: Interventional Radiology | Admitting: Interventional Radiology

## 2021-01-24 ENCOUNTER — Other Ambulatory Visit: Payer: Self-pay | Admitting: Interventional Radiology

## 2021-01-24 ENCOUNTER — Other Ambulatory Visit (HOSPITAL_COMMUNITY): Payer: Self-pay | Admitting: Interventional Radiology

## 2021-01-24 DIAGNOSIS — K819 Cholecystitis, unspecified: Secondary | ICD-10-CM

## 2021-01-24 DIAGNOSIS — Z434 Encounter for attention to other artificial openings of digestive tract: Secondary | ICD-10-CM | POA: Insufficient documentation

## 2021-01-24 HISTORY — PX: IR FLUORO RM 30-60 MIN: IMG2384

## 2021-02-13 ENCOUNTER — Other Ambulatory Visit: Payer: Self-pay | Admitting: Gastroenterology

## 2021-02-13 NOTE — Progress Notes (Signed)
Attempted to obtain medical history via telephone, unable to reach at this time. I left a voicemail to return pre surgical testing department's phone call.  

## 2021-02-14 ENCOUNTER — Telehealth: Payer: Self-pay | Admitting: Gastroenterology

## 2021-02-14 NOTE — Telephone Encounter (Signed)
Inbound call from patient. Requesting a call back to discuss upcoming procedure. Patient states she is scared and needs to know what it is and reassurance.

## 2021-02-14 NOTE — Telephone Encounter (Signed)
Dr Rush Landmark the pt has several questions and concerns about her upcoming ERCP.  She would like for you to call her to discuss.  She is aware that you are out of the office today.

## 2021-02-17 ENCOUNTER — Ambulatory Visit (INDEPENDENT_AMBULATORY_CARE_PROVIDER_SITE_OTHER): Payer: Medicare Other | Admitting: Neurology

## 2021-02-17 ENCOUNTER — Encounter: Payer: Self-pay | Admitting: Neurology

## 2021-02-17 VITALS — BP 114/72 | HR 89 | Ht 65.0 in | Wt 194.0 lb

## 2021-02-17 DIAGNOSIS — G43019 Migraine without aura, intractable, without status migrainosus: Secondary | ICD-10-CM | POA: Diagnosis not present

## 2021-02-17 DIAGNOSIS — R269 Unspecified abnormalities of gait and mobility: Secondary | ICD-10-CM

## 2021-02-17 DIAGNOSIS — G2581 Restless legs syndrome: Secondary | ICD-10-CM | POA: Diagnosis not present

## 2021-02-17 DIAGNOSIS — E1142 Type 2 diabetes mellitus with diabetic polyneuropathy: Secondary | ICD-10-CM

## 2021-02-17 HISTORY — DX: Restless legs syndrome: G25.81

## 2021-02-17 MED ORDER — PRAMIPEXOLE DIHYDROCHLORIDE 0.25 MG PO TABS
0.2500 mg | ORAL_TABLET | Freq: Every day | ORAL | 2 refills | Status: DC
Start: 2021-02-17 — End: 2021-06-17

## 2021-02-17 MED ORDER — PREGABALIN 100 MG PO CAPS: ORAL_CAPSULE | ORAL | 1 refills | Status: AC

## 2021-02-17 MED ORDER — QUETIAPINE FUMARATE 400 MG PO TABS: 800.0000 mg | ORAL_TABLET | Freq: Every day | ORAL | Status: AC

## 2021-02-17 NOTE — Telephone Encounter (Signed)
I called and spoke with the patient this morning. She is concerned that this is the first time in over a year that she has not had a percutaneous drain.  Thankfully interventional radiology was able to ablate the cystic duct and finally decrease the recurrence of this biliary leak. Discussed that if we remove the stent and she has no evidence of a persistent bile leak, her risk of recurrence will be low.  If at the time of biliary cleanout and occlusion cholangiogram she has any evidence of leak we will replace a stent at that time. Patient felt comfortable with this plan of action. She is scheduled for next week. Thanks. GM

## 2021-02-17 NOTE — Progress Notes (Signed)
Reason for visit: Diabetic peripheral neuropathy, migraine headache, gait disorder  Kim Hicks is an 55 y.o. female  History of present illness:  Kim Hicks is a 55 year old right-handed white female with a history of diabetes associated with a diabetic peripheral neuropathy.  The patient has a gait disorder associated with this.  She has had a bout of pancreatitis that occurred in February 2022 with altered mental status.  The patient was noted to have cirrhosis of the liver, she is on chronic lactulose therapy.  The patient has migraine headaches that are intractable.  She may have a couple headaches a month but the headaches may last 2 to 3 days at a time.  She was placed on Aimovig without any benefit.  She fell 3 weeks ago and fractured her left ankle, she is in a brace for this currently.  Over the last several months she has noted some decrease in hand strength, she has difficulty opening jars.  She denies issues with controlling the bowels or the bladder.  She may occasionally have some right shoulder pain but no pain down arms on either side.  She has significant issues with sleeping at night in part related to restless legs.  She has discomfort with her peripheral neuropathy as well.  Initially the increase in Lyrica recently resulted in good benefit, but this benefit has waned.  Past Medical History:  Diagnosis Date   Anxiety    Cataract    REMOVED BOTH   Cirrhosis of liver (HCC)    Common migraine with intractable migraine 08/07/2020   Depression    Diabetes mellitus without complication (HCC)    Dysrhythmia    Edema    Family history of adverse reaction to anesthesia    mother had trouble waking up after surgery   Foot fracture, left    Headache    MIGRAINES   Hyperlipidemia    Hypertension    Neuromuscular disorder (HCC)    NEUROPATHY-both hands and feet   On supplemental oxygen by nasal cannula    2L at bedtime only NO CPAP JUST 02 PER PT   Oxygen deficiency     Palpitations    Sleep apnea    Wheezing     Past Surgical History:  Procedure Laterality Date   ABDOMINAL HYSTERECTOMY     one ovary left   BILIARY STENT PLACEMENT N/A 08/07/2019   Procedure: BILIARY STENT PLACEMENT;  Surgeon: [name], MD;  Location: [facility];  Service: Endoscopy;  Laterality: N/A;   BILIARY STENT PLACEMENT N/A 11/07/2019   Procedure: BILIARY STENT PLACEMENT;  Surgeon: [name], MD;  Location: [facility];  Service: Endoscopy;  Laterality: N/A;   BILIARY STENT PLACEMENT N/A 05/13/2020   Procedure: BILIARY STENT PLACEMENT;  Surgeon: [name], MD;  [facility];  Service: Endoscopy;  Laterality: N/A;   BILIARY STENT PLACEMENT N/A 05/27/2020   Procedure: BILIARY STENT PLACEMENT;  Surgeon: [name], MD;  [facility];  Service: Endoscopy;  Laterality: N/A;   BILIARY STENT PLACEMENT N/A 06/26/2020   Procedure: BILIARY STENT PLACEMENT;  Surgeon: [name], MD;  [facility];  Service: Gastroenterology;  Laterality: N/A;   BILIARY STENT PLACEMENT N/A 09/30/2020   Procedure: BILIARY STENT PLACEMENT;  Surgeon: [name], MD;  [facility];  Service: Gastroenterology;  Laterality: N/A;   BIOPSY  06/26/2020   Procedure: BIOPSY;  Surgeon: [name], MD;  [facility];  Service: Gastroenterology;;  CATARACT EXTRACTION W/PHACO Right 01/11/2018   Procedure: CATARACT EXTRACTION PHACO AND INTRAOCULAR LENS PLACEMENT (IOC);  Surgeon: [name], MD;  Location: [facility];  Service: Ophthalmology;  Laterality: Right;  [place] 00:57.5 AP% 15.0 CDE 8.61 Fluid Pack Lot # [id number]   CATARACT EXTRACTION W/PHACO Left 02/15/2018   Procedure: CATARACT EXTRACTION PHACO AND INTRAOCULAR LENS PLACEMENT ([place]);  Surgeon: [name], MD;  Location: [facility];  Service: Ophthalmology;  Laterality: Left;  [place] 00:51 AP% 13.2 CDE 6.82 Fluid p ack lot # [id number]   CHOLECYSTECTOMY N/A  08/04/2019   Procedure: LAPAROSCOPIC CHOLECYSTECTOMY;  Surgeon: [name], MD;  Location: [facility];  Service: General;  Laterality: N/A;   COLONOSCOPY     COLONOSCOPY WITH PROPOFOL N/A 12/02/2020   Procedure: COLONOSCOPY WITH PROPOFOL;  Surgeon: [name], MD;  [facility];  Service: Endoscopy;  Laterality: N/A;   ENDOSCOPIC RETROGRADE CHOLANGIOPANCREATOGRAPHY (ERCP) WITH PROPOFOL N/A 11/07/2019   Procedure: ENDOSCOPIC RETROGRADE CHOLANGIOPANCREATOGRAPHY (ERCP) WITH PROPOFOL;  Surgeon: [name], MD;  [facility];  Service: Endoscopy;  Laterality: N/A;   ENDOSCOPIC RETROGRADE CHOLANGIOPANCREATOGRAPHY (ERCP) WITH PROPOFOL N/A 05/13/2020   Procedure: ENDOSCOPIC RETROGRADE CHOLANGIOPANCREATOGRAPHY (ERCP) WITH PROPOFOL;  Surgeon: [name], MD;  [facility];  Service: Endoscopy;  Laterality: N/A;   ENDOSCOPIC RETROGRADE CHOLANGIOPANCREATOGRAPHY (ERCP) WITH PROPOFOL N/A 06/26/2020   Procedure: ENDOSCOPIC RETROGRADE CHOLANGIOPANCREATOGRAPHY (ERCP) WITH PROPOFOL;  Surgeon: [name], MD;  [facility];  Service: Gastroenterology;  Laterality: N/A;   ENDOSCOPIC RETROGRADE CHOLANGIOPANCREATOGRAPHY (ERCP) WITH PROPOFOL N/A 09/30/2020   Procedure: ENDOSCOPIC RETROGRADE CHOLANGIOPANCREATOGRAPHY (ERCP) WITH PROPOFOL;  Surgeon: [name], MD;  [facility];  Service: Gastroenterology;  Laterality: N/A;   ERCP N/A 08/07/2019   Procedure: ENDOSCOPIC RETROGRADE CHOLANGIOPANCREATOGRAPHY (ERCP);  Surgeon: [name], MD;  Location: [facility];  Service: Endoscopy;  Laterality: N/A;   ERCP N/A 05/27/2020   Procedure: ENDOSCOPIC RETROGRADE CHOLANGIOPANCREATOGRAPHY (ERCP);  Surgeon: [name], MD;  Location: [facility];  Service: Endoscopy;  Laterality: N/A;   ESOPHAGOGASTRODUODENOSCOPY N/A 06/26/2020   Procedure: ESOPHAGOGASTRODUODENOSCOPY (EGD);  Surgeon: [name]., MD;  Location: [facility];  Service: Gastroenterology;  Laterality: N/A;   ESOPHAGOGASTRODUODENOSCOPY (EGD) WITH PROPOFOL N/A 12/02/2020   Procedure: ESOPHAGOGASTRODUODENOSCOPY (EGD) WITH PROPOFOL;  Surgeon: [name], MD;  [facility];  Service: Endoscopy;  Laterality: N/A;   HOT HEMOSTASIS N/A 12/02/2020   Procedure: HOT HEMOSTASIS (ARGON PLASMA COAGULATION/BICAP);  Surgeon: [name], MD;  Location: [facility];  Service: Endoscopy;  Laterality: N/A;   IR ANGIOGRAM VISCERAL SELECTIVE  12/27/2020   IR CATHETER TUBE CHANGE  12/16/2020   IR CHOLANGIOGRAM EXISTING TUBE  10/16/2020   IR CHOLANGIOGRAM EXISTING TUBE  01/16/2021   IR EMBO VENOUS NOT HEMORR HEMANG  INC GUIDE ROADMAPPING  12/25/2020   IR EXCHANGE BILIARY DRAIN  07/10/2020   IR EXCHANGE BILIARY DRAIN  08/28/2020   IR EXCHANGE BILIARY DRAIN  11/26/2020   IR EXCHANGE BILIARY DRAIN  12/27/2020   IR FLUORO RM 30-60 MIN  01/24/2021   IR RADIOLOGIST EVAL & MGMT  06/20/2020   IR RADIOLOGIST EVAL & MGMT  12/13/2020   POLYPECTOMY  12/02/2020   Procedure: POLYPECTOMY;  Surgeon: [name], MD;  [facility];  Service: Endoscopy;;   RCR Bilateral    REMOVAL OF STONES  08/07/2019   Procedure: REMOVAL OF STONES;  Surgeon: [name], MD;  [facility];  Service: Endoscopy;;  balloon sweep, no stones   REMOVAL  OF STONES  05/27/2020   Procedure: REMOVAL OF STONES;  Surgeon: [name], MD;  [facility];  Service: Endoscopy;;   REMOVAL OF STONES  06/26/2020   Procedure: REMOVAL OF STONES;  Surgeon: [name]., MD;  Location: [facility];  Service: Gastroenterology;;   REMOVAL OF STONES  09/30/2020   Procedure: REMOVAL OF STONES;  Surgeon: [name]., MD;  Location: [facility];  Service: Gastroenterology;;   [name]  08/07/2019   Procedure: SPHINCTEROTOMY;  Surgeon: [name], MD;  [facility];  Service: Endoscopy;;   STENT REMOVAL  11/07/2019   Procedure: STENT  REMOVAL;  Surgeon: [name], MD;  [facility];  Service: Endoscopy;;   STENT REMOVAL  05/13/2020   Procedure: STENT REMOVAL;  Surgeon: [name], MD;  [facility];  Service: Endoscopy;;   STENT REMOVAL  05/27/2020   Procedure: STENT REMOVAL;  Surgeon: [name], MD;  [facility];  Service: Endoscopy;;   STENT REMOVAL  06/26/2020   Procedure: STENT REMOVAL;  Surgeon: [name]., MD;  Location: [facility];  Service: Gastroenterology;;   STENT REMOVAL  09/30/2020   Procedure: STENT REMOVAL;  Surgeon: [name]., MD;  [facility];  Service: Gastroenterology;;    Family History  Problem Relation Age of Onset   Diabetes Mother    Heart failure Mother    Depression Mother    Colon cancer Neg Hx    Stomach cancer Neg Hx    Esophageal cancer Neg Hx    Pancreatic cancer Neg Hx    Colon polyps Neg Hx    Rectal cancer Neg Hx     Social history:  reports that she has never smoked. She has never used smokeless tobacco. She reports that she does not drink alcohol and does not use drugs.    Allergies  Allergen Reactions   Dilaudid [Hydromorphone] Hives   Talwin [Pentazocine]     Turned blue around lips and face, rash all over body    Medications:  Prior to Admission medications   Medication Sig Start Date End Date Taking? Authorizing Provider  acetaminophen (TYLENOL) 500 MG tablet Take 1,000 mg by mouth every 6 (six) hours as needed for moderate pain.   Yes [provider]  AIMOVIG 140 MG/ML SOAJ INJECT 140 MG INTO THE SKIN EVERY 30 (THIRTY) DAYS. 01/13/21  Yes Kathrynn Ducking, MD  ALPRAZolam Duanne Moron) 0.5 MG tablet Take 0.5 mg by mouth 3 (three) times daily. 08/30/20  Yes [provider]  brexpiprazole (REXULTI) 2 MG TABS tablet Take 2 mg by mouth at bedtime.   Yes [provider]  Dulaglutide 4.5 MG/0.5ML SOPN Inject 4.5 mg into the skin every Monday. 12/20/18  Yes [provider]  Empagliflozin-metFORMIN HCl ER (SYNJARDY XR) 25-1000 MG TB24 Take 1 tablet by mouth daily before breakfast.  11/15/18  Yes [provider]  ezetimibe (ZETIA) 10 MG tablet Take 10 mg by mouth daily. 04/25/20  Yes [provider]  Insulin Glargine (BASAGLAR KWIKPEN) 100 UNIT/ML Inject 44 Units into the skin at bedtime. 03/20/20  Yes [provider]  iron polysaccharides (NIFEREX) 150 MG capsule Take 1 capsule (150 mg total) by mouth daily. 10/04/20  Yes Eugenie Filler, MD  lactulose (CHRONULAC) 10 GM/15ML solution Take 15 mLs (10 g total) by mouth 3 (three) times daily. 11/19/20  Yes [name], MD  LINZESS 290 MCG CAPS capsule TAKE 1 CAPSULE BY MOUTH DAILY  BEFORE BREAKFAST. 02/13/21  Yes [name], MD  lisinopril (ZESTRIL) 40 MG tablet Take 40 mg by mouth every morning. 09/27/20  Yes [provider]  meloxicam (MOBIC) 15 MG tablet Take 1 tablet by mouth daily. 10/15/20  Yes [provider]  metFORMIN (GLUCOPHAGE) 1000 MG tablet Take 1,000 mg by mouth at bedtime. 02/27/20  Yes [provider]  Na Sulfate-K Sulfate-Mg Sulf 17.5-3.13-1.6 GM/177ML SOLN Take per colonoscopy instructions sheet 11/19/20  Yes [name], MD  ondansetron (ZOFRAN ODT) 4 MG disintegrating tablet Take 1 tablet (4 mg total) by mouth every 8 (eight) hours as needed for nausea or vomiting. 11/18/20  Yes Alroy Bailiff, Margaux, PA-C  ondansetron (ZOFRAN-ODT) 4 MG disintegrating tablet Take 4 mg by mouth every 8 (eight) hours as needed for nausea or vomiting.   Yes [provider]  pantoprazole (PROTONIX) 40 MG tablet Take 40 mg by mouth daily. 08/19/20  Yes [provider]  polyethylene glycol (GOLYTELY) 236 g solution Drink one glass of the solution every 15 minutes until gone 11/18/20  Yes Willia Craze, NP  prazosin (MINIPRESS) 2 MG capsule Take 4 mg by mouth at bedtime.   Yes [provider]  pregabalin (LYRICA) 100 MG capsule  One capsule in the morning and midday, 2 in the evening Patient taking differently: Take 100 mg by mouth See admin instructions. Take 100 mg   in the morning and midday, 200 mg in the evening 08/07/20  Yes Kathrynn Ducking, MD  QUEtiapine (SEROQUEL) 400 MG tablet Take 0.5 tablets (200 mg total) by mouth at bedtime. Patient taking differently: Take 800 mg by mouth at bedtime. 07/13/20  Yes [place] Eric J, DO  QUEtiapine (SEROQUEL) 50 MG tablet Take 100 mg by mouth every morning. 08/30/20  Yes [provider]  simvastatin (ZOCOR) 10 MG tablet Take 10 mg by mouth daily. 12/13/17  Yes [provider]  sulfamethoxazole-trimethoprim (BACTRIM DS) 800-160 MG tablet Take 1 tablet by mouth 2 (two) times daily. 11/04/20  Yes [provider]  traMADol ([name]) 50 MG tablet Take 50 mg by mouth 2 (two) times daily as needed for moderate pain. 08/24/20  Yes [provider]  vortioxetine HBr ([name]) 20 MG TABS tablet Take 20 mg by mouth at bedtime.   Yes [provider]  ziprasidone ([name]) 20 MG capsule Take 20 mg by mouth at bedtime. 08/30/20  Yes [provider]    ROS:  Out of a complete 14 system review of symptoms, the patient complains only of the following symptoms, and all other reviewed systems are negative.  Walking difficulty Neuropathy pain Restless legs Headache  Blood pressure 114/72, pulse 89, height 5' 5"  (1.651 m), weight 194 lb (88 kg).  Physical Exam  General: The patient is alert and cooperative at the time of the examination.  The patient is moderately obese.  Skin: No significant peripheral edema is noted.  The patient has a brace on the left lower leg and ankle.   Neurologic Exam  Mental status: The patient is alert and oriented x 3 at the time of the examination. The patient has apparent normal recent and remote memory, with an apparently normal attention span and concentration ability.   Cranial nerves: Facial symmetry  is present. Speech is normal, no aphasia or dysarthria is noted. Extraocular movements are full. Visual fields are full.  Motor: The patient has good strength in all 4 extremities, with exception some mild weakness of intrinsic muscles of the hands  bilaterally, mild atrophy of the first dorsal interossei is noted.  Sensory examination: Soft touch sensation is symmetric on the face, arms, and legs.  Coordination: The patient has good finger-nose-finger and heel-to-shin bilaterally.  Gait and station: The patient has a limited ability to walk, she has a brace on the left foot, has a limping gait on the leg, slightly wide-based gait.  She is able to walk independently.  Reflexes: Deep tendon reflexes are symmetric.   Assessment/Plan:  1.  Diabetic peripheral neuropathy  2.  Intractable migraine headache  3.  Gait disorder  4.  Restless leg syndrome  The patient may require physical therapy in the future, once the brace comes off she will call our office and we will get her set up for physical therapy for gait training.  We will go up on the Lyrica taking 200 mg in the morning and evening, 100 mg at midday.  She will be given a small prescription for Mirapex taking the 0.25 mg tablets at night for restless legs.  She will stop the Aimovig.  She will follow-up here in 4 months.  In the future, she can be followed through Dr. Krista Blue.  Jill Alexanders MD 02/17/2021 10:22 AM  [facility] [address] [place], [place] [address]  Phone [phone] Fax [phone]

## 2021-02-18 ENCOUNTER — Ambulatory Visit
Admission: RE | Admit: 2021-02-18 | Discharge: 2021-02-18 | Disposition: A | Discharge status: Home / Self Care | Payer: Medicare Other | Source: Ambulatory Visit | Attending: Interventional Radiology | Admitting: Interventional Radiology

## 2021-02-18 ENCOUNTER — Other Ambulatory Visit (HOSPITAL_COMMUNITY): Payer: Medicare Other

## 2021-02-18 ENCOUNTER — Other Ambulatory Visit: Payer: Self-pay

## 2021-02-18 ENCOUNTER — Encounter: Payer: Self-pay | Admitting: *Deleted

## 2021-02-18 DIAGNOSIS — K819 Cholecystitis, unspecified: Secondary | ICD-10-CM

## 2021-02-18 HISTORY — PX: IR RADIOLOGIST EVAL & MGMT: IMG5224

## 2021-02-18 NOTE — Progress Notes (Signed)
Reason for followup: Status post cystic duct glue embolization  She presents today via telephone virtual visit.  History of present illness: 55 year old female with history of complex postoperative course after laparoscopic partial cholecystectomy requiring >1 year of indwelling biloma drain without any resolution.  Now status post cystic duct glue (n-BCA) embolization on 12/25/20 and biloma drain removal on 01/24/21.  She has done well since removal without fevers, chills, abdominal pain, nausea, vomiting.    She expresses anxiety regarding forthcoming ERCP stent removal with Dr. Rush Landmark.  I reassured her this is appropriate and will not affect the now embolized cystic duct remnant.  Past Medical History:  Diagnosis Date   Anxiety    Cataract    REMOVED BOTH   Cirrhosis of liver (HCC)    Common migraine with intractable migraine 08/07/2020   Depression    Diabetes mellitus without complication (HCC)    Dysrhythmia    Edema    Family history of adverse reaction to anesthesia    mother had trouble waking up after surgery   Foot fracture, left    Headache    MIGRAINES   Hyperlipidemia    Hypertension    Neuromuscular disorder (HCC)    NEUROPATHY-both hands and feet   On supplemental oxygen by nasal cannula    2L at bedtime only NO CPAP JUST 02 PER PT   Oxygen deficiency    Palpitations    RLS (restless legs syndrome) 02/17/2021   Sleep apnea    Wheezing     Past Surgical History:  Procedure Laterality Date   ABDOMINAL HYSTERECTOMY     one ovary left   BILIARY STENT PLACEMENT N/A 08/07/2019   Procedure: BILIARY STENT PLACEMENT;  Surgeon: [name], MD;  Location: [facility];  Service: Endoscopy;  Laterality: N/A;   BILIARY STENT PLACEMENT N/A 11/07/2019   Procedure: BILIARY STENT PLACEMENT;  Surgeon: [name], MD;  Location: [facility];  Service: Endoscopy;  Laterality: N/A;   BILIARY STENT PLACEMENT N/A 05/13/2020   Procedure: BILIARY STENT PLACEMENT;   Surgeon: [name], MD;  Location: [facility];  Service: Endoscopy;  Laterality: N/A;   BILIARY STENT PLACEMENT N/A 05/27/2020   Procedure: BILIARY STENT PLACEMENT;  Surgeon: [name], MD;  Location: [facility];  Service: Endoscopy;  Laterality: N/A;   BILIARY STENT PLACEMENT N/A 06/26/2020   Procedure: BILIARY STENT PLACEMENT;  Surgeon: [name], MD;  Location: [facility];  Service: Gastroenterology;  Laterality: N/A;   BILIARY STENT PLACEMENT N/A 09/30/2020   Procedure: BILIARY STENT PLACEMENT;  Surgeon: [name], MD;  [facility]: [facility];  Service: Gastroenterology;  Laterality: N/A;   BIOPSY  06/26/2020   Procedure: BIOPSY;  Surgeon: [name], MD;  Location: [facility];  Service: Gastroenterology;;   CATARACT EXTRACTION W/PHACO Right 01/11/2018   Procedure: CATARACT EXTRACTION PHACO AND INTRAOCULAR LENS PLACEMENT ([place]);  Surgeon: [name], MD;  Location: [facility];  Service: Ophthalmology;  Laterality: Right;  [place] 00:57.5 AP% 15.0 CDE 8.61 Fluid Pack Lot # [id number]   CATARACT EXTRACTION W/PHACO Left 02/15/2018   Procedure: CATARACT EXTRACTION PHACO AND INTRAOCULAR LENS PLACEMENT ([place]);  Surgeon: [name], MD;  Location: [facility];  Service: Ophthalmology;  Laterality: Left;  [place] 00:51 AP% 13.2 CDE 6.82 Fluid p ack lot # [id number]   CHOLECYSTECTOMY N/A 08/04/2019   Procedure: LAPAROSCOPIC CHOLECYSTECTOMY;  Surgeon: [name], MD;  Location: [facility];  Service: General;  Laterality: N/A;   COLONOSCOPY  COLONOSCOPY WITH PROPOFOL N/A 12/02/2020   Procedure: COLONOSCOPY WITH PROPOFOL;  Surgeon: [name], MD;  [facility]: [facility];  Service: Endoscopy;  Laterality: N/A;   ENDOSCOPIC RETROGRADE CHOLANGIOPANCREATOGRAPHY (ERCP) WITH PROPOFOL N/A 11/07/2019   Procedure: ENDOSCOPIC RETROGRADE CHOLANGIOPANCREATOGRAPHY (ERCP) WITH PROPOFOL;  Surgeon: [name], MD;  [facility]: [facility];  Service:  Endoscopy;  Laterality: N/A;   ENDOSCOPIC RETROGRADE CHOLANGIOPANCREATOGRAPHY (ERCP) WITH PROPOFOL N/A 05/13/2020   Procedure: ENDOSCOPIC RETROGRADE CHOLANGIOPANCREATOGRAPHY (ERCP) WITH PROPOFOL;  Surgeon: [name], MD;  [facility]: [facility];  Service: Endoscopy;  Laterality: N/A;   ENDOSCOPIC RETROGRADE CHOLANGIOPANCREATOGRAPHY (ERCP) WITH PROPOFOL N/A 06/26/2020   Procedure: ENDOSCOPIC RETROGRADE CHOLANGIOPANCREATOGRAPHY (ERCP) WITH PROPOFOL;  Surgeon: [name], MD;  [facility]: [facility];  Service: Gastroenterology;  Laterality: N/A;   ENDOSCOPIC RETROGRADE CHOLANGIOPANCREATOGRAPHY (ERCP) WITH PROPOFOL N/A 09/30/2020   Procedure: ENDOSCOPIC RETROGRADE CHOLANGIOPANCREATOGRAPHY (ERCP) WITH PROPOFOL;  Surgeon: [name], MD;  [facility];  Service: Gastroenterology;  Laterality: N/A;   ERCP N/A 08/07/2019   Procedure: ENDOSCOPIC RETROGRADE CHOLANGIOPANCREATOGRAPHY (ERCP);  Surgeon: [name], MD;  Location: [facility];  Service: Endoscopy;  Laterality: N/A;   ERCP N/A 05/27/2020   Procedure: ENDOSCOPIC RETROGRADE CHOLANGIOPANCREATOGRAPHY (ERCP);  Surgeon: [name], MD;  Location: [facility];  Service: Endoscopy;  Laterality: N/A;   ESOPHAGOGASTRODUODENOSCOPY N/A 06/26/2020   Procedure: ESOPHAGOGASTRODUODENOSCOPY (EGD);  Surgeon: [name]., MD;  Location: [facility];  Service: Gastroenterology;  Laterality: N/A;   ESOPHAGOGASTRODUODENOSCOPY (EGD) WITH PROPOFOL N/A 12/02/2020   Procedure: ESOPHAGOGASTRODUODENOSCOPY (EGD) WITH PROPOFOL;  Surgeon: [name], MD;  [facility];  Service: Endoscopy;  Laterality: N/A;   HOT HEMOSTASIS N/A 12/02/2020   Procedure: HOT HEMOSTASIS (ARGON PLASMA COAGULATION/BICAP);  Surgeon: [name], MD;  Location: [facility];  Service: Endoscopy;  Laterality: N/A;   IR ANGIOGRAM VISCERAL SELECTIVE  12/27/2020   IR CATHETER TUBE CHANGE  12/16/2020   IR CHOLANGIOGRAM EXISTING  TUBE  10/16/2020   IR CHOLANGIOGRAM EXISTING TUBE  01/16/2021   IR EMBO VENOUS NOT HEMORR HEMANG  INC GUIDE ROADMAPPING  12/25/2020   IR EXCHANGE BILIARY DRAIN  07/10/2020   IR EXCHANGE BILIARY DRAIN  08/28/2020   IR EXCHANGE BILIARY DRAIN  11/26/2020   IR EXCHANGE BILIARY DRAIN  12/27/2020   IR FLUORO RM 30-60 MIN  01/24/2021   IR RADIOLOGIST EVAL & MGMT  06/20/2020   IR RADIOLOGIST EVAL & MGMT  12/13/2020   IR RADIOLOGIST EVAL & MGMT  02/18/2021   POLYPECTOMY  12/02/2020   Procedure: POLYPECTOMY;  Surgeon: [name], MD;  [facility]: [facility];  Service: Endoscopy;;   RCR Bilateral    REMOVAL OF STONES  08/07/2019   Procedure: REMOVAL OF STONES;  Surgeon: [name], MD;  [facility]: [facility];  Service: Endoscopy;;  balloon sweep, no stones   REMOVAL OF STONES  05/27/2020   Procedure: REMOVAL OF STONES;  Surgeon: [name], MD;  [facility]: [facility];  Service: Endoscopy;;   REMOVAL OF STONES  06/26/2020   Procedure: REMOVAL OF STONES;  Surgeon: [name]., MD;  Location: [facility];  Service: Gastroenterology;;   REMOVAL OF STONES  09/30/2020   Procedure: REMOVAL OF STONES;  Surgeon: [name]., MD;  Location: [facility];  Service: Gastroenterology;;   [name]  08/07/2019   Procedure: [name];  Surgeon: [name], MD;  [facility]: [facility];  Service: Endoscopy;;   STENT REMOVAL  11/07/2019   Procedure: STENT REMOVAL;  Surgeon: [name],  MD;  [facility]: [facility];  Service: Endoscopy;;   STENT REMOVAL  05/13/2020   Procedure: STENT REMOVAL;  Surgeon: [name], MD;  [facility];  Service: Endoscopy;;   STENT REMOVAL  05/27/2020   Procedure: STENT REMOVAL;  Surgeon: [name], MD;  [facility];  Service: Endoscopy;;   STENT REMOVAL  06/26/2020   Procedure: STENT REMOVAL;  Surgeon: [name]., MD;  Location: [facility];  Service: Gastroenterology;;   Lavell Islam REMOVAL  09/30/2020    Procedure: STENT REMOVAL;  Surgeon: [name]., MD;  [facility]: [facility];  Service: Gastroenterology;;    Allergies: Dilaudid [hydromorphone] and Talwin [pentazocine]  Medications: Prior to Admission medications   Medication Sig Start Date End Date Taking? Authorizing Provider  acetaminophen (TYLENOL) 500 MG tablet Take 1,000 mg by mouth every 6 (six) hours as needed for moderate pain.    [provider]  ALPRAZolam Duanne Moron) 0.5 MG tablet Take 0.5 mg by mouth 3 (three) times daily. 08/30/20   [provider]  betamethasone valerate (VALISONE) 0.1 % cream Apply 1 application topically 3 (three) times daily.    [provider]  brexpiprazole (REXULTI) 2 MG TABS tablet Take 2 mg by mouth at bedtime.    [provider]  Docusate Calcium (STOOL SOFTENER PO) Take 2 tablets by mouth daily.    [provider]  Dulaglutide 4.5 MG/0.5ML SOPN Inject 4.5 mg into the skin every Monday. 12/20/18   [provider]  DULoxetine (CYMBALTA) 30 MG capsule Take 30 mg by mouth daily.    [provider]  Empagliflozin-metFORMIN HCl ER (SYNJARDY XR) 25-1000 MG TB24 Take 1 tablet by mouth 2 (two) times daily. 11/15/18   [provider]  ezetimibe (ZETIA) 10 MG tablet Take 10 mg by mouth daily. 04/25/20   [provider]  Insulin Glargine (BASAGLAR KWIKPEN) 100 UNIT/ML Inject 40 Units into the skin at bedtime. 03/20/20   [provider]  iron polysaccharides (NIFEREX) 150 MG capsule Take 1 capsule (150 mg total) by mouth daily. Patient not taking: Reported on 02/18/2021 10/04/20   Eugenie Filler, MD  lactulose (CHRONULAC) 10 GM/15ML solution Take 15 mLs (10 g total) by mouth 3 (three) times daily. 11/19/20   [name], MD  LINZESS 290 MCG CAPS capsule TAKE 1 CAPSULE BY MOUTH DAILY BEFORE BREAKFAST. 02/13/21   [name], MD  lisinopril (ZESTRIL) 40 MG tablet Take 40 mg by mouth every morning. 09/27/20    [provider]  meloxicam (MOBIC) 15 MG tablet Take 15 mg by mouth daily. 10/15/20   [provider]  Na Sulfate-K Sulfate-Mg Sulf 17.5-3.13-1.6 GM/177ML SOLN Take per colonoscopy instructions sheet 11/19/20   [name], MD  Omega-3 1000 MG CAPS Take 1,000 mg by mouth daily.    [provider]  ondansetron (ZOFRAN ODT) 4 MG disintegrating tablet Take 1 tablet (4 mg total) by mouth every 8 (eight) hours as needed for nausea or vomiting. 11/18/20   Alroy Bailiff, [place], PA-C  pantoprazole (PROTONIX) 20 MG tablet Take 20 mg by mouth daily. 08/19/20   [provider]  polyethylene glycol (GOLYTELY) 236 g solution Drink one glass of the solution every 15 minutes until gone 11/18/20   Willia Craze, NP  pramipexole (MIRAPEX) 0.25 MG tablet Take 1 tablet (0.25 mg total) by mouth at bedtime. 02/17/21   Kathrynn Ducking, MD  prazosin (MINIPRESS) 2 MG capsule Take 4 mg by mouth at bedtime.  [provider]  pregabalin (LYRICA) 100 MG capsule Two capsules in the morning and evening, one at midday Patient taking differently: Take 100-200 mg by mouth See admin instructions. Take 100 mg in the morning and midday and 200 mg at bedtime 02/17/21   Kathrynn Ducking, MD  QUEtiapine (SEROQUEL) 400 MG tablet Take 2 tablets (800 mg total) by mouth at bedtime. 02/17/21   Kathrynn Ducking, MD  QUEtiapine (SEROQUEL) 50 MG tablet Take 100 mg by mouth every morning. 08/30/20   [provider]  simvastatin (ZOCOR) 10 MG tablet Take 10 mg by mouth daily. 12/13/17   [provider]  terbinafine (LAMISIL) 250 MG tablet Take 250 mg by mouth daily.    [provider]  traMADol ([name]) 50 MG tablet Take 50 mg by mouth 3 (three) times daily. 08/24/20   [provider]  vortioxetine HBr ([name]) 20 MG TABS tablet Take 20 mg by mouth at bedtime.    [provider]  ziprasidone ([name]) 20 MG capsule Take 20 mg by mouth at bedtime. 08/30/20    [provider]     Family History  Problem Relation Age of Onset   Diabetes Mother    Heart failure Mother    Depression Mother    Colon cancer Neg Hx    Stomach cancer Neg Hx    Esophageal cancer Neg Hx    Pancreatic cancer Neg Hx    Colon polyps Neg Hx    Rectal cancer Neg Hx     Social History   Socioeconomic History   Marital status: Married    Spouse name: Not on file   Number of children: Not on file   Years of education: Not on file   Highest education level: Not on file  Occupational History   Not on file  Tobacco Use   Smoking status: Never   Smokeless tobacco: Never  Vaping Use   Vaping Use: Never used  Substance and Sexual Activity   Alcohol use: Never   Drug use: Never   Sexual activity: Not Currently  Other Topics Concern   Not on file  Social History Narrative   Lives with niece and husband   Right handed   Drinks 4-5 cups daily   Social Determinants of Health   Financial Resource Strain: Not on file  Food Insecurity: Not on file  Transportation Needs: Not on file  Physical Activity: Not on file  Stress: Not on file  Social Connections: Not on file     Vital Signs: There were no vitals taken for this visit.  No physical examination in lieu of telephone visit.  Imaging: Tube removal (01/24/21)   Labs:  CBC: Recent Labs    10/03/20 0549 11/01/20 1326 11/18/20 0744 12/25/20 0709  WBC 3.8* 6.7 5.8 5.2  HGB 9.0* 11.5* 11.5* 11.6*  HCT 31.6* 38.3 37.9 39.0  PLT 137* 136* 123* 155    COAGS: Recent Labs    05/20/20 2100 06/27/20 1700 11/18/20 0828 12/25/20 0709  INR 1.6* 1.0 1.1 1.0  APTT 58*  --  28 27    BMP: Recent Labs    07/08/20 0528 07/08/20 0630 10/03/20 0549 11/01/20 1326 11/18/20 0744 12/25/20 0709  NA QUESTIONABLE RESULTS, RECOMMEND RECOLLECT TO VERIFY   < > 141 140 136 139  K QUESTIONABLE RESULTS, RECOMMEND RECOLLECT TO VERIFY   < > 3.5 5.1 5.4* 4.8  CL QUESTIONABLE RESULTS, RECOMMEND  RECOLLECT TO VERIFY   < > 109 108 107  105  CO2 QUESTIONABLE RESULTS, RECOMMEND RECOLLECT TO VERIFY   < > 29 23 19* 24  GLUCOSE QUESTIONABLE RESULTS, RECOMMEND RECOLLECT TO VERIFY   < > 163* 204* 149* 394*  BUN QUESTIONABLE RESULTS, RECOMMEND RECOLLECT TO VERIFY   < > 11 30* 31* 29*  CALCIUM QUESTIONABLE RESULTS, RECOMMEND RECOLLECT TO VERIFY   < > 8.2* 9.4 9.5 8.9  CREATININE QUESTIONABLE RESULTS, RECOMMEND RECOLLECT TO VERIFY   < > 0.61 0.72 0.89 0.70  GFRNONAA QUESTIONABLE RESULTS, RECOMMEND RECOLLECT TO VERIFY   < > >60 >60 >60 >60  GFRAA QUESTIONABLE RESULTS, RECOMMEND RECOLLECT TO VERIFY  --   --   --   --   --    < > = values in this interval not displayed.    LIVER FUNCTION TESTS: Recent Labs    10/03/20 0549 11/01/20 1326 11/18/20 0744 12/25/20 0709  BILITOT 0.4 0.2* 0.5 0.5  AST 39 48* 45* 38  ALT 24 38 31 28  ALKPHOS 117 203* 163* 217*  PROT 6.0* 8.0 8.1 7.1  ALBUMIN 3.0* 4.3 4.5 3.5    Assessment and Plan:  55 year old female with history of non-healing post-operative biloma now status post cystic duct glue embolization and biloma drain removal.  She has tolerated this very well without any clinical evidence of persistent bile leak or cholangitis.  Follow up as needed with IR.  Electronically Signed: Suzette Battiest 02/18/2021, 3:59 PM   I spent a total of 15 Minutes in telephone clinical consultation, greater than 50% of which was counseling/coordinating care for biloma.

## 2021-02-24 ENCOUNTER — Ambulatory Visit (HOSPITAL_COMMUNITY): Payer: Medicare Other | Admitting: Certified Registered"

## 2021-02-24 ENCOUNTER — Encounter (HOSPITAL_COMMUNITY): Payer: Self-pay | Admitting: Gastroenterology

## 2021-02-24 ENCOUNTER — Ambulatory Visit (HOSPITAL_COMMUNITY)
Admission: RE | Admit: 2021-02-24 | Discharge: 2021-02-24 | Disposition: A | Discharge status: Home / Self Care | Payer: Medicare Other | Attending: Gastroenterology | Admitting: Gastroenterology

## 2021-02-24 ENCOUNTER — Encounter (HOSPITAL_COMMUNITY)
Admission: RE | Disposition: A | Discharge status: Home / Self Care | Payer: Self-pay | Source: Home / Self Care | Attending: Gastroenterology

## 2021-02-24 DIAGNOSIS — Z01818 Encounter for other preprocedural examination: Secondary | ICD-10-CM | POA: Diagnosis not present

## 2021-02-24 DIAGNOSIS — Z538 Procedure and treatment not carried out for other reasons: Secondary | ICD-10-CM | POA: Diagnosis not present

## 2021-02-24 DIAGNOSIS — K838 Other specified diseases of biliary tract: Secondary | ICD-10-CM

## 2021-02-24 DIAGNOSIS — E1136 Type 2 diabetes mellitus with diabetic cataract: Secondary | ICD-10-CM | POA: Insufficient documentation

## 2021-02-24 LAB — GLUCOSE, CAPILLARY: Glucose-Capillary: 131 mg/dL — ABNORMAL HIGH (ref 70–99)

## 2021-02-24 SURGERY — CANCELLED PROCEDURE
Anesthesia: General

## 2021-02-24 MED ORDER — LACTATED RINGERS IV SOLN
INTRAVENOUS | Status: DC
  Administered 2021-02-24: 1000 mL via INTRAVENOUS

## 2021-02-24 MED ORDER — FENTANYL CITRATE (PF) 100 MCG/2ML IJ SOLN
INTRAMUSCULAR | Status: AC
  Filled 2021-02-24: qty 2

## 2021-02-24 MED ORDER — MIDAZOLAM HCL 2 MG/2ML IJ SOLN
INTRAMUSCULAR | Status: AC
  Filled 2021-02-24: qty 2

## 2021-02-24 MED ORDER — MIDAZOLAM HCL 5 MG/5ML IJ SOLN
INTRAMUSCULAR | Status: AC | PRN
  Administered 2021-02-24: 2 mg via INTRAVENOUS

## 2021-02-24 MED ORDER — INDOMETHACIN 50 MG RE SUPP
RECTAL | Status: AC
  Filled 2021-02-24: qty 2

## 2021-02-24 MED ORDER — PHENYLEPHRINE HCL (PRESSORS) 10 MG/ML IV SOLN
INTRAVENOUS | Status: AC
  Filled 2021-02-24: qty 2

## 2021-02-24 MED ORDER — CIPROFLOXACIN IN D5W 400 MG/200ML IV SOLN
INTRAVENOUS | Status: AC
  Filled 2021-02-24: qty 200

## 2021-02-24 MED ORDER — SODIUM CHLORIDE 0.9 % IV SOLN: INTRAVENOUS | Status: DC

## 2021-02-24 NOTE — H&P (Signed)
GASTROENTEROLOGY PROCEDURE H&P NOTE   Primary Care Physician: [name], FNP  HPI: Kim Hicks is a 55 y.o. female who presents for ERCP for biliary stent exchange/removal with history of prior biliary leak.  Now status post cystic duct ablation with tube removal.  Past Medical History:  Diagnosis Date   Anxiety    Cataract    REMOVED BOTH   Cirrhosis of liver (HCC)    Common migraine with intractable migraine 08/07/2020   Depression    Diabetes mellitus without complication (HCC)    Dysrhythmia    Edema    Family history of adverse reaction to anesthesia    mother had trouble waking up after surgery   Foot fracture, left    Headache    MIGRAINES   Hyperlipidemia    Hypertension    Neuromuscular disorder (HCC)    NEUROPATHY-both hands and feet   On supplemental oxygen by nasal cannula    2L at bedtime only NO CPAP JUST 02 PER PT   Oxygen deficiency    Palpitations    RLS (restless legs syndrome) 02/17/2021   Sleep apnea    Wheezing    Past Surgical History:  Procedure Laterality Date   ABDOMINAL HYSTERECTOMY     one ovary left   BILIARY STENT PLACEMENT N/A 08/07/2019   Procedure: BILIARY STENT PLACEMENT;  Surgeon: [name], MD;  [facility]: [facility];  Service: Endoscopy;  Laterality: N/A;   BILIARY STENT PLACEMENT N/A 11/07/2019   Procedure: BILIARY STENT PLACEMENT;  Surgeon: [name], MD;  [facility];  Service: Endoscopy;  Laterality: N/A;   BILIARY STENT PLACEMENT N/A 05/13/2020   Procedure: BILIARY STENT PLACEMENT;  Surgeon: [name], MD;  [facility];  Service: Endoscopy;  Laterality: N/A;   BILIARY STENT PLACEMENT N/A 05/27/2020   Procedure: BILIARY STENT PLACEMENT;  Surgeon: [name], MD;  [facility];  Service: Endoscopy;  Laterality: N/A;   BILIARY STENT PLACEMENT N/A 06/26/2020   Procedure: BILIARY STENT PLACEMENT;  Surgeon: [name], MD;  [facility];  Service:  Gastroenterology;  Laterality: N/A;   BILIARY STENT PLACEMENT N/A 09/30/2020   Procedure: BILIARY STENT PLACEMENT;  Surgeon: [name], MD;  [facility];  Service: Gastroenterology;  Laterality: N/A;   BIOPSY  06/26/2020   Procedure: BIOPSY;  Surgeon: [name], MD;  Location: [facility];  Service: Gastroenterology;;   CATARACT EXTRACTION W/PHACO Right 01/11/2018   Procedure: CATARACT EXTRACTION PHACO AND INTRAOCULAR LENS PLACEMENT ([place]);  Surgeon: [name], MD;  Location: [facility];  Service: Ophthalmology;  Laterality: Right;  [place] 00:57.5 AP% 15.0 CDE 8.61 Fluid Pack Lot # [id number]   CATARACT EXTRACTION W/PHACO Left 02/15/2018   Procedure: CATARACT EXTRACTION PHACO AND INTRAOCULAR LENS PLACEMENT ([place]);  Surgeon: [name], MD;  Location: [facility];  Service: Ophthalmology;  Laterality: Left;  [place] 00:51 AP% 13.2 CDE 6.82 Fluid p ack lot # [id number]   CHOLECYSTECTOMY N/A 08/04/2019   Procedure: LAPAROSCOPIC CHOLECYSTECTOMY;  Surgeon: [name], MD;  [facility];  Service: General;  Laterality: N/A;   COLONOSCOPY     COLONOSCOPY WITH PROPOFOL N/A 12/02/2020   Procedure: COLONOSCOPY WITH PROPOFOL;  Surgeon: [name], MD;  [facility];  Service: Endoscopy;  Laterality: N/A;   ENDOSCOPIC RETROGRADE CHOLANGIOPANCREATOGRAPHY (ERCP) WITH PROPOFOL N/A 11/07/2019   Procedure: ENDOSCOPIC RETROGRADE CHOLANGIOPANCREATOGRAPHY (ERCP) WITH PROPOFOL;  Surgeon: [name], MD;  [facility];  Service: Endoscopy;  Laterality: N/A;   ENDOSCOPIC RETROGRADE CHOLANGIOPANCREATOGRAPHY (ERCP) WITH PROPOFOL N/A 05/13/2020   Procedure: ENDOSCOPIC RETROGRADE CHOLANGIOPANCREATOGRAPHY (ERCP) WITH PROPOFOL;  Surgeon: [name], MD;  [facility];  Service: Endoscopy;  Laterality: N/A;   ENDOSCOPIC RETROGRADE CHOLANGIOPANCREATOGRAPHY (ERCP) WITH PROPOFOL N/A 06/26/2020   Procedure: ENDOSCOPIC RETROGRADE  CHOLANGIOPANCREATOGRAPHY (ERCP) WITH PROPOFOL;  Surgeon: [name], MD;  [facility];  Service: Gastroenterology;  Laterality: N/A;   ENDOSCOPIC RETROGRADE CHOLANGIOPANCREATOGRAPHY (ERCP) WITH PROPOFOL N/A 09/30/2020   Procedure: ENDOSCOPIC RETROGRADE CHOLANGIOPANCREATOGRAPHY (ERCP) WITH PROPOFOL;  Surgeon: [name], MD;  [facility];  Service: Gastroenterology;  Laterality: N/A;   ERCP N/A 08/07/2019   Procedure: ENDOSCOPIC RETROGRADE CHOLANGIOPANCREATOGRAPHY (ERCP);  Surgeon: [name], MD;  Location: [facility];  Service: Endoscopy;  Laterality: N/A;   ERCP N/A 05/27/2020   Procedure: ENDOSCOPIC RETROGRADE CHOLANGIOPANCREATOGRAPHY (ERCP);  Surgeon: [name], MD;  Location: [facility];  Service: Endoscopy;  Laterality: N/A;   ESOPHAGOGASTRODUODENOSCOPY N/A 06/26/2020   Procedure: ESOPHAGOGASTRODUODENOSCOPY (EGD);  Surgeon: [name]., MD;  Location: [facility];  Service: Gastroenterology;  Laterality: N/A;   ESOPHAGOGASTRODUODENOSCOPY (EGD) WITH PROPOFOL N/A 12/02/2020   Procedure: ESOPHAGOGASTRODUODENOSCOPY (EGD) WITH PROPOFOL;  Surgeon: [name], MD;  [facility];  Service: Endoscopy;  Laterality: N/A;   HOT HEMOSTASIS N/A 12/02/2020   Procedure: HOT HEMOSTASIS (ARGON PLASMA COAGULATION/BICAP);  Surgeon: [name], MD;  Location: [facility];  Service: Endoscopy;  Laterality: N/A;   IR ANGIOGRAM VISCERAL SELECTIVE  12/27/2020   IR CATHETER TUBE CHANGE  12/16/2020   IR CHOLANGIOGRAM EXISTING TUBE  10/16/2020   IR CHOLANGIOGRAM EXISTING TUBE  01/16/2021   IR EMBO VENOUS NOT HEMORR HEMANG  INC GUIDE ROADMAPPING  12/25/2020   IR EXCHANGE BILIARY DRAIN  07/10/2020   IR EXCHANGE BILIARY DRAIN  08/28/2020   IR EXCHANGE BILIARY DRAIN  11/26/2020   IR EXCHANGE BILIARY DRAIN  12/27/2020   IR FLUORO RM 30-60 MIN  01/24/2021   IR RADIOLOGIST EVAL & MGMT  06/20/2020   IR RADIOLOGIST EVAL & MGMT  12/13/2020   IR  RADIOLOGIST EVAL & MGMT  02/18/2021   POLYPECTOMY  12/02/2020   Procedure: POLYPECTOMY;  Surgeon: [name], MD;  [facility];  Service: Endoscopy;;   RCR Bilateral    REMOVAL OF STONES  08/07/2019   Procedure: REMOVAL OF STONES;  Surgeon: [name], MD;  [facility];  Service: Endoscopy;;  balloon sweep, no stones   REMOVAL OF STONES  05/27/2020   Procedure: REMOVAL OF STONES;  Surgeon: [name], MD;  [facility];  Service: Endoscopy;;   REMOVAL OF STONES  06/26/2020   Procedure: REMOVAL OF STONES;  Surgeon: [name]., MD;  [facility]: [facility];  Service: Gastroenterology;;   REMOVAL OF STONES  09/30/2020   Procedure: REMOVAL OF STONES;  Surgeon: [name]., MD;  Location: [facility];  Service: Gastroenterology;;   [name]  08/07/2019   Procedure: [name];  Surgeon: [name], MD;  [facility];  Service: Endoscopy;;   STENT REMOVAL  11/07/2019   Procedure: STENT REMOVAL;  Surgeon: [name], MD;  [facility];  Service: Endoscopy;;   STENT REMOVAL  05/13/2020   Procedure: STENT REMOVAL;  Surgeon: [name], MD;  [facility];  Service: Endoscopy;;   STENT REMOVAL  05/27/2020   Procedure: STENT REMOVAL;  Surgeon: [name], MD;  [facility];  Service: Endoscopy;;   STENT REMOVAL  06/26/2020   Procedure: STENT REMOVAL;  Surgeon: [name]., MD;  Location[facility];  Service: Gastroenterology;;   Lavell Islam REMOVAL  09/30/2020   Procedure: STENT REMOVAL;  Surgeon: [name]., MD;  Location: [facility];  Service: Gastroenterology;;   No current facility-administered medications for this encounter.   No current facility-administered medications for this encounter. Allergies  Allergen Reactions   Dilaudid [Hydromorphone] Hives   Talwin [Pentazocine]     Turned blue around lips and face, rash all over body    Family History  Problem Relation Age of Onset   Diabetes Mother    Heart failure Mother    Depression Mother    Colon cancer Neg Hx    Stomach cancer Neg Hx    Esophageal cancer Neg Hx    Pancreatic cancer Neg Hx    Colon polyps Neg Hx    Rectal cancer Neg Hx    Social History   Socioeconomic History   Marital status: Married    Spouse name: Not on file   Number of children: Not on file   Years of education: Not on file   Highest education level: Not on file  Occupational History   Not on file  Tobacco Use   Smoking status: Never   Smokeless tobacco: Never  Vaping Use   Vaping Use: Never used  Substance and Sexual Activity   Alcohol use: Never   Drug use: Never   Sexual activity: Not Currently  Other Topics Concern   Not on file  Social History Narrative   Lives with niece and husband   Right handed   Drinks 4-5 cups daily   Social Determinants of Health   Financial Resource Strain: Not on file  Food Insecurity: Not on file  Transportation Needs: Not on file  Physical Activity: Not on file  Stress: Not on file  Social Connections: Not on file  Intimate Partner Violence: Not on file    Physical Exam: Today's Vitals   02/24/21 1143  BP: (!) 142/67  Resp: (!) 21  Temp: 98.7 F (37.1 C)  TempSrc: Oral  SpO2: 95%  Weight: 81.6 kg  Height: 5' 5"  (1.651 m)  PainSc: 0-No pain   Body mass index is 29.95 kg/m. GEN: NAD EYE: Sclerae anicteric ENT: MMM CV: Non-tachycardic GI: Soft, NT/ND NEURO:  Alert & Oriented x 3  Lab Results: No results for input(s): WBC, HGB, HCT, PLT in the last 72 hours. BMET No results for input(s): NA, K, CL, CO2, GLUCOSE, BUN, CREATININE, CALCIUM in the last 72 hours. LFT No results for input(s): PROT, ALBUMIN, AST, ALT, ALKPHOS, BILITOT, BILIDIR, IBILI in the last 72 hours. PT/INR No results for input(s): LABPROT, INR in the last 72 hours.   Impression / Plan: This is a 55 y.o.female who presents for ERCP for  biliary stent exchange/removal with history of prior biliary leak.  Now status post cystic duct ablation with tube removal.  The risks of an ERCP were discussed at length, including but not limited to the risk of perforation, bleeding, abdominal pain, post-ERCP pancreatitis (while usually mild can be severe and even life threatening).  The risks and benefits of endoscopic evaluation/treatment were discussed with the patient and/or family; these include but are not limited to the risk of perforation, infection, bleeding, missed lesions, lack of diagnosis, severe illness requiring hospitalization, as well as anesthesia and sedation related illnesses.  The patient's history has been reviewed, patient examined, no change in status, and deemed stable for  procedure.  The patient and/or family is agreeable to proceed.    Justice Britain, MD [facility] Office # [phone]

## 2021-02-24 NOTE — Progress Notes (Signed)
Patient was pended while waiting to see if we could regain power in order to proceed with ERCP. Due to the need for intubation for this procedure, anesthesia felt that he could not move forward with this based on the [facility] long hospital still being on backup generator. After lengthy time of waiting, decision was made that we could not pursue procedure due to anesthesia availability and since the team had not come back from backup generator status.  The patient was offered another date later this month for ERCP.  Patient agreed with this plan of action.  Justice Britain, MD [facility] # [phone]

## 2021-02-24 NOTE — Anesthesia Preprocedure Evaluation (Addendum)
Anesthesia Evaluation  Patient identified by MRN, date of birth, ID band Patient awake    Reviewed: Allergy & Precautions, NPO status , Patient's Chart, lab work & pertinent test results  History of Anesthesia Complications Negative for: history of anesthetic complications  Airway Mallampati: II  TM Distance: >3 FB Neck ROM: Full    Dental no notable dental hx.    Pulmonary sleep apnea (2L o2 AT NIGHT) and Oxygen sleep apnea ,   Supplemental O2    Pulmonary exam normal breath sounds clear to auscultation       Cardiovascular hypertension, Pt. on medications Normal cardiovascular exam Rhythm:Regular Rate:Normal     Neuro/Psych  Headaches, PSYCHIATRIC DISORDERS Anxiety Depression  Neuromuscular disease (neuropathy)    GI/Hepatic GERD  Medicated and Controlled,(+) Cirrhosis       ,   Endo/Other  diabetes, Type 2, Oral Hypoglycemic Agents, Insulin Dependent  Renal/GU negative Renal ROS     Musculoskeletal negative musculoskeletal ROS (+)   Abdominal   Peds  Hematology   Anesthesia Other Findings   Reproductive/Obstetrics                            Anesthesia Physical  Anesthesia Plan  ASA: 3  Anesthesia Plan: General   Post-op Pain Management:    Induction: Intravenous  PONV Risk Score and Plan: 3 and Treatment may vary due to age or medical condition, Ondansetron, Midazolam and Dexamethasone  Airway Management Planned: Oral ETT  Additional Equipment: None  Intra-op Plan:   Post-operative Plan: Extubation in OR  Informed Consent: I have reviewed the patients History and Physical, chart, labs and discussed the procedure including the risks, benefits and alternatives for the proposed anesthesia with the patient or authorized representative who has indicated his/her understanding and acceptance.     Dental advisory given  Plan Discussed with: Anesthesiologist and  CRNA  Anesthesia Plan Comments:        Anesthesia Quick Evaluation

## 2021-02-26 ENCOUNTER — Telehealth: Payer: Self-pay | Admitting: Gastroenterology

## 2021-02-26 NOTE — Telephone Encounter (Signed)
Patient called and needs to cancel and reschedule her ERCP that was for 03/19/21.  Please call to reschedule.  Thank you.

## 2021-03-10 ENCOUNTER — Encounter (HOSPITAL_COMMUNITY): Payer: Self-pay | Admitting: Gastroenterology

## 2021-03-11 ENCOUNTER — Telehealth: Payer: Self-pay | Admitting: *Deleted

## 2021-03-11 NOTE — Telephone Encounter (Signed)
Routed incorrectly  °

## 2021-03-11 NOTE — Progress Notes (Signed)
Attempted to obtain medical history via telephone, unable to reach at this time. I left a voicemail to return pre surgical testing department's phone call.  

## 2021-03-11 NOTE — Progress Notes (Signed)
Spoke with patient about endo procedure on 10/26, she stated she already called office to cancel due to her being out of town. Will removed from schedule.

## 2021-03-11 NOTE — Telephone Encounter (Signed)
Received [facility] from [facility] re:  lyrica needs PA due to dosing more than 4 per day. Called [facility] spoke with Tamika who stated regular lyrica (pregabalin) doesn't need a PA for > 4 caps a day. The pharmacy just needs an over ride. [facility] has to call [facility] to get the over ride.  Ph  [phone]. Called [facility], spoke with Gershon Mussel and advised him of [facility] instructions. He verbalized understanding, appreciation.

## 2021-03-11 NOTE — Telephone Encounter (Signed)
The pt was rescheduled to 04/21/21 at 830 am at [facility] with [facility].  The pt has been re instructed and new instructions mailed to the home

## 2021-03-11 NOTE — Telephone Encounter (Signed)
Dr Rush Landmark the pt wants to reschedule her ERCP to 11/28. She says she is going to the beach and can not make the 10/26 appt.  Please advise if this is ok for her to push this out

## 2021-03-12 NOTE — Telephone Encounter (Signed)
Called pharmacy, spoke with Gershon Mussel who stated the Rx wouldn't go through with over ride. He called insurance ,was told it requires a PA. Called [phone], [facility], spoke with Caminya who stated PA needs to be done through  [employer] [facility], [employer] [facility] ph [phone]. She transferred call, call was cut off. Called back, [facility], Medicare Part D, [employer] spoke with Vicky who started PA.   Pregabalin approved for 5 tabs daily, case # [id number], good 05/25/20 - 05/24/21. [facility] spoke with G[facility] Mussel and advised I have letter of approval. He ran Rx, stated it won't go through. He stated he will continue to run Rx, will call patient when it is filled.

## 2021-03-12 NOTE — Telephone Encounter (Signed)
Pt has called and was made aware of the entry from Leilani Able, Therapist, sports.  Pt states she spoke with Moses at the [place] is not telling pt that she needs to call [facility] back about this, pt states she is out and the pharmacy Is unable to fill due to insurance, please call pt.

## 2021-03-19 ENCOUNTER — Ambulatory Visit (HOSPITAL_COMMUNITY): Admission: RE | Admit: 2021-03-19 | Payer: Medicare Other | Source: Home / Self Care | Admitting: Gastroenterology

## 2021-03-19 ENCOUNTER — Encounter (HOSPITAL_COMMUNITY): Admission: RE | Payer: Self-pay | Source: Home / Self Care

## 2021-03-19 SURGERY — ERCP, WITH INTERVENTION IF INDICATED
Anesthesia: General

## 2021-03-31 ENCOUNTER — Other Ambulatory Visit: Payer: Self-pay | Admitting: Neurology

## 2021-04-04 ENCOUNTER — Ambulatory Visit (INDEPENDENT_AMBULATORY_CARE_PROVIDER_SITE_OTHER): Payer: Medicare Other | Admitting: Sports Medicine

## 2021-04-04 ENCOUNTER — Encounter: Payer: Self-pay | Admitting: Sports Medicine

## 2021-04-04 DIAGNOSIS — S93402A Sprain of unspecified ligament of left ankle, initial encounter: Secondary | ICD-10-CM | POA: Diagnosis not present

## 2021-04-04 DIAGNOSIS — M79672 Pain in left foot: Secondary | ICD-10-CM | POA: Diagnosis not present

## 2021-04-04 DIAGNOSIS — G8929 Other chronic pain: Secondary | ICD-10-CM

## 2021-04-04 DIAGNOSIS — S92062K Displaced intraarticular fracture of left calcaneus, subsequent encounter for fracture with nonunion: Secondary | ICD-10-CM | POA: Diagnosis not present

## 2021-04-04 DIAGNOSIS — M25572 Pain in left ankle and joints of left foot: Secondary | ICD-10-CM

## 2021-04-04 DIAGNOSIS — S96912A Strain of unspecified muscle and tendon at ankle and foot level, left foot, initial encounter: Secondary | ICD-10-CM

## 2021-04-04 MED ORDER — OXYCODONE-ACETAMINOPHEN 10-325 MG PO TABS: 1.0000 | ORAL_TABLET | Freq: Three times a day (TID) | ORAL | 0 refills | Status: AC | PRN

## 2021-04-04 NOTE — Progress Notes (Addendum)
Subjective: Kim Hicks is a 55 y.o. female patient who presents to office for evaluation of Left foot pain. Patient complains of progressive pain especially over the last 2-3 months that started after a fall the 1st week of August. Patient was treated by orthopedics had MRI on 01/22/21, CT 03/24/21, and Xrays on CD that she brings into office on CD from ortho. Patient complains of continued pain all over the foot and ankle and is wearing CAM boot. Patient admits to a history of previous fracture and has a bone stimulator at home but has not used it in a while. Patient denies any other pedal complaints.   Assisted by husband this visit.  Patient is Diabetic was diagnosed in the 90's and last blood sugar was 174 and a1c 9.1  Patient Active Problem List   Diagnosis Date Noted   RLS (restless legs syndrome) 02/17/2021   Benign neoplasm of rectum    AVM (arteriovenous malformation) of colon, acquired    Portal hypertensive gastropathy ([place])    On home oxygen therapy 11/05/2020   Biliary drain displacement 11/01/2020   Iron deficiency anemia 10/03/2020   Common migraine with intractable migraine 17/61/6073   Metabolic encephalopathy    Septic shock ([place]) 07/06/2020   Acute respiratory failure with hypoxemia ([place]) 07/06/2020   Pressure injury of skin 07/04/2020   Post-ERCP acute pancreatitis 06/27/2020   Pancreatitis 06/27/2020   Abscess of gallbladder    Migration of biliary stent    Malnutrition of moderate degree 05/23/2020   Abdominal pain 05/21/2020   Biloma 05/20/2020   Biliary sludge    Complicated UTI (urinary tract infection) 03/11/2020   Encounter for removal of biliary stent    Diabetic peripheral neuropathy ([place]) 09/14/2019   Gait disturbance 09/14/2019   NASH (nonalcoholic steatohepatitis) 08/21/2019   Bile leak    Other cirrhosis of liver ([place]) 08/04/2019   Major depressive disorder, recurrent episode, severe, with psychotic behavior ([place]) 03/25/2011   DYSPHAGIA  UNSPECIFIED 03/04/2009   GERD 02/18/2009   PERSISTENT VOMITING 02/18/2009   CHEST PAIN 02/18/2009   NAUSEA AND VOMITING 02/18/2009   Dehydration 07/19/2007   GASTROENTERITIS 07/19/2007   FIBROIDS, UTERUS 05/02/2007   Depression 05/02/2007   MIGRAINE HEADACHE 05/02/2007   Hypertension 05/02/2007   HEMORRHOIDS 05/02/2007   Chronic constipation 05/02/2007   IBS 05/02/2007   PSORIASIS 05/02/2007   PITUITARY NEOPLASM, HX OF 05/02/2007   LIVER FUNCTION TESTS, ABNORMAL, HX OF 05/02/2007   Diabetes mellitus without complication ([place]) 71/10/2692   Hyperlipidemia associated with type 2 diabetes mellitus ([place]) 02/04/2007   SINUSITIS, ACUTE 02/04/2007   Abdominal pain, right upper quadrant 02/04/2007   Hyperlipidemia 02/04/2007    Current Outpatient Medications on File Prior to Visit  Medication Sig Dispense Refill   acetaminophen (TYLENOL) 500 MG tablet Take 1,000 mg by mouth every 6 (six) hours as needed for moderate pain.     ALPRAZolam (XANAX) 0.5 MG tablet Take 0.5 mg by mouth 3 (three) times daily.     betamethasone valerate (VALISONE) 0.1 % cream Apply 1 application topically 3 (three) times daily.     brexpiprazole (REXULTI) 2 MG TABS tablet Take 2 mg by mouth at bedtime.     Docusate Calcium (STOOL SOFTENER PO) Take 2 tablets by mouth daily.     Dulaglutide 4.5 MG/0.5ML SOPN Inject 4.5 mg into the skin every Monday.     DULoxetine (CYMBALTA) 30 MG capsule Take 30 mg by mouth daily.     Empagliflozin-metFORMIN HCl ER (SYNJARDY XR)  25-1000 MG TB24 Take 1 tablet by mouth 2 (two) times daily.     ezetimibe (ZETIA) 10 MG tablet Take 10 mg by mouth daily.     Insulin Glargine (BASAGLAR KWIKPEN) 100 UNIT/ML Inject 40 Units into the skin at bedtime.     iron polysaccharides (NIFEREX) 150 MG capsule Take 1 capsule (150 mg total) by mouth daily. (Patient not taking: No sig reported) 30 capsule 1   lactulose (CHRONULAC) 10 GM/15ML solution Take 15 mLs (10 g total) by mouth 3 (three) times  daily. 900 mL 1   LINZESS 290 MCG CAPS capsule TAKE 1 CAPSULE BY MOUTH DAILY BEFORE BREAKFAST. 90 capsule 1   lisinopril (ZESTRIL) 40 MG tablet Take 40 mg by mouth every morning.     meloxicam (MOBIC) 15 MG tablet Take 15 mg by mouth daily.     Na Sulfate-K Sulfate-Mg Sulf 17.5-3.13-1.6 GM/177ML SOLN Take per colonoscopy instructions sheet 354 mL 0   Omega-3 1000 MG CAPS Take 1,000 mg by mouth daily.     ondansetron (ZOFRAN ODT) 4 MG disintegrating tablet Take 1 tablet (4 mg total) by mouth every 8 (eight) hours as needed for nausea or vomiting. 20 tablet 0   pantoprazole (PROTONIX) 20 MG tablet Take 20 mg by mouth daily.     polyethylene glycol (GOLYTELY) 236 g solution Drink one glass of the solution every 15 minutes until gone 4000 mL 0   pramipexole (MIRAPEX) 0.25 MG tablet Take 1 tablet (0.25 mg total) by mouth at bedtime. 30 tablet 2   prazosin (MINIPRESS) 2 MG capsule Take 4 mg by mouth at bedtime.     pregabalin (LYRICA) 100 MG capsule Two capsules in the morning and evening, one at midday (Patient taking differently: Take by mouth See admin instructions. Take 100 mg in the morning and midday and 200 mg at bedtime) 450 capsule 1   QUEtiapine (SEROQUEL) 400 MG tablet Take 2 tablets (800 mg total) by mouth at bedtime.     QUEtiapine (SEROQUEL) 50 MG tablet Take 80 mg by mouth every morning.     simvastatin (ZOCOR) 10 MG tablet Take 10 mg by mouth daily.  6   terbinafine (LAMISIL) 250 MG tablet Take 250 mg by mouth daily.     traMADol ([name]) 50 MG tablet Take 50 mg by mouth 3 (three) times daily.     vortioxetine HBr ([name]) 20 MG TABS tablet Take 20 mg by mouth at bedtime.     ziprasidone ([name]) 20 MG capsule Take 20 mg by mouth at bedtime.     Current Facility-Administered Medications on File Prior to Visit  Medication Dose Route Frequency Provider Last Rate Last Admin   midazolam (VERSED) 5 MG/5ML injection   Intravenous Anesthesia Intra-op Cleda Daub, CRNA   2 mg at  02/24/21 1258    Allergies  Allergen Reactions   Dilaudid [Hydromorphone] Hives   Talwin [Pentazocine]     Turned blue around lips and face, rash all over body    Objective:  General: Alert and oriented x3 in no acute distress  Dermatology: No open lesions bilateral lower extremities, no webspace macerations, no ecchymosis bilateral, all nails x 10 are well manicured.  Vascular: Minimal trace edema to the left foot, Dorsalis Pedis and Posterior Tibial pedal pulses 1/4, Capillary Fill Time 5 seconds,(+) pedal hair growth bilateral, Temperature gradient within normal limits.  Neurology: Gross sensation intact via light touch bilateral, Subjective sharp shooting pains to left  foot and ankle.   Musculoskeletal: There  is tenderness with palpation diffusely over the entire left foot and ankle with most pain dorsolateral. Guarding due to pain on left.   Gait: CAM boot, Antalgic gait    MRI 01/22/21 IMPRESSION:  1. Acute nondisplaced fracture involving the anterior calcaneus  within the subchondral bone adjacent to the calcaneocuboid joint.  Additional nondisplaced fracture through the anterior process of the  calcaneus.  2. Prominent bone marrow edema within the proximal cuboid without  discrete fracture.  3. Complete tear of the anterior talofibular ligament.  4. Bifurcate ligament is thickened and heterogeneous, likely  partially torn.  5. Mild peroneal and distal posterior tibialis tenosynovitis.   CT 03/24/21  IMPRESSION:  1. Largely nondisplaced comminuted intra-articular fracture of the  anterior calcaneal process with some resorption along the fracture  margins but no significant bridging bone.  2. Healing fracture at the plantar base of the proximal cuboid.  3. 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 -Advised patient that her fracture is non-surgical since it is nondisplaced -Continue with CAM walker to patient to wear at all times and instructed on use -Recommend to start Bone stimulator; Exogen to assist with final fracture healing since there is no significant bridging on CT noted -Recommend protection, rest, elevation daily until symptoms improve -Rx Percocet for intense pain -Patient to return to office in 4 weeks for serial x-rays to assess healing  or sooner if condition worsens.  Landis Martins, DPM

## 2021-04-09 ENCOUNTER — Telehealth: Payer: Self-pay | Admitting: Sports Medicine

## 2021-04-09 NOTE — Telephone Encounter (Signed)
Dr Cannon Kettle pt  Pt fell yesterday and has excessive swelling in foot as well as bruising, unable to bend toes due to swelling.  She had taken CAM boot off to get in shower and dog tripped her.  Pt is taking percocet as prescribed by Dr. Cannon Kettle and being tx for fx.  Pls advise.

## 2021-04-14 ENCOUNTER — Telehealth: Payer: Self-pay

## 2021-04-14 ENCOUNTER — Other Ambulatory Visit: Payer: Self-pay | Admitting: Sports Medicine

## 2021-04-14 MED ORDER — OXYCODONE-ACETAMINOPHEN 10-325 MG PO TABS: 1.0000 | ORAL_TABLET | Freq: Three times a day (TID) | ORAL | 0 refills | Status: AC | PRN

## 2021-04-14 NOTE — Progress Notes (Signed)
Refilled pain meds

## 2021-04-14 NOTE — Telephone Encounter (Signed)
Pt called and LVM at nurse stating requesting a RF on percocet/oxycodone 10-362m

## 2021-04-15 ENCOUNTER — Telehealth: Payer: Self-pay | Admitting: Sports Medicine

## 2021-04-15 NOTE — Telephone Encounter (Signed)
Pt req refill Percocet [facility]

## 2021-04-15 NOTE — Telephone Encounter (Signed)
Pt notified/reb °

## 2021-04-15 NOTE — Telephone Encounter (Signed)
Pt notified of RF sent to pharmacy

## 2021-04-20 ENCOUNTER — Encounter (HOSPITAL_COMMUNITY): Payer: Self-pay | Admitting: Anesthesiology

## 2021-04-20 NOTE — Anesthesia Preprocedure Evaluation (Deleted)
Anesthesia Evaluation    Reviewed: Allergy & Precautions, H&P , Patient's Chart, lab work & pertinent test results  History of Anesthesia Complications (+) Family history of anesthesia reaction  Airway        Dental   Pulmonary neg pulmonary ROS, sleep apnea and Oxygen sleep apnea ,           Cardiovascular Exercise Tolerance: Good hypertension, negative cardio ROS       Neuro/Psych  Headaches, Anxiety Depression negative neurological ROS  negative psych ROS   GI/Hepatic negative GI ROS, Neg liver ROS, GERD  Medicated,(+) Cirrhosis       ,   Endo/Other  negative endocrine ROSdiabetes  Renal/GU negative Renal ROS  negative genitourinary   Musculoskeletal   Abdominal   Peds  Hematology negative hematology ROS (+) Blood dyscrasia, anemia ,   Anesthesia Other Findings   Reproductive/Obstetrics negative OB ROS                             Anesthesia Physical Anesthesia Plan  ASA: 3  Anesthesia Plan: General   Post-op Pain Management: Minimal or no pain anticipated   Induction: Intravenous  PONV Risk Score and Plan: 3 and Ondansetron, Dexamethasone and Midazolam  Airway Management Planned: Oral ETT  Additional Equipment:   Intra-op Plan:   Post-operative Plan: Extubation in OR  Informed Consent: I have reviewed the patients History and Physical, chart, labs and discussed the procedure including the risks, benefits and alternatives for the proposed anesthesia with the patient or authorized representative who has indicated his/her understanding and acceptance.       Plan Discussed with:   Anesthesia Plan Comments:         Anesthesia Quick Evaluation

## 2021-04-21 ENCOUNTER — Telehealth: Payer: Self-pay

## 2021-04-21 ENCOUNTER — Ambulatory Visit (HOSPITAL_COMMUNITY): Admission: RE | Admit: 2021-04-21 | Payer: Medicare Other | Source: Home / Self Care | Admitting: Gastroenterology

## 2021-04-21 SURGERY — ERCP, WITH INTERVENTION IF INDICATED
Anesthesia: General

## 2021-04-21 NOTE — Progress Notes (Signed)
Pt called this morning at 0530 and stated that she had gone to urgent care yesterday and has pneumonia. She called saying she was cancelling her procedure for today due to the pneumonia. She stated she would call to reschedule.

## 2021-04-21 NOTE — Telephone Encounter (Signed)
Mansouraty, Telford Nab., MD  Paulo Fruit, RN; Timothy Lasso, RN Thank you for update.  Ramelo Oetken, please schedule this patient next available ERCP 90-minute slot.  Thanks.  GM

## 2021-04-22 ENCOUNTER — Other Ambulatory Visit: Payer: Self-pay

## 2021-04-22 DIAGNOSIS — K838 Other specified diseases of biliary tract: Secondary | ICD-10-CM

## 2021-04-22 NOTE — Telephone Encounter (Signed)
ERCP scheduled for 06/24/21 at 730 am at [facility] with GM  ERCP scheduled, pt instructed and medications reviewed.  Patient instructions mailed to home.  Patient to call with any questions or concerns.

## 2021-05-02 ENCOUNTER — Encounter: Payer: Self-pay | Admitting: Sports Medicine

## 2021-05-02 ENCOUNTER — Ambulatory Visit (INDEPENDENT_AMBULATORY_CARE_PROVIDER_SITE_OTHER): Payer: Medicare Other

## 2021-05-02 ENCOUNTER — Ambulatory Visit (INDEPENDENT_AMBULATORY_CARE_PROVIDER_SITE_OTHER): Payer: Medicare Other | Admitting: Sports Medicine

## 2021-05-02 DIAGNOSIS — M25572 Pain in left ankle and joints of left foot: Secondary | ICD-10-CM

## 2021-05-02 DIAGNOSIS — S92062K Displaced intraarticular fracture of left calcaneus, subsequent encounter for fracture with nonunion: Secondary | ICD-10-CM

## 2021-05-02 DIAGNOSIS — S96912A Strain of unspecified muscle and tendon at ankle and foot level, left foot, initial encounter: Secondary | ICD-10-CM

## 2021-05-02 DIAGNOSIS — S93402A Sprain of unspecified ligament of left ankle, initial encounter: Secondary | ICD-10-CM | POA: Diagnosis not present

## 2021-05-02 DIAGNOSIS — G8929 Other chronic pain: Secondary | ICD-10-CM

## 2021-05-02 DIAGNOSIS — M79672 Pain in left foot: Secondary | ICD-10-CM

## 2021-05-02 MED ORDER — CYCLOBENZAPRINE HCL 10 MG PO TABS: 10.0000 mg | ORAL_TABLET | Freq: Three times a day (TID) | ORAL | 0 refills | Status: DC | PRN

## 2021-05-02 MED ORDER — OXYCODONE-ACETAMINOPHEN 10-325 MG PO TABS: 1.0000 | ORAL_TABLET | Freq: Three times a day (TID) | ORAL | 0 refills | Status: AC | PRN

## 2021-05-02 NOTE — Progress Notes (Signed)
Subjective: Kim Hicks is a 55 y.o. female patient who returns to office for follow evaluation of Left foot pain. Patient reports that she still has pain in the boot is heavy pain is 9 out of 10 and cannot tell if the boot is even helping that her foot turns out even when she has the boot on.  Patient has a history of being treated by orthopedics had MRI on 01/22/21, CT 03/24/21, and Xrays on CD and x-rays performed today in office.  Patient denies any other pedal complaints.   Assisted by daughter this visit was waiting in the car.   Fasting blood sugar not recorded. Last A1c 9.1  Patient Active Problem List   Diagnosis Date Noted   RLS (restless legs syndrome) 02/17/2021   Anemia 12/03/2020   Benign neoplasm of rectum    AVM (arteriovenous malformation) of colon, acquired    Portal hypertensive gastropathy ([place])    On home oxygen therapy 11/05/2020   Biliary drain displacement 11/01/2020   Iron deficiency anemia 10/03/2020   Common migraine with intractable migraine 02/77/4128   Metabolic encephalopathy    Septic shock ([place]) 07/06/2020   Acute respiratory failure with hypoxemia ([place]) 07/06/2020   Pressure injury of skin 07/04/2020   Post-ERCP acute pancreatitis 06/27/2020   Pancreatitis 06/27/2020   Abscess of gallbladder    Migration of biliary stent    Malnutrition of moderate degree 05/23/2020   Abdominal pain 05/21/2020   Biloma 05/20/2020   Biliary sludge    Complicated UTI (urinary tract infection) 03/11/2020   Encounter for removal of biliary stent    Diabetic peripheral neuropathy ([place]) 09/14/2019   Gait disturbance 09/14/2019   NASH (nonalcoholic steatohepatitis) 08/21/2019   Bile leak    Other cirrhosis of liver ([place]) 08/04/2019   Major depressive disorder, recurrent episode, severe, with psychotic behavior ([place]) 03/25/2011   DYSPHAGIA UNSPECIFIED 03/04/2009   GERD 02/18/2009   PERSISTENT VOMITING 02/18/2009   CHEST PAIN 02/18/2009   NAUSEA AND VOMITING  02/18/2009   Dehydration 07/19/2007   GASTROENTERITIS 07/19/2007   FIBROIDS, UTERUS 05/02/2007   Depression 05/02/2007   MIGRAINE HEADACHE 05/02/2007   Hypertension 05/02/2007   HEMORRHOIDS 05/02/2007   Chronic constipation 05/02/2007   IBS 05/02/2007   PSORIASIS 05/02/2007   PITUITARY NEOPLASM, HX OF 05/02/2007   LIVER FUNCTION TESTS, ABNORMAL, HX OF 05/02/2007   Diabetes mellitus without complication ([place]) 78/67/6720   Hyperlipidemia associated with type 2 diabetes mellitus ([place]) 02/04/2007   SINUSITIS, ACUTE 02/04/2007   Abdominal pain, right upper quadrant 02/04/2007   Hyperlipidemia 02/04/2007    Current Outpatient Medications on File Prior to Visit  Medication Sig Dispense Refill   ALPRAZolam (XANAX) 0.5 MG tablet Take 0.5 mg by mouth 3 (three) times daily.     amoxicillin-clavulanate (AUGMENTIN) 875-125 MG tablet Take 1 tablet by mouth 2 (two) times daily.     brexpiprazole (REXULTI) 2 MG TABS tablet Take 2 mg by mouth at bedtime.     Calcium Carbonate-Vit D-Min (CALCIUM 1200) 1200-1000 MG-UNIT CHEW Chew 1 tablet by mouth daily.     diltiazem (DILACOR XR) 120 MG 24 hr capsule Take 120 mg by mouth at bedtime.     docusate sodium (COLACE) 50 MG capsule Take 50 mg by mouth 2 (two) times daily.     Dulaglutide 4.5 MG/0.5ML SOPN Inject 4.5 mg into the skin every Monday.     DULoxetine (CYMBALTA) 30 MG capsule Take 30 mg by mouth daily.     Empagliflozin-metFORMIN HCl ER (  SYNJARDY XR) 25-1000 MG TB24 Take 1 tablet by mouth daily.     ezetimibe (ZETIA) 10 MG tablet Take 10 mg by mouth daily.     Insulin Glargine (BASAGLAR KWIKPEN) 100 UNIT/ML Inject 40 Units into the skin at bedtime.     iron polysaccharides (NIFEREX) 150 MG capsule Take 1 capsule (150 mg total) by mouth daily. (Patient not taking: Reported on 02/18/2021) 30 capsule 1   lactulose (CHRONULAC) 10 GM/15ML solution Take 15 mLs (10 g total) by mouth 3 (three) times daily. 900 mL 1   LINZESS 290 MCG CAPS capsule TAKE 1  CAPSULE BY MOUTH DAILY BEFORE BREAKFAST. 90 capsule 1   lisinopril (ZESTRIL) 40 MG tablet Take 40 mg by mouth every morning.     metFORMIN (GLUCOPHAGE) 1000 MG tablet Take 1,000 mg by mouth at bedtime.     Na Sulfate-K Sulfate-Mg Sulf 17.5-3.13-1.6 GM/177ML SOLN Take per colonoscopy instructions sheet (Patient not taking: Reported on 04/10/2021) 354 mL 0   Omega-3 1000 MG CAPS Take 1,000 mg by mouth daily.     ondansetron (ZOFRAN ODT) 4 MG disintegrating tablet Take 1 tablet (4 mg total) by mouth every 8 (eight) hours as needed for nausea or vomiting. (Patient not taking: Reported on 04/10/2021) 20 tablet 0   pantoprazole (PROTONIX) 20 MG tablet Take 20 mg by mouth daily.     polyethylene glycol (GOLYTELY) 236 g solution Drink one glass of the solution every 15 minutes until gone (Patient not taking: Reported on 04/10/2021) 4000 mL 0   pramipexole (MIRAPEX) 0.25 MG tablet Take 1 tablet (0.25 mg total) by mouth at bedtime. 30 tablet 2   prazosin (MINIPRESS) 2 MG capsule Take 4 mg by mouth at bedtime.     predniSONE (STERAPRED UNI-PAK 21 TAB) 5 MG (21) TBPK tablet Take 5 mg by mouth as directed.     pregabalin (LYRICA) 100 MG capsule Two capsules in the morning and evening, one at midday (Patient taking differently: Take 100-200 mg by mouth See admin instructions. Take 200 mg in the morning, 100 mg at lunch, and 200 mg at bedtime) 450 capsule 1   QUEtiapine (SEROQUEL) 400 MG tablet Take 2 tablets (800 mg total) by mouth at bedtime.     QUEtiapine (SEROQUEL) 50 MG tablet Take 100 mg by mouth every morning.     simvastatin (ZOCOR) 10 MG tablet Take 10 mg by mouth daily.  6   terbinafine (LAMISIL) 250 MG tablet Take 250 mg by mouth daily.     traMADol ([name]) 50 MG tablet Take 50 mg by mouth 2 (two) times daily.     vortioxetine HBr ([name]) 20 MG TABS tablet Take 20 mg by mouth at bedtime.     ziprasidone ([name]) 20 MG capsule Take 20 mg by mouth at bedtime.     Current Facility-Administered  Medications on File Prior to Visit  Medication Dose Route Frequency Provider Last Rate Last Admin   midazolam (VERSED) 5 MG/5ML injection   Intravenous Anesthesia Intra-op Cleda Daub, CRNA   2 mg at 02/24/21 1258    Allergies  Allergen Reactions   Dilaudid [Hydromorphone] Hives   Talwin [Pentazocine]     Turned blue around lips and face, rash all over body    Objective:  General: Alert and oriented x3 in no acute distress  Dermatology: No open lesions bilateral lower extremities, no webspace macerations, no ecchymosis bilateral, all nails x 10 are well manicured.  Vascular: Minimal trace edema to the left foot, Dorsalis Pedis  and Posterior Tibial pedal pulses 1/4, Capillary Fill Time 5 seconds,(+) pedal hair growth bilateral, Temperature gradient within normal limits.  Neurology: Gross sensation intact via light touch bilateral, Subjective sharp shooting pains to left  foot and ankle.   Musculoskeletal: There is tenderness with palpation diffusely over the entire left foot and ankle with most pain dorsolateral. Guarding due to pain on left.   Gait: CAM boot, Antalgic gait  X-rays reveal a nondisplaced fracture at the anterior process of the calcaneus with fracture gap less than 2 mm, there is mild soft tissue swelling.  No other acute osseous findings noted.  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 -Advised patient since weightbearing with her cam boot is causing a lot of pain I recommend to convert her to a cast and nonweightbearing to see if this will help; apply short leg fiberglass cast to the left lower extremity being sure to pad all bony prominences.  Educated patient to keep cast clean dry and intact and to avoid sticking anything in the cast -Patient has a knee scooter at home that will help her be nonweightbearing to the left lower extremity -Recommend to start Bone stimulator since patient is still having significant pain with fracture advised patient that I will follow-up with the stimulator rep to see if her stimulator device has been approved once approved if within this next week we may have her to return to office so that way we can window out her cast to start new treatment -Recommend protection, rest, elevation daily until symptoms improve -Rx Flexeril for muscle pain and Percocet for intense foot and ankle pain -Patient to return to office in 2 weeks for cast change or sooner if problems arise.  Landis Martins, DPM

## 2021-05-16 ENCOUNTER — Ambulatory Visit (INDEPENDENT_AMBULATORY_CARE_PROVIDER_SITE_OTHER): Payer: Medicare Other

## 2021-05-16 ENCOUNTER — Encounter: Payer: Self-pay | Admitting: Sports Medicine

## 2021-05-16 ENCOUNTER — Ambulatory Visit (INDEPENDENT_AMBULATORY_CARE_PROVIDER_SITE_OTHER): Payer: Medicare Other | Admitting: Sports Medicine

## 2021-05-16 DIAGNOSIS — S92062K Displaced intraarticular fracture of left calcaneus, subsequent encounter for fracture with nonunion: Secondary | ICD-10-CM | POA: Diagnosis not present

## 2021-05-16 DIAGNOSIS — G8929 Other chronic pain: Secondary | ICD-10-CM

## 2021-05-16 DIAGNOSIS — M79672 Pain in left foot: Secondary | ICD-10-CM

## 2021-05-16 DIAGNOSIS — M25572 Pain in left ankle and joints of left foot: Secondary | ICD-10-CM

## 2021-05-16 MED ORDER — OXYCODONE-ACETAMINOPHEN 10-325 MG PO TABS: 1.0000 | ORAL_TABLET | Freq: Three times a day (TID) | ORAL | 0 refills | Status: AC | PRN

## 2021-05-16 MED ORDER — CYCLOBENZAPRINE HCL 10 MG PO TABS
10.0000 mg | ORAL_TABLET | Freq: Three times a day (TID) | ORAL | 0 refills | Status: DC | PRN
Start: 2021-05-16 — End: 2021-05-30

## 2021-05-16 NOTE — Progress Notes (Signed)
Subjective: Kim Hicks is a 55 y.o. female patient who returns to office for follow evaluation of Left foot pain secondary to fracture injury.  Patient reports that she is still hurting on her foot states that the oxycodone helps to ease it off for a few hours but the pain is right back feels like the pain could be getting worse.  Fasting blood sugar not recorded. Last A1c 9.1  Patient Active Problem List   Diagnosis Date Noted   RLS (restless legs syndrome) 02/17/2021   Anemia 12/03/2020   Benign neoplasm of rectum    AVM (arteriovenous malformation) of colon, acquired    Portal hypertensive gastropathy ([place])    On home oxygen therapy 11/05/2020   Biliary drain displacement 11/01/2020   Iron deficiency anemia 10/03/2020   Common migraine with intractable migraine 82/42/3536   Metabolic encephalopathy    Septic shock ([place]) 07/06/2020   Acute respiratory failure with hypoxemia ([place]) 07/06/2020   Pressure injury of skin 07/04/2020   Post-ERCP acute pancreatitis 06/27/2020   Pancreatitis 06/27/2020   Abscess of gallbladder    Migration of biliary stent    Malnutrition of moderate degree 05/23/2020   Abdominal pain 05/21/2020   Biloma 05/20/2020   Biliary sludge    Complicated UTI (urinary tract infection) 03/11/2020   Encounter for removal of biliary stent    Diabetic peripheral neuropathy ([place]) 09/14/2019   Gait disturbance 09/14/2019   NASH (nonalcoholic steatohepatitis) 08/21/2019   Bile leak    Other cirrhosis of liver ([place]) 08/04/2019   Major depressive disorder, recurrent episode, severe, with psychotic behavior ([place]) 03/25/2011   DYSPHAGIA UNSPECIFIED 03/04/2009   GERD 02/18/2009   PERSISTENT VOMITING 02/18/2009   CHEST PAIN 02/18/2009   NAUSEA AND VOMITING 02/18/2009   Dehydration 07/19/2007   GASTROENTERITIS 07/19/2007   FIBROIDS, UTERUS 05/02/2007   Depression 05/02/2007   MIGRAINE HEADACHE 05/02/2007   Hypertension 05/02/2007   HEMORRHOIDS 05/02/2007    Chronic constipation 05/02/2007   IBS 05/02/2007   PSORIASIS 05/02/2007   PITUITARY NEOPLASM, HX OF 05/02/2007   LIVER FUNCTION TESTS, ABNORMAL, HX OF 05/02/2007   Diabetes mellitus without complication ([place]) 14/43/1540   Hyperlipidemia associated with type 2 diabetes mellitus ([place]) 02/04/2007   SINUSITIS, ACUTE 02/04/2007   Abdominal pain, right upper quadrant 02/04/2007   Hyperlipidemia 02/04/2007    Current Outpatient Medications on File Prior to Visit  Medication Sig Dispense Refill   ALPRAZolam (XANAX) 0.5 MG tablet Take 0.5 mg by mouth 3 (three) times daily.     amoxicillin-clavulanate (AUGMENTIN) 875-125 MG tablet Take 1 tablet by mouth 2 (two) times daily.     brexpiprazole (REXULTI) 2 MG TABS tablet Take 2 mg by mouth at bedtime.     Calcium Carbonate-Vit D-Min (CALCIUM 1200) 1200-1000 MG-UNIT CHEW Chew 1 tablet by mouth daily.     cyclobenzaprine (FLEXERIL) 10 MG tablet Take 1 tablet (10 mg total) by mouth 3 (three) times daily as needed for muscle spasms. 30 tablet 0   diltiazem (DILACOR XR) 120 MG 24 hr capsule Take 120 mg by mouth at bedtime.     docusate sodium (COLACE) 50 MG capsule Take 50 mg by mouth 2 (two) times daily.     Dulaglutide 4.5 MG/0.5ML SOPN Inject 4.5 mg into the skin every Monday.     DULoxetine (CYMBALTA) 30 MG capsule Take 30 mg by mouth daily.     Empagliflozin-metFORMIN HCl ER (SYNJARDY XR) 25-1000 MG TB24 Take 1 tablet by mouth daily.     ezetimibe (  ZETIA) 10 MG tablet Take 10 mg by mouth daily.     Insulin Glargine (BASAGLAR KWIKPEN) 100 UNIT/ML Inject 40 Units into the skin at bedtime.     iron polysaccharides (NIFEREX) 150 MG capsule Take 1 capsule (150 mg total) by mouth daily. (Patient not taking: Reported on 02/18/2021) 30 capsule 1   lactulose (CHRONULAC) 10 GM/15ML solution Take 15 mLs (10 g total) by mouth 3 (three) times daily. 900 mL 1   LINZESS 290 MCG CAPS capsule TAKE 1 CAPSULE BY MOUTH DAILY BEFORE BREAKFAST. 90 capsule 1   lisinopril  (ZESTRIL) 40 MG tablet Take 40 mg by mouth every morning.     metFORMIN (GLUCOPHAGE) 1000 MG tablet Take 1,000 mg by mouth at bedtime.     Na Sulfate-K Sulfate-Mg Sulf 17.5-3.13-1.6 GM/177ML SOLN Take per colonoscopy instructions sheet (Patient not taking: Reported on 04/10/2021) 354 mL 0   Omega-3 1000 MG CAPS Take 1,000 mg by mouth daily.     ondansetron (ZOFRAN ODT) 4 MG disintegrating tablet Take 1 tablet (4 mg total) by mouth every 8 (eight) hours as needed for nausea or vomiting. (Patient not taking: Reported on 04/10/2021) 20 tablet 0   pantoprazole (PROTONIX) 20 MG tablet Take 20 mg by mouth daily.     polyethylene glycol (GOLYTELY) 236 g solution Drink one glass of the solution every 15 minutes until gone (Patient not taking: Reported on 04/10/2021) 4000 mL 0   pramipexole (MIRAPEX) 0.25 MG tablet Take 1 tablet (0.25 mg total) by mouth at bedtime. 30 tablet 2   prazosin (MINIPRESS) 2 MG capsule Take 4 mg by mouth at bedtime.     predniSONE (STERAPRED UNI-PAK 21 TAB) 5 MG (21) TBPK tablet Take 5 mg by mouth as directed.     pregabalin (LYRICA) 100 MG capsule Two capsules in the morning and evening, one at midday (Patient taking differently: Take 100-200 mg by mouth See admin instructions. Take 200 mg in the morning, 100 mg at lunch, and 200 mg at bedtime) 450 capsule 1   QUEtiapine (SEROQUEL) 400 MG tablet Take 2 tablets (800 mg total) by mouth at bedtime.     QUEtiapine (SEROQUEL) 50 MG tablet Take 100 mg by mouth every morning.     simvastatin (ZOCOR) 10 MG tablet Take 10 mg by mouth daily.  6   terbinafine (LAMISIL) 250 MG tablet Take 250 mg by mouth daily.     traMADol ([name]) 50 MG tablet Take 50 mg by mouth 2 (two) times daily.     vortioxetine HBr ([name]) 20 MG TABS tablet Take 20 mg by mouth at bedtime.     ziprasidone ([name]) 20 MG capsule Take 20 mg by mouth at bedtime.     Current Facility-Administered Medications on File Prior to Visit  Medication Dose Route Frequency  Provider Last Rate Last Admin   midazolam (VERSED) 5 MG/5ML injection   Intravenous Anesthesia Intra-op Cleda Daub, CRNA   2 mg at 02/24/21 1258    Allergies  Allergen Reactions   Dilaudid [Hydromorphone] Hives   Talwin [Pentazocine]     Turned blue around lips and face, rash all over body    Objective:  General: Alert and oriented x3 in no acute distress  Dermatology: No open lesions bilateral lower extremities, no webspace macerations, no ecchymosis bilateral, all nails x 10 are well manicured.  Vascular: Minimal trace edema to the left foot, Dorsalis Pedis and Posterior Tibial pedal pulses 1/4, Capillary Fill Time 5 seconds,(+) pedal hair growth bilateral, Temperature  gradient within normal limits.  Neurology: Gross sensation intact via light touch bilateral, Subjective sharp shooting pains to left  foot and ankle.   Musculoskeletal: There is tenderness with palpation diffusely over the entire left foot and ankle with most pain dorsolateral at the sinus tarsi and anterior calcaneus. Guarding due to pain on left.   Gait: CAM boot, Antalgic gait  X-rays reveal a nondisplaced fracture at the anterior process of the calcaneus with fracture gap less than 2 mm, there is mild soft tissue swelling with exogen bone stimulator port in the proper position.  No other acute osseous findings noted.  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 -Cast removed -Re-Applied below the knee fiberglass cast with window for exogen bone stimulator treatment -Refilled Percocet and Flexeril -Recommend protection, rest, ice elevation  daily until symptoms improve -Return to office in 2 weeks for cast change or sooner if problems or issues arise  Landis Martins, DPM

## 2021-05-22 ENCOUNTER — Telehealth: Payer: Self-pay | Admitting: Podiatry

## 2021-05-22 ENCOUNTER — Telehealth: Payer: Self-pay | Admitting: *Deleted

## 2021-05-22 MED ORDER — OXYCODONE-ACETAMINOPHEN 10-325 MG PO TABS: 1.0000 | ORAL_TABLET | Freq: Three times a day (TID) | ORAL | 0 refills | Status: AC | PRN

## 2021-05-22 NOTE — Telephone Encounter (Signed)
Patient called and left a message stating that she got the bone stimulator and wanted to know how many times a day to do it and for how long and I called the patient back and relayed per Dr March Rummage to do 2 times a day for 20 minutes and patient also stated that she was out of pain medicine and needed a refill and I stated that Dr Cannon Kettle was out of the office and I would get the on call doctor to call a prescription into [facility] [place]. Lattie Haw

## 2021-05-22 NOTE — Telephone Encounter (Signed)
-----   Message from Viviana Simpler, [facility] sent at 05/22/2021 12:21 PM EST ----- Patient has a fracture and Dr Cannon Kettle put patient in a cast and the bone stimulator arrived at the patient's home and patient stated that she needed a refill of pain medicine to [facility] [place]. Lattie Haw

## 2021-05-30 ENCOUNTER — Encounter: Payer: Self-pay | Admitting: Sports Medicine

## 2021-05-30 ENCOUNTER — Ambulatory Visit (INDEPENDENT_AMBULATORY_CARE_PROVIDER_SITE_OTHER): Payer: Medicare Other | Admitting: Sports Medicine

## 2021-05-30 DIAGNOSIS — S92062K Displaced intraarticular fracture of left calcaneus, subsequent encounter for fracture with nonunion: Secondary | ICD-10-CM | POA: Diagnosis not present

## 2021-05-30 DIAGNOSIS — M25572 Pain in left ankle and joints of left foot: Secondary | ICD-10-CM

## 2021-05-30 DIAGNOSIS — R110 Nausea: Secondary | ICD-10-CM

## 2021-05-30 DIAGNOSIS — G8929 Other chronic pain: Secondary | ICD-10-CM

## 2021-05-30 DIAGNOSIS — M79672 Pain in left foot: Secondary | ICD-10-CM

## 2021-05-30 MED ORDER — OXYCODONE-ACETAMINOPHEN 10-325 MG PO TABS: 1.0000 | ORAL_TABLET | Freq: Three times a day (TID) | ORAL | 0 refills | Status: AC | PRN

## 2021-05-30 MED ORDER — CYCLOBENZAPRINE HCL 10 MG PO TABS: 10.0000 mg | ORAL_TABLET | Freq: Three times a day (TID) | ORAL | 0 refills | Status: DC | PRN

## 2021-05-30 MED ORDER — ONDANSETRON HCL 4 MG PO TABS: 4.0000 mg | ORAL_TABLET | Freq: Three times a day (TID) | ORAL | 0 refills | Status: DC | PRN

## 2021-05-30 NOTE — Progress Notes (Signed)
Subjective: Kim Hicks is a 56 y.o. female patient who returns to office for follow evaluation of Left foot pain secondary to fracture injury.  Patient reports that pain is worse with use of bone stimulator states that she gets nauseous after her treatments and has vomited because the pain is so bad.  Fasting blood sugar not recorded. Last A1c 9.1  Patient Active Problem List   Diagnosis Date Noted   RLS (restless legs syndrome) 02/17/2021   Anemia 12/03/2020   Benign neoplasm of rectum    AVM (arteriovenous malformation) of colon, acquired    Portal hypertensive gastropathy ([place])    On home oxygen therapy 11/05/2020   Biliary drain displacement 11/01/2020   Iron deficiency anemia 10/03/2020   Common migraine with intractable migraine 61/60/7371   Metabolic encephalopathy    Septic shock ([place]) 07/06/2020   Acute respiratory failure with hypoxemia ([place]) 07/06/2020   Pressure injury of skin 07/04/2020   Post-ERCP acute pancreatitis 06/27/2020   Pancreatitis 06/27/2020   Abscess of gallbladder    Migration of biliary stent    Malnutrition of moderate degree 05/23/2020   Abdominal pain 05/21/2020   Biloma 05/20/2020   Biliary sludge    Complicated UTI (urinary tract infection) 03/11/2020   Encounter for removal of biliary stent    Diabetic peripheral neuropathy ([place]) 09/14/2019   Gait disturbance 09/14/2019   NASH (nonalcoholic steatohepatitis) 08/21/2019   Bile leak    Other cirrhosis of liver ([place]) 08/04/2019   Major depressive disorder, recurrent episode, severe, with psychotic behavior ([place]) 03/25/2011   DYSPHAGIA UNSPECIFIED 03/04/2009   GERD 02/18/2009   PERSISTENT VOMITING 02/18/2009   CHEST PAIN 02/18/2009   NAUSEA AND VOMITING 02/18/2009   Dehydration 07/19/2007   GASTROENTERITIS 07/19/2007   FIBROIDS, UTERUS 05/02/2007   Depression 05/02/2007   MIGRAINE HEADACHE 05/02/2007   Hypertension 05/02/2007   HEMORRHOIDS 05/02/2007   Chronic constipation 05/02/2007    IBS 05/02/2007   PSORIASIS 05/02/2007   PITUITARY NEOPLASM, HX OF 05/02/2007   LIVER FUNCTION TESTS, ABNORMAL, HX OF 05/02/2007   Diabetes mellitus without complication ([place]) 11/18/9483   Hyperlipidemia associated with type 2 diabetes mellitus ([place]) 02/04/2007   SINUSITIS, ACUTE 02/04/2007   Abdominal pain, right upper quadrant 02/04/2007   Hyperlipidemia 02/04/2007    Current Outpatient Medications on File Prior to Visit  Medication Sig Dispense Refill   ALPRAZolam (XANAX) 0.5 MG tablet Take 0.5 mg by mouth 3 (three) times daily.     amoxicillin-clavulanate (AUGMENTIN) 875-125 MG tablet Take 1 tablet by mouth 2 (two) times daily.     brexpiprazole (REXULTI) 2 MG TABS tablet Take 2 mg by mouth at bedtime.     Calcium Carbonate-Vit D-Min (CALCIUM 1200) 1200-1000 MG-UNIT CHEW Chew 1 tablet by mouth daily.     diltiazem (DILACOR XR) 120 MG 24 hr capsule Take 120 mg by mouth at bedtime.     docusate sodium (COLACE) 50 MG capsule Take 50 mg by mouth 2 (two) times daily.     Dulaglutide 4.5 MG/0.5ML SOPN Inject 4.5 mg into the skin every Monday.     DULoxetine (CYMBALTA) 30 MG capsule Take 30 mg by mouth daily.     Empagliflozin-metFORMIN HCl ER (SYNJARDY XR) 25-1000 MG TB24 Take 1 tablet by mouth daily.     ezetimibe (ZETIA) 10 MG tablet Take 10 mg by mouth daily.     Insulin Glargine (BASAGLAR KWIKPEN) 100 UNIT/ML Inject 40 Units into the skin at bedtime.     iron polysaccharides (NIFEREX) 150  MG capsule Take 1 capsule (150 mg total) by mouth daily. (Patient not taking: Reported on 02/18/2021) 30 capsule 1   lactulose (CHRONULAC) 10 GM/15ML solution Take 15 mLs (10 g total) by mouth 3 (three) times daily. 900 mL 1   LINZESS 290 MCG CAPS capsule TAKE 1 CAPSULE BY MOUTH DAILY BEFORE BREAKFAST. 90 capsule 1   lisinopril (ZESTRIL) 40 MG tablet Take 40 mg by mouth every morning.     metFORMIN (GLUCOPHAGE) 1000 MG tablet Take 1,000 mg by mouth at bedtime.     Na Sulfate-K Sulfate-Mg Sulf  17.5-3.13-1.6 GM/177ML SOLN Take per colonoscopy instructions sheet (Patient not taking: Reported on 04/10/2021) 354 mL 0   Omega-3 1000 MG CAPS Take 1,000 mg by mouth daily.     ondansetron (ZOFRAN ODT) 4 MG disintegrating tablet Take 1 tablet (4 mg total) by mouth every 8 (eight) hours as needed for nausea or vomiting. (Patient not taking: Reported on 04/10/2021) 20 tablet 0   pantoprazole (PROTONIX) 20 MG tablet Take 20 mg by mouth daily.     polyethylene glycol (GOLYTELY) 236 g solution Drink one glass of the solution every 15 minutes until gone (Patient not taking: Reported on 04/10/2021) 4000 mL 0   pramipexole (MIRAPEX) 0.25 MG tablet Take 1 tablet (0.25 mg total) by mouth at bedtime. 30 tablet 2   prazosin (MINIPRESS) 2 MG capsule Take 4 mg by mouth at bedtime.     predniSONE (STERAPRED UNI-PAK 21 TAB) 5 MG (21) TBPK tablet Take 5 mg by mouth as directed.     pregabalin (LYRICA) 100 MG capsule Two capsules in the morning and evening, one at midday (Patient taking differently: Take 100-200 mg by mouth See admin instructions. Take 200 mg in the morning, 100 mg at lunch, and 200 mg at bedtime) 450 capsule 1   QUEtiapine (SEROQUEL) 400 MG tablet Take 2 tablets (800 mg total) by mouth at bedtime.     QUEtiapine (SEROQUEL) 50 MG tablet Take 100 mg by mouth every morning.     simvastatin (ZOCOR) 10 MG tablet Take 10 mg by mouth daily.  6   terbinafine (LAMISIL) 250 MG tablet Take 250 mg by mouth daily.     traMADol ([name]) 50 MG tablet Take 50 mg by mouth 2 (two) times daily.     vortioxetine HBr ([name]) 20 MG TABS tablet Take 20 mg by mouth at bedtime.     ziprasidone ([name]) 20 MG capsule Take 20 mg by mouth at bedtime.     Current Facility-Administered Medications on File Prior to Visit  Medication Dose Route Frequency Provider Last Rate Last Admin   midazolam (VERSED) 5 MG/5ML injection   Intravenous Anesthesia Intra-op Cleda Daub, CRNA   2 mg at 02/24/21 1258    Allergies   Allergen Reactions   Dilaudid [Hydromorphone] Hives   Talwin [Pentazocine]     Turned blue around lips and face, rash all over body    Objective:  General: Alert and oriented x3 in no acute distress  Dermatology: No open lesions bilateral lower extremities, no webspace macerations, no ecchymosis bilateral, all nails x 10 are well manicured.  Vascular: Minimal trace edema to the left foot, Dorsalis Pedis and Posterior Tibial pedal pulses 1/4, Capillary Fill Time 5 seconds,(+) pedal hair growth bilateral, Temperature gradient within normal limits.  Neurology: Gross sensation intact via light touch bilateral, Subjective sharp shooting pains to left  foot and ankle.   Musculoskeletal: There is tenderness with palpation diffusely over the entire left  foot and ankle with most pain dorsolateral at the sinus tarsi and anterior calcaneus. Guarding due to pain on left with increased hypersensitivity to this area.   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, left    -  Primary   Relevant Medications   ondansetron (ZOFRAN) 4 MG tablet   oxyCODONE-acetaminophen (PERCOCET) 10-325 MG tablet   cyclobenzaprine (FLEXERIL) 10 MG tablet   Left foot pain       Relevant Medications   ondansetron (ZOFRAN) 4 MG tablet   oxyCODONE-acetaminophen (PERCOCET) 10-325 MG tablet   cyclobenzaprine (FLEXERIL) 10 MG tablet   Chronic pain of left ankle       Relevant Medications   ondansetron (ZOFRAN) 4 MG tablet   oxyCODONE-acetaminophen (PERCOCET) 10-325 MG tablet   cyclobenzaprine (FLEXERIL) 10 MG tablet   Nausea            -Complete examination performed -Re-Discussed treatement options for fracture; risks, alternatives, and benefits explained. -Cast removed -Re-Applied below the knee fiberglass cast with window for exogen bone stimulator treatment advised patient to decrease treatment 1 dose to 20  minutes once a day -Prescribed Zofran for patient to take 15 minutes prior to her bone stimulator treatment -Refilled Percocet and Flexeril -Recommend protection, rest, ice elevation daily until symptoms improve -Return to office in 2 weeks for cast change and x-rays or sooner if problems or issues arise  Landis Martins, DPM

## 2021-05-30 NOTE — Patient Instructions (Signed)
Kim Hicks [phone]

## 2021-06-13 ENCOUNTER — Encounter: Payer: Self-pay | Admitting: Sports Medicine

## 2021-06-13 ENCOUNTER — Ambulatory Visit (INDEPENDENT_AMBULATORY_CARE_PROVIDER_SITE_OTHER): Payer: Medicare Other

## 2021-06-13 ENCOUNTER — Ambulatory Visit (INDEPENDENT_AMBULATORY_CARE_PROVIDER_SITE_OTHER): Payer: Medicare Other | Admitting: Sports Medicine

## 2021-06-13 ENCOUNTER — Other Ambulatory Visit: Payer: Self-pay

## 2021-06-13 DIAGNOSIS — M25572 Pain in left ankle and joints of left foot: Secondary | ICD-10-CM | POA: Diagnosis not present

## 2021-06-13 DIAGNOSIS — G8929 Other chronic pain: Secondary | ICD-10-CM | POA: Diagnosis not present

## 2021-06-13 DIAGNOSIS — M79672 Pain in left foot: Secondary | ICD-10-CM

## 2021-06-13 DIAGNOSIS — S92062K Displaced intraarticular fracture of left calcaneus, subsequent encounter for fracture with nonunion: Secondary | ICD-10-CM

## 2021-06-13 MED ORDER — OXYCODONE-ACETAMINOPHEN 10-325 MG PO TABS: 1.0000 | ORAL_TABLET | Freq: Three times a day (TID) | ORAL | 0 refills | Status: AC | PRN

## 2021-06-13 MED ORDER — CYCLOBENZAPRINE HCL 10 MG PO TABS: 10.0000 mg | ORAL_TABLET | Freq: Three times a day (TID) | ORAL | 0 refills | Status: DC | PRN

## 2021-06-13 MED ORDER — ONDANSETRON HCL 4 MG PO TABS: 4.0000 mg | ORAL_TABLET | Freq: Three times a day (TID) | ORAL | 0 refills | Status: DC | PRN

## 2021-06-13 NOTE — Progress Notes (Signed)
Subjective: Kim Hicks is a 56 y.o. female patient who returns to office for follow evaluation of Left foot pain secondary to fracture injury.  Patient reports that pain is better than it was can tolerate 1 treatment for 20 minutes and only missed 1 day since last encounter states that she does have sharp pain over the area and significant stiffness of the toes.  Patient reports that the nausea episodes have been controlled but if you touch her foot just right the pain is very intense that it does make her feel sick. Fasting blood sugar not recorded. Last A1c 9.1  Patient Active Problem List   Diagnosis Date Noted   RLS (restless legs syndrome) 02/17/2021   Anemia 12/03/2020   Benign neoplasm of rectum    AVM (arteriovenous malformation) of colon, acquired    Portal hypertensive gastropathy ([place])    On home oxygen therapy 11/05/2020   Biliary drain displacement 11/01/2020   Iron deficiency anemia 10/03/2020   Common migraine with intractable migraine 17/40/8144   Metabolic encephalopathy    Septic shock ([place]) 07/06/2020   Acute respiratory failure with hypoxemia ([place]) 07/06/2020   Pressure injury of skin 07/04/2020   Post-ERCP acute pancreatitis 06/27/2020   Pancreatitis 06/27/2020   Abscess of gallbladder    Migration of biliary stent    Malnutrition of moderate degree 05/23/2020   Abdominal pain 05/21/2020   Biloma 05/20/2020   Biliary sludge    Complicated UTI (urinary tract infection) 03/11/2020   Encounter for removal of biliary stent    Diabetic peripheral neuropathy ([place]) 09/14/2019   Gait disturbance 09/14/2019   NASH (nonalcoholic steatohepatitis) 08/21/2019   Bile leak    Other cirrhosis of liver ([place]) 08/04/2019   Major depressive disorder, recurrent episode, severe, with psychotic behavior ([place]) 03/25/2011   DYSPHAGIA UNSPECIFIED 03/04/2009   GERD 02/18/2009   PERSISTENT VOMITING 02/18/2009   CHEST PAIN 02/18/2009   NAUSEA AND VOMITING 02/18/2009   Dehydration  07/19/2007   GASTROENTERITIS 07/19/2007   FIBROIDS, UTERUS 05/02/2007   Depression 05/02/2007   MIGRAINE HEADACHE 05/02/2007   Hypertension 05/02/2007   HEMORRHOIDS 05/02/2007   Chronic constipation 05/02/2007   IBS 05/02/2007   PSORIASIS 05/02/2007   PITUITARY NEOPLASM, HX OF 05/02/2007   LIVER FUNCTION TESTS, ABNORMAL, HX OF 05/02/2007   Diabetes mellitus without complication ([place]) 81/85/6314   Hyperlipidemia associated with type 2 diabetes mellitus ([place]) 02/04/2007   SINUSITIS, ACUTE 02/04/2007   Abdominal pain, right upper quadrant 02/04/2007   Hyperlipidemia 02/04/2007    Current Outpatient Medications on File Prior to Visit  Medication Sig Dispense Refill   ALPRAZolam (XANAX) 0.5 MG tablet Take 0.5 mg by mouth 3 (three) times daily.     amoxicillin-clavulanate (AUGMENTIN) 875-125 MG tablet Take 1 tablet by mouth 2 (two) times daily.     brexpiprazole (REXULTI) 2 MG TABS tablet Take 2 mg by mouth at bedtime.     Calcium Carbonate-Vit D-Min (CALCIUM 1200) 1200-1000 MG-UNIT CHEW Chew 1 tablet by mouth daily.     diltiazem (DILACOR XR) 120 MG 24 hr capsule Take 120 mg by mouth at bedtime.     docusate sodium (COLACE) 50 MG capsule Take 50 mg by mouth 2 (two) times daily.     Dulaglutide 4.5 MG/0.5ML SOPN Inject 4.5 mg into the skin every Monday.     DULoxetine (CYMBALTA) 30 MG capsule Take 30 mg by mouth daily.     Empagliflozin-metFORMIN HCl ER (SYNJARDY XR) 25-1000 MG TB24 Take 1 tablet by mouth daily.  ezetimibe (ZETIA) 10 MG tablet Take 10 mg by mouth daily.     Insulin Glargine (BASAGLAR KWIKPEN) 100 UNIT/ML Inject 40 Units into the skin at bedtime.     iron polysaccharides (NIFEREX) 150 MG capsule Take 1 capsule (150 mg total) by mouth daily. (Patient not taking: Reported on 02/18/2021) 30 capsule 1   lactulose (CHRONULAC) 10 GM/15ML solution Take 15 mLs (10 g total) by mouth 3 (three) times daily. 900 mL 1   LINZESS 290 MCG CAPS capsule TAKE 1 CAPSULE BY MOUTH DAILY  BEFORE BREAKFAST. 90 capsule 1   lisinopril (ZESTRIL) 40 MG tablet Take 40 mg by mouth every morning.     metFORMIN (GLUCOPHAGE) 1000 MG tablet Take 1,000 mg by mouth at bedtime.     Na Sulfate-K Sulfate-Mg Sulf 17.5-3.13-1.6 GM/177ML SOLN Take per colonoscopy instructions sheet (Patient not taking: Reported on 04/10/2021) 354 mL 0   Omega-3 1000 MG CAPS Take 1,000 mg by mouth daily.     ondansetron (ZOFRAN ODT) 4 MG disintegrating tablet Take 1 tablet (4 mg total) by mouth every 8 (eight) hours as needed for nausea or vomiting. (Patient not taking: Reported on 04/10/2021) 20 tablet 0   pantoprazole (PROTONIX) 20 MG tablet Take 20 mg by mouth daily.     polyethylene glycol (GOLYTELY) 236 g solution Drink one glass of the solution every 15 minutes until gone (Patient not taking: Reported on 04/10/2021) 4000 mL 0   pramipexole (MIRAPEX) 0.25 MG tablet Take 1 tablet (0.25 mg total) by mouth at bedtime. 30 tablet 2   prazosin (MINIPRESS) 2 MG capsule Take 4 mg by mouth at bedtime.     predniSONE (STERAPRED UNI-PAK 21 TAB) 5 MG (21) TBPK tablet Take 5 mg by mouth as directed.     pregabalin (LYRICA) 100 MG capsule Two capsules in the morning and evening, one at midday (Patient taking differently: Take 100-200 mg by mouth See admin instructions. Take 200 mg in the morning, 100 mg at lunch, and 200 mg at bedtime) 450 capsule 1   QUEtiapine (SEROQUEL) 400 MG tablet Take 2 tablets (800 mg total) by mouth at bedtime.     QUEtiapine (SEROQUEL) 50 MG tablet Take 100 mg by mouth every morning.     simvastatin (ZOCOR) 10 MG tablet Take 10 mg by mouth daily.  6   terbinafine (LAMISIL) 250 MG tablet Take 250 mg by mouth daily.     traMADol ([name]) 50 MG tablet Take 50 mg by mouth 2 (two) times daily.     vortioxetine HBr ([name]) 20 MG TABS tablet Take 20 mg by mouth at bedtime.     ziprasidone ([name]) 20 MG capsule Take 20 mg by mouth at bedtime.     Current Facility-Administered Medications on File Prior  to Visit  Medication Dose Route Frequency Provider Last Rate Last Admin   midazolam (VERSED) 5 MG/5ML injection   Intravenous Anesthesia Intra-op Cleda Daub, CRNA   2 mg at 02/24/21 1258    Allergies  Allergen Reactions   Dilaudid [Hydromorphone] Hives   Talwin [Pentazocine]     Turned blue around lips and face, rash all over body    Objective:  General: Alert and oriented x3 in no acute distress  Dermatology: No open lesions bilateral lower extremities, no webspace macerations, no ecchymosis bilateral, all nails x 10 are well manicured.  Vascular: Minimal trace edema to the left foot, Dorsalis Pedis and Posterior Tibial pedal pulses 1/4, Capillary Fill Time 5 seconds,(+) pedal hair growth bilateral,  Temperature gradient within normal limits.  Neurology: Gross sensation intact via light touch bilateral, Subjective sharp shooting pains to left  foot and ankle.   Musculoskeletal: There is tenderness with palpation diffusely over the entire left foot and ankle with most pain dorsolateral at the sinus tarsi and anterior calcaneus. Guarding due to pain on left with increased hypersensitivity to this area as previously noted.  Subjective limited range of motion at metatarsophalangeal joints.  There is mild guarding due to pain.  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, left    -  Primary   Relevant Medications   cyclobenzaprine (FLEXERIL) 10 MG tablet   ondansetron (ZOFRAN) 4 MG tablet   Other Relevant Orders   DG Foot Complete Left   Left foot pain       Relevant Medications   cyclobenzaprine (FLEXERIL) 10 MG tablet   ondansetron (ZOFRAN) 4 MG tablet   Chronic pain of left ankle       Relevant Medications   cyclobenzaprine (FLEXERIL) 10 MG tablet   ondansetron (ZOFRAN) 4 MG  tablet   oxyCODONE-acetaminophen (PERCOCET) 10-325 MG tablet        -Complete examination performed -Re-Discussed treatement options for fracture; risks, alternatives, and benefits explained. -X-rays reviewed -Cast removed -Re-Applied below the knee fiberglass cast with window for exogen bone stimulator treatment and advised patient to continue with 1 treatment for 20 minutes a day -Refilled Zofran for patient to take 15 minutes prior to her bone stimulator treatment -Refilled Percocet and Flexeril -Recommend protection, rest, ice elevation daily until symptoms improve -Return to office in 2 weeks for cast change or sooner if problems or issues arise  Landis Martins, DPM

## 2021-06-16 ENCOUNTER — Encounter (HOSPITAL_COMMUNITY): Payer: Self-pay | Admitting: Gastroenterology

## 2021-06-16 NOTE — Progress Notes (Signed)
Attempted to obtain medical history via telephone, unable to reach at this time. I left a voicemail to return pre surgical testing department's phone call.  

## 2021-06-23 NOTE — Anesthesia Preprocedure Evaluation (Addendum)
Anesthesia Evaluation  Patient identified by MRN, date of birth, ID band Patient awake    Reviewed: Allergy & Precautions, NPO status , Patient's Chart, lab work & pertinent test results  Airway Mallampati: III  TM Distance: >3 FB Neck ROM: Full    Dental  (+) Teeth Intact, Dental Advisory Given   Pulmonary sleep apnea (2L @ night) and Oxygen sleep apnea ,    Pulmonary exam normal breath sounds clear to auscultation       Cardiovascular hypertension, Pt. on medications Normal cardiovascular exam Rhythm:Regular Rate:Normal     Neuro/Psych  Headaches, PSYCHIATRIC DISORDERS Anxiety Depression  Neuromuscular disease    GI/Hepatic GERD  ,(+) Cirrhosis       , Bile duct leak    Endo/Other  diabetes, Type 2, Insulin Dependent, Oral Hypoglycemic Agents  Renal/GU negative Renal ROS     Musculoskeletal negative musculoskeletal ROS (+)   Abdominal   Peds  Hematology negative hematology ROS (+)   Anesthesia Other Findings   Reproductive/Obstetrics                            Anesthesia Physical Anesthesia Plan  ASA: 3  Anesthesia Plan: General   Post-op Pain Management:    Induction: Intravenous  PONV Risk Score and Plan: 3 and Treatment may vary due to age or medical condition, Midazolam, Dexamethasone and Ondansetron  Airway Management Planned: Oral ETT  Additional Equipment:   Intra-op Plan:   Post-operative Plan: Extubation in OR  Informed Consent: I have reviewed the patients History and Physical, chart, labs and discussed the procedure including the risks, benefits and alternatives for the proposed anesthesia with the patient or authorized representative who has indicated his/her understanding and acceptance.     Dental advisory given  Plan Discussed with: CRNA  Anesthesia Plan Comments:        Anesthesia Quick Evaluation

## 2021-06-24 ENCOUNTER — Other Ambulatory Visit: Payer: Self-pay

## 2021-06-24 ENCOUNTER — Encounter (HOSPITAL_COMMUNITY)
Admission: RE | Disposition: A | Discharge status: Home / Self Care | Payer: Self-pay | Source: Home / Self Care | Attending: Gastroenterology

## 2021-06-24 ENCOUNTER — Encounter (HOSPITAL_COMMUNITY): Payer: Self-pay | Admitting: Gastroenterology

## 2021-06-24 ENCOUNTER — Ambulatory Visit (HOSPITAL_COMMUNITY)
Admission: RE | Admit: 2021-06-24 | Discharge: 2021-06-24 | Disposition: A | Discharge status: Home / Self Care | Payer: Medicare Other | Attending: Gastroenterology | Admitting: Gastroenterology

## 2021-06-24 ENCOUNTER — Ambulatory Visit (HOSPITAL_COMMUNITY): Payer: Medicare Other

## 2021-06-24 ENCOUNTER — Ambulatory Visit (HOSPITAL_COMMUNITY): Payer: Medicare Other | Admitting: Anesthesiology

## 2021-06-24 ENCOUNTER — Telehealth: Payer: Self-pay

## 2021-06-24 DIAGNOSIS — G4730 Sleep apnea, unspecified: Secondary | ICD-10-CM | POA: Insufficient documentation

## 2021-06-24 DIAGNOSIS — Z4659 Encounter for fitting and adjustment of other gastrointestinal appliance and device: Secondary | ICD-10-CM | POA: Diagnosis present

## 2021-06-24 DIAGNOSIS — Z09 Encounter for follow-up examination after completed treatment for conditions other than malignant neoplasm: Secondary | ICD-10-CM | POA: Diagnosis not present

## 2021-06-24 DIAGNOSIS — K839 Disease of biliary tract, unspecified: Secondary | ICD-10-CM

## 2021-06-24 DIAGNOSIS — K838 Other specified diseases of biliary tract: Secondary | ICD-10-CM | POA: Diagnosis not present

## 2021-06-24 DIAGNOSIS — E1140 Type 2 diabetes mellitus with diabetic neuropathy, unspecified: Secondary | ICD-10-CM | POA: Insufficient documentation

## 2021-06-24 DIAGNOSIS — Z9049 Acquired absence of other specified parts of digestive tract: Secondary | ICD-10-CM | POA: Insufficient documentation

## 2021-06-24 DIAGNOSIS — Z9981 Dependence on supplemental oxygen: Secondary | ICD-10-CM | POA: Insufficient documentation

## 2021-06-24 DIAGNOSIS — Z9689 Presence of other specified functional implants: Secondary | ICD-10-CM | POA: Insufficient documentation

## 2021-06-24 DIAGNOSIS — K819 Cholecystitis, unspecified: Secondary | ICD-10-CM

## 2021-06-24 DIAGNOSIS — I10 Essential (primary) hypertension: Secondary | ICD-10-CM | POA: Insufficient documentation

## 2021-06-24 HISTORY — PX: STENT REMOVAL: SHX6421

## 2021-06-24 HISTORY — PX: REMOVAL OF STONES: SHX5545

## 2021-06-24 HISTORY — PX: ENDOSCOPIC RETROGRADE CHOLANGIOPANCREATOGRAPHY (ERCP) WITH PROPOFOL: SHX5810

## 2021-06-24 LAB — GLUCOSE, CAPILLARY: Glucose-Capillary: 122 mg/dL — ABNORMAL HIGH (ref 70–99)

## 2021-06-24 SURGERY — ENDOSCOPIC RETROGRADE CHOLANGIOPANCREATOGRAPHY (ERCP) WITH PROPOFOL
Anesthesia: General

## 2021-06-24 MED ORDER — LACTATED RINGERS IV SOLN
INTRAVENOUS | Status: DC | PRN
Start: 2021-06-24 — End: 2021-06-24

## 2021-06-24 MED ORDER — ALBUMIN HUMAN 5 % IV SOLN
INTRAVENOUS | Status: DC | PRN
Start: 2021-06-24 — End: 2021-06-24

## 2021-06-24 MED ORDER — CIPROFLOXACIN IN D5W 400 MG/200ML IV SOLN
INTRAVENOUS | Status: DC | PRN
  Administered 2021-06-24: 400 mg via INTRAVENOUS

## 2021-06-24 MED ORDER — LACTATED RINGERS IV SOLN
INTRAVENOUS | Status: AC | PRN
  Administered 2021-06-24: 1000 mL via INTRAVENOUS

## 2021-06-24 MED ORDER — PHENYLEPHRINE HCL (PRESSORS) 10 MG/ML IV SOLN
INTRAVENOUS | Status: AC
  Filled 2021-06-24: qty 1

## 2021-06-24 MED ORDER — GLUCAGON HCL RDNA (DIAGNOSTIC) 1 MG IJ SOLR
INTRAMUSCULAR | Status: AC
  Filled 2021-06-24: qty 1

## 2021-06-24 MED ORDER — PROPOFOL 10 MG/ML IV BOLUS
INTRAVENOUS | Status: AC
  Filled 2021-06-24: qty 20

## 2021-06-24 MED ORDER — SODIUM CHLORIDE 0.9 % IV SOLN
INTRAVENOUS | Status: DC | PRN
  Administered 2021-06-24: 40 mL

## 2021-06-24 MED ORDER — ONDANSETRON HCL 4 MG/2ML IJ SOLN
INTRAMUSCULAR | Status: DC | PRN
  Administered 2021-06-24: 4 mg via INTRAVENOUS

## 2021-06-24 MED ORDER — PROPOFOL 10 MG/ML IV BOLUS
INTRAVENOUS | Status: DC | PRN
  Administered 2021-06-24: 100 mg via INTRAVENOUS
  Administered 2021-06-24: 40 mg via INTRAVENOUS

## 2021-06-24 MED ORDER — FENTANYL CITRATE (PF) 100 MCG/2ML IJ SOLN
INTRAMUSCULAR | Status: DC | PRN
Start: 2021-06-24 — End: 2021-06-24
  Administered 2021-06-24 (×2): 50 ug via INTRAVENOUS

## 2021-06-24 MED ORDER — ROCURONIUM BROMIDE 10 MG/ML (PF) SYRINGE
PREFILLED_SYRINGE | INTRAVENOUS | Status: DC | PRN
  Administered 2021-06-24: 60 mg via INTRAVENOUS

## 2021-06-24 MED ORDER — LACTATED RINGERS IV SOLN: INTRAVENOUS | Status: DC

## 2021-06-24 MED ORDER — ALBUMIN HUMAN 5 % IV SOLN
INTRAVENOUS | Status: AC
  Filled 2021-06-24: qty 250

## 2021-06-24 MED ORDER — LIDOCAINE 2% (20 MG/ML) 5 ML SYRINGE
INTRAMUSCULAR | Status: DC | PRN
  Administered 2021-06-24: 80 mg via INTRAVENOUS

## 2021-06-24 MED ORDER — EPHEDRINE SULFATE (PRESSORS) 50 MG/ML IJ SOLN
INTRAMUSCULAR | Status: DC | PRN
Start: 2021-06-24 — End: 2021-06-24
  Administered 2021-06-24 (×2): 10 mg via INTRAVENOUS
  Administered 2021-06-24 (×2): 5 mg via INTRAVENOUS
  Administered 2021-06-24 (×2): 10 mg via INTRAVENOUS

## 2021-06-24 MED ORDER — PHENYLEPHRINE 40 MCG/ML (10ML) SYRINGE FOR IV PUSH (FOR BLOOD PRESSURE SUPPORT)
PREFILLED_SYRINGE | INTRAVENOUS | Status: DC | PRN
  Administered 2021-06-24 (×3): 80 ug via INTRAVENOUS
  Administered 2021-06-24: 120 ug via INTRAVENOUS
  Administered 2021-06-24: 80 ug via INTRAVENOUS
  Administered 2021-06-24: 160 ug via INTRAVENOUS
  Administered 2021-06-24: 80 ug via INTRAVENOUS

## 2021-06-24 MED ORDER — PHENYLEPHRINE HCL-NACL 20-0.9 MG/250ML-% IV SOLN
INTRAVENOUS | Status: DC | PRN
  Administered 2021-06-24: 150 ug/min via INTRAVENOUS
  Administered 2021-06-24: 125 ug/min via INTRAVENOUS

## 2021-06-24 MED ORDER — SODIUM CHLORIDE 0.9 % IV SOLN: INTRAVENOUS | Status: DC

## 2021-06-24 MED ORDER — VASOPRESSIN 20 UNIT/ML IV SOLN
INTRAVENOUS | Status: AC
  Filled 2021-06-24: qty 1

## 2021-06-24 MED ORDER — CIPROFLOXACIN HCL 500 MG PO TABS: 500.0000 mg | ORAL_TABLET | Freq: Two times a day (BID) | ORAL | 0 refills | Status: AC

## 2021-06-24 MED ORDER — FENTANYL CITRATE (PF) 100 MCG/2ML IJ SOLN
INTRAMUSCULAR | Status: AC
  Filled 2021-06-24: qty 2

## 2021-06-24 MED ORDER — INDOMETHACIN 50 MG RE SUPP
RECTAL | Status: AC
  Filled 2021-06-24: qty 2

## 2021-06-24 MED ORDER — INDOMETHACIN 50 MG RE SUPP
RECTAL | Status: DC | PRN
  Administered 2021-06-24: 100 mg via RECTAL

## 2021-06-24 MED ORDER — CIPROFLOXACIN IN D5W 400 MG/200ML IV SOLN
INTRAVENOUS | Status: AC
  Filled 2021-06-24: qty 200

## 2021-06-24 MED ORDER — HYDROCORTISONE ACETATE 25 MG RE SUPP: 25.0000 mg | Freq: Two times a day (BID) | RECTAL | 1 refills | Status: AC

## 2021-06-24 MED ORDER — DEXAMETHASONE SODIUM PHOSPHATE 4 MG/ML IJ SOLN
INTRAMUSCULAR | Status: DC | PRN
  Administered 2021-06-24: 8 mg via INTRAVENOUS

## 2021-06-24 NOTE — Anesthesia Postprocedure Evaluation (Signed)
Anesthesia Post Note  Patient: Kim Hicks  Procedure(s) Performed: ENDOSCOPIC RETROGRADE CHOLANGIOPANCREATOGRAPHY (ERCP) WITH PROPOFOL STENT REMOVAL REMOVAL OF STONES     Patient location during evaluation: Endoscopy Anesthesia Type: General Level of consciousness: awake and alert Pain management: pain level controlled Vital Signs Assessment: post-procedure vital signs reviewed and stable Respiratory status: spontaneous breathing, nonlabored ventilation, respiratory function stable and patient connected to nasal cannula oxygen Cardiovascular status: blood pressure returned to baseline and stable Postop Assessment: no apparent nausea or vomiting Anesthetic complications: no   No notable events documented.  Last Vitals:  Vitals:   06/24/21 0900 06/24/21 0910  BP: (!) 130/50 (!) 127/56  Pulse: 91 92  Resp: (!) 21 14  Temp:    SpO2: 96% 98%    Last Pain:  Vitals:   06/24/21 0910  TempSrc:   PainSc: 0-No pain                 Santa Lighter

## 2021-06-24 NOTE — Telephone Encounter (Signed)
-----   Message from [name]., MD sent at 06/24/2021  9:06 AM EST ----- Regarding: Follow-up MS, The biliary leak is no longer present.  I certainly see that she has a decent sized gallbladder remnant from her subtotal cholecystectomy.  She has no stent in place at this time.  I think it would be reasonable to get updated cross-sectional imaging to see how things look in a few weeks so that long-term follow-up can be made.  I told her there was a small chance she could develop cholecystitis again in the future, but hopefully there is no evidence of any stones that we have to deal with that again and with her biliary sphincterotomy she would hopefully be in good straits. Terita Hejl, can you forward this to Dr. Silvio Pate nurse so that she/he can order a CT abdomen with IV and oral contrast and that can be done over the course of the next 3 to 6 weeks. Thanks. GM

## 2021-06-24 NOTE — Discharge Instructions (Signed)
YOU HAD AN ENDOSCOPIC PROCEDURE TODAY: Refer to the procedure report and other information in the discharge instructions given to you for any specific questions about what was found during the examination. If this information does not answer your questions, please call [place] office at [phone] to clarify.   YOU SHOULD EXPECT: Some feelings of bloating in the abdomen. Passage of more gas than usual. Walking can help get rid of the air that was put into your GI tract during the procedure and reduce the bloating. If you had a lower endoscopy (such as a colonoscopy or flexible sigmoidoscopy) you may notice spotting of blood in your stool or on the toilet paper. Some abdominal soreness may be present for a day or two, also.  DIET: Your first meal following the procedure should be a light meal and then it is ok to progress to your normal diet. A half-sandwich or bowl of soup is an example of a good first meal. Heavy or fried foods are harder to digest and may make you feel nauseous or bloated. Drink plenty of fluids but you should avoid alcoholic beverages for 24 hours. If you had a esophageal dilation, please see attached instructions for diet.    ACTIVITY: Your care partner should take you home directly after the procedure. You should plan to take it easy, moving slowly for the rest of the day. You can resume normal activity the day after the procedure however YOU SHOULD NOT DRIVE, use power tools, machinery or perform tasks that involve climbing or major physical exertion for 24 hours (because of the sedation medicines used during the test).   SYMPTOMS TO REPORT IMMEDIATELY: A gastroenterologist can be reached at any hour. Please call [phone]  for any of the following symptoms:   Following upper endoscopy (EGD, EUS, ERCP, esophageal dilation) Vomiting of blood or coffee ground material  New, significant abdominal pain  New, significant chest pain or pain under the shoulder blades  Painful or  persistently difficult swallowing  New shortness of breath  Black, tarry-looking or red, bloody stools  FOLLOW UP:  If any biopsies were taken you will be contacted by phone or by letter within the next 1-3 weeks. Call [phone]  if you have not heard about the biopsies in 3 weeks.  Please also call with any specific questions about appointments or follow up tests.  

## 2021-06-24 NOTE — Transfer of Care (Signed)
Immediate Anesthesia Transfer of Care Note  Patient: Kim Hicks  Procedure(s) Performed: Procedure(s): ENDOSCOPIC RETROGRADE CHOLANGIOPANCREATOGRAPHY (ERCP) WITH PROPOFOL (N/A) STENT REMOVAL REMOVAL OF STONES  Patient Location: PACU  Anesthesia Type:General  Level of Consciousness: Patient easily awoken, sedated, comfortable, cooperative, following commands, responds to stimulation.   Airway & Oxygen Therapy: Patient spontaneously breathing, ventilating well, oxygen via simple oxygen mask.  Post-op Assessment: Report given to PACU RN, vital signs reviewed and stable, moving all extremities.   Post vital signs: Reviewed and stable.  Complications: No apparent anesthesia complications Last Vitals:  Vitals Value Taken Time  BP 127/56 06/24/21 0910  Temp 36.2 C 06/24/21 0844  Pulse 92 06/24/21 0910  Resp 20 06/24/21 0910  SpO2 96 % 06/24/21 0910  Vitals shown include unvalidated device data.  Last Pain:  Vitals:   06/24/21 0910  TempSrc:   PainSc: 0-No pain         Complications: No notable events documented.

## 2021-06-24 NOTE — H&P (Signed)
GASTROENTEROLOGY PROCEDURE H&P NOTE   Primary Care Physician: [name], FNP  HPI: Kim Hicks is a 56 y.o. female who presents for ERCP for biliary stent removal/exchange for previous biliary leak - hopeful it is not an issue anymore.  Past Medical History:  Diagnosis Date   Anxiety    Cataract    REMOVED BOTH   Cirrhosis of liver (HCC)    Common migraine with intractable migraine 08/07/2020   Depression    Diabetes mellitus without complication (HCC)    Dysrhythmia    Edema    Family history of adverse reaction to anesthesia    mother had trouble waking up after surgery   Foot fracture, left    Headache    MIGRAINES   Hyperlipidemia    Hypertension    Neuromuscular disorder (HCC)    NEUROPATHY-both hands and feet   On supplemental oxygen by nasal cannula    2L at bedtime only NO CPAP JUST 02 PER PT   Oxygen deficiency    Palpitations    RLS (restless legs syndrome) 02/17/2021   Sleep apnea    Wheezing    Past Surgical History:  Procedure Laterality Date   ABDOMINAL HYSTERECTOMY     one ovary left   BILIARY STENT PLACEMENT N/A 08/07/2019   Procedure: BILIARY STENT PLACEMENT;  Surgeon: [name], MD;  Location: [facility];  Service: Endoscopy;  Laterality: N/A;   BILIARY STENT PLACEMENT N/A 11/07/2019   Procedure: BILIARY STENT PLACEMENT;  Surgeon: [name], MD;  [facility];  Service: Endoscopy;  Laterality: N/A;   BILIARY STENT PLACEMENT N/A 05/13/2020   Procedure: BILIARY STENT PLACEMENT;  Surgeon: [name], MD;  [facility];  Service: Endoscopy;  Laterality: N/A;   BILIARY STENT PLACEMENT N/A 05/27/2020   Procedure: BILIARY STENT PLACEMENT;  Surgeon: [name], MD;  [facility];  Service: Endoscopy;  Laterality: N/A;   BILIARY STENT PLACEMENT N/A 06/26/2020   Procedure: BILIARY STENT PLACEMENT;  Surgeon: [name], MD;  [facility];  Service: Gastroenterology;  Laterality:  N/A;   BILIARY STENT PLACEMENT N/A 09/30/2020   Procedure: BILIARY STENT PLACEMENT;  Surgeon: [name], MD;  [facility];  Service: Gastroenterology;  Laterality: N/A;   BIOPSY  06/26/2020   Procedure: BIOPSY;  Surgeon: [name], MD;  Location: [facility];  Service: Gastroenterology;;   CATARACT EXTRACTION W/PHACO Right 01/11/2018   Procedure: CATARACT EXTRACTION PHACO AND INTRAOCULAR LENS PLACEMENT ([place]);  Surgeon: [name], MD;  Location: [facility];  Service: Ophthalmology;  Laterality: Right;  [place] 00:57.5 AP% 15.0 CDE 8.61 Fluid Pack Lot # [id number]   CATARACT EXTRACTION W/PHACO Left 02/15/2018   Procedure: CATARACT EXTRACTION PHACO AND INTRAOCULAR LENS PLACEMENT ([place]);  Surgeon: [name], MD;  Location: [facility];  Service: Ophthalmology;  Laterality: Left;  [place] 00:51 AP% 13.2 CDE 6.82 Fluid p ack lot # [id number]   CHOLECYSTECTOMY N/A 08/04/2019   Procedure: LAPAROSCOPIC CHOLECYSTECTOMY;  Surgeon: [name], MD;  [facility]: [facility];  Service: General;  Laterality: N/A;   COLONOSCOPY     COLONOSCOPY WITH PROPOFOL N/A 12/02/2020   Procedure: COLONOSCOPY WITH PROPOFOL;  Surgeon: [name], MD;  [facility];  Service: Endoscopy;  Laterality: N/A;   ENDOSCOPIC RETROGRADE CHOLANGIOPANCREATOGRAPHY (ERCP) WITH PROPOFOL N/A 11/07/2019   Procedure: ENDOSCOPIC RETROGRADE CHOLANGIOPANCREATOGRAPHY (ERCP) WITH PROPOFOL;  Surgeon: [name], MD;  [facility];  Service: Endoscopy;  Laterality: N/A;  ENDOSCOPIC RETROGRADE CHOLANGIOPANCREATOGRAPHY (ERCP) WITH PROPOFOL N/A 05/13/2020   Procedure: ENDOSCOPIC RETROGRADE CHOLANGIOPANCREATOGRAPHY (ERCP) WITH PROPOFOL;  Surgeon: [name], MD;  [facility];  Service: Endoscopy;  Laterality: N/A;   ENDOSCOPIC RETROGRADE CHOLANGIOPANCREATOGRAPHY (ERCP) WITH PROPOFOL N/A 06/26/2020   Procedure: ENDOSCOPIC RETROGRADE CHOLANGIOPANCREATOGRAPHY (ERCP) WITH  PROPOFOL;  Surgeon: [name], MD;  [facility];  Service: Gastroenterology;  Laterality: N/A;   ENDOSCOPIC RETROGRADE CHOLANGIOPANCREATOGRAPHY (ERCP) WITH PROPOFOL N/A 09/30/2020   Procedure: ENDOSCOPIC RETROGRADE CHOLANGIOPANCREATOGRAPHY (ERCP) WITH PROPOFOL;  Surgeon: [name], MD;  [facility];  Service: Gastroenterology;  Laterality: N/A;   ERCP N/A 08/07/2019   Procedure: ENDOSCOPIC RETROGRADE CHOLANGIOPANCREATOGRAPHY (ERCP);  Surgeon: [name], MD;  Location: [facility];  Service: Endoscopy;  Laterality: N/A;   ERCP N/A 05/27/2020   Procedure: ENDOSCOPIC RETROGRADE CHOLANGIOPANCREATOGRAPHY (ERCP);  Surgeon: [name], MD;  Location: [facility];  Service: Endoscopy;  Laterality: N/A;   ESOPHAGOGASTRODUODENOSCOPY N/A 06/26/2020   Procedure: ESOPHAGOGASTRODUODENOSCOPY (EGD);  Surgeon: [name]., MD;  Location: [facility];  Service: Gastroenterology;  Laterality: N/A;   ESOPHAGOGASTRODUODENOSCOPY (EGD) WITH PROPOFOL N/A 12/02/2020   Procedure: ESOPHAGOGASTRODUODENOSCOPY (EGD) WITH PROPOFOL;  Surgeon: [name], MD;  [facility];  Service: Endoscopy;  Laterality: N/A;   HOT HEMOSTASIS N/A 12/02/2020   Procedure: HOT HEMOSTASIS (ARGON PLASMA COAGULATION/BICAP);  Surgeon: [name], MD;  Location: [facility];  Service: Endoscopy;  Laterality: N/A;   IR ANGIOGRAM VISCERAL SELECTIVE  12/27/2020   IR CATHETER TUBE CHANGE  12/16/2020   IR CHOLANGIOGRAM EXISTING TUBE  10/16/2020   IR CHOLANGIOGRAM EXISTING TUBE  01/16/2021   IR EMBO VENOUS NOT HEMORR HEMANG  INC GUIDE ROADMAPPING  12/25/2020   IR EXCHANGE BILIARY DRAIN  07/10/2020   IR EXCHANGE BILIARY DRAIN  08/28/2020   IR EXCHANGE BILIARY DRAIN  11/26/2020   IR EXCHANGE BILIARY DRAIN  12/27/2020   IR FLUORO RM 30-60 MIN  01/24/2021   IR RADIOLOGIST EVAL & MGMT  06/20/2020   IR RADIOLOGIST EVAL & MGMT  12/13/2020   IR RADIOLOGIST EVAL & MGMT  02/18/2021    POLYPECTOMY  12/02/2020   Procedure: POLYPECTOMY;  Surgeon: [name], MD;  [facility];  Service: Endoscopy;;   RCR Bilateral    REMOVAL OF STONES  08/07/2019   Procedure: REMOVAL OF STONES;  Surgeon: [name], MD;  [facility];  Service: Endoscopy;;  balloon sweep, no stones   REMOVAL OF STONES  05/27/2020   Procedure: REMOVAL OF STONES;  Surgeon: [name], MD;  [facility];  Service: Endoscopy;;   REMOVAL OF STONES  06/26/2020   Procedure: REMOVAL OF STONES;  Surgeon: [name]., MD;  Location: [facility];  Service: Gastroenterology;;   REMOVAL OF STONES  09/30/2020   Procedure: REMOVAL OF STONES;  Surgeon: [name]., MD;  Location: [facility];  Service: Gastroenterology;;   [name]  08/07/2019   Procedure: [name];  Surgeon: [name], MD;  [facility];  Service: Endoscopy;;   STENT REMOVAL  11/07/2019   Procedure: STENT REMOVAL;  Surgeon: [name], MD;  [facility];  Service: Endoscopy;;   STENT REMOVAL  05/13/2020   Procedure: STENT REMOVAL;  Surgeon: [name], MD;  [facility];  Service: Endoscopy;;   STENT REMOVAL  05/27/2020   Procedure: STENT REMOVAL;  Surgeon: [name], MD;  [facility];  Service: Endoscopy;;   STENT REMOVAL  06/26/2020  Procedure: STENT REMOVAL;  Surgeon: [name]., MD;  Location: [facility];  Service: Gastroenterology;;   Lavell Islam REMOVAL  09/30/2020   Procedure: [place];  Surgeon: [name]., MD;  Location: [facility];  Service: Gastroenterology;;   Current Facility-Administered Medications  Medication Dose Route Frequency Provider Last Rate Last Admin   0.9 %  sodium chloride infusion   Intravenous Continuous Mansouraty, Telford Nab., MD       lactated ringers infusion   Intravenous Continuous Mansouraty, Telford Nab., MD       lactated ringers infusion    Continuous  PRN Mansouraty, Telford Nab., MD 10 mL/hr at 06/24/21 0718 1,000 mL at 06/24/21 2423   Facility-Administered Medications Ordered in Other Encounters  Medication Dose Route Frequency Provider Last Rate Last Admin   lactated ringers infusion   Intravenous Continuous PRN Deliah Boston, CRNA   New Bag at 06/24/21 5361   midazolam (VERSED) 5 MG/5ML injection   Intravenous Anesthesia Intra-op Cleda Daub, CRNA   2 mg at 02/24/21 1258    Current Facility-Administered Medications:    0.9 %  sodium chloride infusion, , Intravenous, Continuous, Mansouraty, Telford Nab., MD   lactated ringers infusion, , Intravenous, Continuous, Mansouraty, Telford Nab., MD   lactated ringers infusion, , , Continuous PRN, Mansouraty, Telford Nab., MD, Last Rate: 10 mL/hr at 06/24/21 0718, 1,000 mL at 06/24/21 4431  Facility-Administered Medications Ordered in Other Encounters:    lactated ringers infusion, , Intravenous, Continuous PRN, Deliah Boston, CRNA, New Bag at 06/24/21 5400   midazolam (VERSED) 5 MG/5ML injection, , Intravenous, Anesthesia Intra-op, Cleda Daub, CRNA, 2 mg at 02/24/21 1258 Allergies  Allergen Reactions   Dilaudid [Hydromorphone] Hives   Talwin [Pentazocine]     Turned blue around lips and face, rash all over body   Family History  Problem Relation Age of Onset   Diabetes Mother    Heart failure Mother    Depression Mother    Colon cancer Neg Hx    Stomach cancer Neg Hx    Esophageal cancer Neg Hx    Pancreatic cancer Neg Hx    Colon polyps Neg Hx    Rectal cancer Neg Hx    Social History   Socioeconomic History   Marital status: Married    Spouse name: Not on file   Number of children: Not on file   Years of education: Not on file   Highest education level: Not on file  Occupational History   Not on file  Tobacco Use   Smoking status: Never   Smokeless tobacco: Never  Vaping Use   Vaping Use: Never used  Substance and Sexual Activity   Alcohol use: Never   Drug  use: Never   Sexual activity: Not Currently  Other Topics Concern   Not on file  Social History Narrative   Lives with niece and husband   Right handed   Drinks 4-5 cups daily   Social Determinants of Health   Financial Resource Strain: Not on file  Food Insecurity: Not on file  Transportation Needs: Not on file  Physical Activity: Not on file  Stress: Not on file  Social Connections: Not on file  Intimate Partner Violence: Not on file    Physical Exam: Today's Vitals   06/24/21 0659  BP: (!) 99/52  Pulse: 86  Resp: 18  Temp: 98.5 F (36.9 C)  TempSrc: Oral  Weight: 86.2 kg  Height: 5' 5"  (1.651 m)   Body mass index is  31.62 kg/m. GEN: NAD EYE: Sclerae anicteric ENT: MMM CV: Non-tachycardic GI: Soft, minimal TTP in MEG NEURO:  Alert & Oriented x 3  Lab Results: No results for input(s): WBC, HGB, HCT, PLT in the last 72 hours. BMET No results for input(s): NA, K, CL, CO2, GLUCOSE, BUN, CREATININE, CALCIUM in the last 72 hours. LFT No results for input(s): PROT, ALBUMIN, AST, ALT, ALKPHOS, BILITOT, BILIDIR, IBILI in the last 72 hours. PT/INR No results for input(s): LABPROT, INR in the last 72 hours.   Impression / Plan: This is a 56 y.o.female who presents for ERCP for biliary stent removal/exchange for previous biliary leak - hopeful it is not an issue anymore.  The risks of an ERCP were discussed at length, including but not limited to the risk of perforation, bleeding, abdominal pain, post-ERCP pancreatitis (while usually mild can be severe and even life threatening).   The risks and benefits of endoscopic evaluation/treatment were discussed with the patient and/or family; these include but are not limited to the risk of perforation, infection, bleeding, missed lesions, lack of diagnosis, severe illness requiring hospitalization, as well as anesthesia and sedation related illnesses.  The patient's history has been reviewed, patient examined, no change in  status, and deemed stable for procedure.  The patient and/or family is agreeable to proceed.    Justice Britain, MD [facility] Office # [phone]

## 2021-06-24 NOTE — Op Note (Signed)
[facility] Patient Name: Kim Hicks Procedure Date: 06/24/2021 MRN: [id number] Attending MD: Justice Britain , MD Date of Birth: 07/20/1965 CSN: [id number] Age: 56 Admit Type: Outpatient Procedure:                ERCP Indications:              Bile leak, Epigastric abdominal pain, Biliary stent                            removal, Postop exam: Cholecystectomy, Prior                            Endoscopic Retrograde Cholangiopancreatography Providers:                Justice Britain, MD Referring MD:             Pricilla Riffle. Fuller Plan, MD, [name], [place] Medicines:                General Anesthesia, Cipro 400 mg IV, Indomethacin                            937 mg PR Complications:            No immediate complications. Estimated Blood Loss:     Estimated blood loss was minimal. Procedure:                Pre-Anesthesia Assessment:                           - Prior to the procedure, a History and Physical                            was performed, and patient medications and                            allergies were reviewed. The patient's tolerance of                            previous anesthesia was also reviewed. The risks                            and benefits of the procedure and the sedation                            options and risks were discussed with the patient.                            All questions were answered, and informed consent                            was obtained. Prior Anticoagulants: The patient has                            taken no previous anticoagulant or antiplatelet  agents. ASA Grade Assessment: III - A patient with                            severe systemic disease. After reviewing the risks                            and benefits, the patient was deemed in                            satisfactory condition to undergo the procedure.                           After obtaining informed consent, the  scope was                            passed under direct vision. Throughout the                            procedure, the patient's blood pressure, pulse, and                            oxygen saturations were monitored continuously. The                            [id number]-Q190V ([id number] Olympus duodenoscope was                            introduced through the mouth, and used to inject                            contrast into and used to inject contrast into the                            bile duct. The ERCP was accomplished without                            difficulty. The patient tolerated the procedure. Scope In: Scope Out: Findings:      A scout film of the abdomen was obtained. Surgical clips, consistent       with previous cholecystectomy, were seen in the area of the right upper       quadrant of the abdomen. One stent ending in the main bile duct was seen.      The esophagus was successfully intubated under direct vision without       detailed examination of the pharynx, larynx, and associated structures,       and upper GI tract. A biliary sphincterotomy had been performed. The       sphincterotomy appeared open. The major papilla was partially ulcerated       from the previous biliary stent that was in place. The plastic biliary       stent originating in the biliary tree was emerging from the major       papilla and was removed from the biliary tree using a snare.      A short 0.035 inch Soft Jagwire was passed into  the biliary tree. The       Hydratome sphincterotome was passed over the guidewire and the bile duct       was then deeply cannulated. Contrast was injected. I personally       interpreted the bile duct images. Ductal flow of contrast was adequate.       Image quality was adequate. Contrast extended to the hepatic ducts.       Opacification of the entire biliary tree was successful. The lower third       of the main bile duct contained a filling defect thought to be  sludge.       The lower third of the main bile duct was mildly dilated. The largest       diameter was 10 mm. The middle third of the main bile duct, upper third       of the main bile duct and left and right hepatic ducts and all       intrahepatic branches were normal. There was no extravasation of       contrast noted, but there was evidence of the previously performed       subtotal cholecystectomy as it filled but no evidence of overt       extravasation noted even after multiple fills and monitoring for       minutes. To discover objects, the biliary tree was swept with a       retrieval balloon. Sludge was swept from the duct. An occlusion       cholangiogram was performed that showed no further significant biliary       pathology other than what was noted above. Opacification of the cystic       duct and gallbladder was seen again but no evidence of leak.      A pancreatogram was not performed.      The duodenoscope was withdrawn from the patient. Impression:               - Prior biliary sphincterotomy appeared open. The                            major papilla appeared to be ulcerated due to                            previous stent being in place. This stent was                            removed.                           - The fluoroscopic examination was suspicious for                            sludge.                           - The lower third of the main bile duct was mildly                            dilated but otherwise more proximal normal biliary  tree.                           - The biliary tree was swept and sludge was found.                           - Evidence of subtotal cholecystectomy is noted but                            overt extravasation is no longer present at this                            time. Moderate Sedation:      Not Applicable - Patient had care per Anesthesia. Recommendation:           - The patient will be  observed post-procedure,                            until all discharge criteria are met.                           - Discharge patient to home.                           - Patient has a contact number available for                            emergencies. The signs and symptoms of potential                            delayed complications were discussed with the                            patient. Return to normal activities tomorrow.                            Written discharge instructions were provided to the                            patient.                           - Low fat diet.                           - Watch for pancreatitis, bleeding, perforation,                            and cholangitis.                           - Ciprofloxacin 500 mg twice daily for 3-days to                            decrease risk of post-infectious complications.                           -  LFTs in 1-2 weeks.                           - Proceed with a CT-Abdomen within the next few                            weeks to evaluate how the biliary tree and partial                            cholecystectomy are insitu.                           - If patient develops significant abdominal                            pain/fevers/chills, she should reach out to our                            office so that labs and expedited CT scan can be                            obtained to ensure no other issues have developed.                           - Patient will follow up with Dr. Fuller Plan for further                            discussion of BRBPR, based on clinical history most                            likely hemorrhoidal in nature (I am prescribing                            Anusol suppositories for initial treatment at this                            time).                           - The findings and recommendations were discussed                            with the patient.                           - The  findings and recommendations were discussed                            with the patient's family. Procedure Code(s):        --- Professional ---                           [phone], Endoscopic retrograde  cholangiopancreatography (ERCP); with removal of                            foreign body(s) or stent(s) from biliary/pancreatic                            duct(s)                           43264, Endoscopic retrograde                            cholangiopancreatography (ERCP); with removal of                            calculi/debris from biliary/pancreatic duct(s) Diagnosis Code(s):        --- Professional ---                           K83.8, Other specified diseases of biliary tract                           Z96.89, Presence of other specified functional                            implants                           Z46.59, Encounter for fitting and adjustment of                            other gastrointestinal appliance and device                           R10.13, Epigastric pain                           Z09, Encounter for follow-up examination after                            completed treatment for conditions other than                            malignant neoplasm                           Z90.49, Acquired absence of other specified parts                            of digestive tract                           Z98.890, Other specified postprocedural states CPT copyright 2019 [employer] Medical [employer]. All rights reserved. The codes documented in this report are preliminary and upon coder review may  be revised to meet current compliance requirements. Justice Britain, MD 06/24/2021 8:51:28 AM Number of Addenda: 0

## 2021-06-24 NOTE — Anesthesia Procedure Notes (Signed)
Procedure Name: Intubation Date/Time: 06/24/2021 7:45 AM Performed by: Deliah Boston, CRNA Pre-anesthesia Checklist: Patient identified, Emergency Drugs available, Suction available and Patient being monitored Patient Re-evaluated:Patient Re-evaluated prior to induction Oxygen Delivery Method: Circle system utilized Preoxygenation: Pre-oxygenation with 100% oxygen Induction Type: IV induction Ventilation: Mask ventilation without difficulty Laryngoscope Size: Mac and 3 Grade View: Grade I Tube type: Oral Tube size: 7.0 mm Number of attempts: 1 Airway Equipment and Method: Stylet and Oral airway Placement Confirmation: ETT inserted through vocal cords under direct vision, positive ETCO2 and breath sounds checked- equal and bilateral Secured at: 20 cm Tube secured with: Tape Dental Injury: Teeth and Oropharynx as per pre-operative assessment

## 2021-06-25 ENCOUNTER — Encounter (HOSPITAL_COMMUNITY): Payer: Self-pay | Admitting: Gastroenterology

## 2021-06-25 ENCOUNTER — Telehealth: Payer: Self-pay

## 2021-06-25 NOTE — Telephone Encounter (Signed)
Scheduled patient for appt with Dr. Fuller Plan on 07/16/21 at 10:50 am. Left message for patient to return my call.

## 2021-06-25 NOTE — Telephone Encounter (Signed)
-----   Message from [name], MD sent at 06/24/2021  8:27 AM EST ----- Regarding: RE: Follow up GM, I will see her in the office in the near future regarding hematochezia - suspected hemorrhoidal bleeding based on 2022 colonoscopy findings.  Estill Bamberg, please schedule an REV with me or an APP.  Thanks, MS  ----- Message ----- From: [name]., MD Sent: 06/24/2021   7:39 AM EST To: [name], MD Subject: Follow up                                      MS, Before I forget, this patient is getting her ERCP later today to see how things have done with her prior biliary leak and stent.   She has been experieincing some increased amount of BRBPR after her Bms. I will give her some suppositories and Hemorrhoid treatments at discharge, but may benefit from seeing you back in clinic to discuss things further, and also question of whether repeat colonoscopy should wait until 2-years or if she may want to get that done sooner.  Do not suspect AVMs playing a role this go around. I'll update after the ERCP. Thanks. GM

## 2021-06-25 NOTE — Addendum Note (Signed)
Addended by: Marlon Pel on: 06/25/2021 11:53 AM   Modules accepted: Orders

## 2021-06-25 NOTE — Telephone Encounter (Signed)
Left message for patient to call back CT order sent to [facility]

## 2021-06-27 ENCOUNTER — Encounter: Payer: Self-pay | Admitting: Sports Medicine

## 2021-06-27 ENCOUNTER — Ambulatory Visit (INDEPENDENT_AMBULATORY_CARE_PROVIDER_SITE_OTHER): Payer: Medicare Other | Admitting: Sports Medicine

## 2021-06-27 DIAGNOSIS — G8929 Other chronic pain: Secondary | ICD-10-CM

## 2021-06-27 DIAGNOSIS — S92062K Displaced intraarticular fracture of left calcaneus, subsequent encounter for fracture with nonunion: Secondary | ICD-10-CM

## 2021-06-27 DIAGNOSIS — G5772 Causalgia of left lower limb: Secondary | ICD-10-CM

## 2021-06-27 DIAGNOSIS — M79672 Pain in left foot: Secondary | ICD-10-CM | POA: Diagnosis not present

## 2021-06-27 DIAGNOSIS — M25572 Pain in left ankle and joints of left foot: Secondary | ICD-10-CM

## 2021-06-27 MED ORDER — OXYCODONE-ACETAMINOPHEN 10-325 MG PO TABS: 1.0000 | ORAL_TABLET | Freq: Three times a day (TID) | ORAL | 0 refills | Status: AC | PRN

## 2021-06-27 MED ORDER — CYCLOBENZAPRINE HCL 10 MG PO TABS: 10.0000 mg | ORAL_TABLET | Freq: Three times a day (TID) | ORAL | 0 refills | Status: AC | PRN

## 2021-06-27 NOTE — Progress Notes (Signed)
StatesSubjective: Kim Hicks is a 56 y.o. female patient who returns to office for follow evaluation of Left foot pain secondary to fracture injury.  Patient reports increased sensitivity and has not been able to use the neurostimulator for the last 3 days because her To the wire of the stimulator that the company told her that she would have to pay $300 to have it repaired which she states that she is not going to pain.  Patient reports that there is a lot of intense pain to the top of the foot with swelling cannot move the toes as well as intense pain firing like at the ankle. Admit that when she puts her toes down on the floor there is a sharp pain that radiates up her ankle.  Patient denies any other pedal complaints at this time.  Patient Active Problem List   Diagnosis Date Noted   RLS (restless legs syndrome) 02/17/2021   Anemia 12/03/2020   Benign neoplasm of rectum    AVM (arteriovenous malformation) of colon, acquired    Portal hypertensive gastropathy ([place])    On home oxygen therapy 11/05/2020   Biliary drain displacement 11/01/2020   Iron deficiency anemia 10/03/2020   Common migraine with intractable migraine 02/14/3006   Metabolic encephalopathy    Septic shock ([place]) 07/06/2020   Acute respiratory failure with hypoxemia ([place]) 07/06/2020   Pressure injury of skin 07/04/2020   Post-ERCP acute pancreatitis 06/27/2020   Pancreatitis 06/27/2020   Abscess of gallbladder    Migration of biliary stent    Malnutrition of moderate degree 05/23/2020   Abdominal pain 05/21/2020   Biloma 05/20/2020   Biliary sludge    Complicated UTI (urinary tract infection) 03/11/2020   Encounter for removal of biliary stent    Diabetic peripheral neuropathy ([place]) 09/14/2019   Gait disturbance 09/14/2019   NASH (nonalcoholic steatohepatitis) 08/21/2019   Bile leak    Other cirrhosis of liver ([place]) 08/04/2019   Major depressive disorder, recurrent episode, severe, with psychotic behavior ([place])  03/25/2011   DYSPHAGIA UNSPECIFIED 03/04/2009   GERD 02/18/2009   PERSISTENT VOMITING 02/18/2009   CHEST PAIN 02/18/2009   NAUSEA AND VOMITING 02/18/2009   Dehydration 07/19/2007   GASTROENTERITIS 07/19/2007   FIBROIDS, UTERUS 05/02/2007   Depression 05/02/2007   MIGRAINE HEADACHE 05/02/2007   Hypertension 05/02/2007   HEMORRHOIDS 05/02/2007   Chronic constipation 05/02/2007   IBS 05/02/2007   PSORIASIS 05/02/2007   PITUITARY NEOPLASM, HX OF 05/02/2007   LIVER FUNCTION TESTS, ABNORMAL, HX OF 05/02/2007   Diabetes mellitus without complication ([place]) 62/26/3335   Hyperlipidemia associated with type 2 diabetes mellitus ([place]) 02/04/2007   SINUSITIS, ACUTE 02/04/2007   Abdominal pain, right upper quadrant 02/04/2007   Hyperlipidemia 02/04/2007    Current Outpatient Medications on File Prior to Visit  Medication Sig Dispense Refill   ALPRAZolam (XANAX) 0.5 MG tablet Take 0.5 mg by mouth 3 (three) times daily.     Calcium Carbonate-Vit D-Min (CALCIUM 1200) 1200-1000 MG-UNIT CHEW Chew 1 tablet by mouth daily.     ciprofloxacin (CIPRO) 500 MG tablet Take 1 tablet (500 mg total) by mouth 2 (two) times daily for 3 days. 6 tablet 0   diltiazem (CARDIZEM CD) 120 MG 24 hr capsule Take 120 mg by mouth daily.     diltiazem (DILACOR XR) 120 MG 24 hr capsule Take 120 mg by mouth daily.     docusate sodium (COLACE) 100 MG capsule Take 100 mg by mouth 2 (two) times daily.  Dulaglutide (TRULICITY) 4.5 RC/7.8LF SOPN Inject 4.5 mg into the skin every Monday.     DULoxetine (CYMBALTA) 30 MG capsule Take 30 mg by mouth at bedtime.     Empagliflozin-metFORMIN HCl ER (SYNJARDY XR) 25-1000 MG TB24 Take 1 tablet by mouth daily.     hydrocortisone (ANUSOL-HC) 25 MG suppository Place 1 suppository (25 mg total) rectally every 12 (twelve) hours. Nightly for 1 week and then every other night until prescription is complete. 12 suppository 1   Insulin Glargine (BASAGLAR KWIKPEN) 100 UNIT/ML Inject 30 Units into  the skin at bedtime.     lactulose (CHRONULAC) 10 GM/15ML solution Take 15 mLs (10 g total) by mouth 3 (three) times daily. 900 mL 1   LINZESS 290 MCG CAPS capsule TAKE 1 CAPSULE BY MOUTH DAILY BEFORE BREAKFAST. 90 capsule 1   lisinopril (ZESTRIL) 40 MG tablet Take 40 mg by mouth every morning.     metFORMIN (GLUCOPHAGE) 1000 MG tablet Take 1,000 mg by mouth at bedtime.     ondansetron (ZOFRAN) 4 MG tablet Take 1 tablet (4 mg total) by mouth every 8 (eight) hours as needed for nausea or vomiting. To use prior to bone stimulator treatment 20 tablet 0   pantoprazole (PROTONIX) 20 MG tablet Take 20 mg by mouth daily.     PAXLOVID, 300/100, 20 x 150 MG & 10 x 100MG TBPK Take 3 tablets by mouth 2 (two) times daily.     pregabalin (LYRICA) 100 MG capsule Two capsules in the morning and evening, one at midday (Patient taking differently: Take 100-200 mg by mouth See admin instructions. Take 200 mg in the morning, 100 mg at lunch, and 200 mg at bedtime) 450 capsule 1   QUEtiapine (SEROQUEL) 400 MG tablet Take 2 tablets (800 mg total) by mouth at bedtime.     QUEtiapine (SEROQUEL) 50 MG tablet Take 100 mg by mouth every morning.     simvastatin (ZOCOR) 10 MG tablet Take 10 mg by mouth daily.  6   topiramate (TOPAMAX) 50 MG tablet Take 50 mg by mouth 2 (two) times daily.     traMADol ([name]) 50 MG tablet Take 50 mg by mouth every 12 (twelve) hours as needed for severe pain.     vortioxetine HBr ([name]) 20 MG TABS tablet Take 20 mg by mouth at bedtime.     Current Facility-Administered Medications on File Prior to Visit  Medication Dose Route Frequency Provider Last Rate Last Admin   midazolam (VERSED) 5 MG/5ML injection   Intravenous Anesthesia Intra-op Cleda Daub, CRNA   2 mg at 02/24/21 1258    Allergies  Allergen Reactions   Dilaudid [Hydromorphone] Hives   Talwin [Pentazocine]     Turned blue around lips and face, rash all over body    Objective:  General: Alert and oriented x3 in no  acute distress  Dermatology: No open lesions bilateral lower extremities, no webspace macerations, no ecchymosis bilateral, all nails x 10 are well manicured.  Vascular: Minimal trace edema to the left foot and dorsal lateral ankle, Dorsalis Pedis and Posterior Tibial pedal pulses 1/4, Capillary Fill Time 5 seconds,(+) pedal hair growth bilateral, Temperature gradient within normal limits.  Neurology: Gross sensation intact via light touch bilateral, Subjective sharp shooting pains to left  foot and ankle.   Musculoskeletal: There is tenderness with palpation diffusely over the entire left foot and ankle with most pain dorsolateral at the sinus tarsi and anterior calcaneus. Guarding due to pain on left with increased  hypersensitivity to this area as previously noted and to the top of the foot.  Subjective limited range of motion at metatarsophalangeal joints.  There is mild guarding due to pain.  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 -We will discontinue bone stimulator at this time since cat chewed through the wire and she can no longer  use it and cannot afford to pay $300 for it -[employer] boot covered with web roll and Ace wrap to the left lower extremity and advised patient to keep clean dry and intact until next office visit -Refilled Percocet and Flexeril -Recommend protection, rest, heat and ice as directed, elevation daily until symptoms improve -Advised patient to strictly stay off of foot -Return to office in 2 weeks for Unna boot change and x-rays or sooner if problems or issues arise  Landis Martins, DPM

## 2021-06-27 NOTE — Telephone Encounter (Signed)
This was a very straightforward procedure.  I would not normally expect her to have any pain or any issues post procedurally.  There was no evidence of an overt bile leak.  She has tended towards having issues post procedurally in the past however.  So we must consider postprocedural complications still.  This what I would recommend if the patient wants further evaluation: 1) laboratory evaluation CBC/CMP/amylase/lipase to be obtained 2) stat KUB 2 view to ensure no evidence of free air/perforation 3) if she decides to just monitor how she is doing and continue on bland diet that is fine, if symptoms are persisting into next week when our office calls her back, then she will need to have the previous recommendations completed  Thanks. GM

## 2021-06-27 NOTE — Telephone Encounter (Signed)
Spoke with pt regarding recommendations, and she would prefer to continue with bland diet for the weekend before she comes into the office. I will call pt on Monday to see how she is feeling.

## 2021-06-27 NOTE — Telephone Encounter (Signed)
See other phone note. Pt is aware of appt.

## 2021-06-27 NOTE — Telephone Encounter (Signed)
Patient notified of the need for CT she has been scheduled for 3/9.  She is also aware of the appointment with Dr. Fuller Plan on 2/22 for her reported hematochezia  She c/o left sided abdominal pain since her ERCP on 1/31.  "My left side is killing me, especially under my ribs". Pain is constant, but worse with movement, she reports nausea but no vomiting.  She is tolerating a bland diet. She denies fever, diarrhea, or constipation.

## 2021-06-27 NOTE — Telephone Encounter (Signed)
Left message for patient to call back  

## 2021-06-30 ENCOUNTER — Other Ambulatory Visit (INDEPENDENT_AMBULATORY_CARE_PROVIDER_SITE_OTHER): Payer: Medicare Other

## 2021-06-30 ENCOUNTER — Ambulatory Visit (INDEPENDENT_AMBULATORY_CARE_PROVIDER_SITE_OTHER)
Admission: RE | Admit: 2021-06-30 | Discharge: 2021-06-30 | Disposition: A | Discharge status: Home / Self Care | Payer: Medicare Other | Source: Ambulatory Visit | Attending: Gastroenterology | Admitting: Gastroenterology

## 2021-06-30 ENCOUNTER — Other Ambulatory Visit: Payer: Self-pay | Admitting: Gastroenterology

## 2021-06-30 ENCOUNTER — Other Ambulatory Visit: Payer: Self-pay

## 2021-06-30 ENCOUNTER — Telehealth: Payer: Self-pay

## 2021-06-30 DIAGNOSIS — R1012 Left upper quadrant pain: Secondary | ICD-10-CM

## 2021-06-30 DIAGNOSIS — R1032 Left lower quadrant pain: Secondary | ICD-10-CM

## 2021-06-30 LAB — COMPREHENSIVE METABOLIC PANEL
ALT: 27 U/L (ref 0–35)
AST: 33 U/L (ref 0–37)
Albumin: 4.3 g/dL (ref 3.5–5.2)
Alkaline Phosphatase: 167 U/L — ABNORMAL HIGH (ref 39–117)
BUN: 27 mg/dL — ABNORMAL HIGH (ref 6–23)
CO2: 24 mEq/L (ref 19–32)
Calcium: 9.2 mg/dL (ref 8.4–10.5)
Chloride: 103 mEq/L (ref 96–112)
Creatinine, Ser: 0.75 mg/dL (ref 0.40–1.20)
GFR: 88.9 mL/min (ref >60.00)
Glucose, Bld: 310 mg/dL — ABNORMAL HIGH (ref 70–99)
Potassium: 5.2 mEq/L — ABNORMAL HIGH (ref 3.5–5.1)
Sodium: 135 mEq/L (ref 135–145)
Total Bilirubin: 0.3 mg/dL (ref 0.2–1.2)
Total Protein: 7.7 g/dL (ref 6.0–8.3)

## 2021-06-30 LAB — CBC WITH DIFFERENTIAL/PLATELET
Basophils Absolute: 0 10*3/uL (ref 0.0–0.1)
Basophils Relative: 0.4 % (ref 0.0–3.0)
Eosinophils Absolute: 0.3 10*3/uL (ref 0.0–0.7)
Eosinophils Relative: 4.3 % (ref 0.0–5.0)
HCT: 37.8 % (ref 36.0–46.0)
Hemoglobin: 12 g/dL (ref 12.0–15.0)
Lymphocytes Relative: 22.3 % (ref 12.0–46.0)
Lymphs Abs: 1.3 10*3/uL (ref 0.7–4.0)
MCHC: 31.8 g/dL (ref 30.0–36.0)
MCV: 86.9 fl (ref 78.0–100.0)
Monocytes Absolute: 0.4 10*3/uL (ref 0.1–1.0)
Monocytes Relative: 7 % (ref 3.0–12.0)
Neutro Abs: 3.9 10*3/uL (ref 1.4–7.7)
Neutrophils Relative %: 66 % (ref 43.0–77.0)
Platelets: 156 10*3/uL (ref 150.0–400.0)
RBC: 4.35 Mil/uL (ref 3.87–5.11)
RDW: 15.6 % — ABNORMAL HIGH (ref 11.5–15.5)
WBC: 6 10*3/uL (ref 4.0–10.5)

## 2021-06-30 LAB — LIPASE: Lipase: 31 U/L (ref 11.0–59.0)

## 2021-06-30 LAB — AMYLASE: Amylase: 23 U/L — ABNORMAL LOW (ref 27–131)

## 2021-06-30 NOTE — Progress Notes (Signed)
Spoke with patient this morning to see how she was feeling since continuing bland diet over the weekend, and she is still stating that she is having pain & nausea. Labs and KUB 2 view ordered per GM on Friday if symptoms were unrelieved after the weekend. Pt is aware that she can come in at anytime today between 7:30am-5pm.

## 2021-06-30 NOTE — Telephone Encounter (Signed)
-----   Message from Carl Best, RN sent at 06/27/2021 10:27 AM EST ----- Call and check to see how pt is feeling since continuing bland diet over the weekend.

## 2021-06-30 NOTE — Telephone Encounter (Signed)
Unable to reach pt, left vm to call back. °

## 2021-06-30 NOTE — Progress Notes (Signed)
Thank you for update. Agree with her following up. Please make sure that the labs and the imaging are stat orders so we can get some results today. Thanks. GM

## 2021-07-01 ENCOUNTER — Telehealth: Payer: Self-pay | Admitting: Sports Medicine

## 2021-07-01 NOTE — Telephone Encounter (Signed)
Pt notified and understands/reb

## 2021-07-01 NOTE — Telephone Encounter (Signed)
Pt states she is having an issue with foot twisting and more pain than usual since she was put in a soft cast.  Would like to know what she can do.

## 2021-07-06 ENCOUNTER — Emergency Department: Payer: Medicare Other

## 2021-07-06 ENCOUNTER — Other Ambulatory Visit: Payer: Self-pay

## 2021-07-06 ENCOUNTER — Inpatient Hospital Stay: Payer: Medicare Other

## 2021-07-06 ENCOUNTER — Inpatient Hospital Stay
Admission: EM | Admit: 2021-07-06 | Discharge: 2021-07-23 | DRG: 871 | Disposition: E | Payer: Medicare Other | Attending: Pulmonary Disease | Admitting: Pulmonary Disease

## 2021-07-06 ENCOUNTER — Encounter: Payer: Self-pay | Admitting: Emergency Medicine

## 2021-07-06 DIAGNOSIS — R570 Cardiogenic shock: Secondary | ICD-10-CM | POA: Diagnosis present

## 2021-07-06 DIAGNOSIS — K5931 Toxic megacolon: Secondary | ICD-10-CM | POA: Diagnosis present

## 2021-07-06 DIAGNOSIS — Z885 Allergy status to narcotic agent status: Secondary | ICD-10-CM

## 2021-07-06 DIAGNOSIS — X58XXXA Exposure to other specified factors, initial encounter: Secondary | ICD-10-CM | POA: Diagnosis present

## 2021-07-06 DIAGNOSIS — A419 Sepsis, unspecified organism: Principal | ICD-10-CM | POA: Diagnosis present

## 2021-07-06 DIAGNOSIS — G9340 Encephalopathy, unspecified: Secondary | ICD-10-CM | POA: Diagnosis not present

## 2021-07-06 DIAGNOSIS — J9811 Atelectasis: Secondary | ICD-10-CM | POA: Diagnosis present

## 2021-07-06 DIAGNOSIS — Z9842 Cataract extraction status, left eye: Secondary | ICD-10-CM

## 2021-07-06 DIAGNOSIS — E669 Obesity, unspecified: Secondary | ICD-10-CM | POA: Diagnosis present

## 2021-07-06 DIAGNOSIS — I248 Other forms of acute ischemic heart disease: Secondary | ICD-10-CM | POA: Diagnosis present

## 2021-07-06 DIAGNOSIS — R7401 Elevation of levels of liver transaminase levels: Secondary | ICD-10-CM

## 2021-07-06 DIAGNOSIS — Z9911 Dependence on respirator [ventilator] status: Secondary | ICD-10-CM | POA: Diagnosis not present

## 2021-07-06 DIAGNOSIS — K7581 Nonalcoholic steatohepatitis (NASH): Secondary | ICD-10-CM | POA: Diagnosis present

## 2021-07-06 DIAGNOSIS — R569 Unspecified convulsions: Secondary | ICD-10-CM | POA: Diagnosis not present

## 2021-07-06 DIAGNOSIS — G2581 Restless legs syndrome: Secondary | ICD-10-CM | POA: Diagnosis present

## 2021-07-06 DIAGNOSIS — Z79899 Other long term (current) drug therapy: Secondary | ICD-10-CM

## 2021-07-06 DIAGNOSIS — K7201 Acute and subacute hepatic failure with coma: Secondary | ICD-10-CM | POA: Diagnosis not present

## 2021-07-06 DIAGNOSIS — J9602 Acute respiratory failure with hypercapnia: Secondary | ICD-10-CM | POA: Diagnosis present

## 2021-07-06 DIAGNOSIS — Z515 Encounter for palliative care: Secondary | ICD-10-CM | POA: Diagnosis not present

## 2021-07-06 DIAGNOSIS — E1142 Type 2 diabetes mellitus with diabetic polyneuropathy: Secondary | ICD-10-CM | POA: Diagnosis present

## 2021-07-06 DIAGNOSIS — J690 Pneumonitis due to inhalation of food and vomit: Secondary | ICD-10-CM | POA: Diagnosis present

## 2021-07-06 DIAGNOSIS — N179 Acute kidney failure, unspecified: Secondary | ICD-10-CM | POA: Diagnosis present

## 2021-07-06 DIAGNOSIS — Z7984 Long term (current) use of oral hypoglycemic drugs: Secondary | ICD-10-CM

## 2021-07-06 DIAGNOSIS — R6521 Severe sepsis with septic shock: Secondary | ICD-10-CM | POA: Diagnosis present

## 2021-07-06 DIAGNOSIS — G253 Myoclonus: Secondary | ICD-10-CM | POA: Diagnosis not present

## 2021-07-06 DIAGNOSIS — S2243XA Multiple fractures of ribs, bilateral, initial encounter for closed fracture: Secondary | ICD-10-CM | POA: Diagnosis present

## 2021-07-06 DIAGNOSIS — Z888 Allergy status to other drugs, medicaments and biological substances status: Secondary | ICD-10-CM

## 2021-07-06 DIAGNOSIS — S2220XA Unspecified fracture of sternum, initial encounter for closed fracture: Secondary | ICD-10-CM | POA: Diagnosis present

## 2021-07-06 DIAGNOSIS — I469 Cardiac arrest, cause unspecified: Secondary | ICD-10-CM

## 2021-07-06 DIAGNOSIS — G928 Other toxic encephalopathy: Secondary | ICD-10-CM | POA: Diagnosis present

## 2021-07-06 DIAGNOSIS — E785 Hyperlipidemia, unspecified: Secondary | ICD-10-CM | POA: Diagnosis present

## 2021-07-06 DIAGNOSIS — D509 Iron deficiency anemia, unspecified: Secondary | ICD-10-CM | POA: Diagnosis present

## 2021-07-06 DIAGNOSIS — G931 Anoxic brain damage, not elsewhere classified: Secondary | ICD-10-CM | POA: Diagnosis present

## 2021-07-06 DIAGNOSIS — Z7985 Long-term (current) use of injectable non-insulin antidiabetic drugs: Secondary | ICD-10-CM

## 2021-07-06 DIAGNOSIS — K589 Irritable bowel syndrome without diarrhea: Secondary | ICD-10-CM | POA: Diagnosis present

## 2021-07-06 DIAGNOSIS — Z20822 Contact with and (suspected) exposure to covid-19: Secondary | ICD-10-CM | POA: Diagnosis present

## 2021-07-06 DIAGNOSIS — Z7189 Other specified counseling: Secondary | ICD-10-CM | POA: Diagnosis not present

## 2021-07-06 DIAGNOSIS — Z8249 Family history of ischemic heart disease and other diseases of the circulatory system: Secondary | ICD-10-CM

## 2021-07-06 DIAGNOSIS — R402444 Other coma, without documented Glasgow coma scale score, or with partial score reported, 24 hours or more after hospital admission: Secondary | ICD-10-CM | POA: Diagnosis not present

## 2021-07-06 DIAGNOSIS — E1111 Type 2 diabetes mellitus with ketoacidosis with coma: Secondary | ICD-10-CM

## 2021-07-06 DIAGNOSIS — I10 Essential (primary) hypertension: Secondary | ICD-10-CM | POA: Diagnosis present

## 2021-07-06 DIAGNOSIS — Z978 Presence of other specified devices: Secondary | ICD-10-CM

## 2021-07-06 DIAGNOSIS — Z66 Do not resuscitate: Secondary | ICD-10-CM | POA: Diagnosis not present

## 2021-07-06 DIAGNOSIS — Z9841 Cataract extraction status, right eye: Secondary | ICD-10-CM

## 2021-07-06 DIAGNOSIS — F419 Anxiety disorder, unspecified: Secondary | ICD-10-CM | POA: Diagnosis present

## 2021-07-06 DIAGNOSIS — K7469 Other cirrhosis of liver: Secondary | ICD-10-CM | POA: Diagnosis present

## 2021-07-06 DIAGNOSIS — Z90710 Acquired absence of both cervix and uterus: Secondary | ICD-10-CM

## 2021-07-06 DIAGNOSIS — F32A Depression, unspecified: Secondary | ICD-10-CM | POA: Diagnosis present

## 2021-07-06 DIAGNOSIS — K8020 Calculus of gallbladder without cholecystitis without obstruction: Secondary | ICD-10-CM | POA: Diagnosis present

## 2021-07-06 DIAGNOSIS — R748 Abnormal levels of other serum enzymes: Secondary | ICD-10-CM | POA: Diagnosis present

## 2021-07-06 DIAGNOSIS — J9601 Acute respiratory failure with hypoxia: Secondary | ICD-10-CM | POA: Diagnosis present

## 2021-07-06 DIAGNOSIS — K5909 Other constipation: Secondary | ICD-10-CM | POA: Diagnosis present

## 2021-07-06 DIAGNOSIS — Z961 Presence of intraocular lens: Secondary | ICD-10-CM | POA: Diagnosis present

## 2021-07-06 DIAGNOSIS — Z452 Encounter for adjustment and management of vascular access device: Secondary | ICD-10-CM

## 2021-07-06 DIAGNOSIS — Z794 Long term (current) use of insulin: Secondary | ICD-10-CM

## 2021-07-06 DIAGNOSIS — K567 Ileus, unspecified: Secondary | ICD-10-CM

## 2021-07-06 DIAGNOSIS — Z833 Family history of diabetes mellitus: Secondary | ICD-10-CM

## 2021-07-06 DIAGNOSIS — R7989 Other specified abnormal findings of blood chemistry: Secondary | ICD-10-CM | POA: Diagnosis not present

## 2021-07-06 DIAGNOSIS — Z6838 Body mass index (BMI) 38.0-38.9, adult: Secondary | ICD-10-CM

## 2021-07-06 DIAGNOSIS — Z818 Family history of other mental and behavioral disorders: Secondary | ICD-10-CM

## 2021-07-06 DIAGNOSIS — R4182 Altered mental status, unspecified: Secondary | ICD-10-CM | POA: Diagnosis not present

## 2021-07-06 DIAGNOSIS — K5981 Ogilvie syndrome: Secondary | ICD-10-CM | POA: Diagnosis present

## 2021-07-06 DIAGNOSIS — G4733 Obstructive sleep apnea (adult) (pediatric): Secondary | ICD-10-CM | POA: Diagnosis present

## 2021-07-06 DIAGNOSIS — Z9049 Acquired absence of other specified parts of digestive tract: Secondary | ICD-10-CM

## 2021-07-06 LAB — COMPREHENSIVE METABOLIC PANEL
ALT: 44 U/L (ref 0–44)
AST: 78 U/L — ABNORMAL HIGH (ref 15–41)
Albumin: 3.7 g/dL (ref 3.5–5.0)
Alkaline Phosphatase: 312 U/L — ABNORMAL HIGH (ref 38–126)
Anion gap: 18 — ABNORMAL HIGH (ref 5–15)
BUN: 27 mg/dL — ABNORMAL HIGH (ref 6–20)
CO2: 17 mmol/L — ABNORMAL LOW (ref 22–32)
Calcium: 8.7 mg/dL — ABNORMAL LOW (ref 8.9–10.3)
Chloride: 95 mmol/L — ABNORMAL LOW (ref 98–111)
Creatinine, Ser: 1.15 mg/dL — ABNORMAL HIGH (ref 0.44–1.00)
GFR, Estimated: 56 mL/min — ABNORMAL LOW (ref >60)
Glucose, Bld: 709 mg/dL (ref 70–99)
Potassium: 4.5 mmol/L (ref 3.5–5.1)
Sodium: 130 mmol/L — ABNORMAL LOW (ref 135–145)
Total Bilirubin: 0.7 mg/dL (ref 0.3–1.2)
Total Protein: 7.4 g/dL (ref 6.5–8.1)

## 2021-07-06 LAB — URINALYSIS, ROUTINE W REFLEX MICROSCOPIC
Bacteria, UA: NONE SEEN
Bilirubin Urine: NEGATIVE
Glucose, UA: >=500 mg/dL — AB
Hgb urine dipstick: NEGATIVE
Ketones, ur: NEGATIVE mg/dL
Leukocytes,Ua: NEGATIVE
Nitrite: NEGATIVE
Protein, ur: NEGATIVE mg/dL
Specific Gravity, Urine: 1.025 (ref 1.005–1.030)
pH: 8 (ref 5.0–8.0)

## 2021-07-06 LAB — BLOOD GAS, VENOUS
Acid-base deficit: 9.2 mmol/L — ABNORMAL HIGH (ref 0.0–2.0)
Acid-base deficit: 7.6 mmol/L — ABNORMAL HIGH (ref 0.0–2.0)
Bicarbonate: 19.3 mmol/L — ABNORMAL LOW (ref 20.0–28.0)
Bicarbonate: 20.7 mmol/L (ref 20.0–28.0)
FIO2: 1
FIO2: 0.8
MECHVT: 400 mL
MECHVT: 400 mL
Mechanical Rate: 18
O2 Saturation: 73.3 %
O2 Saturation: 80.9 %
PEEP: 8 cmH2O
PEEP: 8 cmH2O
Patient temperature: 37
Patient temperature: 37
RATE: 22 resp/min
pCO2, Ven: 53 mmHg (ref 44.0–60.0)
pCO2, Ven: 53 mmHg (ref 44.0–60.0)
pH, Ven: 7.17 — CL (ref 7.250–7.430)
pH, Ven: 7.2 — ABNORMAL LOW (ref 7.250–7.430)
pO2, Ven: 50 mmHg — ABNORMAL HIGH (ref 32.0–45.0)
pO2, Ven: 56 mmHg — ABNORMAL HIGH (ref 32.0–45.0)

## 2021-07-06 LAB — CBC WITH DIFFERENTIAL/PLATELET
Abs Immature Granulocytes: 0.8 10*3/uL — ABNORMAL HIGH (ref 0.00–0.07)
Band Neutrophils: 7 %
Basophils Absolute: 0 10*3/uL (ref 0.0–0.1)
Basophils Relative: 0 %
Eosinophils Absolute: 0.4 10*3/uL (ref 0.0–0.5)
Eosinophils Relative: 3 %
HCT: 44.6 % (ref 36.0–46.0)
Hemoglobin: 12.3 g/dL (ref 12.0–15.0)
Lymphocytes Relative: 57 %
Lymphs Abs: 6.8 10*3/uL — ABNORMAL HIGH (ref 0.7–4.0)
MCH: 27.5 pg (ref 26.0–34.0)
MCHC: 27.6 g/dL — ABNORMAL LOW (ref 30.0–36.0)
MCV: 99.8 fL (ref 80.0–100.0)
Metamyelocytes Relative: 2 %
Monocytes Absolute: 0.1 10*3/uL (ref 0.1–1.0)
Monocytes Relative: 1 %
Myelocytes: 5 %
Neutro Abs: 3.8 10*3/uL (ref 1.7–7.7)
Neutrophils Relative %: 25 %
Platelets: 203 10*3/uL (ref 150–400)
RBC: 4.47 MIL/uL (ref 3.87–5.11)
RDW: 14.4 % (ref 11.5–15.5)
WBC: 12 10*3/uL — ABNORMAL HIGH (ref 4.0–10.5)
nRBC: 0.3 % — ABNORMAL HIGH (ref 0.0–0.2)
nRBC: 1 /100 WBC — ABNORMAL HIGH

## 2021-07-06 LAB — TROPONIN I (HIGH SENSITIVITY)
Troponin I (High Sensitivity): 39 ng/L — ABNORMAL HIGH (ref <18)
Troponin I (High Sensitivity): 27 ng/L — ABNORMAL HIGH (ref <18)

## 2021-07-06 LAB — CBG MONITORING, ED
Glucose-Capillary: >600 mg/dL (ref 70–99)
Glucose-Capillary: >600 mg/dL (ref 70–99)
Glucose-Capillary: 594 mg/dL (ref 70–99)
Glucose-Capillary: >600 mg/dL (ref 70–99)
Glucose-Capillary: 565 mg/dL (ref 70–99)
Glucose-Capillary: 473 mg/dL — ABNORMAL HIGH (ref 70–99)
Glucose-Capillary: 437 mg/dL — ABNORMAL HIGH (ref 70–99)

## 2021-07-06 LAB — BASIC METABOLIC PANEL
Anion gap: 18 — ABNORMAL HIGH (ref 5–15)
Anion gap: 9 (ref 5–15)
BUN: 29 mg/dL — ABNORMAL HIGH (ref 6–20)
BUN: 30 mg/dL — ABNORMAL HIGH (ref 6–20)
CO2: 17 mmol/L — ABNORMAL LOW (ref 22–32)
CO2: 21 mmol/L — ABNORMAL LOW (ref 22–32)
Calcium: 8.5 mg/dL — ABNORMAL LOW (ref 8.9–10.3)
Calcium: 8.1 mg/dL — ABNORMAL LOW (ref 8.9–10.3)
Chloride: 95 mmol/L — ABNORMAL LOW (ref 98–111)
Chloride: 102 mmol/L (ref 98–111)
Creatinine, Ser: 1.36 mg/dL — ABNORMAL HIGH (ref 0.44–1.00)
Creatinine, Ser: 1.07 mg/dL — ABNORMAL HIGH (ref 0.44–1.00)
GFR, Estimated: 46 mL/min — ABNORMAL LOW (ref >60)
GFR, Estimated: >60 mL/min (ref >60)
Glucose, Bld: 761 mg/dL (ref 70–99)
Glucose, Bld: 412 mg/dL — ABNORMAL HIGH (ref 70–99)
Potassium: 4.8 mmol/L (ref 3.5–5.1)
Potassium: 4.2 mmol/L (ref 3.5–5.1)
Sodium: 130 mmol/L — ABNORMAL LOW (ref 135–145)
Sodium: 132 mmol/L — ABNORMAL LOW (ref 135–145)

## 2021-07-06 LAB — GLUCOSE, CAPILLARY
Glucose-Capillary: 353 mg/dL — ABNORMAL HIGH (ref 70–99)
Glucose-Capillary: 356 mg/dL — ABNORMAL HIGH (ref 70–99)

## 2021-07-06 LAB — LACTIC ACID, PLASMA
Lactic Acid, Venous: >9 mmol/L (ref 0.5–1.9)
Lactic Acid, Venous: 7.8 mmol/L (ref 0.5–1.9)

## 2021-07-06 LAB — MAGNESIUM: Magnesium: 4.9 mg/dL — ABNORMAL HIGH (ref 1.7–2.4)

## 2021-07-06 LAB — PROTIME-INR
INR: 1.2 (ref 0.8–1.2)
Prothrombin Time: 15.5 seconds — ABNORMAL HIGH (ref 11.4–15.2)

## 2021-07-06 LAB — RESP PANEL BY RT-PCR (FLU A&B, COVID) ARPGX2
Influenza A by PCR: NEGATIVE
Influenza B by PCR: NEGATIVE
SARS Coronavirus 2 by RT PCR: NEGATIVE

## 2021-07-06 LAB — AMMONIA: Ammonia: 96 umol/L — ABNORMAL HIGH (ref 9–35)

## 2021-07-06 LAB — BETA-HYDROXYBUTYRIC ACID: Beta-Hydroxybutyric Acid: 0.32 mmol/L — ABNORMAL HIGH (ref 0.05–0.27)

## 2021-07-06 LAB — PROCALCITONIN: Procalcitonin: 0.17 ng/mL

## 2021-07-06 LAB — LIPASE, BLOOD: Lipase: 96 U/L — ABNORMAL HIGH (ref 11–51)

## 2021-07-06 LAB — PHOSPHORUS: Phosphorus: 4 mg/dL (ref 2.5–4.6)

## 2021-07-06 MED ORDER — LEVETIRACETAM IN NACL 1000 MG/100ML IV SOLN
1000.0000 mg | Freq: Once | INTRAVENOUS | Status: AC
  Administered 2021-07-07: 1000 mg via INTRAVENOUS
  Filled 2021-07-06: qty 100

## 2021-07-06 MED ORDER — POLYETHYLENE GLYCOL 3350 17 G PO PACK: 17.0000 g | PACK | Freq: Every day | ORAL | Status: DC

## 2021-07-06 MED ORDER — FENTANYL CITRATE PF 50 MCG/ML IJ SOSY
25.0000 ug | PREFILLED_SYRINGE | INTRAMUSCULAR | Status: DC | PRN
  Administered 2021-07-07 – 2021-07-09 (×10): 50 ug via INTRAVENOUS
  Filled 2021-07-06 (×12): qty 1

## 2021-07-06 MED ORDER — POTASSIUM CHLORIDE 10 MEQ/100ML IV SOLN
10.0000 meq | INTRAVENOUS | Status: AC
  Administered 2021-07-06 (×2): 10 meq via INTRAVENOUS
  Filled 2021-07-06 (×2): qty 100

## 2021-07-06 MED ORDER — FENTANYL CITRATE PF 50 MCG/ML IJ SOSY
100.0000 ug | PREFILLED_SYRINGE | INTRAMUSCULAR | Status: DC | PRN
  Administered 2021-07-06: 100 ug via INTRAVENOUS
  Filled 2021-07-06: qty 2

## 2021-07-06 MED ORDER — LACTATED RINGERS IV SOLN: INTRAVENOUS | Status: DC

## 2021-07-06 MED ORDER — POLYETHYLENE GLYCOL 3350 17 G PO PACK: 17.0000 g | PACK | Freq: Every day | ORAL | Status: DC | PRN

## 2021-07-06 MED ORDER — MIDAZOLAM HCL 2 MG/2ML IJ SOLN
1.0000 mg | INTRAMUSCULAR | Status: AC | PRN
  Administered 2021-07-07 (×3): 1 mg via INTRAVENOUS
  Filled 2021-07-06 (×3): qty 2

## 2021-07-06 MED ORDER — DOCUSATE SODIUM 100 MG PO CAPS: 100.0000 mg | ORAL_CAPSULE | Freq: Two times a day (BID) | ORAL | Status: DC | PRN

## 2021-07-06 MED ORDER — FAMOTIDINE IN NACL 20-0.9 MG/50ML-% IV SOLN
20.0000 mg | Freq: Two times a day (BID) | INTRAVENOUS | Status: DC
  Administered 2021-07-07 – 2021-07-09 (×7): 20 mg via INTRAVENOUS
  Filled 2021-07-06 (×7): qty 50

## 2021-07-06 MED ORDER — NOREPINEPHRINE 4 MG/250ML-% IV SOLN
0.0000 ug/min | INTRAVENOUS | Status: DC
  Administered 2021-07-06: 2 ug/min via INTRAVENOUS
  Administered 2021-07-06: 7 ug/min via INTRAVENOUS
  Filled 2021-07-06 (×3): qty 250

## 2021-07-06 MED ORDER — DEXTROSE 50 % IV SOLN: 0.0000 mL | INTRAVENOUS | Status: DC | PRN

## 2021-07-06 MED ORDER — SODIUM CHLORIDE 0.9 % IV SOLN
250.0000 mL | INTRAVENOUS | Status: DC
  Administered 2021-07-08: 250 mL via INTRAVENOUS

## 2021-07-06 MED ORDER — INSULIN REGULAR(HUMAN) IN NACL 100-0.9 UT/100ML-% IV SOLN
INTRAVENOUS | Status: DC
  Administered 2021-07-06: 21 [IU]/h via INTRAVENOUS
  Administered 2021-07-06: 15 [IU]/h via INTRAVENOUS
  Administered 2021-07-07: 2.8 [IU]/h via INTRAVENOUS
  Filled 2021-07-06 (×3): qty 100

## 2021-07-06 MED ORDER — SODIUM CHLORIDE 0.9 % IV BOLUS
1000.0000 mL | Freq: Once | INTRAVENOUS | Status: AC
  Administered 2021-07-06: 1000 mL via INTRAVENOUS

## 2021-07-06 MED ORDER — ROCURONIUM BROMIDE 10 MG/ML (PF) SYRINGE
PREFILLED_SYRINGE | INTRAVENOUS | Status: AC
  Filled 2021-07-06: qty 10

## 2021-07-06 MED ORDER — HEPARIN SODIUM (PORCINE) 5000 UNIT/ML IJ SOLN
5000.0000 [IU] | Freq: Three times a day (TID) | INTRAMUSCULAR | Status: DC
  Administered 2021-07-07 – 2021-07-08 (×5): 5000 [IU] via SUBCUTANEOUS
  Filled 2021-07-06 (×5): qty 1

## 2021-07-06 MED ORDER — MIDAZOLAM HCL 2 MG/2ML IJ SOLN
1.0000 mg | INTRAMUSCULAR | Status: DC | PRN
  Administered 2021-07-06 – 2021-07-10 (×5): 1 mg via INTRAVENOUS
  Filled 2021-07-06 (×5): qty 2

## 2021-07-06 MED ORDER — LEVETIRACETAM IN NACL 500 MG/100ML IV SOLN
500.0000 mg | Freq: Two times a day (BID) | INTRAVENOUS | Status: DC
  Administered 2021-07-07 – 2021-07-10 (×6): 500 mg via INTRAVENOUS
  Filled 2021-07-06 (×7): qty 100

## 2021-07-06 MED ORDER — ETOMIDATE 2 MG/ML IV SOLN
INTRAVENOUS | Status: AC
  Filled 2021-07-06: qty 10

## 2021-07-06 MED ORDER — DOCUSATE SODIUM 50 MG/5ML PO LIQD
100.0000 mg | Freq: Two times a day (BID) | ORAL | Status: DC
  Filled 2021-07-06: qty 10

## 2021-07-06 MED ORDER — SODIUM CHLORIDE 0.9 % IV SOLN
3.0000 g | Freq: Four times a day (QID) | INTRAVENOUS | Status: DC
  Administered 2021-07-07 (×2): 3 g via INTRAVENOUS
  Filled 2021-07-06 (×5): qty 8

## 2021-07-06 MED ORDER — FENTANYL CITRATE PF 50 MCG/ML IJ SOSY: 100.0000 ug | PREFILLED_SYRINGE | INTRAMUSCULAR | Status: DC | PRN

## 2021-07-06 MED ORDER — DEXTROSE IN LACTATED RINGERS 5 % IV SOLN
INTRAVENOUS | Status: DC
  Filled 2021-07-06 (×2): qty 1000

## 2021-07-06 MED ORDER — SODIUM CHLORIDE 0.9 % IV SOLN
3.0000 g | Freq: Once | INTRAVENOUS | Status: AC
  Administered 2021-07-06: 3 g via INTRAVENOUS
  Filled 2021-07-06: qty 8

## 2021-07-06 MED ORDER — PROPOFOL 1000 MG/100ML IV EMUL
INTRAVENOUS | Status: AC
  Filled 2021-07-06: qty 100

## 2021-07-06 MED ORDER — IOHEXOL 350 MG/ML SOLN
100.0000 mL | Freq: Once | INTRAVENOUS | Status: AC | PRN
  Administered 2021-07-06: 100 mL via INTRAVENOUS

## 2021-07-06 NOTE — ED Notes (Signed)
IV team RN at bedside.  

## 2021-07-06 NOTE — Consult Note (Signed)
Pharmacy Antibiotic Note  Kim Hicks is a 56 y.o. female admitted on 07/17/2021 with aspiration pneumonia.  Pharmacy has been consulted for Unasyn dosing dosing.  Plan: Unasyn 3 g IV q6h Monitor clinical picture and renal function F/U C&S, abx deescalation / LOT   Height: 5' 5"  (165.1 cm) Weight: 105.2 kg (231 lb 14.8 oz) IBW/kg (Calculated) : 57  Temp (24hrs), Avg:95.3 F (35.2 C), Min:94.9 F (34.9 C), Max:96.8 F (36 C)  Recent Labs  Lab 06/30/21 1109 07/02/2021 1543 07/19/2021 1637 07/05/2021 1811 07/05/2021 2145  WBC 6.0 12.0*  --   --   --   CREATININE 0.75 1.15* 1.36*  --  1.07*  LATICACIDVEN  --  >9.0*  --  7.8*  --     Estimated Creatinine Clearance: 70.7 mL/min (A) (by C-G formula based on SCr of 1.07 mg/dL (H)).    Allergies  Allergen Reactions   Dilaudid [Hydromorphone] Hives   Talwin [Pentazocine]     Turned blue around lips and face, rash all over body    Antimicrobials this admission: 2/12 Unasyn >>   Dose adjustments this admission:   Microbiology results:   Thank you for allowing pharmacy to be a part of this patients care.  Darnelle Bos, PharmD 07/19/2021 10:07 PM

## 2021-07-06 NOTE — ED Notes (Addendum)
Critical Result: Lactic greater than 9 Glucose 709  [place], MD aware

## 2021-07-06 NOTE — Progress Notes (Signed)
eLink Physician-Brief Progress Note Patient Name: Kim Hicks DOB: 01/21/1966 MRN: [id number]   Date of Service  07/21/2021  HPI/Events of Note  56 year old woman admitted to ICU following cardiac arrest. In ER, surgery was consulted. Their note is pending. CCM at bedside placing central line  eICU Interventions  IV fluids, DKA protocol. Serial BMP, IV antibiotics, follow serial lactate, follow surgery consult. Adjust vent based on ABG. Pressors to keep map > 65. DVT and GI prophylaxis. Aim for normothermia. Call E link if needed.      Intervention Category Major Interventions: Respiratory failure - evaluation and management;Shock - evaluation and management Evaluation Type: New Patient Evaluation  Margaretmary Lombard 07/08/2021, 10:05 PM

## 2021-07-06 NOTE — ED Notes (Signed)
Patient belongings:  Purple Shirt Blanket Silver colored watch Silver colored necklace  All placed in patient belongings bag in room

## 2021-07-06 NOTE — ED Notes (Signed)
Patient moved from EMS stretcher to ED stretcher. Pulses found at this time

## 2021-07-06 NOTE — ED Provider Notes (Signed)
[facility] Provider Note    Event Date/Time   First MD Initiated Contact with Patient 07/21/2021 1559     (approximate)   History   Chief Complaint CPR in progress   HPI  Kim Hicks is a 56 y.o. female with past medical history of hypertension, hyperlipidemia, diabetes, NASH cirrhosis, and recurrent bile leak status post stenting who presents to the ED for cardiac arrest.  Per husband, patient began complaining of pain in the left upper quadrant of her abdomen earlier today and both she and family had noted significant distention in her abdomen.  Husband states that he was in the other room when he heard her collapse to the ground prior to arrival.  EMS was called and patient had been down on the ground for about 10 minutes when they arrived.  She was found to be in asystole and CPR was initiated, patient reportedly had brief period of ROSC during transport before once again going into asystole.  CPR was restarted and is in progress at the time of patient's arrival.     Physical Exam   Triage Vital Signs: ED Triage Vitals  Enc Vitals Group     BP 06/25/2021 1546 114/67     Pulse Rate 06/25/2021 1546 87     Resp 07/01/2021 1546 (!) 35     Temp --      Temp src --      SpO2 07/21/2021 1541 98 %     Weight 07/18/2021 1546 231 lb 14.8 oz (105.2 kg)     Height 07/08/2021 1546 5' 5"  (1.651 m)     Head Circumference --      Peak Flow --      Pain Score 07/20/2021 1546 0     Pain Loc --      Pain Edu? --      Excl. in [place]? --     Most recent vital signs: Vitals:   07/05/2021 1900 07/15/2021 1915  BP: 116/90   Pulse: 87 86  Resp: (!) 22 (!) 22  Temp: (!) 94.9 F (34.9 C) (!) 94.9 F (34.9 C)  SpO2: 98% 97%    Constitutional: Unresponsive. Eyes: Conjunctivae are normal.  Pupils 3 mm and minimally reactive. Head: Atraumatic. Nose: No congestion/rhinnorhea. Mouth/Throat: Mucous membranes are moist.  Cardiovascular: Normal rate, regular rhythm. Grossly normal  heart sounds.  2+ radial and DP pulses bilaterally. Respiratory: Agonal respirations noted.  Lungs CTAB. Gastrointestinal: Markedly distended abdomen diffusely that is firm to touch with no fluid wave. Musculoskeletal: No lower extremity edema.  Neurologic: Unresponsive with no response to painful stimuli.    ED Results / Procedures / Treatments   Labs (all labs ordered are listed, but only abnormal results are displayed) Labs Reviewed  CBC WITH DIFFERENTIAL/PLATELET - Abnormal; Notable for the following components:      Result Value   WBC 12.0 (*)    MCHC 27.6 (*)    nRBC 0.3 (*)    Lymphs Abs 6.8 (*)    nRBC 1 (*)    Abs Immature Granulocytes 0.80 (*)    All other components within normal limits  COMPREHENSIVE METABOLIC PANEL - Abnormal; Notable for the following components:   Sodium 130 (*)    Chloride 95 (*)    CO2 17 (*)    Glucose, Bld 709 (*)    BUN 27 (*)    Creatinine, Ser 1.15 (*)    Calcium 8.7 (*)    AST 78 (*)  Alkaline Phosphatase 312 (*)    GFR, Estimated 56 (*)    Anion gap 18 (*)    All other components within normal limits  LACTIC ACID, PLASMA - Abnormal; Notable for the following components:   Lactic Acid, Venous >9.0 (*)    All other components within normal limits  LACTIC ACID, PLASMA - Abnormal; Notable for the following components:   Lactic Acid, Venous 7.8 (*)    All other components within normal limits  PROTIME-INR - Abnormal; Notable for the following components:   Prothrombin Time 15.5 (*)    All other components within normal limits  AMMONIA - Abnormal; Notable for the following components:   Ammonia 96 (*)    All other components within normal limits  LIPASE, BLOOD - Abnormal; Notable for the following components:   Lipase 96 (*)    All other components within normal limits  MAGNESIUM - Abnormal; Notable for the following components:   Magnesium 4.9 (*)    All other components within normal limits  BETA-HYDROXYBUTYRIC ACID -  Abnormal; Notable for the following components:   Beta-Hydroxybutyric Acid 0.32 (*)    All other components within normal limits  BASIC METABOLIC PANEL - Abnormal; Notable for the following components:   Sodium 130 (*)    Chloride 95 (*)    CO2 17 (*)    Glucose, Bld 761 (*)    BUN 29 (*)    Creatinine, Ser 1.36 (*)    Calcium 8.5 (*)    GFR, Estimated 46 (*)    Anion gap 18 (*)    All other components within normal limits  URINALYSIS, ROUTINE W REFLEX MICROSCOPIC - Abnormal; Notable for the following components:   Color, Urine STRAW (*)    APPearance CLEAR (*)    Glucose, UA >=500 (*)    All other components within normal limits  BLOOD GAS, VENOUS - Abnormal; Notable for the following components:   pH, Ven 7.17 (*)    pO2, Ven 50.0 (*)    Bicarbonate 19.3 (*)    Acid-base deficit 9.2 (*)    All other components within normal limits  BLOOD GAS, VENOUS - Abnormal; Notable for the following components:   pH, Ven 7.20 (*)    pO2, Ven 56.0 (*)    Acid-base deficit 7.6 (*)    All other components within normal limits  CBG MONITORING, ED - Abnormal; Notable for the following components:   Glucose-Capillary >600 (*)    All other components within normal limits  CBG MONITORING, ED - Abnormal; Notable for the following components:   Glucose-Capillary >600 (*)    All other components within normal limits  CBG MONITORING, ED - Abnormal; Notable for the following components:   Glucose-Capillary >600 (*)    All other components within normal limits  CBG MONITORING, ED - Abnormal; Notable for the following components:   Glucose-Capillary 594 (*)    All other components within normal limits  CBG MONITORING, ED - Abnormal; Notable for the following components:   Glucose-Capillary 565 (*)    All other components within normal limits  TROPONIN I (HIGH SENSITIVITY) - Abnormal; Notable for the following components:   Troponin I (High Sensitivity) 39 (*)    All other components within  normal limits  TROPONIN I (HIGH SENSITIVITY) - Abnormal; Notable for the following components:   Troponin I (High Sensitivity) 27 (*)    All other components within normal limits  RESP PANEL BY RT-PCR (FLU A&B, COVID) ARPGX2  PROCALCITONIN  BASIC METABOLIC PANEL  BASIC METABOLIC PANEL  BASIC METABOLIC PANEL  PROCALCITONIN  POC URINE PREG, ED     EKG  ED ECG REPORT I, Blake Divine, the attending physician, personally viewed and interpreted this ECG.   Date: 07/12/2021  EKG Time: 15:52  Rate: 83  Rhythm: normal sinus rhythm  Axis: LAD  Intervals:none  ST&T Change: Nonspecific ST/T changes  RADIOLOGY Chest x-ray reviewed by me with appropriate positioning of endotracheal tube, no focal infiltrate, edema, or effusion noted.  CT head reviewed by me with no obvious hemorrhage or midline shift.  CTA of chest reviewed with no obvious PE, multiple rib fractures noted along with infiltrate in the right lower lobe.  CT of abdomen/pelvis is remarkable for markedly dilated and air-filled large bowel.  PROCEDURES:  Critical Care performed: Yes, see critical care procedure note(s)  .Critical Care Performed by: Blake Divine, MD Authorized by: Blake Divine, MD   Critical care provider statement:    Critical care time (minutes):  45   Critical care time was exclusive of:  Separately billable procedures and treating other patients and teaching time   Critical care was necessary to treat or prevent imminent or life-threatening deterioration of the following conditions:  Endocrine crisis, shock and cardiac failure   Critical care was time spent personally by me on the following activities:  Development of treatment plan with patient or surrogate, discussions with consultants, evaluation of patient's response to treatment, examination of patient, ordering and review of laboratory studies, ordering and review of radiographic studies, ordering and performing treatments and interventions,  pulse oximetry, re-evaluation of patient's condition and review of old charts   I assumed direction of critical care for this patient from another provider in my specialty: no     Care discussed with: admitting provider   Procedure Name: Intubation Date/Time: 07/19/2021 7:40 PM Performed by: Blake Divine, MD Pre-anesthesia Checklist: Patient identified, Patient being monitored, Emergency Drugs available, Timeout performed and Suction available Oxygen Delivery Method: Non-rebreather mask Preoxygenation: Pre-oxygenation with 100% oxygen Induction Type: Rapid sequence Ventilation: Mask ventilation without difficulty Laryngoscope Size: Glidescope and 4 Grade View: Grade I Tube size: 7.5 mm Number of attempts: 1 Airway Equipment and Method: Video-laryngoscopy Placement Confirmation: ETT inserted through vocal cords under direct vision, CO2 detector and Breath sounds checked- equal and bilateral Secured at: 24 cm Tube secured with: ETT holder Dental Injury: Teeth and Oropharynx as per pre-operative assessment       MEDICATIONS ORDERED IN ED: Medications  propofol (DIPRIVAN) 1000 MG/100ML infusion (  Not Given 06/29/2021 1729)  fentaNYL (SUBLIMAZE) injection 100 mcg (100 mcg Intravenous Given 07/04/2021 1735)  fentaNYL (SUBLIMAZE) injection 100 mcg (has no administration in time range)  midazolam (VERSED) injection 1 mg (has no administration in time range)  midazolam (VERSED) injection 1 mg (1 mg Intravenous Given 07/16/2021 1808)  0.9 %  sodium chloride infusion (0 mLs Intravenous Hold 07/03/2021 1641)  norepinephrine (LEVOPHED) 48m in 2550m(0.016 mg/mL) premix infusion (10 mcg/min Intravenous Rate/Dose Change 06/27/2021 1738)  insulin regular, human (MYXREDLIN) 100 units/ 100 mL infusion (18 Units/hr Intravenous Rate/Dose Change 07/13/2021 1827)  lactated ringers infusion ( Intravenous New Bag/Given 07/03/2021 1728)  dextrose 5 % in lactated ringers infusion (0 mLs Intravenous Hold 07/12/2021 1641)   dextrose 50 % solution 0-50 mL (has no administration in time range)  sodium chloride 0.9 % bolus 1,000 mL (1,000 mLs Intravenous New Bag/Given 07/05/2021 1628)  sodium chloride 0.9 % bolus 1,000 mL (1,000 mLs Intravenous New Bag/Given  07/15/2021 1640)  potassium chloride 10 mEq in 100 mL IVPB (10 mEq Intravenous New Bag/Given 07/04/2021 1831)  iohexol (OMNIPAQUE) 350 MG/ML injection 100 mL (100 mLs Intravenous Contrast Given 07/09/2021 1706)  Ampicillin-Sulbactam (UNASYN) 3 g in sodium chloride 0.9 % 100 mL IVPB (0 g Intravenous Stopped 07/12/2021 1837)     IMPRESSION / MDM / ASSESSMENT AND PLAN / ED COURSE  I reviewed the triage vital signs and the nursing notes.                              56 y.o. female with past medical history of hypertension, hyperlipidemia, diabetes, NASH cirrhosis, and recurrent bile leak status post biliary stenting who presents to the ED with cardiac arrest after complaining of abdominal pain and distention earlier in the day.  Differential diagnosis includes, but is not limited to, ACS, PE, arrhythmia, electrolyte abnormality, pericardial effusion, bowel obstruction, toxic megacolon, sepsis, bowel perforation.  Patient arrives with CPR ongoing however with initial pulse check was noted to have strong carotid pulse along with radial and DP pulses.  She has a markedly distended abdomen that does not appear consistent with ascites given it is firm to touch.  Patient was intubated without difficulty, required initiation of Levophed via peripheral line due to hypotension.  EKG shows no evidence of arrhythmia or ischemia, troponin mildly elevated and low suspicion for cardiac etiology of her presentation.  CMP shows significant hyperglycemia along with increased anion gap and acidosis consistent with DKA.  Patient was given 2 L IV fluid bolus and started on insulin drip, although DKA does not appear severe enough to be the primary driver of her cardiac arrest.  Additional labs remarkable  for significant lactic acidosis and mild leukocytosis, procalcitonin minimally elevated.  CT head is negative for acute process and CTA of chest shows no evidence of PE, does show right lower lobe pneumonia concerning for aspiration as well as multiple ribs and sternal fracture.  CT of abdomen/pelvis shows markedly distended large bowel concerning for toxic megacolon.  Patient was given Unasyn to cover for this as well as aspiration and case discussed with Dr. Lysle Pearl of general surgery.  He evaluated patient at the bedside and attempted manual decompression, recommends against additional antibiotic coverage or steroid administration given inflammatory cause seems less likely.  Case discussed with ICU team for admission.       FINAL CLINICAL IMPRESSION(S) / ED DIAGNOSES   Final diagnoses:  Cardiac arrest ([place])  Toxic megacolon ([place])  Diabetic ketoacidosis with coma associated with type 2 diabetes mellitus ([place])     Rx / DC Orders   ED Discharge Orders     None        Note:  This document was prepared using Dragon voice recognition software and may include unintentional dictation errors.   Blake Divine, MD 07/02/2021 1945

## 2021-07-06 NOTE — Procedures (Signed)
Arterial Catheter Insertion Procedure Note  ADANNA ZUCKERMAN  [id number]  06/05/1965  Date:07/08/2021  Time:10:53 PM    Provider Performing: Huel Cote [place]    Procedure: Insertion of Arterial Line [phone]) with US guidance ([id number])   Indication(s) Blood pressure monitoring and/or need for frequent ABGs  Consent Risks of the procedure as well as the alternatives and risks of each were explained to the patient and/or caregiver.  Consent for the procedure was obtained and is signed in the bedside chart  Anesthesia None   Time Out Verified patient identification, verified procedure, site/side was marked, verified correct patient position, special equipment/implants available, medications/allergies/relevant history reviewed, required imaging and test results available.   Sterile Technique Maximal sterile technique including full sterile barrier drape, hand hygiene, sterile gown, sterile gloves, mask, hair covering, sterile ultrasound probe cover (if used).   Procedure Description Area of catheter insertion was cleaned with chlorhexidine and draped in sterile fashion. With real-time ultrasound guidance an arterial catheter was placed into the left radial artery.  Appropriate arterial tracings confirmed on monitor.     Complications/Tolerance None; patient tolerated the procedure well.   EBL Minimal   Specimen(s) None   Venetia Night, AGACNP-BC Acute Care Nurse Practitioner [facility]   [phone] / [phone] Please see Amion for pager details.

## 2021-07-06 NOTE — Consult Note (Addendum)
Subjective:   CC: Cardiac arrest, respiratory failure, Ogilvie syndrome.  HPI:  Kim Hicks is a 56 y.o. female who was consulted by [facility] for issue above.   Per report from husband, patient has been dealing with constipation issues recently.  She has history of chronic constipation.  Took some laxatives night prior to admission and noted to have fair amount of bowel movements overnight.  Morning of admission, patient noted to have a increasing abdominal distention along with left lower quadrant pain.  Husband helped her to the bathroom and walked out when he heard a sound and found her down on the floor.  Per EMS report, patient was in asystole and required CPR, ROSC by the time she arrived in the ED. work-up noted a obviously distended abdomen with plain films and CT scan concerning for Ogilvie syndrome    Past Medical History:  has a past medical history of Anxiety, Cataract, Cirrhosis of liver ([place]), Common migraine with intractable migraine (08/07/2020), Depression, Diabetes mellitus without complication ([place]), Dysrhythmia, Edema, Family history of adverse reaction to anesthesia, Foot fracture, left, Headache, Hyperlipidemia, Hypertension, Neuromuscular disorder ([place]), On supplemental oxygen by nasal cannula, Oxygen deficiency, Palpitations, RLS (restless legs syndrome) (02/17/2021), Sleep apnea, and Wheezing.  Past Surgical History:  Past Surgical History:  Procedure Laterality Date   ABDOMINAL HYSTERECTOMY     one ovary left   BILIARY STENT PLACEMENT N/A 08/07/2019   Procedure: BILIARY STENT PLACEMENT;  Surgeon: [name], MD;  [facility]: [facility];  Service: Endoscopy;  Laterality: N/A;   BILIARY STENT PLACEMENT N/A 11/07/2019   Procedure: BILIARY STENT PLACEMENT;  Surgeon: [name], MD;  [facility];  Service: Endoscopy;  Laterality: N/A;   BILIARY STENT PLACEMENT N/A 05/13/2020   Procedure: BILIARY STENT PLACEMENT;  Surgeon: [name], MD;  [facility];  Service: Endoscopy;  Laterality: N/A;   BILIARY STENT PLACEMENT N/A 05/27/2020   Procedure: BILIARY STENT PLACEMENT;  Surgeon: [name], MD;  [facility];  Service: Endoscopy;  Laterality: N/A;   BILIARY STENT PLACEMENT N/A 06/26/2020   Procedure: BILIARY STENT PLACEMENT;  Surgeon: [name], MD;  [facility];  Service: Gastroenterology;  Laterality: N/A;   BILIARY STENT PLACEMENT N/A 09/30/2020   Procedure: BILIARY STENT PLACEMENT;  Surgeon: [name], MD;  [facility];  Service: Gastroenterology;  Laterality: N/A;   BIOPSY  06/26/2020   Procedure: BIOPSY;  Surgeon: [name], MD;  Location[facility];  Service: Gastroenterology;;   CATARACT EXTRACTION W/PHACO Right 01/11/2018   Procedure: CATARACT EXTRACTION PHACO AND INTRAOCULAR LENS PLACEMENT ([place]);  Surgeon: [name], MD;  Location: [facility];  Service: Ophthalmology;  Laterality: Right;  [place] 00:57.5 AP% 15.0 CDE 8.61 Fluid Pack Lot # [id number]   CATARACT EXTRACTION W/PHACO Left 02/15/2018   Procedure: CATARACT EXTRACTION PHACO AND INTRAOCULAR LENS PLACEMENT ([place]);  Surgeon: [name], MD;  Location: [facility];  Service: Ophthalmology;  Laterality: Left;  [place] 00:51 AP% 13.2 CDE 6.82 Fluid p ack lot # [id number]   CHOLECYSTECTOMY N/A 08/04/2019   Procedure: LAPAROSCOPIC CHOLECYSTECTOMY;  Surgeon: [name], MD;  [facility];  Service: General;  Laterality: N/A;   COLONOSCOPY     COLONOSCOPY WITH PROPOFOL N/A 12/02/2020   Procedure: COLONOSCOPY WITH PROPOFOL;  Surgeon: [name], MD;  [facility];  Service: Endoscopy;  Laterality: N/A;   ENDOSCOPIC RETROGRADE CHOLANGIOPANCREATOGRAPHY (ERCP) WITH PROPOFOL N/A 11/07/2019   Procedure: ENDOSCOPIC RETROGRADE CHOLANGIOPANCREATOGRAPHY (ERCP) WITH  PROPOFOL;  Surgeon: [name], MD;  Location[facility];  Service: Endoscopy;  Laterality: N/A;   ENDOSCOPIC  RETROGRADE CHOLANGIOPANCREATOGRAPHY (ERCP) WITH PROPOFOL N/A 05/13/2020   Procedure: ENDOSCOPIC RETROGRADE CHOLANGIOPANCREATOGRAPHY (ERCP) WITH PROPOFOL;  Surgeon: [name], MD;  [facility];  Service: Endoscopy;  Laterality: N/A;   ENDOSCOPIC RETROGRADE CHOLANGIOPANCREATOGRAPHY (ERCP) WITH PROPOFOL N/A 06/26/2020   Procedure: ENDOSCOPIC RETROGRADE CHOLANGIOPANCREATOGRAPHY (ERCP) WITH PROPOFOL;  Surgeon: [name], MD;  [facility];  Service: Gastroenterology;  Laterality: N/A;   ENDOSCOPIC RETROGRADE CHOLANGIOPANCREATOGRAPHY (ERCP) WITH PROPOFOL N/A 09/30/2020   Procedure: ENDOSCOPIC RETROGRADE CHOLANGIOPANCREATOGRAPHY (ERCP) WITH PROPOFOL;  Surgeon: [name], MD;  [facility];  Service: Gastroenterology;  Laterality: N/A;   ENDOSCOPIC RETROGRADE CHOLANGIOPANCREATOGRAPHY (ERCP) WITH PROPOFOL N/A 06/24/2021   Procedure: ENDOSCOPIC RETROGRADE CHOLANGIOPANCREATOGRAPHY (ERCP) WITH PROPOFOL;  Surgeon: [name], MD;  [facility];  Service: Gastroenterology;  Laterality: N/A;   ERCP N/A 08/07/2019   Procedure: ENDOSCOPIC RETROGRADE CHOLANGIOPANCREATOGRAPHY (ERCP);  Surgeon: [name], MD;  [facility];  Service: Endoscopy;  Laterality: N/A;   ERCP N/A 05/27/2020   Procedure: ENDOSCOPIC RETROGRADE CHOLANGIOPANCREATOGRAPHY (ERCP);  Surgeon: [name], MD;  Location: [facility];  Service: Endoscopy;  Laterality: N/A;   ESOPHAGOGASTRODUODENOSCOPY N/A 06/26/2020   Procedure: ESOPHAGOGASTRODUODENOSCOPY (EGD);  Surgeon: [name]., MD;  Location: [facility];  Service: Gastroenterology;  Laterality: N/A;   ESOPHAGOGASTRODUODENOSCOPY (EGD) WITH PROPOFOL N/A 12/02/2020   Procedure: ESOPHAGOGASTRODUODENOSCOPY (EGD) WITH PROPOFOL;  Surgeon: [name], MD;  [facility];  Service: Endoscopy;  Laterality: N/A;   HOT HEMOSTASIS N/A 12/02/2020   Procedure: HOT HEMOSTASIS (ARGON  PLASMA COAGULATION/BICAP);  Surgeon: [name], MD;  Location[facility];  Service: Endoscopy;  Laterality: N/A;   IR ANGIOGRAM VISCERAL SELECTIVE  12/27/2020   IR CATHETER TUBE CHANGE  12/16/2020   IR CHOLANGIOGRAM EXISTING TUBE  10/16/2020   IR CHOLANGIOGRAM EXISTING TUBE  01/16/2021   IR EMBO VENOUS NOT HEMORR HEMANG  INC GUIDE ROADMAPPING  12/25/2020   IR EXCHANGE BILIARY DRAIN  07/10/2020   IR EXCHANGE BILIARY DRAIN  08/28/2020   IR EXCHANGE BILIARY DRAIN  11/26/2020   IR EXCHANGE BILIARY DRAIN  12/27/2020   IR FLUORO RM 30-60 MIN  01/24/2021   IR RADIOLOGIST EVAL & MGMT  06/20/2020   IR RADIOLOGIST EVAL & MGMT  12/13/2020   IR RADIOLOGIST EVAL & MGMT  02/18/2021   POLYPECTOMY  12/02/2020   Procedure: POLYPECTOMY;  Surgeon: [name], MD;  [facility];  Service: Endoscopy;;   RCR Bilateral    REMOVAL OF STONES  08/07/2019   Procedure: REMOVAL OF STONES;  Surgeon: [name], MD;  [facility];  Service: Endoscopy;;  balloon sweep, no stones   REMOVAL OF STONES  05/27/2020   Procedure: REMOVAL OF STONES;  Surgeon: [name], MD;  [facility];  Service: Endoscopy;;   REMOVAL OF STONES  06/26/2020   Procedure: REMOVAL OF STONES;  Surgeon: [name]., MD;  [facility];  Service: Gastroenterology;;   REMOVAL OF STONES  09/30/2020   Procedure: REMOVAL OF STONES;  Surgeon: [name]., MD;  Location: [facility];  Service: Gastroenterology;;   REMOVAL OF STONES  06/24/2021   Procedure: REMOVAL OF STONES;  Surgeon: [name]., MD;  Location: [facility];  Service: Gastroenterology;;   [name]  08/07/2019   Procedure: [name];  Surgeon: [name], MD;  [facility];  Service: Endoscopy;;  STENT REMOVAL  11/07/2019   Procedure: STENT REMOVAL;  Surgeon: [name], MD;  Location: [facility];  Service: Endoscopy;;   STENT REMOVAL  05/13/2020   Procedure: STENT REMOVAL;   Surgeon: [name], MD;  [facility];  Service: Endoscopy;;   STENT REMOVAL  05/27/2020   Procedure: STENT REMOVAL;  Surgeon: [name], MD;  [facility];  Service: Endoscopy;;   STENT REMOVAL  06/26/2020   Procedure: STENT REMOVAL;  Surgeon: [name]., MD;  Location[facility];  Service: Gastroenterology;;   Lavell Islam REMOVAL  09/30/2020   Procedure: STENT REMOVAL;  Surgeon: [name]., MD;  Location[facility];  Service: Gastroenterology;;   Lavell Islam REMOVAL  06/24/2021   Procedure: STENT REMOVAL;  Surgeon: [name]., MD;  [facility];  Service: Gastroenterology;;    Family History: family history includes Depression in her mother; Diabetes in her mother; Heart failure in her mother.  Social History:  reports that she has never smoked. She has never used smokeless tobacco. She reports that she does not drink alcohol and does not use drugs.  Current Medications:  Prior to Admission medications   Medication Sig Start Date End Date Taking? Authorizing Provider  ALPRAZolam Duanne Moron) 0.5 MG tablet Take 0.5 mg by mouth 3 (three) times daily. 08/30/20  Yes [provider]  Calcium Carbonate-Vit D-Min (CALCIUM 1200) 1200-1000 MG-UNIT CHEW Chew 1 tablet by mouth daily.   Yes [provider]  diltiazem (CARDIZEM CD) 120 MG 24 hr capsule Take 120 mg by mouth daily. 05/29/21  Yes [provider]  docusate sodium (COLACE) 100 MG capsule Take 100 mg by mouth 2 (two) times daily.   Yes [provider]  Dulaglutide (TRULICITY) 4.5 FO/2.7XA SOPN Inject 4.5 mg into the skin every Monday.   Yes [provider]  DULoxetine (CYMBALTA) 30 MG capsule Take 30 mg by mouth at bedtime.   Yes [provider]  Empagliflozin-metFORMIN HCl ER (SYNJARDY XR) 25-1000 MG TB24 Take 1 tablet by mouth daily. 11/15/18  Yes [provider]  hydrocortisone (ANUSOL-HC) 25 MG suppository Place 1  suppository (25 mg total) rectally every 12 (twelve) hours. Nightly for 1 week and then every other night until prescription is complete. 06/24/21 06/24/22 Yes Mansouraty, Telford Nab., MD  Insulin Glargine [facility]) 100 UNIT/ML Inject 30 Units into the skin at bedtime. 03/20/20  Yes [provider]  LINZESS 290 MCG CAPS capsule TAKE 1 CAPSULE BY MOUTH DAILY BEFORE BREAKFAST. 02/13/21  Yes [name], MD  lisinopril (ZESTRIL) 40 MG tablet Take 40 mg by mouth every morning. 09/27/20  Yes [provider]  metFORMIN (GLUCOPHAGE) 1000 MG tablet Take 1,000 mg by mouth at bedtime. 04/11/21  Yes [provider]  pantoprazole (PROTONIX) 20 MG tablet Take 20 mg by mouth daily. 06/24/21  Yes [provider]  pregabalin (LYRICA) 100 MG capsule Two capsules in the morning and evening, one at midday Patient taking differently: Take 100-200 mg by mouth See admin instructions. Take 200 mg in the morning, 100 mg at lunch, and 200 mg at bedtime 02/17/21  Yes Kathrynn Ducking, MD  QUEtiapine (SEROQUEL) 400 MG tablet Take 2 tablets (800 mg total) by mouth at bedtime. 02/17/21  Yes Kathrynn Ducking, MD  QUEtiapine (SEROQUEL) 50 MG tablet Take 100 mg by mouth every morning. 08/30/20  Yes [provider]  simvastatin (ZOCOR) 10 MG tablet Take 10 mg by mouth daily. 12/13/17  Yes [provider]  topiramate (TOPAMAX) 50 MG tablet Take 50 mg by mouth 2 (two) times daily.   Yes [provider]  vortioxetine HBr ([name]) 20 MG TABS tablet Take 20 mg by mouth at bedtime.   Yes [provider]  cyclobenzaprine (FLEXERIL) 10 MG tablet Take 1 tablet (10 mg total) by mouth 3 (three) times daily as needed for muscle spasms. 06/27/21   Landis Martins, DPM    Allergies:  Allergies as of 06/30/2021 - Review Complete 06/30/2021  Allergen Reaction Noted   Dilaudid [hydromorphone] Hives 10/25/2020   Talwin [pentazocine]  02/14/2018    ROS:  Unable to  obtain secondary to patient's status   Objective:     BP 102/76    Pulse 90    Temp (!) 95.9 F (35.5 C)    Resp (!) 23    Ht 5' 5"  (1.651 m)    Wt 105.2 kg    SpO2 95%    BMI 38.59 kg/m   Constitutional :  Intubated and sedated  Lymphatics/Throat:  no asymmetry, masses, or scars  Respiratory:  clear to auscultation bilaterally  Cardiovascular:  regular rate and rhythm  Gastrointestinal: Extremely distended and firm abdomen .      Skin: Cool and moist, no visible surgical scars   Neuro: Absent corneal reflex, but occasionally withdraws from pain in extremities  Rectal: Intact anal sphincter tone with large amounts of watery stool and flatus during DRE    LABS:  CMP Latest Ref Rng & Units 07/18/2021 06/27/2021 07/09/2021  Glucose 70 - 99 mg/dL 412(H) 761(HH) 709(HH)  BUN 6 - 20 mg/dL 30(H) 29(H) 27(H)  Creatinine 0.44 - 1.00 mg/dL 1.07(H) 1.36(H) 1.15(H)  Sodium 135 - 145 mmol/L 132(L) 130(L) 130(L)  Potassium 3.5 - 5.1 mmol/L 4.2 4.8 4.5  Chloride 98 - 111 mmol/L 102 95(L) 95(L)  CO2 22 - 32 mmol/L 21(L) 17(L) 17(L)  Calcium 8.9 - 10.3 mg/dL 8.1(L) 8.5(L) 8.7(L)  Total Protein 6.5 - 8.1 g/dL - - 7.4  Total Bilirubin 0.3 - 1.2 mg/dL - - 0.7  Alkaline Phos 38 - 126 U/L - - 312(H)  AST 15 - 41 U/L - - 78(H)  ALT 0 - 44 U/L - - 44   CBC Latest Ref Rng & Units 06/29/2021 06/30/2021 12/25/2020  WBC 4.0 - 10.5 K/uL 12.0(H) 6.0 5.2  Hemoglobin 12.0 - 15.0 g/dL 12.3 12.0 11.6(L)  Hematocrit 36.0 - 46.0 % 44.6 37.8 39.0  Platelets 150 - 400 K/uL 203 156.0 155    RADS: CLINICAL DATA:  Pulmonary embolism (PE) suspected, high prob; Peritonitis or perforation suspected   Cardiac arrest.   EXAM: CT ANGIOGRAPHY CHEST   CT ABDOMEN AND PELVIS WITH CONTRAST   TECHNIQUE: Multidetector CT imaging of the chest was performed using the standard protocol during bolus administration of intravenous contrast. Multiplanar CT image reconstructions and MIPs were obtained to evaluate the vascular  anatomy. Multidetector CT imaging of the abdomen and pelvis was performed using the standard protocol during bolus administration of intravenous contrast.   RADIATION DOSE REDUCTION: This exam was performed according to the departmental dose-optimization program which includes automated exposure control, adjustment of the mA and/or kV according to patient size and/or use of iterative reconstruction technique.   CONTRAST:  178m OMNIPAQUE IOHEXOL 350 MG/ML SOLN   COMPARISON:  CT abdomen pelvis 11/18/2020   FINDINGS: CTA CHEST FINDINGS   Lines and tubes: Endotracheal tube terminating 2.5 cm above the carina.   Cardiovascular: Satisfactory opacification of the  pulmonary arteries to the segmental level. No evidence of pulmonary embolism. Prominent heart size. No significant pericardial effusion. The thoracic aorta is normal in caliber. No atherosclerotic plaque of the thoracic aorta. No coronary artery calcifications.   Mediastinum/Nodes: No enlarged mediastinal, hilar, or axillary lymph nodes. Thyroid gland, trachea, and esophagus demonstrate no significant findings.   Lungs/Pleura: Bilateral lower lobe subsegmental atelectasis. Right lower lobe peribronchovascular consolidation. No pulmonary nodule. No pulmonary mass. No pleural effusion. No pneumothorax.   Musculoskeletal:   No chest wall abnormality. Multiple subacute to chronic bilateral rib fractures. Query some acute minimally displaced rib fractures: Left lateral 5 and 6, left posterolateral 7, right anterior second, right anterolateral 3 - 6 rib fractures. Acute minimally displaced sternal fracture.   No suspicious lytic or blastic osseous lesions. No acute displaced fracture.   Review of the MIP images confirms the above findings.   CT ABDOMEN and PELVIS FINDINGS   Hepatobiliary: Query nodular hepatic contour with a prominent caudate lobe. Marked hepatic parenchyma heterogeneity with query underlying mass  lesion. Cholelithiasis. No gallbladder wall thickening or pericholecystic fluid. No biliary dilatation.   Pancreas: No focal lesion. Normal pancreatic contour. No surrounding inflammatory changes. No main pancreatic ductal dilatation.   Spleen: The spleen measures up to 14 cm.  No focal lesion.   Adrenals/Urinary Tract:   No adrenal nodule bilaterally.   Bilateral kidneys enhance symmetrically.   No hydronephrosis. No hydroureter.   The urinary bladder is unremarkable.   Foley catheter tip and balloon terminates within the urinary bladder lumen.   Stomach/Bowel: Enteric tube with tip and side port terminating within the gastric lumen. Stomach is within normal limits. No evidence of small bowel wall thickening or dilatation. Marked gas and fluid dilatation of the entire colon including the rectum with caliber up to 10 cm. No definite transition point. No pneumatosis. Appendix appears normal.   Vascular/Lymphatic: No abdominal aorta or iliac aneurysm. No abdominal, pelvic, or inguinal lymphadenopathy.   Reproductive: Status post hysterectomy. No adnexal masses.   Other: No intraperitoneal free fluid. No intraperitoneal free gas. No organized fluid collection.   Musculoskeletal:   No abdominal wall hernia or abnormality.   No suspicious lytic or blastic osseous lesions. No acute displaced fracture.   Review of the MIP images confirms the above findings.   IMPRESSION: 1. No pulmonary embolus. 2. Right lower lobe peribronchovascular consolidation. Developing infection/inflammation not excluded. Limited evaluation due to timing of contrast on this study. 3. Query some acute minimally displaced rib fractures: Left lateral 5-6, left posterolateral 7, right anterior 2, right anterolateral 3-6 rib fractures. Multiple subacute to chronic bilateral rib fractures. 4. Acute minimally displaced sternal fracture. 5. Marked gas and fluid dilatation of the large bowel measuring  up to 10 cm. No transition point identified. Ogilvie syndrome cannot be fully excluded. No perforation. 6. Cirrhosis with portal hypertension and marked hepatic parenchyma heterogeneity suggestive of underlying mass lesion. Please note that liver protocol enhanced MR and CT are the most sensitive tests for the screening detection of hepatocellular carcinoma in the high risk setting of cirrhosis. When the patient is clinically stable and able to follow directions and hold their breath (preferably as an outpatient) further evaluation with dedicated abdominal MRI should be considered. 7. Cholelithiasis.     Electronically Signed   By: Iven Finn M.D.   On: 07/04/2021 17:39   Assessment:   Cardiac arrest Respiratory failure DKA Ogilvie syndrome  Plan:   Review of previous CTs back to 2022 notes chronic  constipation issues along with a very dilated colon with overall diameter not significantly changed compared to the most recent CT today.  Report of her having successful bowel movements throughout the night, with absence of prior issues with diarrhea, or possible infection exposure makes toxic megacolon and unlikely etiology.  However, nursing reported multiple bowel movements since admission to the ER.  Initial neuro exam when seen in the ED noted some withdrawing to pain as well as a reported gag reflex.  Due to her just recent ROSC, along with somewhat concerning neuro exam, I discussed with patient's father and daughter that she is too unstable to proceed with any sort of surgical intervention, especially since proceeding with a total colectomy may not provide any benefit due to her extremely high risk status, and not definitive etiology for her acute cardiac arrest.  Upon reexamination later in the ICU, she now has persistent absent corneal reflex with no response to pain.  She does have sluggish but reactive pupils.  Rectal tube placed personally by myself to minimize risk of  perforation.  Very large amounts of watery diarrhea continued throughout the placement process.  She has now required increased pressor usage as well requiring a central line placement by ICU.  With the overall worsening neuro exam as well as increased use of pressors, she is still too unstable to proceed with any sort of surgical intervention.  Continue decompression the rectal tube in hopes of seeing some clinical improvement b but has a very poor prognosis at this time.  Surgery will continue to monitor closely.

## 2021-07-06 NOTE — ED Notes (Signed)
Patient to CT with this RN and RT.

## 2021-07-06 NOTE — Progress Notes (Addendum)
An USGPIV (ultrasound guided PIV) has been placed for short-term vasopressor infusion. A correctly placed ivWatch must be used when administering Vasopressors. Should this treatment be needed beyond 72 hours, central line access should be obtained.  It will be the responsibility of the bedside nurse to follow best practice to prevent extravasations. Informed patient's RN regarding that patient had more than 3 PIV access included USG PIV. Advised RN to remove 2 PIV access. HS [employer]

## 2021-07-06 NOTE — ED Notes (Signed)
Critical Result: lactic acid 7.8  Charna Archer, MD aware

## 2021-07-06 NOTE — Procedures (Signed)
Central Venous Catheter Insertion Procedure Note  KELEE CUNNINGHAM  [id number]  May 06, 1966  Date:07/04/2021  Time:10:52 PM   Provider Performing:Lee Kuang L [place]   Procedure: Insertion of Non-tunneled Central Venous [phone]) with US guidance ([id number])   Indication(s) Medication administration  Consent Risks of the procedure as well as the alternatives and risks of each were explained to the patient and/or caregiver.  Consent for the procedure was obtained and is signed in the bedside chart  Anesthesia Topical only with 1% lidocaine   Timeout Verified patient identification, verified procedure, site/side was marked, verified correct patient position, special equipment/implants available, medications/allergies/relevant history reviewed, required imaging and test results available.  Sterile Technique Maximal sterile technique including full sterile barrier drape, hand hygiene, sterile gown, sterile gloves, mask, hair covering, sterile ultrasound probe cover (if used).  Procedure Description Area of catheter insertion was cleaned with chlorhexidine and draped in sterile fashion.  With real-time ultrasound guidance a central venous catheter was placed into the right internal jugular vein. Nonpulsatile blood flow and easy flushing noted in all ports.  The catheter was sutured in place and sterile dressing applied.  Complications/Tolerance None; patient tolerated the procedure well. Chest X-ray is ordered to verify placement for internal jugular or subclavian cannulation.   Chest x-ray is not ordered for femoral cannulation.  EBL Minimal  Specimen(s) None   Venetia Night, AGACNP-BC Acute Care Nurse Practitioner [facility]   [phone] / [phone] Please see Amion for pager details.

## 2021-07-06 NOTE — ED Triage Notes (Signed)
Pt to ED via [facility] from home. Patient to ED CPR in progress. 2 rounds of CPR and 3 epi's by EMS.

## 2021-07-06 NOTE — H&P (Signed)
NAME:  Kim Hicks, MRN:  [id number], DOB:  03/12/1966, LOS: 0 ADMISSION DATE:  06/27/2021, CONSULTATION DATE:  06/25/2021 REFERRING MD:  Dr. Charna Archer, CHIEF COMPLAINT:  Cardiac Arrest  History of Present Illness:  56 yo F presenting to [facility] ED from home with her husband via EMS as CPR in progress. (History and events gathered from ED documentation & husband, [name], at bedside) Patient was in her normal state of health until waking up on the morning of 06/28/2021 with significant abdominal distension & LUQ discomfort. She began having dyspnea and her husband stepped out of the room to get a fan at which point he heard her collapse on the living room floor. EMS was called finding the patient down for about 10 minutes when they arrived and initial rhythm was asystole, CPR initiated around 14:45 per EMS report. King airway placed in field. In transport ROSC was achieved briefly before patient again pulseless in asystole.  Patient arrived CPR in progress around 15:30. Transport was estimated at about 45 minutes. ED course: ROSC documented at 15:40 shortly after arrival. Patient emergently intubated requiring mechanical ventilatory support. Initial work up revealed DKA with acidosis & CBG > 700, septic protocol initiated with initial lactic >9. Patient noted to have severe abdominal distention firm to touch. CT imaging showing toxic megacolon. Dr. Lysle Pearl from general surgery assessed the patient bedside and attempted manual decompression with minor effect; however patient too unstable for surgery.  Medications given: Unasyn, 2 L NS bolus, insulin drip & levophed drip started with continuous IVF Initial Vitals: afebrile 96, tachypneic 35, NSR 82, BP 98/66 & Spo2 88% on king airway Significant labs: (Labs/ Imaging personally reviewed) I, Domingo Pulse [place], AGACNP-BC, personally viewed and interpreted this ECG. EKG Interpretation: Date: 06/30/2021, EKG Time: 15:52, Rate: 96, Rhythm: 1st degree HB-however abnormal P  wave-not consistent/ectopic?, QRS Axis:  LAD Intervals: prolonged PR interval, mildly prolonged QTc, ST/T Wave abnormalities: non specific T wave inversions, mild STE in III only, Narrative Interpretation: 1st degree HB with abnormal P waves- ectopic? And mildly prolonged QTc Chemistry: Na+:130, K+: 4.8, Cl: 95, BUN/Cr.: 29/1.39, Serum CO2/ AG: 17/18, Mg: 4.9, Lipase: 96, alk phos: 312, AST: 78, ammonia: 96 Hematology: WBC: 12, Hgb: 12.3,  Troponin: 39 > 27, Lactic/ PCT: >9-7.8/ 0.17, COVID-19 & Influenza A/B: negative VBG: 7.17/ 53/ 50/ 19.3 >> 7.20/ 53/ 56/ 20.7 CXR 07/11/2021: no acute cardiopulmonary process. Atelectasis in mid & lower lungs with low lung volumes CT head wo contrast 06/27/2021: no intracranial abnormalities CTa chest 07/11/2021: negative for PE. RLL consolidation, query multiple acute minimally displaced rib fractures, multiple subacute/chronic bilateral rib fractures. Acute minimally displaced sternal fracture.  CT abdomen/pelvis w contrast 06/30/2021: Marked gas & fluid dilatation of the large bowel measuring up to 10 cm. No transition point identified. Ogilvie syndrome cannot be fully excluded. Cirrhosis with portal hypertensionand marked hepatic parenchyma heterogeneity suggestive of underlying mass lesion. Cholelithiasis.  Of note in chart review patient had recent elective ERCP for biliary stent removal/exchange for previous biliary leak without complication.  PCCM consulted for admission in the setting of out of hospital cardiac arrest requiring mechanical ventilatory support and toxic megacolon.  Pertinent  Medical History  NASH cirrhosis Type 2 Diabetes Mellitus Pancreatitis (06/2020) Iron deficiency anemia Cholecystectomy Chronic constipation HTN OSA on oxygen overnight (unclear how much/what kind per husband) HLD LEFT foot fracture Anxiety & depression IBS  Significant Hospital Events: Including procedures, antibiotic start and stop dates in addition to other  pertinent events  07/08/2021: Admit to ICU with toxic megacolon s/p out of hospital cardiac arrest in DKA on mechanical ventilatory support and pressors.  Interim History / Subjective:  Patient unresponsive to painful stimuli, does open eyes spontaneously with some rooting movements in mouth but not to painful stimuli or command.  Objective   Blood pressure 116/70, pulse 87, temperature (!) 94.9 F (34.9 C), resp. rate (!) 22, height _0  (1.651 m), weight 105.2 kg, SpO2 99 %.    Vent Mode: AC FiO2 (%):  [80 %-100 %] 80 % Set Rate:  [18 bmp-22 bmp] 22 bmp Vt Set:  [400 mL] 400 mL PEEP:  [8 cmH20] 8 cmH20  No intake or output data in the 24 hours ending 07/17/2021 2030 Filed Weights   07/22/2021 1546  Weight: 105.2 kg    Examination: General: Adult female, critically ill, lying in bed intubated & unresponsive requiring mechanical ventilation, NAD HEENT: MM pink/moist, anicteric, atraumatic, neck supple Neuro: RASS: -5, unable to follow commands, PERRL +3, absent corneal/gag/cough reflexes CV: s1s2 RRR, 1st degree HB on monitor, no r/m/g Pulm: Regular, non labored on PRVC 80% & PEEP 8, breath sounds clear/diminished-BUL & diminished-BLL GI: firm, distended, bs x 4 GU: foley in place with clear yellow urine Skin: scattered ecchymosis & scabbed abrasions on hands Extremities: warm/dry, pulses + 2 R/P, no edema noted  Resolved Hospital Problem list     Assessment & Plan:  Out of hospital Cardiac arrest: initial rhythm asystole  Multifactorial Circulatory shock  PMHx: HTN, palpitations 45-55 minutes estimated downtime, 10 min before CPR initiated, received no defibrillations, suspected anoxic injury. Troponin flat: 39 > 27 - candidate for Normothermia Protocol, not triggered - Continue vasopressors: levophed PRN, to maintain MAP > 65 - Echocardiogram ordered - Consult Cardiology depending on echo findings - palliative care consulted to assist with [place] discussions - Hold preadmission  medication: diltiazem & lisinopril  Suspected sepsis with septic shock due to Toxic Megacolon without perforation Aspiration Pneumonia PMHx: IBS, chronic constipation Lactic: >9- 7.8, Baseline PCT: 0.17 Initial interventions/workup included: 2 L of NS & Unasyn - additional 1 L NS to meet 30 mL/kg - Supplemental oxygen as needed, to maintain SpO2 > 90% - C-diff testing & GI panel ordered with enteric precautions in place - f/u cultures, trend lactic/ PCT - Daily CBC, monitor WBC/ fever curve - IV antibiotics: Unasyn per general surgery recommendation - IVF hydration as needed: continuous IVF per DKA protocol see below - Continue vasopressors to maintain MAP< 65, norepinephrine - Strict I/O's: alert provider if UOP < 0.5 mL/kg/hr - No steroids in the setting of toxic megacolon, maintain NPO status-OGT to ILWS, Dr. Lysle Pearl to place flexi seal to aide with decompression of bowel - general surgery consulted and following, appreciate input  Acute hypoxic respiratory failure secondary to compressive atelectasis & aspiration in the setting of toxic megacolon & cardiac arrest  PMHx: OSA - Ventilator settings: PRVC 8 mL/kg, 80 % FiO2, 8 PEEP - Wean PEEP and FiO2 for sats greater than 90% - Plateau pressures less than 30 cm H20 - VAP bundle in place - Intermittent chest x-ray & ABG - Daily WUA/ SBT as tolerated - Ensure adequate pulmonary hygiene  - F/u cultures, trend PCT - Continue Aspiration Pna coverage: Unasyn - bronchodilators PRN  Diabetic ketoacidosis secondary to sepsis from toxic megacolon PMHx: T2DM - DKA protocol initiated - Aggressive IV hydration, when blood glucose falls below 250 add D5 to IV fluids - Insulin drip ordered per Endo tool protocol -  Strict I/O's: alert provider if UOP < 0.5 mL/kg/hr - Q 4 BMP, closely monitor potassium, replace electrolytes PRN - monitor blood glucose every 1 hour, per Endo tool protocol - trend serum CO2/ AG/ Lactic, f/u A1C - Diabetes  coordinator consult  Acute Kidney Injury multifactorial in the setting of DKA, Sepsis and cardiac arrest Baseline Cr: 0.75, Cr on admission: 1.36 - Strict I/O's: alert provider if UOP < 0.5 mL/kg/hr - gentle IVF hydration  - Daily BMP, replace electrolytes PRN - Avoid nephrotoxic agents as able, ensure adequate renal perfusion - Consult nephrology if iHD or CRRT indicated   Hyperammoniemia Mild Transaminitis  Elevated Lipase PMHx: NASH Cirrhosis, cholecystectomy with biliary leak - Trend hepatic function, lipase/amylase, ammonia - hold off on lactulose at this time due to complete bowel rest in the setting of toxic megacolon, consider starting as patient stabilizes - avoid hepatotoxic agents - general surgery following, see above - consider Gastroenterology consult as needed  Suspected Myoclonic seizure activity in the setting of out of hospital cardiac arrest At risk for anoxic encephalopathy 45-55 minutes estimated downtime, 10 min before CPR initiated, received no defibrillation, Initial head CT: negative - PAD protocol in place: fentanyl & versed IVP PRN - RASS goal: -1 - EEG  ordered - consider repeat CT head/MRI brain in next 24-48 hours  - Neuro consulted, appreciate input - Keppra load ordered due to intermittent but worsening myoclonic jerking in face, feet & arms, followed by 500 mg Q 12 h  Best Practice (right click and "Reselect all SmartList Selections" daily)  Diet/type: NPO DVT prophylaxis: prophylactic heparin  GI prophylaxis: H2B Lines: Central line and Arterial Line (consented for and to be placed) Foley:  Yes, and it is still needed Code Status:  full code Last date of multidisciplinary goals of care discussion [07/12/2021]  [place] discussion with husband, [name], bedside. He confirmed that she would want to remain a FULL CODE at this time. We discussed the critical state she is in and that she is at a high risk for deterioration at this time. All questions and  concerns answered at this time.  Labs   CBC: Recent Labs  Lab 06/30/21 1109 07/18/2021 1543  WBC 6.0 12.0*  NEUTROABS 3.9 3.8  HGB 12.0 12.3  HCT 37.8 44.6  MCV 86.9 99.8  PLT 156.0 628    Basic Metabolic Panel: Recent Labs  Lab 06/30/21 1109 06/26/2021 1543 06/28/2021 1606 07/20/2021 1637  NA 135 130*  --  130*  K 5.2 No hemolysis seen* 4.5  --  4.8  CL 103 95*  --  95*  CO2 24 17*  --  17*  GLUCOSE 310* 709*  --  761*  BUN 27* 27*  --  29*  CREATININE 0.75 1.15*  --  1.36*  CALCIUM 9.2 8.7*  --  8.5*  MG  --   --  4.9*  --    GFR: Estimated Creatinine Clearance: 55.6 mL/min (A) (by C-G formula based on SCr of 1.36 mg/dL (H)). Recent Labs  Lab 06/30/21 1109 06/25/2021 1543 06/29/2021 1606 07/08/2021 1811  PROCALCITON  --   --  0.17  --   WBC 6.0 12.0*  --   --   LATICACIDVEN  --  >9.0*  --  7.8*    Liver Function Tests: Recent Labs  Lab 06/30/21 1109 07/07/2021 1543  AST 33 78*  ALT 27 44  ALKPHOS 167* 312*  BILITOT 0.3 0.7  PROT 7.7 7.4  ALBUMIN 4.3 3.7  Recent Labs  Lab 06/30/21 1109 07/22/2021 1606  LIPASE 31.0 96*  AMYLASE 23*  --    Recent Labs  Lab 07/09/2021 1606  AMMONIA 96*    ABG    Component Value Date/Time   HCO3 20.7 07/09/2021 1915   ACIDBASEDEF 7.6 (H) 07/03/2021 1915   O2SAT 80.9 06/26/2021 1915     Coagulation Profile: Recent Labs  Lab 06/29/2021 1543  INR 1.2    Cardiac Enzymes: No results for input(s): CKTOTAL, CKMB, CKMBINDEX, TROPONINI in the last 168 hours.  HbA1C: Hgb A1c MFr Bld  Date/Time Value Ref Range Status  09/30/2020 04:22 PM 9.5 (H) 4.8 - 5.6 % Final    Comment:    (NOTE) Pre diabetes:          5.7%-6.4%  Diabetes:              >6.4%  Glycemic control for   <7.0% adults with diabetes   06/27/2020 05:00 PM 8.9 (H) 4.8 - 5.6 % Final    Comment:    (NOTE) Pre diabetes:          5.7%-6.4%  Diabetes:              >6.4%  Glycemic control for   <7.0% adults with diabetes     CBG: Recent Labs  Lab  06/29/2021 1723 06/28/2021 1755 07/15/2021 1826 06/30/2021 1900 07/15/2021 1946  GLUCAP >600* >600* 594* 565* 473*    Review of Systems:   UTA- patient unresponsive and unable to participate in interview.  Past Medical History:  She,  has a past medical history of Anxiety, Cataract, Cirrhosis of liver ([place]), Common migraine with intractable migraine (08/07/2020), Depression, Diabetes mellitus without complication ([place]), Dysrhythmia, Edema, Family history of adverse reaction to anesthesia, Foot fracture, left, Headache, Hyperlipidemia, Hypertension, Neuromuscular disorder ([place]), On supplemental oxygen by nasal cannula, Oxygen deficiency, Palpitations, RLS (restless legs syndrome) (02/17/2021), Sleep apnea, and Wheezing.   Surgical History:   Past Surgical History:  Procedure Laterality Date   ABDOMINAL HYSTERECTOMY     one ovary left   BILIARY STENT PLACEMENT N/A 08/07/2019   Procedure: BILIARY STENT PLACEMENT;  Surgeon: [name], MD;  Location: [facility];  Service: Endoscopy;  Laterality: N/A;   BILIARY STENT PLACEMENT N/A 11/07/2019   Procedure: BILIARY STENT PLACEMENT;  Surgeon: [name], MD;  Location: [facility];  Service: Endoscopy;  Laterality: N/A;   BILIARY STENT PLACEMENT N/A 05/13/2020   Procedure: BILIARY STENT PLACEMENT;  Surgeon: [name], MD;  [facility]: [facility];  Service: Endoscopy;  Laterality: N/A;   BILIARY STENT PLACEMENT N/A 05/27/2020   Procedure: BILIARY STENT PLACEMENT;  Surgeon: [name], MD;  [facility];  Service: Endoscopy;  Laterality: N/A;   BILIARY STENT PLACEMENT N/A 06/26/2020   Procedure: BILIARY STENT PLACEMENT;  Surgeon: [name], MD;  Location: [facility];  Service: Gastroenterology;  Laterality: N/A;   BILIARY STENT PLACEMENT N/A 09/30/2020   Procedure: BILIARY STENT PLACEMENT;  Surgeon: [name], MD;  [facility]: [facility];  Service: Gastroenterology;  Laterality: N/A;   BIOPSY   06/26/2020   Procedure: BIOPSY;  Surgeon: [name], MD;  Location: [facility];  Service: Gastroenterology;;   CATARACT EXTRACTION W/PHACO Right 01/11/2018   Procedure: CATARACT EXTRACTION PHACO AND INTRAOCULAR LENS PLACEMENT ([place]);  Surgeon: [name], MD;  Location: [facility];  Service: Ophthalmology;  Laterality: Right;  [place] 00:57.5 AP% 15.0 CDE 8.61 Fluid Pack Lot # [id number]   CATARACT EXTRACTION W/PHACO  Left 02/15/2018   Procedure: CATARACT EXTRACTION PHACO AND INTRAOCULAR LENS PLACEMENT ([facility]);  Surgeon: [name], MD;  Location: [facility];  Service: Ophthalmology;  Laterality: Left;  [place] 00:51 AP% 13.2 CDE 6.82 Fluid p ack lot # [id number]   CHOLECYSTECTOMY N/A 08/04/2019   Procedure: LAPAROSCOPIC CHOLECYSTECTOMY;  Surgeon: [name], MD;  Location: [facility];  Service: General;  Laterality: N/A;   COLONOSCOPY     COLONOSCOPY WITH PROPOFOL N/A 12/02/2020   Procedure: COLONOSCOPY WITH PROPOFOL;  Surgeon: [name], MD;  [facility];  Service: Endoscopy;  Laterality: N/A;   ENDOSCOPIC RETROGRADE CHOLANGIOPANCREATOGRAPHY (ERCP) WITH PROPOFOL N/A 11/07/2019   Procedure: ENDOSCOPIC RETROGRADE CHOLANGIOPANCREATOGRAPHY (ERCP) WITH PROPOFOL;  Surgeon: [name], MD;  [facility];  Service: Endoscopy;  Laterality: N/A;   ENDOSCOPIC RETROGRADE CHOLANGIOPANCREATOGRAPHY (ERCP) WITH PROPOFOL N/A 05/13/2020   Procedure: ENDOSCOPIC RETROGRADE CHOLANGIOPANCREATOGRAPHY (ERCP) WITH PROPOFOL;  Surgeon: [name], MD;  [facility];  Service: Endoscopy;  Laterality: N/A;   ENDOSCOPIC RETROGRADE CHOLANGIOPANCREATOGRAPHY (ERCP) WITH PROPOFOL N/A 06/26/2020   Procedure: ENDOSCOPIC RETROGRADE CHOLANGIOPANCREATOGRAPHY (ERCP) WITH PROPOFOL;  Surgeon: [name], MD;  [facility];  Service: Gastroenterology;  Laterality: N/A;   ENDOSCOPIC RETROGRADE CHOLANGIOPANCREATOGRAPHY (ERCP) WITH PROPOFOL N/A 09/30/2020    Procedure: ENDOSCOPIC RETROGRADE CHOLANGIOPANCREATOGRAPHY (ERCP) WITH PROPOFOL;  Surgeon: [name], MD;  [facility];  Service: Gastroenterology;  Laterality: N/A;   ENDOSCOPIC RETROGRADE CHOLANGIOPANCREATOGRAPHY (ERCP) WITH PROPOFOL N/A 06/24/2021   Procedure: ENDOSCOPIC RETROGRADE CHOLANGIOPANCREATOGRAPHY (ERCP) WITH PROPOFOL;  Surgeon: [name], MD;  [facility];  Service: Gastroenterology;  Laterality: N/A;   ERCP N/A 08/07/2019   Procedure: ENDOSCOPIC RETROGRADE CHOLANGIOPANCREATOGRAPHY (ERCP);  Surgeon: [name], MD;  Location: [facility];  Service: Endoscopy;  Laterality: N/A;   ERCP N/A 05/27/2020   Procedure: ENDOSCOPIC RETROGRADE CHOLANGIOPANCREATOGRAPHY (ERCP);  Surgeon: [name], MD;  Location: [facility];  Service: Endoscopy;  Laterality: N/A;   ESOPHAGOGASTRODUODENOSCOPY N/A 06/26/2020   Procedure: ESOPHAGOGASTRODUODENOSCOPY (EGD);  Surgeon: [name]., MD;  Location: [facility];  Service: Gastroenterology;  Laterality: N/A;   ESOPHAGOGASTRODUODENOSCOPY (EGD) WITH PROPOFOL N/A 12/02/2020   Procedure: ESOPHAGOGASTRODUODENOSCOPY (EGD) WITH PROPOFOL;  Surgeon: [name], MD;  [facility];  Service: Endoscopy;  Laterality: N/A;   HOT HEMOSTASIS N/A 12/02/2020   Procedure: HOT HEMOSTASIS (ARGON PLASMA COAGULATION/BICAP);  Surgeon: [name], MD;  Location: [facility];  Service: Endoscopy;  Laterality: N/A;   IR ANGIOGRAM VISCERAL SELECTIVE  12/27/2020   IR CATHETER TUBE CHANGE  12/16/2020   IR CHOLANGIOGRAM EXISTING TUBE  10/16/2020   IR CHOLANGIOGRAM EXISTING TUBE  01/16/2021   IR EMBO VENOUS NOT HEMORR HEMANG  INC GUIDE ROADMAPPING  12/25/2020   IR EXCHANGE BILIARY DRAIN  07/10/2020   IR EXCHANGE BILIARY DRAIN  08/28/2020   IR EXCHANGE BILIARY DRAIN  11/26/2020   IR EXCHANGE BILIARY DRAIN  12/27/2020   IR FLUORO RM 30-60 MIN  01/24/2021   IR RADIOLOGIST EVAL & MGMT  06/20/2020   IR RADIOLOGIST  EVAL & MGMT  12/13/2020   IR RADIOLOGIST EVAL & MGMT  02/18/2021   POLYPECTOMY  12/02/2020   Procedure: POLYPECTOMY;  Surgeon: [name], MD;  [facility];  Service: Endoscopy;;   RCR Bilateral    REMOVAL OF STONES  08/07/2019   Procedure: REMOVAL OF STONES;  Surgeon: [name], MD;  [facility]: [facility];  Service: Endoscopy;;  balloon sweep, no stones   REMOVAL OF STONES  05/27/2020  Procedure: REMOVAL OF STONES;  Surgeon: [name], MD;  Location: [facility];  Service: Endoscopy;;   REMOVAL OF STONES  06/26/2020   Procedure: REMOVAL OF STONES;  Surgeon: [name]., MD;  Location: [facility];  Service: Gastroenterology;;   REMOVAL OF STONES  09/30/2020   Procedure: REMOVAL OF STONES;  Surgeon: [name]., MD;  Location: [facility];  Service: Gastroenterology;;   REMOVAL OF STONES  06/24/2021   Procedure: REMOVAL OF STONES;  Surgeon: [name]., MD;  Location: [facility];  Service: Gastroenterology;;   [name]  08/07/2019   Procedure: SPHINCTEROTOMY;  Surgeon: [name], MD;  [facility];  Service: Endoscopy;;   STENT REMOVAL  11/07/2019   Procedure: STENT REMOVAL;  Surgeon: [name], MD;  [facility];  Service: Endoscopy;;   STENT REMOVAL  05/13/2020   Procedure: STENT REMOVAL;  Surgeon: [name], MD;  [facility];  Service: Endoscopy;;   STENT REMOVAL  05/27/2020   Procedure: STENT REMOVAL;  Surgeon: [name], MD;  [facility];  Service: Endoscopy;;   STENT REMOVAL  06/26/2020   Procedure: STENT REMOVAL;  Surgeon: [name]., MD;  Location: [facility];  Service: Gastroenterology;;   Lavell Islam REMOVAL  09/30/2020   Procedure: STENT REMOVAL;  Surgeon: [name]., MD;  Location: [facility];  Service: Gastroenterology;;   Lavell Islam REMOVAL  06/24/2021   Procedure: STENT REMOVAL;  Surgeon: [name]., MD;  Location:  [facility];  Service: Gastroenterology;;     Social History:   reports that she has never smoked. She has never used smokeless tobacco. She reports that she does not drink alcohol and does not use drugs.   Family History:  Her family history includes Depression in her mother; Diabetes in her mother; Heart failure in her mother. There is no history of Colon cancer, Stomach cancer, Esophageal cancer, Pancreatic cancer, Colon polyps, or Rectal cancer.   Allergies Allergies  Allergen Reactions   Dilaudid [Hydromorphone] Hives   [place] [Pentazocine]     Turned blue around lips and face, rash all over body     Home Medications  Prior to Admission medications   Medication Sig Start Date End Date Taking? Authorizing Provider  ALPRAZolam Duanne Moron) 0.5 MG tablet Take 0.5 mg by mouth 3 (three) times daily. 08/30/20   [provider]  Calcium Carbonate-Vit D-Min (CALCIUM 1200) 1200-1000 MG-UNIT CHEW Chew 1 tablet by mouth daily.    [provider]  cyclobenzaprine (FLEXERIL) 10 MG tablet Take 1 tablet (10 mg total) by mouth 3 (three) times daily as needed for muscle spasms. 06/27/21   Landis Martins, DPM  diltiazem (CARDIZEM CD) 120 MG 24 hr capsule Take 120 mg by mouth daily. 05/29/21   [provider]  diltiazem (DILACOR XR) 120 MG 24 hr capsule Take 120 mg by mouth daily.    [provider]  docusate sodium (COLACE) 100 MG capsule Take 100 mg by mouth 2 (two) times daily.    [provider]  Dulaglutide (TRULICITY) 4.5 HU/3.1SH SOPN Inject 4.5 mg into the skin every Monday.    [provider]  DULoxetine (CYMBALTA) 30 MG capsule Take 30 mg by mouth at bedtime.    [provider]  Empagliflozin-metFORMIN HCl ER (SYNJARDY XR) 25-1000 MG TB24 Take 1 tablet by mouth daily. 11/15/18   [provider]  hydrocortisone (ANUSOL-HC) 25 MG suppository Place 1 suppository (25 mg total) rectally every 12 (twelve) hours. Nightly  for 1 week and  then every other night until prescription is complete. 06/24/21 06/24/22  Mansouraty, Telford Nab., MD  Insulin Glargine [facility]) 100 UNIT/ML Inject 30 Units into the skin at bedtime. 03/20/20   [provider]  lactulose (CHRONULAC) 10 GM/15ML solution Take 15 mLs (10 g total) by mouth 3 (three) times daily. 11/19/20   [name], MD  LINZESS 290 MCG CAPS capsule TAKE 1 CAPSULE BY MOUTH DAILY BEFORE BREAKFAST. 02/13/21   [name], MD  lisinopril (ZESTRIL) 40 MG tablet Take 40 mg by mouth every morning. 09/27/20   [provider]  metFORMIN (GLUCOPHAGE) 1000 MG tablet Take 1,000 mg by mouth at bedtime. 04/11/21   [provider]  ondansetron (ZOFRAN) 4 MG tablet Take 1 tablet (4 mg total) by mouth every 8 (eight) hours as needed for nausea or vomiting. To use prior to bone stimulator treatment 06/13/21   Landis Martins, DPM  pantoprazole (PROTONIX) 20 MG tablet Take 20 mg by mouth daily. 06/24/21   [provider]  PAXLOVID, 300/100, 20 x 150 MG & 10 x 100MG TBPK Take 3 tablets by mouth 2 (two) times daily. 05/29/21   [provider]  pregabalin (LYRICA) 100 MG capsule Two capsules in the morning and evening, one at midday Patient taking differently: Take 100-200 mg by mouth See admin instructions. Take 200 mg in the morning, 100 mg at lunch, and 200 mg at bedtime 02/17/21   Kathrynn Ducking, MD  QUEtiapine (SEROQUEL) 400 MG tablet Take 2 tablets (800 mg total) by mouth at bedtime. 02/17/21   Kathrynn Ducking, MD  QUEtiapine (SEROQUEL) 50 MG tablet Take 100 mg by mouth every morning. 08/30/20   [provider]  simvastatin (ZOCOR) 10 MG tablet Take 10 mg by mouth daily. 12/13/17   [provider]  topiramate (TOPAMAX) 50 MG tablet Take 50 mg by mouth 2 (two) times daily.    [provider]  traMADol ([name]) 50 MG tablet Take 50 mg by mouth every 12 (twelve) hours as needed for severe pain. 08/24/20   [provider]  vortioxetine HBr ([name]) 20 MG TABS tablet Take 20 mg by mouth at bedtime.    [provider]     Critical care time: 70 minutes       Venetia Night, AGACNP-BC Acute Care Nurse Practitioner [facility]   [phone] / [phone] Please see Amion for pager details.

## 2021-07-07 ENCOUNTER — Inpatient Hospital Stay: Payer: Medicare Other

## 2021-07-07 ENCOUNTER — Encounter: Payer: Self-pay | Admitting: Pulmonary Disease

## 2021-07-07 ENCOUNTER — Inpatient Hospital Stay
Admit: 2021-07-07 | Discharge: 2021-07-07 | Disposition: A | Discharge status: Home / Self Care | Payer: Medicare Other | Attending: Pulmonary Disease | Admitting: Pulmonary Disease

## 2021-07-07 DIAGNOSIS — R7401 Elevation of levels of liver transaminase levels: Secondary | ICD-10-CM

## 2021-07-07 DIAGNOSIS — J9601 Acute respiratory failure with hypoxia: Secondary | ICD-10-CM

## 2021-07-07 DIAGNOSIS — R4182 Altered mental status, unspecified: Secondary | ICD-10-CM

## 2021-07-07 DIAGNOSIS — I469 Cardiac arrest, cause unspecified: Secondary | ICD-10-CM

## 2021-07-07 DIAGNOSIS — Z9911 Dependence on respirator [ventilator] status: Secondary | ICD-10-CM

## 2021-07-07 DIAGNOSIS — N179 Acute kidney failure, unspecified: Secondary | ICD-10-CM

## 2021-07-07 DIAGNOSIS — Z7189 Other specified counseling: Secondary | ICD-10-CM | POA: Diagnosis not present

## 2021-07-07 DIAGNOSIS — K5931 Toxic megacolon: Secondary | ICD-10-CM | POA: Diagnosis not present

## 2021-07-07 DIAGNOSIS — G9340 Encephalopathy, unspecified: Secondary | ICD-10-CM | POA: Diagnosis not present

## 2021-07-07 DIAGNOSIS — R569 Unspecified convulsions: Secondary | ICD-10-CM

## 2021-07-07 DIAGNOSIS — Z978 Presence of other specified devices: Secondary | ICD-10-CM

## 2021-07-07 DIAGNOSIS — E1111 Type 2 diabetes mellitus with ketoacidosis with coma: Secondary | ICD-10-CM

## 2021-07-07 HISTORY — DX: Acute kidney failure, unspecified: N17.9

## 2021-07-07 LAB — BLOOD GAS, ARTERIAL
Acid-base deficit: 0.5 mmol/L (ref 0.0–2.0)
Bicarbonate: 24.9 mmol/L (ref 20.0–28.0)
FIO2: 0.8
MECHVT: 450 mL
O2 Saturation: 99.7 %
PEEP: 8 cmH2O
Patient temperature: 37
RATE: 22 resp/min
pCO2 arterial: 43 mmHg (ref 32.0–48.0)
pH, Arterial: 7.37 (ref 7.350–7.450)
pO2, Arterial: 200 mmHg — ABNORMAL HIGH (ref 83.0–108.0)

## 2021-07-07 LAB — BASIC METABOLIC PANEL
Anion gap: 6 (ref 5–15)
Anion gap: 7 (ref 5–15)
BUN: 24 mg/dL — ABNORMAL HIGH (ref 6–20)
BUN: 27 mg/dL — ABNORMAL HIGH (ref 6–20)
CO2: 24 mmol/L (ref 22–32)
CO2: 24 mmol/L (ref 22–32)
Calcium: 7.6 mg/dL — ABNORMAL LOW (ref 8.9–10.3)
Calcium: 7.6 mg/dL — ABNORMAL LOW (ref 8.9–10.3)
Chloride: 107 mmol/L (ref 98–111)
Chloride: 107 mmol/L (ref 98–111)
Creatinine, Ser: 0.65 mg/dL (ref 0.44–1.00)
Creatinine, Ser: 0.88 mg/dL (ref 0.44–1.00)
GFR, Estimated: >60 mL/min (ref >60)
GFR, Estimated: >60 mL/min (ref >60)
Glucose, Bld: 142 mg/dL — ABNORMAL HIGH (ref 70–99)
Glucose, Bld: 225 mg/dL — ABNORMAL HIGH (ref 70–99)
Potassium: 3.5 mmol/L (ref 3.5–5.1)
Potassium: 4.1 mmol/L (ref 3.5–5.1)
Sodium: 137 mmol/L (ref 135–145)
Sodium: 138 mmol/L (ref 135–145)

## 2021-07-07 LAB — GASTROINTESTINAL PANEL BY PCR, STOOL (REPLACES STOOL CULTURE)

## 2021-07-07 LAB — URINE DRUG SCREEN, QUALITATIVE (ARMC ONLY)
Amphetamines, Ur Screen: NOT DETECTED
Barbiturates, Ur Screen: NOT DETECTED
Benzodiazepine, Ur Scrn: NOT DETECTED
Cannabinoid 50 Ng, Ur ~~LOC~~: NOT DETECTED
Cocaine Metabolite,Ur ~~LOC~~: NOT DETECTED
MDMA (Ecstasy)Ur Screen: NOT DETECTED
Methadone Scn, Ur: NOT DETECTED
Opiate, Ur Screen: NOT DETECTED
Phencyclidine (PCP) Ur S: NOT DETECTED
Tricyclic, Ur Screen: POSITIVE — AB

## 2021-07-07 LAB — CBC
HCT: 37 % (ref 36.0–46.0)
Hemoglobin: 11.4 g/dL — ABNORMAL LOW (ref 12.0–15.0)
MCH: 27.7 pg (ref 26.0–34.0)
MCHC: 30.8 g/dL (ref 30.0–36.0)
MCV: 90 fL (ref 80.0–100.0)
Platelets: 127 10*3/uL — ABNORMAL LOW (ref 150–400)
RBC: 4.11 MIL/uL (ref 3.87–5.11)
RDW: 14.3 % (ref 11.5–15.5)
WBC: 10.2 10*3/uL (ref 4.0–10.5)
nRBC: 0 % (ref 0.0–0.2)

## 2021-07-07 LAB — HEPATIC FUNCTION PANEL
ALT: 489 U/L — ABNORMAL HIGH (ref 0–44)
AST: 714 U/L — ABNORMAL HIGH (ref 15–41)
Albumin: 3.1 g/dL — ABNORMAL LOW (ref 3.5–5.0)
Alkaline Phosphatase: 209 U/L — ABNORMAL HIGH (ref 38–126)
Bilirubin, Direct: <0.1 mg/dL (ref 0.0–0.2)
Total Bilirubin: 0.3 mg/dL (ref 0.3–1.2)
Total Protein: 5.7 g/dL — ABNORMAL LOW (ref 6.5–8.1)

## 2021-07-07 LAB — ECHOCARDIOGRAM COMPLETE
Height: 65 in
S' Lateral: 2.3 cm
Weight: 3418.01 oz

## 2021-07-07 LAB — BASIC METABOLIC PANEL WITH GFR
Anion gap: 5 (ref 5–15)
BUN: 23 mg/dL — ABNORMAL HIGH (ref 6–20)
CO2: 24 mmol/L (ref 22–32)
Calcium: 7.4 mg/dL — ABNORMAL LOW (ref 8.9–10.3)
Chloride: 109 mmol/L (ref 98–111)
Creatinine, Ser: 0.59 mg/dL (ref 0.44–1.00)
GFR, Estimated: >60 mL/min
Glucose, Bld: 214 mg/dL — ABNORMAL HIGH (ref 70–99)
Potassium: 3.8 mmol/L (ref 3.5–5.1)
Sodium: 138 mmol/L (ref 135–145)

## 2021-07-07 LAB — GLUCOSE, CAPILLARY
Glucose-Capillary: 134 mg/dL — ABNORMAL HIGH (ref 70–99)
Glucose-Capillary: 136 mg/dL — ABNORMAL HIGH (ref 70–99)
Glucose-Capillary: 137 mg/dL — ABNORMAL HIGH (ref 70–99)
Glucose-Capillary: 147 mg/dL — ABNORMAL HIGH (ref 70–99)
Glucose-Capillary: 147 mg/dL — ABNORMAL HIGH (ref 70–99)
Glucose-Capillary: 150 mg/dL — ABNORMAL HIGH (ref 70–99)
Glucose-Capillary: 162 mg/dL — ABNORMAL HIGH (ref 70–99)
Glucose-Capillary: 165 mg/dL — ABNORMAL HIGH (ref 70–99)
Glucose-Capillary: 165 mg/dL — ABNORMAL HIGH (ref 70–99)
Glucose-Capillary: 174 mg/dL — ABNORMAL HIGH (ref 70–99)
Glucose-Capillary: 182 mg/dL — ABNORMAL HIGH (ref 70–99)
Glucose-Capillary: 187 mg/dL — ABNORMAL HIGH (ref 70–99)
Glucose-Capillary: 202 mg/dL — ABNORMAL HIGH (ref 70–99)
Glucose-Capillary: 203 mg/dL — ABNORMAL HIGH (ref 70–99)
Glucose-Capillary: 221 mg/dL — ABNORMAL HIGH (ref 70–99)
Glucose-Capillary: 225 mg/dL — ABNORMAL HIGH (ref 70–99)

## 2021-07-07 LAB — LACTIC ACID, PLASMA
Lactic Acid, Venous: 2.3 mmol/L (ref 0.5–1.9)
Lactic Acid, Venous: 3.5 mmol/L (ref 0.5–1.9)

## 2021-07-07 LAB — AMMONIA: Ammonia: 47 umol/L — ABNORMAL HIGH (ref 9–35)

## 2021-07-07 LAB — PROCALCITONIN: Procalcitonin: 15.52 ng/mL

## 2021-07-07 LAB — HIV ANTIBODY (ROUTINE TESTING W REFLEX): HIV Screen 4th Generation wRfx: NONREACTIVE

## 2021-07-07 LAB — C DIFFICILE QUICK SCREEN W PCR REFLEX
C Diff antigen: NEGATIVE
C Diff interpretation: NEGATIVE
C Diff toxin: NEGATIVE

## 2021-07-07 LAB — HEMOGLOBIN A1C
Hgb A1c MFr Bld: 9.9 % — ABNORMAL HIGH (ref 4.8–5.6)
Mean Plasma Glucose: 237.43 mg/dL

## 2021-07-07 LAB — BETA-HYDROXYBUTYRIC ACID
Beta-Hydroxybutyric Acid: 0.11 mmol/L (ref 0.05–0.27)
Beta-Hydroxybutyric Acid: 0.25 mmol/L (ref 0.05–0.27)

## 2021-07-07 LAB — MAGNESIUM: Magnesium: 4.3 mg/dL — ABNORMAL HIGH (ref 1.7–2.4)

## 2021-07-07 LAB — MRSA NEXT GEN BY PCR, NASAL: MRSA by PCR Next Gen: NOT DETECTED

## 2021-07-07 LAB — AMYLASE: Amylase: 63 U/L (ref 28–100)

## 2021-07-07 LAB — PHOSPHORUS: Phosphorus: 2.7 mg/dL (ref 2.5–4.6)

## 2021-07-07 LAB — LIPASE, BLOOD: Lipase: 34 U/L (ref 11–51)

## 2021-07-07 MED ORDER — ADULT MULTIVITAMIN LIQUID CH
15.0000 mL | Freq: Every day | ORAL | Status: DC
  Administered 2021-07-08 – 2021-07-09 (×2): 15 mL
  Filled 2021-07-07 (×3): qty 15

## 2021-07-07 MED ORDER — PIPERACILLIN-TAZOBACTAM 3.375 G IVPB
3.3750 g | Freq: Three times a day (TID) | INTRAVENOUS | Status: DC
  Administered 2021-07-07 – 2021-07-09 (×6): 3.375 g via INTRAVENOUS
  Filled 2021-07-07 (×6): qty 50

## 2021-07-07 MED ORDER — LABETALOL HCL 5 MG/ML IV SOLN
10.0000 mg | INTRAVENOUS | Status: DC | PRN
  Administered 2021-07-07 (×2): 10 mg via INTRAVENOUS
  Administered 2021-07-08 (×2): 20 mg via INTRAVENOUS
  Administered 2021-07-08: 10 mg via INTRAVENOUS
  Administered 2021-07-08 (×2): 20 mg via INTRAVENOUS
  Filled 2021-07-07 (×6): qty 4

## 2021-07-07 MED ORDER — PROPOFOL 1000 MG/100ML IV EMUL
5.0000 ug/kg/min | INTRAVENOUS | Status: DC
  Administered 2021-07-07: 20 ug/kg/min via INTRAVENOUS
  Administered 2021-07-07: 25 ug/kg/min via INTRAVENOUS
  Administered 2021-07-07: 20 ug/kg/min via INTRAVENOUS
  Administered 2021-07-08: 30 ug/kg/min via INTRAVENOUS
  Filled 2021-07-07 (×5): qty 100

## 2021-07-07 MED ORDER — ORAL CARE MOUTH RINSE
15.0000 mL | OROMUCOSAL | Status: DC
  Administered 2021-07-07 – 2021-07-10 (×32): 15 mL via OROMUCOSAL

## 2021-07-07 MED ORDER — LACTATED RINGERS IV SOLN: INTRAVENOUS | Status: DC

## 2021-07-07 MED ORDER — INSULIN ASPART 100 UNIT/ML IJ SOLN: 0.0000 [IU] | INTRAMUSCULAR | Status: DC

## 2021-07-07 MED ORDER — CHLORHEXIDINE GLUCONATE CLOTH 2 % EX PADS
6.0000 | MEDICATED_PAD | Freq: Every day | CUTANEOUS | Status: DC
  Administered 2021-07-07 – 2021-07-09 (×3): 6 via TOPICAL

## 2021-07-07 MED ORDER — VITAL HIGH PROTEIN PO LIQD
1000.0000 mL | ORAL | Status: DC
  Administered 2021-07-07: 1000 mL

## 2021-07-07 MED ORDER — INSULIN DETEMIR 100 UNIT/ML ~~LOC~~ SOLN
12.0000 [IU] | Freq: Two times a day (BID) | SUBCUTANEOUS | Status: DC
  Administered 2021-07-07 – 2021-07-09 (×6): 12 [IU] via SUBCUTANEOUS
  Filled 2021-07-07 (×8): qty 0.12

## 2021-07-07 MED ORDER — LACTATED RINGERS IV BOLUS
500.0000 mL | Freq: Once | INTRAVENOUS | Status: AC
  Administered 2021-07-07: 500 mL via INTRAVENOUS

## 2021-07-07 MED ORDER — ALBUTEROL SULFATE (2.5 MG/3ML) 0.083% IN NEBU: 2.5000 mg | INHALATION_SOLUTION | RESPIRATORY_TRACT | Status: DC | PRN

## 2021-07-07 MED ORDER — INSULIN ASPART 100 UNIT/ML IJ SOLN
2.0000 [IU] | INTRAMUSCULAR | Status: DC
  Administered 2021-07-07: 4 [IU] via SUBCUTANEOUS
  Administered 2021-07-07: 2 [IU] via SUBCUTANEOUS
  Administered 2021-07-07 – 2021-07-08 (×4): 4 [IU] via SUBCUTANEOUS
  Administered 2021-07-08: 2 [IU] via SUBCUTANEOUS
  Filled 2021-07-07 (×9): qty 1

## 2021-07-07 MED ORDER — FREE WATER
30.0000 mL | Status: DC
  Administered 2021-07-07 – 2021-07-10 (×9): 30 mL

## 2021-07-07 MED ORDER — CHLORHEXIDINE GLUCONATE 0.12% ORAL RINSE (MEDLINE KIT)
15.0000 mL | Freq: Two times a day (BID) | OROMUCOSAL | Status: DC
  Administered 2021-07-07 – 2021-07-10 (×7): 15 mL via OROMUCOSAL

## 2021-07-07 MED ORDER — INSULIN DETEMIR 100 UNIT/ML ~~LOC~~ SOLN
12.0000 [IU] | Freq: Two times a day (BID) | SUBCUTANEOUS | Status: DC
  Filled 2021-07-07 (×2): qty 0.12

## 2021-07-07 MED ORDER — POTASSIUM CHLORIDE 10 MEQ/50ML IV SOLN
10.0000 meq | INTRAVENOUS | Status: AC
  Administered 2021-07-07 (×4): 10 meq via INTRAVENOUS
  Filled 2021-07-07 (×4): qty 50

## 2021-07-07 MED ORDER — MIDAZOLAM HCL 2 MG/2ML IJ SOLN
2.0000 mg | Freq: Once | INTRAMUSCULAR | Status: AC
  Administered 2021-07-07: 2 mg via INTRAVENOUS
  Filled 2021-07-07: qty 2

## 2021-07-07 NOTE — Procedures (Addendum)
EEG Inpatient Electroencephalogram Report Clinical Neurophysiology Services   Patient Name: Kim Hicks Age: 56 y.o. Sex: female MRN: [id number] Date: 07/07/2021 Location: Inpatient ICU 1 Neurologist:  Lynnae Sandhoff, MD  Clinical Information   Kim Hicks is a 56 y.o. female with asystole approximately 45-55 minutes until ROSC.  Medications   Current Facility-Administered Medications  Medication Dose Route Frequency Provider Last Rate Last Admin   0.9 %  sodium chloride infusion  250 mL Intravenous Continuous Blake Divine, MD   Held at 06/26/2021 1641   albuterol (PROVENTIL) (2.5 MG/3ML) 0.083% nebulizer solution 2.5 mg  2.5 mg Nebulization Q4H PRN Rust-Chester, Toribio Harbour L, NP       chlorhexidine gluconate (MEDLINE KIT) (PERIDEX) 0.12 % solution 15 mL  15 mL Mouth Rinse BID Ottie Glazier, MD   15 mL at 07/07/21 0955   Chlorhexidine Gluconate Cloth 2 % PADS 6 each  6 each Topical Daily Ottie Glazier, MD   6 each at 07/07/21 0955   dextrose 5 % in lactated ringers infusion   Intravenous Continuous Blake Divine, MD 125 mL/hr at 07/07/21 1200 Infusion Verify at 07/07/21 1200   dextrose 50 % solution 0-50 mL  0-50 mL Intravenous PRN Blake Divine, MD       famotidine (PEPCID) IVPB 20 mg premix  20 mg Intravenous Q12H Rust-Chester, Britton L, NP 10 mL/hr at 07/07/21 1200 Infusion Verify at 07/07/21 1200   feeding supplement (VITAL HIGH PROTEIN) liquid 1,000 mL  1,000 mL Per Tube Q24H Flora Lipps, MD       fentaNYL (SUBLIMAZE) injection 25-50 mcg  25-50 mcg Intravenous Q2H PRN Rust-Chester, Britton L, NP   50 mcg at 07/07/21 1152   free water 30 mL  30 mL Per Tube Q4H Flora Lipps, MD       heparin injection 5,000 Units  5,000 Units Subcutaneous Q8H Rust-Chester, Britton L, NP   5,000 Units at 07/07/21 0546   insulin aspart (novoLOG) injection 2-6 Units  2-6 Units Subcutaneous Q4H Beers, Brandon D, RPH       insulin detemir (LEVEMIR) injection 12 Units  12 Units Subcutaneous Q12H  [name], RPH   12 Units at 07/07/21 1236   insulin regular, human (MYXREDLIN) 100 units/ 100 mL infusion   Intravenous Continuous Blake Divine, MD 3.6 mL/hr at 07/07/21 1200 3.6 Units/hr at 07/07/21 1200   lactated ringers infusion   Intravenous Continuous Blake Divine, MD   Stopped at 07/07/21 0045   levETIRAcetam (KEPPRA) IVPB 500 mg/100 mL premix  500 mg Intravenous Q12H Rust-Chester, Britton L, NP 400 mL/hr at 07/07/21 1240 500 mg at 07/07/21 1240   MEDLINE mouth rinse  15 mL Mouth Rinse 10 times per day Ottie Glazier, MD   15 mL at 07/07/21 1243   midazolam (VERSED) injection 1 mg  1 mg Intravenous Q2H PRN Blake Divine, MD   1 mg at 07/03/2021 1808   [START ON 07/08/2021] multivitamin liquid 15 mL  15 mL Per Tube Daily Flora Lipps, MD       norepinephrine (LEVOPHED) 76m in 2571m(0.016 mg/mL) premix infusion  0-40 mcg/min Intravenous Titrated Rust-Chester, Britton L, NP 22.5 mL/hr at 07/16/2021 2355 6 mcg/min at 07/22/2021 2355   piperacillin-tazobactam (ZOSYN) IVPB 3.375 g  3.375 g Intravenous Q8H Beers, Shanon BrowRPH 12.5 mL/hr at 07/07/21 1252 3.375 g at 07/07/21 1252   propofol (DIPRIVAN) 1000 MG/100ML infusion  5-50 mcg/kg/min Intravenous Titrated Rust-Chester, Britton L, NP 11.63 mL/hr at 07/07/21 1243 20 mcg/kg/min at 07/07/21 1243  Facility-Administered Medications Ordered in Other Encounters  Medication Dose Route Frequency Provider Last Rate Last Admin   midazolam (VERSED) 5 MG/5ML injection   Intravenous Anesthesia Intra-op Cleda Daub, CRNA   2 mg at 02/24/21 1258   Recording Conditions   This is a routine less than one hour EEG recording.  The EEG was performed utilizing standard international 10-20 system of electrode placement, with additional channels monitored for eye movement.  One channel electrocardiogram was monitored.  Data were obtained, stored, and interpreted according to ACNS guidelines (J Clin Neurophysiol 2006;23(2):85-183) utilizing referential  montage recording, with reformatting to longitudinal, transverse bipolar, and referential montages as necessary for interpretation, along with digital/automated EEG analysis.  Patient tolerated entire procedure well.  Photic stimulation and hyperventilation were utilized as activation procedures unless otherwise specified below.  EEG Description   Background Activity: No clear posterior-dominant background activity was present. Continuously suppressed voltage with no definite cerebral activity seen during the recording. Paroxysmal Abnormalities: No paroxysmal abnormalities were noted throughout the recording. Drowsiness/Sleep: No normal sleep architecture appreciated. Stimulation: Hyperventilation and photic stimulation were not performed.  ECG Recording: No ECG abnormalities were noted. Recording Quality: Excessive EMG artifact throughout the entire recording.  EEG Interpretation   This is an abnormal EEG due to: No awake background. Continuously suppressed background. There was no electrographic seizure activity or epileptiform discharges.   Clinical Correlation   This EEG is consistent with a severe encephalopathy but is nonspecific as to etiology. Continuous generalized suppression with lack of reactivity can be seen in the presence of sedative medications. No EEG activity was detected, but this EEG was not performed using a ECI protocol.  There was no seizure activity or epileptiform discharges.    Electronically signed by:  Lynnae Sandhoff, MD Page: [phone] 07/07/2021, 1:46 PM

## 2021-07-07 NOTE — Plan of Care (Signed)
Care plan initiated.

## 2021-07-07 NOTE — Progress Notes (Signed)
Subjective:  CC: Kim Hicks is a 56 y.o. female  Hospital stay day 1,     Cardiac arrest Respiratory failure DKA Ogilvie syndrome  HPI: No acute changes overnight.  Rectal tube continues to decompress  ROS:  Unable to obtain secondary to patient status   Objective:   Temp:  [95.4 F (35.2 C)-100.6 F (38.1 C)] 99.9 F (37.7 C) (02/13 1958) Pulse Rate:  [87-98] 88 (02/13 1700) Resp:  [18-44] 19 (02/13 1700) BP: (96-165)/(40-96) 165/74 (02/13 0351) SpO2:  [95 %-100 %] 100 % (02/13 1957) Arterial Line BP: (104-201)/(51-92) 166/66 (02/13 1900) FiO2 (%):  [35 %-80 %] 35 % (02/13 1957) Weight:  [96.9 kg] 96.9 kg (02/13 0452)     Height: 5' 5"  (165.1 cm) Weight: 96.9 kg BMI (Calculated): 35.55   Intake/Output this shift:   Intake/Output Summary (Last 24 hours) at 07/07/2021 2021 Last data filed at 07/07/2021 1735 Gross per 24 hour  Intake 5099.18 ml  Output 5555 ml  Net -455.82 ml    Constitutional :  Intubated and sedated  Lymphatics/Throat:  no asymmetry, masses, or scars  Respiratory:  clear to auscultation bilaterally  Cardiovascular:  regular rate and rhythm  Gastrointestinal: Improved distention and firmness    LABS:  CMP Latest Ref Rng & Units 07/07/2021 07/07/2021 07/07/2021  Glucose 70 - 99 mg/dL 214(H) 142(H) 225(H)  BUN 6 - 20 mg/dL 23(H) 24(H) 27(H)  Creatinine 0.44 - 1.00 mg/dL 0.59 0.65 0.88  Sodium 135 - 145 mmol/L 138 138 137  Potassium 3.5 - 5.1 mmol/L 3.8 4.1 3.5  Chloride 98 - 111 mmol/L 109 107 107  CO2 22 - 32 mmol/L 24 24 24   Calcium 8.9 - 10.3 mg/dL 7.4(L) 7.6(L) 7.6(L)  Total Protein 6.5 - 8.1 g/dL - 5.7(L) -  Total Bilirubin 0.3 - 1.2 mg/dL - 0.3 -  Alkaline Phos 38 - 126 U/L - 209(H) -  AST 15 - 41 U/L - 714(H) -  ALT 0 - 44 U/L - 489(H) -   CBC Latest Ref Rng & Units 07/07/2021 07/21/2021 06/30/2021  WBC 4.0 - 10.5 K/uL 10.2 12.0(H) 6.0  Hemoglobin 12.0 - 15.0 g/dL 11.4(L) 12.3 12.0  Hematocrit 36.0 - 46.0 % 37.0 44.6 37.8  Platelets 150  - 400 K/uL 127(L) 203 156.0    RADS: N/a Assessment:   Abdominal distention improving with rectal tube decompression.  Clinically still very poor prognosis and surgery will not improver overall clinical picture especially with the improving abdominal exam.  Surgery to sign off for now.  Please call with additional questions  labs/images/medications/previous chart entries reviewed personally and relevant changes/updates noted above.

## 2021-07-07 NOTE — Consult Note (Signed)
Pharmacy Antibiotic Note  Kim Hicks is a 56 y.o. female w/ h/o HTN, T2DM, NASH cirrhosis, & cholecystomy w/ biliary leak admitted with toxic megacolon s/p OOH CA in DKA on mechanical ventilatory support and pressors admitted on 07/09/2021 with aspiration pneumonia & toxic megacolon.  Pharmacy initially consulted for Unasyn transition to Zosyn dosing to broaden for intra-abdominal coverage.  Plan: Transition Unasyn 3 g IV q6h over to Zosyn IV 3.375g q8h Monitor clinical picture and renal function F/U C&S, abx deescalation / LOT   Height: 5' 5"  (165.1 cm) Weight: 96.9 kg (213 lb 10 oz) IBW/kg (Calculated) : 57  Temp (24hrs), Avg:97.2 F (36.2 C), Min:94.9 F (34.9 C), Max:100.6 F (38.1 C)  Recent Labs  Lab 07/21/2021 1543 07/08/2021 1637 07/04/2021 1811 07/03/2021 2145 07/07/21 0008 07/07/21 0016 07/07/21 0448 07/07/21 0730  WBC 12.0*  --   --   --   --   --  10.2  --   CREATININE 1.15* 1.36*  --  1.07*  --  0.88 0.65 0.59  LATICACIDVEN >9.0*  --  7.8*  --  3.5*  --  2.3*  --      Estimated Creatinine Clearance: 90.5 mL/min (by C-G formula based on SCr of 0.59 mg/dL).    Allergies  Allergen Reactions   Dilaudid [Hydromorphone] Hives   Talwin [Pentazocine]     Turned blue around lips and face, rash all over body    Antimicrobials this admission: Unasyn (2/12 >> 2/13); then Zosyn (2/13>>  Dose adjustments this admission: CTM and adjust PRN  Microbiology results: 2/13 GIP negative 2/13 Cdiff Ag/Tox - neg/neg 2/13: MRSA - negative 2/12: RespCx - Sent 2/12: Cov/Flu - negative   Thank you for allowing pharmacy to be a part of this patients care.  [name], PharmD 07/07/2021 12:12 PM

## 2021-07-07 NOTE — Progress Notes (Signed)
°   07/07/21 1400  Clinical Encounter Type  Visited With Patient and family together  Visit Type Initial;Critical Care  Spiritual Encounters  Spiritual Needs Prayer;Grief support  Stress Factors  Family Stress Factors Health changes   Chaplain provided care for husband and sister-in-law of patient who is in critical condition. Family is grieving and Chaplain provided support through compassionate presence, meaningful conversation and prayer.

## 2021-07-07 NOTE — Consult Note (Signed)
PHARMACY CONSULT NOTE - FOLLOW UP  Pharmacy Consult for Electrolyte Monitoring and Replacement   Recent Labs: Potassium (mmol/L)  Date Value  07/07/2021 4.1  12/14/2012 4.0   Magnesium (mg/dL)  Date Value  07/07/2021 4.3 (H)   Calcium (mg/dL)  Date Value  07/07/2021 7.6 (L)   Calcium, Total (mg/dL)  Date Value  12/14/2012 9.3   Albumin (g/dL)  Date Value  07/07/2021 3.1 (L)   Phosphorus (mg/dL)  Date Value  07/07/2021 2.7   Sodium (mmol/L)  Date Value  07/07/2021 138  12/14/2012 137     Assessment: 56yo Female w/ h/o HTN, T2DM, NASH cirrhosis, & cholecystomy w/ biliary leak  admitted with toxic megacolon s/p OOH CA in DKA on mechanical ventilatory support and pressors. Pharmacy consulted for electrolyte mgmt ISO DKA.  On MIVF / insulin gtt per DKA protocol:  + D5LR @125ml /hr + insulin gtt  K: 4.2>4.1 (received 60 meq IV 2/12 PM-2/13 AM) Na: 132>138 (gluc: 412>142)  Goal of Therapy:  Lytes WNL  Plan:  Pt is currently strict NPO ISO of toxic megacolon. Scr 1.07>0.65, AST 78>714 Lytes WNL at this time and not req'ing repletion. CTM and replace PRN with AM labs.  [name], PharmD, [facility] Clinical Pharmacist 07/07/2021 7:41 AM

## 2021-07-07 NOTE — Consult Note (Addendum)
Consultation Note Date: 07/07/2021   Patient Name: Kim Hicks  DOB: 15-Mar-1966  MRN: [id number]  Age / Sex: 56 y.o., female  PCP: [name], [place] Referring Physician: Flora Lipps, MD  Reason for Consultation: Establishing goals of care  HPI/Patient Profile: 56 yo F presenting to [facility] ED from home with her husband via EMS as CPR in progress. (History and events gathered from ED documentation & husband, [name], at bedside) Patient was in her normal state of health until waking up on the morning of 07/21/2021 with significant abdominal distension & LUQ discomfort. She began having dyspnea and her husband stepped out of the room to get a fan at which point he heard her collapse on the living room floor. EMS was called finding the patient down for about 10 minutes when they arrived and initial rhythm was asystole, CPR initiated around 14:45 per EMS report. King airway placed in field. In transport ROSC was achieved briefly before patient again pulseless in asystole.  Patient arrived CPR in progress around 15:30. Transport was estimated at about 45 minutes. ROSC documented at 15:40 shortly after arrival to ED.  Clinical Assessment and Goals of Care: Patient is in bed on ventilator. She is currently being set up for EEG. No family at bedside.   Spoke with husband. He states they have been married 34 years. They have 1 son.   He discusses how hard things have been for her since her lap chole. He discusses procedures and drain changes. He states she was happy to finally have the drain completely removed. He states she broke her foot a few months ago and had just been transitioned to a soft cast.  He states she had been taking medication for constipation prior to this admission, and was having profuse bowel movements. He tells me the distention was noticed only the day before her arrest.   He states with the arrest, his  sister was there in about 5 minutes as she lives next door. His sister started CPR until EMS arrived.   He states he has been advised "she may not be able to go home from the hospital", but is hopeful at this time, she will be able to.   Broached GOC. He states they have never discussed boundaries on acceptable vs unacceptable QOL.     SUMMARY OF RECOMMENDATIONS   Spoke with husband. Working on Software engineer. Will follow along.    Prognosis:  Poor       Primary Diagnoses: Present on Admission:  Toxic megacolon ([place])  Septic shock ([place])  Other cirrhosis of liver ([place])  Acute respiratory failure with hypoxemia ([place])   I have reviewed the medical record, interviewed the patient and family, and examined the patient. The following aspects are pertinent.  Past Medical History:  Diagnosis Date   AKI (acute kidney injury) ([place]) 07/07/2021   Anxiety    Cataract    REMOVED BOTH   Cirrhosis of liver (HCC)    Common migraine with intractable migraine 08/07/2020   Depression  Diabetes mellitus without complication (HCC)    Dysrhythmia    Edema    Family history of adverse reaction to anesthesia    mother had trouble waking up after surgery   Foot fracture, left    Headache    MIGRAINES   Hyperlipidemia    Hypertension    Neuromuscular disorder (HCC)    NEUROPATHY-both hands and feet   On supplemental oxygen by nasal cannula    2L at bedtime only NO CPAP JUST 02 PER PT   Oxygen deficiency    Palpitations    RLS (restless legs syndrome) 02/17/2021   Sleep apnea    Wheezing    Social History   Socioeconomic History   Marital status: Married    Spouse name: Not on file   Number of children: Not on file   Years of education: Not on file   Highest education level: Not on file  Occupational History   Not on file  Tobacco Use   Smoking status: Never   Smokeless tobacco: Never  Vaping Use   Vaping Use: Never used  Substance and Sexual Activity   Alcohol use:  Never   Drug use: Never   Sexual activity: Not Currently  Other Topics Concern   Not on file  Social History Narrative   Lives with niece and husband   Right handed   Drinks 4-5 cups daily   Social Determinants of Health   Financial Resource Strain: Not on file  Food Insecurity: Not on file  Transportation Needs: Not on file  Physical Activity: Not on file  Stress: Not on file  Social Connections: Not on file   Family History  Problem Relation Age of Onset   Diabetes Mother    Heart failure Mother    Depression Mother    Colon cancer Neg Hx    Stomach cancer Neg Hx    Esophageal cancer Neg Hx    Pancreatic cancer Neg Hx    Colon polyps Neg Hx    Rectal cancer Neg Hx    Scheduled Meds:  chlorhexidine gluconate (MEDLINE KIT)  15 mL Mouth Rinse BID   Chlorhexidine Gluconate Cloth  6 each Topical Daily   feeding supplement (VITAL HIGH PROTEIN)  1,000 mL Per Tube Q24H   free water  30 mL Per Tube Q4H   heparin  5,000 Units Subcutaneous Q8H   insulin aspart  2-6 Units Subcutaneous Q4H   insulin detemir  12 Units Subcutaneous Q12H   mouth rinse  15 mL Mouth Rinse 10 times per day   [START ON 07/08/2021] multivitamin  15 mL Per Tube Daily   Continuous Infusions:  sodium chloride Stopped (07/07/2021 1641)   dextrose 5% lactated ringers 125 mL/hr at 07/07/21 1108   famotidine (PEPCID) IV 10 mL/hr at 07/07/21 1108   insulin 5 Units/hr (07/07/21 1108)   lactated ringers Stopped (07/07/21 0045)   levETIRAcetam 500 mg (07/07/21 1240)   norepinephrine (LEVOPHED) Adult infusion 6 mcg/min (07/15/2021 2355)   piperacillin-tazobactam (ZOSYN)  IV     propofol (DIPRIVAN) infusion 20 mcg/kg/min (07/07/21 1243)   PRN Meds:.albuterol, dextrose, fentaNYL (SUBLIMAZE) injection, midazolam Medications Prior to Admission:  Prior to Admission medications   Medication Sig Start Date End Date Taking? Authorizing Provider  ALPRAZolam Duanne Moron) 0.5 MG tablet Take 0.5 mg by mouth 3 (three) times  daily. 08/30/20  Yes [provider]  Calcium Carbonate-Vit D-Min (CALCIUM 1200) 1200-1000 MG-UNIT CHEW Chew 1 tablet by mouth daily.   Yes [provider]  diltiazem (CARDIZEM CD) 120 MG 24 hr capsule Take 120 mg by mouth daily. 05/29/21  Yes [provider]  docusate sodium (COLACE) 100 MG capsule Take 100 mg by mouth 2 (two) times daily.   Yes [provider]  Dulaglutide (TRULICITY) 4.5 MV/7.8IO SOPN Inject 4.5 mg into the skin every Monday.   Yes [provider]  DULoxetine (CYMBALTA) 30 MG capsule Take 30 mg by mouth at bedtime.   Yes [provider]  Empagliflozin-metFORMIN HCl ER (SYNJARDY XR) 25-1000 MG TB24 Take 1 tablet by mouth daily. 11/15/18  Yes [provider]  hydrocortisone (ANUSOL-HC) 25 MG suppository Place 1 suppository (25 mg total) rectally every 12 (twelve) hours. Nightly for 1 week and then every other night until prescription is complete. 06/24/21 06/24/22 Yes Mansouraty, Telford Nab., MD  Insulin Glargine [facility]) 100 UNIT/ML Inject 30 Units into the skin at bedtime. 03/20/20  Yes [provider]  LINZESS 290 MCG CAPS capsule TAKE 1 CAPSULE BY MOUTH DAILY BEFORE BREAKFAST. 02/13/21  Yes [name], MD  lisinopril (ZESTRIL) 40 MG tablet Take 40 mg by mouth every morning. 09/27/20  Yes [provider]  metFORMIN (GLUCOPHAGE) 1000 MG tablet Take 1,000 mg by mouth at bedtime. 04/11/21  Yes [provider]  pantoprazole (PROTONIX) 20 MG tablet Take 20 mg by mouth daily. 06/24/21  Yes [provider]  pregabalin (LYRICA) 100 MG capsule Two capsules in the morning and evening, one at midday Patient taking differently: Take 100-200 mg by mouth See admin instructions. Take 200 mg in the morning, 100 mg at lunch, and 200 mg at bedtime 02/17/21  Yes Kathrynn Ducking, MD  QUEtiapine (SEROQUEL) 400 MG tablet Take 2 tablets (800 mg total) by mouth at bedtime. 02/17/21  Yes Kathrynn Ducking, MD  QUEtiapine (SEROQUEL) 50 MG tablet Take 100 mg by mouth every morning. 08/30/20  Yes [provider]  simvastatin (ZOCOR) 10 MG tablet Take 10 mg by mouth daily. 12/13/17  Yes [provider]  topiramate (TOPAMAX) 50 MG tablet Take 50 mg by mouth 2 (two) times daily.   Yes [provider]  vortioxetine HBr ([name]) 20 MG TABS tablet Take 20 mg by mouth at bedtime.   Yes [provider]  cyclobenzaprine (FLEXERIL) 10 MG tablet Take 1 tablet (10 mg total) by mouth 3 (three) times daily as needed for muscle spasms. 06/27/21   Landis Martins, DPM   Allergies  Allergen Reactions   Dilaudid [Hydromorphone] Hives   Talwin [Pentazocine]     Turned blue around lips and face, rash all over body   Review of Systems  Unable to perform ROS  Physical Exam Constitutional:      Comments: Eyes closed. On ventilator.     Vital Signs: BP (!) 165/74    Pulse 94    Temp (!) 100.4 F (38 C) (Esophageal)    Resp (!) 24    Ht 5' 5"  (1.651 m)    Wt 96.9 kg    SpO2 100%    BMI 35.55 kg/m  Pain Scale: CPOT   Pain Score: 0-No pain   SpO2: SpO2: 100 % O2 Device:SpO2: 100 % O2 Flow Rate: .   IO: Intake/output summary:  Intake/Output Summary (Last 24 hours) at 07/07/2021 1249 Last data filed at 07/07/2021 1108 Gross per 24 hour  Intake 4167.17 ml  Output 4510 ml  Net -342.83 ml    LBM: Last BM Date: 07/05/2021 Baseline Weight: Weight: 105.2 kg Most  recent weight: Weight: 96.9 kg        Time In: 12:00 Time Out: 12:42 Time Total: 42 min Greater than 50%  of this time was spent counseling and coordinating care related to the above assessment and plan.  Signed by: Asencion Gowda, NP   Please contact Palliative Medicine Team phone at [phone] for questions and concerns.  For individual provider: See Shea Evans

## 2021-07-07 NOTE — Progress Notes (Signed)
Neuro: unresponsive. Targeted Temperature Management with [place] maintained normothermia throughout day. MRI of brain scheduled for this evening. EEG performed today, encephalopathy noted.  Resp: Vented. 35%, Peep 8, RR 16, Tidal 450. Cardio: SR GI/GU: Foley with adequate output documented. Flexi seal in place with output documented. Skin: Dry, excoriation on abdomen which is taut and distended. MD believes this may be her baseline.  Psych: Non interactive Events: Vital High protein initiated today at 53m/hr. Goal 55. Increase by 162mq8h. Mri scheduled for tonight at 2030-2100 possibly.

## 2021-07-07 NOTE — Progress Notes (Signed)
NAME:  Kim Hicks, MRN:  [id number], DOB:  12/21/1965, LOS: 1 ADMISSION DATE:  07/02/2021 56 yo F presenting to [facility] ED from home with her husband via EMS as CPR in progress. (History and events gathered from ED documentation & husband, [name], at bedside) Patient was in her normal state of health until waking up on the morning of 07/04/2021 with significant abdominal distension & LUQ discomfort. She began having dyspnea and her husband stepped out of the room to get a fan at which point he heard her collapse on the living room floor. EMS was called finding the patient down for about 10 minutes when they arrived and initial rhythm was asystole, CPR initiated around 14:45 per EMS report. King airway placed in field. In transport ROSC was achieved briefly before patient again pulseless in asystole.  Patient arrived CPR in progress around 15:30. Transport was estimated at about 45 minutes. ED course: ROSC documented at 15:40 shortly after arrival. Patient emergently intubated requiring mechanical ventilatory support. Initial work up revealed DKA with acidosis & CBG > 700, septic protocol initiated with initial lactic >9. Patient noted to have severe abdominal distention firm to touch. CT imaging showing toxic megacolon. Dr. Lysle Pearl from general surgery assessed the patient bedside and attempted manual decompression with minor effect; however patient too unstable for surgery.  Medications given: Unasyn, 2 L NS bolus, insulin drip & levophed drip started with continuous IVF Initial Vitals: afebrile 96, tachypneic 35, NSR 82, BP 98/66 & Spo2 88% on king airway Significant labs: (Labs/ Imaging personally reviewed) I, Domingo Pulse Rust[place], AGACNP-BC, personally viewed and interpreted this ECG. EKG Interpretation: Date: 06/27/2021, EKG Time: 15:52, Rate: 96, Rhythm: 1st degree HB-however abnormal P wave-not consistent/ectopic?, QRS Axis:  LAD Intervals: prolonged PR interval, mildly prolonged QTc, ST/T Wave  abnormalities: non specific T wave inversions, mild STE in III only, Narrative Interpretation: 1st degree HB with abnormal P waves- ectopic? And mildly prolonged QTc Chemistry: Na+:130, K+: 4.8, Cl: 95, BUN/Cr.: 29/1.39, Serum CO2/ AG: 17/18, Mg: 4.9, Lipase: 96, alk phos: 312, AST: 78, ammonia: 96 Hematology: WBC: 12, Hgb: 12.3,  Troponin: 39 > 27, Lactic/ PCT: >9-7.8/ 0.17, COVID-19 & Influenza A/B: negative VBG: 7.17/ 53/ 50/ 19.3 >> 7.20/ 53/ 56/ 20.7 CXR 06/29/2021: no acute cardiopulmonary process. Atelectasis in mid & lower lungs with low lung volumes CT head wo contrast 07/02/2021: no intracranial abnormalities CTa chest 06/26/2021: negative for PE. RLL consolidation, query multiple acute minimally displaced rib fractures, multiple subacute/chronic bilateral rib fractures. Acute minimally displaced sternal fracture.  CT abdomen/pelvis w contrast 07/12/2021: Marked gas & fluid dilatation of the large bowel measuring up to 10 cm. No transition point identified. Ogilvie syndrome cannot be fully excluded. Cirrhosis with portal hypertensionand marked hepatic parenchyma heterogeneity suggestive of underlying mass lesion. Cholelithiasis.   Of note in chart review patient had recent elective ERCP for biliary stent removal/exchange for previous biliary leak without complication.   PCCM consulted for admission in the setting of out of hospital cardiac arrest requiring mechanical ventilatory support and toxic megacolon.   Pertinent  Medical History  NASH cirrhosis Type 2 Diabetes Mellitus Pancreatitis (06/2020) Iron deficiency anemia Cholecystectomy Chronic constipation HTN OSA on oxygen overnight (unclear how much/what kind per husband) HLD LEFT foot fracture Anxiety & depression IBS   Significant Hospital Events: Including procedures, antibiotic start and stop dates in addition to other pertinent events   07/16/2021: Admit to ICU with toxic megacolon s/p out of hospital cardiac arrest in DKA on  mechanical ventilatory support and pressors 2/13 remains on vent, shock, on insulin   Antimicrobials:   Antibiotics Given (last 72 hours)     Date/Time Action Medication Dose Rate   07/05/2021 1802 New Bag/Given   Ampicillin-Sulbactam (UNASYN) 3 g in sodium chloride 0.9 % 100 mL IVPB 3 g 200 mL/hr   07/07/21 0041 New Bag/Given   Ampicillin-Sulbactam (UNASYN) 3 g in sodium chloride 0.9 % 100 mL IVPB 3 g 200 mL/hr   07/07/21 0545 New Bag/Given   Ampicillin-Sulbactam (UNASYN) 3 g in sodium chloride 0.9 % 100 mL IVPB 3 g 200 mL/hr            Interim History / Subjective:  Remains intubated Remains critically ill Remains on vent  Vent Mode: PRVC FiO2 (%):  [50 %-100 %] 50 % Set Rate:  [18 bmp-22 bmp] 22 bmp Vt Set:  [400 mL-450 mL] 450 mL PEEP:  [8 cmH20] 8 cmH20        Objective   Blood pressure (!) 165/74, pulse 92, temperature (!) 100.4 F (38 C), resp. rate (!) 24, height _0  (1.651 m), weight 96.9 kg, SpO2 100 %. CVP:  [3 mmHg-4 mmHg] 3 mmHg  Vent Mode: PRVC FiO2 (%):  [50 %-100 %] 50 % Set Rate:  [18 bmp-22 bmp] 22 bmp Vt Set:  [400 mL-450 mL] 450 mL PEEP:  [8 cmH20] 8 cmH20   Intake/Output Summary (Last 24 hours) at 07/07/2021 7078 Last data filed at 07/07/2021 0600 Gross per 24 hour  Intake 3329.51 ml  Output 4370 ml  Net -1040.49 ml   Filed Weights   07/18/2021 1546 07/07/21 0452  Weight: 105.2 kg 96.9 kg      REVIEW OF SYSTEMS  PATIENT IS UNABLE TO PROVIDE COMPLETE REVIEW OF SYSTEMS DUE TO SEVERE CRITICAL ILLNESS AND TOXIC METABOLIC ENCEPHALOPATHY  ALL OTHER ROS ARE NEGATIVE   PHYSICAL EXAMINATION:  GENERAL:critically ill appearing, +resp distress EYES: Pupils equal, round, reactive to light.  No scleral icterus.  MOUTH: Moist mucosal membrane. INTUBATED NECK: Supple.  PULMONARY: +rhonchi, +wheezing CARDIOVASCULAR: S1 and S2.  No murmurs  GASTROINTESTINAL: Soft, nontender, -distended. Positive bowel sounds.  MUSCULOSKELETAL: No  swelling, clubbing, or edema.  NEUROLOGIC: obtunded SKIN:intact,warm,dry    Labs/imaging that I havepersonally reviewed  (right click and "Reselect all SmartList Selections" daily)      ASSESSMENT AND PLAN SYNOPSIS  56 yo white female with acute and severe resp failure with acute sudden cardiac arrest due leading to acute and severe hypoxic pres failure due to demand ischemia from severe metabolic acidosis with DKA with underlying toxic megacolon and aspiration pneumonia with likely anoxic brain injury    Severe ACUTE Hypoxic and Hypercapnic Respiratory Failure -continue Mechanical Ventilator support -continue Bronchodilator Therapy -Wean Fio2 and PEEP as tolerated -VAP/VENT bundle implementation -will NOT perform SAT/SBT when respiratory parameters are met  Vent Mode: PRVC FiO2 (%):  [50 %-100 %] 50 % Set Rate:  [18 bmp-22 bmp] 22 bmp Vt Set:  [400 mL-450 mL] 450 mL PEEP:  [8 cmH20] 8 cmH20  CARDIAC FAILURE-demand ischemia S/p cardiac arrest Continue TTM normothermia protocol -oxygen as needed -Lasix as tolerated -follow up cardiac enzymes as indicated   CARDIAC ICU monitoring   ACUTE KIDNEY INJURY/Renal Failure -continue Foley Catheter-assess need -Avoid nephrotoxic agents -Follow urine output, BMP -Ensure adequate renal perfusion, optimize oxygenation -Renal dose medications   Intake/Output Summary (Last 24 hours) at 07/07/2021 0728 Last data filed at 07/07/2021 0600 Gross per 24 hour  Intake 3329.51 ml  Output 4370 ml  Net -1040.49 ml     NEUROLOGY Acute toxic metabolic encephalopathy Wean sedation and assess neuro status   CARDIOGENIC/SEPTIC SHOCK SOURCE- -use vasopressors to keep MAP>65 as needed -follow ABG and LA -follow up cultures -emperic ABX  INFECTIOUS DISEASE -continue antibiotics as prescribed -follow up cultures  ENDO-DKA - ICU hypoglycemic\Hyperglycemia protocol -check FSBS per protocol   GI GI PROPHYLAXIS as  indicated  NUTRITIONAL STATUS DIET-->NPO Constipation protocol as indicated   ELECTROLYTES -follow labs as needed -replace as needed -pharmacy consultation and following     Best practice (right click and "Reselect all SmartList Selections" daily)  Diet:  NPO Pain/Anxiety/Delirium protocol (if indicated): Yes (RASS goal 0) VAP protocol (if indicated): Yes DVT prophylaxis: Subcutaneous Heparin GI prophylaxis: PPI Glucose control:  Insulin gtt Central venous access:  Yes, and it is still needed Arterial line:  Yes, and it is still needed Foley:  Yes, and it is still needed Mobility:  bed rest  Code Status:  FULL CODE Disposition: ICU  Labs   CBC: Recent Labs  Lab 06/30/21 1109 07/21/2021 1543 07/07/21 0448  WBC 6.0 12.0* 10.2  NEUTROABS 3.9 3.8  --   HGB 12.0 12.3 11.4*  HCT 37.8 44.6 37.0  MCV 86.9 99.8 90.0  PLT 156.0 203 127*    Basic Metabolic Panel: Recent Labs  Lab 07/21/2021 1543 06/26/2021 1606 07/20/2021 1637 07/20/2021 2145 07/07/21 0016 07/07/21 0448  NA 130*  --  130* 132* 137 138  K 4.5  --  4.8 4.2 3.5 4.1  CL 95*  --  95* 102 107 107  CO2 17*  --  17* 21* 24 24  GLUCOSE 709*  --  761* 412* 225* 142*  BUN 27*  --  29* 30* 27* 24*  CREATININE 1.15*  --  1.36* 1.07* 0.88 0.65  CALCIUM 8.7*  --  8.5* 8.1* 7.6* 7.6*  MG  --  4.9*  --   --   --  4.3*  PHOS  --   --   --  4.0  --  2.7   GFR: Estimated Creatinine Clearance: 90.5 mL/min (by C-G formula based on SCr of 0.65 mg/dL). Recent Labs  Lab 06/30/21 1109 07/17/2021 1543 07/01/2021 1606 07/09/2021 1811 07/07/21 0008 07/07/21 0448  PROCALCITON  --   --  0.17  --   --  15.52  WBC 6.0 12.0*  --   --   --  10.2  LATICACIDVEN  --  >9.0*  --  7.8* 3.5* 2.3*    Liver Function Tests: Recent Labs  Lab 06/30/21 1109 07/01/2021 1543 07/07/21 0448  AST 33 78* 714*  ALT 27 44 489*  ALKPHOS 167* 312* 209*  BILITOT 0.3 0.7 0.3  PROT 7.7 7.4 5.7*  ALBUMIN 4.3 3.7 3.1*   Recent Labs  Lab  06/30/21 1109 07/03/2021 1606 07/07/21 0448  LIPASE 31.0 96* 34  AMYLASE 23*  --  63   Recent Labs  Lab 07/04/2021 1606 07/07/21 0448  AMMONIA 96* 47*    ABG    Component Value Date/Time   PHART 7.37 07/07/2021 0000   PCO2ART 43 07/07/2021 0000   PO2ART 200 (H) 07/07/2021 0000   HCO3 24.9 07/07/2021 0000   ACIDBASEDEF 0.5 07/07/2021 0000   O2SAT 99.7 07/07/2021 0000     Coagulation Profile: Recent Labs  Lab 07/21/2021 1543  INR 1.2    Cardiac Enzymes: No results for input(s): CKTOTAL, CKMB, CKMBINDEX, TROPONINI in the last 168 hours.  HbA1C: Hgb A1c  MFr Bld  Date/Time Value Ref Range Status  09/30/2020 04:22 PM 9.5 (H) 4.8 - 5.6 % Final    Comment:    (NOTE) Pre diabetes:          5.7%-6.4%  Diabetes:              >6.4%  Glycemic control for   <7.0% adults with diabetes   06/27/2020 05:00 PM 8.9 (H) 4.8 - 5.6 % Final    Comment:    (NOTE) Pre diabetes:          5.7%-6.4%  Diabetes:              >6.4%  Glycemic control for   <7.0% adults with diabetes     CBG: Recent Labs  Lab 07/07/21 0243 07/07/21 0346 07/07/21 0457 07/07/21 0616 07/07/21 0719  GLUCAP 202* 225* 147* 147* 137*    Allergies Allergies  Allergen Reactions   Dilaudid [Hydromorphone] Hives   Talwin [Pentazocine]     Turned blue around lips and face, rash all over body       DVT/GI PRX  assessed I Assessed the need for Labs I Assessed the need for Foley I Assessed the need for Central Venous Line Family Discussion when available I Assessed the need for Mobilization I made an Assessment of medications to be adjusted accordingly Safety Risk assessment completed  CASE DISCUSSED IN MULTIDISCIPLINARY ROUNDS WITH ICU TEAM     Critical Care Time devoted to patient care services described in this note is 55 minutes.  Critical care was necessary to treat or prevent imminent or life-threatening deterioration.   PATIENT WITH VERY POOR PROGNOSIS I ANTICIPATE PROLONGED ICU  LOS  Patient with Multiorgan failure and at high risk for cardiac arrest and death.    Corrin Parker, M.D.  [facility]  Medical Director [place] Director [facility] Cardio-Pulmonary Department

## 2021-07-07 NOTE — Progress Notes (Signed)
Inpatient Diabetes Program Recommendations  AACE/ADA: New Consensus Statement on Inpatient Glycemic Control (2015)  Target Ranges:  Prepandial:   less than 140 mg/dL      Peak postprandial:   less than 180 mg/dL (1-2 hours)      Critically ill patients:  140 - 180 mg/dL    Latest Reference Range & Units 07/19/2021 15:43  Sodium 135 - 145 mmol/L 130 (L)  Potassium 3.5 - 5.1 mmol/L 4.5  Chloride 98 - 111 mmol/L 95 (L)  CO2 22 - 32 mmol/L 17 (L)  Glucose 70 - 99 mg/dL 709 (HH)  BUN 6 - 20 mg/dL 27 (H)  Creatinine 0.44 - 1.00 mg/dL 1.15 (H)  Calcium 8.9 - 10.3 mg/dL 8.7 (L)  Anion gap 5 - 15  18 (H)    Latest Reference Range & Units 07/14/2021 20:42 06/25/2021 21:40 07/12/2021 23:04 07/07/21 00:31 07/07/21 01:40 07/07/21 02:43 07/07/21 03:46 07/07/21 04:57  Glucose-Capillary 70 - 99 mg/dL 437 (H)  IV Insulin Drip infusing 353 (H) 356 (H) 221 (H) 203 (H) 202 (H) 225 (H) 147 (H)    Admit with: Toxic megacolon s/p out of hospital cardiac arrest in DKA   History: DM2, NASH Cirrhosis  Home DM Meds: Trulicity 4.5 mg Qweek        Synjardy 25/1000 mg Daily        Basaglar 30 units QHS          Metformin 1000 mg QHS  Current Orders: IV Insulin Drip    MD- Note 4:48am BMET shows the following: Glucose 142 Anion Gap 7 CO2 level 24  When you allow pt to transition to SQ Insulin, please consider transitioning to Phase 3 of the ICU Glycemic Control Protocol: Levemir 12 units BID (make sure to continue IV Insulin Drip for 2 hours after 1st dose Levemir on board) Novolog 2-4-6 units Q4 hours (Standard scale per the ICU Glycemic Control Protocol)     --Will follow patient during hospitalization--  Wyn Quaker RN, MSN, CDE Diabetes Coordinator Inpatient Glycemic Control Team Team Pager: [phone] (8a-5p)

## 2021-07-07 NOTE — Progress Notes (Signed)
Initial Nutrition Assessment  DOCUMENTATION CODES:   Obesity unspecified  INTERVENTION:   Vital HP @ 71m/hr- Initiate at trickle rate, once tolerating, increase by 152mhr q 8 hours until goal rate is reached.   Free water flushes 3019m4 hours to maintain tube patency   Regimen provides 1320kcal/day, 116g/day protein and 1284m54my of free water   Liquid MVI daily via tube   Pt at moderate refeed risk; recommend monitor potassium, magnesium and phosphorus labs daily until stable  NUTRITION DIAGNOSIS:   Inadequate oral intake related to inability to eat (pt sedated and ventilated) as evidenced by NPO status.  GOAL:   Provide needs based on ASPEN/SCCM guidelines  MONITOR:   Vent status, Labs, Weight trends, TF tolerance, Skin, I & O's  REASON FOR ASSESSMENT:   Ventilator    ASSESSMENT:   55 y80 female with h/o NASH cirrhosis, DM, HLD, MDD, HTN, GERD, IBS, pancreatitis, OSA and recent left foot fracture who is admitted with cardiac arrest secondary to toxic megacolon.  Pt sedated and ventilated. OGT in place. Will plan to start trickle tube feeds today. Pt with mildly distended abdomen; this is improved per RN report. OGT currently to LIS with 250ml33mput. Pt's husband at bedside reports pt with good appetite and oral intake at baseline; he reports pt was eating well up until the day before her admission. Per chart, pt appears to be up from her UBW. Plan today is for EEG, echo and to wean from propofol for neurological assessment.   Medications reviewed and include: heparin, insulin, unasyn, LRS w/ 5% dextrose @125ml /hr, pepcid, propofol   Labs reviewed: K 3.8 wnl, BUN 23(H), P 2.7 wnl, Mg 4.3(H), AST 714(H), ALT 489(H), ammonia 47(H)  Patient is currently intubated on ventilator support MV: 12.2 L/min Temp (24hrs), Avg:97.1 F (36.2 C), Min:94.9 F (34.9 C), Max:100.4 F (38 C)  Propofol: 11.63 ml/hr- provides 307kcal/day   MAP- >65mmH51mUOP- 3020ml  45mTRITION - FOCUSED PHYSICAL EXAM:  Flowsheet Row Most Recent Value  Orbital Region No depletion  Upper Arm Region No depletion  Thoracic and Lumbar Region No depletion  Buccal Region No depletion  Temple Region No depletion  Clavicle Bone Region No depletion  Clavicle and Acromion Bone Region No depletion  Scapular Bone Region No depletion  Dorsal Hand No depletion  Patellar Region No depletion  Anterior Thigh Region No depletion  Posterior Calf Region No depletion  Edema (RD Assessment) None  Hair Reviewed  Eyes Reviewed  Mouth Reviewed  Skin Reviewed  Nails Reviewed   Diet Order:   Diet Order             Diet NPO time specified  Diet effective now                  EDUCATION NEEDS:   No education needs have been identified at this time  Skin:  Skin Assessment: Reviewed RN Assessment  Last BM:  2/13- 130ml vi39mctal tube  Height:   Ht Readings from Last 1 Encounters:  07/07/21 5' 5"  (1.651 m)    Weight:   Wt Readings from Last 1 Encounters:  07/07/21 96.9 kg    Ideal Body Weight:  56.8 kg  BMI:  Body mass index is 35.55 kg/m.  Estimated Nutritional Needs:   Kcal:  1065-1356kcal/day  Protein:  >115g/day  Fluid:  1.7-2.0L/day  Kim Hicks CaKoleen Distance LDN Please refer to AMION foSurgicenter Of [facility] LLCand/or RD on-call/weekend/after hours pager

## 2021-07-07 NOTE — Consult Note (Signed)
Neurology Consult H&P  Kim Hicks MR# [id number] 07/08/2021  CC: asystole and up to 55 minutes until ROSC  History is obtained from: Husband and chart.  HPI: Kim Hicks is a 56 y.o. female PMHx as reviewed below with out of hospital asystole cardiac arrest and possible downtime between 45-55 minutes. Husband stated that she developed left lateral side pain and fell face forward. He turned her on her side and he called his daughter who is a nurse who told him to start chest compressions while EMS was called.  There appeared to be myoclonic activity and she was loaded with LEV and continued 588m bid maintenance.   RASS: -5, spontaneous eye opening not consistent with stimuli and what appeared to be myoclonic activity. Loaded with Keppra IV, followed by 5041mBID.   ROS: A complete ROS was performed and is negative except as noted in the HPI.   Past Medical History:  Diagnosis Date   AKI (acute kidney injury) ([place]2/13/2023   Anxiety    Cataract    REMOVED BOTH   Cirrhosis of liver (HCC)    Common migraine with intractable migraine 08/07/2020   Depression    Diabetes mellitus without complication ([place]   Dysrhythmia    Edema    Family history of adverse reaction to anesthesia    mother had trouble waking up after surgery   Foot fracture, left    Headache    MIGRAINES   Hyperlipidemia    Hypertension    Neuromuscular disorder (HCC)    NEUROPATHY-both hands and feet   On supplemental oxygen by nasal cannula    2L at bedtime only NO CPAP JUST 02 PER PT   Oxygen deficiency    Palpitations    RLS (restless legs syndrome) 02/17/2021   Sleep apnea    Wheezing    Family History  Problem Relation Age of Onset   Diabetes Mother    Heart failure Mother    Depression Mother    Colon cancer Neg Hx    Stomach cancer Neg Hx    Esophageal cancer Neg Hx    Pancreatic cancer Neg Hx    Colon polyps Neg Hx    Rectal cancer Neg Hx    Social History:  reports that she has  never smoked. She has never used smokeless tobacco. She reports that she does not drink alcohol and does not use drugs.  Prior to Admission medications   Medication Sig Start Date End Date Taking? Authorizing Provider  ALPRAZolam (XDuanne Moron0.5 MG tablet Take 0.5 mg by mouth 3 (three) times daily. 08/30/20  Yes [provider]  Calcium Carbonate-Vit D-Min (CALCIUM 1200) 1200-1000 MG-UNIT CHEW Chew 1 tablet by mouth daily.   Yes [provider]  diltiazem (CARDIZEM CD) 120 MG 24 hr capsule Take 120 mg by mouth daily. 05/29/21  Yes [provider]  docusate sodium (COLACE) 100 MG capsule Take 100 mg by mouth 2 (two) times daily.   Yes [provider]  Dulaglutide (TRULICITY) 4.5 MGEL/9.5VUOPN Inject 4.5 mg into the skin every Monday.   Yes [provider]  DULoxetine (CYMBALTA) 30 MG capsule Take 30 mg by mouth at bedtime.   Yes [provider]  Empagliflozin-metFORMIN HCl ER (SYNJARDY XR) 25-1000 MG TB24 Take 1 tablet by mouth daily. 11/15/18  Yes [provider]  hydrocortisone (ANUSOL-HC) 25 MG suppository Place 1 suppository (25 mg total) rectally every 12 (twelve) hours. Nightly for 1 week and then every  other night until prescription is complete. 06/24/21 06/24/22 Yes Mansouraty, Telford Nab., MD  Insulin Glargine [facility]) 100 UNIT/ML Inject 30 Units into the skin at bedtime. 03/20/20  Yes [provider]  LINZESS 290 MCG CAPS capsule TAKE 1 CAPSULE BY MOUTH DAILY BEFORE BREAKFAST. 02/13/21  Yes [name], MD  lisinopril (ZESTRIL) 40 MG tablet Take 40 mg by mouth every morning. 09/27/20  Yes [provider]  metFORMIN (GLUCOPHAGE) 1000 MG tablet Take 1,000 mg by mouth at bedtime. 04/11/21  Yes [provider]  pantoprazole (PROTONIX) 20 MG tablet Take 20 mg by mouth daily. 06/24/21  Yes [provider]  pregabalin (LYRICA) 100 MG capsule Two capsules in the morning and evening, one at  midday Patient taking differently: Take 100-200 mg by mouth See admin instructions. Take 200 mg in the morning, 100 mg at lunch, and 200 mg at bedtime 02/17/21  Yes Kathrynn Ducking, MD  QUEtiapine (SEROQUEL) 400 MG tablet Take 2 tablets (800 mg total) by mouth at bedtime. 02/17/21  Yes Kathrynn Ducking, MD  QUEtiapine (SEROQUEL) 50 MG tablet Take 100 mg by mouth every morning. 08/30/20  Yes [provider]  simvastatin (ZOCOR) 10 MG tablet Take 10 mg by mouth daily. 12/13/17  Yes [provider]  topiramate (TOPAMAX) 50 MG tablet Take 50 mg by mouth 2 (two) times daily.   Yes [provider]  vortioxetine HBr ([name]) 20 MG TABS tablet Take 20 mg by mouth at bedtime.   Yes [provider]  cyclobenzaprine (FLEXERIL) 10 MG tablet Take 1 tablet (10 mg total) by mouth 3 (three) times daily as needed for muscle spasms. 06/27/21   Landis Martins, DPM    Exam: Current vital signs: BP (!) 152/74    Pulse 80    Temp 98.8 F (37.1 C) (Esophageal)    Resp 20    Ht 5' 5"  (1.651 m)    Wt 102.3 kg    SpO2 100%    BMI 37.53 kg/m   Physical Exam  Constitutional: Appears well-developed and well-nourished.  Psych: unable to assess due to encephalopathy and intubated. Eyes: No scleral injection HENT: No OP obstruction. Head: Normocephalic.  Cardiovascular: Normal rate and regular rhythm.  Respiratory: Effort normal, symmetric excursions bilaterally, no audible wheezing/cuff leak. GI: Soft.  No distension. There is no tenderness.  Skin: WDI  Neuro: Mental Status: comatose on low dose midazolam. No blink to confrontation.  Pupils are equal, round, sluggishly reactive to light. No corneal reflexes. No cough reflex. No gag reflex. Tone is normal. Bulk is normal.  No spontaneous movement in extremities. No withdrawal to noxious stimulation. Deep Tendon Reflexes: Mute Toes are mute. Coordination unable to assess due to encephalopathy and intubated. Gait -  Deferred  GCS: E(1) V(NT) M(1)  I have reviewed labs in epic and the pertinent results are: AST 1,215 ALT 885 Lab Results  Component Value Date   HGBA1C 9.9 (H) 07/07/2021    I have reviewed the images obtained: NCT head showed did not show ischemic changes, hemorrhage or masses.  MRI brain showed diffuse cortical restricted diffusion in both hemispheres, predominantly the posterior hemispheres, cerebellum. There is also abnormal signal within the caudate nuclei.  Routine EEG showed no awake background, continuous generalized suppression consistent with a severe encephalopathy. No electrographic seizure activity or epileptiform discharges.    Assessment: Kim Hicks is a 56 y.o. female PMHx as noted above with asystole and up to 55 minutes until ROSC. Initial  assessment documented impaired corneal blink reflexes, no cough or gag. Today she is on very low dose midazolam and her exam did not show corneal blink reflexes, cough or gag reflex. There was no voluntary movement and no response to noxious stimuli. EEG showed continuous generalized suppression consistent with a severe encephalopathy and MRI revealed  diffuse cortical restricted diffusion mostly in posterior hemispheres and cerebellum. The history, exam and ancillary testing portends a very poor prognosis for any meaningful recovery. Discussed the case and findings with Dr. Mortimer Fries, then I had a long discussion with her family who was at bedside and her husband stated he is not ready to give up.    Impression:  Severe hypoxic-ischemic encephalopathy. Cardiac arrest Comatose GCS: E(1) V(NT) M(1) Mechanically ventilated   Plan: - Continue levetiracetam 533m two times daily for presumed myoclonic activity. - Continue supportive measures per CCM. - Neurology will remain available, please call for questions.  This patient is critically ill and at significant risk of neurological worsening, death and care requires constant  monitoring of vital signs, hemodynamics,respiratory and cardiac monitoring, neurological assessment, discussion with family, other specialists and medical decision making of high complexity. I spent 120 minutes of neurocritical care time  in the care of  this patient. This was time spent independent of any time provided by nurse practitioner or PA.  Electronically signed by:  HLynnae Sandhoff MD Page: [id number]/14/2023, 10:29 AM  If 7pm- 7am, please page neurology on call as listed in [place]

## 2021-07-07 NOTE — Progress Notes (Signed)
*  PRELIMINARY RESULTS* Echocardiogram 2D Echocardiogram has been performed.  Sherrie Sport 07/07/2021, 10:53 AM

## 2021-07-07 NOTE — Progress Notes (Signed)
GOALS OF CARE DISCUSSION  The Clinical status was relayed to family in detail. Husband at bedside  Updated and notified of patients medical condition.    Patient remains unresponsive and will not open eyes to command.   Patient is having a weak cough and struggling to remove secretions.   Patient with increased WOB and using accessory muscles to breathe Explained to family course of therapy and the modalities    Patient with Progressive multiorgan failure with a very high probablity of a very minimal chance of meaningful recovery despite all aggressive and optimal medical therapy.  PATIENT REMAINS FULL CODE  Family understands the situation.  Family are satisfied with Plan of action and management. All questions answered  Additional CC time 20 mins   Clariece Roesler Patricia Pesa, M.D.  [facility]  Medical Director [place] Director [facility] Cardio-Pulmonary Department

## 2021-07-07 NOTE — Progress Notes (Signed)
Eeg done 

## 2021-07-08 ENCOUNTER — Inpatient Hospital Stay: Payer: Medicare Other

## 2021-07-08 ENCOUNTER — Ambulatory Visit: Payer: Medicare Other | Admitting: Neurology

## 2021-07-08 DIAGNOSIS — I469 Cardiac arrest, cause unspecified: Secondary | ICD-10-CM | POA: Diagnosis not present

## 2021-07-08 DIAGNOSIS — Z7189 Other specified counseling: Secondary | ICD-10-CM | POA: Diagnosis not present

## 2021-07-08 DIAGNOSIS — G9340 Encephalopathy, unspecified: Secondary | ICD-10-CM

## 2021-07-08 DIAGNOSIS — R402444 Other coma, without documented Glasgow coma scale score, or with partial score reported, 24 hours or more after hospital admission: Secondary | ICD-10-CM | POA: Diagnosis not present

## 2021-07-08 DIAGNOSIS — Z978 Presence of other specified devices: Secondary | ICD-10-CM

## 2021-07-08 DIAGNOSIS — Z9911 Dependence on respirator [ventilator] status: Secondary | ICD-10-CM

## 2021-07-08 DIAGNOSIS — K5931 Toxic megacolon: Secondary | ICD-10-CM | POA: Diagnosis not present

## 2021-07-08 LAB — COMPREHENSIVE METABOLIC PANEL
ALT: 885 U/L — ABNORMAL HIGH (ref 0–44)
AST: 1215 U/L — ABNORMAL HIGH (ref 15–41)
Albumin: 3 g/dL — ABNORMAL LOW (ref 3.5–5.0)
Alkaline Phosphatase: 164 U/L — ABNORMAL HIGH (ref 38–126)
Anion gap: 6 (ref 5–15)
BUN: 24 mg/dL — ABNORMAL HIGH (ref 6–20)
CO2: 24 mmol/L (ref 22–32)
Calcium: 7.8 mg/dL — ABNORMAL LOW (ref 8.9–10.3)
Chloride: 110 mmol/L (ref 98–111)
Creatinine, Ser: 0.64 mg/dL (ref 0.44–1.00)
GFR, Estimated: >60 mL/min (ref >60)
Glucose, Bld: 197 mg/dL — ABNORMAL HIGH (ref 70–99)
Potassium: 3.7 mmol/L (ref 3.5–5.1)
Sodium: 140 mmol/L (ref 135–145)
Total Bilirubin: 0.5 mg/dL (ref 0.3–1.2)
Total Protein: 5.9 g/dL — ABNORMAL LOW (ref 6.5–8.1)

## 2021-07-08 LAB — CBC
HCT: 33.7 % — ABNORMAL LOW (ref 36.0–46.0)
Hemoglobin: 10.3 g/dL — ABNORMAL LOW (ref 12.0–15.0)
MCH: 28 pg (ref 26.0–34.0)
MCHC: 30.6 g/dL (ref 30.0–36.0)
MCV: 91.6 fL (ref 80.0–100.0)
Platelets: 119 10*3/uL — ABNORMAL LOW (ref 150–400)
RBC: 3.68 MIL/uL — ABNORMAL LOW (ref 3.87–5.11)
RDW: 14.6 % (ref 11.5–15.5)
WBC: 9.4 10*3/uL (ref 4.0–10.5)
nRBC: 0 % (ref 0.0–0.2)

## 2021-07-08 LAB — GLUCOSE, CAPILLARY
Glucose-Capillary: 102 mg/dL — ABNORMAL HIGH (ref 70–99)
Glucose-Capillary: 119 mg/dL — ABNORMAL HIGH (ref 70–99)
Glucose-Capillary: 131 mg/dL — ABNORMAL HIGH (ref 70–99)
Glucose-Capillary: 153 mg/dL — ABNORMAL HIGH (ref 70–99)
Glucose-Capillary: 164 mg/dL — ABNORMAL HIGH (ref 70–99)
Glucose-Capillary: 196 mg/dL — ABNORMAL HIGH (ref 70–99)

## 2021-07-08 LAB — TRIGLYCERIDES: Triglycerides: 506 mg/dL — ABNORMAL HIGH (ref <150)

## 2021-07-08 LAB — PROCALCITONIN: Procalcitonin: 8.6 ng/mL

## 2021-07-08 LAB — PHOSPHORUS: Phosphorus: 3.5 mg/dL (ref 2.5–4.6)

## 2021-07-08 LAB — MAGNESIUM: Magnesium: 3.1 mg/dL — ABNORMAL HIGH (ref 1.7–2.4)

## 2021-07-08 MED ORDER — MORPHINE SULFATE (PF) 2 MG/ML IV SOLN: 2.0000 mg | Freq: Once | INTRAVENOUS | Status: DC

## 2021-07-08 MED ORDER — NICARDIPINE HCL IN NACL 20-0.86 MG/200ML-% IV SOLN
3.0000 mg/h | INTRAVENOUS | Status: DC
  Administered 2021-07-08: 5 mg/h via INTRAVENOUS
  Administered 2021-07-08: 15 mg/h via INTRAVENOUS
  Administered 2021-07-08: 12.5 mg/h via INTRAVENOUS
  Administered 2021-07-09 (×8): 10 mg/h via INTRAVENOUS
  Administered 2021-07-09: 7.5 mg/h via INTRAVENOUS
  Administered 2021-07-10 (×3): 10 mg/h via INTRAVENOUS
  Filled 2021-07-08 (×19): qty 200

## 2021-07-08 MED ORDER — HYDRALAZINE HCL 20 MG/ML IJ SOLN
INTRAMUSCULAR | Status: AC
  Filled 2021-07-08: qty 1

## 2021-07-08 MED ORDER — VECURONIUM BROMIDE 10 MG IV SOLR
10.0000 mg | Freq: Once | INTRAVENOUS | Status: AC
  Administered 2021-07-08: 10 mg via INTRAVENOUS
  Filled 2021-07-08: qty 10

## 2021-07-08 MED ORDER — MIDAZOLAM-SODIUM CHLORIDE 100-0.9 MG/100ML-% IV SOLN
0.5000 mg/h | INTRAVENOUS | Status: DC
  Administered 2021-07-08: 0.5 mg/h via INTRAVENOUS
  Administered 2021-07-09 – 2021-07-10 (×2): 4 mg/h via INTRAVENOUS
  Filled 2021-07-08 (×3): qty 100

## 2021-07-08 MED ORDER — MIDAZOLAM HCL 2 MG/2ML IJ SOLN
2.0000 mg | Freq: Once | INTRAMUSCULAR | Status: AC
  Administered 2021-07-08: 2 mg via INTRAVENOUS

## 2021-07-08 MED ORDER — HYDRALAZINE HCL 20 MG/ML IJ SOLN
10.0000 mg | INTRAMUSCULAR | Status: DC | PRN
  Administered 2021-07-08 (×3): 20 mg via INTRAVENOUS
  Filled 2021-07-08 (×2): qty 1

## 2021-07-08 MED ORDER — FENTANYL CITRATE (PF) 100 MCG/2ML IJ SOLN
100.0000 ug | Freq: Once | INTRAMUSCULAR | Status: AC
  Administered 2021-07-08: 100 ug via INTRAVENOUS
  Filled 2021-07-08: qty 2

## 2021-07-08 NOTE — Progress Notes (Signed)
GOALS OF CARE DISCUSSION  The Clinical status was relayed to family in detail- Husband and Sister at bedside  Updated and notified of patients medical condition- Patient remains unresponsive and will not open eyes to command.   Patient is having a weak cough and struggling to remove secretions.   Patient with increased WOB and using accessory muscles to breathe Explained to family course of therapy and the modalities  Severe Brain Damage, Anoxia s/p cardiac arrest Very low meaningful chance of meaningful recovery    Patient with Progressive multiorgan failure with a very high probablity of a very minimal chance of meaningful recovery despite all aggressive and optimal medical therapy.   Family understands the situation.  They have consented and agreed to DNR status  Family are satisfied with Plan of action and management. All questions answered  Additional CC time 25 mins   Donice Alperin Patricia Pesa, M.D.  [facility]  Medical Director [place] Director [facility] Cardio-Pulmonary Department

## 2021-07-08 NOTE — Consult Note (Signed)
[place] for Electrolyte Monitoring and Replacement   Recent Labs: Potassium (mmol/L)  Date Value  07/08/2021 3.7  12/14/2012 4.0   Magnesium (mg/dL)  Date Value  07/08/2021 3.1 (H)   Calcium (mg/dL)  Date Value  07/08/2021 7.8 (L)   Calcium, Total (mg/dL)  Date Value  12/14/2012 9.3   Albumin (g/dL)  Date Value  07/08/2021 3.0 (L)   Phosphorus (mg/dL)  Date Value  07/08/2021 3.5   Sodium (mmol/L)  Date Value  07/08/2021 140  12/14/2012 137   Corrected Ca: 8.6 mg/dL  Assessment: 56yo Female w/ h/o HTN, T2DM, NASH cirrhosis, & cholecystomy w/ biliary leak  admitted with toxic megacolon s/p OOH CA in DKA on mechanical ventilatory support and pressors. Pharmacy consulted for electrolyte mgmt ISO DKA.  On MIVF: lactated ringers at 50 mL/hr  Goal of Therapy:  Electrolytes WNL  Plan:   electrolytes WNL at this time and not req'ing repletion. CTM and replace PRN with AM labs.  Vallery Sa, PharmD, BCPS Clinical Pharmacist 07/08/2021 6:58 AM

## 2021-07-08 NOTE — Progress Notes (Signed)
NAME:  Kim Hicks, MRN:  [id number], DOB:  December 31, 1965, LOS: 2 ADMISSION DATE:  07/13/2021 56 yo F presenting to [facility] ED from home with her husband via EMS as CPR in progress. (History and events gathered from ED documentation & husband, [name], at bedside) Patient was in her normal state of health until waking up on the morning of 06/28/2021 with significant abdominal distension & LUQ discomfort. She began having dyspnea and her husband stepped out of the room to get a fan at which point he heard her collapse on the living room floor. EMS was called finding the patient down for about 10 minutes when they arrived and initial rhythm was asystole, CPR initiated around 14:45 per EMS report. King airway placed in field. In transport ROSC was achieved briefly before patient again pulseless in asystole.  Patient arrived CPR in progress around 15:30. Transport was estimated at about 45 minutes. ED course: ROSC documented at 15:40 shortly after arrival. Patient emergently intubated requiring mechanical ventilatory support. Initial work up revealed DKA with acidosis & CBG > 700, septic protocol initiated with initial lactic >9. Patient noted to have severe abdominal distention firm to touch. CT imaging showing toxic megacolon. Dr. Lysle Pearl from general surgery assessed the patient bedside and attempted manual decompression with minor effect; however patient too unstable for surgery.  Medications given: Unasyn, 2 L NS bolus, insulin drip & levophed drip started with continuous IVF Initial Vitals: afebrile 96, tachypneic 35, NSR 82, BP 98/66 & Spo2 88% on king airway Significant labs: (Labs/ Imaging personally reviewed) I, Domingo Pulse [place], AGACNP-BC, personally viewed and interpreted this ECG. EKG Interpretation: Date: 07/09/2021, EKG Time: 15:52, Rate: 96, Rhythm: 1st degree HB-however abnormal P wave-not consistent/ectopic?, QRS Axis:  LAD Intervals: prolonged PR interval, mildly prolonged QTc, ST/T Wave  abnormalities: non specific T wave inversions, mild STE in III only, Narrative Interpretation: 1st degree HB with abnormal P waves- ectopic? And mildly prolonged QTc Chemistry: Na+:130, K+: 4.8, Cl: 95, BUN/Cr.: 29/1.39, Serum CO2/ AG: 17/18, Mg: 4.9, Lipase: 96, alk phos: 312, AST: 78, ammonia: 96 Hematology: WBC: 12, Hgb: 12.3,  Troponin: 39 > 27, Lactic/ PCT: >9-7.8/ 0.17, COVID-19 & Influenza A/B: negative VBG: 7.17/ 53/ 50/ 19.3 >> 7.20/ 53/ 56/ 20.7 CXR 06/27/2021: no acute cardiopulmonary process. Atelectasis in mid & lower lungs with low lung volumes CT head wo contrast 06/28/2021: no intracranial abnormalities CTa chest 07/12/2021: negative for PE. RLL consolidation, query multiple acute minimally displaced rib fractures, multiple subacute/chronic bilateral rib fractures. Acute minimally displaced sternal fracture.  CT abdomen/pelvis w contrast 07/20/2021: Marked gas & fluid dilatation of the large bowel measuring up to 10 cm. No transition point identified. Ogilvie syndrome cannot be fully excluded. Cirrhosis with portal hypertensionand marked hepatic parenchyma heterogeneity suggestive of underlying mass lesion. Cholelithiasis. MRI BRAIN 2/13 HIE Of note in chart review patient had recent elective ERCP for biliary stent removal/exchange for previous biliary leak without complication.   PCCM consulted for admission in the setting of out of hospital cardiac arrest requiring mechanical ventilatory support and toxic megacolon.   Pertinent  Medical History  NASH cirrhosis Type 2 Diabetes Mellitus Pancreatitis (06/2020) Iron deficiency anemia Cholecystectomy Chronic constipation HTN OSA on oxygen overnight (unclear how much/what kind per husband) HLD LEFT foot fracture Anxiety & depression IBS   Significant Hospital Events: Including procedures, antibiotic start and stop dates in addition to other pertinent events   07/13/2021: Admit to ICU with toxic megacolon s/p out of hospital cardiac arrest  in  DKA on mechanical ventilatory support and pressors 2/13 remains on vent, shock, on insulin, MRI abnormal for HIE   Antimicrobials:   Antibiotics Given (last 72 hours)     Date/Time Action Medication Dose Rate   07/11/2021 1802 New Bag/Given   Ampicillin-Sulbactam (UNASYN) 3 g in sodium chloride 0.9 % 100 mL IVPB 3 g 200 mL/hr   07/07/21 0041 New Bag/Given   Ampicillin-Sulbactam (UNASYN) 3 g in sodium chloride 0.9 % 100 mL IVPB 3 g 200 mL/hr   07/07/21 0545 New Bag/Given   Ampicillin-Sulbactam (UNASYN) 3 g in sodium chloride 0.9 % 100 mL IVPB 3 g 200 mL/hr   07/07/21 1252 New Bag/Given   piperacillin-tazobactam (ZOSYN) IVPB 3.375 g 3.375 g 12.5 mL/hr   07/07/21 2305 New Bag/Given   piperacillin-tazobactam (ZOSYN) IVPB 3.375 g 3.375 g 12.5 mL/hr   07/08/21 0524 New Bag/Given   piperacillin-tazobactam (ZOSYN) IVPB 3.375 g 3.375 g 12.5 mL/hr            Interim History / Subjective:  Remains intubated Remains critically ill On vent MRI c/w HIE, clinical signs of brain damage  Vent Mode: PRVC FiO2 (%):  [35 %-50 %] 35 % Set Rate:  [22 bmp] 22 bmp Vt Set:  [450 mL] 450 mL PEEP:  [5 cmH20-8 cmH20] 5 cmH20 Plateau Pressure:  [17 cmH20] 17 cmH20        Objective   Blood pressure (!) 165/74, pulse 75, temperature 98.4 F (36.9 C), resp. rate 20, height _0  (1.651 m), weight 102.3 kg, SpO2 100 %. CVP:  [7 mmHg-17 mmHg] 12 mmHg  Vent Mode: PRVC FiO2 (%):  [35 %-50 %] 35 % Set Rate:  [22 bmp] 22 bmp Vt Set:  [450 mL] 450 mL PEEP:  [5 cmH20-8 cmH20] 5 cmH20 Plateau Pressure:  [17 cmH20] 17 cmH20   Intake/Output Summary (Last 24 hours) at 07/08/2021 0705 Last data filed at 07/08/2021 0505 Gross per 24 hour  Intake 2867.05 ml  Output 1635 ml  Net 1232.05 ml    Filed Weights   07/03/2021 1546 07/07/21 0452 07/08/21 0412  Weight: 105.2 kg 96.9 kg 102.3 kg      REVIEW OF SYSTEMS  PATIENT IS UNABLE TO PROVIDE COMPLETE REVIEW OF SYSTEMS DUE TO SEVERE CRITICAL  ILLNESS AND TOXIC METABOLIC ENCEPHALOPATHY    PHYSICAL EXAMINATION:  GENERAL:critically ill appearing, +resp distress EYES: Pupils equal, round, reactive to light.  No scleral icterus.  MOUTH: Moist mucosal membrane. INTUBATED NECK: Supple.  PULMONARY: +rhonchi, +wheezing CARDIOVASCULAR: S1 and S2.  No murmurs  GASTROINTESTINAL: Soft, nontender, -distended. Positive bowel sounds.  MUSCULOSKELETAL: No swelling, clubbing, or edema.  NEUROLOGIC: obtunded SKIN:intact,warm,dry     Labs/imaging that I havepersonally reviewed  (right click and "Reselect all SmartList Selections" daily)      ASSESSMENT AND PLAN SYNOPSIS  56 yo white female with acute and severe resp failure with acute sudden cardiac arrest due leading to acute and severe hypoxic pres failure due to demand ischemia from severe metabolic acidosis with DKA with underlying toxic megacolon and aspiration pneumonia with severe anoxic brain injury/damage  Severe ACUTE Hypoxic and Hypercapnic Respiratory Failure -continue Mechanical Ventilator support -Wean Fio2 and PEEP as tolerated -VAP/VENT bundle implementation - Wean PEEP & FiO2 as tolerated, maintain SpO2 > 88% - Head of bed elevated 30 degrees, VAP protocol in place - Plateau pressures less than 30 cm H20  - Intermittent chest x-ray & ABG PRN - Ensure adequate pulmonary hygiene  -will NOT perform SAT/SBT when respiratory parameters  are met   Vent Mode: PRVC FiO2 (%):  [35 %-50 %] 35 % Set Rate:  [22 bmp] 22 bmp Vt Set:  [450 mL] 450 mL PEEP:  [5 cmH20-8 cmH20] 5 cmH20 Plateau Pressure:  [17 cmH20] 17 cmH20   CARDIAC FAILURE-demand ischemia S/p cardiac arrest TTM normothermia protocol -oxygen as needed -Lasix as tolerated -follow up cardiac enzymes as indicated   CARDIAC ICU monitoring  ACUTE KIDNEY INJURY/Renal Failure -continue Foley Catheter-assess need -Avoid nephrotoxic agents -Follow urine output, BMP -Ensure adequate renal perfusion,  optimize oxygenation -Renal dose medications   Intake/Output Summary (Last 24 hours) at 07/08/2021 0708 Last data filed at 07/08/2021 0505 Gross per 24 hour  Intake 2867.05 ml  Output 1635 ml  Net 1232.05 ml      NEUROLOGY Acute toxic metabolic encephalopathy Wean sedation and assess neuro status MRI brain severe brain damage   INFECTIOUS DISEASE -continue antibiotics as prescribed -follow up cultures   ENDO - ICU hypoglycemic\Hyperglycemia protocol -check FSBS per protocol   GI GI PROPHYLAXIS as indicated  NUTRITIONAL STATUS DIET-->TF's as tolerated Constipation protocol as indicated   ELECTROLYTES -follow labs as needed -replace as needed -pharmacy consultation and following   Best practice (right click and "Reselect all SmartList Selections" daily)  Diet:  NPO Pain/Anxiety/Delirium protocol (if indicated): Yes (RASS goal 0) VAP protocol (if indicated): Yes DVT prophylaxis: Subcutaneous Heparin GI prophylaxis: PPI Glucose control:  Insulin gtt Central venous access:  Yes, and it is still needed Arterial line:  Yes, and it is still needed Foley:  Yes, and it is still needed Mobility:  bed rest  Code Status:  FULL CODE Disposition: ICU  Labs   CBC: Recent Labs  Lab 07/01/2021 1543 07/07/21 0448 07/08/21 0309  WBC 12.0* 10.2 9.4  NEUTROABS 3.8  --   --   HGB 12.3 11.4* 10.3*  HCT 44.6 37.0 33.7*  MCV 99.8 90.0 91.6  PLT 203 127* 119*     Basic Metabolic Panel: Recent Labs  Lab 07/04/2021 1606 07/02/2021 1637 06/29/2021 2145 07/07/21 0016 07/07/21 0448 07/07/21 0730 07/08/21 0309  NA  --    < > 132* 137 138 138 140  K  --    < > 4.2 3.5 4.1 3.8 3.7  CL  --    < > 102 107 107 109 110  CO2  --    < > 21* _0 GLUCOSE  --    < > 412* 225* 142* 214* 197*  BUN  --    < > 30* 27* 24* 23* 24*  CREATININE  --    < > 1.07* 0.88 0.65 0.59 0.64  CALCIUM  --    < > 8.1* 7.6* 7.6* 7.4* 7.8*  MG 4.9*  --   --   --  4.3*  --  3.1*  PHOS  --    --  4.0  --  2.7  --  3.5   < > = values in this interval not displayed.    GFR: Estimated Creatinine Clearance: 93.1 mL/min (by C-G formula based on SCr of 0.64 mg/dL). Recent Labs  Lab 07/18/2021 1543 07/11/2021 1606 06/30/2021 1811 07/07/21 0008 07/07/21 0448 07/08/21 0309  PROCALCITON  --  0.17  --   --  15.52 8.60  WBC 12.0*  --   --   --  10.2 9.4  LATICACIDVEN >9.0*  --  7.8* 3.5* 2.3*  --      Liver Function Tests: Recent Labs  Lab  07/08/2021 1543 07/07/21 0448 07/08/21 0309  AST 78* 714* 1,215*  ALT 44 489* 885*  ALKPHOS 312* 209* 164*  BILITOT 0.7 0.3 0.5  PROT 7.4 5.7* 5.9*  ALBUMIN 3.7 3.1* 3.0*    Recent Labs  Lab 07/13/2021 1606 07/07/21 0448  LIPASE 96* 34  AMYLASE  --  63    Recent Labs  Lab 06/27/2021 1606 07/07/21 0448  AMMONIA 96* 47*     ABG    Component Value Date/Time   PHART 7.37 07/07/2021 0000   PCO2ART 43 07/07/2021 0000   PO2ART 200 (H) 07/07/2021 0000   HCO3 24.9 07/07/2021 0000   ACIDBASEDEF 0.5 07/07/2021 0000   O2SAT 99.7 07/07/2021 0000      Coagulation Profile: Recent Labs  Lab 07/16/2021 1543  INR 1.2     Cardiac Enzymes: No results for input(s): CKTOTAL, CKMB, CKMBINDEX, TROPONINI in the last 168 hours.  HbA1C: Hgb A1c MFr Bld  Date/Time Value Ref Range Status  07/07/2021 05:00 AM 9.9 (H) 4.8 - 5.6 % Final    Comment:    (NOTE) Pre diabetes:          5.7%-6.4%  Diabetes:              >6.4%  Glycemic control for   <7.0% adults with diabetes   09/30/2020 04:22 PM 9.5 (H) 4.8 - 5.6 % Final    Comment:    (NOTE) Pre diabetes:          5.7%-6.4%  Diabetes:              >6.4%  Glycemic control for   <7.0% adults with diabetes     CBG: Recent Labs  Lab 07/07/21 1331 07/07/21 1509 07/07/21 1924 07/07/21 2327 07/08/21 0322  GLUCAP 134* 136* 165* 187* 153*     Allergies Allergies  Allergen Reactions   Dilaudid [Hydromorphone] Hives   Talwin [Pentazocine]     Turned blue around lips and face,  rash all over body       DVT/GI PRX  assessed I Assessed the need for Labs I Assessed the need for Foley I Assessed the need for Central Venous Line Family Discussion when available I Assessed the need for Mobilization I made an Assessment of medications to be adjusted accordingly Safety Risk assessment completed  CASE DISCUSSED IN MULTIDISCIPLINARY ROUNDS WITH ICU TEAM   RECOMMEND DNR STATUS RECOMMEND ONE WAY EXTUBATION AND COMFORT CARE  Critical Care Time devoted to patient care services described in this note is 50 minutes.  Critical care was necessary to treat /prevent imminent and life-threatening deterioration. Overall, patient is critically ill, prognosis is guarded.  Patient with Multiorgan failure and at high risk for cardiac arrest and death.    Corrin Parker, M.D.  [facility]  Medical Director [place] Director [facility] Cardio-Pulmonary Department      DVT/GI PRX  assessed I Assessed the need for Labs I Assessed the need for Foley I Assessed the need for Central Venous Line Family Discussion when available I Assessed the need for Mobilization I made an Assessment of medications to be adjusted accordingly Safety Risk assessment completed  CASE DISCUSSED IN MULTIDISCIPLINARY ROUNDS WITH ICU TEAM     Critical Care Time devoted to patient care services described in this note is 55 minutes.  Critical care was necessary to treat or prevent imminent or life-threatening deterioration.   PATIENT WITH VERY POOR PROGNOSIS I ANTICIPATE PROLONGED ICU LOS  Patient with Multiorgan  failure and at high risk for cardiac arrest and death.    Corrin Parker, M.D.  [facility]  Medical Director [place] Director [facility] Cardio-Pulmonary Department

## 2021-07-08 NOTE — Plan of Care (Signed)
Attempted to see patient however nursing staff was working with patient. Obtained HPI from husband and will attempt to see later.   Electronically signed by:  Lynnae Sandhoff, MD Page: [phone] 07/08/2021, 8:51 AM

## 2021-07-08 NOTE — Progress Notes (Signed)
Daily Progress Note   Patient Name: Kim Hicks       Date: 07/08/2021 DOB: [birth year]  Age: 56 y.o. MRN#: [id number] Attending Physician: Flora Lipps, MD Primary Care Physician: [name], FNP Admit Date: 07/16/2021  Reason for Consultation/Follow-up: Establishing goals of care  Subjective: Patient is resting in bed on ventilator. Spoke with husband and sister in law who was part of initiation of CPR. Upon discussing CPR, she states "it didn't work". She is aware of poor prognosis. She discusses that patient's son recently tried to kill himself and was admitted to behavioral health. She states when family mentioned him coming, he said he could not because "of how he acted in the hospital." CCM staff states they are unaware of a reason he could not come at this time.  He discusses her suffering since her surgery a year ago, and the progress she made, but suffering she endured.   We discussed her diagnoses, prognosis, GOC, EOL wishes disposition and options.  A detailed discussion was had today regarding advanced directives.  Concepts specific to code status, artifical feeding and hydration were discussed.  The difference between an aggressive medical intervention path and a comfort care path was discussed. Values and goals of care important to patient and family were attempted to be elicited.  Discussed limitations of medical interventions to prolong quality of life in some situations and discussed the concept of human mortality.   Husband states patient would not be accepting of her current status. He states he is considering letting her go; he states he needs to think on this.     Length of Stay: 2  Current Medications: Scheduled Meds:   chlorhexidine gluconate (MEDLINE KIT)  15 mL  Mouth Rinse BID   Chlorhexidine Gluconate Cloth  6 each Topical Daily   feeding supplement (VITAL HIGH PROTEIN)  1,000 mL Per Tube Q24H   free water  30 mL Per Tube Q4H   insulin aspart  2-6 Units Subcutaneous Q4H   insulin detemir  12 Units Subcutaneous Q12H   mouth rinse  15 mL Mouth Rinse 10 times per day   multivitamin  15 mL Per Tube Daily    Continuous Infusions:  sodium chloride Stopped (07/08/2021 1641)   famotidine (PEPCID) IV Stopped (07/08/21 1037)   lactated ringers 50 mL/hr at 07/08/21  1330   levETIRAcetam Stopped (07/08/21 1300)   midazolam 2 mg/hr (07/08/21 1200)   piperacillin-tazobactam (ZOSYN)  IV Stopped (07/08/21 0924)    PRN Meds: albuterol, dextrose, fentaNYL (SUBLIMAZE) injection, hydrALAZINE, labetalol, midazolam  Physical Exam Constitutional:      Comments: Eyes closed. On vent.             Vital Signs: BP (!) 165/81    Pulse 99    Temp 98.6 F (37 C) (Esophageal)    Resp (!) 26    Ht 5' 5"  (1.651 m)    Wt 102.3 kg    SpO2 100%    BMI 37.53 kg/m  SpO2: SpO2: 100 % O2 Device: O2 Device: Ventilator O2 Flow Rate:    Intake/output summary:  Intake/Output Summary (Last 24 hours) at 07/08/2021 1421 Last data filed at 07/08/2021 1200 Gross per 24 hour  Intake 2778.1 ml  Output 1270 ml  Net 1508.1 ml   LBM: Last BM Date : 07/08/21 Baseline Weight: Weight: 105.2 kg Most recent weight: Weight: 102.3 kg        Patient Active Problem List   Diagnosis Date Noted   AKI (acute kidney injury) ([place]) 07/07/2021   Endotracheally intubated 07/07/2021   On mechanically assisted ventilation ([place]) 07/07/2021   Acute encephalopathy 07/07/2021   Transaminitis 07/07/2021   Diabetic ketoacidosis with coma associated with type 2 diabetes mellitus ([place])    Toxic megacolon ([place]) 07/22/2021   Cardiac arrest (HCC)    RLS (restless legs syndrome) 02/17/2021   Anemia 12/03/2020   Benign neoplasm of rectum    AVM (arteriovenous malformation) of colon, acquired     Portal hypertensive gastropathy ([place])    On home oxygen therapy 11/05/2020   Biliary drain displacement 11/01/2020   Iron deficiency anemia 10/03/2020   Common migraine with intractable migraine 19/50/9326   Metabolic encephalopathy    Septic shock ([place]) 07/06/2020   Acute respiratory failure with hypoxemia ([place]) 07/06/2020   Pressure injury of skin 07/04/2020   Post-ERCP acute pancreatitis 06/27/2020   Pancreatitis 06/27/2020   Abscess of gallbladder    Migration of biliary stent    Malnutrition of moderate degree 05/23/2020   Abdominal pain 05/21/2020   Biloma 05/20/2020   Biliary sludge    Complicated UTI (urinary tract infection) 03/11/2020   Encounter for removal of biliary stent    Diabetic peripheral neuropathy ([place]) 09/14/2019   Gait disturbance 09/14/2019   NASH (nonalcoholic steatohepatitis) 08/21/2019   Bile leak    Other cirrhosis of liver ([place]) 08/04/2019   Major depressive disorder, recurrent episode, severe, with psychotic behavior ([place]) 03/25/2011   DYSPHAGIA UNSPECIFIED 03/04/2009   GERD 02/18/2009   PERSISTENT VOMITING 02/18/2009   CHEST PAIN 02/18/2009   NAUSEA AND VOMITING 02/18/2009   Dehydration 07/19/2007   GASTROENTERITIS 07/19/2007   FIBROIDS, UTERUS 05/02/2007   Depression 05/02/2007   MIGRAINE HEADACHE 05/02/2007   Hypertension 05/02/2007   HEMORRHOIDS 05/02/2007   Chronic constipation 05/02/2007   IBS 05/02/2007   PSORIASIS 05/02/2007   PITUITARY NEOPLASM, HX OF 05/02/2007   LIVER FUNCTION TESTS, ABNORMAL, HX OF 05/02/2007   Diabetes mellitus without complication ([place]) 71/24/5809   Hyperlipidemia associated with type 2 diabetes mellitus ([place]) 02/04/2007   SINUSITIS, ACUTE 02/04/2007   Abdominal pain, right upper quadrant 02/04/2007   Hyperlipidemia 02/04/2007    Palliative Care Assessment & Plan    Recommendations/Plan:  Continue full code/ full scope at this time. Husband understands her poor prognosis and is considering transitioning  to comfort care.  Code Status:    Code Status Orders  (From admission, onward)           Start     Ordered   07/05/2021 2017  Full code  Continuous        06/26/2021 2018           Code Status History     Date Active Date Inactive Code Status Order ID Comments User Context   11/01/2020 2157 11/02/2020 0357 Full Code [id number]  Rhetta Mura, DO ED   09/30/2020 1539 10/03/2020 2006 Full Code [id number]  Collier Bullock, MD Inpatient   06/27/2020 2057 07/13/2020 1701 Full Code [id number]  Toy Baker, MD ED   05/20/2020 2229 05/30/2020 1935 Full Code [id number]  Lenore Cordia, MD ED   08/04/2019 1139 08/11/2019 1619 Full Code [id number]  [name], MD Inpatient    Care plan was discussed with RN  Thank you for allowing the Palliative Medicine Team to assist in the care of this patient.       Total Time 45 min Prolonged Time Billed  no       Greater than 50%  of this time was spent counseling and coordinating care related to the above assessment and plan.  Asencion Gowda, NP  Please contact Palliative Medicine Team phone at [phone] for questions and concerns.

## 2021-07-08 NOTE — Progress Notes (Signed)
Nutrition Follow Up Note   DOCUMENTATION CODES:   Obesity unspecified  INTERVENTION:   If family wishes to pursue full aggressive care, may need to consider TPN initiation in the next 24 hrs.   Pt at high refeed risk; recommend monitor potassium, magnesium and phosphorus labs daily until stable  NUTRITION DIAGNOSIS:   Inadequate oral intake related to inability to eat (pt sedated and ventilated) as evidenced by NPO status.  GOAL:   Provide needs based on ASPEN/SCCM guidelines -not met   MONITOR:   Vent status, Labs, Weight trends, Skin, I & O's  ASSESSMENT:   56 y/o female with h/o NASH cirrhosis, DM, HLD, MDD, HTN, GERD, IBS, pancreatitis, OSA and recent left foot fracture who is admitted with cardiac arrest secondary to toxic megacolon.  Pt sedated and ventilated. OGT in place. Pt on trickle feeds overnight but has worsening abdominal distension so tube feeds held this morning. Pt with absent bowel sounds. There is very little output from rectal tube. Pt with possible ileus. Neurology following; pt with severe hypoxic-ischemic encephalopathy. Palliative care following for GOC; family requesting full care for now. If pt is unable to re-start tube feeds, recommend consideration of TPN in the next 24 hrs.    Medications reviewed and include: insulin, MVI, pepcid, LRS _0 /hr, pepcid, zosyn   Labs reviewed: K 3.7 wnl, BUN 24(H), P 3.5 wnl, Mg 3.1(H), AST 1215(H), ALT 885(H) Hgb 10.3(L), Hct 33.7(L) Cbgs- 196, 164, 153 x 24 hrs  Patient is currently intubated on ventilator support MV: 10.6 L/min Temp (24hrs), Avg:99.1 F (37.3 C), Min:97.7 F (36.5 C), Max:100.2 F (37.9 C)  Propofol: none   MAP- >78mHg   UOP- 1283m  Diet Order:   Diet Order             Diet NPO time specified  Diet effective now                  EDUCATION NEEDS:   No education needs have been identified at this time  Skin:  Skin Assessment: Reviewed RN Assessment  Last BM:  2/14-  10020mia rectal tube  Height:   Ht Readings from Last 1 Encounters:  07/07/21 5' 5" (1.651 m)    Weight:   Wt Readings from Last 1 Encounters:  07/08/21 102.3 kg    Ideal Body Weight:  56.8 kg  BMI:  Body mass index is 37.53 kg/m.  Estimated Nutritional Needs:   Kcal:  1065-1356kcal/day  Protein:  >115g/day  Fluid:  1.7-2.0L/day  CasKoleen Distance, RD, LDN Please refer to [facility]r RD and/or RD on-call/weekend/after hours pager

## 2021-07-08 NOTE — Plan of Care (Signed)
Non-responsive to painful stimuli. GCS = 3. PERRLA. Unable to elicit stem reflexes. BP elevated this evening; cardene infusion initiated per orders. Remains on [place] with varying water temp.   Problem: Education: Goal: Knowledge of General Education information will improve Description: Including pain rating scale, medication(s)/side effects and non-pharmacologic comfort measures Outcome: Not Progressing   Problem: Health Behavior/Discharge Planning: Goal: Ability to manage health-related needs will improve Outcome: Not Progressing   Problem: Clinical Measurements: Goal: Ability to maintain clinical measurements within normal limits will improve Outcome: Not Progressing Goal: Will remain free from infection Outcome: Not Progressing Goal: Diagnostic test results will improve Outcome: Not Progressing Goal: Respiratory complications will improve Outcome: Not Progressing Goal: Cardiovascular complication will be avoided Description: Care plan initiated Outcome: Not Progressing   Problem: Activity: Goal: Risk for activity intolerance will decrease Outcome: Not Progressing   Problem: Nutrition: Goal: Adequate nutrition will be maintained Outcome: Not Progressing   Problem: Coping: Goal: Level of anxiety will decrease Outcome: Not Progressing   Problem: Elimination: Goal: Will not experience complications related to bowel motility Outcome: Not Progressing Goal: Will not experience complications related to urinary retention Outcome: Not Progressing   Problem: Pain Managment: Goal: General experience of comfort will improve Outcome: Not Progressing   Problem: Safety: Goal: Ability to remain free from injury will improve Outcome: Not Progressing   Problem: Skin Integrity: Goal: Risk for impaired skin integrity will decrease Outcome: Not Progressing

## 2021-07-09 DIAGNOSIS — K5931 Toxic megacolon: Secondary | ICD-10-CM | POA: Diagnosis not present

## 2021-07-09 DIAGNOSIS — Z7189 Other specified counseling: Secondary | ICD-10-CM | POA: Diagnosis not present

## 2021-07-09 LAB — COMPREHENSIVE METABOLIC PANEL
ALT: 1082 U/L — ABNORMAL HIGH (ref 0–44)
AST: 1344 U/L — ABNORMAL HIGH (ref 15–41)
Albumin: 2.9 g/dL — ABNORMAL LOW (ref 3.5–5.0)
Alkaline Phosphatase: 185 U/L — ABNORMAL HIGH (ref 38–126)
Anion gap: 8 (ref 5–15)
BUN: 26 mg/dL — ABNORMAL HIGH (ref 6–20)
CO2: 21 mmol/L — ABNORMAL LOW (ref 22–32)
Calcium: 7.8 mg/dL — ABNORMAL LOW (ref 8.9–10.3)
Chloride: 113 mmol/L — ABNORMAL HIGH (ref 98–111)
Creatinine, Ser: 0.54 mg/dL (ref 0.44–1.00)
GFR, Estimated: >60 mL/min (ref >60)
Glucose, Bld: 97 mg/dL (ref 70–99)
Potassium: 3.5 mmol/L (ref 3.5–5.1)
Sodium: 142 mmol/L (ref 135–145)
Total Bilirubin: 0.9 mg/dL (ref 0.3–1.2)
Total Protein: 6.4 g/dL — ABNORMAL LOW (ref 6.5–8.1)

## 2021-07-09 LAB — GLUCOSE, CAPILLARY
Glucose-Capillary: 101 mg/dL — ABNORMAL HIGH (ref 70–99)
Glucose-Capillary: 102 mg/dL — ABNORMAL HIGH (ref 70–99)
Glucose-Capillary: 93 mg/dL (ref 70–99)
Glucose-Capillary: 99 mg/dL (ref 70–99)
Glucose-Capillary: 88 mg/dL (ref 70–99)
Glucose-Capillary: 84 mg/dL (ref 70–99)
Glucose-Capillary: 92 mg/dL (ref 70–99)

## 2021-07-09 LAB — CBC
HCT: 36.2 % (ref 36.0–46.0)
Hemoglobin: 11 g/dL — ABNORMAL LOW (ref 12.0–15.0)
MCH: 27.2 pg (ref 26.0–34.0)
MCHC: 30.4 g/dL (ref 30.0–36.0)
MCV: 89.6 fL (ref 80.0–100.0)
Platelets: 138 10*3/uL — ABNORMAL LOW (ref 150–400)
RBC: 4.04 MIL/uL (ref 3.87–5.11)
RDW: 14.9 % (ref 11.5–15.5)
WBC: 11.9 10*3/uL — ABNORMAL HIGH (ref 4.0–10.5)
nRBC: 0 % (ref 0.0–0.2)

## 2021-07-09 NOTE — TOC Initial Note (Signed)
Transition of Care [facility]) - Initial/Assessment Note    Patient Details  Name: Kim Hicks MRN: [id number] Date of Birth: 1965-07-17  Transition of Care [facility]) CM/SW Contact:    Shelbie Hutching, RN Phone Number: 07/09/2021, 9:59 AM  Clinical Narrative:                 Patient came into the hospital after cardiac arrest at home.  Intubated in the emergency room.  Patient is from home with her husband.  Poor prognosis, anoxic brain injury.  Palliative is following.  Expected Discharge Plan:  (TBD) Barriers to Discharge: Continued Medical Work up   Patient Goals and CMS Choice Patient states their goals for this hospitalization and ongoing recovery are:: Family has not made a decision at this time      Expected Discharge Plan and Services Expected Discharge Plan:  (TBD)       Living arrangements for the past 2 months: Single Family Home                 DME Arranged: N/A DME Agency: NA       HH Arranged: NA [place] Agency: NA        Prior Living Arrangements/Services Living arrangements for the past 2 months: Single Family Home Lives with:: Spouse Patient language and need for interpreter reviewed:: Yes        Need for Family Participation in Patient Care: Yes (Comment) (Cardiac arrest,anoxic brain injury) Care giver support system in place?: Yes (comment) (husband)   Criminal Activity/Legal Involvement Pertinent to Current Situation/Hospitalization: No - Comment as needed  Activities of Daily Living      Permission Sought/Granted   Permission granted to share information with : No              Emotional Assessment Appearance:: Appears stated age Attitude/Demeanor/Rapport: Intubated (Following Commands or Not Following Commands) Affect (typically observed): Unable to Assess   Alcohol / Substance Use: Not Applicable Psych Involvement: No (comment)  Admission diagnosis:  Cardiac arrest ([place]) [I46.9] Toxic megacolon ([place]) [K59.31] Diabetic ketoacidosis with  coma associated with type 2 diabetes mellitus ([place]) [E11.11] Patient Active Problem List   Diagnosis Date Noted   AKI (acute kidney injury) ([place]) 07/07/2021   Endotracheally intubated 07/07/2021   On mechanically assisted ventilation ([place]) 07/07/2021   Acute encephalopathy 07/07/2021   Transaminitis 07/07/2021   Diabetic ketoacidosis with coma associated with type 2 diabetes mellitus ([place])    Toxic megacolon ([place]) 07/03/2021   Cardiac arrest ([place])    RLS (restless legs syndrome) 02/17/2021   Anemia 12/03/2020   Benign neoplasm of rectum    AVM (arteriovenous malformation) of colon, acquired    Portal hypertensive gastropathy ([place])    On home oxygen therapy 11/05/2020   Biliary drain displacement 11/01/2020   Iron deficiency anemia 10/03/2020   Common migraine with intractable migraine 04/88/8916   Metabolic encephalopathy    Septic shock ([place]) 07/06/2020   Acute respiratory failure with hypoxemia ([place]) 07/06/2020   Pressure injury of skin 07/04/2020   Post-ERCP acute pancreatitis 06/27/2020   Pancreatitis 06/27/2020   Abscess of gallbladder    Migration of biliary stent    Malnutrition of moderate degree 05/23/2020   Abdominal pain 05/21/2020   Biloma 05/20/2020   Biliary sludge    Complicated UTI (urinary tract infection) 03/11/2020   Encounter for removal of biliary stent    Diabetic peripheral neuropathy ([place]) 09/14/2019   Gait disturbance 09/14/2019   NASH (nonalcoholic steatohepatitis) 08/21/2019  Bile leak    Other cirrhosis of liver (H[facility]) 08/04/2019   Major depressive disorder, recurrent episode, severe, with psychotic behavior ([place]) 03/25/2011   DYSPHAGIA UNSPECIFIED 03/04/2009   GERD 02/18/2009   PERSISTENT VOMITING 02/18/2009   CHEST PAIN 02/18/2009   NAUSEA AND VOMITING 02/18/2009   Dehydration 07/19/2007   GASTROENTERITIS 07/19/2007   FIBROIDS, UTERUS 05/02/2007   Depression 05/02/2007   MIGRAINE HEADACHE 05/02/2007   Hypertension 05/02/2007    HEMORRHOIDS 05/02/2007   Chronic constipation 05/02/2007   IBS 05/02/2007   PSORIASIS 05/02/2007   PITUITARY NEOPLASM, HX OF 05/02/2007   LIVER FUNCTION TESTS, ABNORMAL, HX OF 05/02/2007   Diabetes mellitus without complication ([place]) 74/14/2395   Hyperlipidemia associated with type 2 diabetes mellitus ([place]) 02/04/2007   SINUSITIS, ACUTE 02/04/2007   Abdominal pain, right upper quadrant 02/04/2007   Hyperlipidemia 02/04/2007   PCP:  [name], FNP Pharmacy:   [id number] [place] [id number]: [phone]: [phone]    Social Determinants of Health (SDOH) Interventions    Readmission Risk Interventions No flowsheet data found.

## 2021-07-09 NOTE — Progress Notes (Signed)
Daily Progress Note   Patient Name: Kim Hicks       Date: 07/09/2021 DOB: 1966-02-28  Age: 56 y.o. MRN#: [id number] Attending Physician: Flora Lipps, MD Primary Care Physician: [name], FNP Admit Date: 06/26/2021  Reason for Consultation/Follow-up: Establishing goals of care  Subjective: Patient is resting in bed on ventilator. Husband and her BIL are at bedside. He states he was able to speak with his son and other family members. He tells me she would not want to live like she is and states her QOL would not be acceptable to her.    Husband states he has made the decision to extubate and shift to full comfort care tomorrow morning. He states there are a few family members that will want to be present.   Length of Stay: 3  Current Medications: Scheduled Meds:   chlorhexidine gluconate (MEDLINE KIT)  15 mL Mouth Rinse BID   Chlorhexidine Gluconate Cloth  6 each Topical Daily   free water  30 mL Per Tube Q4H   insulin aspart  2-6 Units Subcutaneous Q4H   insulin detemir  12 Units Subcutaneous Q12H   mouth rinse  15 mL Mouth Rinse 10 times per day   multivitamin  15 mL Per Tube Daily    Continuous Infusions:  sodium chloride Stopped (07/08/21 2139)   famotidine (PEPCID) IV Stopped (07/09/21 1048)   lactated ringers 50 mL/hr at 07/09/21 1300   levETIRAcetam Stopped (07/09/21 1217)   midazolam 4 mg/hr (07/09/21 1300)   niCARDipine 10 mg/hr (07/09/21 1416)    PRN Meds: albuterol, dextrose, fentaNYL (SUBLIMAZE) injection, hydrALAZINE, labetalol, midazolam  Physical Exam Constitutional:      Comments: Eyes closed.   Pulmonary:     Comments: On ventilator.            Vital Signs: BP 129/72    Pulse 94    Temp 98.6 F (37 C)    Resp (!) 21    Ht _0  (1.651 m)    Wt  102.6 kg    SpO2 95%    BMI 37.64 kg/m  SpO2: SpO2: 95 % O2 Device: O2 Device: Ventilator O2 Flow Rate:    Intake/output summary:  Intake/Output Summary (Last 24 hours) at 07/09/2021 1450 Last data filed at 07/09/2021 1300 Gross per 24 hour  Intake  3688.77 ml  Output 530 ml  Net 3158.77 ml   LBM: Last BM Date : 07/08/21 Baseline Weight: Weight: 105.2 kg Most recent weight: Weight: 102.6 kg         Patient Active Problem List   Diagnosis Date Noted   AKI (acute kidney injury) ([place]) 07/07/2021   Endotracheally intubated 07/07/2021   On mechanically assisted ventilation ([place]) 07/07/2021   Acute encephalopathy 07/07/2021   Transaminitis 07/07/2021   Diabetic ketoacidosis with coma associated with type 2 diabetes mellitus ([place])    Toxic megacolon ([place]) 07/22/2021   Cardiac arrest ([place])    RLS (restless legs syndrome) 02/17/2021   Anemia 12/03/2020   Benign neoplasm of rectum    AVM (arteriovenous malformation) of colon, acquired    Portal hypertensive gastropathy ([place])    On home oxygen therapy 11/05/2020   Biliary drain displacement 11/01/2020   Iron deficiency anemia 10/03/2020   Common migraine with intractable migraine 94/17/4081   Metabolic encephalopathy    Septic shock ([place]) 07/06/2020   Acute respiratory failure with hypoxemia ([place]) 07/06/2020   Pressure injury of skin 07/04/2020   Post-ERCP acute pancreatitis 06/27/2020   Pancreatitis 06/27/2020   Abscess of gallbladder    Migration of biliary stent    Malnutrition of moderate degree 05/23/2020   Abdominal pain 05/21/2020   Biloma 05/20/2020   Biliary sludge    Complicated UTI (urinary tract infection) 03/11/2020   Encounter for removal of biliary stent    Diabetic peripheral neuropathy ([place]) 09/14/2019   Gait disturbance 09/14/2019   NASH (nonalcoholic steatohepatitis) 08/21/2019   Bile leak    Other cirrhosis of liver ([place]) 08/04/2019   Major depressive disorder, recurrent episode, severe, with psychotic  behavior ([place]) 03/25/2011   DYSPHAGIA UNSPECIFIED 03/04/2009   GERD 02/18/2009   PERSISTENT VOMITING 02/18/2009   CHEST PAIN 02/18/2009   NAUSEA AND VOMITING 02/18/2009   Dehydration 07/19/2007   GASTROENTERITIS 07/19/2007   FIBROIDS, UTERUS 05/02/2007   Depression 05/02/2007   MIGRAINE HEADACHE 05/02/2007   Hypertension 05/02/2007   HEMORRHOIDS 05/02/2007   Chronic constipation 05/02/2007   IBS 05/02/2007   PSORIASIS 05/02/2007   PITUITARY NEOPLASM, HX OF 05/02/2007   LIVER FUNCTION TESTS, ABNORMAL, HX OF 05/02/2007   Diabetes mellitus without complication ([place]) 44/81/8563   Hyperlipidemia associated with type 2 diabetes mellitus ([place]) 02/04/2007   SINUSITIS, ACUTE 02/04/2007   Abdominal pain, right upper quadrant 02/04/2007   Hyperlipidemia 02/04/2007    Palliative Care Assessment & Plan   Recommendations/Plan: Husband would like to extubate and shift to full comfort tomorrow morning once family members arrive.   Anticipate hospital death.       Code Status:    Code Status Orders  (From admission, onward)           Start     Ordered   07/08/21 1843  Do not attempt resuscitation (DNR)  Continuous       Question Answer Comment  In the event of cardiac or respiratory ARREST Do not call a code blue   In the event of cardiac or respiratory ARREST Do not perform Intubation, CPR, defibrillation or ACLS   In the event of cardiac or respiratory ARREST Use medication by any route, position, wound care, and other measures to relive pain and suffering. May use oxygen, suction and manual treatment of airway obstruction as needed for comfort.      07/08/21 1842           Code Status History  Date Active Date Inactive Code Status Order ID Comments User Context   07/19/2021 2018 07/08/2021 1842 Full Code [id number]  Rust-[place]ster, Huel Cote, NP ED   11/01/2020 2157 11/02/2020 0357 Full Code [id number]  Rhetta Mura, DO ED   09/30/2020 1539 10/03/2020 2006 Full Code  [id number]  Collier Bullock, MD Inpatient   06/27/2020 2057 07/13/2020 1701 Full Code [id number]  Toy Baker, MD ED   05/20/2020 2229 05/30/2020 1935 Full Code [id number]  Lenore Cordia, MD ED   08/04/2019 1139 08/11/2019 1619 Full Code [id number]  [name], MD Inpatient       Prognosis:  Very poor   Care plan was discussed with RN  Thank you for allowing the Palliative Medicine Team to assist in the care of this patient.       Greater than 50%  of this time was spent counseling and coordinating care related to the above assessment and plan.  Asencion Gowda, NP  Please contact Palliative Medicine Team phone at [phone] for questions and concerns.

## 2021-07-09 NOTE — Progress Notes (Signed)
NAME:  Kim Hicks, MRN:  [id number], DOB:  January 05, 1966, LOS: 3 ADMISSION DATE:  07/01/2021 56 yo F presenting to [facility] ED from home with her husband via EMS as CPR in progress. (History and events gathered from ED documentation & husband, [name], at bedside) Patient was in her normal state of health until waking up on the morning of 07/08/2021 with significant abdominal distension & LUQ discomfort. She began having dyspnea and her husband stepped out of the room to get a fan at which point he heard her collapse on the living room floor. EMS was called finding the patient down for about 10 minutes when they arrived and initial rhythm was asystole, CPR initiated around 14:45 per EMS report. King airway placed in field. In transport ROSC was achieved briefly before patient again pulseless in asystole.  Patient arrived CPR in progress around 15:30. Transport was estimated at about 45 minutes. ED course: ROSC documented at 15:40 shortly after arrival. Patient emergently intubated requiring mechanical ventilatory support. Initial work up revealed DKA with acidosis & CBG > 700, septic protocol initiated with initial lactic >9. Patient noted to have severe abdominal distention firm to touch. CT imaging showing toxic megacolon. Dr. Lysle Pearl from general surgery assessed the patient bedside and attempted manual decompression with minor effect; however patient too unstable for surgery.  Medications given: Unasyn, 2 L NS bolus, insulin drip & levophed drip started with continuous IVF Initial Vitals: afebrile 96, tachypneic 35, NSR 82, BP 98/66 & Spo2 88% on king airway Significant labs: (Labs/ Imaging personally reviewed) I, Domingo Pulse Rust-Chester, AGACNP-BC, personally viewed and interpreted this ECG. EKG Interpretation: Date: 07/02/2021, EKG Time: 15:52, Rate: 96, Rhythm: 1st degree HB-however abnormal P wave-not consistent/ectopic?, QRS Axis:  LAD Intervals: prolonged PR interval, mildly prolonged QTc, ST/T Wave  abnormalities: non specific T wave inversions, mild STE in III only, Narrative Interpretation: 1st degree HB with abnormal P waves- ectopic? And mildly prolonged QTc Chemistry: Na+:130, K+: 4.8, Cl: 95, BUN/Cr.: 29/1.39, Serum CO2/ AG: 17/18, Mg: 4.9, Lipase: 96, alk phos: 312, AST: 78, ammonia: 96 Hematology: WBC: 12, Hgb: 12.3,  Troponin: 39 > 27, Lactic/ PCT: >9-7.8/ 0.17, COVID-19 & Influenza A/B: negative VBG: 7.17/ 53/ 50/ 19.3 >> 7.20/ 53/ 56/ 20.7 CXR 07/20/2021: no acute cardiopulmonary process. Atelectasis in mid & lower lungs with low lung volumes CT head wo contrast 07/05/2021: no intracranial abnormalities CTa chest 07/19/2021: negative for PE. RLL consolidation, query multiple acute minimally displaced rib fractures, multiple subacute/chronic bilateral rib fractures. Acute minimally displaced sternal fracture.  CT abdomen/pelvis w contrast 07/04/2021: Marked gas & fluid dilatation of the large bowel measuring up to 10 cm. No transition point identified. Ogilvie syndrome cannot be fully excluded. Cirrhosis with portal hypertensionand marked hepatic parenchyma heterogeneity suggestive of underlying mass lesion. Cholelithiasis. MRI BRAIN 2/13 HIE Of note in chart review patient had recent elective ERCP for biliary stent removal/exchange for previous biliary leak without complication.   PCCM consulted for admission in the setting of out of hospital cardiac arrest requiring mechanical ventilatory support and toxic megacolon.   Pertinent  Medical History  NASH cirrhosis Type 2 Diabetes Mellitus Pancreatitis (06/2020) Iron deficiency anemia Cholecystectomy Chronic constipation HTN OSA on oxygen overnight (unclear how much/what kind per husband) HLD LEFT foot fracture Anxiety & depression IBS   Significant Hospital Events: Including procedures, antibiotic start and stop dates in addition to other pertinent events   06/26/2021: Admit to ICU with toxic megacolon s/p out of hospital cardiac arrest  in  DKA on mechanical ventilatory support and pressors 2/13 remains on vent, shock, on insulin, MRI abnormal for HIE 2/14 failed weaning trials severe brain damage   Antimicrobials:   Antibiotics Given (last 72 hours)     Date/Time Action Medication Dose Rate   07/14/2021 1802 New Bag/Given   Ampicillin-Sulbactam (UNASYN) 3 g in sodium chloride 0.9 % 100 mL IVPB 3 g 200 mL/hr   07/07/21 0041 New Bag/Given   Ampicillin-Sulbactam (UNASYN) 3 g in sodium chloride 0.9 % 100 mL IVPB 3 g 200 mL/hr   07/07/21 0545 New Bag/Given   Ampicillin-Sulbactam (UNASYN) 3 g in sodium chloride 0.9 % 100 mL IVPB 3 g 200 mL/hr   07/07/21 1252 New Bag/Given   piperacillin-tazobactam (ZOSYN) IVPB 3.375 g 3.375 g 12.5 mL/hr   07/07/21 2305 New Bag/Given   piperacillin-tazobactam (ZOSYN) IVPB 3.375 g 3.375 g 12.5 mL/hr   07/08/21 0524 New Bag/Given   piperacillin-tazobactam (ZOSYN) IVPB 3.375 g 3.375 g 12.5 mL/hr   07/08/21 1426 New Bag/Given   piperacillin-tazobactam (ZOSYN) IVPB 3.375 g 3.375 g 12.5 mL/hr   07/08/21 2139 New Bag/Given   piperacillin-tazobactam (ZOSYN) IVPB 3.375 g 3.375 g 12.5 mL/hr   07/09/21 0530 New Bag/Given   piperacillin-tazobactam (ZOSYN) IVPB 3.375 g 3.375 g 12.5 mL/hr            Interim History / Subjective:  MRI c/w HIE, clinical signs of brain damage DNR status Severe resp failure Very grave prognosis  Vent Mode: PRVC FiO2 (%):  [30 %-35 %] 30 % Set Rate:  [22 bmp] 22 bmp Vt Set:  [450 mL] 450 mL PEEP:  [5 cmH20] 5 cmH20 Pressure Support:  [10 cmH20] 10 cmH20 Plateau Pressure:  [12 cmH20-17 cmH20] 17 cmH20        Objective   Blood pressure (!) 155/71, pulse 95, temperature 98.4 F (36.9 C), resp. rate (!) 24, height _0  (1.651 m), weight 102.6 kg, SpO2 94 %. CVP:  [2 mmHg-23 mmHg] 12 mmHg  Vent Mode: PRVC FiO2 (%):  [30 %-35 %] 30 % Set Rate:  [22 bmp] 22 bmp Vt Set:  [450 mL] 450 mL PEEP:  [5 cmH20] 5 cmH20 Pressure Support:  [10 cmH20] 10  cmH20 Plateau Pressure:  [12 cmH20-17 cmH20] 17 cmH20   Intake/Output Summary (Last 24 hours) at 07/09/2021 0737 Last data filed at 07/09/2021 0700 Gross per 24 hour  Intake 3689.12 ml  Output 330 ml  Net 3359.12 ml    Filed Weights   07/07/21 0452 07/08/21 0412 07/09/21 0500  Weight: 96.9 kg 102.3 kg 102.6 kg    REVIEW OF SYSTEMS  PATIENT IS UNABLE TO PROVIDE COMPLETE REVIEW OF SYSTEMS DUE TO SEVERE CRITICAL ILLNESS AND TOXIC METABOLIC ENCEPHALOPATHY    PHYSICAL EXAMINATION:  GENERAL:critically ill appearing, +resp distress EYES: Pupils equal, round, reactive to light.  No scleral icterus.  MOUTH: Moist mucosal membrane. INTUBATED NECK: Supple.  PULMONARY: +rhonchi, +wheezing CARDIOVASCULAR: S1 and S2.  No murmurs  GASTROINTESTINAL: Soft, nontender, -distended. Positive bowel sounds.  MUSCULOSKELETAL: No swelling, clubbing, or edema.  NEUROLOGIC: obtunded SKIN:intact,warm,dry    Labs/imaging that I havepersonally reviewed  (right click and "Reselect all SmartList Selections" daily)      ASSESSMENT AND PLAN SYNOPSIS  56 yo white female with acute and severe resp failure with acute sudden cardiac arrest due leading to acute and severe hypoxic pres failure due to demand ischemia from severe metabolic acidosis with DKA with underlying toxic megacolon and aspiration pneumonia with severe anoxic brain injury/damage  Severe ACUTE Hypoxic and Hypercapnic Respiratory Failure -continue Mechanical Ventilator support -Wean Fio2 and PEEP as tolerated -VAP/VENT bundle implementation - Wean PEEP & FiO2 as tolerated, maintain SpO2 > 88% - Head of bed elevated 30 degrees, VAP protocol in place - Plateau pressures less than 30 cm H20  - Intermittent chest x-ray & ABG PRN - Ensure adequate pulmonary hygiene  -will NOT perform SAT/SBT when respiratory parameters are met  Vent Mode: PRVC FiO2 (%):  [30 %-35 %] 30 % Set Rate:  [22 bmp] 22 bmp Vt Set:  [450 mL] 450 mL PEEP:  [5  cmH20] 5 cmH20 Pressure Support:  [10 cmH20] 10 cmH20 Plateau Pressure:  [12 cmH20-17 cmH20] 17 cmH20   CARDIAC FAILURE-demand ischemia S/p cardiac arrest TTM normothermia protocol Leading to severe brain damage   CARDIAC ICU monitoring   ACUTE KIDNEY INJURY/Renal Failure -continue Foley Catheter-assess need -Avoid nephrotoxic agents -Follow urine output, BMP -Ensure adequate renal perfusion, optimize oxygenation -Renal dose medications   Intake/Output Summary (Last 24 hours) at 07/09/2021 0738 Last data filed at 07/09/2021 0700 Gross per 24 hour  Intake 3689.12 ml  Output 330 ml  Net 3359.12 ml     NEUROLOGY MRI brain severe brain damage   ENDO - ICU hypoglycemic\Hyperglycemia protocol -check FSBS per protocol   GI GI PROPHYLAXIS as indicated  NUTRITIONAL STATUS DIET-->TF's as tolerated Constipation protocol as indicated   ELECTROLYTES -follow labs as needed -replace as needed -pharmacy consultation and following  Best practice (right click and "Reselect all SmartList Selections" daily)  Diet:  NPO Pain/Anxiety/Delirium protocol (if indicated): Yes (RASS goal 0) VAP protocol (if indicated): Yes DVT prophylaxis: Subcutaneous Heparin GI prophylaxis: PPI Glucose control:  Insulin gtt Central venous access:  Yes, and it is still needed Arterial line:  Yes, and it is still needed Foley:  Yes, and it is still needed Mobility:  bed rest  Code Status:  DNR Disposition: ICU  Labs   CBC: Recent Labs  Lab 07/19/2021 1543 07/07/21 0448 07/08/21 0309 07/09/21 0445  WBC 12.0* 10.2 9.4 11.9*  NEUTROABS 3.8  --   --   --   HGB 12.3 11.4* 10.3* 11.0*  HCT 44.6 37.0 33.7* 36.2  MCV 99.8 90.0 91.6 89.6  PLT 203 127* 119* 138*     Basic Metabolic Panel: Recent Labs  Lab 07/12/2021 1606 07/09/2021 1637 07/21/2021 2145 07/07/21 0016 07/07/21 0448 07/07/21 0730 07/08/21 0309 07/09/21 0445  NA  --    < > 132* 137 138 138 140 142  K  --    < > 4.2 3.5  4.1 3.8 3.7 3.5  CL  --    < > 102 107 107 109 110 113*  CO2  --    < > 21* _0 21*  GLUCOSE  --    < > 412* 225* 142* 214* 197* 97  BUN  --    < > 30* 27* 24* 23* 24* 26*  CREATININE  --    < > 1.07* 0.88 0.65 0.59 0.64 0.54  CALCIUM  --    < > 8.1* 7.6* 7.6* 7.4* 7.8* 7.8*  MG 4.9*  --   --   --  4.3*  --  3.1*  --   PHOS  --   --  4.0  --  2.7  --  3.5  --    < > = values in this interval not displayed.    GFR: Estimated Creatinine Clearance: 93.2 mL/min (by C-G formula  based on SCr of 0.54 mg/dL). Recent Labs  Lab 07/20/2021 1543 06/25/2021 1606 07/13/2021 1811 07/07/21 0008 07/07/21 0448 07/08/21 0309 07/09/21 0445  PROCALCITON  --  0.17  --   --  15.52 8.60  --   WBC 12.0*  --   --   --  10.2 9.4 11.9*  LATICACIDVEN >9.0*  --  7.8* 3.5* 2.3*  --   --      Liver Function Tests: Recent Labs  Lab 06/25/2021 1543 07/07/21 0448 07/08/21 0309 07/09/21 0445  AST 78* 714* 1,215* 1,344*  ALT 44 489* 885* 1,082*  ALKPHOS 312* 209* 164* 185*  BILITOT 0.7 0.3 0.5 0.9  PROT 7.4 5.7* 5.9* 6.4*  ALBUMIN 3.7 3.1* 3.0* 2.9*    Recent Labs  Lab 06/26/2021 1606 07/07/21 0448  LIPASE 96* 34  AMYLASE  --  63    Recent Labs  Lab 06/30/2021 1606 07/07/21 0448  AMMONIA 96* 47*     ABG    Component Value Date/Time   PHART 7.37 07/07/2021 0000   PCO2ART 43 07/07/2021 0000   PO2ART 200 (H) 07/07/2021 0000   HCO3 24.9 07/07/2021 0000   ACIDBASEDEF 0.5 07/07/2021 0000   O2SAT 99.7 07/07/2021 0000      Coagulation Profile: Recent Labs  Lab 07/08/2021 1543  INR 1.2     Cardiac Enzymes: No results for input(s): CKTOTAL, CKMB, CKMBINDEX, TROPONINI in the last 168 hours.  HbA1C: Hgb A1c MFr Bld  Date/Time Value Ref Range Status  07/07/2021 05:00 AM 9.9 (H) 4.8 - 5.6 % Final    Comment:    (NOTE) Pre diabetes:          5.7%-6.4%  Diabetes:              >6.4%  Glycemic control for   <7.0% adults with diabetes   09/30/2020 04:22 PM 9.5 (H) 4.8 - 5.6 % Final     Comment:    (NOTE) Pre diabetes:          5.7%-6.4%  Diabetes:              >6.4%  Glycemic control for   <7.0% adults with diabetes     CBG: Recent Labs  Lab 07/08/21 1613 07/08/21 1917 07/08/21 2353 07/09/21 0419 07/09/21 0726  GLUCAP 131* 102* 119* 101* 102*     Allergies Allergies  Allergen Reactions   Dilaudid [Hydromorphone] Hives   Talwin [Pentazocine]     Turned blue around lips and face, rash all over body       DVT/GI PRX  assessed I Assessed the need for Labs I Assessed the need for Foley I Assessed the need for Central Venous Line Family Discussion when available I Assessed the need for Mobilization I made an Assessment of medications to be adjusted accordingly Safety Risk assessment completed  [place] ICU TEAM   DNR STATUS END OF LIFE VISITATION [place] Time devoted to patient care services described in this note is 45 minutes.  Critical care was necessary to treat /prevent imminent and life-threatening deterioration. Overall, patient is critically ill, prognosis is guarded.  Patient with Multiorgan failure and at high risk for cardiac arrest and death.    Corrin Parker, M.D.  [facility]  Medical Director [place] Director [facility] Cardio-Pulmonary Department

## 2021-07-09 NOTE — Consult Note (Signed)
[place] for Electrolyte Monitoring and Replacement   Recent Labs: Potassium (mmol/L)  Date Value  07/09/2021 3.5  12/14/2012 4.0   Magnesium (mg/dL)  Date Value  07/08/2021 3.1 (H)   Calcium (mg/dL)  Date Value  07/09/2021 7.8 (L)   Calcium, Total (mg/dL)  Date Value  12/14/2012 9.3   Albumin (g/dL)  Date Value  07/09/2021 2.9 (L)   Phosphorus (mg/dL)  Date Value  07/08/2021 3.5   Sodium (mmol/L)  Date Value  07/09/2021 142  12/14/2012 137   Corrected Ca: 8.6 mg/dL  Assessment: 56yo Female w/ h/o HTN, T2DM, NASH cirrhosis, & cholecystomy w/ biliary leak  admitted with toxic megacolon s/p OOH CA in DKA on mechanical ventilatory support and pressors. Pharmacy consulted for electrolyte mgmt ISO DKA.  On MIVF: lactated ringers at 50 mL/hr  Goal of Therapy:  Electrolytes WNL  Plan:   electrolytes WNL at this time and not req'ing repletion. Given [place] pharmacy will sign off of this consultation  Vallery Sa, PharmD, BCPS Clinical Pharmacist 07/09/2021 7:08 AM

## 2021-07-10 DIAGNOSIS — Z515 Encounter for palliative care: Secondary | ICD-10-CM

## 2021-07-10 DIAGNOSIS — Z7189 Other specified counseling: Secondary | ICD-10-CM | POA: Diagnosis not present

## 2021-07-10 DIAGNOSIS — K5931 Toxic megacolon: Secondary | ICD-10-CM | POA: Diagnosis not present

## 2021-07-10 LAB — GLUCOSE, CAPILLARY
Glucose-Capillary: 82 mg/dL (ref 70–99)
Glucose-Capillary: 87 mg/dL (ref 70–99)
Glucose-Capillary: 88 mg/dL (ref 70–99)

## 2021-07-10 MED ORDER — GLYCOPYRROLATE 0.2 MG/ML IJ SOLN
0.2000 mg | INTRAMUSCULAR | Status: DC | PRN
  Administered 2021-07-10: 0.2 mg via INTRAVENOUS
  Filled 2021-07-10: qty 1

## 2021-07-10 MED ORDER — ACETAMINOPHEN 650 MG RE SUPP: 650.0000 mg | Freq: Four times a day (QID) | RECTAL | Status: DC | PRN

## 2021-07-10 MED ORDER — GLYCOPYRROLATE 1 MG PO TABS
1.0000 mg | ORAL_TABLET | ORAL | Status: DC | PRN
  Filled 2021-07-10: qty 1

## 2021-07-10 MED ORDER — SCOPOLAMINE 1 MG/3DAYS TD PT72
1.0000 | MEDICATED_PATCH | TRANSDERMAL | Status: DC
  Administered 2021-07-10: 1.5 mg via TRANSDERMAL
  Filled 2021-07-10: qty 1

## 2021-07-10 MED ORDER — ONDANSETRON 4 MG PO TBDP
4.0000 mg | ORAL_TABLET | Freq: Four times a day (QID) | ORAL | Status: DC | PRN
  Filled 2021-07-10: qty 1

## 2021-07-10 MED ORDER — HALOPERIDOL LACTATE 2 MG/ML PO CONC
0.5000 mg | ORAL | Status: DC | PRN
  Filled 2021-07-10: qty 0.3

## 2021-07-10 MED ORDER — FENTANYL 2500MCG IN NS 250ML (10MCG/ML) PREMIX INFUSION: 0.0000 ug/h | INTRAVENOUS | Status: DC

## 2021-07-10 MED ORDER — BIOTENE DRY MOUTH MT LIQD: 15.0000 mL | OROMUCOSAL | Status: DC | PRN

## 2021-07-10 MED ORDER — FENTANYL 2500MCG IN NS 250ML (10MCG/ML) PREMIX INFUSION
INTRAVENOUS | Status: AC
  Administered 2021-07-10: 10 ug/h via INTRAVENOUS
  Filled 2021-07-10: qty 250

## 2021-07-10 MED ORDER — POLYVINYL ALCOHOL 1.4 % OP SOLN
1.0000 [drp] | Freq: Four times a day (QID) | OPHTHALMIC | Status: DC | PRN
  Filled 2021-07-10: qty 15

## 2021-07-10 MED ORDER — ACETAMINOPHEN 325 MG PO TABS: 650.0000 mg | ORAL_TABLET | Freq: Four times a day (QID) | ORAL | Status: DC | PRN

## 2021-07-10 MED ORDER — GLYCOPYRROLATE 0.2 MG/ML IJ SOLN: 0.2000 mg | INTRAMUSCULAR | Status: DC | PRN

## 2021-07-10 MED ORDER — HALOPERIDOL 0.5 MG PO TABS
0.5000 mg | ORAL_TABLET | ORAL | Status: DC | PRN
  Filled 2021-07-10: qty 1

## 2021-07-10 MED ORDER — FENTANYL 2500MCG IN NS 250ML (10MCG/ML) PREMIX INFUSION: 30.0000 ug/h | INTRAVENOUS | Status: DC

## 2021-07-10 MED ORDER — FENTANYL BOLUS VIA INFUSION
30.0000 ug | INTRAVENOUS | Status: DC | PRN
  Administered 2021-07-10 (×3): 30 ug via INTRAVENOUS
  Filled 2021-07-10: qty 30

## 2021-07-10 MED ORDER — ONDANSETRON HCL 4 MG/2ML IJ SOLN: 4.0000 mg | Freq: Four times a day (QID) | INTRAMUSCULAR | Status: DC | PRN

## 2021-07-10 MED ORDER — HALOPERIDOL LACTATE 5 MG/ML IJ SOLN: 0.5000 mg | INTRAMUSCULAR | Status: DC | PRN

## 2021-07-16 ENCOUNTER — Ambulatory Visit: Payer: Medicare Other | Admitting: Gastroenterology

## 2021-07-18 ENCOUNTER — Ambulatory Visit: Payer: Medicare Other | Admitting: Sports Medicine

## 2021-07-23 NOTE — Progress Notes (Signed)
Patient extubated to room air with family present.  Fentanyl and Versed started for patient comfort.

## 2021-07-23 NOTE — Progress Notes (Signed)
Family has made the decision to withdraw care on patient.  Waiting on Palliative to see patient.

## 2021-07-23 NOTE — Progress Notes (Addendum)
Daily Progress Note   Patient Name: Kim Hicks       Date: 07/15/2021 DOB: 03-Nov-1965  Age: 56 y.o. MRN#: [id number] Attending Physician: Flora Lipps, MD Primary Care Physician: [name], FNP Admit Date: 07/02/2021  Reason for Consultation/Follow-up: Establishing goals of care  Subjective: Patient is resting in bed on ventilator. Husband and other family members are at bedside. Discussed diagnoses and poor prognosis. Husband states they are ready to extubate and shift to comfort focused care. We discussed steps of comfort care. Discussed prognosis of hours. Questions answered.   Discussed allergies with husband. He states he is not completely sure, but believes she may be allergic to Morphine. Dilaudid is listed in system. Will initiate a Fentanyl infusion with boluses. Continue Versed as needed. Other medications placed for comfort. Chaplain is at bedside.  I completed a MOST form today with husband and the signed original was placed in the chart. A photocopy was placed in the chart to be scanned into EMR. The patient outlined their wishes for the following treatment decisions:  Cardiopulmonary Resuscitation: Do Not Attempt Resuscitation (DNR/No CPR)  Medical Interventions: Comfort Measures: Keep clean, warm, and dry. Use medication by any route, positioning, wound care, and other measures to relieve pain and suffering. Use oxygen, suction and manual treatment of airway obstruction as needed for comfort. Do not transfer to the hospital unless comfort needs cannot be met in current location.  Antibiotics: No antibiotics (use other measures to relieve symptoms)  IV Fluids: No IV fluids (provide other measures to ensure comfort)  Feeding Tube: No feeding tube      Length of Stay:  4  Current Medications: Scheduled Meds:    Continuous Infusions:  fentaNYL infusion INTRAVENOUS 10 mcg/hr (July 15, 2021 1151)   levETIRAcetam Stopped (07/15/21 0026)   midazolam 4 mg/hr (2021/07/15 1208)    PRN Meds: acetaminophen **OR** acetaminophen, albuterol, antiseptic oral rinse, fentaNYL, glycopyrrolate **OR** glycopyrrolate **OR** glycopyrrolate, haloperidol **OR** haloperidol **OR** haloperidol lactate, midazolam, ondansetron **OR** ondansetron (ZOFRAN) IV, polyvinyl alcohol  Physical Exam Constitutional:      Comments: Eyes closed. On ventilator.             Vital Signs: BP (!) 141/68    Pulse 90    Temp 98.6 F (37 C) (Esophageal)    Resp (!) 22  Ht 5' 5"  (1.651 m)    Wt 102.6 kg    SpO2 99%    BMI 37.64 kg/m  SpO2: SpO2: 99 % O2 Device: O2 Device: Ventilator O2 Flow Rate:    Intake/output summary:  Intake/Output Summary (Last 24 hours) at July 23, 2021 1219 Last data filed at 23-Jul-2021 1105 Gross per 24 hour  Intake 2866.98 ml  Output 600 ml  Net 2266.98 ml   LBM: Last BM Date : Jul 23, 2021 Baseline Weight: Weight: 105.2 kg Most recent weight: Weight: 102.6 kg       Palliative Assessment/Data:      Patient Active Problem List   Diagnosis Date Noted   AKI (acute kidney injury) ([place]) 07/07/2021   Endotracheally intubated 07/07/2021   On mechanically assisted ventilation ([place]) 07/07/2021   Acute encephalopathy 07/07/2021   Transaminitis 07/07/2021   Diabetic ketoacidosis with coma associated with type 2 diabetes mellitus ([place])    Toxic megacolon ([place]) 06/26/2021   Cardiac arrest (HCC)    RLS (restless legs syndrome) 02/17/2021   Anemia 12/03/2020   Benign neoplasm of rectum    AVM (arteriovenous malformation) of colon, acquired    Portal hypertensive gastropathy ([place])    On home oxygen therapy 11/05/2020   Biliary drain displacement 11/01/2020   Iron deficiency anemia 10/03/2020   Common migraine with intractable migraine 65/68/1275   Metabolic  encephalopathy    Septic shock ([place]) 07/06/2020   Acute respiratory failure with hypoxemia ([place]) 07/06/2020   Pressure injury of skin 07/04/2020   Post-ERCP acute pancreatitis 06/27/2020   Pancreatitis 06/27/2020   Abscess of gallbladder    Migration of biliary stent    Malnutrition of moderate degree 05/23/2020   Abdominal pain 05/21/2020   Biloma 05/20/2020   Biliary sludge    Complicated UTI (urinary tract infection) 03/11/2020   Encounter for removal of biliary stent    Diabetic peripheral neuropathy ([place]) 09/14/2019   Gait disturbance 09/14/2019   NASH (nonalcoholic steatohepatitis) 08/21/2019   Bile leak    Other cirrhosis of liver ([place]) 08/04/2019   Major depressive disorder, recurrent episode, severe, with psychotic behavior ([place]) 03/25/2011   DYSPHAGIA UNSPECIFIED 03/04/2009   GERD 02/18/2009   PERSISTENT VOMITING 02/18/2009   CHEST PAIN 02/18/2009   NAUSEA AND VOMITING 02/18/2009   Dehydration 07/19/2007   GASTROENTERITIS 07/19/2007   FIBROIDS, UTERUS 05/02/2007   Depression 05/02/2007   MIGRAINE HEADACHE 05/02/2007   Hypertension 05/02/2007   HEMORRHOIDS 05/02/2007   Chronic constipation 05/02/2007   IBS 05/02/2007   PSORIASIS 05/02/2007   PITUITARY NEOPLASM, HX OF 05/02/2007   LIVER FUNCTION TESTS, ABNORMAL, HX OF 05/02/2007   Diabetes mellitus without complication ([place]) 17/00/1749   Hyperlipidemia associated with type 2 diabetes mellitus ([place]) 02/04/2007   SINUSITIS, ACUTE 02/04/2007   Abdominal pain, right upper quadrant 02/04/2007   Hyperlipidemia 02/04/2007    Palliative Care Assessment & Plan   Recommendations/Plan: Extubate and shift to comfort care.  Fentanyl infusion with boluses ordered. Continue Versed with adjustments as needed.  Other comfort orders/medications ordered. Anticipate hospital death.   Code Status:    Code Status Orders  (From admission, onward)           Start     Ordered   07/23/2021 1131  Do not attempt  resuscitation (DNR)  Continuous       Question Answer Comment  In the event of cardiac or respiratory ARREST Do not call a code blue   In the event of cardiac or respiratory ARREST Do not perform Intubation,  CPR, defibrillation or ACLS   In the event of cardiac or respiratory ARREST Use medication by any route, position, wound care, and other measures to relive pain and suffering. May use oxygen, suction and manual treatment of airway obstruction as needed for comfort.   Comments MOST form on chart.      2021-07-30 1139           Code Status History     Date Active Date Inactive Code Status Order ID Comments User Context   07/08/2021 1843 2021/07/30 1139 DNR [id number]  Flora Lipps, MD Inpatient   06/30/2021 2018 07/08/2021 1842 Full Code [id number]  Rust-Chester, Huel Cote, NP ED   11/01/2020 2157 11/02/2020 0357 Full Code [id number]  Howerter, Ethelda Chick, DO ED   09/30/2020 1539 10/03/2020 2006 Full Code [id number]  Collier Bullock, MD Inpatient   06/27/2020 2057 07/13/2020 1701 Full Code [id number]  Toy Baker, MD ED   05/20/2020 2229 05/30/2020 1935 Full Code [id number]  Lenore Cordia, MD ED   08/04/2019 1139 08/11/2019 1619 Full Code [id number]  [name], MD Inpatient       Prognosis:  Hours - Days   Care plan was discussed with CCM, RN, TOC  Thank you for allowing the Palliative Medicine Team to assist in the care of this patient.       Greater than 50%  of this time was spent counseling and coordinating care related to the above assessment and plan.  Asencion Gowda, NP  Please contact Palliative Medicine Team phone at [phone] for questions and concerns.

## 2021-07-23 NOTE — Death Summary Note (Signed)
DEATH SUMMARY   Patient Details  Name: Kim Hicks MRN: [id number] DOB: 1966-04-16  Admission/Discharge Information   Admit Date:  07/20/2021  Date of Death: Date of Death: 07/14/21  Time of Death: Time of Death: Jul 06, 2052  Length of Stay: 4  Referring Physician: [name], FNP   Reason(s) for Hospitalization  Out of Hospital Cardiac Arrest  Diagnoses  Preliminary cause of death: severe sepsis with shock and multiorgan failure secondary to Toxic Megacolon Secondary Diagnoses (including complications and co-morbidities):  Principal Problem:   Toxic megacolon ([place]) Active Problems:   Other cirrhosis of liver ([place])   Septic shock ([place])   Acute respiratory failure with hypoxemia ([place])   Cardiac arrest ([place])   Diabetic ketoacidosis with coma associated with type 2 diabetes mellitus ([place])   AKI (acute kidney injury) ([place])   Endotracheally intubated   On mechanically assisted ventilation ([place])   Acute encephalopathy   Transaminitis   Brief Hospital Course (including significant findings, care, treatment, and services provided and events leading to death)  Kim Hicks is a 56 y.o. year old female who presented to [facility] ED from home with her husband via EMS as CPR in progress. (History and events gathered from ED documentation & husband, [name], at bedside) Patient was in her normal state of health until waking up on the morning of July 10, 2021 with significant abdominal distension & LUQ discomfort. She began having dyspnea and her husband stepped out of the room to get a fan at which point he heard her collapse on the living room floor. EMS was called finding the patient down for about 10 minutes when they arrived and initial rhythm was asystole, CPR initiated around 14:45 per EMS report. King airway placed in field. In transport ROSC was achieved briefly before patient again pulseless in asystole.  Patient arrived CPR in progress around 15:30. Transport was estimated at about 45 minutes.  ED  course: ROSC documented at 15:40 shortly after arrival. Patient emergently intubated requiring mechanical ventilatory support. Initial work up revealed DKA with acidosis & CBG > 700, septic protocol initiated with initial lactic >9. Patient noted to have severe abdominal distention firm to touch. CT imaging showing toxic megacolon. Dr. Lysle Pearl from general surgery assessed the patient at bedside and attempted manual decompression with minimal effect; patient was deemed too unstable for surgery. PCCM consulted for admission to the ICU.  Hospital Course: Patient was admitted to the ICU with suspected sepsis with septic shock due to Toxic megacolon without perforation, Acute hypoxic respiratory failure in the setting of Cardiac Arrest and Aspiration Pneumonia with multiorgan failure and suspected anoxic brain injury. Neurology consulted due to poor neurological exam  with no brain stem reflexes and suspected anoxic brain injury. On 2/13, EEG showed continuous generalized suppression consistent with a severe encephalopathy and MRI revealed  diffuse cortical restricted diffusion mostly in posterior hemispheres and cerebellum. The history, exam and ancillary testing was consistent with very poor prognosis for any meaningful recovery. This was relayed to family members who consented to DNR without escalation of care with plans for  comfort care measures only.On 07/14/22, patient failed weaning trials due to severe brain damage. Palliative care consulted to establish goals of care and after further consideration, family decided to withdraw care with plans for terminal extubation on 07-14-2022. Patient was terminally extubated on 2022-07-14 _0 :02 pm and passed away at July 06, 2052 with family at bedside.  Pertinent Labs and Studies  Significant Diagnostic Studies DG Abd 1 View  Result Date: 06/30/2021 CLINICAL  DATA:  Abdominal pain with nausea. EXAM: ABDOMEN - 1 VIEW COMPARISON:  Abdominal x-ray 05/30/2020. FINDINGS: Percutaneous right upper  quadrant catheter and previously identified stent in the right upper quadrant are no longer seen. Cholecystectomy clips persist. There is a very large amount of stool throughout the entire colon. The colon is diffusely dilated and stool filled. No dilated small bowel loops seen. Osseous structures are within normal limits. No suspicious calcifications. There are surgical clips in the right hemipelvis. IMPRESSION: 1. Removal of right-sided abdominal stent and right upper quadrant percutaneous catheter. 2. The entire colon is dilated and filled with a very large amount of stool concerning for constipation and/or colonic ileus. Electronically Signed   By: Ronney Asters M.D.   On: 06/30/2021 20:29   CT Head Wo Contrast  Result Date: 07/16/2021 CLINICAL DATA:  Altered mental status.  Reported cardiac arrest. EXAM: CT HEAD WITHOUT CONTRAST TECHNIQUE: Contiguous axial images were obtained from the base of the skull through the vertex without intravenous contrast. RADIATION DOSE REDUCTION: This exam was performed according to the departmental dose-optimization program which includes automated exposure control, adjustment of the mA and/or kV according to patient size and/or use of iterative reconstruction technique. COMPARISON:  11/18/2020. FINDINGS: Brain: No evidence of acute infarction, hemorrhage, hydrocephalus, extra-axial collection or mass lesion/mass effect. Vascular: No hyperdense vessel or unexpected calcification. Skull: Normal. Negative for fracture or focal lesion. Sinuses/Orbits: Globes and orbits are unremarkable. Small amount of dependent fluid in the left maxillary and both sphenoid sinuses, nonspecific in this intubated patient. Other: None. IMPRESSION: 1. No intracranial abnormalities. Electronically Signed   By: Lajean Manes M.D.   On: 07/18/2021 17:21   CT Angio Chest PE W/Cm &/Or Wo Cm  Result Date: 07/16/2021 CLINICAL DATA:  Pulmonary embolism (PE) suspected, high prob; Peritonitis or  perforation suspected Cardiac arrest. EXAM: CT ANGIOGRAPHY CHEST CT ABDOMEN AND PELVIS WITH CONTRAST TECHNIQUE: Multidetector CT imaging of the chest was performed using the standard protocol during bolus administration of intravenous contrast. Multiplanar CT image reconstructions and MIPs were obtained to evaluate the vascular anatomy. Multidetector CT imaging of the abdomen and pelvis was performed using the standard protocol during bolus administration of intravenous contrast. RADIATION DOSE REDUCTION: This exam was performed according to the departmental dose-optimization program which includes automated exposure control, adjustment of the mA and/or kV according to patient size and/or use of iterative reconstruction technique. CONTRAST:  132m OMNIPAQUE IOHEXOL 350 MG/ML SOLN COMPARISON:  CT abdomen pelvis 11/18/2020 FINDINGS: CTA CHEST FINDINGS Lines and tubes: Endotracheal tube terminating 2.5 cm above the carina. Cardiovascular: Satisfactory opacification of the pulmonary arteries to the segmental level. No evidence of pulmonary embolism. Prominent heart size. No significant pericardial effusion. The thoracic aorta is normal in caliber. No atherosclerotic plaque of the thoracic aorta. No coronary artery calcifications. Mediastinum/Nodes: No enlarged mediastinal, hilar, or axillary lymph nodes. Thyroid gland, trachea, and esophagus demonstrate no significant findings. Lungs/Pleura: Bilateral lower lobe subsegmental atelectasis. Right lower lobe peribronchovascular consolidation. No pulmonary nodule. No pulmonary mass. No pleural effusion. No pneumothorax. Musculoskeletal: No chest wall abnormality. Multiple subacute to chronic bilateral rib fractures. Query some acute minimally displaced rib fractures: Left lateral 5 and 6, left posterolateral 7, right anterior second, right anterolateral 3 - 6 rib fractures. Acute minimally displaced sternal fracture. No suspicious lytic or blastic osseous lesions. No acute  displaced fracture. Review of the MIP images confirms the above findings. CT ABDOMEN and PELVIS FINDINGS Hepatobiliary: Query nodular hepatic contour with a prominent caudate lobe. Marked  hepatic parenchyma heterogeneity with query underlying mass lesion. Cholelithiasis. No gallbladder wall thickening or pericholecystic fluid. No biliary dilatation. Pancreas: No focal lesion. Normal pancreatic contour. No surrounding inflammatory changes. No main pancreatic ductal dilatation. Spleen: The spleen measures up to 14 cm.  No focal lesion. Adrenals/Urinary Tract: No adrenal nodule bilaterally. Bilateral kidneys enhance symmetrically. No hydronephrosis. No hydroureter. The urinary bladder is unremarkable. Foley catheter tip and balloon terminates within the urinary bladder lumen. Stomach/Bowel: Enteric tube with tip and side port terminating within the gastric lumen. Stomach is within normal limits. No evidence of small bowel wall thickening or dilatation. Marked gas and fluid dilatation of the entire colon including the rectum with caliber up to 10 cm. No definite transition point. No pneumatosis. Appendix appears normal. Vascular/Lymphatic: No abdominal aorta or iliac aneurysm. No abdominal, pelvic, or inguinal lymphadenopathy. Reproductive: Status post hysterectomy. No adnexal masses. Other: No intraperitoneal free fluid. No intraperitoneal free gas. No organized fluid collection. Musculoskeletal: No abdominal wall hernia or abnormality. No suspicious lytic or blastic osseous lesions. No acute displaced fracture. Review of the MIP images confirms the above findings. IMPRESSION: 1. No pulmonary embolus. 2. Right lower lobe peribronchovascular consolidation. Developing infection/inflammation not excluded. Limited evaluation due to timing of contrast on this study. 3. Query some acute minimally displaced rib fractures: Left lateral 5-6, left posterolateral 7, right anterior 2, right anterolateral 3-6 rib fractures.  Multiple subacute to chronic bilateral rib fractures. 4. Acute minimally displaced sternal fracture. 5. Marked gas and fluid dilatation of the large bowel measuring up to 10 cm. No transition point identified. Ogilvie syndrome cannot be fully excluded. No perforation. 6. Cirrhosis with portal hypertension and marked hepatic parenchyma heterogeneity suggestive of underlying mass lesion. Please note that liver protocol enhanced MR and CT are the most sensitive tests for the screening detection of hepatocellular carcinoma in the high risk setting of cirrhosis. When the patient is clinically stable and able to follow directions and hold their breath (preferably as an outpatient) further evaluation with dedicated abdominal MRI should be considered. 7. Cholelithiasis. Electronically Signed   By: Iven Finn M.D.   On: 07/14/2021 17:39   MR BRAIN WO CONTRAST  Result Date: 07/07/2021 CLINICAL DATA:  Anoxic brain injury EXAM: MRI HEAD WITHOUT CONTRAST TECHNIQUE: Multiplanar, multiecho pulse sequences of the brain and surrounding structures were obtained without intravenous contrast. COMPARISON:  None. FINDINGS: Brain: Abnormal cortical diffusion characteristics within both hemispheres, predominantly involving the posterior hemispheres and cerebellum. There is also abnormal signal within the caudate nuclei. No acute or chronic hemorrhage. The midline structures are normal. Vascular: Major flow voids are preserved. Skull and upper cervical spine: Normal calvarium and skull base. Visualized upper cervical spine and soft tissues are normal. Sinuses/Orbits:No paranasal sinus fluid levels or advanced mucosal thickening. No mastoid or middle ear effusion. Normal orbits. IMPRESSION: Hypoxic ischemic injury.  No herniation or midline shift. Electronically Signed   By: Ulyses Jarred M.D.   On: 07/07/2021 22:38   CT Abdomen Pelvis W Contrast  Result Date: 07/11/2021 CLINICAL DATA:  Pulmonary embolism (PE) suspected, high  prob; Peritonitis or perforation suspected Cardiac arrest. EXAM: CT ANGIOGRAPHY CHEST CT ABDOMEN AND PELVIS WITH CONTRAST TECHNIQUE: Multidetector CT imaging of the chest was performed using the standard protocol during bolus administration of intravenous contrast. Multiplanar CT image reconstructions and MIPs were obtained to evaluate the vascular anatomy. Multidetector CT imaging of the abdomen and pelvis was performed using the standard protocol during bolus administration of intravenous contrast. RADIATION DOSE REDUCTION: This  exam was performed according to the departmental dose-optimization program which includes automated exposure control, adjustment of the mA and/or kV according to patient size and/or use of iterative reconstruction technique. CONTRAST:  159m OMNIPAQUE IOHEXOL 350 MG/ML SOLN COMPARISON:  CT abdomen pelvis 11/18/2020 FINDINGS: CTA CHEST FINDINGS Lines and tubes: Endotracheal tube terminating 2.5 cm above the carina. Cardiovascular: Satisfactory opacification of the pulmonary arteries to the segmental level. No evidence of pulmonary embolism. Prominent heart size. No significant pericardial effusion. The thoracic aorta is normal in caliber. No atherosclerotic plaque of the thoracic aorta. No coronary artery calcifications. Mediastinum/Nodes: No enlarged mediastinal, hilar, or axillary lymph nodes. Thyroid gland, trachea, and esophagus demonstrate no significant findings. Lungs/Pleura: Bilateral lower lobe subsegmental atelectasis. Right lower lobe peribronchovascular consolidation. No pulmonary nodule. No pulmonary mass. No pleural effusion. No pneumothorax. Musculoskeletal: No chest wall abnormality. Multiple subacute to chronic bilateral rib fractures. Query some acute minimally displaced rib fractures: Left lateral 5 and 6, left posterolateral 7, right anterior second, right anterolateral 3 - 6 rib fractures. Acute minimally displaced sternal fracture. No suspicious lytic or blastic  osseous lesions. No acute displaced fracture. Review of the MIP images confirms the above findings. CT ABDOMEN and PELVIS FINDINGS Hepatobiliary: Query nodular hepatic contour with a prominent caudate lobe. Marked hepatic parenchyma heterogeneity with query underlying mass lesion. Cholelithiasis. No gallbladder wall thickening or pericholecystic fluid. No biliary dilatation. Pancreas: No focal lesion. Normal pancreatic contour. No surrounding inflammatory changes. No main pancreatic ductal dilatation. Spleen: The spleen measures up to 14 cm.  No focal lesion. Adrenals/Urinary Tract: No adrenal nodule bilaterally. Bilateral kidneys enhance symmetrically. No hydronephrosis. No hydroureter. The urinary bladder is unremarkable. Foley catheter tip and balloon terminates within the urinary bladder lumen. Stomach/Bowel: Enteric tube with tip and side port terminating within the gastric lumen. Stomach is within normal limits. No evidence of small bowel wall thickening or dilatation. Marked gas and fluid dilatation of the entire colon including the rectum with caliber up to 10 cm. No definite transition point. No pneumatosis. Appendix appears normal. Vascular/Lymphatic: No abdominal aorta or iliac aneurysm. No abdominal, pelvic, or inguinal lymphadenopathy. Reproductive: Status post hysterectomy. No adnexal masses. Other: No intraperitoneal free fluid. No intraperitoneal free gas. No organized fluid collection. Musculoskeletal: No abdominal wall hernia or abnormality. No suspicious lytic or blastic osseous lesions. No acute displaced fracture. Review of the MIP images confirms the above findings. IMPRESSION: 1. No pulmonary embolus. 2. Right lower lobe peribronchovascular consolidation. Developing infection/inflammation not excluded. Limited evaluation due to timing of contrast on this study. 3. Query some acute minimally displaced rib fractures: Left lateral 5-6, left posterolateral 7, right anterior 2, right anterolateral  3-6 rib fractures. Multiple subacute to chronic bilateral rib fractures. 4. Acute minimally displaced sternal fracture. 5. Marked gas and fluid dilatation of the large bowel measuring up to 10 cm. No transition point identified. Ogilvie syndrome cannot be fully excluded. No perforation. 6. Cirrhosis with portal hypertension and marked hepatic parenchyma heterogeneity suggestive of underlying mass lesion. Please note that liver protocol enhanced MR and CT are the most sensitive tests for the screening detection of hepatocellular carcinoma in the high risk setting of cirrhosis. When the patient is clinically stable and able to follow directions and hold their breath (preferably as an outpatient) further evaluation with dedicated abdominal MRI should be considered. 7. Cholelithiasis. Electronically Signed   By: MIven FinnM.D.   On: 07/05/2021 17:39   DG Chest Port 1 View  Result Date: 07/01/2021 CLINICAL DATA:  Central  line placement. EXAM: PORTABLE CHEST 1 VIEW COMPARISON:  Chest radiograph dated 07/12/2021. FINDINGS: Right IJ central venous line with tip with the right atrium. No pneumothorax. Endotracheal tube remains above the carina and enteric tube extends below the diaphragm with tip beyond the inferior margin of the image. Bilateral pulmonary streaky densities. No pleural effusion pneumothorax. Stable cardiomegaly. No acute osseous pathology. IMPRESSION: 1. Right IJ central venous line with tip with the right atrium. No pneumothorax. 2. Bilateral pulmonary streaky densities. Electronically Signed   By: Anner Crete M.D.   On: 07/04/2021 23:31   DG Chest Portable 1 View  Result Date: 07/15/2021 CLINICAL DATA:  ET AND OG placement, Pt to ED via [facility] from home. Patient to ED CPR in progress. 2 rounds of CPR and 3 epi's by EMS, hypertension, diabetes EXAM: PORTABLE CHEST 1 VIEW COMPARISON:  09/30/2020 and older exams.  CT, 11/07/2019. FINDINGS: Endotracheal tube tip projects 1.7 cm above the  carinal. Nasal/orogastric tube passes well below the diaphragm, into the stomach and below the included field of view. Cardiac silhouette is mildly enlarged. No mediastinal or hilar masses. There are linear opacities in the mid and lower lungs consistent with atelectasis, accentuated by low lung volumes. Remainder of the lungs is clear. No convincing pleural effusion or pneumothorax. Skeletal structures are grossly intact. IMPRESSION: 1. No acute cardiopulmonary disease. 2. Linear atelectasis in the mid and lower lungs accentuated by low lung volumes. 3. Well-positioned endotracheal tube and nasal/orogastric tube. Electronically Signed   By: Lajean Manes M.D.   On: 07/04/2021 16:15   DG ERCP  Result Date: 06/24/2021 CLINICAL DATA:  ERCP EXAM: ERCP TECHNIQUE: Multiple spot images obtained with the fluoroscopic device and submitted for interpretation post-procedure. FLUOROSCOPY TIME:  Refer to procedure report COMPARISON:  None. FINDINGS: A total of 12 fluoroscopic spot images and 2 cine fluoro loops are submitted for review taken during ERCP. Initial images demonstrate a scope overlying the upper abdomen with a plastic biliary stent in place. The stent appears then removed. Catheterization of the common bile duct is performed. Contrast injection opacifies both the intrahepatic and extrahepatic biliary tree. The intrahepatic biliary tree appears normal in caliber. Submitted cine loops demonstrate balloon sweeps of the common bile duct. No definite filling defects identified after balloon sweeps. IMPRESSION: ERCP as described.  Refer to procedure report for full details. These images were submitted for radiologic interpretation only. Please see the procedural report for the amount of contrast and the fluoroscopy time utilized. Electronically Signed   By: Albin Felling M.D.   On: 06/24/2021 08:50   DG Abd Portable 1V  Result Date: 07/08/2021 CLINICAL DATA:  Ileus. EXAM: PORTABLE ABDOMEN - 1 VIEW COMPARISON:   Abdominal x-ray dated July 06, 2021. FINDINGS: Unchanged enteric tube in the stomach. Persistent but improved diffuse colonic distention. IMPRESSION: 1. Improving diffuse colonic distention. Electronically Signed   By: Titus Dubin M.D.   On: 07/08/2021 14:39   DG Abd Portable 1 View  Result Date: 07/13/2021 CLINICAL DATA:  ET AND OG placement, Pt to ED via [facility] from home. Patient to ED CPR in progress. 2 rounds of CPR and 3 epi's by EMS, hypertension, diabetes EXAM: PORTABLE ABDOMEN - 1 VIEW COMPARISON:  Orogastric tube placement. FINDINGS: Tip of the nasal/orogastric tube projects in the distal stomach. There is diffuse colonic distension with increased air. The degree of bowel distension is similar to the exam dated 06/30/2021. Stool appears decreased. IMPRESSION: Well-positioned nasal/orogastric tube. Electronically Signed   By: Shanon Brow  Ormond M.D.   On: 06/25/2021 16:22   DG Foot Complete Left  Result Date: 06/17/2021 Please see detailed radiograph report in office note.  EEG adult  Result Date: 07/07/2021 Gwinda Maine, MD     07/08/2021 10:36 AM EEG Inpatient Electroencephalogram Report Clinical Neurophysiology Services Patient Name: Skylyn Slezak Age: 56 y.o. Sex: female MRN: [id number] Date: 07/07/2021 Location: Inpatient ICU 1 Neurologist:  Lynnae Sandhoff, MD Clinical Information ANGELICE PIECH is a 56 y.o. female with asystole approximately 45-55 minutes until ROSC. Medications Current Facility-Administered Medications Medication Dose Route Frequency Provider Last Rate Last Admin  0.9 %  sodium chloride infusion  250 mL Intravenous Continuous Blake Divine, MD   Held at 07/07/2021 1641  albuterol (PROVENTIL) (2.5 MG/3ML) 0.083% nebulizer solution 2.5 mg  2.5 mg Nebulization Q4H PRN Rust-Chester, Toribio Harbour L, NP      chlorhexidine gluconate (MEDLINE KIT) (PERIDEX) 0.12 % solution 15 mL  15 mL Mouth Rinse BID Ottie Glazier, MD   15 mL at 07/07/21 0955  Chlorhexidine Gluconate Cloth 2 % PADS  6 each  6 each Topical Daily Ottie Glazier, MD   6 each at 07/07/21 0955  dextrose 5 % in lactated ringers infusion   Intravenous Continuous Blake Divine, MD 125 mL/hr at 07/07/21 1200 Infusion Verify at 07/07/21 1200  dextrose 50 % solution 0-50 mL  0-50 mL Intravenous PRN Blake Divine, MD      famotidine (PEPCID) IVPB 20 mg premix  20 mg Intravenous Q12H Rust-Chester, Britton L, NP 10 mL/hr at 07/07/21 1200 Infusion Verify at 07/07/21 1200  feeding supplement (VITAL HIGH PROTEIN) liquid 1,000 mL  1,000 mL Per Tube Q24H Flora Lipps, MD      fentaNYL (SUBLIMAZE) injection 25-50 mcg  25-50 mcg Intravenous Q2H PRN Rust-Chester, Britton L, NP   50 mcg at 07/07/21 1152  free water 30 mL  30 mL Per Tube Q4H Flora Lipps, MD      heparin injection 5,000 Units  5,000 Units Subcutaneous Q8H Rust-Chester, Britton L, NP   5,000 Units at 07/07/21 0546  insulin aspart (novoLOG) injection 2-6 Units  2-6 Units Subcutaneous Q4H Beers, Brandon D, RPH      insulin detemir (LEVEMIR) injection 12 Units  12 Units Subcutaneous Q12H [name], RPH   12 Units at 07/07/21 1236  insulin regular, human (MYXREDLIN) 100 units/ 100 mL infusion   Intravenous Continuous Blake Divine, MD 3.6 mL/hr at 07/07/21 1200 3.6 Units/hr at 07/07/21 1200  lactated ringers infusion   Intravenous Continuous Blake Divine, MD   Stopped at 07/07/21 0045  levETIRAcetam (KEPPRA) IVPB 500 mg/100 mL premix  500 mg Intravenous Q12H Rust-Chester, Britton L, NP 400 mL/hr at 07/07/21 1240 500 mg at 07/07/21 1240  MEDLINE mouth rinse  15 mL Mouth Rinse 10 times per day Ottie Glazier, MD   15 mL at 07/07/21 1243  midazolam (VERSED) injection 1 mg  1 mg Intravenous Q2H PRN Blake Divine, MD   1 mg at 07/05/2021 1808  [START ON 07/08/2021] multivitamin liquid 15 mL  15 mL Per Tube Daily Flora Lipps, MD      norepinephrine (LEVOPHED) 39m in 2535m(0.016 mg/mL) premix infusion  0-40 mcg/min Intravenous Titrated Rust-Chester, Britton L, NP 22.5 mL/hr at  07/09/2021 2355 6 mcg/min at 07/04/2021 2355  piperacillin-tazobactam (ZOSYN) IVPB 3.375 g  3.375 g Intravenous Q8H Beers, Shanon BrowRPH 12.5 mL/hr at 07/07/21 1252 3.375 g at 07/07/21 1252  propofol (DIPRIVAN) 1000 MG/100ML infusion  5-50 mcg/kg/min Intravenous Titrated Rust-Chester,  Huel Cote, NP 11.63 mL/hr at 07/07/21 1243 20 mcg/kg/min at 07/07/21 1243 Facility-Administered Medications Ordered in Other Encounters Medication Dose Route Frequency Provider Last Rate Last Admin  midazolam (VERSED) 5 MG/5ML injection   Intravenous Anesthesia Intra-op Cleda Daub, CRNA   2 mg at 02/24/21 1258 Recording Conditions This is a routine less than one hour EEG recording.  The EEG was performed utilizing standard international 10-20 system of electrode placement, with additional channels monitored for eye movement.  One channel electrocardiogram was monitored.  Data were obtained, stored, and interpreted according to ACNS guidelines (J Clin Neurophysiol 2006;23(2):85-183) utilizing referential montage recording, with reformatting to longitudinal, transverse bipolar, and referential montages as necessary for interpretation, along with digital/automated EEG analysis.  Patient tolerated entire procedure well.  Photic stimulation and hyperventilation were utilized as activation procedures unless otherwise specified below. EEG Description Background Activity: No clear posterior-dominant background activity was present. Continuously suppressed voltage with no definite cerebral activity seen during the recording. Paroxysmal Abnormalities: No paroxysmal abnormalities were noted throughout the recording. Drowsiness/Sleep: No normal sleep architecture appreciated. Stimulation: Hyperventilation and photic stimulation were not performed. ECG Recording: No ECG abnormalities were noted. Recording Quality: Excessive EMG artifact throughout the entire recording. EEG Interpretation This is an abnormal EEG due to: No awake background.  Continuously suppressed background. There was no electrographic seizure activity or epileptiform discharges. Clinical Correlation This EEG is consistent with a severe encephalopathy but is nonspecific as to etiology. Continuous generalized suppression with lack of reactivity can be seen in the presence of sedative medications. No EEG activity was detected, but this EEG was not performed using a ECI protocol. There was no seizure activity or epileptiform discharges. Electronically signed by: Lynnae Sandhoff, MD Page: [phone] 07/07/2021, 1:46 PM   ECHOCARDIOGRAM COMPLETE  Result Date: 07/07/2021    ECHOCARDIOGRAM REPORT   Patient Name:   BELLAMIA FERCH Date of Exam: 07/07/2021 Medical Rec #:  [id number]     Height:       65.0 in Accession #:    [id number]    Weight:       213.6 lb Date of Birth:  [birth year]      BSA:          2.034 m Patient Age:    56 years      BP:           145/58 mmHg Patient Gender: F             HR:           94 bpm. Exam Location:  [facility] Procedure: 2D Echo, Color Doppler and Cardiac Doppler Indications:     Cardiac Arrest I46.9  History:         Patient has no prior history of Echocardiogram examinations.                  Risk Factors:Hypertension and Sleep Apnea.  Sonographer:     Sherrie Sport Referring Phys:  [id number] Blessing L R[place] Diagnosing Phys: Serafina Royals MD  Sonographer Comments: Technically challenging study due to limited acoustic windows, no apical window, no subcostal window and echo performed with patient supine and on artificial respirator. IMPRESSIONS  1. Left ventricular ejection fraction, by estimation, is 60 to 65%. The left ventricle has normal function. The left ventricle has no regional wall motion abnormalities. Left ventricular diastolic parameters were normal.  2. Right ventricular systolic function is normal. The right ventricular size is normal.  3. The mitral valve is normal in structure. Trivial  mitral valve regurgitation.  4. The aortic valve is normal in  structure. Aortic valve regurgitation is not visualized. FINDINGS  Left Ventricle: Left ventricular ejection fraction, by estimation, is 60 to 65%. The left ventricle has normal function. The left ventricle has no regional wall motion abnormalities. The left ventricular internal cavity size was normal in size. There is  no left ventricular hypertrophy. Left ventricular diastolic parameters were normal. Right Ventricle: The right ventricular size is normal. No increase in right ventricular wall thickness. Right ventricular systolic function is normal. Left Atrium: Left atrial size was normal in size. Right Atrium: Right atrial size was normal in size. Pericardium: There is no evidence of pericardial effusion. Mitral Valve: The mitral valve is normal in structure. Trivial mitral valve regurgitation. Tricuspid Valve: The tricuspid valve is normal in structure. Tricuspid valve regurgitation is trivial. Aortic Valve: The aortic valve is normal in structure. Aortic valve regurgitation is not visualized. Pulmonic Valve: The pulmonic valve was normal in structure. Pulmonic valve regurgitation is not visualized. Aorta: The aortic root and ascending aorta are structurally normal, with no evidence of dilitation. IAS/Shunts: No atrial level shunt detected by color flow Doppler.  LEFT VENTRICLE PLAX 2D LVIDd:         4.30 cm LVIDs:         2.30 cm LV PW:         1.20 cm LV IVS:        1.20 cm LVOT diam:     2.00 cm LVOT Area:     3.14 cm  LEFT ATRIUM         Index LA diam:    3.70 cm 1.82 cm/m                        PULMONIC VALVE AORTA                 PV Vmax:       0.99 m/s Ao Root diam: 3.17 cm PV Vmean:      69.300 cm/s                       PV VTI:        0.145 m                       PV Peak grad:  3.9 mmHg                       PV Mean grad:  2.5 mmHg   SHUNTS Systemic Diam: 2.00 cm Serafina Royals MD Electronically signed by Serafina Royals MD Signature Date/Time: 07/07/2021/1:21:25 PM    Final     Microbiology Recent  Results (from the past 240 hour(s))  Resp Panel by RT-PCR (Flu A&B, Covid) Nasopharyngeal Swab     Status: None   Collection Time: 07/07/2021  4:06 PM   Specimen: Nasopharyngeal Swab; Nasopharyngeal(NP) swabs in vial transport medium  Result Value Ref Range Status   SARS Coronavirus 2 by RT PCR NEGATIVE NEGATIVE Final    Comment: (NOTE) SARS-CoV-2 target nucleic acids are NOT DETECTED.  The SARS-CoV-2 RNA is generally detectable in upper respiratory specimens during the acute phase of infection. The lowest concentration of SARS-CoV-2 viral copies this assay can detect is 138 copies/mL. A negative result does not preclude SARS-Cov-2 infection and should not be used as the sole basis for treatment or other patient management decisions. A negative  result may occur with  improper specimen collection/handling, submission of specimen other than nasopharyngeal swab, presence of viral mutation(s) within the areas targeted by this assay, and inadequate number of viral copies(<138 copies/mL). A negative result must be combined with clinical observations, patient history, and epidemiological information. The expected result is Negative.  Fact Sheet for Patients:  EntrepreneurPulse.com.au  Fact Sheet for Healthcare Providers:  IncredibleEmployment.be  This test is no t yet approved or cleared by the [place] FDA and  has been authorized for detection and/or diagnosis of SARS-CoV-2 by FDA under an Emergency Use Authorization (EUA). This EUA will remain  in effect (meaning this test can be used) for the duration of the COVID-19 declaration under Section 564(b)(1) of the Act, 21 U.S.C.section 360bbb-3(b)(1), unless the authorization is terminated  or revoked sooner.       Influenza A by PCR NEGATIVE NEGATIVE Final   Influenza B by PCR NEGATIVE NEGATIVE Final    Comment: (NOTE) The Xpert Xpress SARS-CoV-2/FLU/RSV plus assay is intended as an aid in the  diagnosis of influenza from Nasopharyngeal swab specimens and should not be used as a sole basis for treatment. Nasal washings and aspirates are unacceptable for Xpert Xpress SARS-CoV-2/FLU/RSV testing.  Fact Sheet for Patients: EntrepreneurPulse.com.au  Fact Sheet for Healthcare Providers: IncredibleEmployment.be  This test is not yet approved or cleared by the [place] FDA and has been authorized for detection and/or diagnosis of SARS-CoV-2 by FDA under an Emergency Use Authorization (EUA). This EUA will remain in effect (meaning this test can be used) for the duration of the COVID-19 declaration under Section 564(b)(1) of the Act, 21 U.S.C. section 360bbb-3(b)(1), unless the authorization is terminated or revoked.  Performed at [facility], [address] [place], [place] [address]   Gastrointestinal Panel by PCR , Stool     Status: None   Collection Time: 07/07/21 12:25 AM   Specimen: Stool  Result Value Ref Range Status   Campylobacter species NOT DETECTED NOT DETECTED Final   Plesimonas shigelloides NOT DETECTED NOT DETECTED Final   Salmonella species NOT DETECTED NOT DETECTED Final   Yersinia enterocolitica NOT DETECTED NOT DETECTED Final   Vibrio species NOT DETECTED NOT DETECTED Final   Vibrio cholerae NOT DETECTED NOT DETECTED Final   Enteroaggregative E coli (EAEC) NOT DETECTED NOT DETECTED Final   Enteropathogenic E coli (EPEC) NOT DETECTED NOT DETECTED Final   Enterotoxigenic E coli (ETEC) NOT DETECTED NOT DETECTED Final   Shiga like toxin producing E coli (STEC) NOT DETECTED NOT DETECTED Final   Shigella/Enteroinvasive E coli (EIEC) NOT DETECTED NOT DETECTED Final   Cryptosporidium NOT DETECTED NOT DETECTED Final   Cyclospora cayetanensis NOT DETECTED NOT DETECTED Final   Entamoeba histolytica NOT DETECTED NOT DETECTED Final   Giardia lamblia NOT DETECTED NOT DETECTED Final   Adenovirus F40/41 NOT DETECTED NOT  DETECTED Final   Astrovirus NOT DETECTED NOT DETECTED Final   Norovirus GI/GII NOT DETECTED NOT DETECTED Final   Rotavirus A NOT DETECTED NOT DETECTED Final   Sapovirus (I, II, IV, and V) NOT DETECTED NOT DETECTED Final    Comment: Performed at [facility], [address] [place], [place] [address]  C Difficile Quick Screen w PCR reflex     Status: None   Collection Time: 07/07/21 12:25 AM   Specimen: STOOL  Result Value Ref Range Status   C Diff antigen NEGATIVE NEGATIVE Final   C Diff toxin NEGATIVE NEGATIVE Final   C Diff interpretation NEGATIVE  Final    Comment: Performed at [facility], [address] [place], [place] [address]  MRSA Next Gen by PCR, Nasal     Status: None   Collection Time: 07/07/21 12:31 AM   Specimen: Nasal Mucosa; Nasal Swab  Result Value Ref Range Status   MRSA by PCR Next Gen NOT DETECTED NOT DETECTED Final    Comment: (NOTE) The GeneXpert MRSA Assay (FDA approved for NASAL specimens only), is one component of a comprehensive MRSA colonization surveillance program. It is not intended to diagnose MRSA infection nor to guide or monitor treatment for MRSA infections. Test performance is not FDA approved in patients less than 63 years old. Performed at [facility], [address] [place], [place] [address]     Lab Basic Metabolic Panel: Recent Labs  Lab 07/05/2021 1606 07/09/2021 1637 07/11/2021 2145 07/07/21 0016 07/07/21 0448 07/07/21 0730 07/08/21 0309 07/09/21 0445  NA  --    < > 132* 137 138 138 140 142  K  --    < > 4.2 3.5 4.1 3.8 3.7 3.5  CL  --    < > 102 107 107 109 110 113*  CO2  --    < > 21* _0 21*  GLUCOSE  --    < > 412* 225* 142* 214* 197* 97  BUN  --    < > 30* 27* 24* 23* 24* 26*  CREATININE  --    < > 1.07* 0.88 0.65 0.59 0.64 0.54  CALCIUM  --    < > 8.1* 7.6* 7.6* 7.4* 7.8* 7.8*  MG 4.9*  --   --   --  4.3*  --  3.1*  --   PHOS  --   --  4.0  --  2.7  --  3.5  --    < > = values in  this interval not displayed.   Liver Function Tests: Recent Labs  Lab 07/08/2021 1543 07/07/21 0448 07/08/21 0309 07/09/21 0445  AST 78* 714* 1,215* 1,344*  ALT 44 489* 885* 1,082*  ALKPHOS 312* 209* 164* 185*  BILITOT 0.7 0.3 0.5 0.9  PROT 7.4 5.7* 5.9* 6.4*  ALBUMIN 3.7 3.1* 3.0* 2.9*   Recent Labs  Lab 07/07/2021 1606 07/07/21 0448  LIPASE 96* 34  AMYLASE  --  63   Recent Labs  Lab 06/28/2021 1606 07/07/21 0448  AMMONIA 96* 47*   CBC: Recent Labs  Lab 07/01/2021 1543 07/07/21 0448 07/08/21 0309 07/09/21 0445  WBC 12.0* 10.2 9.4 11.9*  NEUTROABS 3.8  --   --   --   HGB 12.3 11.4* 10.3* 11.0*  HCT 44.6 37.0 33.7* 36.2  MCV 99.8 90.0 91.6 89.6  PLT 203 127* 119* 138*   Cardiac Enzymes: No results for input(s): CKTOTAL, CKMB, CKMBINDEX, TROPONINI in the last 168 hours. Sepsis Labs: Recent Labs  Lab 06/28/2021 1543 07/09/2021 1606 07/04/2021 1811 07/07/21 0008 07/07/21 0448 07/08/21 0309 07/09/21 0445  PROCALCITON  --  0.17  --   --  15.52 8.60  --   WBC 12.0*  --   --   --  10.2 9.4 11.9*  LATICACIDVEN >9.0*  --  7.8* 3.5* 2.3*  --   --     Procedures/Operations  02/12: Arterial Line placement 02/12: Central Line Placement     Rufina Falco, [place], CCRN, FNP-C, AGACNP-BC Acute Care Nurse Practitioner  [facility] Pager: [phone] [place] at [facility]

## 2021-07-23 NOTE — Progress Notes (Signed)
One way extubation completed per MD order

## 2021-07-23 NOTE — Progress Notes (Signed)
Patient noted asystole on the monitor. She was surrounded by her family. No other vitals noted. No chest rise and fall noted. No heart sounds auscultated. No response to any type of stimuli. Time of death noted at 07-30-2052.

## 2021-07-23 NOTE — Progress Notes (Signed)
°   07-27-2021 1400  Clinical Encounter Type  Visited With Patient and family together  Visit Type Follow-up;Death  Referral From Family;Nurse  Spiritual Encounters  Spiritual Needs Prayer;Ritual;Grief support  Stress Factors  Family Stress Factors Loss   Chaplain was invited to be with family as patient was removed from life support. Chaplain remained with family providing support through compassionate presence, meaningful reflection and life review, ritual, and prayers.

## 2021-07-23 NOTE — Progress Notes (Signed)
NAME:  Kim Hicks, MRN:  [id number], DOB:  24-Apr-1966, LOS: 4 ADMISSION DATE:  06/25/2021 56 yo F presenting to [facility] ED from home with her husband via EMS as CPR in progress. (History and events gathered from ED documentation & husband, [name], at bedside) Patient was in her normal state of health until waking up on the morning of 06/25/2021 with significant abdominal distension & LUQ discomfort. She began having dyspnea and her husband stepped out of the room to get a fan at which point he heard her collapse on the living room floor. EMS was called finding the patient down for about 10 minutes when they arrived and initial rhythm was asystole, CPR initiated around 14:45 per EMS report. King airway placed in field. In transport ROSC was achieved briefly before patient again pulseless in asystole.  Patient arrived CPR in progress around 15:30. Transport was estimated at about 45 minutes. ED course: ROSC documented at 15:40 shortly after arrival. Patient emergently intubated requiring mechanical ventilatory support. Initial work up revealed DKA with acidosis & CBG > 700, septic protocol initiated with initial lactic >9. Patient noted to have severe abdominal distention firm to touch. CT imaging showing toxic megacolon. Dr. Lysle Pearl from general surgery assessed the patient bedside and attempted manual decompression with minor effect; however patient too unstable for surgery.  Medications given: Unasyn, 2 L NS bolus, insulin drip & levophed drip started with continuous IVF Initial Vitals: afebrile 96, tachypneic 35, NSR 82, BP 98/66 & Spo2 88% on king airway Significant labs: (Labs/ Imaging personally reviewed) I, Domingo Pulse [place], AGACNP-BC, personally viewed and interpreted this ECG. EKG Interpretation: Date: 06/28/2021, EKG Time: 15:52, Rate: 96, Rhythm: 1st degree HB-however abnormal P wave-not consistent/ectopic?, QRS Axis:  LAD Intervals: prolonged PR interval, mildly prolonged QTc, ST/T Wave  abnormalities: non specific T wave inversions, mild STE in III only, Narrative Interpretation: 1st degree HB with abnormal P waves- ectopic? And mildly prolonged QTc Chemistry: Na+:130, K+: 4.8, Cl: 95, BUN/Cr.: 29/1.39, Serum CO2/ AG: 17/18, Mg: 4.9, Lipase: 96, alk phos: 312, AST: 78, ammonia: 96 Hematology: WBC: 12, Hgb: 12.3,  Troponin: 39 > 27, Lactic/ PCT: >9-7.8/ 0.17, COVID-19 & Influenza A/B: negative VBG: 7.17/ 53/ 50/ 19.3 >> 7.20/ 53/ 56/ 20.7 CXR 07/14/2021: no acute cardiopulmonary process. Atelectasis in mid & lower lungs with low lung volumes CT head wo contrast 07/05/2021: no intracranial abnormalities CTa chest 07/20/2021: negative for PE. RLL consolidation, query multiple acute minimally displaced rib fractures, multiple subacute/chronic bilateral rib fractures. Acute minimally displaced sternal fracture.  CT abdomen/pelvis w contrast 06/27/2021: Marked gas & fluid dilatation of the large bowel measuring up to 10 cm. No transition point identified. Ogilvie syndrome cannot be fully excluded. Cirrhosis with portal hypertensionand marked hepatic parenchyma heterogeneity suggestive of underlying mass lesion. Cholelithiasis. MRI BRAIN 2/13 HIE Of note in chart review patient had recent elective ERCP for biliary stent removal/exchange for previous biliary leak without complication.   PCCM consulted for admission in the setting of out of hospital cardiac arrest requiring mechanical ventilatory support and toxic megacolon.   Pertinent  Medical History  NASH cirrhosis Type 2 Diabetes Mellitus Pancreatitis (06/2020) Iron deficiency anemia Cholecystectomy Chronic constipation HTN OSA on oxygen overnight (unclear how much/what kind per husband) HLD LEFT foot fracture Anxiety & depression IBS   Significant Hospital Events: Including procedures, antibiotic start and stop dates in addition to other pertinent events   07/05/2021: Admit to ICU with toxic megacolon s/p out of hospital cardiac arrest  in  DKA on mechanical ventilatory support and pressors 2/13 remains on vent, shock, on insulin, MRI abnormal for HIE 2/14 failed weaning trials severe brain damage 2/16: Plan to transition to Comfort care measures only   Antimicrobials:   Antibiotics Given (last 72 hours)     Date/Time Action Medication Dose Rate   07/07/21 2305 New Bag/Given   piperacillin-tazobactam (ZOSYN) IVPB 3.375 g 3.375 g 12.5 mL/hr   07/08/21 0524 New Bag/Given   piperacillin-tazobactam (ZOSYN) IVPB 3.375 g 3.375 g 12.5 mL/hr   07/08/21 1426 New Bag/Given   piperacillin-tazobactam (ZOSYN) IVPB 3.375 g 3.375 g 12.5 mL/hr   07/08/21 2139 New Bag/Given   piperacillin-tazobactam (ZOSYN) IVPB 3.375 g 3.375 g 12.5 mL/hr   07/09/21 0530 New Bag/Given   piperacillin-tazobactam (ZOSYN) IVPB 3.375 g 3.375 g 12.5 mL/hr            Interim History / Subjective:  MRI c/w HIE,  Neuro exam remains unchanged, concerning for signs of axonic brain injury Plan to transition to comfort measures only today with Palliative Care    Objective   Blood pressure (!) 141/68, pulse 95, temperature 98.6 F (37 C), temperature source Esophageal, resp. rate (!) 22, height 5' 5"  (1.651 m), weight 102.6 kg, SpO2 (!) 1 %. CVP:  [5 mmHg-17 mmHg] 17 mmHg  Vent Mode: PRVC FiO2 (%):  [30 %] 30 % Set Rate:  [22 bmp] 22 bmp Vt Set:  [450 mL] 450 mL PEEP:  [5 cmH20] 5 cmH20 Plateau Pressure:  [18 cmH20-24 cmH20] 24 cmH20   Intake/Output Summary (Last 24 hours) at 04-Aug-2021 1516 Last data filed at Aug 04, 2021 1350 Gross per 24 hour  Intake 2496.47 ml  Output 1600 ml  Net 896.47 ml    Filed Weights   07/07/21 0452 07/08/21 0412 07/09/21 0500  Weight: 96.9 kg 102.3 kg 102.6 kg    REVIEW OF SYSTEMS  PATIENT IS UNABLE TO PROVIDE COMPLETE REVIEW OF SYSTEMS DUE TO SEVERE CRITICAL ILLNESS AND TOXIC METABOLIC ENCEPHALOPATHY    PHYSICAL EXAMINATION:  GENERAL:critically ill appearing female, intubated, on versed infusion, in  NAD EYES: Pupils equal, round, reactive to light.  No scleral icterus.  MOUTH: Moist mucosal membrane. Orally INTUBATED NECK: Supple. No JVD PULMONARY: Coarse breath sounds bilaterally, even, occasionally overbreathes the vent CARDIOVASCULAR: Regular rate and rhythm, S1 and S2.  No M/R/G GASTROINTESTINAL: Soft, nontender, nondistended. Positive bowel sounds.  MUSCULOSKELETAL: No swelling, clubbing, or edema.  NEUROLOGIC: does have some brain stem reflexes (+ cough/gag/pupils), but no purposeful movement elicited SKIN:intact,warm,dry   Labs/imaging that I havepersonally reviewed  (right click and "Reselect all SmartList Selections" daily)      ASSESSMENT AND PLAN SYNOPSIS  56 yo white female with acute and severe resp failure with acute sudden cardiac arrest due leading to acute and severe hypoxic pres failure due to demand ischemia from severe metabolic acidosis with DKA with underlying toxic megacolon and aspiration pneumonia with severe anoxic brain injury/damage  Acute Hypoxic & Hypercapnic Respiratory Failure -Full vent support, implement lung protective strategies -Plateau pressures less than 30 cm H20 -Wean FiO2 & PEEP as tolerated to maintain O2 sats >92% -Follow intermittent Chest X-ray & ABG as needed -Spontaneous Breathing Trials when respiratory parameters met and mental status permits -Implement VAP Bundle -Prn Bronchodilators  Out of Hospital Cardiac Arrest -Continuous cardiac monitoring -Maintain MAP >65 -Vasopressors as needed to maintain MAP goal -Trend lactic acid until normalized -Trend HS Troponin until peaked -Echocardiogram pending  Elevated LFT's, suspect shock liver s/p cardiac arrest -Trend LFT's  and bleeding times  Acute Metabolic Encephalopathy Concern for severe anoxic brain injury s/p cardiac arrest Myoclonic Activity -Maintain a RASS goal of 0 to -1 -Versed as needed to maintain RASS goal -Avoid sedating medications as able -Daily wake up  assessment -Was evaluated by Neurology -EEG with continuous generalized suppression consistent with a severe encephalopathy  -MRI Brain consistent with anoxic brain injury -Continue Keppra for presumed myoclonic activity         Best practice (right click and "Reselect all SmartList Selections" daily)  Diet:  NPO Pain/Anxiety/Delirium protocol (if indicated): Yes (RASS goal 0) VAP protocol (if indicated): Yes DVT prophylaxis: Subcutaneous Heparin GI prophylaxis: PPI Glucose control:  SSI Central venous access:  Yes, and it is still needed Arterial line:  Yes, and it is still needed Foley:  Yes, and it is still needed Mobility:  bed rest  Code Status:  DNR Disposition: ICU  Labs   CBC: Recent Labs  Lab 07/08/2021 1543 07/07/21 0448 07/08/21 0309 07/09/21 0445  WBC 12.0* 10.2 9.4 11.9*  NEUTROABS 3.8  --   --   --   HGB 12.3 11.4* 10.3* 11.0*  HCT 44.6 37.0 33.7* 36.2  MCV 99.8 90.0 91.6 89.6  PLT 203 127* 119* 138*     Basic Metabolic Panel: Recent Labs  Lab 07/20/2021 1606 07/16/2021 1637 07/21/2021 2145 07/07/21 0016 07/07/21 0448 07/07/21 0730 07/08/21 0309 07/09/21 0445  NA  --    < > 132* 137 138 138 140 142  K  --    < > 4.2 3.5 4.1 3.8 3.7 3.5  CL  --    < > 102 107 107 109 110 113*  CO2  --    < > 21* 24 24 24 24  21*  GLUCOSE  --    < > 412* 225* 142* 214* 197* 97  BUN  --    < > 30* 27* 24* 23* 24* 26*  CREATININE  --    < > 1.07* 0.88 0.65 0.59 0.64 0.54  CALCIUM  --    < > 8.1* 7.6* 7.6* 7.4* 7.8* 7.8*  MG 4.9*  --   --   --  4.3*  --  3.1*  --   PHOS  --   --  4.0  --  2.7  --  3.5  --    < > = values in this interval not displayed.    GFR: Estimated Creatinine Clearance: 93.2 mL/min (by C-G formula based on SCr of 0.54 mg/dL). Recent Labs  Lab 06/28/2021 1543 07/05/2021 1606 06/28/2021 1811 07/07/21 0008 07/07/21 0448 07/08/21 0309 07/09/21 0445  PROCALCITON  --  0.17  --   --  15.52 8.60  --   WBC 12.0*  --   --   --  10.2 9.4 11.9*   LATICACIDVEN >9.0*  --  7.8* 3.5* 2.3*  --   --      Liver Function Tests: Recent Labs  Lab 07/11/2021 1543 07/07/21 0448 07/08/21 0309 07/09/21 0445  AST 78* 714* 1,215* 1,344*  ALT 44 489* 885* 1,082*  ALKPHOS 312* 209* 164* 185*  BILITOT 0.7 0.3 0.5 0.9  PROT 7.4 5.7* 5.9* 6.4*  ALBUMIN 3.7 3.1* 3.0* 2.9*    Recent Labs  Lab 06/29/2021 1606 07/07/21 0448  LIPASE 96* 34  AMYLASE  --  63    Recent Labs  Lab 07/03/2021 1606 07/07/21 0448  AMMONIA 96* 47*     ABG    Component Value Date/Time  PHART 7.37 07/07/2021 0000   PCO2ART 43 07/07/2021 0000   PO2ART 200 (H) 07/07/2021 0000   HCO3 24.9 07/07/2021 0000   ACIDBASEDEF 0.5 07/07/2021 0000   O2SAT 99.7 07/07/2021 0000      Coagulation Profile: Recent Labs  Lab 06/27/2021 1543  INR 1.2     Cardiac Enzymes: No results for input(s): CKTOTAL, CKMB, CKMBINDEX, TROPONINI in the last 168 hours.  HbA1C: Hgb A1c MFr Bld  Date/Time Value Ref Range Status  07/07/2021 05:00 AM 9.9 (H) 4.8 - 5.6 % Final    Comment:    (NOTE) Pre diabetes:          5.7%-6.4%  Diabetes:              >6.4%  Glycemic control for   <7.0% adults with diabetes   09/30/2020 04:22 PM 9.5 (H) 4.8 - 5.6 % Final    Comment:    (NOTE) Pre diabetes:          5.7%-6.4%  Diabetes:              >6.4%  Glycemic control for   <7.0% adults with diabetes     CBG: Recent Labs  Lab 07/09/21 1925 07/09/21 2133 2021/07/31 0007 Jul 31, 2021 0325 Jul 31, 2021 0729  GLUCAP 84 92 87 82 88     Allergies Allergies  Allergen Reactions   Dilaudid [Hydromorphone] Hives   Talwin [Pentazocine]     Turned blue around lips and face, rash all over body       DVT/GI PRX  assessed I Assessed the need for Labs I Assessed the need for Foley I Assessed the need for Central Venous Line Family Discussion when available I Assessed the need for Mobilization I made an Assessment of medications to be adjusted accordingly Safety Risk assessment  completed  [place] ICU TEAM   DNR STATUS END OF LIFE VISITATION [place] Time devoted to patient care services described in this note is 45 minutes.  Critical care was necessary to treat /prevent imminent and life-threatening deterioration. Overall, patient is critically ill, prognosis is guarded.  Patient with Multiorgan failure and at high risk for cardiac arrest and death.    Darel Hong, AGACNP-BC [facility] Prefer epic messenger for cross cover needs If after hours, please call E-link

## 2021-07-23 DEATH — deceased

## 2021-07-31 ENCOUNTER — Other Ambulatory Visit (HOSPITAL_COMMUNITY): Payer: Medicare Other

## 2022-02-06 IMAGING — CR DG CHEST 2V
2 series · 2 of 2 positions shown · non-contrast
Comparison: August 05, 2019

CLINICAL DATA: Chest pain

EXAM:
CHEST - 2 VIEW

[w chest pa]
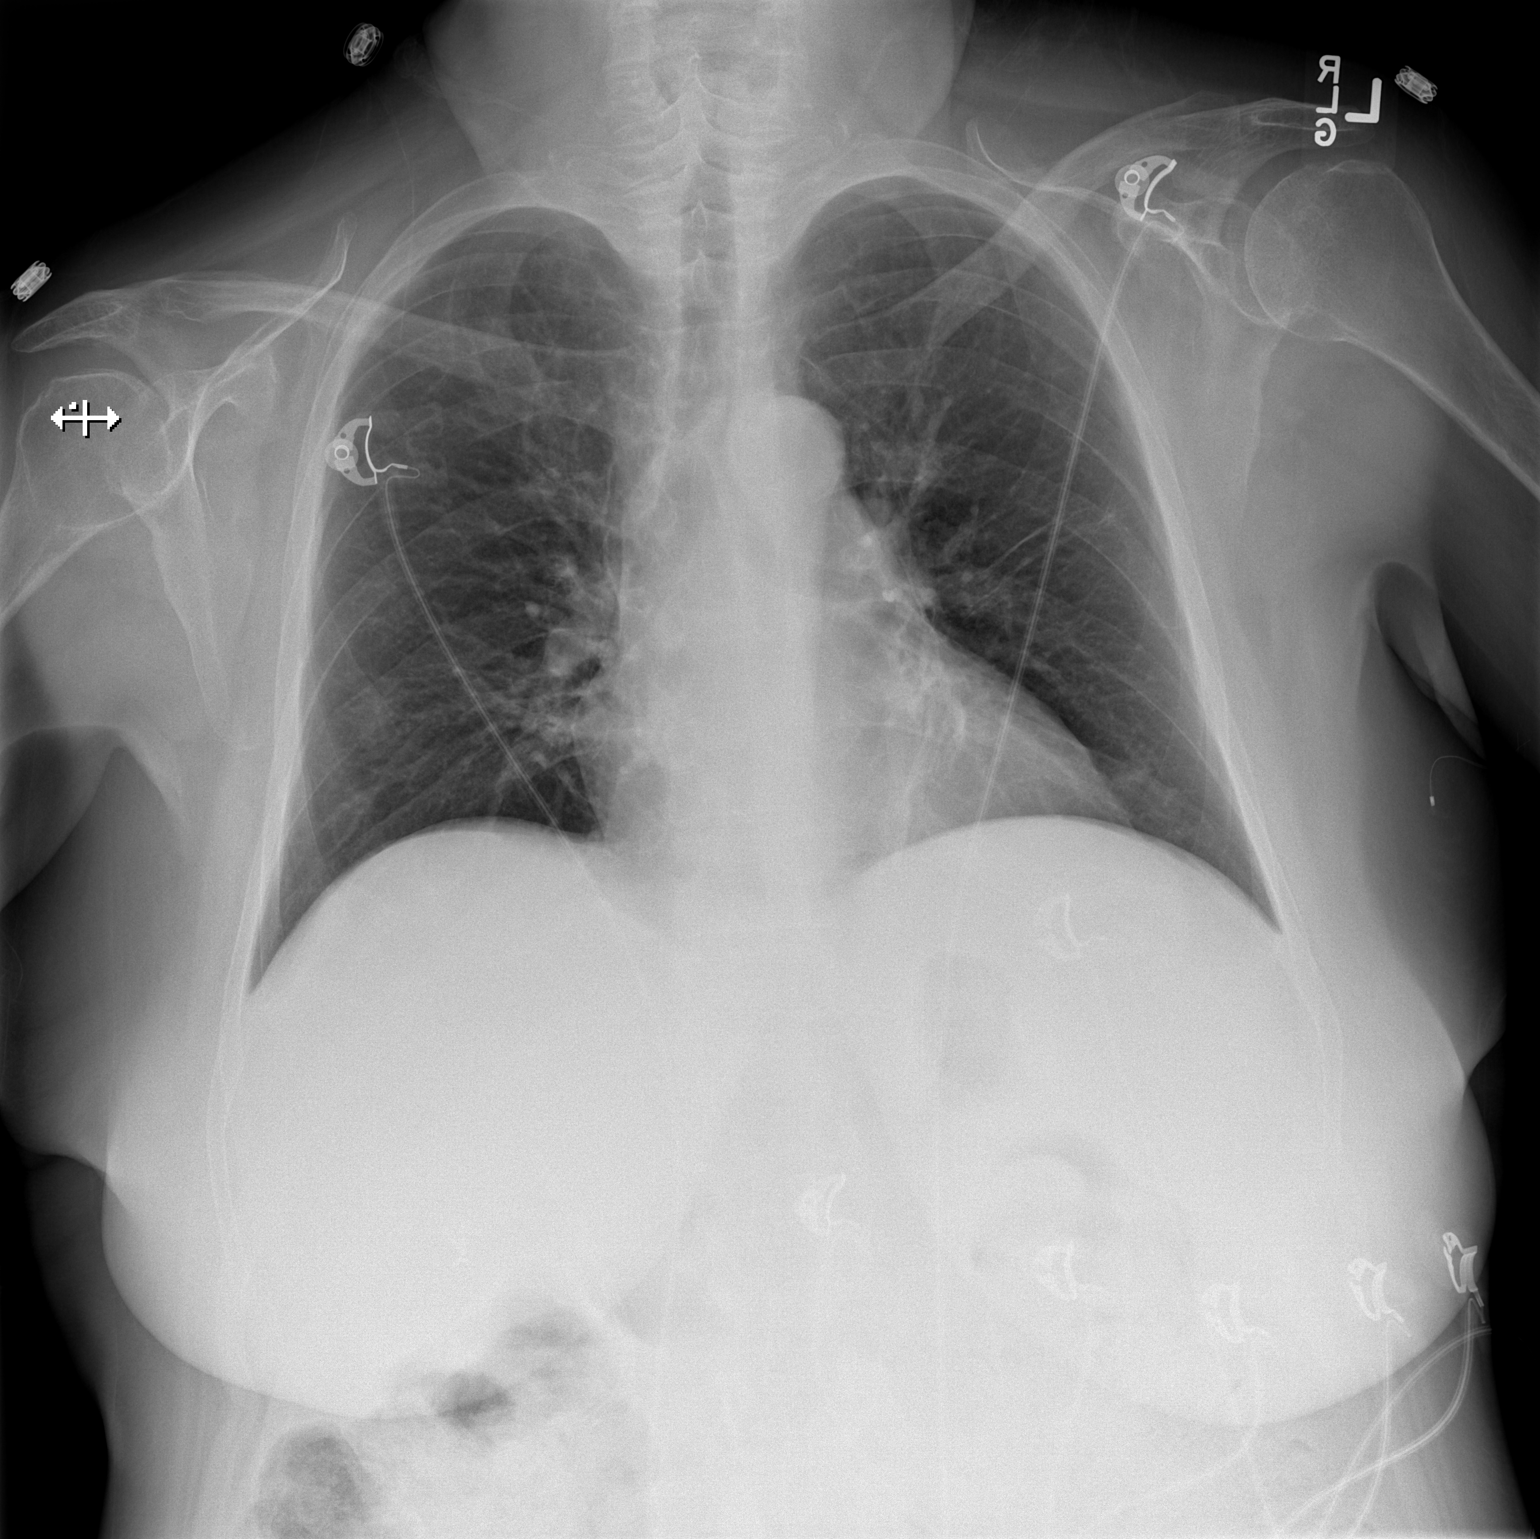

[w chest lat]
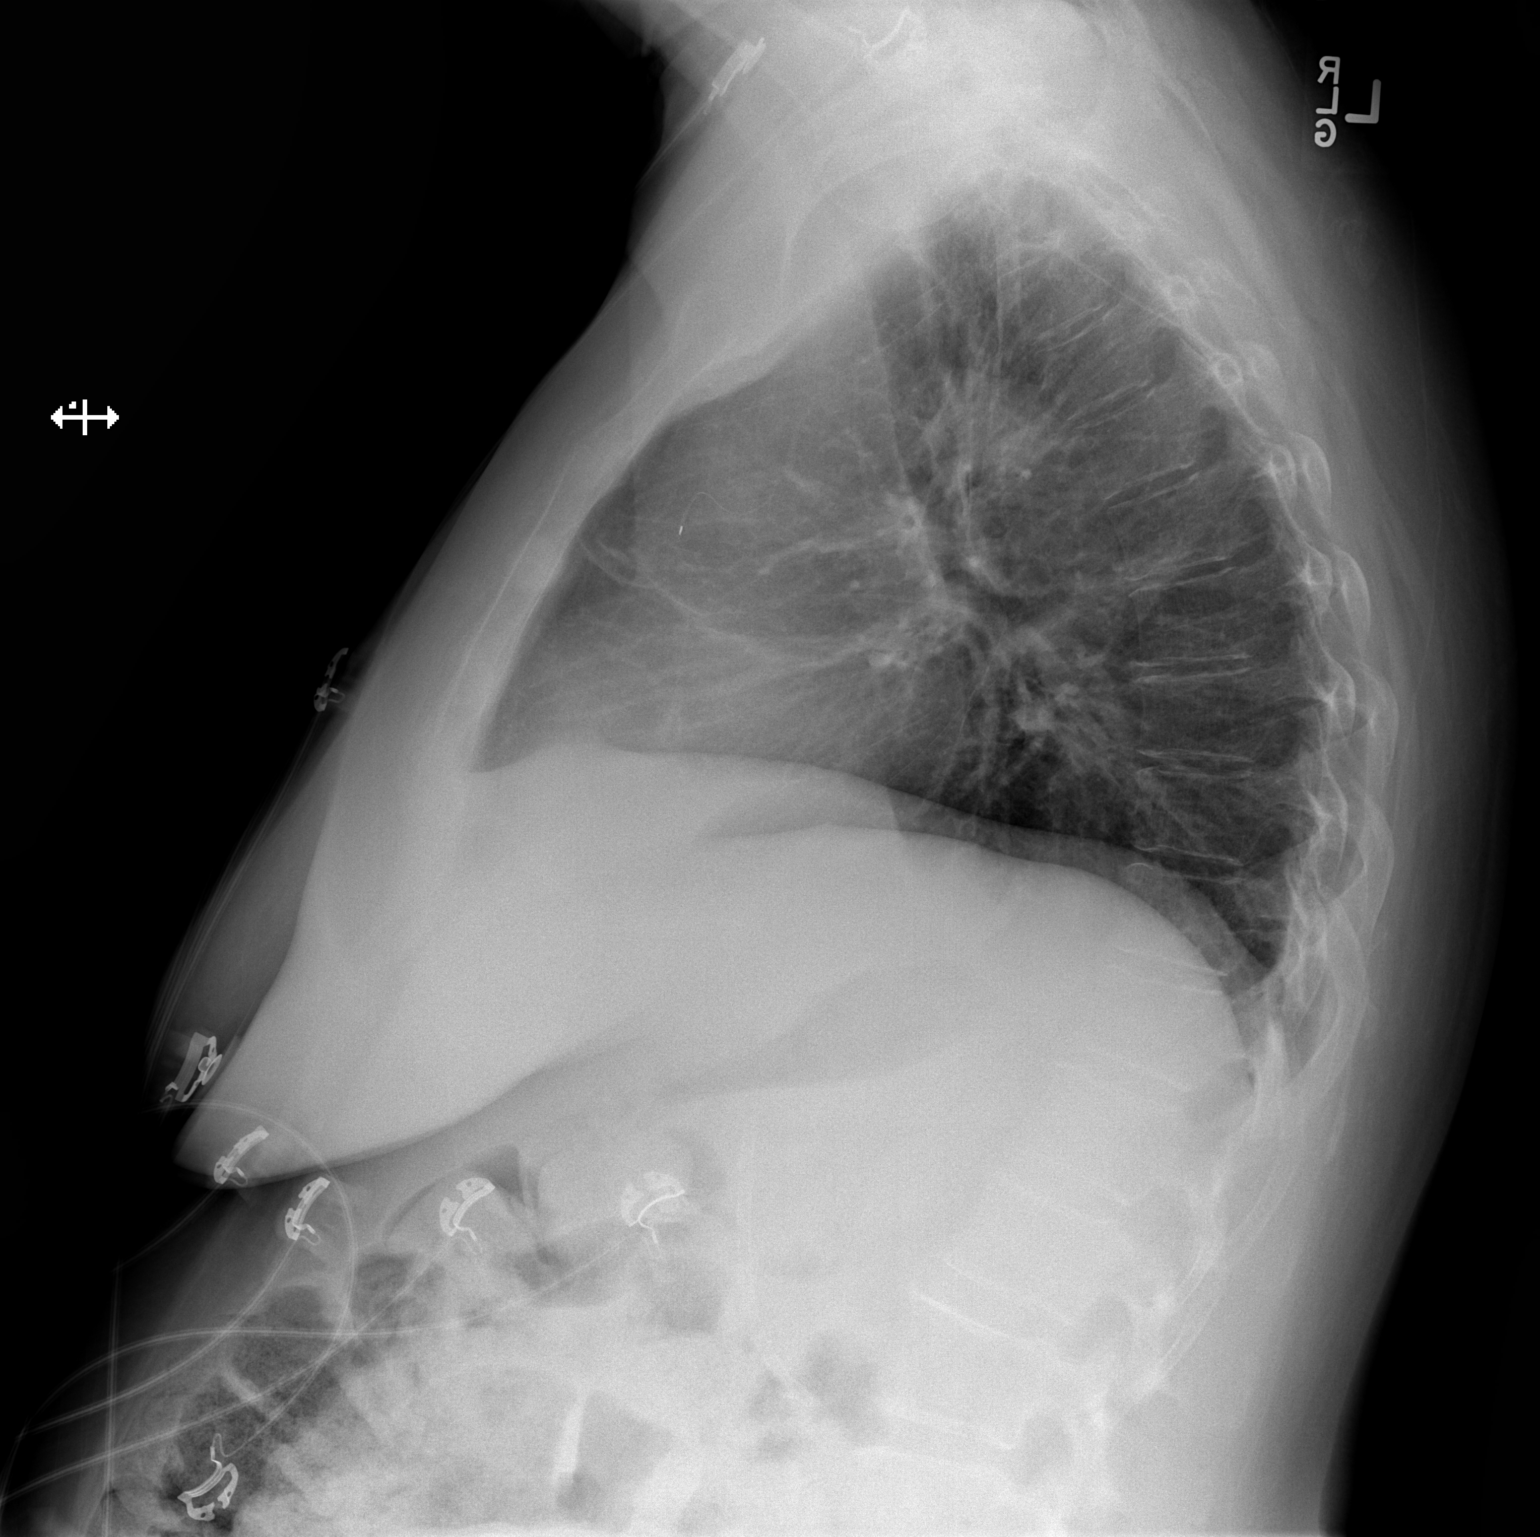

[2 of 2 positions shown; findings below may reference images not displayed]

FINDINGS: There is mild atelectasis in the lingula. Lungs elsewhere are clear.
Heart size and pulmonary vascularity are normal. No adenopathy. No
pneumothorax. No bone lesions.
IMPRESSION: Mild lingular atelectasis. No edema or airspace opacity. Cardiac
silhouette normal. No adenopathy.

## 2022-02-06 IMAGING — CT CT ANGIO CHEST
2 of 6 series · 16 of 46 positions shown · IV contrast (OMNIPAQUE 350)
Comparison: Report of [facility] Excell chest CT 12/21/2008 (no
images available). CT Abdomen and Pelvis 01/04/2009

CLINICAL DATA: 53-year-old female with chest and upper abdominal
pain status post biliary stent exchange today. History of bile leak,
cholecystectomy.

EXAM:
CT ANGIOGRAPHY CHEST
CT ABDOMEN AND PELVIS WITH CONTRAST
TECHNIQUE: Multidetector CT imaging of the chest was performed using the
standard protocol during bolus administration of intravenous
contrast. Multiplanar CT image reconstructions and MIPs were
obtained to evaluate the vascular anatomy. Multidetector CT imaging
of the abdomen and pelvis was performed using the standard protocol
during bolus administration of intravenous contrast.
CONTRAST:  100mL OMNIPAQUE IOHEXOL 350 MG/ML SOLN

[Series 3: thins · axial · 0.69mm/px · z∈[-228,-9]mm · 13 of 241 slices shown]
[im 11/241  lung]
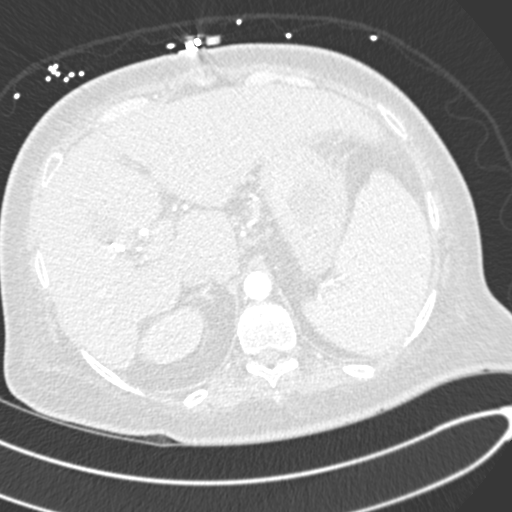
[im 32/241  soft-tissue]
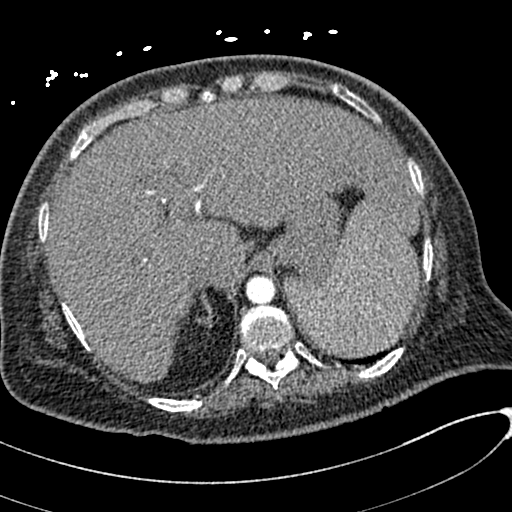
[im 53/241  lung]
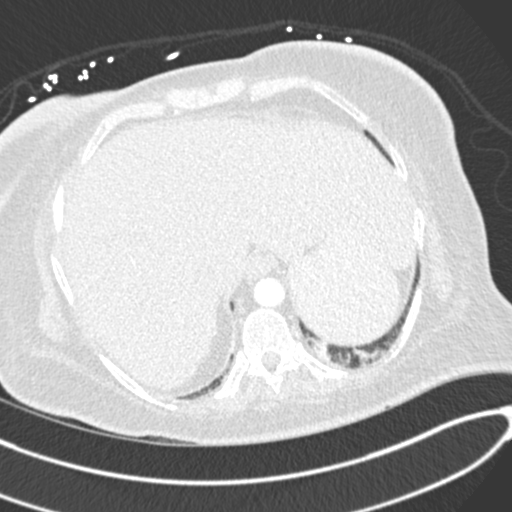
[im 63/241  soft-tissue]
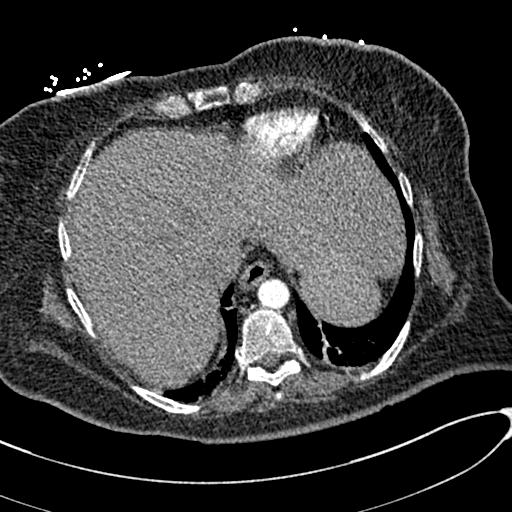
[im 84/241  lung]
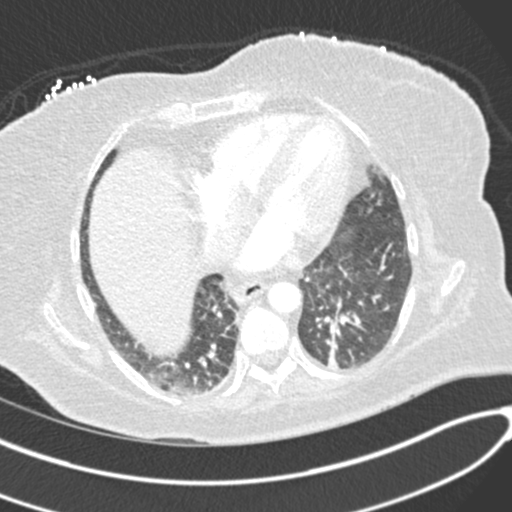
[im 105/241  soft-tissue]
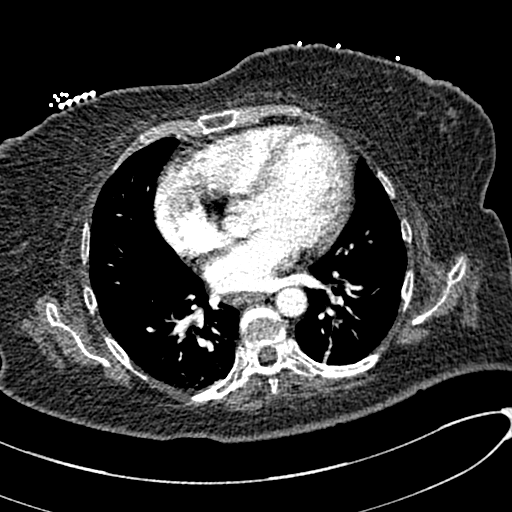
[im 126/241  lung]
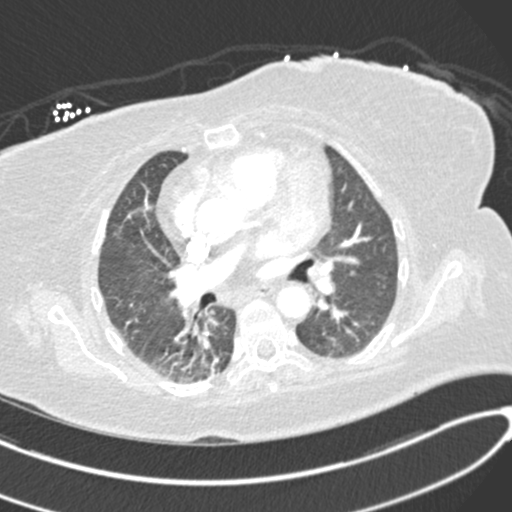
[im 136/241  soft-tissue]
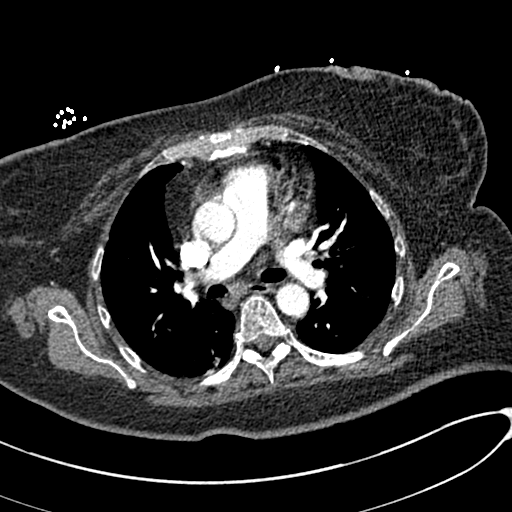
[im 157/241  lung]
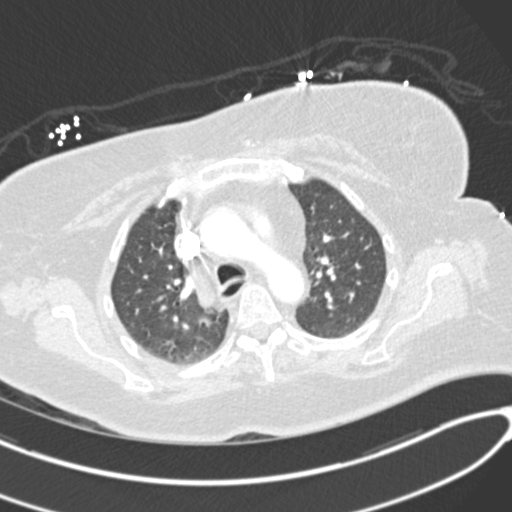
[im 178/241  soft-tissue]
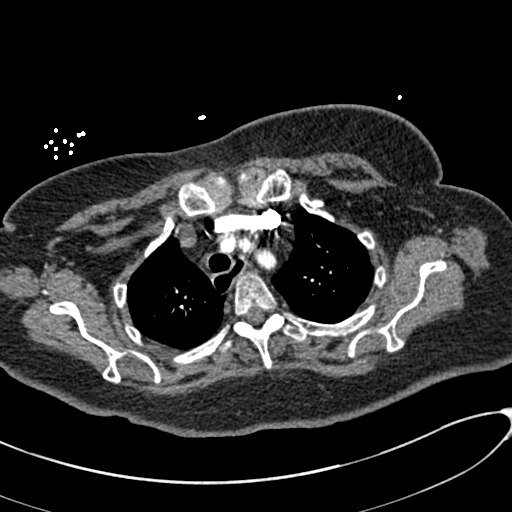
[im 188/241  lung]
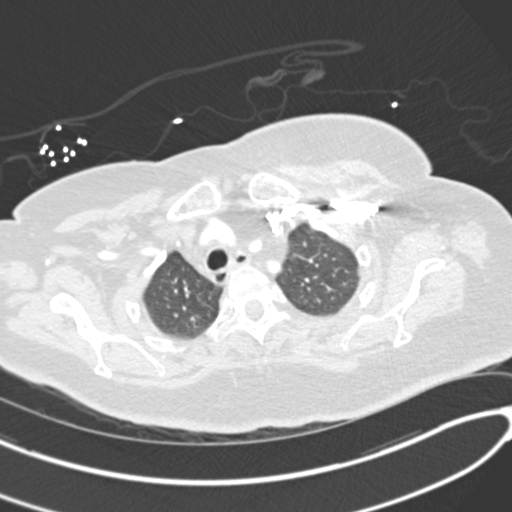
[im 209/241  soft-tissue]
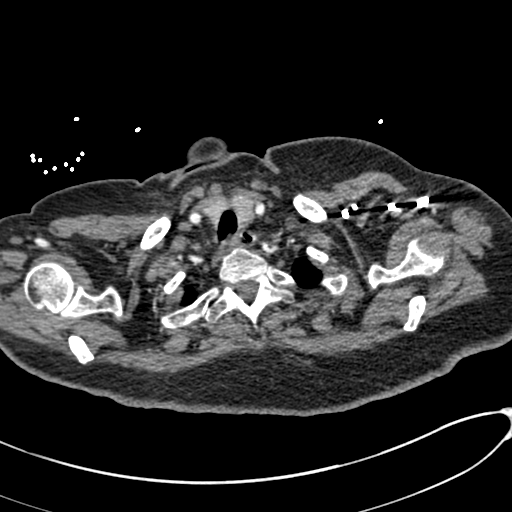
[im 230/241  lung]
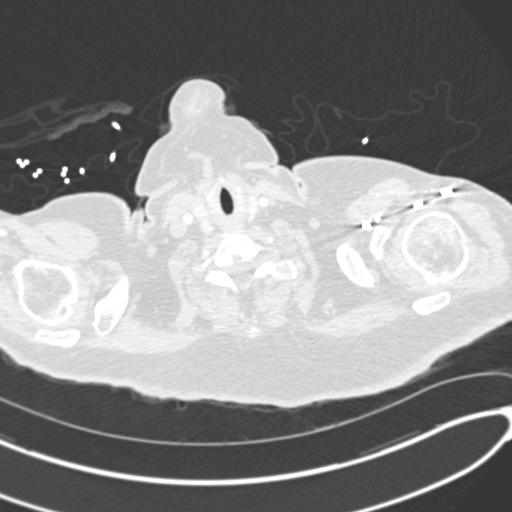

[Series 4: coronal mpr · coronal · 0.49mm/px · 3 of 148 slices shown]
[im 37/148  soft-tissue]
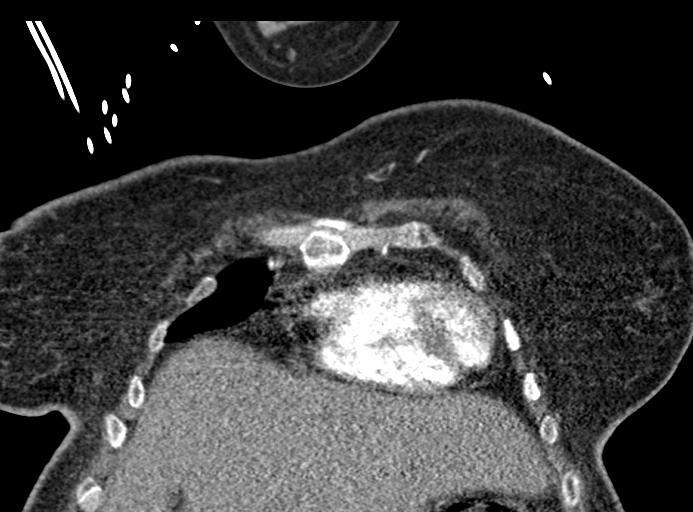
[im 74/148  soft-tissue]
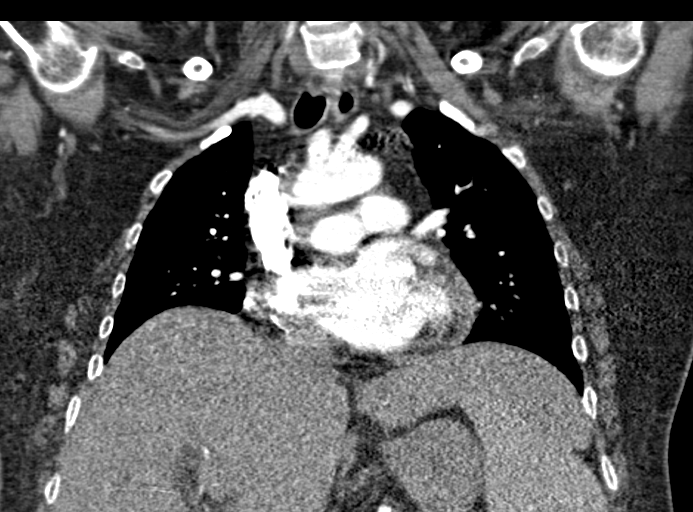
[im 111/148  soft-tissue]
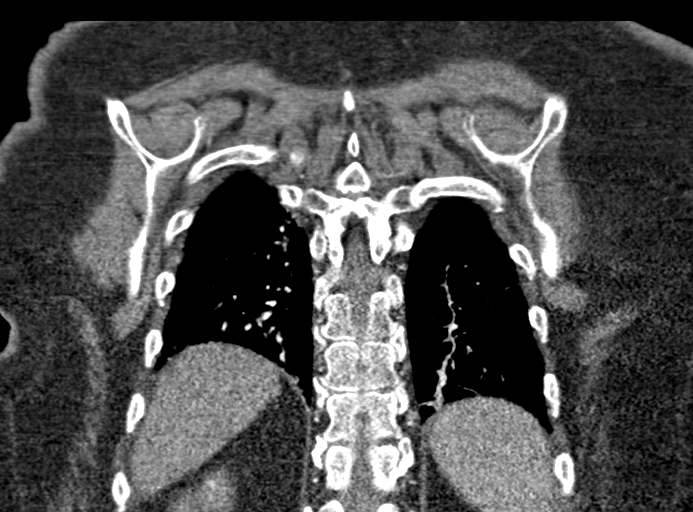

[16 of 46 positions shown; findings below may reference images not displayed]

FINDINGS: CTA CHEST FINDINGS

Cardiovascular: Adequate contrast bolus timing in the pulmonary
arterial tree.

No focal filling defect identified in the pulmonary arteries to
suggest acute pulmonary embolism.

Mild cardiomegaly. No pericardial effusion. Negative thoracic aorta.
No calcified coronary artery atherosclerosis is evident.

Mediastinum/Nodes: Mildly gas distended esophagus, otherwise
negative. No lymphadenopathy.

Lungs/Pleura: Major airways are patent. Mild dependent and
subsegmental atelectasis, most pronounced in the left lower and
right middle lobes. No pleural effusion.

Musculoskeletal: Osteopenia. In the lower lateral ribs there is a
minimally displaced acute appearing 8th rib fracture on series 8,
image 100. But there are also subacute to chronic appearing
fractures of the right lateral 3rd through 6th ribs, as well as the
anterior right 9th and posterior 12th ribs. Thoracic vertebrae
appear intact.

Review of the MIP images confirms the above findings.

CT ABDOMEN and PELVIS FINDINGS

Hepatobiliary: Surgical clips in the gallbladder fossa with small
volume mildly complex density fluid there (series 4, image 22).
Trace gas as well. Superimposed CBD stent which terminates in the
duodenum, no adverse features identified. No intrahepatic biliary
ductal dilatation or definite periportal edema. Liver enhancement
remains within normal limits.

Pancreas: Negative.

Spleen: Stable borderline to mild splenomegaly since 4505.

Adrenals/Urinary Tract: Negative adrenal glands. Bilateral renal
enhancement and contrast excretion is symmetric and normal.
Decompressed ureters. Mildly distended urinary bladder with
estimated bladder volume 300 mL.

Stomach/Bowel: Redundant large bowel with retained stool throughout.
No large bowel inflammation identified. Normal appendix visible on
series 4, image 60.

There is trace fluid in the left pericolic gutter on series 4, image
51.

The cecum is on a lax mesentery. Negative terminal ileum. No dilated
small bowel.

Decompressed stomach. No free air. Biliary stent terminates in the
2nd portion of the duodenum on series 4, image 34. No a duodenal
inflammation is evident. The duodenum extends to the midline but
the proximal small bowel loops are then in the right upper quadrant,
which is a change from 4505. But there is no significant swirling of
the mesentery. No inflamed or dilated small bowel. No other free
fluid.

Vascular/Lymphatic: Major arterial structures are patent with no
atherosclerosis identified. Portal venous system is patent. No
lymphadenopathy.

Reproductive: Surgically absent uterus. Diminutive or absent
ovaries.

Other: No pelvic free fluid.

Musculoskeletal: Right lower rib findings detailed above. Negative
lumbar spine and pelvis.

Review of the MIP images confirms the above findings.
IMPRESSION: 1. Negative for acute pulmonary embolus. Mild pulmonary atelectasis.

2. A minimally displaced fracture of the right lateral 8th rib seems
acute, although there are otherwise non-acute fractures of the right
3rd through 9th ribs.

3. Postoperative changes to the gallbladder fossa with small volume
mildly complex fluid and trace gas. Nearby biliary stent with no
adverse features.

4. Trace fluid in the left pericolic gutter.
No bowel inflammation identified, although there is seemingly midgut
malrotation (proximal small bowel loops in the right upper quadrant)
which is new from a 4505 CT.
Redundant large bowel with retained stool.

5. Mildly distended urinary bladder, 300 mL.
